# Patient Record
Sex: Female | Born: 1950 | Race: Black or African American | Hispanic: No | Smoking: Former smoker
Health system: Southern US, Community
[De-identification: ages and names within clinical notes are randomized; demographics above are authoritative.]

## PROBLEM LIST (undated history)

## (undated) DIAGNOSIS — E785 Hyperlipidemia, unspecified: Secondary | ICD-10-CM

## (undated) DIAGNOSIS — Z9289 Personal history of other medical treatment: Secondary | ICD-10-CM

## (undated) DIAGNOSIS — M545 Low back pain, unspecified: Secondary | ICD-10-CM

## (undated) DIAGNOSIS — I152 Hypertension secondary to endocrine disorders: Secondary | ICD-10-CM

## (undated) DIAGNOSIS — E119 Type 2 diabetes mellitus without complications: Secondary | ICD-10-CM

## (undated) DIAGNOSIS — I2510 Atherosclerotic heart disease of native coronary artery without angina pectoris: Secondary | ICD-10-CM

## (undated) DIAGNOSIS — J189 Pneumonia, unspecified organism: Secondary | ICD-10-CM

## (undated) DIAGNOSIS — I739 Peripheral vascular disease, unspecified: Secondary | ICD-10-CM

## (undated) DIAGNOSIS — Z9861 Coronary angioplasty status: Secondary | ICD-10-CM

## (undated) DIAGNOSIS — F32A Depression, unspecified: Secondary | ICD-10-CM

## (undated) DIAGNOSIS — N184 Chronic kidney disease, stage 4 (severe): Secondary | ICD-10-CM

## (undated) DIAGNOSIS — I5032 Chronic diastolic (congestive) heart failure: Secondary | ICD-10-CM

## (undated) DIAGNOSIS — I96 Gangrene, not elsewhere classified: Secondary | ICD-10-CM

## (undated) DIAGNOSIS — F329 Major depressive disorder, single episode, unspecified: Secondary | ICD-10-CM

## (undated) DIAGNOSIS — I2119 ST elevation (STEMI) myocardial infarction involving other coronary artery of inferior wall: Secondary | ICD-10-CM

## (undated) DIAGNOSIS — Z9981 Dependence on supplemental oxygen: Secondary | ICD-10-CM

## (undated) DIAGNOSIS — I639 Cerebral infarction, unspecified: Secondary | ICD-10-CM

## (undated) DIAGNOSIS — L97509 Non-pressure chronic ulcer of other part of unspecified foot with unspecified severity: Secondary | ICD-10-CM

## (undated) DIAGNOSIS — E1159 Type 2 diabetes mellitus with other circulatory complications: Secondary | ICD-10-CM

## (undated) DIAGNOSIS — M1990 Unspecified osteoarthritis, unspecified site: Secondary | ICD-10-CM

## (undated) DIAGNOSIS — G8929 Other chronic pain: Secondary | ICD-10-CM

## (undated) DIAGNOSIS — E66813 Obesity, class 3: Secondary | ICD-10-CM

## (undated) DIAGNOSIS — E1142 Type 2 diabetes mellitus with diabetic polyneuropathy: Secondary | ICD-10-CM

## (undated) DIAGNOSIS — Z794 Long term (current) use of insulin: Secondary | ICD-10-CM

## (undated) DIAGNOSIS — I10 Essential (primary) hypertension: Secondary | ICD-10-CM

## (undated) DIAGNOSIS — D649 Anemia, unspecified: Secondary | ICD-10-CM

## (undated) DIAGNOSIS — C539 Malignant neoplasm of cervix uteri, unspecified: Secondary | ICD-10-CM

## (undated) DIAGNOSIS — E11621 Type 2 diabetes mellitus with foot ulcer: Secondary | ICD-10-CM

## (undated) HISTORY — DX: Hypertension secondary to endocrine disorders: I15.2

## (undated) HISTORY — DX: Cerebral infarction, unspecified: I63.9

## (undated) HISTORY — DX: Anemia, unspecified: D64.9

## (undated) HISTORY — DX: Obesity, class 3: E66.813

## (undated) HISTORY — DX: Chronic kidney disease, stage 4 (severe): N18.4

## (undated) HISTORY — DX: Hyperlipidemia, unspecified: E78.5

## (undated) HISTORY — DX: ST elevation (STEMI) myocardial infarction involving other coronary artery of inferior wall: I21.19

## (undated) HISTORY — PX: ABDOMINAL HYSTERECTOMY: SHX81

## (undated) HISTORY — PX: EYE SURGERY: SHX253

## (undated) HISTORY — DX: Chronic diastolic (congestive) heart failure: I50.32

## (undated) HISTORY — DX: Morbid (severe) obesity due to excess calories: E66.01

## (undated) HISTORY — DX: Coronary angioplasty status: Z98.61

## (undated) HISTORY — DX: Peripheral vascular disease, unspecified: I73.9

## (undated) HISTORY — DX: Type 2 diabetes mellitus with foot ulcer: E11.621

## (undated) HISTORY — DX: Type 2 diabetes mellitus with foot ulcer: L97.509

## (undated) HISTORY — PX: LEG TENDON SURGERY: SHX1004

## (undated) HISTORY — DX: Atherosclerotic heart disease of native coronary artery without angina pectoris: I25.10

## (undated) HISTORY — PX: LEG SURGERY: SHX1003

## (undated) HISTORY — DX: Essential (primary) hypertension: I10

## (undated) HISTORY — DX: Type 2 diabetes mellitus with other circulatory complications: E11.59

## (undated) HISTORY — DX: Gangrene, not elsewhere classified: I96

## (undated) HISTORY — DX: Type 2 diabetes mellitus with diabetic polyneuropathy: E11.42

## (undated) HISTORY — PX: TRANSTHORACIC ECHOCARDIOGRAM: SHX275

## (undated) HISTORY — DX: Type 2 diabetes mellitus without complications: Z79.4

## (undated) HISTORY — DX: Type 2 diabetes mellitus without complications: E11.9

---
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
## 2009-03-19 DIAGNOSIS — I2119 ST elevation (STEMI) myocardial infarction involving other coronary artery of inferior wall: Secondary | ICD-10-CM

## 2009-03-19 DIAGNOSIS — I2510 Atherosclerotic heart disease of native coronary artery without angina pectoris: Secondary | ICD-10-CM

## 2009-03-19 HISTORY — DX: ST elevation (STEMI) myocardial infarction involving other coronary artery of inferior wall: I21.19

## 2009-03-19 HISTORY — DX: Atherosclerotic heart disease of native coronary artery without angina pectoris: I25.10

## 2009-03-31 ENCOUNTER — Encounter: Payer: Self-pay | Admitting: Cardiovascular Disease

## 2009-03-31 ENCOUNTER — Inpatient Hospital Stay (HOSPITAL_COMMUNITY): Admission: EM | Admit: 2009-03-31 | Discharge: 2009-04-04 | Payer: Self-pay | Admitting: Emergency Medicine

## 2009-03-31 DIAGNOSIS — I2510 Atherosclerotic heart disease of native coronary artery without angina pectoris: Secondary | ICD-10-CM | POA: Insufficient documentation

## 2009-03-31 DIAGNOSIS — I252 Old myocardial infarction: Secondary | ICD-10-CM | POA: Insufficient documentation

## 2009-03-31 HISTORY — PX: CARDIAC CATHETERIZATION: SHX172

## 2009-03-31 HISTORY — PX: PERCUTANEOUS CORONARY STENT INTERVENTION (PCI-S): SHX6016

## 2009-06-08 ENCOUNTER — Emergency Department (HOSPITAL_COMMUNITY): Admission: EM | Admit: 2009-06-08 | Discharge: 2009-06-08 | Payer: Self-pay | Admitting: Emergency Medicine

## 2010-03-14 ENCOUNTER — Emergency Department (HOSPITAL_COMMUNITY): Admission: EM | Admit: 2010-03-14 | Discharge: 2010-03-14 | Payer: Self-pay | Admitting: Family Medicine

## 2010-06-26 ENCOUNTER — Emergency Department (HOSPITAL_COMMUNITY): Admission: EM | Admit: 2010-06-26 | Discharge: 2010-06-26 | Payer: Self-pay | Admitting: Family Medicine

## 2010-08-30 ENCOUNTER — Emergency Department (HOSPITAL_COMMUNITY): Admission: EM | Admit: 2010-08-30 | Discharge: 2010-08-30 | Payer: Self-pay | Admitting: Family Medicine

## 2010-09-01 ENCOUNTER — Observation Stay (HOSPITAL_COMMUNITY): Admission: EM | Admit: 2010-09-01 | Discharge: 2010-09-04 | Payer: Self-pay | Admitting: Emergency Medicine

## 2010-09-01 ENCOUNTER — Encounter: Payer: Self-pay | Admitting: Cardiovascular Disease

## 2010-10-11 ENCOUNTER — Ambulatory Visit
Admission: RE | Admit: 2010-10-11 | Discharge: 2010-10-11 | Payer: Self-pay | Source: Home / Self Care | Admitting: Gynecology

## 2010-10-18 DIAGNOSIS — J4489 Other specified chronic obstructive pulmonary disease: Secondary | ICD-10-CM | POA: Insufficient documentation

## 2010-10-18 DIAGNOSIS — N390 Urinary tract infection, site not specified: Secondary | ICD-10-CM | POA: Insufficient documentation

## 2010-10-18 DIAGNOSIS — J449 Chronic obstructive pulmonary disease, unspecified: Secondary | ICD-10-CM

## 2010-10-18 DIAGNOSIS — I2510 Atherosclerotic heart disease of native coronary artery without angina pectoris: Secondary | ICD-10-CM | POA: Insufficient documentation

## 2010-10-19 ENCOUNTER — Ambulatory Visit: Payer: Self-pay | Admitting: Cardiovascular Disease

## 2010-10-19 ENCOUNTER — Telehealth (INDEPENDENT_AMBULATORY_CARE_PROVIDER_SITE_OTHER): Payer: Self-pay | Admitting: *Deleted

## 2010-10-19 DIAGNOSIS — R011 Cardiac murmur, unspecified: Secondary | ICD-10-CM

## 2010-10-23 ENCOUNTER — Ambulatory Visit: Payer: Self-pay

## 2010-10-23 ENCOUNTER — Encounter: Payer: Self-pay | Admitting: Cardiovascular Disease

## 2010-10-23 ENCOUNTER — Encounter (HOSPITAL_COMMUNITY)
Admission: RE | Admit: 2010-10-23 | Discharge: 2010-10-23 | Payer: Self-pay | Source: Home / Self Care | Attending: Cardiovascular Disease | Admitting: Cardiovascular Disease

## 2010-10-23 ENCOUNTER — Ambulatory Visit: Payer: Self-pay | Admitting: Cardiovascular Disease

## 2010-10-23 ENCOUNTER — Ambulatory Visit (HOSPITAL_COMMUNITY)
Admission: RE | Admit: 2010-10-23 | Discharge: 2010-10-23 | Payer: Self-pay | Source: Home / Self Care | Admitting: Cardiovascular Disease

## 2010-11-21 ENCOUNTER — Encounter: Payer: Self-pay | Admitting: Obstetrics & Gynecology

## 2010-11-21 ENCOUNTER — Inpatient Hospital Stay (HOSPITAL_COMMUNITY)
Admission: RE | Admit: 2010-11-21 | Discharge: 2010-11-23 | Payer: Self-pay | Source: Home / Self Care | Attending: Obstetrics & Gynecology | Admitting: Obstetrics & Gynecology

## 2010-11-22 LAB — URINALYSIS, ROUTINE W REFLEX MICROSCOPIC
Bilirubin Urine: NEGATIVE
Ketones, ur: NEGATIVE mg/dL
Nitrite: NEGATIVE
Protein, ur: 100 mg/dL — AB
Specific Gravity, Urine: 1.013 (ref 1.005–1.030)
Urine Glucose, Fasting: NEGATIVE mg/dL
Urobilinogen, UA: 0.2 mg/dL (ref 0.0–1.0)
pH: 5 (ref 5.0–8.0)

## 2010-11-22 LAB — GLUCOSE, CAPILLARY
Glucose-Capillary: 144 mg/dL — ABNORMAL HIGH (ref 70–99)
Glucose-Capillary: 175 mg/dL — ABNORMAL HIGH (ref 70–99)
Glucose-Capillary: 181 mg/dL — ABNORMAL HIGH (ref 70–99)
Glucose-Capillary: 199 mg/dL — ABNORMAL HIGH (ref 70–99)
Glucose-Capillary: 247 mg/dL — ABNORMAL HIGH (ref 70–99)
Glucose-Capillary: 304 mg/dL — ABNORMAL HIGH (ref 70–99)

## 2010-11-22 LAB — BASIC METABOLIC PANEL
BUN: 30 mg/dL — ABNORMAL HIGH (ref 6–23)
BUN: 29 mg/dL — ABNORMAL HIGH (ref 6–23)
CO2: 24 mEq/L (ref 19–32)
CO2: 22 mEq/L (ref 19–32)
Calcium: 8.5 mg/dL (ref 8.4–10.5)
Calcium: 8.5 mg/dL (ref 8.4–10.5)
Chloride: 105 mEq/L (ref 96–112)
Chloride: 103 mEq/L (ref 96–112)
Creatinine, Ser: 2.07 mg/dL — ABNORMAL HIGH (ref 0.4–1.2)
Creatinine, Ser: 2.05 mg/dL — ABNORMAL HIGH (ref 0.4–1.2)
GFR calc Af Amer: 30 mL/min — ABNORMAL LOW (ref >60)
GFR calc Af Amer: 30 mL/min — ABNORMAL LOW (ref >60)
GFR calc non Af Amer: 25 mL/min — ABNORMAL LOW (ref >60)
GFR calc non Af Amer: 25 mL/min — ABNORMAL LOW (ref >60)
Glucose, Bld: 224 mg/dL — ABNORMAL HIGH (ref 70–99)
Glucose, Bld: 147 mg/dL — ABNORMAL HIGH (ref 70–99)
Potassium: 4.5 mEq/L (ref 3.5–5.1)
Potassium: 4.3 mEq/L (ref 3.5–5.1)
Sodium: 138 mEq/L (ref 135–145)
Sodium: 135 mEq/L (ref 135–145)

## 2010-11-22 LAB — CBC
HCT: 23.9 % — ABNORMAL LOW (ref 36.0–46.0)
HCT: 26.4 % — ABNORMAL LOW (ref 36.0–46.0)
Hemoglobin: 7.9 g/dL — ABNORMAL LOW (ref 12.0–15.0)
Hemoglobin: 8.7 g/dL — ABNORMAL LOW (ref 12.0–15.0)
MCH: 27.9 pg (ref 26.0–34.0)
MCH: 28 pg (ref 26.0–34.0)
MCHC: 33.1 g/dL (ref 30.0–36.0)
MCHC: 33 g/dL (ref 30.0–36.0)
MCV: 84.5 fL (ref 78.0–100.0)
MCV: 84.9 fL (ref 78.0–100.0)
Platelets: 192 10*3/uL (ref 150–400)
Platelets: 217 10*3/uL (ref 150–400)
RBC: 2.83 MIL/uL — ABNORMAL LOW (ref 3.87–5.11)
RBC: 3.11 MIL/uL — ABNORMAL LOW (ref 3.87–5.11)
RDW: 15.2 % (ref 11.5–15.5)
RDW: 15.4 % (ref 11.5–15.5)
WBC: 11 10*3/uL — ABNORMAL HIGH (ref 4.0–10.5)
WBC: 12.5 10*3/uL — ABNORMAL HIGH (ref 4.0–10.5)

## 2010-11-22 LAB — URINE MICROSCOPIC-ADD ON

## 2010-11-23 LAB — BASIC METABOLIC PANEL
BUN: 29 mg/dL — ABNORMAL HIGH (ref 6–23)
BUN: 24 mg/dL — ABNORMAL HIGH (ref 6–23)
CO2: 21 mEq/L (ref 19–32)
CO2: 23 mEq/L (ref 19–32)
Calcium: 8.2 mg/dL — ABNORMAL LOW (ref 8.4–10.5)
Calcium: 8.6 mg/dL (ref 8.4–10.5)
Chloride: 107 mEq/L (ref 96–112)
Chloride: 110 mEq/L (ref 96–112)
Creatinine, Ser: 1.73 mg/dL — ABNORMAL HIGH (ref 0.4–1.2)
Creatinine, Ser: 1.48 mg/dL — ABNORMAL HIGH (ref 0.4–1.2)
GFR calc Af Amer: 36 mL/min — ABNORMAL LOW (ref >60)
GFR calc Af Amer: 44 mL/min — ABNORMAL LOW (ref >60)
GFR calc non Af Amer: 30 mL/min — ABNORMAL LOW (ref >60)
GFR calc non Af Amer: 36 mL/min — ABNORMAL LOW (ref >60)
Glucose, Bld: 206 mg/dL — ABNORMAL HIGH (ref 70–99)
Glucose, Bld: 120 mg/dL — ABNORMAL HIGH (ref 70–99)
Potassium: 4.6 mEq/L (ref 3.5–5.1)
Potassium: 4.4 mEq/L (ref 3.5–5.1)
Sodium: 134 mEq/L — ABNORMAL LOW (ref 135–145)
Sodium: 136 mEq/L (ref 135–145)

## 2010-11-23 LAB — GLUCOSE, CAPILLARY
Glucose-Capillary: 228 mg/dL — ABNORMAL HIGH (ref 70–99)
Glucose-Capillary: 125 mg/dL — ABNORMAL HIGH (ref 70–99)
Glucose-Capillary: 167 mg/dL — ABNORMAL HIGH (ref 70–99)

## 2010-11-23 LAB — CBC
HCT: 21.8 % — ABNORMAL LOW (ref 36.0–46.0)
Hemoglobin: 7.3 g/dL — ABNORMAL LOW (ref 12.0–15.0)
MCH: 28.5 pg (ref 26.0–34.0)
MCHC: 33.5 g/dL (ref 30.0–36.0)
MCV: 85.2 fL (ref 78.0–100.0)
Platelets: 158 10*3/uL (ref 150–400)
RBC: 2.56 MIL/uL — ABNORMAL LOW (ref 3.87–5.11)
RDW: 15.6 % — ABNORMAL HIGH (ref 11.5–15.5)
WBC: 9.4 10*3/uL (ref 4.0–10.5)

## 2010-11-27 ENCOUNTER — Inpatient Hospital Stay (HOSPITAL_COMMUNITY)
Admission: AD | Admit: 2010-11-27 | Discharge: 2010-11-29 | Payer: Self-pay | Attending: Obstetrics & Gynecology | Admitting: Obstetrics & Gynecology

## 2010-11-27 ENCOUNTER — Emergency Department (HOSPITAL_COMMUNITY)
Admission: EM | Admit: 2010-11-27 | Discharge: 2010-11-27 | Payer: Self-pay | Source: Home / Self Care | Admitting: Emergency Medicine

## 2010-12-04 LAB — PROTIME-INR
INR: 1.02 (ref 0.00–1.49)
Prothrombin Time: 13.6 seconds (ref 11.6–15.2)

## 2010-12-04 LAB — URINE MICROSCOPIC-ADD ON

## 2010-12-04 LAB — APTT: aPTT: 36 seconds (ref 24–37)

## 2010-12-04 LAB — COMPREHENSIVE METABOLIC PANEL
ALT: 13 U/L (ref 0–35)
AST: 13 U/L (ref 0–37)
Albumin: 2.6 g/dL — ABNORMAL LOW (ref 3.5–5.2)
Alkaline Phosphatase: 101 U/L (ref 39–117)
BUN: 11 mg/dL (ref 6–23)
CO2: 27 mEq/L (ref 19–32)
Calcium: 9.1 mg/dL (ref 8.4–10.5)
Chloride: 105 mEq/L (ref 96–112)
Creatinine, Ser: 1.08 mg/dL (ref 0.4–1.2)
GFR calc Af Amer: >60 mL/min (ref >60)
GFR calc non Af Amer: 52 mL/min — ABNORMAL LOW (ref >60)
Glucose, Bld: 129 mg/dL — ABNORMAL HIGH (ref 70–99)
Potassium: 4.2 mEq/L (ref 3.5–5.1)
Sodium: 139 mEq/L (ref 135–145)
Total Bilirubin: 1.5 mg/dL — ABNORMAL HIGH (ref 0.3–1.2)
Total Protein: 6.5 g/dL (ref 6.0–8.3)

## 2010-12-04 LAB — CBC
HCT: 21.1 % — ABNORMAL LOW (ref 36.0–46.0)
HCT: 22.6 % — ABNORMAL LOW (ref 36.0–46.0)
HCT: 25.6 % — ABNORMAL LOW (ref 36.0–46.0)
Hemoglobin: 7 g/dL — ABNORMAL LOW (ref 12.0–15.0)
Hemoglobin: 7.4 g/dL — ABNORMAL LOW (ref 12.0–15.0)
Hemoglobin: 8.4 g/dL — ABNORMAL LOW (ref 12.0–15.0)
MCH: 27.7 pg (ref 26.0–34.0)
MCH: 27.6 pg (ref 26.0–34.0)
MCH: 27.7 pg (ref 26.0–34.0)
MCHC: 33.2 g/dL (ref 30.0–36.0)
MCHC: 32.7 g/dL (ref 30.0–36.0)
MCHC: 32.8 g/dL (ref 30.0–36.0)
MCV: 83.4 fL (ref 78.0–100.0)
MCV: 84.3 fL (ref 78.0–100.0)
MCV: 84.5 fL (ref 78.0–100.0)
Platelets: 227 10*3/uL (ref 150–400)
Platelets: 235 10*3/uL (ref 150–400)
Platelets: 198 10*3/uL (ref 150–400)
RBC: 2.53 MIL/uL — ABNORMAL LOW (ref 3.87–5.11)
RBC: 2.68 MIL/uL — ABNORMAL LOW (ref 3.87–5.11)
RBC: 3.03 MIL/uL — ABNORMAL LOW (ref 3.87–5.11)
RDW: 15.6 % — ABNORMAL HIGH (ref 11.5–15.5)
RDW: 15.9 % — ABNORMAL HIGH (ref 11.5–15.5)
RDW: 15.5 % (ref 11.5–15.5)
WBC: 9 10*3/uL (ref 4.0–10.5)
WBC: 9.8 10*3/uL (ref 4.0–10.5)
WBC: 8.3 10*3/uL (ref 4.0–10.5)

## 2010-12-04 LAB — URINALYSIS, ROUTINE W REFLEX MICROSCOPIC
Bilirubin Urine: NEGATIVE
Ketones, ur: NEGATIVE mg/dL
Nitrite: NEGATIVE
Protein, ur: 100 mg/dL — AB
Specific Gravity, Urine: 1.019 (ref 1.005–1.030)
Urine Glucose, Fasting: NEGATIVE mg/dL
Urobilinogen, UA: 1 mg/dL (ref 0.0–1.0)
pH: 5.5 (ref 5.0–8.0)

## 2010-12-04 LAB — TYPE AND SCREEN
ABO/RH(D): O POS
Antibody Screen: NEGATIVE
Unit division: 0
Unit division: 0

## 2010-12-04 LAB — POCT CARDIAC MARKERS
CKMB, poc: <1 ng/mL — ABNORMAL LOW (ref 1.0–8.0)
Myoglobin, poc: 94.8 ng/mL (ref 12–200)
Troponin i, poc: <0.05 ng/mL (ref 0.00–0.09)

## 2010-12-04 LAB — DIFFERENTIAL
Basophils Absolute: 0 10*3/uL (ref 0.0–0.1)
Basophils Relative: 0 % (ref 0–1)
Eosinophils Absolute: 0.3 10*3/uL (ref 0.0–0.7)
Eosinophils Relative: 3 % (ref 0–5)
Lymphocytes Relative: 27 % (ref 12–46)
Lymphs Abs: 2.4 10*3/uL (ref 0.7–4.0)
Monocytes Absolute: 1.2 10*3/uL — ABNORMAL HIGH (ref 0.1–1.0)
Monocytes Relative: 14 % — ABNORMAL HIGH (ref 3–12)
Neutro Abs: 5.1 10*3/uL (ref 1.7–7.7)
Neutrophils Relative %: 57 % (ref 43–77)

## 2010-12-04 LAB — GLUCOSE, CAPILLARY
Glucose-Capillary: 108 mg/dL — ABNORMAL HIGH (ref 70–99)
Glucose-Capillary: 116 mg/dL — ABNORMAL HIGH (ref 70–99)
Glucose-Capillary: 116 mg/dL — ABNORMAL HIGH (ref 70–99)
Glucose-Capillary: 122 mg/dL — ABNORMAL HIGH (ref 70–99)
Glucose-Capillary: 132 mg/dL — ABNORMAL HIGH (ref 70–99)
Glucose-Capillary: 140 mg/dL — ABNORMAL HIGH (ref 70–99)
Glucose-Capillary: 64 mg/dL — ABNORMAL LOW (ref 70–99)
Glucose-Capillary: 75 mg/dL (ref 70–99)
Glucose-Capillary: 80 mg/dL (ref 70–99)
Glucose-Capillary: 81 mg/dL (ref 70–99)
Glucose-Capillary: 90 mg/dL (ref 70–99)
Glucose-Capillary: 109 mg/dL — ABNORMAL HIGH (ref 70–99)
Glucose-Capillary: 80 mg/dL (ref 70–99)

## 2010-12-04 LAB — CULTURE, BLOOD (ROUTINE X 2)
Culture  Setup Time: 201201091656
Culture  Setup Time: 201201091656
Culture: NO GROWTH
Culture: NO GROWTH

## 2010-12-04 LAB — PREPARE RBC (CROSSMATCH)

## 2010-12-04 LAB — ABO/RH: ABO/RH(D): O POS

## 2010-12-04 LAB — LIPASE, BLOOD: Lipase: 24 U/L (ref 11–59)

## 2010-12-13 NOTE — Discharge Summary (Addendum)
  [name], [name]               ACCOUNT NO.:  [id number]  MEDICAL RECORD NO.:  [id number]          PATIENT TYPE:  INP  LOCATION:  1341                         FACILITY:  [facility]  PHYSICIAN:  Agnes Lawrence, M.D.DATE OF BIRTH:  05/18/1951  DATE OF ADMISSION:  11/21/2010 DATE OF DISCHARGE:  11/23/2010                              DISCHARGE SUMMARY   CHIEF COMPLAINT:  The patient is a 60 year old who presents with a newly diagnosed endometrial cancer for operative management.  Please see the dictated history and physical.  HOSPITAL COURSE:  The patient was admitted and underwent a robotic laparoscopic hysterectomy, bilateral salpingo-oophorectomy, left pelvic lymph node dissection and lysis of adhesions.  Please see the dictated operative report for findings.  On postoperative day one, a hemoglobin was 7.9 which was down from 11.2 preoperatively.  A creatinine was 2.07 which was up from 1.04 preoperatively as well, and the patient had had low urine output overnight.  This anemia was felt to be secondary to acute blood loss.  However, she remained hemodynamically stable.  There was also felt to be some intravascular volume depletion.  There was hyperglycemia with an elevated serum glucose of 224 which was felt to be consistent with the recent stress of surgery.  The patient was given a fluid challenge.  Intake and output were measured.  A sliding scale insulin coverage was continued.  On postoperative day two, the urine output had improved.  The renal function had improved after IV hydration.  She was tolerating a regular diet and was discharged to home.  DISCHARGE DIAGNOSIS: 1. Stage IA endometrial cancer. 2. Anemia secondary to blood loss.  PROCEDURE: 1. Robotic laparoscopic hysterectomy and bilateral salpingo-     oophorectomy. 2. Left pelvic lymph node dissection and lysis of adhesions.  CONDITION:  Stable.  DISCHARGE DIET:  ADA diet.  DISCHARGE MEDICATIONS:   Included - 1. Aspirin. 2. Glipizide. 3. Carvedilol. 4. Losartan. 5. Arthrotec. 6. Lantus insulin. 7. Nitroglycerin as needed. 8. Glucophage. 9. Simvastatin. 10.Percocet 3/325 one to two tablets every six hours as needed.  DISPOSITION:  The patient was to follow up in the GYN Oncology office.     Agnes Lawrence, M.D.     LAJ/MEDQ  D:  12/09/2010  T:  12/09/2010  Job:  [id number]  cc:   Caswell Corwin, R.N. [address], [place] [address]  Electronically Signed by Lahoma Crocker M.D. on 12/13/2010 09:09:41 PM

## 2010-12-19 NOTE — Assessment & Plan Note (Signed)
Summary: NP3/SURGICAL CLEARANCE/ENDOMETERIAL CANCER/ ** NOTES IN CHART **   History of Present Illness: [name] is seen today at the request of Dr Marvel Plan for clearance.  She has known CAD.  She was ared for by Dr Einar Gip in May 2010 for IMI.  ? Bicuspid AV as well.  No significant disease in circumflex or LAD.  Overall EF ok.  Seen in ER for abdominal pain 10/11 and CT showed possible uterine mass.  Biopsy confirms CA.  Needs hysterectomy using minimally invasive approach.  No evidence of metastatic disease.  She takes poor care of herself and apparantly couldnt F/U with Dr Einar Gip for financial reasons.  Her BS is poorly controlled and she is overweight.  She does not appear to have taken Plavix very long and was not taking any ASA recently.  Denies any recurrent SSCP.  Surgery has not been scheduled yet Has sedentary lifestyle and mild chronic exertional dyspnea  Current Problems (verified): 1)  Cardiac Murmur  (ICD-785.2) 2)  Cad  (ICD-414.00) 3)  Hypertension  (ICD-401.9) 4)  Uti  (ICD-599.0) 5)  Dm  (ICD-250.00) 6)  COPD  (ICD-496)  Current Medications (verified): 1)  Nitrostat 0.4 Mg Subl (Nitroglycerin) .Marland Kitchen.. 1 Tablet Under Tongue At Onset of Chest Pain; You May Repeat Every 5 Minutes For Up To 3 Doses. 2)  Lantus 100 Unit/ml Soln (Insulin Glargine) .... 30 Units At Bedtime 3)  Glucophage 1000 Mg Tabs (Metformin Hcl) .Marland Kitchen.. 1 Tab By Mouth Two Times A Day 4)  Glucotrol 10 Mg Tabs (Glipizide) .Marland Kitchen.. 1 Tab By Mouth Once Daily 5)  Simvastatin 80 Mg Tabs (Simvastatin) .... Take One Tablet By Mouth Daily At Bedtime 6)  Carvedilol 3.125 Mg Tabs (Carvedilol) .Marland Kitchen.. 1 Tab By Mouth Two Times A Day 7)  Arthrotec 75-200 Mg-Mcg Tabs (Diclofenac-Misoprostol) .Marland Kitchen.. 1 Tab By Mouth Two Times A Day  Allergies (verified): No Known Drug Allergies  Past History:  Past Medical History: Last updated: 10/18/2010 CAD : IMI 5/10 stent proximal RCA [place] HYPERTENSION UTI  DM  COPD   Cervical cyst  Past  Surgical History: Last updated: 10/18/2010 Materials used was a 7-French FR-4 guide with side hole, ASAHI Prowater guidewire, ReoPro, ClearWay catheter followed by a intracoronary 120 mcg of adenosine and intracoronary ReoPro followed by stenting with 3.4- x 24-mm driver stent, nondrug eluting.    Right knee ligament repair and surgery of the  muscles and tendons of the left leg.   Family History: Last updated: 10/19/2010  Negative for gynecologic, breast, or colon cancer.    Alcoholism in both parents  Social History: Last updated: 10/19/2010  The patient lives with her daughters.  She does not   smoke.  She is retired and widowed.  Moved here from [place] in 2006     Family History:  Negative for gynecologic, breast, or colon cancer.    Alcoholism in both parents  Social History:  The patient lives with her daughters.  She does not   smoke.  She is retired and widowed.  Moved here from [place] in 2006     Review of Systems       Denies fever, malais, weight loss, blurry vision, decreased visual acuity, cough, sputum, hemoptysis, pleuritic pain, palpitaitons, heartburn, abdominal pain, melena, lower extremity edema, claudication, or rash.   Vital Signs:  Patient profile:   60 year old female Weight:      217 pounds Pulse rate:   84 / minute Resp:     14 per minute BP  sitting:   150 / 80  (left arm)  Vitals Entered By: Burnett Kanaris (October 19, 2010 2:48 PM)  Physical Exam  General:  Affect appropriate Healthy:  appears stated age 60: normal Neck supple with no adenopathy JVP normal no bruits no thyromegaly Lungs clear with no wheezing and good diaphragmatic motion Heart:  99991111 systolic murmur no ,rub, gallop or click PMI normal Abdomen: benighn, BS positve, no tenderness, no AAA no bruit.  No HSM or HJR Distal pulses intact with no bruits No edema Neuro non-focal Skin warm and dry    Impression & Recommendations:  Problem # 1:  PREOPERATIVE EXAMINATION  (ICD-V72.84) Moderate risk for surgery.  Poor F/U care post MI with DES to RCA.  F/U Lexiscan myovue.  Will likely need insulin coverage in perioperative phase.  Increase coreg to maximize medical Rx prior to surgery   Problem # 2:  CARDIAC MURMUR (ICD-785.2) No F/U echo after MI.  ? bicuspid vavle  Echo to assess EF and vavle Her updated medication list for this problem includes:    Nitrostat 0.4 Mg Subl (Nitroglycerin) .Marland Kitchen... 1 tablet under tongue at onset of chest pain; you may repeat every 5 minutes for up to 3 doses.    Carvedilol 6.25 Mg Tabs (Carvedilol) .Marland Kitchen... Take one tablet by mouth twice a day    Losartan Potassium 25 Mg Tabs (Losartan potassium) ..... One tablet by mouth once daily  Orders: Echocardiogram (Echo)  Problem # 3:  CAD (ICD-414.00) IMI with stent  Take ASA until Dr Marvel Plan tells her to stop preop.  No chest pain is encouraging but with DM needs myovue to clear for surgery Her updated medication list for this problem includes:    Nitrostat 0.4 Mg Subl (Nitroglycerin) .Marland Kitchen... 1 tablet under tongue at onset of chest pain; you may repeat every 5 minutes for up to 3 doses.    Carvedilol 6.25 Mg Tabs (Carvedilol) .Marland Kitchen... Take one tablet by mouth twice a day  Orders: Nuclear Stress Test (Nuc Stress Test)  Problem # 4:  HYPERTENSION (ICD-401.9) Borderline control  Not on ARB or ACE with DM  Will add Cozaar 25 mg  Her updated medication list for this problem includes:    Carvedilol 6.25 Mg Tabs (Carvedilol) .Marland Kitchen... Take one tablet by mouth twice a day    Losartan Potassium 25 Mg Tabs (Losartan potassium) ..... One tablet by mouth once daily  Problem # 5:  DM (ICD-250.00) F/U Dr Glade Lloyd  Poor control  Discussed low carb diet and start ARB Her updated medication list for this problem includes:    Lantus 100 Unit/ml Soln (Insulin glargine) .Marland KitchenMarland KitchenMarland KitchenMarland Kitchen 30 units at bedtime    Glucophage 1000 Mg Tabs (Metformin hcl) .Marland Kitchen... 1 tab by mouth two times a day    Glucotrol 10 Mg Tabs  (Glipizide) .Marland Kitchen... 1 tab by mouth once daily    Losartan Potassium 25 Mg Tabs (Losartan potassium) ..... One tablet by mouth once daily  Patient Instructions: 1)  Your physician wants you to follow-up in: [place] will receive a reminder letter in the mail two months in advance. If you don't receive a letter, please call our office to schedule the follow-up appointment. 2)  Your physician has requested that you have an echocardiogram.  Echocardiography is a painless test that uses sound waves to create images of your heart. It provides your doctor with information about the size and shape of your heart and how well your heart's chambers and valves are  working.  This procedure takes approximately one hour. There are no restrictions for this procedure. 3)  Your physician has requested that you have an [place].  For further information please visit [email or web address].  Please follow instruction sheet, as given. 4)  Your physician has recommended you make the following change in your medication: INCREASE CARVEDALOL 6.25MG  TWICE DAILY 5)  START LOSARTAN 25MG  ONCE DAILY Prescriptions: LOSARTAN POTASSIUM 25 MG TABS (LOSARTAN POTASSIUM) ONE TABLET by mouth once daily  #30 x 12   Entered by:   Fredia Beets, RN   Authorized by:   Bosie Clos, MD, [facility]   Signed by:   Fredia Beets, RN on 10/19/2010   Method used:   Electronically to        [id number] (retail)       [place], [place]  [address]       Ph: [phone]       Fax: [phone]   RxIDWX:[id number] CARVEDILOL 6.25 MG TABS (CARVEDILOL) Take one tablet by mouth twice a day  #60 x 12   Entered by:   Fredia Beets, RN   Authorized by:   Bosie Clos, MD, [facility]   Signed by:   Fredia Beets, RN on 10/19/2010   Method used:   Electronically to        [facility] (retail)       [address] [place],  [place]  [address]       Ph: [phone]       Fax: [phone]   RxID:   [id number]    EKG Report  Procedure date:  09/01/2010  Findings:      NSR 88 Nonspecific ST/T wave changes inferolateral Abnormal ECG

## 2010-12-19 NOTE — Cardiovascular Report (Signed)
Summary: [facility]  [place]   Imported By: Marilynne Drivers 10/18/2010 14:58:33  _____________________________________________________________________  External Attachment:    Type:   Image     Comment:   External Document

## 2010-12-19 NOTE — Assessment & Plan Note (Signed)
Summary: Cardiology Nuclear Testing  Nuclear Med Background Indications for Stress Test: Evaluation for Ischemia, Surgical Clearance  Indications Comments: Pending Uterine surgery by Dr.  Marvel Plan  History: COPD, Heart Catheterization, Myocardial Infarction, Stents  History Comments: '10 IWMI>Stent-RCA  Symptoms: DOE    Nuclear Pre-Procedure Cardiac Risk Factors: History of Smoking, Hypertension, IDDM Type 2, Lipids, Obesity Caffeine/Decaff Intake: None NPO After: 7:00 PM Lungs: Clear. O2 Sat 99% on RA. IV 0.9% NS with Angio Cath: 20g     IV Site: R Antecubital IV Started by: Irven Baltimore, RN Chest Size (in) 44     Cup Size D     Height (in): 62 Weight (lb): 215 BMI: 39.47 Tech Comments: Held carvedilol this am.  Nuclear Med Study 1 or 2 day study:  1 day     Stress Test Type:  Treadmill/Lexiscan Reading MD:  Jenkins Rouge, MD     Referring MD:  Jenkins Rouge, MD Resting Radionuclide:  Technetium 26m Tetrofosmin     Resting Radionuclide Dose:  11 mCi  Stress Radionuclide:  Technetium 57m Tetrofosmin     Stress Radionuclide Dose:  33 mCi   Stress Protocol Exercise Time (min):  2:00 min     Max HR:  111 bpm     Predicted Max HR:  Q000111Q bpm  Max Systolic BP: 99991111 mm Hg     Percent Max HR:  68.94 %Rate Pressure Product:  21201  Lexiscan: 0.4 mg   Stress Test Technologist:  Valetta Fuller, CMA-N     Nuclear Technologist:  Charlton Amor, CNMT  Rest Procedure  Myocardial perfusion imaging was performed at rest 45 minutes following the intravenous administration of Technetium 85m Tetrofosmin.  Stress Procedure  The patient received IV Lexiscan 0.4 mg over 15-seconds with concurrent low level exercise and then Technetium 51m Tetrofosmin was injected at 30-seconds.  There were no significant changes with infusion, only occasional PVC's.  Quantitative spect images were obtained after a 45 minute delay.  QPS Raw Data Images:  Normal; no motion artifact; normal heart/lung  ratio. Stress Images:  Decrease inferior uptake Rest Images:  Decrease inferior uptake Subtraction (SDS):  SDS 5 Transient Ischemic Dilatation:  1.03  (Normal <1.22)  Lung/Heart Ratio:  0.32  (Normal <0.45)  Quantitative Gated Spect Images QGS EDV:  56 ml QGS ESV:  22 ml QGS EF:  61 % QGS cine images:  Inferobasal hypokinesis  Findings Low risk nuclear study  Evidence for inferior infarct     Overall Impression  Exercise Capacity: Lexiscan with no exercise. BP Response: Normal blood pressure response. Clinical Symptoms: No chest pain ECG Impression: No significant ST segment change suggestive of ischemia. 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

## 2010-12-19 NOTE — Progress Notes (Signed)
Summary: Nuc Pre-Procedure  Phone Note Outgoing Call   Call placed by: Hubbard Robinson RN,  October 19, 2010 4:43 PM Call placed to: Patient Reason for Call: Confirm/change Appt Summary of Call: Left message with information on Myoview Information Sheet (see scanned document for details).      Nuclear Med Background Indications for Stress Test: Evaluation for Ischemia, Surgical Clearance  Indications Comments: Pre-op Dr. Marvel Plan - Uterine Cancer   History: COPD, Myocardial Infarction, Stents  History Comments: 2010 Inferior MI. Stent RCA     Nuclear Pre-Procedure Cardiac Risk Factors: Hypertension, IDDM Type 2, Lipids

## 2010-12-27 ENCOUNTER — Ambulatory Visit: Payer: MEDICARE | Attending: Gynecologic Oncology | Admitting: Gynecologic Oncology

## 2010-12-27 ENCOUNTER — Encounter: Payer: Self-pay | Admitting: Cardiovascular Disease

## 2010-12-27 DIAGNOSIS — C549 Malignant neoplasm of corpus uteri, unspecified: Secondary | ICD-10-CM | POA: Insufficient documentation

## 2010-12-29 NOTE — Consult Note (Signed)
[name], [name]               ACCOUNT NO.:  [id number]  MEDICAL RECORD NO.:  [id number]          PATIENT TYPE:  INP  LOCATION:  9319                          FACILITY:  [facility]  PHYSICIAN:  Bryton Romagnoli A. Alycia Rossetti, MD    DATE OF BIRTH:  04-23-1951  DATE OF CONSULTATION:  12/27/2010 DATE OF DISCHARGE:  11/29/2010                                CONSULTATION   HISTORY OF PRESENT ILLNESS:  [name] is a 60 year old with multiple medical problems including COPD, history of an MI and diabetes who was diagnosed with a grade 1 endometrioid adenocarcinoma by Dr. Paula Compton.  After obtaining cardiac risk stratification and she was cleared for surgery on January 3, she underwent robotic hysterectomy, BSO, left pelvic lymph node dissection and lysis of adhesions. Operative findings included omentum that was adherent to the abdominal wall.  She did fairly well postoperatively.  She did have some issues with low urine output and was in the hospital for 2 days.  She was then discharged on January 5 and readmitted on January 9.  She denied any pain.  She had significant constipation.  Preoperative hemoglobin was 11 and it trended down to 7.3 on the day of discharge.  At that time she was hemodynamically stable.  When she came back complaining of weakness, dizziness, decrease in her vision and bleeding at her incision, the findings included a large ecchymosis anteriorly involving the left flank.  The floor of the incisions were intact.  The supraumbilical incision had a superficial separation and small serous drainage that was pink-tinged.  Albumin was 2.6.  Troponin and CKs were negative. Hemoglobin was 7, which was stable to her postoperative state.  CT scan revealed a 7.8 x 7.6 x 5.2 cm left pelvic hematoma which was near the urinary bladder.  She was admitted to the hospital and given a transfusion of 2 units, which she tolerated well and then she was discharged home.  Since that time she has  continued to complain of constipation.  It has gotten better.  She did use some magnesium citrate.  She did have some gas pains, but those are also better.  She denies any vaginal bleeding.  She does overall feel somewhat weak and tired.  She has some shortness of breath, which is new.  She denies any chest pain or diaphoresis associated with this.  She is also complaining of right-sided ear pain for the last 3 days.  She states that her grandson got her sick.  She is eating well.  She is out of bed. Regarding her pathology, she had a grade 1 endometrioid adenocarcinoma that was limited to the endometrium.  The cervix was negative.  She had fibroids, benign ovaries and tubes, 0 out of 7 lymph nodes and negative washings.  PHYSICAL EXAMINATION:  VITAL SIGNS:  Height 5 feet 1 inch.  Weight 215 pounds.  Blood pressure 158/82, pulse 90, respirations 18. GENERAL:  A well-nourished, well-developed female in no acute distress. HEENT:  Ears were evaluated.  The tympanic membranes were clear.  There was no fluid or bulging or erythema. ABDOMEN:  Shows well-healed surgical incisions.  The ecchymosis is completely resolved. PELVIC:  External genitalia is within normal limits.  The vaginal cuff is visualized.  The suture line is intact.  Bimanual examination is limited by habitus, but there are no masses appreciated.  ASSESSMENT AND PLAN: 20. A 60 year old with multiple medical problems status post robotic     hysterectomy for an early stage endometrial carcinoma who had     postoperative issues with anemia, but appears to be doing better.     They have several questions regarding what to do with her insulin.     She has been on Lantus 30 units at night.  I discussed with her     that she needs to contact her primary care physician regarding     this.  This was started for her in October and she states that she     was only supposed to take it for 30 days.  However, she has been     getting  refills and she is not sure if she needs to be continuing     to take it or not.  We have contacted her primary care physician     for her regarding this. 2. She will see [place] in 6 months and then return to see Dr. Marvel Plan 6     months after that and will begin alternating visits so she is seen     every 6 months by one of [place] for a total of 5 years. 3. She was encouraged to slowly build up her activity level.  If the     chest pain and shortness of breath get worse, she was encouraged to     see Dr. Johnsie Cancel, her cardiologist, for evaluation.  I do not believe     she needs an acute evaluation today.     [name] A. Alycia Rossetti, MD     PAG/MEDQ  D:  12/27/2010  T:  12/27/2010  Job:  [id number]  cc:   Paula Compton, M.D. Fax: [phone]  Agnes Lawrence, M.D. Fax: [phone]  [name], M.D. Fax: [phone]  [address] Johnsie Cancel, [place], [address] [place] [address]  Electronically Signed by Nancy Marus MD on 12/29/2010 09:52:39 AM

## 2011-01-10 NOTE — Consult Note (Signed)
Summary: [facility] Consultation Report  [facility] Consultation Report   Imported By: Roddie Mc 01/02/2011 17:37:38  _____________________________________________________________________  External Attachment:    Type:   Image     Comment:   External Document

## 2011-01-23 ENCOUNTER — Encounter (INDEPENDENT_AMBULATORY_CARE_PROVIDER_SITE_OTHER): Payer: Self-pay | Admitting: *Deleted

## 2011-01-29 LAB — GLUCOSE, CAPILLARY
Glucose-Capillary: 209 mg/dL — ABNORMAL HIGH (ref 70–99)
Glucose-Capillary: 245 mg/dL — ABNORMAL HIGH (ref 70–99)
Glucose-Capillary: 270 mg/dL — ABNORMAL HIGH (ref 70–99)
Glucose-Capillary: 312 mg/dL — ABNORMAL HIGH (ref 70–99)

## 2011-01-29 LAB — CBC
Hemoglobin: 11.2 g/dL — ABNORMAL LOW (ref 12.0–15.0)
MCH: 28.3 pg (ref 26.0–34.0)
MCV: 84.8 fL (ref 78.0–100.0)
Platelets: 253 10*3/uL (ref 150–400)
RBC: 3.96 MIL/uL (ref 3.87–5.11)
WBC: 8.7 10*3/uL (ref 4.0–10.5)

## 2011-01-29 LAB — COMPREHENSIVE METABOLIC PANEL
AST: 16 U/L (ref 0–37)
Albumin: 3.4 g/dL — ABNORMAL LOW (ref 3.5–5.2)
Alkaline Phosphatase: 96 U/L (ref 39–117)
BUN: 24 mg/dL — ABNORMAL HIGH (ref 6–23)
CO2: 26 mEq/L (ref 19–32)
Chloride: 107 mEq/L (ref 96–112)
GFR calc Af Amer: >60 mL/min (ref >60)
GFR calc non Af Amer: 54 mL/min — ABNORMAL LOW (ref >60)
Potassium: 4 mEq/L (ref 3.5–5.1)
Total Bilirubin: 0.5 mg/dL (ref 0.3–1.2)

## 2011-01-29 LAB — DIFFERENTIAL
Basophils Absolute: 0 10*3/uL (ref 0.0–0.1)
Basophils Relative: 0 % (ref 0–1)
Eosinophils Relative: 4 % (ref 0–5)
Monocytes Absolute: 0.6 10*3/uL (ref 0.1–1.0)

## 2011-01-29 LAB — TYPE AND SCREEN: ABO/RH(D): O POS

## 2011-01-30 NOTE — Miscellaneous (Signed)
Summary: Lantus Refill Request  Clinical Lists Changes  Faxed refill request back to [facility]. Requesting pharmacy to contact pts PCP (Dr. Marvel Plan) for Lantus Solostar 100U/ML.  [place], [place]  January 23, 2011 10:18 AM

## 2011-01-31 LAB — BASIC METABOLIC PANEL
BUN: 20 mg/dL (ref 6–23)
BUN: 22 mg/dL (ref 6–23)
BUN: 27 mg/dL — ABNORMAL HIGH (ref 6–23)
CO2: 26 mEq/L (ref 19–32)
CO2: 28 mEq/L (ref 19–32)
Calcium: 9 mg/dL (ref 8.4–10.5)
Calcium: 9.5 mg/dL (ref 8.4–10.5)
Chloride: 106 mEq/L (ref 96–112)
Creatinine, Ser: 1.17 mg/dL (ref 0.4–1.2)
GFR calc Af Amer: 57 mL/min — ABNORMAL LOW (ref >60)
GFR calc non Af Amer: 38 mL/min — ABNORMAL LOW (ref >60)
GFR calc non Af Amer: 47 mL/min — ABNORMAL LOW (ref >60)
Glucose, Bld: 239 mg/dL — ABNORMAL HIGH (ref 70–99)
Glucose, Bld: 340 mg/dL — ABNORMAL HIGH (ref 70–99)
Potassium: 4.1 mEq/L (ref 3.5–5.1)
Sodium: 136 mEq/L (ref 135–145)

## 2011-01-31 LAB — CBC
HCT: 32.9 % — ABNORMAL LOW (ref 36.0–46.0)
HCT: 34 % — ABNORMAL LOW (ref 36.0–46.0)
Hemoglobin: 10.9 g/dL — ABNORMAL LOW (ref 12.0–15.0)
MCH: 28.2 pg (ref 26.0–34.0)
MCH: 28.2 pg (ref 26.0–34.0)
MCHC: 33.1 g/dL (ref 30.0–36.0)
MCHC: 33.2 g/dL (ref 30.0–36.0)
MCV: 83.9 fL (ref 78.0–100.0)
MCV: 85 fL (ref 78.0–100.0)
Platelets: 177 10*3/uL (ref 150–400)
RBC: 3.78 MIL/uL — ABNORMAL LOW (ref 3.87–5.11)
RDW: 15 % (ref 11.5–15.5)
RDW: 15.1 % (ref 11.5–15.5)
RDW: 15.1 % (ref 11.5–15.5)
WBC: 5.8 10*3/uL (ref 4.0–10.5)

## 2011-01-31 LAB — DIFFERENTIAL
Basophils Absolute: 0 10*3/uL (ref 0.0–0.1)
Eosinophils Absolute: 0.2 10*3/uL (ref 0.0–0.7)
Eosinophils Relative: 4 % (ref 0–5)
Lymphocytes Relative: 49 % — ABNORMAL HIGH (ref 12–46)
Lymphs Abs: 2.9 10*3/uL (ref 0.7–4.0)
Neutrophils Relative %: 38 % — ABNORMAL LOW (ref 43–77)

## 2011-01-31 LAB — GLUCOSE, CAPILLARY
Glucose-Capillary: 225 mg/dL — ABNORMAL HIGH (ref 70–99)
Glucose-Capillary: 302 mg/dL — ABNORMAL HIGH (ref 70–99)
Glucose-Capillary: 308 mg/dL — ABNORMAL HIGH (ref 70–99)
Glucose-Capillary: 321 mg/dL — ABNORMAL HIGH (ref 70–99)
Glucose-Capillary: 379 mg/dL — ABNORMAL HIGH (ref 70–99)

## 2011-01-31 LAB — HEMOGLOBIN A1C
Hgb A1c MFr Bld: 13.8 % — ABNORMAL HIGH (ref <5.7)
Mean Plasma Glucose: 349 mg/dL — ABNORMAL HIGH (ref <117)

## 2011-02-01 LAB — COMPREHENSIVE METABOLIC PANEL
AST: 13 U/L (ref 0–37)
BUN: 20 mg/dL (ref 6–23)
CO2: 26 mEq/L (ref 19–32)
Chloride: 101 mEq/L (ref 96–112)
Creatinine, Ser: 1.07 mg/dL (ref 0.4–1.2)
GFR calc non Af Amer: 52 mL/min — ABNORMAL LOW (ref >60)
Glucose, Bld: 380 mg/dL — ABNORMAL HIGH (ref 70–99)
Total Bilirubin: 0.9 mg/dL (ref 0.3–1.2)

## 2011-02-01 LAB — DIFFERENTIAL
Basophils Absolute: 0 10*3/uL (ref 0.0–0.1)
Eosinophils Relative: 3 % (ref 0–5)
Lymphocytes Relative: 44 % (ref 12–46)
Monocytes Absolute: 0.6 10*3/uL (ref 0.1–1.0)

## 2011-02-01 LAB — URINE CULTURE: Culture  Setup Time: 201110150010

## 2011-02-01 LAB — CBC
HCT: 36.1 % (ref 36.0–46.0)
Hemoglobin: 12.2 g/dL (ref 12.0–15.0)
MCH: 28.4 pg (ref 26.0–34.0)
MCV: 84.1 fL (ref 78.0–100.0)
RBC: 4.29 MIL/uL (ref 3.87–5.11)

## 2011-02-01 LAB — URINE MICROSCOPIC-ADD ON

## 2011-02-01 LAB — URINALYSIS, ROUTINE W REFLEX MICROSCOPIC
Bilirubin Urine: NEGATIVE
Ketones, ur: NEGATIVE mg/dL
Specific Gravity, Urine: 1.028 (ref 1.005–1.030)
pH: 5 (ref 5.0–8.0)

## 2011-02-27 LAB — COMPREHENSIVE METABOLIC PANEL
AST: 116 U/L — ABNORMAL HIGH (ref 0–37)
Albumin: 3.4 g/dL — ABNORMAL LOW (ref 3.5–5.2)
Calcium: 9 mg/dL (ref 8.4–10.5)
Creatinine, Ser: 1.07 mg/dL (ref 0.4–1.2)
GFR calc Af Amer: >60 mL/min (ref >60)
Total Protein: 7 g/dL (ref 6.0–8.3)

## 2011-02-27 LAB — BASIC METABOLIC PANEL
BUN: 24 mg/dL — ABNORMAL HIGH (ref 6–23)
BUN: 19 mg/dL (ref 6–23)
CO2: 22 mEq/L (ref 19–32)
CO2: 20 mEq/L (ref 19–32)
CO2: 23 mEq/L (ref 19–32)
Calcium: 8.8 mg/dL (ref 8.4–10.5)
Calcium: 9.1 mg/dL (ref 8.4–10.5)
Calcium: 9.8 mg/dL (ref 8.4–10.5)
Chloride: 108 mEq/L (ref 96–112)
Creatinine, Ser: 1.37 mg/dL — ABNORMAL HIGH (ref 0.4–1.2)
GFR calc Af Amer: 54 mL/min — ABNORMAL LOW (ref >60)
GFR calc Af Amer: 57 mL/min — ABNORMAL LOW (ref >60)
GFR calc non Af Amer: 41 mL/min — ABNORMAL LOW (ref >60)
GFR calc non Af Amer: 45 mL/min — ABNORMAL LOW (ref >60)
Glucose, Bld: 278 mg/dL — ABNORMAL HIGH (ref 70–99)
Glucose, Bld: 262 mg/dL — ABNORMAL HIGH (ref 70–99)
Glucose, Bld: 273 mg/dL — ABNORMAL HIGH (ref 70–99)
Glucose, Bld: 355 mg/dL — ABNORMAL HIGH (ref 70–99)
Potassium: 3.9 mEq/L (ref 3.5–5.1)
Sodium: 137 mEq/L (ref 135–145)
Sodium: 138 mEq/L (ref 135–145)

## 2011-02-27 LAB — GLUCOSE, CAPILLARY
Glucose-Capillary: 291 mg/dL — ABNORMAL HIGH (ref 70–99)
Glucose-Capillary: 321 mg/dL — ABNORMAL HIGH (ref 70–99)
Glucose-Capillary: 223 mg/dL — ABNORMAL HIGH (ref 70–99)
Glucose-Capillary: 240 mg/dL — ABNORMAL HIGH (ref 70–99)
Glucose-Capillary: 323 mg/dL — ABNORMAL HIGH (ref 70–99)

## 2011-02-27 LAB — CBC
HCT: 28.9 % — ABNORMAL LOW (ref 36.0–46.0)
HCT: 32.7 % — ABNORMAL LOW (ref 36.0–46.0)
HCT: 35 % — ABNORMAL LOW (ref 36.0–46.0)
Hemoglobin: 9.9 g/dL — ABNORMAL LOW (ref 12.0–15.0)
Hemoglobin: 11.4 g/dL — ABNORMAL LOW (ref 12.0–15.0)
Hemoglobin: 12 g/dL (ref 12.0–15.0)
MCHC: 34.3 g/dL (ref 30.0–36.0)
MCHC: 33.7 g/dL (ref 30.0–36.0)
MCHC: 34.5 g/dL (ref 30.0–36.0)
MCHC: 34.8 g/dL (ref 30.0–36.0)
MCV: 86.8 fL (ref 78.0–100.0)
MCV: 86.1 fL (ref 78.0–100.0)
MCV: 86.5 fL (ref 78.0–100.0)
RBC: 4.05 MIL/uL (ref 3.87–5.11)
RDW: 15.8 % — ABNORMAL HIGH (ref 11.5–15.5)
RDW: 15.4 % (ref 11.5–15.5)
RDW: 15.6 % — ABNORMAL HIGH (ref 11.5–15.5)
RDW: 15.8 % — ABNORMAL HIGH (ref 11.5–15.5)
WBC: 8.2 10*3/uL (ref 4.0–10.5)

## 2011-02-27 LAB — URINALYSIS, MICROSCOPIC ONLY
Glucose, UA: 100 mg/dL — AB
Specific Gravity, Urine: 1.02 (ref 1.005–1.030)
pH: 5.5 (ref 5.0–8.0)

## 2011-02-27 LAB — CARDIAC PANEL(CRET KIN+CKTOT+MB+TROPI)
CK, MB: 105.6 ng/mL — ABNORMAL HIGH (ref 0.3–4.0)
CK, MB: 137.7 ng/mL — ABNORMAL HIGH (ref 0.3–4.0)
CK, MB: 144.2 ng/mL — ABNORMAL HIGH (ref 0.3–4.0)
Relative Index: 7.4 — ABNORMAL HIGH (ref 0.0–2.5)
Relative Index: 9.7 — ABNORMAL HIGH (ref 0.0–2.5)
Total CK: 1093 U/L — ABNORMAL HIGH (ref 7–177)
Total CK: 1957 U/L — ABNORMAL HIGH (ref 7–177)
Troponin I: 63.06 ng/mL (ref 0.00–0.06)

## 2011-02-27 LAB — POCT I-STAT, CHEM 8
BUN: 15 mg/dL (ref 6–23)
Calcium, Ion: 1.28 mmol/L (ref 1.12–1.32)
Chloride: 110 mEq/L (ref 96–112)
Creatinine, Ser: 1.2 mg/dL (ref 0.4–1.2)
Glucose, Bld: 338 mg/dL — ABNORMAL HIGH (ref 70–99)
HCT: 40 % (ref 36.0–46.0)
Potassium: 3.7 mEq/L (ref 3.5–5.1)

## 2011-02-27 LAB — BRAIN NATRIURETIC PEPTIDE
Pro B Natriuretic peptide (BNP): 78 pg/mL (ref 0.0–100.0)
Pro B Natriuretic peptide (BNP): 71 pg/mL (ref 0.0–100.0)

## 2011-02-27 LAB — LIPID PANEL
Cholesterol: 198 mg/dL (ref 0–200)
HDL: 31 mg/dL — ABNORMAL LOW (ref >39)
Total CHOL/HDL Ratio: 6.4 RATIO

## 2011-02-27 LAB — DIFFERENTIAL
Basophils Absolute: 0 10*3/uL (ref 0.0–0.1)
Eosinophils Absolute: 0.4 10*3/uL (ref 0.0–0.7)
Lymphocytes Relative: 59 % — ABNORMAL HIGH (ref 12–46)
Monocytes Absolute: 0.9 10*3/uL (ref 0.1–1.0)
Neutro Abs: 3.8 10*3/uL (ref 1.7–7.7)
Neutrophils Relative %: 31 % — ABNORMAL LOW (ref 43–77)

## 2011-02-27 LAB — POCT CARDIAC MARKERS
CKMB, poc: 3 ng/mL (ref 1.0–8.0)
Troponin i, poc: <0.05 ng/mL (ref 0.00–0.09)

## 2011-02-27 LAB — HEMOGLOBIN A1C: Mean Plasma Glucose: 269 mg/dL

## 2011-02-27 LAB — PROTIME-INR: Prothrombin Time: 12.8 seconds (ref 11.6–15.2)

## 2011-02-27 LAB — URINE CULTURE

## 2011-04-03 NOTE — Cardiovascular Report (Signed)
[name], [name]               ACCOUNT NO.:  [id number]   MEDICAL RECORD NO.:  [id number]          PATIENT TYPE:  INP   LOCATION:  1844                         FACILITY:  [facility]   PHYSICIAN:  Ulice Dash R. Einar Gip, MD       DATE OF BIRTH:  [birth year]   DATE OF PROCEDURE:  03/31/2009  DATE OF DISCHARGE:                            CARDIAC CATHETERIZATION   PROCEDURES PERFORMED:  1. Left ventriculography.  2. Selective right and left coronary arteriography.  3. Ascending aortogram.   INDICATIONS:  [name] is a 60 year old diabetic, hypertensive,  hyperlipidemic female who had crushing chest pain about 20-30 minutes  prior to the arrival to the emergency room.  She has been having  recurrent chest pain over the last 2 days.  This chest pain was severe  this afternoon leading to diaphoresis and nausea.  She was admitted via  EMS as an acute inferior wall and post wall myocardial infarction.  She  was emergently brought to the cardiac catheterization lab to reevaluate  her coronary anatomy  Because of back pain to exclude aortic dissection,  ascending aortogram was performed.   HEMODYNAMIC DATA:  The left ventricular pressure was 110/13 with an end-  diastolic pressure of 20 mmHg.  Aortic pressure was 119/48 with a mean  of 78 mmHg.  There was no pressure gradient across the aortic valve.   ANGIOGRAPHIC DATA:  Left ventricle:  Left ventricular systolic function  was preserved with an ejection fraction of 55%.  There was no  significant mitral regurgitation.  There was inferior and inferoseptal  akinesis.   Right coronary artery:  Right coronary artery is a large caliber vessel,  dominant vessel.  It gives origin to large PDA.  It is occluded in the  proximal segment and it is filled with thrombus.   Left main coronary artery:  Left main coronary artery is a large-caliber  vessel.  Smooth and normal.   Circumflex:  Circumflex artery is a large-caliber vessel.  It gives  origin  to large obtuse marginal 1 and 2 and continues as a small PDA  branch.  It is codominant with right coronary artery.  It is smooth and  normal.   left anterior descending artery.  The LAD is a large-caliber vessel  giving origin to several small diagonals.  It is smooth and normal.   Ascending aortogram.  Ascending aortogram revealed presence of 2 aortic  valve cusps.  This is a bicuspid aortic valve.  There was no evidence of  recent aortic dissection or aneurysm or aortic regurgitation.   INTERVENTION DATA.:  Successful infusion of intracoronary ReoPro using  ClearWay infusion catheter.  TIMI 3 flow was established with infusion  of ReoPro directly into the thrombotic occlusion.  This was followed by  successful PTCA and direct stenting with 3.5- x 24-mm driver stent,  which was deployed at 12 atmospheric pressure and postdilated with the  same balloon filling it back gently invert into the ostium of right  coronary artery at 14 atmospheric pressure for 30 seconds.  The stenosis  was overall reduced from 100%  to 0% with brisk TIMI 0 to improved TIMI 3  flow at the end of the procedure.  No evidence of dissection or thrombus  at the end of the procedure.  Excellent blush was noted.   RECOMMENDATIONS:  The patient will be admitted to the intensive care  unit and followed through.  Risk modification is indicated.  She will be  continued on FEN at least for a period of a year.   Materials used was a 7-French FR-4 guide with side hole, ASAHI Prowater  guidewire, ReoPro, ClearWay catheter followed by a intracoronary 120 mcg  of adenosine and intracoronary ReoPro followed by stenting with 3.4- x  24-mm driver stent, nondrug eluting.   TECHNIQUE OF PROCEDURE:  Under usual sterile precautions using a 6-  French right femoral arterial access, 6-French multipurpose B2 catheter  was advanced into the ascending aorta and then to the left ventricle.  Left ventriculography was performed both in  LAO and RAO projection.  Right coronary artery was selectively engaged and angiography was  performed and then left main coronary artery was selectively engaged and  angiography was performed and then catheter pulled out of the body.   Using heparin for anticoagulation, maintaining ACT greater than 200, a 7-  [place] FR-4 guide with side hole was utilized to engage the right  coronary artery and using ASAHI Prowater, I was able to easily advanced  through the right coronary artery.  A ClearWay 2.0 x 10-mm ClearWay  infusion balloon was advanced into the right coronary artery and ReoPro  was administered directly in the intracoronary vessel.  TIMI 3 flow was  established.  This was followed by stenting in the usual fashion.  Having performed this after the infusion, after TIMI 3 flow was  established, a total of 120 mcg of intracoronary adenosine was also  administered.   Prior to start of the procedure because of complete heart block, a  transvenous pacemaker was introduced into the right ventricle and  excellent capture was obtained at 5 milliamps.   At the end of the procedure, the pacemaker was withdrawn, guidewire was  withdrawn, angiography repeated, guide catheter disengaged and pulled  out of the body.  The patient tolerated the procedure.  No immediate  complication were noted.      Eden Lathe. Einar Gip, MD  Electronically Signed     JRG/MEDQ  D:  03/31/2009  T:  04/01/2009  Job:  [id number]   cc:   Dr. Trilby Drummer

## 2011-06-01 ENCOUNTER — Other Ambulatory Visit: Payer: Self-pay | Admitting: Internal Medicine

## 2011-06-01 DIAGNOSIS — Z1231 Encounter for screening mammogram for malignant neoplasm of breast: Secondary | ICD-10-CM

## 2011-06-07 ENCOUNTER — Ambulatory Visit: Payer: Medicare Other

## 2011-06-20 ENCOUNTER — Ambulatory Visit: Payer: Medicare Other

## 2011-07-25 ENCOUNTER — Ambulatory Visit: Payer: Medicare Other

## 2011-09-28 ENCOUNTER — Ambulatory Visit: Payer: Medicare Other | Attending: Gynecology | Admitting: Gynecology

## 2011-10-02 ENCOUNTER — Telehealth: Payer: Self-pay | Admitting: *Deleted

## 2011-10-02 NOTE — Telephone Encounter (Signed)
Pt was called to rescheduled missed appointment on November 7,2012, pt appointment has been rescheduled for December 5,2012 with Dr. Terrilee Files at [phone]

## 2011-10-24 ENCOUNTER — Ambulatory Visit: Payer: Medicare Other | Attending: Gynecologic Oncology | Admitting: Gynecologic Oncology

## 2011-10-24 ENCOUNTER — Encounter: Payer: Self-pay | Admitting: Gynecologic Oncology

## 2011-10-24 VITALS — BP 162/76 | HR 80 | Temp 98.3°F | Resp 16 | Ht 62.4 in | Wt 243.9 lb

## 2011-10-24 DIAGNOSIS — R0602 Shortness of breath: Secondary | ICD-10-CM | POA: Insufficient documentation

## 2011-10-24 DIAGNOSIS — Z90710 Acquired absence of both cervix and uterus: Secondary | ICD-10-CM | POA: Insufficient documentation

## 2011-10-24 DIAGNOSIS — C541 Malignant neoplasm of endometrium: Secondary | ICD-10-CM

## 2011-10-24 DIAGNOSIS — Z9079 Acquired absence of other genital organ(s): Secondary | ICD-10-CM | POA: Insufficient documentation

## 2011-10-24 DIAGNOSIS — E785 Hyperlipidemia, unspecified: Secondary | ICD-10-CM | POA: Insufficient documentation

## 2011-10-24 DIAGNOSIS — I10 Essential (primary) hypertension: Secondary | ICD-10-CM | POA: Insufficient documentation

## 2011-10-24 DIAGNOSIS — J449 Chronic obstructive pulmonary disease, unspecified: Secondary | ICD-10-CM | POA: Insufficient documentation

## 2011-10-24 DIAGNOSIS — J4489 Other specified chronic obstructive pulmonary disease: Secondary | ICD-10-CM | POA: Insufficient documentation

## 2011-10-24 DIAGNOSIS — E119 Type 2 diabetes mellitus without complications: Secondary | ICD-10-CM | POA: Insufficient documentation

## 2011-10-24 DIAGNOSIS — C549 Malignant neoplasm of corpus uteri, unspecified: Secondary | ICD-10-CM | POA: Insufficient documentation

## 2011-10-24 DIAGNOSIS — Z01419 Encounter for gynecological examination (general) (routine) without abnormal findings: Secondary | ICD-10-CM | POA: Insufficient documentation

## 2011-10-24 NOTE — Progress Notes (Signed)
Consult Note: Gyn-Onc  [name] 60 y.o. female  CC:  Chief Complaint  Patient presents with  . Follow-up    Endo ca    HPI: Patient is a year-old with multiple medical problems including COPD, history of myocardial infarction, and diabetes who was diagnosed with grade 1 endometrial carcinoma. January of 2004 showing robotic hysterectomy bilateral salpingo-oophorectomy left pelvic lymph node dissection. Operative findings included the omentum that was adherent to the abdominal wall. 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 The cervix was negative. She had fibroids benign ovaries and 0/7 lymph nodes were involved. She was last seen for a postoperative check February 8 of the year 2012. She was asked to see [place] in 6 months and comes in today for her first visit.  Interval History: She has gained 30 pounds since we last saw her she felt that it was due to the vitamin D supplementation she was given. Ultimately she admits to eating too much and getting no exercise. She states that she is going to start going to the gym every day.  Review of Systems: No chest pain. She says that since her weight has gone up she's beginning to experience some shortness of breath walking up stairs. She denies any nausea, vomiting, fevers, chills, change in bowel or bladder habits. She denies any abdominal or pelvic pain. She denies any headaches or visual changes. 10 point review of systems is otherwise negative.  Current Meds:  Outpatient Encounter Prescriptions as of 10/24/2011  Medication Sig Dispense Refill  . benazepril (LOTENSIN) 10 MG tablet Take 10 mg by mouth daily.        Marland Kitchen glipiZIDE (GLUCOTROL) 10 MG tablet Take 10 mg by mouth daily.        . insulin glargine (LANTUS) 100 UNIT/ML injection Inject 30 Units into the skin at bedtime.        . metFORMIN (GLUCOPHAGE) 1000 MG tablet Take 1,000 mg by mouth 2 (two) times daily.        . simvastatin  (ZOCOR) 10 MG tablet Take 10 mg by mouth daily. Patient is uncertain of this dosage         Allergy: No Known Allergies  Social Hx:   History   Social History  . Marital Status: Widowed    Spouse Name: N/A    Number of Children: N/A  . Years of Education: N/A   Occupational History  . Not on file.   Social History Main Topics  . Smoking status: Never Smoker   . Smokeless tobacco: Not on file  . Alcohol Use: No  . Drug Use: No  . Sexually Active: No   Other Topics Concern  . Not on file   Social History Narrative  . No narrative on file    Past Surgical Hx:  Past Surgical History  Procedure Date  . Abdominal hysterectomy   . Leg tendon surgery     patient fell in a store right leg  . Leg surgery     hole in bone( during Childhood)    Past Medical Hx:  Past Medical History  Diagnosis Date  . Hypertension   . Diabetes mellitus   . Hyperlipemia   . Cancer     endo ca    Family Hx: No family history on file.  Vitals:  Blood pressure 162/76, pulse 80, temperature 98.3 F (36.8 C), temperature source Oral, resp. rate 16, height 5' 2.4" (1.585 m), weight 243  lb 14.4 oz (110.632 kg).  Physical Exam: Well-nourished well-developed female in no acute distress.  Neck: Nontender thyromegaly.  Lungs: Clear to auscultation bilaterally.  Cardiovascular: Regular rate and rhythm with a 2/6 systolic ejection murmur.  Abdomen: Mildly obese soft nontender nondistended there are no palpable masses or hepatosplenomegaly. Exam is limited by habitus. She has well-healed surgical incisions.  Extremities: 1-2+ nonpitting edema.  Pelvic: External genitalia is within normal limits. The vagina is somewhat atrophic. The vaginal cuff is visualized. There are no visible lesions. ThinPrep Pap smear without difficulty. Bimanual examination reveals no masses or nodularity. Exam is limited by habitus.  Rectal: No masses or nodularity.   Assessment/Plan: 60 year old with multiple  medical problems who has a history of a stage IA grade 1 endometrial carcinoma and clinically has no evidence of recurrent disease almost one year after the time of diagnosis. Plan: Will followup on the results of your Pap smear from today. She will return to see Dr. Marvel Plan in 6 months and return to see [place] in 1 year. She states that she's never had a mammogram performed. She will take the responsibility of calling in scheduling a mammogram.  Sharunda Salmon A., MD 10/24/2011, 4:25 PM

## 2011-10-24 NOTE — Patient Instructions (Signed)
Return to see Dr. Marvel Plan in 6 months. You'll need to see [place] in 1 year. Please remember to schedule your mammogram.

## 2011-10-31 ENCOUNTER — Other Ambulatory Visit (HOSPITAL_COMMUNITY)
Admission: RE | Admit: 2011-10-31 | Discharge: 2011-10-31 | Disposition: A | Discharge status: Home / Self Care | Payer: Medicare Other | Source: Ambulatory Visit | Attending: Gynecologic Oncology | Admitting: Gynecologic Oncology

## 2011-11-05 ENCOUNTER — Telehealth: Payer: Self-pay | Admitting: *Deleted

## 2011-11-05 NOTE — Telephone Encounter (Signed)
Notified patient that the pap smear that was taken on 10/24/2011 by Dr, Alycia Rossetti was negative

## 2011-12-03 ENCOUNTER — Other Ambulatory Visit: Payer: Self-pay | Admitting: Cardiovascular Disease

## 2011-12-03 NOTE — Telephone Encounter (Signed)
..   Requested Prescriptions   Pending Prescriptions Disp Refills  . carvedilol (COREG) 6.25 MG tablet [Pharmacy Med Name: CARVEDILOL 6.25 MG TABLET] 60 tablet 7    Sig: take 1 tablet by mouth twice a day  . losartan (COZAAR) 25 MG tablet [Pharmacy Med Name: LOSARTAN POTASSIUM 25 MG TAB] 30 tablet 6    Sig: take 1 tablet by mouth once daily

## 2011-12-25 ENCOUNTER — Encounter (HOSPITAL_COMMUNITY): Payer: Self-pay | Admitting: Emergency Medicine

## 2011-12-25 ENCOUNTER — Emergency Department (INDEPENDENT_AMBULATORY_CARE_PROVIDER_SITE_OTHER)
Admission: EM | Admit: 2011-12-25 | Discharge: 2011-12-25 | Disposition: A | Discharge status: Home / Self Care | Payer: Medicare Other | Source: Home / Self Care | Attending: Family Medicine | Admitting: Family Medicine

## 2011-12-25 DIAGNOSIS — L299 Pruritus, unspecified: Secondary | ICD-10-CM

## 2011-12-25 MED ORDER — PERMETHRIN 5 % EX CREA: TOPICAL_CREAM | CUTANEOUS | Status: AC

## 2011-12-25 NOTE — ED Notes (Signed)
Pt. Stated, I've had a rash for 2 days to a week. I've been exposed to scabies

## 2011-12-25 NOTE — ED Provider Notes (Signed)
History     CSN: [id number]  Arrival date & time 12/25/11  1950   First MD Initiated Contact with Patient 12/25/11 2116      Chief Complaint  Patient presents with  . Rash    (Consider location/radiation/quality/duration/timing/severity/associated sxs/prior treatment) HPI Comments: [name] presents for evaluation of itchy skin. She reports that recently a niece had stayed in their house, who was diagnosed with scabies. She reports itching over her abdomen and lower back. There are no visible lesions, lumps, or rash.  Patient is a 61 y.o. female presenting with rash. The history is provided by the patient.  Rash  This is a new problem. The current episode started more than 2 days ago. The problem has not changed since onset.The problem is associated with an unknown factor. There has been no fever. The rash is present on the trunk, abdomen and back. The patient is experiencing no pain. The pain has been constant since onset. Associated symptoms include itching.    Past Medical History  Diagnosis Date  . Hypertension   . Diabetes mellitus   . Hyperlipemia   . Cancer     endo ca    Past Surgical History  Procedure Date  . Abdominal hysterectomy   . Leg tendon surgery     patient fell in a store right leg  . Leg surgery     hole in bone( during Childhood)    No family history on file.  History  Substance Use Topics  . Smoking status: Never Smoker   . Smokeless tobacco: Never Used  . Alcohol Use: No    OB History    Grav Para Term Preterm Abortions TAB SAB Ect Mult Living                  Review of Systems  Constitutional: Negative.   HENT: Negative.   Eyes: Negative.   Respiratory: Negative.   Cardiovascular: Negative.   Gastrointestinal: Negative.   Genitourinary: Negative.   Musculoskeletal: Negative.   Skin: Positive for itching and rash.  Neurological: Negative.     Allergies  Review of patient's allergies indicates no known allergies.  Home  Medications   Current Outpatient Rx  Name Route Sig Dispense Refill  . BENAZEPRIL HCL 10 MG PO TABS Oral Take 10 mg by mouth daily.      Marland Kitchen CARVEDILOL 6.25 MG PO TABS  take 1 tablet by mouth twice a day 60 tablet 7  . GLIPIZIDE 10 MG PO TABS Oral Take 10 mg by mouth daily.      . INSULIN GLARGINE 100 UNIT/ML [place] SOLN Subcutaneous Inject 30 Units into the skin at bedtime.      Marland Kitchen LOSARTAN POTASSIUM 25 MG PO TABS  take 1 tablet by mouth once daily 30 tablet 6  . METFORMIN HCL 1000 MG PO TABS Oral Take 1,000 mg by mouth 2 (two) times daily.      Marland Kitchen PERMETHRIN 5 % EX CREA  Apply to affected area once; leave on for at least 8 - 14 hours before washing off; may repeat after 1 week 60 g 1  . SIMVASTATIN 10 MG PO TABS Oral Take 10 mg by mouth daily. Patient is uncertain of this dosage       BP 173/72  Pulse 84  Temp(Src) 98.2 F (36.8 C) (Oral)  Resp 20  SpO2 100%  Physical Exam  Nursing note and vitals reviewed. Constitutional: She is oriented to person, place, and time. She appears well-developed and  well-nourished.  HENT:  Head: Normocephalic and atraumatic.  Eyes: EOM are normal.  Neck: Normal range of motion.  Pulmonary/Chest: Effort normal.  Musculoskeletal: Normal range of motion.  Neurological: She is alert and oriented to person, place, and time.  Skin: Skin is warm and dry.       No visible lesions consistent with bug bites or scabies or any visible rash  Psychiatric: Her behavior is normal.    ED Course  Procedures (including critical care time)  Labs Reviewed - No data to display No results found.   1. Pruritus       MDM  Low suspicion for scabies based on exam, but given permethrin with known exposure        Marcell Anger, MD 12/25/11 2215

## 2012-07-20 ENCOUNTER — Encounter (HOSPITAL_COMMUNITY): Payer: Self-pay | Admitting: *Deleted

## 2012-07-20 ENCOUNTER — Emergency Department (HOSPITAL_COMMUNITY): Payer: Medicare Other

## 2012-07-20 DIAGNOSIS — J189 Pneumonia, unspecified organism: Secondary | ICD-10-CM | POA: Insufficient documentation

## 2012-07-20 DIAGNOSIS — E119 Type 2 diabetes mellitus without complications: Secondary | ICD-10-CM | POA: Insufficient documentation

## 2012-07-20 DIAGNOSIS — Z79899 Other long term (current) drug therapy: Secondary | ICD-10-CM | POA: Insufficient documentation

## 2012-07-20 DIAGNOSIS — D649 Anemia, unspecified: Secondary | ICD-10-CM | POA: Insufficient documentation

## 2012-07-20 DIAGNOSIS — I10 Essential (primary) hypertension: Secondary | ICD-10-CM | POA: Insufficient documentation

## 2012-07-20 DIAGNOSIS — E785 Hyperlipidemia, unspecified: Secondary | ICD-10-CM | POA: Insufficient documentation

## 2012-07-20 LAB — CBC WITH DIFFERENTIAL/PLATELET
Basophils Absolute: 0 10*3/uL (ref 0.0–0.1)
Basophils Relative: 0 % (ref 0–1)
Eosinophils Relative: 2 % (ref 0–5)
HCT: 26.9 % — ABNORMAL LOW (ref 36.0–46.0)
MCHC: 33.1 g/dL (ref 30.0–36.0)
MCV: 85.9 fL (ref 78.0–100.0)
Monocytes Absolute: 0.7 10*3/uL (ref 0.1–1.0)
Monocytes Relative: 9 % (ref 3–12)
RDW: 14.9 % (ref 11.5–15.5)

## 2012-07-20 LAB — COMPREHENSIVE METABOLIC PANEL
AST: 16 U/L (ref 0–37)
Albumin: 2.9 g/dL — ABNORMAL LOW (ref 3.5–5.2)
BUN: 23 mg/dL (ref 6–23)
CO2: 25 mEq/L (ref 19–32)
Calcium: 9.8 mg/dL (ref 8.4–10.5)
Creatinine, Ser: 1.34 mg/dL — ABNORMAL HIGH (ref 0.50–1.10)
GFR calc non Af Amer: 42 mL/min — ABNORMAL LOW (ref >90)

## 2012-07-20 NOTE — ED Notes (Signed)
Pt states SOB, wheezing for approxiamtely a week. Pt states she doesn't have medications for SOB or wheezing, and this hasn't happened before. Pt denies pain. Pt in no respiratory distress, pt speaking in full sentences.

## 2012-07-21 ENCOUNTER — Emergency Department (HOSPITAL_COMMUNITY)
Admission: EM | Admit: 2012-07-21 | Discharge: 2012-07-21 | Disposition: A | Discharge status: Home / Self Care | Payer: Medicare Other | Attending: Emergency Medicine | Admitting: Emergency Medicine

## 2012-07-21 ENCOUNTER — Encounter (HOSPITAL_COMMUNITY): Payer: Self-pay | Admitting: Emergency Medicine

## 2012-07-21 DIAGNOSIS — J189 Pneumonia, unspecified organism: Secondary | ICD-10-CM

## 2012-07-21 DIAGNOSIS — D649 Anemia, unspecified: Secondary | ICD-10-CM

## 2012-07-21 MED ORDER — ALBUTEROL SULFATE HFA 108 (90 BASE) MCG/ACT IN AERS
2.0000 | INHALATION_SPRAY | RESPIRATORY_TRACT | Status: DC | PRN
  Administered 2012-07-21: 2 via RESPIRATORY_TRACT
  Filled 2012-07-21: qty 6.7

## 2012-07-21 MED ORDER — AZITHROMYCIN 250 MG PO TABS: 250.0000 mg | ORAL_TABLET | Freq: Every day | ORAL | Status: AC

## 2012-07-21 MED ORDER — FERROUS SULFATE 325 (65 FE) MG PO TABS: 325.0000 mg | ORAL_TABLET | Freq: Every day | ORAL | Status: DC

## 2012-07-21 MED ORDER — METFORMIN HCL 1000 MG PO TABS: 1000.0000 mg | ORAL_TABLET | Freq: Two times a day (BID) | ORAL | Status: DC

## 2012-07-21 MED ORDER — AZITHROMYCIN 250 MG PO TABS
500.0000 mg | ORAL_TABLET | Freq: Once | ORAL | Status: AC
  Administered 2012-07-21: 500 mg via ORAL
  Filled 2012-07-21: qty 2

## 2012-07-21 NOTE — ED Notes (Signed)
Patient is alert and oriented x4.  Patient was explained discharge instructions and had no questions.  Patient is pain free.

## 2012-07-21 NOTE — ED Provider Notes (Signed)
History     CSN: [id number]  Arrival date & time 07/20/12  2033   First MD Initiated Contact with Patient 07/21/12 0043      Chief Complaint  Patient presents with  . Shortness of Breath    (Consider location/radiation/quality/duration/timing/severity/associated sxs/prior treatment) HPI Pt reports about a week of cough, wheezing and occasional SOB. Denies any CP, fever. She has had similar symptoms with bronchitis in the past. No particular provoking or relieving factors. States she has had some puffiness in her feet, but no significant asymmetric LE edema. No recent travel. She reports prior history of anemia. Reports she has been out of her metformin for a few weeks, unable to get a refill due to bill owed at Dr. Tristan Schroeder office.   Past Medical History  Diagnosis Date  . Hypertension   . Diabetes mellitus   . Hyperlipemia   . Cancer     endo ca    Past Surgical History  Procedure Date  . Abdominal hysterectomy   . Leg tendon surgery     patient fell in a store right leg  . Leg surgery     hole in bone( during Childhood)    History reviewed. No pertinent family history.  History  Substance Use Topics  . Smoking status: Never Smoker   . Smokeless tobacco: Never Used  . Alcohol Use: No    OB History    Grav Para Term Preterm Abortions TAB SAB Ect Mult Living                  Review of Systems All other systems reviewed and are negative except as noted in HPI.   Allergies  Review of patient's allergies indicates no known allergies.  Home Medications   Current Outpatient Rx  Name Route Sig Dispense Refill  . CARVEDILOL 6.25 MG PO TABS  take 1 tablet by mouth twice a day 60 tablet 7  . VITAMIN D3 PO Oral Take 1 tablet by mouth once a week. Takes on Thursday    . DICLOFENAC-MISOPROSTOL 75-0.2 MG PO TBEC Oral Take 1 tablet by mouth daily.    Marland Kitchen GABAPENTIN 100 MG PO CAPS Oral Take 100 mg by mouth 3 (three) times daily.    Marland Kitchen GLIPIZIDE ER 10 MG PO TB24 Oral Take  10 mg by mouth daily.    . INSULIN GLARGINE 100 UNIT/ML [place] SOLN Subcutaneous Inject 30 Units into the skin at bedtime.      Marland Kitchen LOSARTAN POTASSIUM 25 MG PO TABS  take 1 tablet by mouth once daily 30 tablet 6  . METFORMIN HCL 1000 MG PO TABS Oral Take 1,000 mg by mouth 2 (two) times daily.      Marland Kitchen NITROGLYCERIN 0.4 MG SL SUBL Sublingual Place 0.4 mg under the tongue every 5 (five) minutes as needed. For chest pain    . SIMVASTATIN 80 MG PO TABS Oral Take 80 mg by mouth daily.      BP 163/75  Pulse 76  Temp 98.6 F (37 C) (Oral)  Resp 16  SpO2 97%  Physical Exam  Nursing note and vitals reviewed. Constitutional: She is oriented to person, place, and time. She appears well-developed and well-nourished.  HENT:  Head: Normocephalic and atraumatic.  Eyes: EOM are normal. Pupils are equal, round, and reactive to light.  Neck: Normal range of motion. Neck supple.  Cardiovascular: Normal rate, normal heart sounds and intact distal pulses.   Pulmonary/Chest: Effort normal and breath sounds normal.  Abdominal: Bowel  sounds are normal. She exhibits no distension. There is no tenderness.  Musculoskeletal: Normal range of motion. She exhibits no edema and no tenderness.  Neurological: She is alert and oriented to person, place, and time. She has normal strength. No cranial nerve deficit or sensory deficit.  Skin: Skin is warm and dry. No rash noted.  Psychiatric: She has a normal mood and affect.    ED Course  Procedures (including critical care time)  Labs Reviewed  CBC WITH DIFFERENTIAL - Abnormal; Notable for the following:    RBC 3.13 (*)     Hemoglobin 8.9 (*)     HCT 26.9 (*)     All other components within normal limits  COMPREHENSIVE METABOLIC PANEL - Abnormal; Notable for the following:    Glucose, Bld 189 (*)     Creatinine, Ser 1.34 (*)     Albumin 2.9 (*)     Alkaline Phosphatase 145 (*)     GFR calc non Af Amer 42 (*)     GFR calc Af Amer 48 (*)     All other components  within normal limits  POCT I-STAT TROPONIN I   Dg Chest 2 View  07/20/2012  *RADIOLOGY REPORT*  Clinical Data: Cough, shortness of breath, hypertension, diabetes  CHEST - 2 VIEW  Comparison: 11/15/2010  Findings: Enlargement of cardiac silhouette. Pulmonary vascular congestion. Mediastinal contours normal. Peribronchial thickening with atelectasis versus infiltrate or scarring right base. Minimal atelectasis or scarring and left mid lung. Small right pleural effusion. Hazy density in the right upper lobe question subtle infiltrate. Multilevel endplate spur formation thoracic spine. Minimally prominent right hilum.  IMPRESSION: Chronic bibasilar changes with question small right upper lobe infiltrate. Enlargement of cardiac silhouette with slight pulmonary vascular congestion. Minimally prominent right hilum, potentially related to reactive hilar lymph nodes in the setting of potential acute right upper lobe infiltrate. Follow-up radiographs recommended in 4-6 weeks to reassess.   Original Report Authenticated By: Burnetta Sabin, M.D.      No diagnosis found.    MDM   Date: 07/21/2012  Rate: 77  Rhythm: normal sinus rhythm  QRS Axis: normal  Intervals: normal  ST/T Wave abnormalities: nonspecific T wave changes  Conduction Disutrbances:none  Narrative Interpretation:   Old EKG Reviewed: unchanged  Pt has anemia, but improved from prior. She likely has a bronchitis but CXR also concerning for early infiltrate. Will begin Zithromax. Albuterol as needed. Refill for Metformin, iron supplementation. No concern for ACS or PE.         Charles B. Karle Starch, MD 07/21/12 0110

## 2012-07-21 NOTE — Discharge Instructions (Signed)
Pneumonia, Adult Pneumonia is an infection of the lungs.  CAUSES Pneumonia may be caused by bacteria or a virus. Usually, these infections are caused by breathing infectious particles into the lungs (respiratory tract). SYMPTOMS   Cough.   Fever.   Chest pain.   Increased rate of breathing.   Wheezing.   Mucus production.  DIAGNOSIS  If you have the common symptoms of pneumonia, your caregiver will typically confirm the diagnosis with a chest X-ray. The X-ray will show an abnormality in the lung (pulmonary infiltrate) if you have pneumonia. Other tests of your blood, urine, or sputum may be done to find the specific cause of your pneumonia. Your caregiver may also do tests (blood gases or pulse oximetry) to see how well your lungs are working. TREATMENT  Some forms of pneumonia may be spread to other people when you cough or sneeze. You may be asked to wear a mask before and during your exam. Pneumonia that is caused by bacteria is treated with antibiotic medicine. Pneumonia that is caused by the influenza virus may be treated with an antiviral medicine. Most other viral infections must run their course. These infections will not respond to antibiotics.  PREVENTION A pneumococcal shot (vaccine) is available to prevent a common bacterial cause of pneumonia. This is usually suggested for:  People over 22 years old.   Patients on chemotherapy.   People with chronic lung problems, such as bronchitis or emphysema.   People with immune system problems.  If you are over 65 or have a high risk condition, you may receive the pneumococcal vaccine if you have not received it before. In some countries, a routine influenza vaccine is also recommended. This vaccine can help prevent some cases of pneumonia.You may be offered the influenza vaccine as part of your care. If you smoke, it is time to quit. You may receive instructions on how to stop smoking. Your caregiver can provide medicines and  counseling to help you quit. HOME CARE INSTRUCTIONS   Cough suppressants may be used if you are losing too much rest. However, coughing protects you by clearing your lungs. You should avoid using cough suppressants if you can.   Your caregiver may have prescribed medicine if he or she thinks your pneumonia is caused by a bacteria or influenza. Finish your medicine even if you start to feel better.   Your caregiver may also prescribe an expectorant. This loosens the mucus to be coughed up.   Only take over-the-counter or prescription medicines for pain, discomfort, or fever as directed by your caregiver.   Do not smoke. Smoking is a common cause of bronchitis and can contribute to pneumonia. If you are a smoker and continue to smoke, your cough may last several weeks after your pneumonia has cleared.   A cold steam vaporizer or humidifier in your room or home may help loosen mucus.   Coughing is often worse at night. Sleeping in a semi-upright position in a recliner or using a couple pillows under your head will help with this.   Get rest as you feel it is needed. Your body will usually let you know when you need to rest.  SEEK IMMEDIATE MEDICAL CARE IF:   Your illness becomes worse. This is especially true if you are elderly or weakened from any other disease.   You cannot control your cough with suppressants and are losing sleep.   You begin coughing up blood.   You develop pain which is getting worse or  is uncontrolled with medicines.   You have a fever.   Any of the symptoms which initially brought you in for treatment are getting worse rather than better.   You develop shortness of breath or chest pain.  MAKE SURE YOU:   Understand these instructions.   Will watch your condition.   Will get help right away if you are not doing well or get worse.  Document Released: 11/05/2005 Document Revised: 10/25/2011 Document Reviewed: 01/25/2011 [facility] Patient Information 2012  [place].  Anemia, Nonspecific Your exam and blood tests show you are anemic. This means your blood (hemoglobin) level is low. Normal hemoglobin values are 12 to 15 g/dL for females and 14 to 17 g/dL for males. Make a note of your hemoglobin level today. The hematocrit percent is also used to measure anemia. A normal hematocrit is 38% to 46% in females and 42% to 49% in males. Make a note of your hematocrit level today. CAUSES  Anemia can be due to many different causes.  Excessive bleeding from periods (in women).   Intestinal bleeding.   Poor nutrition.   Kidney, thyroid, liver, and bone marrow diseases.  SYMPTOMS  Anemia can come on suddenly (acute). It can also come on slowly. Symptoms can include:  Minor weakness.   Dizziness.   Palpitations.   Shortness of breath.  Symptoms may be absent until half your hemoglobin is missing if it comes on slowly. Anemia due to acute blood loss from an injury or internal bleeding may require blood transfusion if the loss is severe. Hospital care is needed if you are anemic and there is significant continual blood loss. TREATMENT   Stool tests for blood (Hemoccult) and additional lab tests are often needed. This determines the best treatment.   Further checking on your condition and your response to treatment is very important. It often takes many weeks to correct anemia.  Depending on the cause, treatment can include:  Supplements of iron.   Vitamins 123456 and folic acid.   Hormone medicines.If your anemia is due to bleeding, finding the cause of the blood loss is very important. This will help avoid further problems.  SEEK IMMEDIATE MEDICAL CARE IF:   You develop fainting, extreme weakness, shortness of breath, or chest pain.   You develop heavy vaginal bleeding.   You develop bloody or black, tarry stools or vomit up blood.   You develop a high fever, rash, repeated vomiting, or dehydration.  Document Released: 12/13/2004  Document Revised: 10/25/2011 Document Reviewed: 09/20/2009 [facility] Patient Information 2012 [place].

## 2012-09-20 ENCOUNTER — Other Ambulatory Visit: Payer: Self-pay | Admitting: Cardiovascular Disease

## 2012-09-25 ENCOUNTER — Emergency Department (HOSPITAL_COMMUNITY): Payer: Medicare Other

## 2012-09-25 ENCOUNTER — Emergency Department (HOSPITAL_COMMUNITY)
Admission: EM | Admit: 2012-09-25 | Discharge: 2012-09-25 | Disposition: A | Discharge status: Home / Self Care | Payer: Medicare Other | Attending: Emergency Medicine | Admitting: Emergency Medicine

## 2012-09-25 DIAGNOSIS — Z79899 Other long term (current) drug therapy: Secondary | ICD-10-CM | POA: Insufficient documentation

## 2012-09-25 DIAGNOSIS — E119 Type 2 diabetes mellitus without complications: Secondary | ICD-10-CM | POA: Insufficient documentation

## 2012-09-25 DIAGNOSIS — J189 Pneumonia, unspecified organism: Secondary | ICD-10-CM | POA: Insufficient documentation

## 2012-09-25 DIAGNOSIS — Z794 Long term (current) use of insulin: Secondary | ICD-10-CM | POA: Insufficient documentation

## 2012-09-25 DIAGNOSIS — Z8589 Personal history of malignant neoplasm of other organs and systems: Secondary | ICD-10-CM | POA: Insufficient documentation

## 2012-09-25 DIAGNOSIS — I10 Essential (primary) hypertension: Secondary | ICD-10-CM | POA: Insufficient documentation

## 2012-09-25 DIAGNOSIS — E785 Hyperlipidemia, unspecified: Secondary | ICD-10-CM | POA: Insufficient documentation

## 2012-09-25 DIAGNOSIS — R0602 Shortness of breath: Secondary | ICD-10-CM | POA: Insufficient documentation

## 2012-09-25 LAB — BASIC METABOLIC PANEL
BUN: 31 mg/dL — ABNORMAL HIGH (ref 6–23)
Calcium: 9.7 mg/dL (ref 8.4–10.5)
GFR calc non Af Amer: 43 mL/min — ABNORMAL LOW (ref >90)
Glucose, Bld: 253 mg/dL — ABNORMAL HIGH (ref 70–99)
Potassium: 4.3 mEq/L (ref 3.5–5.1)

## 2012-09-25 LAB — CBC
HCT: 30.8 % — ABNORMAL LOW (ref 36.0–46.0)
Hemoglobin: 10.2 g/dL — ABNORMAL LOW (ref 12.0–15.0)
MCH: 27.6 pg (ref 26.0–34.0)
MCHC: 33.1 g/dL (ref 30.0–36.0)

## 2012-09-25 MED ORDER — LEVOFLOXACIN 750 MG PO TABS: 750.0000 mg | ORAL_TABLET | Freq: Every day | ORAL | Status: DC

## 2012-09-25 MED ORDER — SIMVASTATIN 80 MG PO TABS: 80.0000 mg | ORAL_TABLET | Freq: Every day | ORAL | Status: DC

## 2012-09-25 MED ORDER — IOHEXOL 350 MG/ML SOLN
100.0000 mL | Freq: Once | INTRAVENOUS | Status: AC | PRN
  Administered 2012-09-25: 100 mL via INTRAVENOUS

## 2012-09-25 MED ORDER — LOSARTAN POTASSIUM 25 MG PO TABS: 25.0000 mg | ORAL_TABLET | Freq: Every day | ORAL | Status: DC

## 2012-09-25 MED ORDER — GLIPIZIDE ER 10 MG PO TB24: 10.0000 mg | ORAL_TABLET | Freq: Every day | ORAL | Status: DC

## 2012-09-25 MED ORDER — ALBUTEROL SULFATE HFA 108 (90 BASE) MCG/ACT IN AERS
2.0000 | INHALATION_SPRAY | RESPIRATORY_TRACT | Status: AC
  Administered 2012-09-25: 2 via RESPIRATORY_TRACT
  Filled 2012-09-25: qty 6.7

## 2012-09-25 MED ORDER — METFORMIN HCL 1000 MG PO TABS: 1000.0000 mg | ORAL_TABLET | Freq: Two times a day (BID) | ORAL | Status: DC

## 2012-09-25 MED ORDER — CARVEDILOL 6.25 MG PO TABS: 6.2500 mg | ORAL_TABLET | Freq: Two times a day (BID) | ORAL | Status: DC

## 2012-09-25 MED ORDER — IOHEXOL 350 MG/ML SOLN: 100.0000 mL | Freq: Once | INTRAVENOUS | Status: DC | PRN

## 2012-09-25 MED ORDER — LEVOFLOXACIN 500 MG PO TABS
750.0000 mg | ORAL_TABLET | Freq: Once | ORAL | Status: DC
  Filled 2012-09-25: qty 2

## 2012-09-25 NOTE — ED Provider Notes (Signed)
History     CSN: [id number]  Arrival date & time 09/25/12  1128   First MD Initiated Contact with Patient 09/25/12 1154      Chief Complaint  Patient presents with  . Hypertension  . Shortness of Breath    (Consider location/radiation/quality/duration/timing/severity/associated sxs/prior treatment) HPI Pt presents with c/o shortness of breath.  She states she has been feeling short of breath over the past several weeks.  She was treated for pneumonia/bronchitis several weeks ago and states she feels much better than she did then.  No fever/chills. No leg swelling.  No chest pain.  She does feel somewhat more short of breath when walking.  Pt states she does not really feel any different than her baseline, but her daughter is concerned.  There are no other associated systemic symptoms, there are no other alleviating or modifying factors. Pt is also hypertensive and states she has run out of her BP meds.  She is changing PMDs and states she was there this morning, but was told to come to the ED because her BP was elevated.    Past Medical History  Diagnosis Date  . Hypertension   . Diabetes mellitus   . Hyperlipemia   . Cancer     endo ca    Past Surgical History  Procedure Date  . Abdominal hysterectomy   . Leg tendon surgery     patient fell in a store right leg  . Leg surgery     hole in bone( during Childhood)    No family history on file.  History  Substance Use Topics  . Smoking status: Never Smoker   . Smokeless tobacco: Never Used  . Alcohol Use: No    OB History    Grav Para Term Preterm Abortions TAB SAB Ect Mult Living                  Review of Systems ROS reviewed and all otherwise negative except for mentioned in HPI  Allergies  Review of patient's allergies indicates no known allergies.  Home Medications   Current Outpatient Rx  Name  Route  Sig  Dispense  Refill  . VITAMIN D3 PO   Oral   Take 1 tablet by mouth once a week. Takes on  Thursday         . DICLOFENAC-MISOPROSTOL 75-0.2 MG PO TBEC   Oral   Take 1 tablet by mouth daily.         Marland Kitchen FERROUS SULFATE 325 (65 FE) MG PO TABS   Oral   Take 1 tablet (325 mg total) by mouth daily.   30 tablet   0   . OMEGA-3 FATTY ACIDS 1000 MG PO CAPS   Oral   Take 2 g by mouth daily.         Marland Kitchen GABAPENTIN 100 MG PO CAPS   Oral   Take 100 mg by mouth 3 (three) times daily.         . INSULIN GLARGINE 100 UNIT/ML [place] SOLN   Subcutaneous   Inject 30 Units into the skin at bedtime.           Marland Kitchen METFORMIN HCL 1000 MG PO TABS   Oral   Take 1 tablet (1,000 mg total) by mouth 2 (two) times daily.   30 tablet   0   . NITROGLYCERIN 0.4 MG SL SUBL   Sublingual   Place 0.4 mg under the tongue every 5 (five) minutes as needed. For  chest pain         . CARVEDILOL 6.25 MG PO TABS   Oral   Take 1 tablet (6.25 mg total) by mouth 2 (two) times daily with a meal.   60 tablet   7   . GLIPIZIDE ER 10 MG PO TB24   Oral   Take 1 tablet (10 mg total) by mouth daily.   30 tablet   0   . LEVOFLOXACIN 750 MG PO TABS   Oral   Take 1 tablet (750 mg total) by mouth daily.   5 tablet   0   . LOSARTAN POTASSIUM 25 MG PO TABS   Oral   Take 1 tablet (25 mg total) by mouth daily.   30 tablet   6   . METFORMIN HCL 1000 MG PO TABS   Oral   Take 1 tablet (1,000 mg total) by mouth 2 (two) times daily.   60 tablet   0   . SIMVASTATIN 80 MG PO TABS   Oral   Take 1 tablet (80 mg total) by mouth daily.   30 tablet   0     BP 184/89  Pulse 82  Temp 98 F (36.7 C) (Oral)  Resp 20  SpO2 98% Vitals reviewed Physical Exam Physical Examination: General appearance - alert, well appearing, and in no distress Mental status - alert, oriented to person, place, and time Eyes - no scleral icterus, no conjunctival injection Mouth - mucous membranes moist, pharynx normal without lesions Chest - clear to auscultation, no wheezes, rales or rhonchi, symmetric air entry, no  increased respiratory effort Heart - normal rate, regular rhythm, normal S1, S2, no murmurs, rubs, clicks or gallops Abdomen - soft, nontender, nondistended, no masses or organomegaly Extremities - peripheral pulses normal, no pedal edema, no clubbing or cyanosis Skin - normal coloration and turgor, no rashes  ED Course  Procedures (including critical care time)   Date: 09/25/2012  Rate: 84  Rhythm: normal sinus rhythm  QRS Axis: normal  Intervals: normal  ST/T Wave abnormalities: nonspecific T wave changes  Conduction Disutrbances:none  Narrative Interpretation:   Old EKG Reviewed: unchanged compared to prior ekg of 07/20/12    Labs Reviewed  CBC - Abnormal; Notable for the following:    RBC 3.69 (*)     Hemoglobin 10.2 (*)     HCT 30.8 (*)     RDW 15.9 (*)     All other components within normal limits  BASIC METABOLIC PANEL - Abnormal; Notable for the following:    Glucose, Bld 253 (*)     BUN 31 (*)     Creatinine, Ser 1.30 (*)     GFR calc non Af Amer 43 (*)     GFR calc Af Amer 50 (*)     All other components within normal limits  PRO B NATRIURETIC PEPTIDE - Abnormal; Notable for the following:    Pro B Natriuretic peptide (BNP) 1010.0 (*)     All other components within normal limits  D-DIMER, QUANTITATIVE - Abnormal; Notable for the following:    D-Dimer, Quant 0.75 (*)     All other components within normal limits  LAB REPORT - SCANNED   Dg Chest 2 View  09/25/2012  *RADIOLOGY REPORT*  Clinical Data: Shortness of breath.  Hypertension.  Ex-smoker.  CHEST - 2 VIEW  Comparison: 07/20/2012.  Findings: Stable borderline enlarged cardiac silhouette and prominence of the pulmonary vasculature and interstitial markings. Stable linear scarring and small  amount of pleural thickening at the right lung base.  Slightly less prominent right hilum. Thoracic spine degenerative changes.  IMPRESSION:  1.  No acute abnormality. 2.  Stable borderline cardiomegaly, pulmonary vascular  congestion and chronic interstitial lung disease. 3.  Slightly less prominent right hilum.   Original Report Authenticated By: Claudie Revering, M.D.    Ct Angio Chest W/cm &/or Wo Cm  09/25/2012  *RADIOLOGY REPORT*  Clinical Data: Shortness of breath.  Mildly prominent right hilum on current and previous chest radiographs.  CT ANGIOGRAPHY CHEST  Technique:  Multidetector CT imaging of the chest using the standard protocol during bolus administration of intravenous contrast. Multiplanar reconstructed images including MIPs were obtained and reviewed to evaluate the vascular anatomy.  Contrast: 169mL OMNIPAQUE IOHEXOL 350 MG/ML SOLN  Comparison: Chest radiographs obtained earlier today.  Findings: Normally opacified pulmonary arteries with no pulmonary arterial filling defects seen.  Multiple enlarged mediastinal and bilateral hilar lymph nodes.  These include a 1.1 cm short axis AP window lymph node on image number 32, 1.5 cm short axis right hilar lymph node on image number 40, 1.0 cm short axis left hilar lymph node on image number 35 and 1.5 cm short-axis subcarinal lymph node on image number 36.  No enlarged axillary lymph nodes are seen. There are prominent bilateral supraclavicular lymph nodes.  The largest is a supraclavicular lymph node in the left lower neck, beneath the sternocleidomastoid muscle, measuring 1.6 cm short axis diameter on image #1.  Linear scarring at the right lung base.  Mild left basilar atelectasis. Additional  mild patchy reticulonodular densities in the left lower lobe.  The interstitial markings are mildly prominent.  There are also mild bullous changes bilaterally. No lung masses are seen.  Extensive thoracic spine degenerative changes.  Images through the upper abdomen are unremarkable.  IMPRESSION:  1.  No pulmonary emboli seen. 2.  Mediastinal, hilar and supraclavicular adenopathy, as described above.  Differential considerations include sarcoidosis, lymphoproliferative disorders and  metastatic disease. 3. Mild amount of patchy and reticular nodular density in the left lower lobe, possibly representing an active infectious process. 4.  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 5.  Mild left lower lobe atelectasis and right basilar scarring.   Original Report Authenticated By: Claudie Revering, M.D.      1. Hypertension   2. Shortness of breath   3. CAP (community acquired pneumonia)       MDM  Pt presents with c/o shortness of breath which has been ongoing for the past several weeks.  She is hypertensive and has run out of several medications.  I have written rx for these.  Her CXR shows nothing acute- some chronic pulmonary vascular congestion which is unchanged.  Her EKG and troponin are reassuring.  D-dimer elevated, pt is having CT angio of chest.  This was signed out to Dr. Thurnell Garbe.  If negative, she may be discharged with close f/u with PMD.          Threasa Beards, MD 09/26/12 [phone]

## 2012-09-25 NOTE — ED Provider Notes (Signed)
61yo F, with pt's daughter c/o pt SOB for the past month. Pt states she is "just here for some meds refills."  Pt states she felt "better" after abx last month for pneumonia.  Pt talking on the phone, speaking full sentences with ease, Sats 97-99% R/A while talking, resps easy, NAD, non-toxic appearing, lungs CTA bilat. CT-A chest without PE or pulm vasc congestion, but with possible pneumonia.  VSS. Pt wants to go home now.  Will dose PO levaquin and teach/treat MDI.  Meds refills rx given.  Dx and testing d/w pt and family.  Questions answered.  Verb understanding, agreeable to d/c home with outpt f/u.   Alfonzo Feller, DO 09/25/12 1724

## 2012-09-25 NOTE — ED Notes (Signed)
Pt presents to ED with c/o shortness of breath, and high blood pressure, Pt sts she was diagnosed with pneumonia last month. Pt sts she is using inhaler at home but per daughter it's not helping. Daughter sts pt is having difficulty sleeping due to shortness of breath. Pt sts hx of MI in 2010.

## 2013-03-18 ENCOUNTER — Emergency Department (HOSPITAL_COMMUNITY): Payer: Medicare Other

## 2013-03-18 ENCOUNTER — Inpatient Hospital Stay (HOSPITAL_COMMUNITY): Payer: Medicare Other

## 2013-03-18 ENCOUNTER — Encounter (HOSPITAL_COMMUNITY): Payer: Self-pay | Admitting: Emergency Medicine

## 2013-03-18 ENCOUNTER — Inpatient Hospital Stay (HOSPITAL_COMMUNITY)
Admission: EM | Admit: 2013-03-18 | Discharge: 2013-03-23 | DRG: 065 | Disposition: A | Discharge status: Skilled Nursing Facility | Payer: Medicare Other | Attending: Internal Medicine | Admitting: Internal Medicine

## 2013-03-18 DIAGNOSIS — N179 Acute kidney failure, unspecified: Secondary | ICD-10-CM | POA: Diagnosis present

## 2013-03-18 DIAGNOSIS — I129 Hypertensive chronic kidney disease with stage 1 through stage 4 chronic kidney disease, or unspecified chronic kidney disease: Secondary | ICD-10-CM | POA: Diagnosis present

## 2013-03-18 DIAGNOSIS — E785 Hyperlipidemia, unspecified: Secondary | ICD-10-CM | POA: Diagnosis present

## 2013-03-18 DIAGNOSIS — I639 Cerebral infarction, unspecified: Secondary | ICD-10-CM

## 2013-03-18 DIAGNOSIS — I517 Cardiomegaly: Secondary | ICD-10-CM

## 2013-03-18 DIAGNOSIS — E119 Type 2 diabetes mellitus without complications: Secondary | ICD-10-CM

## 2013-03-18 DIAGNOSIS — I10 Essential (primary) hypertension: Secondary | ICD-10-CM

## 2013-03-18 DIAGNOSIS — R739 Hyperglycemia, unspecified: Secondary | ICD-10-CM

## 2013-03-18 DIAGNOSIS — R112 Nausea with vomiting, unspecified: Secondary | ICD-10-CM | POA: Diagnosis present

## 2013-03-18 DIAGNOSIS — I509 Heart failure, unspecified: Secondary | ICD-10-CM | POA: Diagnosis present

## 2013-03-18 DIAGNOSIS — E1142 Type 2 diabetes mellitus with diabetic polyneuropathy: Secondary | ICD-10-CM | POA: Diagnosis present

## 2013-03-18 DIAGNOSIS — D638 Anemia in other chronic diseases classified elsewhere: Secondary | ICD-10-CM | POA: Diagnosis present

## 2013-03-18 DIAGNOSIS — N183 Chronic kidney disease, stage 3 unspecified: Secondary | ICD-10-CM | POA: Diagnosis present

## 2013-03-18 DIAGNOSIS — R51 Headache: Secondary | ICD-10-CM

## 2013-03-18 DIAGNOSIS — E1149 Type 2 diabetes mellitus with other diabetic neurological complication: Secondary | ICD-10-CM | POA: Diagnosis present

## 2013-03-18 DIAGNOSIS — N189 Chronic kidney disease, unspecified: Secondary | ICD-10-CM | POA: Diagnosis present

## 2013-03-18 DIAGNOSIS — I5042 Chronic combined systolic (congestive) and diastolic (congestive) heart failure: Secondary | ICD-10-CM | POA: Diagnosis present

## 2013-03-18 DIAGNOSIS — R519 Headache, unspecified: Secondary | ICD-10-CM | POA: Insufficient documentation

## 2013-03-18 DIAGNOSIS — R531 Weakness: Secondary | ICD-10-CM

## 2013-03-18 DIAGNOSIS — I6350 Cerebral infarction due to unspecified occlusion or stenosis of unspecified cerebral artery: Principal | ICD-10-CM | POA: Diagnosis present

## 2013-03-18 DIAGNOSIS — D72829 Elevated white blood cell count, unspecified: Secondary | ICD-10-CM | POA: Diagnosis present

## 2013-03-18 DIAGNOSIS — E1101 Type 2 diabetes mellitus with hyperosmolarity with coma: Secondary | ICD-10-CM | POA: Diagnosis present

## 2013-03-18 HISTORY — DX: Cerebral infarction, unspecified: I63.9

## 2013-03-18 LAB — BASIC METABOLIC PANEL
CO2: 25 mEq/L (ref 19–32)
Chloride: 103 mEq/L (ref 96–112)
GFR calc Af Amer: 44 mL/min — ABNORMAL LOW (ref >90)
Potassium: 4.2 mEq/L (ref 3.5–5.1)
Sodium: 141 mEq/L (ref 135–145)

## 2013-03-18 LAB — COMPREHENSIVE METABOLIC PANEL
ALT: 9 U/L (ref 0–35)
AST: 11 U/L (ref 0–37)
Albumin: 2.8 g/dL — ABNORMAL LOW (ref 3.5–5.2)
Calcium: 9.7 mg/dL (ref 8.4–10.5)
GFR calc Af Amer: 42 mL/min — ABNORMAL LOW (ref >90)
Potassium: 4.1 mEq/L (ref 3.5–5.1)
Sodium: 137 mEq/L (ref 135–145)
Total Protein: 7.2 g/dL (ref 6.0–8.3)

## 2013-03-18 LAB — URINALYSIS, ROUTINE W REFLEX MICROSCOPIC
Bilirubin Urine: NEGATIVE
Glucose, UA: >1000 mg/dL — AB
Ketones, ur: NEGATIVE mg/dL
Ketones, ur: NEGATIVE mg/dL
Leukocytes, UA: NEGATIVE
Nitrite: NEGATIVE
Protein, ur: >300 mg/dL — AB
Specific Gravity, Urine: 1.02 (ref 1.005–1.030)
Urobilinogen, UA: 0.2 mg/dL (ref 0.0–1.0)
pH: 6 (ref 5.0–8.0)

## 2013-03-18 LAB — GLUCOSE, CAPILLARY
Glucose-Capillary: 423 mg/dL — ABNORMAL HIGH (ref 70–99)
Glucose-Capillary: 279 mg/dL — ABNORMAL HIGH (ref 70–99)

## 2013-03-18 LAB — URINE MICROSCOPIC-ADD ON

## 2013-03-18 LAB — POCT I-STAT TROPONIN I: Troponin i, poc: 0 ng/mL (ref 0.00–0.08)

## 2013-03-18 LAB — CBC WITH DIFFERENTIAL/PLATELET
Basophils Relative: 0 % (ref 0–1)
Eosinophils Relative: 2 % (ref 0–5)
HCT: 30.8 % — ABNORMAL LOW (ref 36.0–46.0)
Hemoglobin: 10.4 g/dL — ABNORMAL LOW (ref 12.0–15.0)
MCH: 28.3 pg (ref 26.0–34.0)
MCHC: 33.8 g/dL (ref 30.0–36.0)
MCV: 83.9 fL (ref 78.0–100.0)
Monocytes Absolute: 0.7 10*3/uL (ref 0.1–1.0)
Monocytes Relative: 6 % (ref 3–12)
Neutro Abs: 8.5 10*3/uL — ABNORMAL HIGH (ref 1.7–7.7)

## 2013-03-18 LAB — PROTIME-INR: INR: 0.95 (ref 0.00–1.49)

## 2013-03-18 MED ORDER — CARVEDILOL 6.25 MG PO TABS
6.2500 mg | ORAL_TABLET | Freq: Two times a day (BID) | ORAL | Status: DC
  Administered 2013-03-18 – 2013-03-23 (×10): 6.25 mg via ORAL
  Filled 2013-03-18 (×12): qty 1

## 2013-03-18 MED ORDER — MORPHINE SULFATE 4 MG/ML IJ SOLN
4.0000 mg | Freq: Once | INTRAMUSCULAR | Status: AC
  Administered 2013-03-18: 4 mg via INTRAVENOUS
  Filled 2013-03-18: qty 1

## 2013-03-18 MED ORDER — INSULIN GLARGINE 100 UNIT/ML ~~LOC~~ SOLN
30.0000 [IU] | Freq: Every day | SUBCUTANEOUS | Status: DC
  Filled 2013-03-18: qty 0.3

## 2013-03-18 MED ORDER — DEXTROSE 50 % IV SOLN: 25.0000 mL | INTRAVENOUS | Status: DC | PRN

## 2013-03-18 MED ORDER — FERROUS SULFATE 325 (65 FE) MG PO TABS
325.0000 mg | ORAL_TABLET | Freq: Every day | ORAL | Status: DC
  Administered 2013-03-19 – 2013-03-23 (×5): 325 mg via ORAL
  Filled 2013-03-18 (×6): qty 1

## 2013-03-18 MED ORDER — METOCLOPRAMIDE HCL 5 MG/ML IJ SOLN
10.0000 mg | Freq: Once | INTRAMUSCULAR | Status: AC
  Administered 2013-03-18: 10 mg via INTRAVENOUS
  Filled 2013-03-18: qty 2

## 2013-03-18 MED ORDER — ONDANSETRON HCL 4 MG/2ML IJ SOLN
4.0000 mg | Freq: Once | INTRAMUSCULAR | Status: AC
  Administered 2013-03-18: 4 mg via INTRAVENOUS
  Filled 2013-03-18: qty 2

## 2013-03-18 MED ORDER — DEXTROSE-NACL 5-0.45 % IV SOLN: INTRAVENOUS | Status: DC

## 2013-03-18 MED ORDER — SODIUM CHLORIDE 0.9 % IV SOLN: INTRAVENOUS | Status: DC

## 2013-03-18 MED ORDER — DICLOFENAC-MISOPROSTOL 75-0.2 MG PO TBEC: 1.0000 | DELAYED_RELEASE_TABLET | Freq: Every day | ORAL | Status: DC

## 2013-03-18 MED ORDER — POTASSIUM CHLORIDE 10 MEQ/100ML IV SOLN
10.0000 meq | INTRAVENOUS | Status: AC
  Filled 2013-03-18 (×2): qty 100

## 2013-03-18 MED ORDER — SODIUM CHLORIDE 0.9 % IV SOLN
INTRAVENOUS | Status: DC
  Administered 2013-03-18 – 2013-03-22 (×5): via INTRAVENOUS

## 2013-03-18 MED ORDER — GABAPENTIN 100 MG PO CAPS
100.0000 mg | ORAL_CAPSULE | Freq: Three times a day (TID) | ORAL | Status: DC
  Filled 2013-03-18 (×3): qty 1

## 2013-03-18 MED ORDER — SODIUM CHLORIDE 0.9 % IV BOLUS (SEPSIS)
1000.0000 mL | Freq: Once | INTRAVENOUS | Status: AC
  Administered 2013-03-18: 1000 mL via INTRAVENOUS

## 2013-03-18 MED ORDER — LOSARTAN POTASSIUM 25 MG PO TABS
25.0000 mg | ORAL_TABLET | Freq: Every day | ORAL | Status: DC
Start: 2013-03-18 — End: 2013-03-18
  Filled 2013-03-18 (×2): qty 1

## 2013-03-18 MED ORDER — ONDANSETRON HCL 4 MG/2ML IJ SOLN
4.0000 mg | Freq: Four times a day (QID) | INTRAMUSCULAR | Status: DC | PRN
  Administered 2013-03-18 – 2013-03-21 (×5): 4 mg via INTRAVENOUS
  Filled 2013-03-18 (×5): qty 2

## 2013-03-18 MED ORDER — SODIUM CHLORIDE 0.9 % IV SOLN
INTRAVENOUS | Status: DC
  Administered 2013-03-18: 4.4 [IU]/h via INTRAVENOUS
  Filled 2013-03-18: qty 1

## 2013-03-18 MED ORDER — MORPHINE SULFATE 2 MG/ML IJ SOLN: 1.0000 mg | INTRAMUSCULAR | Status: DC | PRN

## 2013-03-18 MED ORDER — FERROUS SULFATE 325 (65 FE) MG PO TABS
325.0000 mg | ORAL_TABLET | Freq: Every day | ORAL | Status: DC
Start: 2013-03-18 — End: 2013-03-18
  Filled 2013-03-18 (×2): qty 1

## 2013-03-18 MED ORDER — CARVEDILOL 6.25 MG PO TABS
6.2500 mg | ORAL_TABLET | Freq: Two times a day (BID) | ORAL | Status: DC
  Filled 2013-03-18 (×3): qty 1

## 2013-03-18 MED ORDER — OMEGA-3 FATTY ACIDS 1000 MG PO CAPS: 2.0000 g | ORAL_CAPSULE | Freq: Every day | ORAL | Status: DC

## 2013-03-18 MED ORDER — SODIUM CHLORIDE 0.9 % IV SOLN
INTRAVENOUS | Status: DC
  Administered 2013-03-18: 17:00:00 via INTRAVENOUS

## 2013-03-18 MED ORDER — INSULIN GLARGINE 100 UNIT/ML ~~LOC~~ SOLN
30.0000 [IU] | Freq: Every day | SUBCUTANEOUS | Status: DC
  Administered 2013-03-18: 30 [IU] via SUBCUTANEOUS
  Filled 2013-03-18: qty 0.3

## 2013-03-18 MED ORDER — ATORVASTATIN CALCIUM 40 MG PO TABS
40.0000 mg | ORAL_TABLET | Freq: Every day | ORAL | Status: DC
  Administered 2013-03-18 – 2013-03-22 (×5): 40 mg via ORAL
  Filled 2013-03-18 (×6): qty 1

## 2013-03-18 MED ORDER — OMEGA-3-ACID ETHYL ESTERS 1 G PO CAPS
1.0000 g | ORAL_CAPSULE | Freq: Every day | ORAL | Status: DC
  Filled 2013-03-18 (×2): qty 1

## 2013-03-18 MED ORDER — ONDANSETRON HCL 4 MG PO TABS: 4.0000 mg | ORAL_TABLET | Freq: Four times a day (QID) | ORAL | Status: DC | PRN

## 2013-03-18 MED ORDER — LOSARTAN POTASSIUM 25 MG PO TABS
25.0000 mg | ORAL_TABLET | Freq: Every day | ORAL | Status: DC
  Administered 2013-03-18 – 2013-03-20 (×3): 25 mg via ORAL
  Filled 2013-03-18 (×3): qty 1

## 2013-03-18 MED ORDER — GABAPENTIN 100 MG PO CAPS
100.0000 mg | ORAL_CAPSULE | Freq: Three times a day (TID) | ORAL | Status: DC
  Administered 2013-03-18 – 2013-03-23 (×14): 100 mg via ORAL
  Filled 2013-03-18 (×16): qty 1

## 2013-03-18 MED ORDER — SODIUM CHLORIDE 0.9 % IV SOLN
1000.0000 mL | INTRAVENOUS | Status: DC
  Administered 2013-03-18: 1000 mL via INTRAVENOUS

## 2013-03-18 MED ORDER — INSULIN GLARGINE 100 UNIT/ML ~~LOC~~ SOLN
30.0000 [IU] | Freq: Every day | SUBCUTANEOUS | Status: DC
  Administered 2013-03-19: 30 [IU] via SUBCUTANEOUS
  Filled 2013-03-18: qty 0.3

## 2013-03-18 MED ORDER — INSULIN ASPART 100 UNIT/ML ~~LOC~~ SOLN
0.0000 [IU] | Freq: Three times a day (TID) | SUBCUTANEOUS | Status: DC
  Administered 2013-03-19 (×2): 7 [IU] via SUBCUTANEOUS
  Administered 2013-03-19 – 2013-03-20 (×2): 15 [IU] via SUBCUTANEOUS
  Administered 2013-03-20: 4 [IU] via SUBCUTANEOUS
  Administered 2013-03-20: 5 [IU] via SUBCUTANEOUS
  Administered 2013-03-21 (×2): 7 [IU] via SUBCUTANEOUS
  Administered 2013-03-21 – 2013-03-22 (×3): 4 [IU] via SUBCUTANEOUS
  Administered 2013-03-22: 11 [IU] via SUBCUTANEOUS
  Administered 2013-03-23 (×2): 4 [IU] via SUBCUTANEOUS

## 2013-03-18 MED ORDER — INSULIN GLARGINE 100 UNIT/ML ~~LOC~~ SOLN: 30.0000 [IU] | Freq: Every day | SUBCUTANEOUS | Status: DC

## 2013-03-18 MED ORDER — DIPHENHYDRAMINE HCL 50 MG/ML IJ SOLN
12.5000 mg | Freq: Once | INTRAMUSCULAR | Status: AC
  Administered 2013-03-18: 12.5 mg via INTRAVENOUS
  Filled 2013-03-18: qty 1

## 2013-03-18 MED ORDER — HYDROCODONE-ACETAMINOPHEN 5-325 MG PO TABS: 1.0000 | ORAL_TABLET | ORAL | Status: DC | PRN

## 2013-03-18 MED ORDER — SODIUM CHLORIDE 0.9 % IV SOLN
INTRAVENOUS | Status: DC
  Administered 2013-03-18: 3.5 [IU]/h via INTRAVENOUS
  Filled 2013-03-18: qty 1

## 2013-03-18 MED ORDER — ACETAMINOPHEN 325 MG PO TABS
650.0000 mg | ORAL_TABLET | Freq: Four times a day (QID) | ORAL | Status: DC | PRN
Start: 2013-03-18 — End: 2013-03-23
  Administered 2013-03-20 – 2013-03-23 (×3): 650 mg via ORAL
  Filled 2013-03-18 (×3): qty 2

## 2013-03-18 MED ORDER — MORPHINE SULFATE 4 MG/ML IJ SOLN: 4.0000 mg | INTRAMUSCULAR | Status: DC | PRN

## 2013-03-18 MED ORDER — ACETAMINOPHEN 650 MG RE SUPP: 650.0000 mg | Freq: Four times a day (QID) | RECTAL | Status: DC | PRN

## 2013-03-18 MED ORDER — ATORVASTATIN CALCIUM 40 MG PO TABS
40.0000 mg | ORAL_TABLET | Freq: Every day | ORAL | Status: DC
  Filled 2013-03-18 (×2): qty 1

## 2013-03-18 MED ORDER — OMEGA-3-ACID ETHYL ESTERS 1 G PO CAPS
1.0000 g | ORAL_CAPSULE | Freq: Every day | ORAL | Status: DC
  Administered 2013-03-18 – 2013-03-23 (×6): 1 g via ORAL
  Filled 2013-03-18 (×6): qty 1

## 2013-03-18 MED ORDER — GABAPENTIN 100 MG PO CAPS
100.0000 mg | ORAL_CAPSULE | Freq: Three times a day (TID) | ORAL | Status: DC
  Filled 2013-03-18: qty 1

## 2013-03-18 MED ORDER — POTASSIUM CHLORIDE 10 MEQ/100ML IV SOLN
10.0000 meq | INTRAVENOUS | Status: DC
  Filled 2013-03-18 (×2): qty 100

## 2013-03-18 MED ORDER — INSULIN ASPART 100 UNIT/ML ~~LOC~~ SOLN
0.0000 [IU] | Freq: Every day | SUBCUTANEOUS | Status: DC
  Administered 2013-03-18: 22:00:00 via SUBCUTANEOUS
  Administered 2013-03-20: 2 [IU] via SUBCUTANEOUS

## 2013-03-18 MED ORDER — SODIUM CHLORIDE 0.9 % IV SOLN
1000.0000 mL | Freq: Once | INTRAVENOUS | Status: AC
  Administered 2013-03-18: 1000 mL via INTRAVENOUS

## 2013-03-18 NOTE — Progress Notes (Signed)
*  PRELIMINARY RESULTS* Vascular Ultrasound Carotid Duplex (Doppler) has been completed.  Preliminary findings: Bilateral:  No evidence of hemodynamically significant internal carotid artery stenosis.   Vertebral artery flow is antegrade.     Landry Mellow, RDMS, RVT  03/18/2013, 3:28 PM

## 2013-03-18 NOTE — Progress Notes (Signed)
Echocardiogram 2D Echocardiogram has been performed.  Lajeana Strough 03/18/2013, 4:16 PM

## 2013-03-18 NOTE — ED Provider Notes (Addendum)
History     CSN: [id number] Arrival date & time 03/18/13  [id number] First MD Initiated Contact with Patient 03/18/13 0805      Chief Complaint  Patient presents with  . Weakness  . Hyperglycemia  . Emesis     HPI Pt started feeling dizzy at 6am this morning.  No room spinning but lightheaded.  She started to develop a headache as well.  The headache is located in the front of her head.  Gradual in onset.  The headache is becoming severe She then started having some vomiting. NO stomach ache or diarrhea.  No fevers.  No history of trouble like this. Patient has had trouble before with her blood pressure as well as her blood sugar. She had not taken her insulin recently because she was starting to run low. She was trying to ration it.    Patient feels very this morning. She is now vomiting frequently. Past Medical History  Diagnosis Date  . Hypertension   . Diabetes mellitus   . Hyperlipemia   . Cancer     endo ca    Past Surgical History  Procedure Laterality Date  . Abdominal hysterectomy    . Leg tendon surgery      patient fell in a store right leg  . Leg surgery      hole in bone( during Childhood)    No family history on file.  History  Substance Use Topics  . Smoking status: Never Smoker   . Smokeless tobacco: Never Used  . Alcohol Use: No    OB History   Grav Para Term Preterm Abortions TAB SAB Ect Mult Living                  Review of Systems  Allergies  Review of patient's allergies indicates no known allergies.  Home Medications   Current Outpatient Rx  Name  Route  Sig  Dispense  Refill  . insulin glargine (LANTUS) 100 UNIT/ML injection   Subcutaneous   Inject 30 Units into the skin at bedtime.           . carvedilol (COREG) 6.25 MG tablet   Oral   Take 1 tablet (6.25 mg total) by mouth 2 (two) times daily with a meal.   60 tablet   7   . Cholecalciferol (VITAMIN D3 PO)   Oral   Take 1 tablet by mouth once a week. Takes on Thursday          . Diclofenac-Misoprostol 75-0.2 MG TBEC   Oral   Take 1 tablet by mouth daily.         . ferrous sulfate 325 (65 FE) MG tablet   Oral   Take 1 tablet (325 mg total) by mouth daily.   30 tablet   0   . fish oil-omega-3 fatty acids 1000 MG capsule   Oral   Take 2 g by mouth daily.         Marland Kitchen gabapentin (NEURONTIN) 100 MG capsule   Oral   Take 100 mg by mouth 3 (three) times daily.         Marland Kitchen glipiZIDE (GLUCOTROL XL) 10 MG 24 hr tablet   Oral   Take 1 tablet (10 mg total) by mouth daily.   30 tablet   0   . levofloxacin (LEVAQUIN) 750 MG tablet   Oral   Take 1 tablet (750 mg total) by mouth daily.   5 tablet   0   .  losartan (COZAAR) 25 MG tablet   Oral   Take 1 tablet (25 mg total) by mouth daily.   30 tablet   6   . metFORMIN (GLUCOPHAGE) 1000 MG tablet   Oral   Take 1 tablet (1,000 mg total) by mouth 2 (two) times daily.   30 tablet   0   . metFORMIN (GLUCOPHAGE) 1000 MG tablet   Oral   Take 1 tablet (1,000 mg total) by mouth 2 (two) times daily.   60 tablet   0   . nitroGLYCERIN (NITROSTAT) 0.4 MG SL tablet   Sublingual   Place 0.4 mg under the tongue every 5 (five) minutes as needed. For chest pain         . simvastatin (ZOCOR) 80 MG tablet   Oral   Take 1 tablet (80 mg total) by mouth daily.   30 tablet   0     BP 165/85  Pulse 78  Temp(Src) 98.5 F (36.9 C) (Oral)  Resp 18  SpO2 96%  Physical Exam  Nursing note and vitals reviewed. Constitutional: She is oriented to person, place, and time. She appears distressed.  HENT:  Head: Normocephalic and atraumatic.  Right Ear: External ear normal.  Left Ear: External ear normal.  Eyes: Conjunctivae and EOM are normal. Pupils are equal, round, and reactive to light. Right eye exhibits no discharge. Left eye exhibits no discharge. No scleral icterus.  Neck: Neck supple. No tracheal deviation present.  Cardiovascular: Normal rate, regular rhythm and intact distal pulses.    Pulmonary/Chest: Effort normal and breath sounds normal. No stridor. No respiratory distress. She has no wheezes. She has no rales.  Abdominal: Soft. Bowel sounds are normal. She exhibits no distension. There is no tenderness. There is no rebound and no guarding.  Vomiting at the bedside  Musculoskeletal: She exhibits no edema and no tenderness.  Neurological: She is alert and oriented to person, place, and time. She has normal strength. No cranial nerve deficit or sensory deficit. She exhibits normal muscle tone. She displays no seizure activity. Coordination normal.  Skin: Skin is warm and dry. No rash noted. She is not diaphoretic.  Psychiatric: She has a normal mood and affect.    ED Course  Procedures (including critical care time) EKG Normal sinus rhythm rate 84 Right atrial abnormality Borderline repolarization abnormality No significant change when compared to EKG dated 09/25/2012  Medications  0.9 %  sodium chloride infusion (1,000 mLs Intravenous New Bag/Given 03/18/13 0915)    Followed by  0.9 %  sodium chloride infusion (1,000 mLs Intravenous New Bag/Given 03/18/13 1046)  insulin glargine (LANTUS) injection 30 Units (30 Units Subcutaneous Given 03/18/13 1035)  insulin regular (NOVOLIN R,HUMULIN R) 1 Units/mL in sodium chloride 0.9 % 100 mL infusion (not administered)  metoCLOPramide (REGLAN) injection 10 mg (not administered)  diphenhydrAMINE (BENADRYL) injection 12.5 mg (not administered)  morphine 4 MG/ML injection 4 mg (not administered)  sodium chloride 0.9 % bolus 1,000 mL (0 mLs Intravenous Stopped 03/18/13 0919)  ondansetron (ZOFRAN) injection 4 mg (4 mg Intravenous Given 03/18/13 0837)  morphine 4 MG/ML injection 4 mg (4 mg Intravenous Given 03/18/13 0838)    Labs Reviewed  URINALYSIS, ROUTINE W REFLEX MICROSCOPIC - Abnormal; Notable for the following:    Glucose, UA >1000 (*)    Hgb urine dipstick TRACE (*)    Protein, ur 100 (*)    All other components within  normal limits  COMPREHENSIVE METABOLIC PANEL - Abnormal; Notable for the following:  Glucose, Bld 448 (*)    BUN 38 (*)    Creatinine, Ser 1.51 (*)    Albumin 2.8 (*)    Total Bilirubin 0.2 (*)    GFR calc non Af Amer 36 (*)    GFR calc Af Amer 42 (*)    All other components within normal limits  CBC WITH DIFFERENTIAL - Abnormal; Notable for the following:    WBC 11.6 (*)    RBC 3.67 (*)    Hemoglobin 10.4 (*)    HCT 30.8 (*)    Neutro Abs 8.5 (*)    All other components within normal limits  GLUCOSE, CAPILLARY - Abnormal; Notable for the following:    Glucose-Capillary 426 (*)    All other components within normal limits  URINE MICROSCOPIC-ADD ON - Abnormal; Notable for the following:    Squamous Epithelial / LPF FEW (*)    All other components within normal limits  GLUCOSE, CAPILLARY - Abnormal; Notable for the following:    Glucose-Capillary 414 (*)    All other components within normal limits  PROTIME-INR  APTT  TROPONIN I  POCT I-STAT TROPONIN I   Ct Head Wo Contrast  03/18/2013  *RADIOLOGY REPORT*  Clinical Data: Headache.  Diabetic hypertensive patient with hyperlipidemia.  Hyperglycemia.  CT HEAD WITHOUT CONTRAST  Technique:  Contiguous axial images were obtained from the base of the skull through the vertex without contrast.  Comparison: None.  Findings: No intracranial hemorrhage.  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  Remote tiny mid right corona radiata infarct.  Small vessel disease type changes.  No CT evidence of large acute infarct.  No intracranial mass lesion detected on this unenhanced exam.  No hydrocephalus.  Vascular calcifications.  Mastoid air cells, middle ear cavities and visualized sinuses are clear.  IMPRESSION: No intracranial hemorrhage.  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  Remote tiny mid right corona radiata infarct.  Small vessel disease type changes.  No CT evidence of large acute infarct.   Original  Report Authenticated By: Genia Del, M.D.      1. Hyperglycemia   2. Headache   3. Weakness       MDM  Hyperglycemia Pt continues to have elevated blood sugar.  Unable to keep po meds down .  No sign of dka at this time.   Will plan on admission for further evaluation.  IV fluids, insulin  Headache No sign of hemorrhage or abnormality on CT.  Symptoms not thunder clap in onset.  No neck pain.  Doubt SAH.  ?stroke with her dizziness in the vomiting.  Will order MRI.  Consult with medicine regarding admission.       Kathalene Frames, MD 03/18/13 1050  Addendum:  I was notified by Dr Nevada Crane about the MRI findings showing a cerebellar stroke.  I contacted Dr Charlies Silvers as the patient had already been admitted upstairs.  She will contact neurology.  Kathalene Frames, MD 03/18/13 [phone]

## 2013-03-18 NOTE — ED Notes (Signed)
Patient transported to MRI 

## 2013-03-18 NOTE — ED Notes (Signed)
[id number] Expected date:<BR> Expected time:<BR> Means of arrival:<BR> Comments:<BR> 61yo-weakness/leg pain/nausea

## 2013-03-18 NOTE — Progress Notes (Signed)
Alerted to patient transferring from [place] with new PICA stroke with early cytoxic edema.  Assessed on arrival - patient arouses but sleepy - states she had dizziness and unsteady gait this AM - now with headache - dizziness - supine in bed - RRR resps - warm and dry - BP 172/80 HR 93 RR 22 O2 sats 92% - patient has acrylic nails on - had nausea and vomiting at [facility] - treated with Zofran - still some nausea.  Also hyperglycemic - on Insulin drip.  Spoke with Dr. Nicole Kindred - feels high risk of neuro decompensation - and with added hyperglycemia treatment - suggested ICU management overnight for precise hyperglycemia management and close observation of neuro status.  Spoke with Dr. Aileen Fass - in agreement - He is present to see patient.  Handoff information to [employer].    Call RRT as needed.

## 2013-03-18 NOTE — ED Notes (Addendum)
Per EMS: Pt c/o of weakness  and headache, no LOC. CBG of 497. Pt states she was trying to savor her insulin so she didn't take it last night because she was running low. 20 g in rihgt [facility] 4mg  of Zofran with no relief.

## 2013-03-18 NOTE — H&P (Signed)
[facility] History and Physical  Kelcie Banaszak [id number] DOB: 10/23/1951 DOA: 03/18/2013  Referring physician: ER physician PCP: Philis Fendt, MD   Chief Complaint: Nausea, vomiting and dizziness  HPI:  Patient is a 62 year old female with past medical history significant for chronic kidney disease, chronic systolic and diastolic heart failure (2-D echo in 2011 with ejection fraction 55-60%), diabetes, hypertension who presented to [facility] ED 03/18/2013 with complaints of nausea, vomiting and dizziness started around 6:00 this morning prior to this admission. Patient reported associated headache, 7/10 in intensity, frontal, nonradiating. No blurry vision or double vision. No loss of consciousness. Patient had no complaints of slurred speech, weakness. No complaints of chest pain, shortness of breath or palpitations. No fever or chills. No complaints of abdominal pain, no diarrhea or constipation. In ED, evaluation included CT head which was significant for remote appearing right parietal - occipital lobe infarct but no acute intracranial findings. MRI of the brain was significant for moderate to large confluent right PICA cerebellar infarct and early cytotoxic edema. In addition, her CBGs were elevated 400 range. BMET revealed slightly elevated at 13. Patient was started on insulin drip on admission. Her creatinine was elevated at 1.51 which is slightly above her baseline of 1.3.  Assessment and plan:  Principal Problem:   Headache, nausea, vomiting and dizziness - This is likely secondary to new right PICA cerebellar infarct - Stroke team notified of patient's admission and recommendation is for transfer to [facility]. Patient should have repeat imaging studies in the morning to reevaluate her cytotoxic edema development. - Stroke order set in place - Followup carotid Doppler, 2-D echo, lipid panel, A1c Active Problems:   Chronic kidney disease, stage 3 - As mentioned above,  creatinine on this admission is 1.51 which is slightly above patient baseline of 1.3 - Continue IV fluids, followup BMP in the morning   Leukocytosis - check urinalysis and urine culture   Anemia of chronic disease - Secondary to chronic kidney disease - Hemoglobin 10.4 on this admission - No signs of bleed   DKA / DM with complications of chronic kidney disease and diabetic neuropathy - Started insulin drip - Transition to sliding scale per DKA protocol - BMP and CBG check per DKA protocol - Diabetic coordinator consult ordered   Diabetic neuropathy - Continue gabapentin   Dyslipidemia - Continue simvastatin   HYPERTENSION - Continue home medications, losartan   Chronic systolic and diastolic CHF - 2 D ECHO in 2011 with ejection fraction 55-60% - Followup 2-D echo on this admission  Code Status: Full Family Communication: Pt at bedside Disposition Plan: Admit for further evaluation  Leisa Lenz, MD  [facility] Pager [phone]  If 7PM-7AM, please contact night-coverage [email or web address] Password [email or web address] 03/18/2013, 12:11 PM  Review of Systems:  Constitutional: Negative for fever, chills and malaise/fatigue. Negative for diaphoresis.  HENT: Negative for hearing loss, ear pain, nosebleeds, congestion, sore throat, neck pain, tinnitus and ear discharge.   Eyes: Negative for blurred vision, double vision, photophobia, pain, discharge and redness.  Respiratory: Negative for cough, hemoptysis, sputum production, shortness of breath, wheezing and stridor.   Cardiovascular: Negative for chest pain, palpitations, orthopnea, claudication and leg swelling.  Gastrointestinal: Positive for nausea, vomiting and negative for abdominal pain. Negative for heartburn, constipation, blood in stool and melena.  Genitourinary: Negative for dysuria, urgency, frequency, hematuria and flank pain.  Musculoskeletal: Negative for myalgias, back pain, joint pain and falls.  Skin: Negative for itching and rash.   Neurological: Positive for dizziness  and weakness. No sensory changes, no speech difficulty, no tremors. Endo/Heme/Allergies: Negative for environmental allergies and polydipsia. Does not bruise/bleed easily.  Psychiatric/Behavioral: Negative for suicidal ideas. The patient is not nervous/anxious.     Past Medical History  Diagnosis Date  . Hypertension   . Diabetes mellitus   . Hyperlipemia   . Cancer     endo ca   Past Surgical History  Procedure Laterality Date  . Abdominal hysterectomy    . Leg tendon surgery      patient fell in a store right leg  . Leg surgery      hole in bone( during Childhood)   Social History:  reports that she has never smoked. She has never used smokeless tobacco. She reports that she does not drink alcohol or use illicit drugs.  No Known Allergies  Family History: no significant history of medical issues in family  Prior to Admission medications   Medication Sig Start Date End Date Taking? Authorizing Provider  insulin glargine (LANTUS) 100 UNIT/ML injection Inject 30 Units into the skin at bedtime.     Yes Historical Provider, MD  carvedilol (COREG) 6.25 MG tablet Take 1 tablet (6.25 mg total) by mouth 2 (two) times daily with a meal. 09/25/12   Threasa Beards, MD  Cholecalciferol (VITAMIN D3 PO) Take 1 tablet by mouth once a week. Takes on Thursday    Historical Provider, MD  Diclofenac-Misoprostol 75-0.2 MG TBEC Take 1 tablet by mouth daily.    Historical Provider, MD  ferrous sulfate 325 (65 FE) MG tablet Take 1 tablet (325 mg total) by mouth daily. 07/21/12 07/21/13  Charles B. Karle Starch, MD  fish oil-omega-3 fatty acids 1000 MG capsule Take 2 g by mouth daily.    Historical Provider, MD  gabapentin (NEURONTIN) 100 MG capsule Take 100 mg by mouth 3 (three) times daily.    Historical Provider, MD  glipiZIDE (GLUCOTROL XL) 10 MG 24 hr tablet Take 1 tablet (10 mg total) by mouth daily. 09/25/12   Threasa Beards, MD  levofloxacin (LEVAQUIN) 750 MG tablet  Take 1 tablet (750 mg total) by mouth daily. 09/25/12   Alfonzo Feller, DO  losartan (COZAAR) 25 MG tablet Take 1 tablet (25 mg total) by mouth daily. 09/25/12   Threasa Beards, MD  metFORMIN (GLUCOPHAGE) 1000 MG tablet Take 1 tablet (1,000 mg total) by mouth 2 (two) times daily. 07/21/12 07/21/13  Charles B. Karle Starch, MD  metFORMIN (GLUCOPHAGE) 1000 MG tablet Take 1 tablet (1,000 mg total) by mouth 2 (two) times daily. 09/25/12   Threasa Beards, MD  nitroGLYCERIN (NITROSTAT) 0.4 MG SL tablet Place 0.4 mg under the tongue every 5 (five) minutes as needed. For chest pain    Historical Provider, MD  simvastatin (ZOCOR) 80 MG tablet Take 1 tablet (80 mg total) by mouth daily. 09/25/12   Threasa Beards, MD   Physical Exam: Filed Vitals:   03/18/13 [id number] 03/18/13 0751 03/18/13 0916  BP:  165/85   Pulse:  78   Temp:  98.5 F (36.9 C) 98.5 F (36.9 C)  TempSrc:  Oral   Resp:  18   SpO2: 96% 96%     Physical Exam  Constitutional: Appears well-developed and well-nourished. No distress.  HENT: Normocephalic. External right and left ear normal. Oropharynx is clear and moist.  Eyes: Conjunctivae and EOM are normal. PERRLA, no scleral icterus.  Neck: Normal ROM. Neck supple. No JVD. No tracheal deviation. No thyromegaly.  CVS: RRR, S1/S2 +,  no murmurs, no gallops, no carotid bruit.  Pulmonary: Effort and breath sounds normal, no stridor, rhonchi, wheezes, rales.  Abdominal: Soft. BS +,  no distension, tenderness, rebound or guarding.  Musculoskeletal: Normal range of motion. No edema and no tenderness.  Lymphadenopathy: No lymphadenopathy noted, cervical, inguinal. Neuro: Alert. Normal reflexes, muscle tone coordination. No cranial nerve deficit. Skin: Skin is warm and dry. No rash noted. Not diaphoretic. No erythema. No pallor.  Psychiatric: Normal mood and affect. Behavior, judgment, thought content normal.   Labs on Admission:  Basic Metabolic Panel:  Recent Labs Lab 03/18/13 0845  NA 137   K 4.1  CL 102  CO2 22  GLUCOSE 448*  BUN 38*  CREATININE 1.51*  CALCIUM 9.7   Liver Function Tests:  Recent Labs Lab 03/18/13 0845  AST 11  ALT 9  ALKPHOS 115  BILITOT 0.2*  PROT 7.2  ALBUMIN 2.8*   No results found for this basename: LIPASE, AMYLASE,  in the last 168 hours No results found for this basename: AMMONIA,  in the last 168 hours CBC:  Recent Labs Lab 03/18/13 0845  WBC 11.6*  NEUTROABS 8.5*  HGB 10.4*  HCT 30.8*  MCV 83.9  PLT 243   Cardiac Enzymes:  Recent Labs Lab 03/18/13 0845  TROPONINI <0.30   BNP: No components found with this basename: POCBNP,  CBG:  Recent Labs Lab 03/18/13 0755 03/18/13 0927 03/18/13 1052  GLUCAP 426* 414* 392*    Radiological Exams on Admission: Ct Head Wo Contrast 03/18/2013  *RADIOLOGY REPORT*  Clinical Data: Headache.  Diabetic hypertensive patient with hyperlipidemia.  Hyperglycemia.  CT HEAD WITHOUT CONTRAST  Technique:  Contiguous axial images were obtained from the base of the skull through the vertex without contrast.  Comparison: None.  Findings: No intracranial hemorrhage.  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  Remote tiny mid right corona radiata infarct.  Small vessel disease type changes.  No CT evidence of large acute infarct.  No intracranial mass lesion detected on this unenhanced exam.  No hydrocephalus.  Vascular calcifications.  Mastoid air cells, middle ear cavities and visualized sinuses are clear.  IMPRESSION: No intracranial hemorrhage.  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  Remote tiny mid right corona radiata infarct.  Small vessel disease type changes.  No CT evidence of large acute infarct.     EKG: Normal sinus rhythm, no ST/T wave changes  Time spent, 75 minutes

## 2013-03-18 NOTE — Progress Notes (Signed)
SLP received order for speech, language evaluation to be initiated on 03/19/13.  Will complete as ordered 5/1.  Thanks. Luanna Salk, [facility] SLP [phone]

## 2013-03-18 NOTE — ED Notes (Signed)
[id number] Expected date:<BR> Expected time:<BR> Means of arrival:<BR> Comments:<BR> Lethargic/CBG 474

## 2013-03-18 NOTE — Progress Notes (Signed)
[facility] PROGRESS NOTE   Assessment/Plan: Nausea and vomiting Due Large PCA infarct: - MRI head 4.30.2014: Moderate to large confluent right PICA cerebellar infarct.  Early  edema. No associated hemorrhage or significant mass effect at this time. - Start stain and aspirin, Neurology consulted. - HbgA1c pending. - Carotid doppler,  ECHO, carotid doppler pending. - Transfer to Stepdown unit for closer monitoring.  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 Then ACHS. - Check a Mag and basic met in am.  Chronic kidney disease, stage 3 - Baseline Cr. 1.5, previous 1.3.  Leukocytosis: - Unlcear etiology. - urine culture send a febrile.  Anemia of chronic disease: - monitor Hbg.    Diabetic neuropathy: - Continue gabapentin.  Dyslipidemia  - Continue simvastatin   HYPERTENSION  - Continue home medications, losartan   Chronic systolic and diastolic CHF  - 2 D ECHO in 2011 with ejection fraction 55-60%  - Followup 2-D echo on this admission  Code Status: full Family Communication: Pt at bedside  Disposition Plan: Admit for further evaluation    Consultants:  neuro  Procedures:  MRI  Carotid  ECho   Antibiotics: none HPI/Subjective: Neurological intact. She relates she still has a headache but improved from previous  Objective: Filed Vitals:   03/18/13 1000 03/18/13 1030 03/18/13 1635 03/18/13 1647  BP: 191/84 152/69 172/80   Pulse:      Temp:   97.9 F (36.6 C)   TempSrc:      Resp: 20 23 22    Height:    5\' 3"  (1.6 m)  Weight:    109.907 kg (242 lb 4.8 oz)  SpO2:  96% 92%    No intake or output data in the 24 hours ending 03/18/13 1747 Filed Weights   03/18/13 1647  Weight: 109.907 kg (242 lb 4.8 oz)    Exam:  General: Alert,  awake, oriented x3, in no acute distress.  HEENT: No bruits, no goiter.  Heart: Regular rate and rhythm, without murmurs, rubs, gallops.  Lungs: Good air movement, clear to auscultation. Abdomen: Soft, nontender, nondistended, positive bowel sounds.  Neuro: Grossly intact, nonfocal.   Data Reviewed: Basic Metabolic Panel:  Recent Labs Lab 03/18/13 0845 03/18/13 1509  NA 137 141  K 4.1 4.2  CL 102 103  CO2 22 25  GLUCOSE 448* 416*  BUN 38* 36*  CREATININE 1.51* 1.46*  CALCIUM 9.7 9.7   Liver Function Tests:  Recent Labs Lab 03/18/13 0845  AST 11  ALT 9  ALKPHOS 115  BILITOT 0.2*  PROT 7.2  ALBUMIN 2.8*   No results found for this basename: LIPASE, AMYLASE,  in the last 168 hours No results found for this basename: AMMONIA,  in the last 168 hours CBC:  Recent Labs Lab 03/18/13 0845  WBC 11.6*  NEUTROABS 8.5*  HGB 10.4*  HCT 30.8*  MCV 83.9  PLT 243   Cardiac Enzymes:  Recent Labs Lab 03/18/13 0845  TROPONINI <0.30   BNP (last 3 results)  Recent Labs  09/25/12 1250  PROBNP 1010.0*   CBG:  Recent Labs Lab 03/18/13 1052 03/18/13 1236 03/18/13 1355 03/18/13 1535 03/18/13 1742  GLUCAP 392* 407* 423* 412* 278*    No results found for this or any previous visit (from  the past 240 hour(s)).   Studies: Ct Head Wo Contrast  03/18/2013  *RADIOLOGY REPORT*  Clinical Data: Headache.  Diabetic hypertensive patient with hyperlipidemia.  Hyperglycemia.  CT HEAD WITHOUT CONTRAST  Technique:  Contiguous axial images were obtained from the base of the skull through the vertex without contrast.  Comparison: None.  Findings: No intracranial hemorrhage.  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  Remote tiny mid right corona radiata infarct.  Small vessel disease type changes.  No CT evidence of large acute infarct.  No intracranial mass lesion detected on this unenhanced exam.  No hydrocephalus.  Vascular calcifications.  Mastoid air cells,  middle ear cavities and visualized sinuses are clear.  IMPRESSION: No intracranial hemorrhage.  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  Remote tiny mid right corona radiata infarct.  Small vessel disease type changes.  No CT evidence of large acute infarct.   Original Report Authenticated By: Genia Del, M.D.    Mr Angiogram Head Wo Contrast  03/18/2013  *RADIOLOGY REPORT*  Clinical Data: 62 year old female with dizziness weakness hyperglycemia unable to walk confusion and vomiting.  Possible stroke.  Comparison: Head CT without contrast 03/18/2013.  MRI HEAD WITHOUT CONTRAST  Technique:  Multiplanar, multiecho pulse sequences of the brain and surrounding structures were obtained according to standard protocol without intravenous contrast.  Findings: Confluent moderate to large area of restricted diffusion in the inferior right cerebellum (right PICA territory).  Early cytotoxic edema (series 7 image 7).  No significant posterior fossa mass effect at this time. No evidence of associated hemorrhage.  No left cerebellum or brainstem involvement.  There is right occipital lobe posterior superior encephalomalacia. Associated white matter gliosis.  However, on diffusion weighted imaging there are are some small curvilinear areas along the periphery of the encephalomalacia which appear mildly restricted (series 3 image 19).  Furthermore, there is confluent cerebral white matter T2 and FLAIR hyperintensity in both hemispheres, but a small area of subcortical white matter mildly restricted diffusion is suggested (series 3 image 21 and series 300 image 21).  This is in the posterior right MCA territory.  The distal right vertebral artery flow void cannot be identified. The distal left vertebral artery flow void is present.  Other Major intracranial vascular flow voids are preserved.  No ventriculomegaly. No acute intracranial hemorrhage identified. Negative pituitary, cervicomedullary junction and  visualized cervical spine.  Deep gray matter nuclei and brain stem signal within normal limits.  Grossly normal visualized internal auditory structures. Visualized orbit soft tissues are within normal limits. Trace bilateral mastoid fluid.  Paranasal sinuses are clear. Visualized bone marrow signal is within normal limits.  Negative scalp soft tissues.  IMPRESSION: 1.  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 Early cytotoxic edema.  No associated hemorrhage or significant mass effect at this time. 2.  Subacute on chronic appearing right PCA and right MCA white matter infarcts.  Underlying chronic right occipital infarct with encephalomalacia corresponding to the earlier CT finding.  3.  See MRA findings below.  MRA HEAD WITHOUT CONTRAST  Technique:  Angiographic images of the Circle of Willis were obtained using MRA technique without intravenous contrast.  Findings:  Absent flow signal in the distal right vertebral artery which appears to be nondominant.  Right PICA origin probably on series 4 image 46 and also without flow signal.  Diminutive right AICA origin probably with preserved flow (image 65).  Preserved antegrade flow signal in a dominant appearing distal left vertebral artery.  Left PICA is patent.  The vessel now supplies the basilar which is patent without stenosis but tortuous.  SCA origins are within normal limits.  Right PCA origin is normal. Fetal type left PCA origin.  Right posterior communicating artery is diminutive or absent.  Bilateral PCA branches are within normal limits.  Antegrade flow in both ICA siphons.  Mild ICA ectasia.  Ophthalmic and left posterior communicating artery origins are within normal limits.  No ICA stenosis.  Carotid termini are patent.  The left ACA A1 segment appears to be nondominant.  Alternatively, it might be stenotic.  Right ACA is within normal limits.  Anterior communicating artery within normal limits.  The right ACA A2 segment also appears  somewhat dominant.  Visualized ACA branches are within normal limits.  Both MCA origins are within normal limits.  Mild bilateral M1 segment irregularity without stenosis. Both MCA bifurcations are patent.  Visualized bilateral MCA branches are within normal limits.  IMPRESSION: 1.  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  The distal left vertebral artery likely was nondominant. 2.  Otherwise negative posterior circulation. 3.  Mild anterior circulation atherosclerosis.  Nondominant versus atherosclerotic left ACA A1 segment.  Study discussed by telephone with Dr. Dorie Rank on 03/18/2013 at 1327 hours.   Original Report Authenticated By: Roselyn Reef, M.D.    Mr Brain Wo Contrast  03/18/2013  *RADIOLOGY REPORT*  Clinical Data: 62 year old female with dizziness weakness hyperglycemia unable to walk confusion and vomiting.  Possible stroke.  Comparison: Head CT without contrast 03/18/2013.  MRI HEAD WITHOUT CONTRAST  Technique:  Multiplanar, multiecho pulse sequences of the brain and surrounding structures were obtained according to standard protocol without intravenous contrast.  Findings: Confluent moderate to large area of restricted diffusion in the inferior right cerebellum (right PICA territory).  Early cytotoxic edema (series 7 image 7).  No significant posterior fossa mass effect at this time. No evidence of associated hemorrhage.  No left cerebellum or brainstem involvement.  There is right occipital lobe posterior superior encephalomalacia. Associated white matter gliosis.  However, on diffusion weighted imaging there are are some small curvilinear areas along the periphery of the encephalomalacia which appear mildly restricted (series 3 image 19).  Furthermore, there is confluent cerebral white matter T2 and FLAIR hyperintensity in both hemispheres, but a small area of subcortical white matter mildly restricted diffusion is suggested (series 3 image 21 and series 300 image 21).  This is in  the posterior right MCA territory.  The distal right vertebral artery flow void cannot be identified. The distal left vertebral artery flow void is present.  Other Major intracranial vascular flow voids are preserved.  No ventriculomegaly. No acute intracranial hemorrhage identified. Negative pituitary, cervicomedullary junction and visualized cervical spine.  Deep gray matter nuclei and brain stem signal within normal limits.  Grossly normal visualized internal auditory structures. Visualized orbit soft tissues are within normal limits. Trace bilateral mastoid fluid.  Paranasal sinuses are clear. Visualized bone marrow signal is within normal limits.  Negative scalp soft tissues.  IMPRESSION: 1.  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 Early cytotoxic edema.  No associated hemorrhage or significant mass effect at this time. 2.  Subacute on chronic appearing right PCA and right MCA white matter infarcts.  Underlying chronic right occipital infarct with encephalomalacia corresponding to the earlier CT finding.  3.  See MRA findings below.  MRA HEAD WITHOUT CONTRAST  Technique:  Angiographic images of the Circle of Willis were obtained  using MRA technique without intravenous contrast.  Findings:  Absent flow signal in the distal right vertebral artery which appears to be nondominant.  Right PICA origin probably on series 4 image 46 and also without flow signal.  Diminutive right AICA origin probably with preserved flow (image 65).  Preserved antegrade flow signal in a dominant appearing distal left vertebral artery.  Left PICA is patent.  The vessel now supplies the basilar which is patent without stenosis but tortuous.  SCA origins are within normal limits.  Right PCA origin is normal. Fetal type left PCA origin.  Right posterior communicating artery is diminutive or absent.  Bilateral PCA branches are within normal limits.  Antegrade flow in both ICA siphons.  Mild ICA ectasia.  Ophthalmic and  left posterior communicating artery origins are within normal limits.  No ICA stenosis.  Carotid termini are patent.  The left ACA A1 segment appears to be nondominant.  Alternatively, it might be stenotic.  Right ACA is within normal limits.  Anterior communicating artery within normal limits.  The right ACA A2 segment also appears somewhat dominant.  Visualized ACA branches are within normal limits.  Both MCA origins are within normal limits.  Mild bilateral M1 segment irregularity without stenosis. Both MCA bifurcations are patent.  Visualized bilateral MCA branches are within normal limits.  IMPRESSION: 1.  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  The distal left vertebral artery likely was nondominant. 2.  Otherwise negative posterior circulation. 3.  Mild anterior circulation atherosclerosis.  Nondominant versus atherosclerotic left ACA A1 segment.  Study discussed by telephone with Dr. Dorie Rank on 03/18/2013 at 1327 hours.   Original Report Authenticated By: Roselyn Reef, M.D.     Scheduled Meds: . atorvastatin  40 mg Oral q1800  . carvedilol  6.25 mg Oral BID WC  . [START ON 03/19/2013] Diclofenac-Misoprostol  1 tablet Oral Daily  . ferrous sulfate  325 mg Oral Daily  . gabapentin  100 mg Oral TID  . losartan  25 mg Oral Daily  . omega-3 acid ethyl esters  1 g Oral Daily   Continuous Infusions: . sodium chloride 75 mL/hr at 03/18/13 1657  . dextrose 5 % and 0.45% NaCl Stopped (03/18/13 1657)  . insulin (NOVOLIN-R) infusion 2.7 Units/hr (03/18/13 1647)     Charlynne Cousins  [facility] Pager [phone]. If 8PM-8AM, please contact night-coverage at [email or web address], password [facility] 03/18/2013, 5:47 PM  LOS: 0 days

## 2013-03-18 NOTE — Consult Note (Signed)
Referring Physician: Dr. Charlies Silvers    Chief Complaint: Acute onset of dizziness and nausea as well as headache.  HPI: [name] is an 62 y.o. female history of hypertension, hyperlipidemia, diabetes mellitus, chronic kidney disease and systolic and diastolic heart failure, who was brought to [facility] emergency room following acute onset of headache dizziness and nausea. She describes the dizziness as turning to falling type sensation. There is no previous history of stroke nor TIA. She has not been on antiplatelet therapy. CT scan of her head was unremarkable. 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 Subacute on chronic appearing right PCA and right MCA white matter infarcts were also noted. 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 NIH stroke score was 0.  LSN: 6 AM on 03/18/2013 tPA Given: No: Beyond time under for treatment consideration and no objective deficits. MRankin: 1  Past Medical History  Diagnosis Date  . Hypertension   . Diabetes mellitus   . Hyperlipemia   . Cancer     endo ca    History reviewed. No pertinent family history.   Medications:  Prior to Admission:  Prescriptions prior to admission  Medication Sig Dispense Refill  . insulin glargine (LANTUS) 100 UNIT/ML injection Inject 30 Units into the skin at bedtime.        . carvedilol (COREG) 6.25 MG tablet Take 1 tablet (6.25 mg total) by mouth 2 (two) times daily with a meal.  60 tablet  7  . Cholecalciferol (VITAMIN D3 PO) Take 1 tablet by mouth once a week. Takes on Thursday      . Diclofenac-Misoprostol 75-0.2 MG TBEC Take 1 tablet by mouth daily.      . ferrous sulfate 325 (65 FE) MG tablet Take 1 tablet (325 mg total) by mouth daily.  30 tablet  0  . fish oil-omega-3 fatty acids 1000 MG capsule Take 2 g by mouth daily.      Marland Kitchen gabapentin (NEURONTIN) 100 MG capsule Take 100 mg by mouth 3 (three) times  daily.      Marland Kitchen glipiZIDE (GLUCOTROL XL) 10 MG 24 hr tablet Take 1 tablet (10 mg total) by mouth daily.  30 tablet  0  . levofloxacin (LEVAQUIN) 750 MG tablet Take 1 tablet (750 mg total) by mouth daily.  5 tablet  0  . losartan (COZAAR) 25 MG tablet Take 1 tablet (25 mg total) by mouth daily.  30 tablet  6  . metFORMIN (GLUCOPHAGE) 1000 MG tablet Take 1 tablet (1,000 mg total) by mouth 2 (two) times daily.  30 tablet  0  . metFORMIN (GLUCOPHAGE) 1000 MG tablet Take 1 tablet (1,000 mg total) by mouth 2 (two) times daily.  60 tablet  0  . nitroGLYCERIN (NITROSTAT) 0.4 MG SL tablet Place 0.4 mg under the tongue every 5 (five) minutes as needed. For chest pain      . simvastatin (ZOCOR) 80 MG tablet Take 1 tablet (80 mg total) by mouth daily.  30 tablet  0     Physical Examination: Blood pressure 172/80, pulse 78, temperature 97.9 F (36.6 C), temperature source Oral, resp. rate 22, height 5\' 3"  (1.6 m), weight 109.907 kg (242 lb 4.8 oz), SpO2 92.00%.  Neurologic Examination: Mental Status: Alert, oriented, thought content appropriate.  Speech fluent without evidence of aphasia. Able to follow commands without difficulty. Cranial Nerves: II-Visual fields were normal. III/IV/VI-Pupils were equal and reacted. Extraocular movements were  full and conjugate.    V/VII-no facial numbness and no facial weakness. VIII-normal. Anselmo Pickler was slightly slurred but consistent with being edentulous; symmetrical palatal movement. Motor: 5/5 bilaterally with normal tone and bulk Sensory: Normal throughout. Deep Tendon Reflexes: Trace to 1+ and symmetric. Plantars: Mute bilaterally Cerebellar: Normal finger-to-nose testing. Carotid auscultation: Normal  Ct Head Wo Contrast  03/18/2013  *RADIOLOGY REPORT*  Clinical Data: Headache.  Diabetic hypertensive patient with hyperlipidemia.  Hyperglycemia.  CT HEAD WITHOUT CONTRAST  Technique:  Contiguous axial images were obtained from the base of the skull through  the vertex without contrast.  Comparison: None.  Findings: No intracranial hemorrhage.  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  Remote tiny mid right corona radiata infarct.  Small vessel disease type changes.  No CT evidence of large acute infarct.  No intracranial mass lesion detected on this unenhanced exam.  No hydrocephalus.  Vascular calcifications.  Mastoid air cells, middle ear cavities and visualized sinuses are clear.  IMPRESSION: No intracranial hemorrhage.  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  Remote tiny mid right corona radiata infarct.  Small vessel disease type changes.  No CT evidence of large acute infarct.   Original Report Authenticated By: Genia Del, M.D.    Mr Angiogram Head Wo Contrast  03/18/2013  *RADIOLOGY REPORT*  Clinical Data: 62 year old female with dizziness weakness hyperglycemia unable to walk confusion and vomiting.  Possible stroke.  Comparison: Head CT without contrast 03/18/2013.  MRI HEAD WITHOUT CONTRAST  Technique:  Multiplanar, multiecho pulse sequences of the brain and surrounding structures were obtained according to standard protocol without intravenous contrast.  Findings: Confluent moderate to large area of restricted diffusion in the inferior right cerebellum (right PICA territory).  Early cytotoxic edema (series 7 image 7).  No significant posterior fossa mass effect at this time. No evidence of associated hemorrhage.  No left cerebellum or brainstem involvement.  There is right occipital lobe posterior superior encephalomalacia. Associated white matter gliosis.  However, on diffusion weighted imaging there are are some small curvilinear areas along the periphery of the encephalomalacia which appear mildly restricted (series 3 image 19).  Furthermore, there is confluent cerebral white matter T2 and FLAIR hyperintensity in both hemispheres, but a small area of subcortical white matter mildly restricted diffusion is  suggested (series 3 image 21 and series 300 image 21).  This is in the posterior right MCA territory.  The distal right vertebral artery flow void cannot be identified. The distal left vertebral artery flow void is present.  Other Major intracranial vascular flow voids are preserved.  No ventriculomegaly. No acute intracranial hemorrhage identified. Negative pituitary, cervicomedullary junction and visualized cervical spine.  Deep gray matter nuclei and brain stem signal within normal limits.  Grossly normal visualized internal auditory structures. Visualized orbit soft tissues are within normal limits. Trace bilateral mastoid fluid.  Paranasal sinuses are clear. Visualized bone marrow signal is within normal limits.  Negative scalp soft tissues.  IMPRESSION: 1.  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 Early cytotoxic edema.  No associated hemorrhage or significant mass effect at this time. 2.  Subacute on chronic appearing right PCA and right MCA white matter infarcts.  Underlying chronic right occipital infarct with encephalomalacia corresponding to the earlier CT finding.  3.  See MRA findings below.  MRA HEAD WITHOUT CONTRAST  Technique:  Angiographic images of the Circle of Willis were obtained using MRA technique without intravenous contrast.  Findings:  Absent flow signal in the  distal right vertebral artery which appears to be nondominant.  Right PICA origin probably on series 4 image 46 and also without flow signal.  Diminutive right AICA origin probably with preserved flow (image 65).  Preserved antegrade flow signal in a dominant appearing distal left vertebral artery.  Left PICA is patent.  The vessel now supplies the basilar which is patent without stenosis but tortuous.  SCA origins are within normal limits.  Right PCA origin is normal. Fetal type left PCA origin.  Right posterior communicating artery is diminutive or absent.  Bilateral PCA branches are within normal limits.   Antegrade flow in both ICA siphons.  Mild ICA ectasia.  Ophthalmic and left posterior communicating artery origins are within normal limits.  No ICA stenosis.  Carotid termini are patent.  The left ACA A1 segment appears to be nondominant.  Alternatively, it might be stenotic.  Right ACA is within normal limits.  Anterior communicating artery within normal limits.  The right ACA A2 segment also appears somewhat dominant.  Visualized ACA branches are within normal limits.  Both MCA origins are within normal limits.  Mild bilateral M1 segment irregularity without stenosis. Both MCA bifurcations are patent.  Visualized bilateral MCA branches are within normal limits.  IMPRESSION: 1.  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  The distal left vertebral artery likely was nondominant. 2.  Otherwise negative posterior circulation. 3.  Mild anterior circulation atherosclerosis.  Nondominant versus atherosclerotic left ACA A1 segment.  Study discussed by telephone with Dr. Dorie Rank on 03/18/2013 at 1327 hours.   Original Report Authenticated By: Roselyn Reef, M.D.    Mr Brain Wo Contrast  03/18/2013  *RADIOLOGY REPORT*  Clinical Data: 62 year old female with dizziness weakness hyperglycemia unable to walk confusion and vomiting.  Possible stroke.  Comparison: Head CT without contrast 03/18/2013.  MRI HEAD WITHOUT CONTRAST  Technique:  Multiplanar, multiecho pulse sequences of the brain and surrounding structures were obtained according to standard protocol without intravenous contrast.  Findings: Confluent moderate to large area of restricted diffusion in the inferior right cerebellum (right PICA territory).  Early cytotoxic edema (series 7 image 7).  No significant posterior fossa mass effect at this time. No evidence of associated hemorrhage.  No left cerebellum or brainstem involvement.  There is right occipital lobe posterior superior encephalomalacia. Associated white matter gliosis.  However, on diffusion  weighted imaging there are are some small curvilinear areas along the periphery of the encephalomalacia which appear mildly restricted (series 3 image 19).  Furthermore, there is confluent cerebral white matter T2 and FLAIR hyperintensity in both hemispheres, but a small area of subcortical white matter mildly restricted diffusion is suggested (series 3 image 21 and series 300 image 21).  This is in the posterior right MCA territory.  The distal right vertebral artery flow void cannot be identified. The distal left vertebral artery flow void is present.  Other Major intracranial vascular flow voids are preserved.  No ventriculomegaly. No acute intracranial hemorrhage identified. Negative pituitary, cervicomedullary junction and visualized cervical spine.  Deep gray matter nuclei and brain stem signal within normal limits.  Grossly normal visualized internal auditory structures. Visualized orbit soft tissues are within normal limits. Trace bilateral mastoid fluid.  Paranasal sinuses are clear. Visualized bone marrow signal is within normal limits.  Negative scalp soft tissues.  IMPRESSION: 1.  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 Early cytotoxic edema.  No associated hemorrhage or significant mass effect at this time. 2.  Subacute  on chronic appearing right PCA and right MCA white matter infarcts.  Underlying chronic right occipital infarct with encephalomalacia corresponding to the earlier CT finding.  3.  See MRA findings below.  MRA HEAD WITHOUT CONTRAST  Technique:  Angiographic images of the Circle of Willis were obtained using MRA technique without intravenous contrast.  Findings:  Absent flow signal in the distal right vertebral artery which appears to be nondominant.  Right PICA origin probably on series 4 image 46 and also without flow signal.  Diminutive right AICA origin probably with preserved flow (image 65).  Preserved antegrade flow signal in a dominant appearing distal left  vertebral artery.  Left PICA is patent.  The vessel now supplies the basilar which is patent without stenosis but tortuous.  SCA origins are within normal limits.  Right PCA origin is normal. Fetal type left PCA origin.  Right posterior communicating artery is diminutive or absent.  Bilateral PCA branches are within normal limits.  Antegrade flow in both ICA siphons.  Mild ICA ectasia.  Ophthalmic and left posterior communicating artery origins are within normal limits.  No ICA stenosis.  Carotid termini are patent.  The left ACA A1 segment appears to be nondominant.  Alternatively, it might be stenotic.  Right ACA is within normal limits.  Anterior communicating artery within normal limits.  The right ACA A2 segment also appears somewhat dominant.  Visualized ACA branches are within normal limits.  Both MCA origins are within normal limits.  Mild bilateral M1 segment irregularity without stenosis. Both MCA bifurcations are patent.  Visualized bilateral MCA branches are within normal limits.  IMPRESSION: 1.  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  The distal left vertebral artery likely was nondominant. 2.  Otherwise negative posterior circulation. 3.  Mild anterior circulation atherosclerosis.  Nondominant versus atherosclerotic left ACA A1 segment.  Study discussed by telephone with Dr. Dorie Rank on 03/18/2013 at 1327 hours.   Original Report Authenticated By: Roselyn Reef, M.D.     Assessment: 62 y.o. 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  Stroke Risk Factors - diabetes mellitus, hyperlipidemia and hypertension  Plan: 1. HgbA1c, fasting lipid panel 2. PT consult, OT consult, Speech consult 3. Echocardiogram 4. Carotid dopplers 5. Prophylactic therapy-Antiplatelet med: Aspirin milligrams per day 6. Risk factor modification 7. Telemetry monitoring in ICU setting overnight, given her early  signs of cytotoxic edema associated with a cerebellar stroke, and uncontrolled diabetes.   C.R. Nicole Kindred, MD [facility] [phone]  03/18/2013, 5:45 PM

## 2013-03-19 ENCOUNTER — Inpatient Hospital Stay (HOSPITAL_COMMUNITY): Payer: Medicare Other

## 2013-03-19 DIAGNOSIS — I6350 Cerebral infarction due to unspecified occlusion or stenosis of unspecified cerebral artery: Principal | ICD-10-CM

## 2013-03-19 DIAGNOSIS — I10 Essential (primary) hypertension: Secondary | ICD-10-CM

## 2013-03-19 DIAGNOSIS — R7309 Other abnormal glucose: Secondary | ICD-10-CM

## 2013-03-19 DIAGNOSIS — I6330 Cerebral infarction due to thrombosis of unspecified cerebral artery: Secondary | ICD-10-CM

## 2013-03-19 LAB — URINE CULTURE
Colony Count: NO GROWTH
Culture: NO GROWTH

## 2013-03-19 LAB — COMPREHENSIVE METABOLIC PANEL
BUN: 33 mg/dL — ABNORMAL HIGH (ref 6–23)
CO2: 26 mEq/L (ref 19–32)
Calcium: 9.4 mg/dL (ref 8.4–10.5)
Chloride: 106 mEq/L (ref 96–112)
Creatinine, Ser: 1.68 mg/dL — ABNORMAL HIGH (ref 0.50–1.10)
GFR calc Af Amer: 37 mL/min — ABNORMAL LOW (ref >90)
GFR calc non Af Amer: 32 mL/min — ABNORMAL LOW (ref >90)
Glucose, Bld: 322 mg/dL — ABNORMAL HIGH (ref 70–99)
Total Bilirubin: 0.3 mg/dL (ref 0.3–1.2)

## 2013-03-19 LAB — GLUCOSE, CAPILLARY
Glucose-Capillary: 249 mg/dL — ABNORMAL HIGH (ref 70–99)
Glucose-Capillary: 317 mg/dL — ABNORMAL HIGH (ref 70–99)
Glucose-Capillary: 238 mg/dL — ABNORMAL HIGH (ref 70–99)

## 2013-03-19 LAB — CBC
Hemoglobin: 10.3 g/dL — ABNORMAL LOW (ref 12.0–15.0)
Platelets: 228 10*3/uL (ref 150–400)
RBC: 3.59 MIL/uL — ABNORMAL LOW (ref 3.87–5.11)

## 2013-03-19 LAB — MAGNESIUM: Magnesium: 2 mg/dL (ref 1.5–2.5)

## 2013-03-19 LAB — LIPID PANEL
HDL: 36 mg/dL — ABNORMAL LOW (ref >39)
LDL Cholesterol: 95 mg/dL (ref 0–99)
Total CHOL/HDL Ratio: 5.2 RATIO
VLDL: 56 mg/dL — ABNORMAL HIGH (ref 0–40)

## 2013-03-19 LAB — HEMOGLOBIN A1C
Hgb A1c MFr Bld: 10.8 % — ABNORMAL HIGH (ref <5.7)
Mean Plasma Glucose: 263 mg/dL — ABNORMAL HIGH (ref <117)

## 2013-03-19 MED ORDER — INSULIN GLARGINE 100 UNIT/ML ~~LOC~~ SOLN
36.0000 [IU] | Freq: Every day | SUBCUTANEOUS | Status: DC
  Administered 2013-03-20 – 2013-03-21 (×2): 36 [IU] via SUBCUTANEOUS
  Filled 2013-03-19 (×3): qty 0.36

## 2013-03-19 MED ORDER — ASPIRIN EC 325 MG PO TBEC
325.0000 mg | DELAYED_RELEASE_TABLET | Freq: Every day | ORAL | Status: DC
  Administered 2013-03-19 – 2013-03-23 (×5): 325 mg via ORAL
  Filled 2013-03-19 (×5): qty 1

## 2013-03-19 MED ORDER — DICLOFENAC SODIUM 75 MG PO TBEC
75.0000 mg | DELAYED_RELEASE_TABLET | Freq: Every day | ORAL | Status: DC
  Administered 2013-03-19 – 2013-03-23 (×5): 75 mg via ORAL
  Filled 2013-03-19 (×5): qty 1

## 2013-03-19 MED ORDER — MISOPROSTOL 200 MCG PO TABS
200.0000 ug | ORAL_TABLET | Freq: Every day | ORAL | Status: DC
  Administered 2013-03-19 – 2013-03-23 (×5): 200 ug via ORAL
  Filled 2013-03-19 (×5): qty 1

## 2013-03-19 NOTE — Progress Notes (Signed)
Stroke Team Progress Note  HISTORY [name] is an 62 y.o. female history of hypertension, hyperlipidemia, diabetes mellitus, chronic kidney disease and systolic and diastolic heart failure, who was brought to [facility] emergency room following acute onset of headache dizziness and nausea. She describes the dizziness as turning to falling type sensation. There is no previous history of stroke nor TIA. She has not been on antiplatelet therapy. CT scan of her head was unremarkable. 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 Subacute on chronic appearing right PCA and right MCA white matter infarcts were also noted. 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 NIH stroke score was 0   Patient was not a TPA candidate secondary to beyond treatment teim. She was admitted to the neuro floor for further evaluation and treatment.  SUBJECTIVE  Lying in bed,  able to follow commands. No compaints    OBJECTIVE Most recent Vital Signs: Filed Vitals:   03/19/13 0328 03/19/13 0400 03/19/13 0417 03/19/13 0500  BP: 172/64  164/60   Pulse: 88 86 88   Temp: 99.9 F (37.7 C)     TempSrc: Oral     Resp: 30 30 18    Height:      Weight:    110.2 kg (242 lb 15.2 oz)  SpO2: 96% 94% 95%    CBG (last 3)   Recent Labs  03/18/13 1742 03/18/13 1828 03/18/13 2146  GLUCAP 278* 279* 249*    IV Fluid Intake:   . sodium chloride 75 mL/hr at 03/19/13 0600    MEDICATIONS  . atorvastatin  40 mg Oral q1800  . carvedilol  6.25 mg Oral BID WC  . Diclofenac-Misoprostol  1 tablet Oral Daily  . ferrous sulfate  325 mg Oral Q breakfast  . gabapentin  100 mg Oral TID  . insulin aspart  0-20 Units Subcutaneous TID WC  . insulin aspart  0-5 Units Subcutaneous QHS  . insulin glargine  30 Units Subcutaneous QHS  . losartan  25 mg Oral Daily  . omega-3 acid ethyl esters  1 g Oral Daily   PRN:   acetaminophen, acetaminophen, dextrose, HYDROcodone-acetaminophen, morphine injection, ondansetron (ZOFRAN) IV, ondansetron  Diet:  Carb Control thin liquids Activity: Up with assistance DVT Prophylaxis:  SCD  CLINICALLY SIGNIFICANT STUDIES Basic Metabolic Panel:  Recent Labs Lab 03/18/13 1509 03/19/13 0420  NA 141 141  K 4.2 4.7  CL 103 106  CO2 25 26  GLUCOSE 416* 322*  BUN 36* 33*  CREATININE 1.46* 1.68*  CALCIUM 9.7 9.4  MG  --  2.0   Liver Function Tests:  Recent Labs Lab 03/18/13 0845 03/19/13 0420  AST 11 10  ALT 9 7  ALKPHOS 115 105  BILITOT 0.2* 0.3  PROT 7.2 7.2  ALBUMIN 2.8* 2.8*   CBC:  Recent Labs Lab 03/18/13 0845 03/19/13 0420  WBC 11.6* 8.2  NEUTROABS 8.5*  --   HGB 10.4* 10.3*  HCT 30.8* 30.1*  MCV 83.9 83.8  PLT 243 228   Coagulation:  Recent Labs Lab 03/18/13 0845  LABPROT 12.6  INR 0.95   Cardiac Enzymes:  Recent Labs Lab 03/18/13 0845  TROPONINI <0.30   Urinalysis:  Recent Labs Lab 03/18/13 0800 03/18/13 2127  COLORURINE YELLOW YELLOW  LABSPEC 1.020 1.021  PHURINE 6.0 5.0  GLUCOSEU >1000* >1000*  HGBUR TRACE* MODERATE*  BILIRUBINUR NEGATIVE NEGATIVE  KETONESUR NEGATIVE NEGATIVE  PROTEINUR 100* >300*  UROBILINOGEN  0.2 0.2  NITRITE NEGATIVE NEGATIVE  LEUKOCYTESUR NEGATIVE NEGATIVE   Lipid Panel    Component Value Date/Time   CHOL 187 03/19/2013 0420   TRIG 282* 03/19/2013 0420   HDL 36* 03/19/2013 0420   CHOLHDL 5.2 03/19/2013 0420   VLDL 56* 03/19/2013 0420   LDLCALC 95 03/19/2013 0420   HgbA1C  Lab Results  Component Value Date   HGBA1C  Value: 13.8 (NOTE)                                                                       According to the ADA Clinical Practice Recommendations for 2011, when HbA1c is used as a screening test:   >=6.5%   Diagnostic of Diabetes Mellitus           (if abnormal result  is confirmed)  5.7-6.4%   Increased risk of developing Diabetes Mellitus  References:Diagnosis and Classification of  Diabetes Mellitus,Diabetes [id number] 1):S62-S69 and Standards of Medical Care in         Diabetes - 2011,Diabetes AM:[id number]  (Suppl 1):S11-S61.* 09/02/2010    Urine Drug Screen:   No results found for this basename: labopia, cocainscrnur, labbenz, amphetmu, thcu, labbarb    Alcohol Level: No results found for this basename: ETH,  in the last 168 hours  Dg Chest 2 View 03/19/2013  Cardiac enlargement with pulmonary vascular congestion.      Ct Head Wo Contrast 03/18/2013 No intracranial hemorrhage.  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  Remote tiny mid right corona radiata infarct.  Small vessel disease type changes.  No CT evidence of large acute infarct.     Mr Angiogram Head Wo Contrast 03/18/2013: 1.  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 Early cytotoxic edema.  No associated hemorrhage or significant mass effect at this time. 2.  Subacute on chronic appearing right PCA and right MCA white matter infarcts.  Underlying chronic right occipital infarct with encephalomalacia corresponding to the earlier CT finding.     MRA HEAD WITHOUT CONTRAST  03/18/2013  1.  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  The distal left vertebral artery likely was nondominant. 2.  Otherwise negative posterior circulation. 3.  Mild anterior circulation atherosclerosis.  Nondominant versus atherosclerotic left ACA A1 segment.  Study discussed by telephone with Dr. Dorie Rank on 03/18/2013 at 1327 hours.     2D Echocardiogram Left ventricle: The cavity size was normal. Wall thickness was increased in a pattern of mild LVH. There was focal basal hypertrophy. Systolic function was vigorous. The estimated ejection fraction was in the range of 65% to 70%. Wall motion was normal; there were no regional wall motion abnormalities.- Left atrium: The atrium was mildly dilated.- Right atrium: The atrium was mildly dilated  Carotid Doppler  Bilaterally <40% internal  carotid artery stenosis  CXR  Cardiac enlargement with pulmonary vascular congestion.  EKG  normal sinus rhythm.   Therapy Recommendations pending  Physical Exam    Neurologic Examination:  Mental Status:  Sleepy but arouses easily, thought content appropriate. Speech fluent without evidence of aphasia. Able to follow commands without difficulty.  Cranial Nerves:  II-Visual fields were normal.  III/IV/VI-Pupils were equal and reacted. Extraocular movements were full and conjugate. No  nystagmus V/VII-no facial numbness and no facial weakness.  VIII-normal.  Anselmo Pickler was slightly slurred but consistent with being edentulous; symmetrical palatal movement.  Motor: 5/5 bilaterally with normal tone and bulk  Sensory: Normal throughout.  Deep Tendon Reflexes: Trace to 1+ and symmetric.  Plantars: Mute bilaterally  Cerebellar: Normal finger-to-nose testing.  Carotid auscultation: Normal   ASSESSMENT [name] is a 62 y.o. female presenting with headache, dizziness and nausea.  Imaging confirms a right PICA cerebellar infarct with cytotoxic edema Infarct felt to be thrombotic.  On no anticoagulants prior to admission. Now on aspirin 325 mg orally every day for secondary stroke prevention. Patient with resultant headache and vertigo. Work up underway.   Diabetes mellitus with neuropathy, uncontrolled, type 2  Hyperlipidemia  Hypertension  Chronic kidney disease,-stage 3  Anemia of chronic disease  hypertension  Hospital day # 1  TREATMENT/PLAN   Continue aspirin 325 mg orally every day for secondary stroke prevention.  Goal LDL < 70 in diabetes, was on pravachol 80mg  PTA, rec: switch to Lipitor 40mg  daily  Aggressive diabetes control, goal Hgb A1C < 6.5  Risk factor modification  Await therapy evaluations.  CT head in am  Regenia Skeeter. Owens Shark, [facility], [place], [facility] Pager: [phone] 03/19/2013 7:44 AM  I have personally obtained a history,  examined the patient, evaluated imaging results, and formulated the assessment and plan of care. I agree with the above.  Antony Contras, MD

## 2013-03-19 NOTE — Evaluation (Signed)
Speech Language Pathology Evaluation Patient Details Name: [name] MRN: [id number] DOB: Apr 01, 1951 Today's Date: 03/19/2013 Time: 0910-0930 SLP Time Calculation (min): 20 min  Problem List:  Patient Active Problem List   Diagnosis Date Noted  . Headache 03/18/2013  . Nausea and vomiting 03/18/2013  . Chronic kidney disease, stage 3 03/18/2013  . Leukocytosis 03/18/2013  . Anemia of chronic disease 03/18/2013  . CVA (cerebral infarction) 03/18/2013  . Non-ketotic hyperosmolar coma 03/18/2013  . DM 10/18/2010  . HYPERTENSION 10/18/2010   Past Medical History:  Past Medical History  Diagnosis Date  . Hypertension   . Diabetes mellitus   . Hyperlipemia   . Cancer     endo ca   Past Surgical History:  Past Surgical History  Procedure Laterality Date  . Abdominal hysterectomy    . Leg tendon surgery      patient fell in a store right leg  . Leg surgery      hole in bone( during Childhood)   HPI:  [name] is an 62 y.o. female history of hypertension, hyperlipidemia, diabetes mellitus, chronic kidney disease and systolic and diastolic heart failure, who was brought to [facility] emergency room following acute onset of headache dizziness and nausea. She describes the dizziness as turning to falling type sensation. There is no previous history of stroke nor TIA. She has not been on antiplatelet therapy. CT scan of her head was unremarkable. 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 Subacute on chronic appearing right PCA and right MCA white matter infarcts were also noted. 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 NIH stroke score was 0.   Assessment / Plan / Recommendation Clinical Impression  Pt demonstrates mild cognitive deficits associated with distraction from discomfort/pain. Pt requires repetition of some complex tasks and new information for memory. She does  however independently utilize problem solving strategies to compensate including asking for repetition, taking extra time to process, writing down information for memory and making written notes. The pt demonstrates adequate use of language. Her affect is mildly flat but improves with cues. Again suspect nausea and pain are primary impairment. No skilled SLP intervention necessary at this time. Will sign off, please reorder if concerning deficits are noticed.     SLP Assessment  Patient does not need any further Speech Lanaguage Pathology Services    Follow Up Recommendations       Frequency and Duration        Pertinent Vitals/Pain NA   SLP Goals     SLP Evaluation Prior Functioning  Cognitive/Linguistic Baseline: Within functional limits Lives With:  (niece) Available Help at Discharge: Family Vocation: Retired   Associate Professor  Overall Cognitive Status: Within Functional Limits for tasks assessed Arousal/Alertness: Awake/alert Orientation Level: Oriented X4 Attention: Sustained;Focused;Selective Focused Attention: Appears intact Sustained Attention: Appears intact Selective Attention: Impaired Selective Attention Impairment: Verbal complex;Functional complex (needs repetition, distracted by pain) Memory: Impaired Memory Impairment: Retrieval deficit (independently uses comepensatory strategies. ) Awareness: Appears intact Problem Solving: Appears intact Executive Function: Reasoning;Self Monitoring;Self Correcting;Sequencing Reasoning: Appears intact Sequencing: Appears intact Self Monitoring: Appears intact Self Correcting: Appears intact Safety/Judgment: Appears intact    Comprehension  Auditory Comprehension Overall Auditory Comprehension: Appears within functional limits for tasks assessed Reading Comprehension Reading Status: Within funtional limits    Expression Expression Primary Mode of Expression: Verbal Verbal Expression Overall Verbal Expression: Appears  within functional limits for tasks assessed Written Expression Written  Expression: Within Functional Limits   Oral / Motor Oral Motor/Sensory Function Overall Oral Motor/Sensory Function: Appears within functional limits for tasks assessed Motor Speech Overall Motor Speech: Appears within functional limits for tasks assessed   GO    Herbie Baltimore, MA CCC-SLP [id number]  Lynann Beaver 03/19/2013, 9:38 AM

## 2013-03-19 NOTE — Evaluation (Signed)
Physical Therapy Evaluation Patient Details Name: [name] MRN: [id number] DOB: [birth year] Today's Date: 03/19/2013 Time: [id number] PT Time Calculation (min): 26 min  PT Assessment / Plan / Recommendation Clinical Impression  Patient is a 62 y/o female admitted with positive right PICA infarct with older right mCA and PCA infarcts and distal occlusion of left vertebral artery.  She presents with severe acitvity intolerance due to nausea and headache pain and imbalance with transfer OOB to chair.  She will benefit from skilled PT in the acute setting to maximize independence prior to d/c home.  May need postacute rehab prior to d.c home depending on progress.  Does have a neice who stays home during the day, but unknown if she can provide assist.  Will further assess d/c needs when patient more alert and participative.    PT Assessment  Patient needs continued PT services    Follow Up Recommendations  Home health PT;Supervision/Assistance - 24 hour;SNF;CIR (TBA further)       Barriers to Discharge None      Equipment Recommendations  Other (comment) (TBA)       Frequency Min 4X/week    Precautions / Restrictions Precautions Precautions: Fall   Pertinent Vitals/Pain 5/10 headache      Mobility  Bed Mobility Bed Mobility: Supine to Sit;Sitting - Scoot to Edge of Bed Supine to Sit: 4: Min assist;HOB elevated Sitting - Scoot to [employer] of Bed: 4: Min assist Details for Bed Mobility Assistance: Pt using Rt UE to pull against rail into sitting position.  Transfers Transfers: Stand Pivot Transfers Sit to Stand: 1: +2 Total assist;With upper extremity assist;From bed Sit to Stand: Patient Percentage: 80% Stand to Sit: 1: +2 Total assist;With upper extremity assist;To chair/3-in-1 Stand Pivot Transfers: 3: Mod assist Details for Transfer Assistance: uncontrolled descend to chair and buckle. Pt with MOd (A) for LOB and unable to correct. Pt unable to verbalize what happen during  the event        PT Diagnosis: Abnormality of gait  PT Problem List: Decreased activity tolerance;Decreased balance;Decreased mobility;Pain;Decreased safety awareness PT Treatment Interventions: DME instruction;Balance training;Gait training;Neuromuscular re-education;Stair training;Cognitive remediation;Functional mobility training;Patient/family education;Therapeutic activities;Therapeutic exercise   PT Goals Acute Rehab PT Goals PT Goal Formulation: With patient Time For Goal Achievement: 04/02/13 Potential to Achieve Goals: Good Pt will go Supine/Side to Sit: with rail;with supervision PT Goal: Supine/Side to Sit - Progress: Goal set today Pt will go Sit to Stand: with supervision PT Goal: Sit to Stand - Progress: Goal set today Pt will Transfer Bed to Chair/Chair to Bed: with supervision PT Transfer Goal: Bed to Chair/Chair to Bed - Progress: Goal set today Pt will Stand: with supervision;with unilateral upper extremity support;3 - 5 min PT Goal: Stand - Progress: Goal set today Pt will Ambulate: >150 feet;with least restrictive assistive device;with supervision PT Goal: Ambulate - Progress: Goal set today Pt will Go Up / Down Stairs: Flight;with rail(s);with supervision PT Goal: Up/Down Stairs - Progress: Goal set today  Visit Information  Last PT Received On: 03/19/13 Assistance Needed: +2 (initially for safety) PT/OT Co-Evaluation/Treatment: Yes    Subjective Data  Subjective: I just threw up. Patient Stated Goal: None stated   Prior Functioning  Home Living Lives With: Family (daughter and neice) Available Help at Discharge: Family;Available 24 hours/day Type of Home: House Home Access: Stairs to enter [employer] of Steps: 2  at side entrance  Entrance Stairs-Rails: None Home Layout: Two level;Bed/bath upstairs Alternate Level Stairs-Number of Steps: 13 Alternate Level  Stairs-Rails: Left Bathroom Shower/Tub: Tub/shower unit;Curtain Research scientist (life sciences): None Prior Function Level of Independence: Independent Able to Take Stairs?: Yes Driving: No Communication Communication: No difficulties Dominant Hand: Right    Cognition  Cognition Arousal/Alertness: Lethargic Behavior During Therapy: Flat affect Overall Cognitive Status: Difficult to assess    Extremity/Trunk Assessment Right Upper Extremity Assessment RUE ROM/Strength/Tone: Due to impaired cognition;Unable to fully assess Left Upper Extremity Assessment LUE ROM/Strength/Tone: Unable to fully assess;Due to impaired cognition Right Lower Extremity Assessment RLE ROM/Strength/Tone: Unable to fully assess;Due to impaired cognition (and lethargy) Left Lower Extremity Assessment LLE ROM/Strength/Tone: Unable to fully assess;Due to impaired cognition (and lethargy) Trunk Assessment Trunk Assessment: Normal   Balance Balance Balance Assessed: Yes Static Sitting Balance Static Sitting - Balance Support: Feet unsupported Static Sitting - Level of Assistance: 4: Min assist Static Sitting - Comment/# of Minutes: sat about 1-2 minutes prior to transfer to chair; eyes closed with c/o headache and nausea  End of Session PT - End of Session Activity Tolerance: Patient limited by pain;Other (comment) (nausea and fatigue from nausea medication) Patient left: in chair;with call bell/phone within reach  GP     [facility] 03/19/2013, 3:34 PM [place], [place] 03/19/2013

## 2013-03-19 NOTE — Progress Notes (Signed)
5/1  Attempted to speak with patient about her diabetes.  Had not been feeling well prior to this time.  Was able to find out a little history.  She has had diabetes since 1992, has been on insulin for about 1 year.  Sees Dr. Marvel Plan for her diabetes.  States that she is able to get her insulin.  Will continue to follow while in hospital.  Continue home dosage of Lantus and Novolog RESISTANT correction scale AC & HS at this time. Harvel Ricks RN BSN CDE

## 2013-03-19 NOTE — Progress Notes (Addendum)
[facility] Progress Note [place] TEAM 1 - Stepdown/ICU TEAM   [name] [id number] DOB: Dec 14, 1950 DOA: 03/18/2013 PCP: Dr Marvel Plan ( pt does not know first name)   HPI- 4/30: Patient is a 62 year old female with past medical history significant for chronic kidney disease, chronic systolic and diastolic heart failure (2-D echo in 2011 with ejection fraction 55-60%), diabetes, hypertension who presented to [facility] ED 03/18/2013 with complaints of nausea, vomiting and dizziness started around 6:00 this morning prior to this admission. Patient reported associated headache, 7/10 in intensity, frontal, nonradiating. No blurry vision or double vision. No loss of consciousness. Patient had no complaints of slurred speech, weakness. No complaints of chest pain, shortness of breath or palpitations. No fever or chills. No complaints of abdominal pain, no diarrhea or constipation.  In ED, evaluation included CT head which was significant for remote appearing right parietal - occipital lobe infarct but no acute intracranial findings. MRI of the brain was significant for moderate to large confluent right PICA cerebellar infarct and early cytotoxic edema. In addition, her CBGs were elevated 400 range. BMET revealed slightly elevated at 13. Patient was started on insulin drip on admission. Her creatinine was elevated at 1.51 which is slightly above her baseline of 1.3.   Assessment/Plan: Nausea and vomiting Due Large PCA infarct:  - MRI head 4.30.2014: Moderate to large confluent right PICA cerebellar infarct.  Early edema. No associated hemorrhage or significant mass effect at this time.  - cont stain and aspirin - LDL still above goal despite using high dose statin at home - HbgA1c reveals that sugars have been uncontrolled - Carotid doppler and ECHO unrevealing as far a source for CVA  - follow in Stepdown unit for 24 more hrs for closer monitoring.   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   Chronic kidney disease, stage 3  - Cr rising  - cont IVF - Baseline Cr. 1.5, previous 1.3.   Leukocytosis:  - Unlcear etiology- possible stress margination - urine culture sent- afebrile.   Anemia of chronic disease:  - monitor Hbg.   Diabetic neuropathy:  - Continue gabapentin.   Dyslipidemia  - Continue simvastatin   HYPERTENSION  - Continue home medications, losartan   Chronic systolic and diastolic CHF  - 2 D ECHO in 2011 with ejection fraction 55-60%  - 2-D echo 4/30:  Study Conclusions  - Left ventricle: The cavity size was normal. Wall thickness was increased in a pattern of mild LVH. There was focal basal hypertrophy. Systolic function was vigorous. The estimated ejection fraction was in the range of 65% to 70%. Wall motion was normal; there were no regional wall motion abnormalities. - Left atrium: The atrium was mildly dilated. - Right atrium: The atrium was mildly dilated.     Code Status: full Family Communication: none Disposition Plan: follow in SDU for 24 more hrs  Consultants: neuro  Procedures: none  Antibiotics: none  DVT prophylaxis: SCDs and ASA  HPI/Subjective: Pt awake- tells me her sugars were very uncontrolled at home- no complaints. No blurred vision, numbness or weakness.    Objective: Blood pressure 164/76, pulse 88, temperature 98.5 F (36.9 C), temperature source Oral, resp. rate 18, height 5\' 3"  (1.6 m), weight 110.2 kg (  242 lb 15.2 oz), SpO2 95.00%.  Intake/Output Summary (Last 24 hours) at 03/19/13 1447 Last data filed at 03/19/13 1100  Gross per 24 hour  Intake   1230 ml  Output   1475 ml  Net   -245 ml     Exam: General: No acute respiratory  distress Lungs: Clear to auscultation bilaterally without wheezes or crackles Cardiovascular: Regular rate and rhythm without murmur gallop or rub normal S1 and S2 Abdomen: Nontender, nondistended, soft, bowel sounds positive, no rebound, no ascites, no appreciable mass Extremities: No significant cyanosis, clubbing, or edema bilateral lower extremities  Data Reviewed: Basic Metabolic Panel:  Recent Labs Lab 03/18/13 0845 03/18/13 1509 03/19/13 0420  NA 137 141 141  K 4.1 4.2 4.7  CL 102 103 106  CO2 22 25 26   GLUCOSE 448* 416* 322*  BUN 38* 36* 33*  CREATININE 1.51* 1.46* 1.68*  CALCIUM 9.7 9.7 9.4  MG  --   --  2.0   Liver Function Tests:  Recent Labs Lab 03/18/13 0845 03/19/13 0420  AST 11 10  ALT 9 7  ALKPHOS 115 105  BILITOT 0.2* 0.3  PROT 7.2 7.2  ALBUMIN 2.8* 2.8*   No results found for this basename: LIPASE, AMYLASE,  in the last 168 hours No results found for this basename: AMMONIA,  in the last 168 hours CBC:  Recent Labs Lab 03/18/13 0845 03/19/13 0420  WBC 11.6* 8.2  NEUTROABS 8.5*  --   HGB 10.4* 10.3*  HCT 30.8* 30.1*  MCV 83.9 83.8  PLT 243 228   Cardiac Enzymes:  Recent Labs Lab 03/18/13 0845  TROPONINI <0.30   BNP (last 3 results)  Recent Labs  09/25/12 1250  PROBNP 1010.0*   CBG:  Recent Labs Lab 03/18/13 1742 03/18/13 1828 03/18/13 2146 03/19/13 0755 03/19/13 1203  GLUCAP 278* 279* 249* 317* 238*    No results found for this or any previous visit (from the past 240 hour(s)).   Studies:  Recent x-ray studies have been reviewed in detail by the Attending Physician  Scheduled Meds:  Scheduled Meds: . aspirin EC  325 mg Oral Daily  . atorvastatin  40 mg Oral q1800  . carvedilol  6.25 mg Oral BID WC  . diclofenac  75 mg Oral Daily   And  . misoprostol  200 mcg Oral Daily  . ferrous sulfate  325 mg Oral Q breakfast  . gabapentin  100 mg Oral TID  . insulin aspart  0-20 Units Subcutaneous TID WC  . insulin  aspart  0-5 Units Subcutaneous QHS  . [START ON 03/20/2013] insulin glargine  36 Units Subcutaneous QHS  . losartan  25 mg Oral Daily  . omega-3 acid ethyl esters  1 g Oral Daily   Continuous Infusions: . sodium chloride 75 mL/hr at 03/19/13 1400    Time spent on care of this patient: 25 min   [place]  [phone] Pager - Text Page per Shea Evans as per below:  On-Call/Text Page:      [email or web address]      password [email or web address]  If 7PM-7AM, please contact night-coverage [email or web address] Password [email or web address] 03/19/2013, 2:47 PM   LOS: 1 day

## 2013-03-19 NOTE — Evaluation (Signed)
Occupational Therapy Evaluation Patient Details Name: [name] MRN: [id number] DOB: 06/08/1951 Today's Date: 03/19/2013 Time: [id number] OT Time Calculation (min): 26 min  OT Assessment / Plan / Recommendation Clinical Impression  62 yo female admitted with n/v and found to have Rt PICA with chronic RT PCA and MCA. MRI reveals Lt veretral artery . Ot to follow acutely. Recommend SNF for d/c planning    OT Assessment  Patient needs continued OT Services    Follow Up Recommendations  SNF    Barriers to Discharge      Equipment Recommendations  Other (comment) (RW)    Recommendations for Other Services    Frequency  Min 2X/week    Precautions / Restrictions Precautions Precautions: Fall   Pertinent Vitals/Pain 5 out 10 HA    ADL  Grooming: Wash/dry hands;Wash/dry face;Set up Where Assessed - Grooming: Supine, head of bed up Upper Body Dressing: Maximal assistance Where Assessed - Upper Body Dressing: Supine, head of bed up (doff dirty gown don new gown) Toilet Transfer: +2 Total assistance Toilet Transfer: Patient Percentage: 80% Toilet Transfer Method: Sit to stand Toilet Transfer Equipment: Raised toilet seat with arms (or 3-in-1 over toilet) Transfers/Ambulation Related to ADLs: Pt with LOB stand pivot to chair. pt did not give warning for buckle or respond to questions about what happen. pt only states "I dont know" in a slurred speech with eyes closed" ADL Comments: Pt 's arousal level affect this evaluation. pt vomitting in floor on arrival and found hanging head over bed rail on left side. Pt did not request (A) or call for help. Pt provided wash cloth, clean gown and new bed linens. Environmental services cleaned the floor. Pt reluctant to change gown stating "it think its still clean" pt agreeable to oOB with encouragement. Pt reports dizziness HA , nausea and light sensitive at this time.    OT Diagnosis: Generalized weakness;Cognitive deficits  OT Problem List:  Decreased strength;Decreased activity tolerance;Impaired balance (sitting and/or standing);Pain;Decreased cognition;Decreased safety awareness;Decreased knowledge of use of DME or AE;Decreased knowledge of precautions OT Treatment Interventions: Self-care/ADL training;DME and/or AE instruction;Therapeutic activities;Cognitive remediation/compensation;Patient/family education;Balance training   OT Goals Acute Rehab OT Goals OT Goal Formulation: Patient unable to participate in goal setting Time For Goal Achievement: 04/02/13 Potential to Achieve Goals: Good ADL Goals Pt Will Perform Grooming: with modified independence;Standing at sink ADL Goal: Grooming - Progress: Goal set today Pt Will Perform Lower Body Bathing: with supervision;Sit to stand from chair ADL Goal: Lower Body Bathing - Progress: Goal set today Pt Will Perform Lower Body Dressing: with supervision;Sit to stand from chair ADL Goal: Lower Body Dressing - Progress: Goal set today Pt Will Transfer to Toilet: with supervision;Ambulation;3-in-1 ADL Goal: Toilet Transfer - Progress: Goal set today Pt Will Perform Toileting - Clothing Manipulation: with supervision;Sitting on 3-in-1 or toilet ADL Goal: Toileting - Clothing Manipulation - Progress: Goal set today Pt Will Perform Toileting - Hygiene: with supervision;Sit to stand from 3-in-1/toilet ADL Goal: Toileting - Hygiene - Progress: Goal set today Miscellaneous OT Goals Miscellaneous OT Goal #1: Pt will complete 20 minutes of therapeutic activities to demonstrate incr activity tolerance OT Goal: Miscellaneous Goal #1 - Progress: Goal set today  Visit Information  Last OT Received On: 03/19/13 Assistance Needed: +2 PT/OT Co-Evaluation/Treatment: Yes    Subjective Data  Subjective: "My daughter works" Patient Stated Goal: none provided   Prior Functioning     Home Living Lives With: Family (daughter and neice) Available Help at Discharge: Family;Available 24  hours/day Type of Home: House Home Access: Stairs to enter [employer] of Steps: 2  at side entrance  Entrance Stairs-Rails: None Home Layout: Two level;Bed/bath upstairs Alternate Level Stairs-Number of Steps: 13 Alternate Level Stairs-Rails: Left Bathroom Shower/Tub: Tub/shower unit;Curtain Bathroom Toilet: Standard Home Adaptive Equipment: None Prior Function Level of Independence: Independent Able to Take Stairs?: Yes Driving: No Communication Communication: No difficulties Dominant Hand: Right         Vision/Perception Vision - History Baseline Vision: No visual deficits Patient Visual Report: Other (comment) (clothing eyes 90 % of session reports dizziness) Vision - Assessment Eye Alignment: Within Functional Limits Vision Assessment: Vision not tested Additional Comments: pt opening eyes briefly for ~10 seconds max and closing them. Difficult to fully assess   Cognition  Cognition Arousal/Alertness: Lethargic Behavior During Therapy: Flat affect Overall Cognitive Status: Difficult to assess    Extremity/Trunk Assessment Right Upper Extremity Assessment RUE ROM/Strength/Tone: Due to impaired cognition;Unable to fully assess Left Upper Extremity Assessment LUE ROM/Strength/Tone: Unable to fully assess;Due to impaired cognition Trunk Assessment Trunk Assessment: Normal     Mobility Bed Mobility Bed Mobility: Supine to Sit;Sitting - Scoot to Edge of Bed Supine to Sit: 4: Min assist;HOB elevated Sitting - Scoot to [employer] of Bed: 4: Min assist Details for Bed Mobility Assistance: Pt using Rt UE to pull against rail into sitting position.  Transfers Transfers: Sit to Stand;Stand to Sit Sit to Stand: 1: +2 Total assist;With upper extremity assist;From bed Sit to Stand: Patient Percentage: 80% Stand to Sit: 1: +2 Total assist;With upper extremity assist;To chair/3-in-1 Details for Transfer Assistance: uncontrolled descend to chair and buckle. Pt with MOd  (A) for LOB and unable to correct. Pt unable to verbalize what happen during the event     Exercise     Balance     End of Session OT - End of Session Activity Tolerance: Patient limited by fatigue Patient left: in chair;with call bell/phone within reach Nurse Communication: Mobility status;Precautions  GO     Veneda Melter 03/19/2013, 3:09 PM Pager: [phone]

## 2013-03-19 NOTE — Consult Note (Signed)
Physical Medicine and Rehabilitation Consult Reason for Consult: CVA Referring Physician: [facility]   HPI: [name] is a 62 y.o. right-handed female history of hypertension and diabetes mellitus with peripheral neuropathy as well as chronic systolic diastolic congestive heart failure and chronic renal insufficiency with baseline creatinine 1.51. Admitted 03/18/2013 with headache, dizziness as well as nausea and vomiting. CT of the head with remote appearing right parietal occipital lobe infarct but no acute abnormalities. 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 Echocardiogram with ejection fraction 65-70% no wall motion abnormalities. Carotid Dopplers with no ICA stenosis. Patient did not receive TPA. Neurology services consulted placed on aspirin therapy for stroke prophylaxis. Physical occupational therapy evaluations are pending. M.D. is requested physical medicine rehabilitation consult to consider inpatient rehabilitation services Patient talking on phone appears comfortable in room with recliner. Patient states that she was independent prior to admission  Review of Systems  Gastrointestinal: Positive for nausea and vomiting.  Musculoskeletal: Positive for myalgias.  Neurological: Positive for dizziness.   Past Medical History  Diagnosis Date  . Hypertension   . Diabetes mellitus   . Hyperlipemia   . Cancer     endo ca   Past Surgical History  Procedure Laterality Date  . Abdominal hysterectomy    . Leg tendon surgery      patient fell in a store right leg  . Leg surgery      hole in bone( during Childhood)   History reviewed. No pertinent family history. Social History:  reports that she has never smoked. She has never used smokeless tobacco. She reports that she does not drink alcohol or use illicit drugs. Allergies: No Known Allergies Medications Prior to Admission  Medication Sig Dispense Refill  . insulin glargine (LANTUS) 100 UNIT/ML injection Inject  30 Units into the skin at bedtime.        . carvedilol (COREG) 6.25 MG tablet Take 1 tablet (6.25 mg total) by mouth 2 (two) times daily with a meal.  60 tablet  7  . Cholecalciferol (VITAMIN D3 PO) Take 1 tablet by mouth once a week. Takes on Thursday      . Diclofenac-Misoprostol 75-0.2 MG TBEC Take 1 tablet by mouth daily.      . ferrous sulfate 325 (65 FE) MG tablet Take 1 tablet (325 mg total) by mouth daily.  30 tablet  0  . fish oil-omega-3 fatty acids 1000 MG capsule Take 2 g by mouth daily.      Marland Kitchen gabapentin (NEURONTIN) 100 MG capsule Take 100 mg by mouth 3 (three) times daily.      Marland Kitchen glipiZIDE (GLUCOTROL XL) 10 MG 24 hr tablet Take 1 tablet (10 mg total) by mouth daily.  30 tablet  0  . levofloxacin (LEVAQUIN) 750 MG tablet Take 1 tablet (750 mg total) by mouth daily.  5 tablet  0  . losartan (COZAAR) 25 MG tablet Take 1 tablet (25 mg total) by mouth daily.  30 tablet  6  . metFORMIN (GLUCOPHAGE) 1000 MG tablet Take 1 tablet (1,000 mg total) by mouth 2 (two) times daily.  30 tablet  0  . metFORMIN (GLUCOPHAGE) 1000 MG tablet Take 1 tablet (1,000 mg total) by mouth 2 (two) times daily.  60 tablet  0  . nitroGLYCERIN (NITROSTAT) 0.4 MG SL tablet Place 0.4 mg under the tongue every 5 (five) minutes as needed. For chest pain      . simvastatin (ZOCOR) 80 MG tablet Take 1 tablet (80 mg  total) by mouth daily.  30 tablet  0    Home:    Functional History:   Functional Status:  Mobility:          ADL:    Cognition: Cognition Orientation Level: Oriented X4    Blood pressure 164/60, pulse 88, temperature 100.3 F (37.9 C), temperature source Oral, resp. rate 18, height 5\' 3"  (1.6 m), weight 110.2 kg (242 lb 15.2 oz), SpO2 95.00%. Physical Exam  Vitals reviewed. Constitutional: She is oriented to person, place, and time.  62 Y/O obese female.  HENT:  Head: Normocephalic.  Eyes:  Pupils reactive to light.  Neck: Neck supple. No thyromegaly present.  Cardiovascular: Normal  rate and regular rhythm.   Pulmonary/Chest: Effort normal and breath sounds normal. No respiratory distress.  Abdominal: Soft. Bowel sounds are normal. She exhibits no distension. There is no tenderness.  Neurological: She is alert and oriented to person, place, and time.  Follows three step commands  Skin: Skin is warm and dry.  Psychiatric: She has a normal mood and affect.  Motor strength: 5/5 in bilateral deltoid, biceps, triceps, grip, hip flexor, knee extensors, ankle dorsiflexor plantar flexor Cerebellar testing no evidence Of ataxia on finger-nose-finger or heel-to-shin testing Visual fields are intact to confrontation testing, extraocular muscles are intact, there is 1-2 beats of nystagmus toward the right on lateral gaze only Wide base of support Sit to stand with supervision/min contact assist  Results for orders placed during the hospital encounter of 03/18/13 (from the past 24 hour(s))  GLUCOSE, CAPILLARY     Status: Abnormal   Collection Time    03/18/13  9:27 AM      Result Value Range   Glucose-Capillary 414 (*) 70 - 99 mg/dL  GLUCOSE, CAPILLARY     Status: Abnormal   Collection Time    03/18/13 10:52 AM      Result Value Range   Glucose-Capillary 392 (*) 70 - 99 mg/dL  GLUCOSE, CAPILLARY     Status: Abnormal   Collection Time    03/18/13 12:36 PM      Result Value Range   Glucose-Capillary 407 (*) 70 - 99 mg/dL  GLUCOSE, CAPILLARY     Status: Abnormal   Collection Time    03/18/13  1:55 PM      Result Value Range   Glucose-Capillary 423 (*) 70 - 99 mg/dL   Comment 1 Notify RN    BASIC METABOLIC PANEL     Status: Abnormal   Collection Time    03/18/13  3:09 PM      Result Value Range   Sodium 141  135 - 145 mEq/L   Potassium 4.2  3.5 - 5.1 mEq/L   Chloride 103  96 - 112 mEq/L   CO2 25  19 - 32 mEq/L   Glucose, Bld 416 (*) 70 - 99 mg/dL   BUN 36 (*) 6 - 23 mg/dL   Creatinine, Ser 1.46 (*) 0.50 - 1.10 mg/dL   Calcium 9.7  8.4 - 10.5 mg/dL   GFR calc non Af  Amer 38 (*) >90 mL/min   GFR calc Af Amer 44 (*) >90 mL/min  GLUCOSE, CAPILLARY     Status: Abnormal   Collection Time    03/18/13  3:35 PM      Result Value Range   Glucose-Capillary 412 (*) 70 - 99 mg/dL   Comment 1 Documented in Chart    GLUCOSE, CAPILLARY     Status: Abnormal   Collection Time  03/18/13  4:42 PM      Result Value Range   Glucose-Capillary 331 (*) 70 - 99 mg/dL   Comment 1 Notify RN    GLUCOSE, CAPILLARY     Status: Abnormal   Collection Time    03/18/13  5:42 PM      Result Value Range   Glucose-Capillary 278 (*) 70 - 99 mg/dL   Comment 1 Notify RN    GLUCOSE, CAPILLARY     Status: Abnormal   Collection Time    03/18/13  6:28 PM      Result Value Range   Glucose-Capillary 279 (*) 70 - 99 mg/dL  URINALYSIS, ROUTINE W REFLEX MICROSCOPIC     Status: Abnormal   Collection Time    03/18/13  9:27 PM      Result Value Range   Color, Urine YELLOW  YELLOW   APPearance CLOUDY (*) CLEAR   Specific Gravity, Urine 1.021  1.005 - 1.030   pH 5.0  5.0 - 8.0   Glucose, UA >1000 (*) NEGATIVE mg/dL   Hgb urine dipstick MODERATE (*) NEGATIVE   Bilirubin Urine NEGATIVE  NEGATIVE   Ketones, ur NEGATIVE  NEGATIVE mg/dL   Protein, ur >300 (*) NEGATIVE mg/dL   Urobilinogen, UA 0.2  0.0 - 1.0 mg/dL   Nitrite NEGATIVE  NEGATIVE   Leukocytes, UA NEGATIVE  NEGATIVE  URINE MICROSCOPIC-ADD ON     Status: Abnormal   Collection Time    03/18/13  9:27 PM      Result Value Range   Squamous Epithelial / LPF RARE  RARE   RBC / HPF 3-6  <3 RBC/hpf   Bacteria, UA MANY (*) RARE   Casts GRANULAR CAST (*) NEGATIVE  GLUCOSE, CAPILLARY     Status: Abnormal   Collection Time    03/18/13  9:46 PM      Result Value Range   Glucose-Capillary 249 (*) 70 - 99 mg/dL  COMPREHENSIVE METABOLIC PANEL     Status: Abnormal   Collection Time    03/19/13  4:20 AM      Result Value Range   Sodium 141  135 - 145 mEq/L   Potassium 4.7  3.5 - 5.1 mEq/L   Chloride 106  96 - 112 mEq/L   CO2 26  19  - 32 mEq/L   Glucose, Bld 322 (*) 70 - 99 mg/dL   BUN 33 (*) 6 - 23 mg/dL   Creatinine, Ser 1.68 (*) 0.50 - 1.10 mg/dL   Calcium 9.4  8.4 - 10.5 mg/dL   Total Protein 7.2  6.0 - 8.3 g/dL   Albumin 2.8 (*) 3.5 - 5.2 g/dL   AST 10  0 - 37 U/L   ALT 7  0 - 35 U/L   Alkaline Phosphatase 105  39 - 117 U/L   Total Bilirubin 0.3  0.3 - 1.2 mg/dL   GFR calc non Af Amer 32 (*) >90 mL/min   GFR calc Af Amer 37 (*) >90 mL/min  CBC     Status: Abnormal   Collection Time    03/19/13  4:20 AM      Result Value Range   WBC 8.2  4.0 - 10.5 K/uL   RBC 3.59 (*) 3.87 - 5.11 MIL/uL   Hemoglobin 10.3 (*) 12.0 - 15.0 g/dL   HCT 30.1 (*) 36.0 - 46.0 %   MCV 83.8  78.0 - 100.0 fL   MCH 28.7  26.0 - 34.0 pg   MCHC 34.2  30.0 - 36.0 g/dL   RDW 15.1  11.5 - 15.5 %   Platelets 228  150 - 400 K/uL  MAGNESIUM     Status: None   Collection Time    03/19/13  4:20 AM      Result Value Range   Magnesium 2.0  1.5 - 2.5 mg/dL  LIPID PANEL     Status: Abnormal   Collection Time    03/19/13  4:20 AM      Result Value Range   Cholesterol 187  0 - 200 mg/dL   Triglycerides 282 (*) <150 mg/dL   HDL 36 (*) >39 mg/dL   Total CHOL/HDL Ratio 5.2     VLDL 56 (*) 0 - 40 mg/dL   LDL Cholesterol 95  0 - 99 mg/dL  GLUCOSE, CAPILLARY     Status: Abnormal   Collection Time    03/19/13  7:55 AM      Result Value Range   Glucose-Capillary 317 (*) 70 - 99 mg/dL   Comment 1 Notify RN     Comment 2 Documented in Chart     Dg Chest 2 View  03/19/2013  *RADIOLOGY REPORT*  Clinical Data: Stroke protocol.  No chest complaints.  CHEST - 2 VIEW  Comparison: 09/25/2012  Findings: Cardiac enlargement with mild pulmonary vascular congestion.  No definite edema.  No blunting of costophrenic angles.  No pneumothorax.  Mediastinal contours appear intact. Degenerative changes in the spine.  No significant change since previous study.  IMPRESSION: Cardiac enlargement with pulmonary vascular congestion.   Original Report Authenticated By:  Lucienne Capers, M.D.    Ct Head Wo Contrast  03/18/2013  *RADIOLOGY REPORT*  Clinical Data: Headache.  Diabetic hypertensive patient with hyperlipidemia.  Hyperglycemia.  CT HEAD WITHOUT CONTRAST  Technique:  Contiguous axial images were obtained from the base of the skull through the vertex without contrast.  Comparison: None.  Findings: No intracranial hemorrhage.  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  Remote tiny mid right corona radiata infarct.  Small vessel disease type changes.  No CT evidence of large acute infarct.  No intracranial mass lesion detected on this unenhanced exam.  No hydrocephalus.  Vascular calcifications.  Mastoid air cells, middle ear cavities and visualized sinuses are clear.  IMPRESSION: No intracranial hemorrhage.  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  Remote tiny mid right corona radiata infarct.  Small vessel disease type changes.  No CT evidence of large acute infarct.   Original Report Authenticated By: Genia Del, M.D.    Mr Angiogram Head Wo Contrast  03/18/2013  *RADIOLOGY REPORT*  Clinical Data: 62 year old female with dizziness weakness hyperglycemia unable to walk confusion and vomiting.  Possible stroke.  Comparison: Head CT without contrast 03/18/2013.  MRI HEAD WITHOUT CONTRAST  Technique:  Multiplanar, multiecho pulse sequences of the brain and surrounding structures were obtained according to standard protocol without intravenous contrast.  Findings: Confluent moderate to large area of restricted diffusion in the inferior right cerebellum (right PICA territory).  Early cytotoxic edema (series 7 image 7).  No significant posterior fossa mass effect at this time. No evidence of associated hemorrhage.  No left cerebellum or brainstem involvement.  There is right occipital lobe posterior superior encephalomalacia. Associated white matter gliosis.  However, on diffusion weighted imaging there are are some small curvilinear  areas along the periphery of the encephalomalacia which appear mildly restricted (series 3 image 19).  Furthermore, there is confluent cerebral white matter T2 and FLAIR  hyperintensity in both hemispheres, but a small area of subcortical white matter mildly restricted diffusion is suggested (series 3 image 21 and series 300 image 21).  This is in the posterior right MCA territory.  The distal right vertebral artery flow void cannot be identified. The distal left vertebral artery flow void is present.  Other Major intracranial vascular flow voids are preserved.  No ventriculomegaly. No acute intracranial hemorrhage identified. Negative pituitary, cervicomedullary junction and visualized cervical spine.  Deep gray matter nuclei and brain stem signal within normal limits.  Grossly normal visualized internal auditory structures. Visualized orbit soft tissues are within normal limits. Trace bilateral mastoid fluid.  Paranasal sinuses are clear. Visualized bone marrow signal is within normal limits.  Negative scalp soft tissues.  IMPRESSION: 1.  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 Early cytotoxic edema.  No associated hemorrhage or significant mass effect at this time. 2.  Subacute on chronic appearing right PCA and right MCA white matter infarcts.  Underlying chronic right occipital infarct with encephalomalacia corresponding to the earlier CT finding.  3.  See MRA findings below.  MRA HEAD WITHOUT CONTRAST  Technique:  Angiographic images of the Circle of Willis were obtained using MRA technique without intravenous contrast.  Findings:  Absent flow signal in the distal right vertebral artery which appears to be nondominant.  Right PICA origin probably on series 4 image 46 and also without flow signal.  Diminutive right AICA origin probably with preserved flow (image 65).  Preserved antegrade flow signal in a dominant appearing distal left vertebral artery.  Left PICA is patent.  The vessel now  supplies the basilar which is patent without stenosis but tortuous.  SCA origins are within normal limits.  Right PCA origin is normal. Fetal type left PCA origin.  Right posterior communicating artery is diminutive or absent.  Bilateral PCA branches are within normal limits.  Antegrade flow in both ICA siphons.  Mild ICA ectasia.  Ophthalmic and left posterior communicating artery origins are within normal limits.  No ICA stenosis.  Carotid termini are patent.  The left ACA A1 segment appears to be nondominant.  Alternatively, it might be stenotic.  Right ACA is within normal limits.  Anterior communicating artery within normal limits.  The right ACA A2 segment also appears somewhat dominant.  Visualized ACA branches are within normal limits.  Both MCA origins are within normal limits.  Mild bilateral M1 segment irregularity without stenosis. Both MCA bifurcations are patent.  Visualized bilateral MCA branches are within normal limits.  IMPRESSION: 1.  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  The distal left vertebral artery likely was nondominant. 2.  Otherwise negative posterior circulation. 3.  Mild anterior circulation atherosclerosis.  Nondominant versus atherosclerotic left ACA A1 segment.  Study discussed by telephone with Dr. Dorie Rank on 03/18/2013 at 1327 hours.   Original Report Authenticated By: Roselyn Reef, M.D.    Mr Brain Wo Contrast  03/18/2013  *RADIOLOGY REPORT*  Clinical Data: 62 year old female with dizziness weakness hyperglycemia unable to walk confusion and vomiting.  Possible stroke.  Comparison: Head CT without contrast 03/18/2013.  MRI HEAD WITHOUT CONTRAST  Technique:  Multiplanar, multiecho pulse sequences of the brain and surrounding structures were obtained according to standard protocol without intravenous contrast.  Findings: Confluent moderate to large area of restricted diffusion in the inferior right cerebellum (right PICA territory).  Early cytotoxic edema (series  7 image 7).  No significant posterior fossa mass effect at this time.  No evidence of associated hemorrhage.  No left cerebellum or brainstem involvement.  There is right occipital lobe posterior superior encephalomalacia. Associated white matter gliosis.  However, on diffusion weighted imaging there are are some small curvilinear areas along the periphery of the encephalomalacia which appear mildly restricted (series 3 image 19).  Furthermore, there is confluent cerebral white matter T2 and FLAIR hyperintensity in both hemispheres, but a small area of subcortical white matter mildly restricted diffusion is suggested (series 3 image 21 and series 300 image 21).  This is in the posterior right MCA territory.  The distal right vertebral artery flow void cannot be identified. The distal left vertebral artery flow void is present.  Other Major intracranial vascular flow voids are preserved.  No ventriculomegaly. No acute intracranial hemorrhage identified. Negative pituitary, cervicomedullary junction and visualized cervical spine.  Deep gray matter nuclei and brain stem signal within normal limits.  Grossly normal visualized internal auditory structures. Visualized orbit soft tissues are within normal limits. Trace bilateral mastoid fluid.  Paranasal sinuses are clear. Visualized bone marrow signal is within normal limits.  Negative scalp soft tissues.  IMPRESSION: 1.  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 Early cytotoxic edema.  No associated hemorrhage or significant mass effect at this time. 2.  Subacute on chronic appearing right PCA and right MCA white matter infarcts.  Underlying chronic right occipital infarct with encephalomalacia corresponding to the earlier CT finding.  3.  See MRA findings below.  MRA HEAD WITHOUT CONTRAST  Technique:  Angiographic images of the Circle of Willis were obtained using MRA technique without intravenous contrast.  Findings:  Absent flow signal in the distal  right vertebral artery which appears to be nondominant.  Right PICA origin probably on series 4 image 46 and also without flow signal.  Diminutive right AICA origin probably with preserved flow (image 65).  Preserved antegrade flow signal in a dominant appearing distal left vertebral artery.  Left PICA is patent.  The vessel now supplies the basilar which is patent without stenosis but tortuous.  SCA origins are within normal limits.  Right PCA origin is normal. Fetal type left PCA origin.  Right posterior communicating artery is diminutive or absent.  Bilateral PCA branches are within normal limits.  Antegrade flow in both ICA siphons.  Mild ICA ectasia.  Ophthalmic and left posterior communicating artery origins are within normal limits.  No ICA stenosis.  Carotid termini are patent.  The left ACA A1 segment appears to be nondominant.  Alternatively, it might be stenotic.  Right ACA is within normal limits.  Anterior communicating artery within normal limits.  The right ACA A2 segment also appears somewhat dominant.  Visualized ACA branches are within normal limits.  Both MCA origins are within normal limits.  Mild bilateral M1 segment irregularity without stenosis. Both MCA bifurcations are patent.  Visualized bilateral MCA branches are within normal limits.  IMPRESSION: 1.  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  The distal left vertebral artery likely was nondominant. 2.  Otherwise negative posterior circulation. 3.  Mild anterior circulation atherosclerosis.  Nondominant versus atherosclerotic left ACA A1 segment.  Study discussed by telephone with Dr. Dorie Rank on 03/18/2013 at 1327 hours.   Original Report Authenticated By: Roselyn Reef, M.D.     Assessment/Plan: Diagnosis: Right posterior inferior cerebellar artery infarct 1. Does the need for close, 24 hr/day medical supervision in concert with the patient's rehab needs make it unreasonable for this patient to be served  in a less  intensive setting? No 2. Co-Morbidities requiring supervision/potential complications: Diabetes 3. Due to safety and medication administration, does the patient require 24 hr/day rehab nursing? No 4. Does the patient require coordinated care of a physician, rehab nurse, not applicable to address physical and functional deficits in the context of the above medical diagnosis(es)? not applicable Addressing deficits in the following areas: balance, endurance, locomotion and transferring 5. Can the patient actively participate in an intensive therapy program of at least 3 hrs of therapy per day at least 5 days per week? Potentially 6. The potential for patient to make measurable gains while on inpatient rehab is Not applicable 7. Anticipated functional outcomes upon discharge from inpatient rehab are Not applicable with PT, Not applicable with OT, Not applicable with SLP. 8. Estimated rehab length of stay to reach the above functional goals is: Not applicable 9. Does the patient have adequate social supports to accommodate these discharge functional goals? Potentially 10. Anticipated D/C setting: Home 11. Anticipated post D/C treatments: [place] therapy 12. Overall Rehab/Functional Prognosis: good  RECOMMENDATIONS: This patient's condition is appropriate for continued rehabilitative care in the following setting: Outpt Patient has agreed to participate in recommended program. Yes Note that insurance prior authorization may be required for reimbursement for recommended care.  Comment:Patient has minimal neurologic deficits in is too high-level to justify inpatient rehabilitation    03/19/2013

## 2013-03-20 LAB — GLUCOSE, CAPILLARY
Glucose-Capillary: 164 mg/dL — ABNORMAL HIGH (ref 70–99)
Glucose-Capillary: 227 mg/dL — ABNORMAL HIGH (ref 70–99)
Glucose-Capillary: 302 mg/dL — ABNORMAL HIGH (ref 70–99)
Glucose-Capillary: 165 mg/dL — ABNORMAL HIGH (ref 70–99)
Glucose-Capillary: 233 mg/dL — ABNORMAL HIGH (ref 70–99)

## 2013-03-20 MED ORDER — AMLODIPINE BESYLATE 5 MG PO TABS
5.0000 mg | ORAL_TABLET | Freq: Every day | ORAL | Status: DC
  Administered 2013-03-21 – 2013-03-23 (×3): 5 mg via ORAL
  Filled 2013-03-20 (×3): qty 1

## 2013-03-20 MED ORDER — OXYCODONE HCL 5 MG PO TABS
5.0000 mg | ORAL_TABLET | ORAL | Status: DC | PRN
  Administered 2013-03-21: 5 mg via ORAL
  Administered 2013-03-21 – 2013-03-22 (×3): 10 mg via ORAL
  Administered 2013-03-23: 5 mg via ORAL
  Filled 2013-03-20: qty 2
  Filled 2013-03-20: qty 1
  Filled 2013-03-20 (×2): qty 2
  Filled 2013-03-20: qty 1
  Filled 2013-03-20: qty 2

## 2013-03-20 NOTE — Progress Notes (Signed)
Received pt a transfer from [id number] and follows command  Appropriately ,and moves all entreaties . Denied any c/o at present made comfortable in bed .  Cardiac monitor applied  Sinus  Rhythm . Marland Kitchen Temperature was 101.0  MD text and tylenol given .Marland Kitchen Will recheck later.

## 2013-03-20 NOTE — Progress Notes (Signed)
[facility] Progress Note [name] TEAM 1 - Stepdown/ICU TEAM   [name] [id number] DOB: [birth year] DOA: 03/18/2013 PCP: Dr Marvel Plan (pt does not know first name)   HPI- 4/30: Patient is a 62 year old female with past medical history significant for chronic kidney disease, chronic systolic and diastolic heart failure (2-D echo in 2011 with ejection fraction 55-60%), diabetes, hypertension who presented to [facility] ED 03/18/2013 with complaints of nausea, vomiting and dizziness started around 6:00 this morning prior to this admission. Patient reported associated headache, 7/10 in intensity, frontal, nonradiating. No blurry vision or double vision. No loss of consciousness. Patient had no complaints of slurred speech, weakness. No complaints of chest pain, shortness of breath or palpitations. No fever or chills. No complaints of abdominal pain, no diarrhea or constipation.  In ED, evaluation included CT head which was significant for remote appearing right parietal - occipital lobe infarct but no acute intracranial findings. MRI of the brain was significant for moderate to large confluent right PICA cerebellar infarct and early cytotoxic edema. In addition, her CBGs were elevated 400 range. BMET revealed slightly elevated at 13. Patient was started on insulin drip on admission. Her creatinine was elevated at 1.51 which is slightly above her baseline of 1.3.  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 1.5, previous 1.3.   Leukocytosis:  - Unlcear etiology- possible stress demargination - now resolved  Anemia of chronic disease:  - monitor Hbg.   Diabetic neuropathy:  - Continue gabapentin.   Dyslipidemia  - Continue simvastatin   HYPERTENSION  - Continue home medications, losartan   Chronic systolic and diastolic CHF  - 2 D ECHO in 2011 with ejection fraction 55-60% and repeat echo here remains stable    Code Status: FULL Family Communication: none Disposition Plan: Transfer to telemetry/4N  Consultants: neuro  Procedures: 2-D echocardiogram  Study Conclusions - Left ventricle: The cavity size was normal. Wall thickness was increased in a pattern of mild LVH. There was focal basal hypertrophy. Systolic function was vigorous. The estimated ejection fraction was in the range of 65% to 70%. Wall motion was normal; there were no regional wall motion abnormalities. - Left atrium: The atrium was mildly dilated. - Right atrium: The atrium was mildly dilated.  Antibiotics: none  DVT prophylaxis: SCDs and ASA  HPI/Subjective: Pt awake and complains of significant headache, with ongoing nausea.  Objective: Blood pressure 148/76, pulse 74, temperature 99.7 F (37.6 C), temperature source Oral, resp. rate 20, height 5\' 3"  (1.6 m), weight 111.9 kg (246 lb 11.1 oz), SpO2 93.00%.  Intake/Output Summary (Last 24 hours) at 03/20/13 1916 Last data filed at 03/20/13 1500  Gross per 24 hour  Intake 2555.83 ml  Output    700 ml  Net 1855.83 ml    Exam: General: No acute respiratory distress Lungs: Clear to auscultation bilaterally without wheezes or crackles Cardiovascular: Regular rate and rhythm  without murmur gallop or rub normal S1 and S2 Abdomen: Nontender, nondistended, soft, bowel sounds positive, no rebound, no ascites, no appreciable  mass Extremities: No significant cyanosis, clubbing, or edema bilateral lower extremities  Data Reviewed: Basic Metabolic Panel:  Recent Labs Lab 03/18/13 0845 03/18/13 1509 03/19/13 0420  NA 137 141 141  K 4.1 4.2 4.7  CL 102 103 106  CO2 22 25 26   GLUCOSE 448* 416* 322*  BUN 38* 36* 33*  CREATININE 1.51* 1.46* 1.68*  CALCIUM 9.7 9.7 9.4  MG  --   --  2.0   Liver Function Tests:  Recent Labs Lab 03/18/13 0845 03/19/13 0420  AST 11 10  ALT 9 7  ALKPHOS 115 105  BILITOT 0.2* 0.3  PROT 7.2 7.2  ALBUMIN 2.8* 2.8*   CBC:  Recent Labs Lab 03/18/13 0845 03/19/13 0420  WBC 11.6* 8.2  NEUTROABS 8.5*  --   HGB 10.4* 10.3*  HCT 30.8* 30.1*  MCV 83.9 83.8  PLT 243 228   Cardiac Enzymes:  Recent Labs Lab 03/18/13 0845  TROPONINI <0.30   BNP (last 3 results)  Recent Labs  09/25/12 1250  PROBNP 1010.0*   CBG:  Recent Labs Lab 03/19/13 1704 03/19/13 2121 03/20/13 0744 03/20/13 1226 03/20/13 1716  GLUCAP 221* 164* 227* 302* 165*    Recent Results (from the past 240 hour(s))  URINE CULTURE     Status: None   Collection Time    03/18/13  9:28 PM      Result Value Range Status   Specimen Description URINE, CLEAN CATCH   Final   Special Requests NONE   Final   Culture  Setup Time 03/18/2013 22:17   Final   Colony Count NO GROWTH   Final   Culture NO GROWTH   Final   Report Status 03/19/2013 FINAL   Final     Studies:  Recent x-ray studies have been reviewed in detail by the Attending Physician  Scheduled Meds:  Scheduled Meds: . aspirin EC  325 mg Oral Daily  . atorvastatin  40 mg Oral q1800  . carvedilol  6.25 mg Oral BID WC  . diclofenac  75 mg Oral Daily   And  . misoprostol  200 mcg Oral Daily  . ferrous sulfate  325 mg Oral Q breakfast  . gabapentin  100 mg Oral TID  . insulin aspart  0-20 Units Subcutaneous TID WC  . insulin aspart  0-5 Units Subcutaneous QHS  . insulin glargine  36 Units Subcutaneous QHS  . losartan  25 mg  Oral Daily  . omega-3 acid ethyl esters  1 g Oral Daily   Continuous Infusions: . sodium chloride 50 mL/hr at 03/20/13 1123    Time spent on care of this patient: 35 min   Erin Hearing, ANP  [facility] Office  [phone] Pager - Text Page per Shea Evans as per below:  On-Call/Text Page:      [email or web address]      password [email or web address]  If 7PM-7AM, please contact night-coverage [email or web address] Password [email or web address] 03/20/2013, 7:16 PM   LOS: 2 days   I have personally examined this patient and reviewed the entire database. I have reviewed the above note, made any necessary editorial changes, and agree with its content.  Cherene Altes, MD [facility]

## 2013-03-20 NOTE — Progress Notes (Signed)
Pt tx per MD order, pt VSS, family at [facility], pt and family verbalized understanding of tx, report called to receiving RN all questions answered

## 2013-03-20 NOTE — Progress Notes (Signed)
Stroke Team Progress Note  HISTORY [name] is an 62 y.o. female history of hypertension, hyperlipidemia, diabetes mellitus, chronic kidney disease and systolic and diastolic heart failure, who was brought to [facility] emergency room following acute onset of headache dizziness and nausea. She describes the dizziness as turning to falling type sensation. There is no previous history of stroke nor TIA. She has not been on antiplatelet therapy. CT scan of her head was unremarkable. 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 Subacute on chronic appearing right PCA and right MCA white matter infarcts were also noted. 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 NIH stroke score was 0.   Patient was not a TPA candidate secondary to beyond treatment teim. She was admitted to the neuro floor for further evaluation and treatment.  SUBJECTIVE Patient in room, sitting on edge of bed   OBJECTIVE Most recent Vital Signs: Filed Vitals:   03/20/13 0412 03/20/13 0457 03/20/13 0600 03/20/13 0744  BP: 136/58   143/75  Pulse: 85  82 77  Temp: 100.3 F (37.9 C)  99.4 F (37.4 C)   TempSrc: Oral  Oral   Resp: 17  19 17   Height:      Weight:  111.9 kg (246 lb 11.1 oz)    SpO2: 98%   100%   CBG (last 3)   Recent Labs  03/19/13 1704 03/19/13 2121 03/20/13 0744  GLUCAP 221* 164* 227*    IV Fluid Intake:   . sodium chloride 75 mL/hr at 03/20/13 0600    MEDICATIONS  . aspirin EC  325 mg Oral Daily  . atorvastatin  40 mg Oral q1800  . carvedilol  6.25 mg Oral BID WC  . diclofenac  75 mg Oral Daily   And  . misoprostol  200 mcg Oral Daily  . ferrous sulfate  325 mg Oral Q breakfast  . gabapentin  100 mg Oral TID  . insulin aspart  0-20 Units Subcutaneous TID WC  . insulin aspart  0-5 Units Subcutaneous QHS  . insulin glargine  36 Units Subcutaneous QHS  . losartan  25 mg Oral Daily  .  omega-3 acid ethyl esters  1 g Oral Daily   PRN:  acetaminophen, acetaminophen, dextrose, HYDROcodone-acetaminophen, morphine injection, ondansetron (ZOFRAN) IV, ondansetron  Diet:  Carb Control thin liquids Activity: Up with assistance DVT Prophylaxis:  SCD  CLINICALLY SIGNIFICANT STUDIES Basic Metabolic Panel:   Recent Labs Lab 03/18/13 1509 03/19/13 0420  NA 141 141  K 4.2 4.7  CL 103 106  CO2 25 26  GLUCOSE 416* 322*  BUN 36* 33*  CREATININE 1.46* 1.68*  CALCIUM 9.7 9.4  MG  --  2.0   Liver Function Tests:   Recent Labs Lab 03/18/13 0845 03/19/13 0420  AST 11 10  ALT 9 7  ALKPHOS 115 105  BILITOT 0.2* 0.3  PROT 7.2 7.2  ALBUMIN 2.8* 2.8*   CBC:   Recent Labs Lab 03/18/13 0845 03/19/13 0420  WBC 11.6* 8.2  NEUTROABS 8.5*  --   HGB 10.4* 10.3*  HCT 30.8* 30.1*  MCV 83.9 83.8  PLT 243 228   Coagulation:   Recent Labs Lab 03/18/13 0845  LABPROT 12.6  INR 0.95   Cardiac Enzymes:   Recent Labs Lab 03/18/13 0845  TROPONINI <0.30   Urinalysis:   Recent Labs Lab 03/18/13 0800 03/18/13 2127  COLORURINE YELLOW YELLOW  LABSPEC 1.020 1.021  PHURINE 6.0 5.0  GLUCOSEU >1000* >1000*  HGBUR TRACE* MODERATE*  BILIRUBINUR NEGATIVE NEGATIVE  KETONESUR NEGATIVE NEGATIVE  PROTEINUR 100* >300*  UROBILINOGEN 0.2 0.2  NITRITE NEGATIVE NEGATIVE  LEUKOCYTESUR NEGATIVE NEGATIVE   Lipid Panel    Component Value Date/Time   CHOL 187 03/19/2013 0420   TRIG 282* 03/19/2013 0420   HDL 36* 03/19/2013 0420   CHOLHDL 5.2 03/19/2013 0420   VLDL 56* 03/19/2013 0420   LDLCALC 95 03/19/2013 0420   HgbA1C  Lab Results  Component Value Date   HGBA1C 10.8* 03/19/2013   Urine Drug Screen:   No results found for this basename: labopia,  cocainscrnur,  labbenz,  amphetmu,  thcu,  labbarb    Alcohol Level: No results found for this basename: ETH,  in the last 168 hours  Dg Chest 2 View 03/19/2013  Cardiac enlargement with pulmonary vascular congestion.     Ct Head Wo  Contrast 03/19/2013 Moderate to large sized acute right cerebellar infarct with cytotoxic edema and local mass effect including distortion of the fourth ventricle. No obvious hemorrhage. Subtle petechial hemorrhage difficult to completely excluded. Presently no hydrocephalus.  Subacute to chronic right parietal lobe and right corona radiata infarcts.  03/18/2013 No intracranial hemorrhage.  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  Remote tiny mid right corona radiata infarct.  Small vessel disease type changes.  No CT evidence of large acute infarct.     MRI Head 03/18/2013: 1.  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 Early cytotoxic edema.  No associated hemorrhage or significant mass effect at this time. 2.  Subacute on chronic appearing right PCA and right MCA white matter infarcts.  Underlying chronic right occipital infarct with encephalomalacia corresponding to the earlier CT finding.     MRA HEAD 03/18/2013  1.  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  The distal left vertebral artery likely was nondominant. 2.  Otherwise negative posterior circulation. 3.  Mild anterior circulation atherosclerosis.  Nondominant versus atherosclerotic left ACA A1 segment.  Study discussed by telephone with Dr. Dorie Rank on 03/18/2013 at 1327 hours.     2D Echocardiogram Left ventricle: The cavity size was normal. Wall thickness was increased in a pattern of mild LVH. There was focal basal hypertrophy. Systolic function was vigorous. The estimated ejection fraction was in the range of 65% to 70%. Wall motion was normal; there were no regional wall motion abnormalities.- Left atrium: The atrium was mildly dilated.- Right atrium: The atrium was mildly dilated  Carotid Doppler  Bilaterally <40% internal carotid artery stenosis  CXR  Cardiac enlargement with pulmonary vascular congestion.  EKG  normal sinus rhythm.   Therapy Recommendations HH per PT, SNF per  OT  Physical Exam    Neurologic Examination:  Mental Status:  Awake alert, thought content appropriate. Speech fluent without evidence of aphasia. Able to follow commands without difficulty.  Cranial Nerves:  II-Visual fields were normal.  III/IV/VI-Pupils were equal and reacted. Extraocular movements were full and conjugate. No nystagmus V/VII-no facial numbness and no facial weakness.  VIII-normal.  Anselmo Pickler was slightly slurred but consistent with being edentulous; symmetrical palatal movement.  Motor: 5/5 bilaterally with normal tone and bulk  Sensory: Normal throughout.  Deep Tendon Reflexes: Trace to 1+ and symmetric.  Plantars: Mute bilaterally  Cerebellar: Normal finger-to-nose testing.  Carotid auscultation: Normal  ASSESSMENT [name] is a 62 y.o. female presenting with headache, dizziness and nausea.  Imaging confirms a large right PICA cerebellar  infarct with cytotoxic edema. Infarct felt to be thrombotic.  On no anticoagulants prior to admission. Now on aspirin 325 mg orally every day for secondary stroke prevention. Patient with resultant headache and vertigo. Work up completed.   Diabetes mellitus with neuropathy, uncontrolled, type 2, HgbA1c 10.8  Hyperlipidemia, LDL 95, on pravachol 80mg  PTA,  switched to Lipitor 40mg  daily, goal LDL < 70 in diabetes  Hypertension  Chronic kidney disease,-stage 3, baselin Cr 1.5  Anemia of chronic disease  Leukocytosis, resolved  Hospital day # 2  TREATMENT/PLAN   Continue aspirin 325 mg orally every day for secondary stroke prevention. Ongoing risk factor modification by Primary Care Physician  Goal LDL < 70  Goal HgbA1c < 7  OP therapies vs CIR. Decision pending No further stroke workup indicated. Patient has a 10-15% risk of having another stroke over the next year, the highest risk is within 2 weeks of the most recent stroke/TIA (risk of having a stroke following a stroke or TIA is the same). Stroke  Service will sign off. Please call should any needs arise.  Follow up with Dr. Leonie Man, [facility], in 2 months.  Burnetta Sabin, MSN, RN, ANVP-BC, ANP-BC, [facility] Pager: [phone] 03/20/2013 9:01 AM  I have personally obtained a history, examined the patient, evaluated imaging results, and formulated the assessment and plan of care. I agree with the above. Antony Contras, MD

## 2013-03-20 NOTE — Progress Notes (Signed)
Rehab admissions - Evaluated for possible admission.  Please see rehab consult done by Dr. Letta Pate recommending outpatient therapies.  Call me for questions.  RC:[phone]

## 2013-03-20 NOTE — Care Management Note (Signed)
    Page 1 of 1   03/23/2013     4:52:57 PM   CARE MANAGEMENT NOTE 03/23/2013  Patient:  Hogate,[place]   Account Number:  [id number]  Date Initiated:  03/20/2013  Documentation initiated by:  Marvetta Gibbons  Subjective/Objective Assessment:   Pt admitted with CVA     Action/Plan:   PTA pt lived at home - PT/OT evals   Anticipated DC Date:  03/23/2013   Anticipated DC Plan:  [place]  In-house referral  Clinical Social Worker      DC Planning Services  CM consult      Choice offered to / List presented to:             Status of service:  Completed, signed off Medicare Important Message given?   (If response is "NO", the following Medicare IM given date fields will be blank) Date Medicare IM given:   Date Additional Medicare IM given:    Discharge Disposition:  [place]  Per UR Regulation:  Reviewed for med. necessity/level of care/duration of stay  If discussed at [place] of Stay Meetings, dates discussed:    Comments:  03/20/13- 61- Marvetta Gibbons RN, BSN- [phone] Attempted to speak with pt at bedside- pt not very verbal during conversation- answers kept short- eyes remained closed during conversation. Pt did state that her daughter lives with her at home. Per PT they are recommending CIR for rehab- will f/u with CIR- pt will  most likely need CIR vs SNF prior to returning home.

## 2013-03-20 NOTE — Progress Notes (Signed)
Physical Therapy Treatment Patient Details Name: [name] MRN: [id number] DOB: 06/02/1951 Today's Date: 03/20/2013 Time: [id number] PT Time Calculation (min): 13 min  PT Assessment / Plan / Recommendation Comments on Treatment Session  Pt adm with rt cerebellar CVA.  Pt keeps her eyes closed >95% of the session.   She reports dizziness.  Pt with significant functional deficits and could benefit from CIR. Will also have another PT perform vestibular assessment.    Follow Up Recommendations  CIR     Does the patient have the potential to tolerate intense rehabilitation     Barriers to Discharge        Equipment Recommendations  Rolling walker with 5" wheels    Recommendations for Other Services    Frequency Min 4X/week   Plan Discharge plan needs to be updated    Precautions / Restrictions Precautions Precautions: Fall   Pertinent Vitals/Pain Headache. VSS    Mobility  Bed Mobility Supine to Sit: 5: Supervision;With rails;HOB elevated Sitting - Scoot to Edge of Bed: 5: Supervision Transfers Sit to Stand: 1: +2 Total assist;From bed Sit to Stand: Patient Percentage: 80% Stand to Sit: To chair/3-in-1;3: Mod assist Details for Transfer Assistance: Pt pulling up on walker with hands despite verbal/tactile cues.  Assist for balance sit to stand and to control descent when sitting. Ambulation/Gait Ambulation/Gait Assistance: 4: Min assist;3: Mod assist (+2 for lines/tubes and safety.) Ambulation Distance (Feet): 60 Feet Assistive device: Rolling walker Ambulation/Gait Assistance Details: Pt with lean to the right which worsened with incr distance requiring incr assisstance.  Pt unable to verbalize that anything was wrong. Gait Pattern: Step-through pattern;Decreased step length - right;Decreased step length - left Gait velocity: decr General Gait Details: Multiple verbal cues to open eyes and to engage with [place] during amb.    Exercises     PT Diagnosis:    PT Problem  List:   PT Treatment Interventions:     PT Goals Acute Rehab PT Goals Pt will go Supine/Side to Sit: with modified independence PT Goal: Supine/Side to Sit - Progress: Updated due to goal met PT Goal: Sit to Stand - Progress: Progressing toward goal PT Transfer Goal: Bed to Chair/Chair to Bed - Progress: Progressing toward goal PT Goal: Stand - Progress: Progressing toward goal PT Goal: Ambulate - Progress: Progressing toward goal  Visit Information  Last PT Received On: 03/20/13 Assistance Needed: +2 (for lines/tubes)    Subjective Data  Subjective: Pt reports being dizzy still.   Cognition  Cognition Arousal/Alertness: Lethargic Behavior During Therapy: Flat affect Overall Cognitive Status: Difficult to assess Difficult to assess due to:  (Pt difficult to verbally engage)    Balance  Static Sitting Balance Static Sitting - Balance Support: Feet supported Static Sitting - Level of Assistance: 5: Stand by assistance Static Standing Balance Static Standing - Balance Support: Bilateral upper extremity supported (on walker) Static Standing - Level of Assistance: 4: Min assist;3: Mod assist (Incr assistance as pt fatigued.)  End of Session PT - End of Session Equipment Utilized During Treatment: Gait belt Activity Tolerance: Patient limited by fatigue Patient left: in chair;with call bell/phone within reach Nurse Communication: Mobility status (talked to nursing tech)   GP     [place] 03/20/2013, 1:14 PM  [employer] PT [phone]

## 2013-03-20 NOTE — Progress Notes (Signed)
5/2  Spoke with sister and daughter in patient's room.  Is able to get medications for diabetes from physician.  Had not taken insulin the day she was admitted because it was running out, according to her sister.  States that patient needs a home blood glucose meter and strips to check blood sugars at home.  Sister to check with physician's office about meter.  Suggested that she could purchase a [employer] meter and strips if needed.  Sister said she would like to have patient talk to a dietician before discharge.  IP rehab being discussed.  Will continue to follow while in hospital.  Harvel Ricks RN BSN CDE

## 2013-03-21 DIAGNOSIS — R112 Nausea with vomiting, unspecified: Secondary | ICD-10-CM

## 2013-03-21 LAB — BASIC METABOLIC PANEL
BUN: 29 mg/dL — ABNORMAL HIGH (ref 6–23)
CO2: 23 mEq/L (ref 19–32)
Calcium: 8.5 mg/dL (ref 8.4–10.5)
Creatinine, Ser: 1.71 mg/dL — ABNORMAL HIGH (ref 0.50–1.10)
GFR calc non Af Amer: 31 mL/min — ABNORMAL LOW (ref >90)
Glucose, Bld: 203 mg/dL — ABNORMAL HIGH (ref 70–99)
Sodium: 139 mEq/L (ref 135–145)

## 2013-03-21 LAB — CBC
MCH: 27.9 pg (ref 26.0–34.0)
MCHC: 33.3 g/dL (ref 30.0–36.0)
MCV: 83.7 fL (ref 78.0–100.0)
Platelets: 197 10*3/uL (ref 150–400)
RBC: 3.26 MIL/uL — ABNORMAL LOW (ref 3.87–5.11)
RDW: 15 % (ref 11.5–15.5)

## 2013-03-21 LAB — GLUCOSE, CAPILLARY
Glucose-Capillary: 202 mg/dL — ABNORMAL HIGH (ref 70–99)
Glucose-Capillary: 190 mg/dL — ABNORMAL HIGH (ref 70–99)
Glucose-Capillary: 201 mg/dL — ABNORMAL HIGH (ref 70–99)
Glucose-Capillary: 197 mg/dL — ABNORMAL HIGH (ref 70–99)

## 2013-03-21 NOTE — Progress Notes (Addendum)
Clinical Social Work Department CLINICAL SOCIAL WORK PLACEMENT NOTE 03/21/2013  Patient:  [name], [name]  Account Number:  [id number] Admit date:  03/18/2013  Clinical Social Worker:  Sindy Messing, LCSW  Date/time:  03/21/2013 03:30 PM  Clinical Social Work is seeking post-discharge placement for this patient at the following level of care:   SKILLED NURSING   (*CSW will update this form in Epic as items are completed)   03/21/2013  Patient/family provided with [facility] Department of Clinical Social Work's list of facilities offering this level of care within the geographic area requested by the patient (or if unable, by the patient's family).  03/21/2013  Patient/family informed of their freedom to choose among providers that offer the needed level of care, that participate in Medicare, Medicaid or managed care program needed by the patient, have an available bed and are willing to accept the patient.  03/21/2013  Patient/family informed of [facility]' ownership interest in [facility], as well as of the fact that they are under no obligation to receive care at this facility.  PASARR submitted to EDS on 03/21/2013 PASARR number received from EDS on 03/21/2013  FL2 transmitted to all facilities in geographic area requested by pt/family on  03/21/2013 FL2 transmitted to all facilities within larger geographic area on   Patient informed that his/her managed care company has contracts with or will negotiate with  certain facilities, including the following:     Patient/family informed of bed offers received:  03/23/2013 Patient chooses bed at [facility]  Physician recommends and patient chooses bed at  [facility]  Patient to be transferred to [facility] on 03/23/2013 Patient to be transferred to facility by [facility]  The following physician request were entered in Epic:   Additional Comments:

## 2013-03-21 NOTE — Progress Notes (Signed)
Physical Therapy Treatment Patient Details Name: [name] MRN: [id number] DOB: Mar 23, 1951 Today's Date: 03/21/2013 Time: [id number] PT Time Calculation (min): 24 min  PT Assessment / Plan / Recommendation Comments on Treatment Session  Pt continues to c/o dizziness and unable to keep eyes open to complete vestibular assessment.  Pt reported 10/10 dizziness in standing limiting ambulation.  Noticeable mild nystagmus with impaired smooth pursuit.  Also noticeable right strabismus and pt reports occasional double vision.  OT plans to further assess vision next session.  Continue to agree with CIR.     Follow Up Recommendations  CIR     Does the patient have the potential to tolerate intense rehabilitation     Barriers to Discharge        Equipment Recommendations  Rolling walker with 5" wheels    Recommendations for Other Services Rehab consult  Frequency Min 4X/week   Plan Discharge plan remains appropriate;Frequency remains appropriate    Precautions / Restrictions Precautions Precautions: Fall Restrictions Weight Bearing Restrictions: No   Pertinent Vitals/Pain No c/o pain    Mobility  Bed Mobility Bed Mobility: Rolling Right;Right Sidelying to Sit Rolling Right: 5: Supervision;With rail Right Sidelying to Sit: 5: Supervision;With rails;HOB elevated Details for Bed Mobility Assistance: Educated pt on fixating on objects as pt rolled and sat EOB.  However questionable if pt understands due to difficulty to focus on directions.   Transfers Transfers: Sit to Stand;Stand to Sit Sit to Stand: 4: Min assist;From bed Stand to Sit: 1: +2 Total assist;To chair/3-in-1 Stand to Sit: Patient Percentage: 60% Details for Transfer Assistance: (A) to initiate transfer with max cues for hand placement.  +2 (A) to slowly descend to recliner with max cues for safety.  Pt attempts to sit prior to proper position. Ambulation/Gait Ambulation/Gait Assistance: 4: Min assist;3: Mod  assist Ambulation Distance (Feet): 5 Feet Assistive device: Rolling walker Ambulation/Gait Assistance Details: Pt with increase dizziness this AM and needed to sit suddenly after ambulation 5'.  Pt with heavy lean to right side causing LOB and needed (A) to prevent from falling.  Continue to give compensation techniques but pt not able to focus on directions. Gait Pattern: Step-through pattern;Decreased step length - right;Decreased step length - left Gait velocity: decr    Exercises     PT Diagnosis:    PT Problem List:   PT Treatment Interventions:     PT Goals Acute Rehab PT Goals PT Goal Formulation: With patient Time For Goal Achievement: 04/02/13 Potential to Achieve Goals: Good Pt will go Supine/Side to Sit: with modified independence PT Goal: Supine/Side to Sit - Progress: Progressing toward goal Pt will go Sit to Stand: with supervision PT Goal: Sit to Stand - Progress: Progressing toward goal Pt will Transfer Bed to Chair/Chair to Bed: with supervision PT Transfer Goal: Bed to Chair/Chair to Bed - Progress: Progressing toward goal Pt will Stand: with supervision;with unilateral upper extremity support;3 - 5 min PT Goal: Stand - Progress: Progressing toward goal Pt will Ambulate: >150 feet;with least restrictive assistive device;with supervision PT Goal: Ambulate - Progress: Progressing toward goal  Visit Information  Last PT Received On: 03/21/13 Assistance Needed: +2 (with ambulation)    Subjective Data  Subjective: Pt reports increase dizziness with mobility. Patient Stated Goal: None stated   Cognition  Cognition Arousal/Alertness: Awake/alert Behavior During Therapy: Restless Overall Cognitive Status: Difficult to assess Difficult to assess due to:  (Pt focused on dizziness and does not engage in conversation.)    Balance  Static  Sitting Balance Static Sitting - Balance Support: Feet supported Static Sitting - Level of Assistance: 5: Stand by  assistance Static Sitting - Comment/# of Minutes: Pt with mild lateral sway with initial upright sitting. Static Standing Balance Static Standing - Balance Support: Bilateral upper extremity supported Static Standing - Level of Assistance: 3: Mod assist  End of Session PT - End of Session Equipment Utilized During Treatment: Gait belt Activity Tolerance: Patient limited by fatigue Patient left: in chair;with call bell/phone within reach;with chair alarm set Nurse Communication: Mobility status (Nursing OOB instability and right sided lean)   GP     Atthew Coutant 03/21/2013, 9:21 AM Antoine Poche, PT DPT [phone]

## 2013-03-21 NOTE — Progress Notes (Signed)
Clinical Social Work Department BRIEF PSYCHOSOCIAL ASSESSMENT 03/21/2013  Patient:  [name],[name]     Account Number:  [id number]     Admit date:  03/18/2013  Clinical Social Worker:  Earlie Server  Date/Time:  03/21/2013 03:20 PM  Referred by:  Physician  Date Referred:  03/21/2013 Referred for  SNF Placement   Other Referral:   Interview type:  Patient Other interview type:    PSYCHOSOCIAL DATA Living Status:  FAMILY Admitted from facility:   Level of care:   Primary support name:  Shavonne Primary support relationship to patient:  CHILD, ADULT Degree of support available:   Strong    CURRENT CONCERNS Current Concerns  Post-Acute Placement   Other Concerns:    SOCIAL WORK ASSESSMENT / PLAN CSW received referral to assist with dc planning. CSW reviewed chart and met with patient and two dtrs in room. Patient agreeable to family involvement during assessment.    CSW introduced myself and explained role. Patient reports that she lives with dtr and prefers to return home. Dtrs encouraged patient to talk with CSW regarding SNF placement. CSW provided SNF list and explained process. CSW explained copays associated with insurance. Patient reports she is still undecided between SNF and HH. Patient requests to speak about [facility] as well. CSW alerted CM of possible needs.    CSW encouraged patient to complete search if interested in SNF in order to have options at DC. Patient agreeable to [facility] search. CSW completed FL2 and placed in chart. CSW will follow up with bed offers.   Assessment/plan status:  Psychosocial Support/Ongoing Assessment of Needs Other assessment/ plan:   Information/referral to community resources:   SNF list    PATIENT'S/FAMILY'S RESPONSE TO PLAN OF CARE: Patient alert and oriented during assessment. Patient minimally engaged and allowed dtrs to ask most of questions. Patient agreeable to discuss options with dtr.       Weekend Coverage

## 2013-03-21 NOTE — Progress Notes (Signed)
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 1.5, previous 1.3.   Leukocytosis:  - Unlcear etiology- possible stress demargination - now resolved   Anemia of chronic disease:  - monitor Hbg.   Diabetic neuropathy:  - Continue gabapentin.   Dyslipidemia  - Continue simvastatin   HYPERTENSION  - Continue home medications, losartan   Chronic systolic and diastolic CHF  - 2 D ECHO in 2011 with ejection fraction 55-60% and repeat echo here remains stable   Code Status: FULL  Family Communication: none  Disposition Plan: SNF?  Consultants:  neuro   Procedures:  2-D echocardiogram  Study Conclusions - Left ventricle: The cavity size was normal. Wall thickness was increased in a pattern of mild LVH. There was focal basal hypertrophy. Systolic function was vigorous. The estimated ejection fraction was in the range of 65% to 70%. Wall motion was normal; there were no regional wall motion abnormalities. - Left atrium: The atrium was mildly dilated. - Right atrium: The atrium was mildly dilated.   Antibiotics:  none   DVT prophylaxis:  SCDs and ASA    HPI/Subjective: No new c/o  Objective: Filed Vitals:   03/21/13 0500 03/21/13 0514 03/21/13  0614 03/21/13 1021  BP:  177/85 148/56 137/58  Pulse:  80 80 70  Temp:  99.5 F (37.5 C)  99 F (37.2 C)  TempSrc:  Oral  Oral  Resp:  20  18  Height:      Weight: 118.3 kg (260 lb 12.9 oz) 118.3 kg (260 lb 12.9 oz)    SpO2:  100%  100%    Intake/Output Summary (Last 24 hours) at 03/21/13 1035 Last data filed at 03/20/13 1900  Gross per 24 hour  Intake 1330.83 ml  Output    200 ml  Net 1130.83 ml   Filed Weights   03/20/13 0457 03/21/13 0500 03/21/13 0514  Weight: 111.9 kg (246 lb 11.1 oz) 118.3 kg (260 lb 12.9 oz) 118.3 kg (260 lb 12.9 oz)    Exam:   General:  A+Ox3, NAD  Cardiovascular: rrr  Respiratory: clear anterior  Abdomen: +BS, soft, NT  Musculoskeletal: mild weakness   Data Reviewed: Basic Metabolic Panel:  Recent Labs Lab 03/18/13 0845 03/18/13 1509 03/19/13 0420 03/21/13 0510  NA 137 141 141 139  K 4.1 4.2 4.7 3.8  CL 102 103 106 107  CO2 22 25 26 23   GLUCOSE 448* 416* 322* 203*  BUN 38* 36* 33* 29*  CREATININE 1.51* 1.46* 1.68* 1.71*  CALCIUM 9.7 9.7 9.4 8.5  MG  --   --  2.0  --    Liver Function Tests:  Recent Labs Lab 03/18/13 0845 03/19/13 0420  AST 11 10  ALT 9 7  ALKPHOS 115 105  BILITOT 0.2* 0.3  PROT 7.2 7.2  ALBUMIN 2.8* 2.8*   No results found for this basename: LIPASE, AMYLASE,  in the last 168 hours No results found for this basename: AMMONIA,  in the last 168 hours CBC:  Recent Labs Lab 03/18/13 0845 03/19/13 0420 03/21/13 0510  WBC 11.6* 8.2 8.2  NEUTROABS 8.5*  --   --   HGB 10.4* 10.3* 9.1*  HCT 30.8* 30.1* 27.3*  MCV 83.9 83.8 83.7  PLT 243 228 197   Cardiac Enzymes:  Recent Labs Lab 03/18/13 0845  TROPONINI <0.30   BNP (last 3 results)  Recent Labs  09/25/12 1250  PROBNP 1010.0*   CBG:  Recent Labs Lab 03/20/13 0744 03/20/13 1226 03/20/13 1716 03/20/13 2127 03/21/13 0643  GLUCAP 227* 302* 165* 233* 202*    Recent Results (from the past 240 hour(s))  URINE CULTURE     Status:  None   Collection Time    03/18/13  9:28 PM      Result Value Range Status   Specimen Description URINE, CLEAN CATCH   Final   Special Requests NONE   Final   Culture  Setup Time 03/18/2013 22:17   Final   Colony Count NO GROWTH   Final   Culture NO GROWTH   Final   Report Status 03/19/2013 FINAL   Final     Studies: Ct Head Wo Contrast  03/19/2013  *RADIOLOGY REPORT*  Clinical Data: Right cerebellar infarct.  Follow-up.  CT HEAD WITHOUT CONTRAST  Technique:  Contiguous axial images were obtained from the base of the skull through the vertex without contrast.  Comparison: 03/18/2013 MR and CT.  Findings: Moderate to large sized acute right cerebellar infarct with cytotoxic edema and local mass effect including distortion of the fourth ventricle.  No obvious hemorrhage.  Subtle petechial hemorrhage difficult to completely excluded.  Presently no hydrocephalus.  Given the position of the infarct and size of the infarct, this will need to be monitored closely to exclude hemorrhagic transformation and / or development of hydrocephalus.  Subacute to chronic right parietal lobe and right corona radiata infarcts.  Small vessel disease type changes.  No intracranial mass lesion detected on this unenhanced exam.  Vascular calcifications.  IMPRESSION: Moderate to large sized acute right cerebellar infarct with cytotoxic edema and local mass effect including distortion of the fourth ventricle.  No obvious hemorrhage.  Subtle petechial hemorrhage difficult to completely excluded.  Presently no hydrocephalus.  Given the position of the infarct and size of the infarct, this will need to be monitored closely to exclude hemorrhagic transformation and / or development of hydrocephalus.  Subacute to chronic right parietal lobe and right corona radiata infarcts.   Original Report Authenticated By: Genia Del, M.D.     Scheduled Meds: . amLODipine  5 mg Oral Q1200  . aspirin EC  325 mg Oral Daily  . atorvastatin   40 mg Oral q1800  . carvedilol  6.25 mg Oral BID WC  . diclofenac  75 mg Oral Daily   And  . misoprostol  200 mcg Oral Daily  . ferrous sulfate  325 mg Oral Q breakfast  . gabapentin  100 mg Oral TID  . insulin aspart  0-20 Units Subcutaneous TID WC  . insulin aspart  0-5 Units Subcutaneous QHS  . insulin glargine  36 Units Subcutaneous QHS  . omega-3 acid ethyl esters  1 g Oral Daily   Continuous Infusions: . sodium chloride 50 mL/hr at 03/20/13 1123  Principal Problem:   CVA (cerebral infarction) Active Problems:   DM   HYPERTENSION   Nausea and vomiting   Chronic kidney disease, stage 3   Leukocytosis   Anemia of chronic disease   Non-ketotic hyperosmolar coma    Time spent: [place], [place] [facility] Pager [phone]. If 7PM-7AM, please contact night-coverage at [email or web address], password [facility] 03/21/2013, 10:35 AM  LOS: 3 days

## 2013-03-21 NOTE — Progress Notes (Signed)
Pt. Had told Dr. Eliseo Squires she was agreeable to going to [facility], as was her daughter, but then  She told me that she wanted to go home,  Encouraged her to consider the SNF so she would be stronger when she got home.  No response

## 2013-03-22 LAB — GLUCOSE, CAPILLARY
Glucose-Capillary: 264 mg/dL — ABNORMAL HIGH (ref 70–99)
Glucose-Capillary: 174 mg/dL — ABNORMAL HIGH (ref 70–99)

## 2013-03-22 MED ORDER — MORPHINE SULFATE 2 MG/ML IJ SOLN: 2.0000 mg | INTRAMUSCULAR | Status: DC | PRN

## 2013-03-22 MED ORDER — SENNOSIDES-DOCUSATE SODIUM 8.6-50 MG PO TABS: 1.0000 | ORAL_TABLET | Freq: Every evening | ORAL | Status: DC | PRN

## 2013-03-22 MED ORDER — INSULIN GLARGINE 100 UNIT/ML ~~LOC~~ SOLN
38.0000 [IU] | Freq: Every day | SUBCUTANEOUS | Status: DC
  Administered 2013-03-22: 38 [IU] via SUBCUTANEOUS
  Filled 2013-03-22 (×2): qty 0.38

## 2013-03-22 MED ORDER — POLYETHYLENE GLYCOL 3350 17 G PO PACK
17.0000 g | PACK | Freq: Every day | ORAL | Status: DC | PRN
  Administered 2013-03-22: 17 g via ORAL
  Filled 2013-03-22: qty 1

## 2013-03-22 NOTE — Progress Notes (Signed)
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 1.5  Leukocytosis:  - Unlcear etiology- possible stress demargination - now resolved   Anemia of chronic disease:  - monitor Hbg.   Diabetic neuropathy:  - Continue gabapentin.   Dyslipidemia  - Continue simvastatin   HYPERTENSION  - Continue home medications, losartan   Chronic systolic and diastolic CHF  - 2 D ECHO in 2011 with ejection fraction 55-60% and repeat echo here remains stable   Code Status: FULL  Family Communication: none  Disposition Plan: SNF?  Consultants:  neuro   Procedures:  2-D echocardiogram  Study Conclusions - Left ventricle: The cavity size was normal. Wall thickness was increased in a pattern of mild LVH. There was focal basal hypertrophy. Systolic function was vigorous. The estimated ejection fraction was in the range of 65% to 70%. Wall motion was normal; there were no regional wall motion abnormalities. - Left atrium: The atrium was mildly dilated. - Right atrium: The atrium was mildly dilated.   Antibiotics:  none   DVT prophylaxis:  SCDs and ASA    HPI/Subjective: C/o headache- better than before but still present  Objective: Filed Vitals:   03/21/13 2136  03/22/13 0120 03/22/13 0500 03/22/13 0550  BP: 146/73 165/76  146/63  Pulse: 70 74  76  Temp: 99.1 F (37.3 C) 98.7 F (37.1 C)  100 F (37.8 C)  TempSrc: Oral Oral  Oral  Resp: 18 18  16   Height:      Weight:   114.4 kg (252 lb 3.3 oz)   SpO2: 100% 97%  99%    Intake/Output Summary (Last 24 hours) at 03/22/13 0840 Last data filed at 03/22/13 0620  Gross per 24 hour  Intake 566.67 ml  Output      0 ml  Net 566.67 ml   Filed Weights   03/21/13 0500 03/21/13 0514 03/22/13 0500  Weight: 118.3 kg (260 lb 12.9 oz) 118.3 kg (260 lb 12.9 oz) 114.4 kg (252 lb 3.3 oz)    Exam:   General:  A+Ox3, NAD  Cardiovascular: rrr  Respiratory: clear anterior  Abdomen: +BS, soft, NT  Musculoskeletal: mild weakness   Data Reviewed: Basic Metabolic Panel:  Recent Labs Lab 03/18/13 0845 03/18/13 1509 03/19/13 0420 03/21/13 0510  NA 137 141 141 139  K 4.1 4.2 4.7 3.8  CL 102 103 106 107  CO2 22 25 26 23   GLUCOSE 448* 416* 322* 203*  BUN 38* 36* 33* 29*  CREATININE 1.51* 1.46* 1.68* 1.71*  CALCIUM 9.7 9.7 9.4 8.5  MG  --   --  2.0  --    Liver Function Tests:  Recent Labs Lab 03/18/13 0845 03/19/13 0420  AST 11 10  ALT 9 7  ALKPHOS 115  105  BILITOT 0.2* 0.3  PROT 7.2 7.2  ALBUMIN 2.8* 2.8*   No results found for this basename: LIPASE, AMYLASE,  in the last 168 hours No results found for this basename: AMMONIA,  in the last 168 hours CBC:  Recent Labs Lab 03/18/13 0845 03/19/13 0420 03/21/13 0510  WBC 11.6* 8.2 8.2  NEUTROABS 8.5*  --   --   HGB 10.4* 10.3* 9.1*  HCT 30.8* 30.1* 27.3*  MCV 83.9 83.8 83.7  PLT 243 228 197   Cardiac Enzymes:  Recent Labs Lab 03/18/13 0845  TROPONINI <0.30   BNP (last 3 results)  Recent Labs  09/25/12 1250  PROBNP 1010.0*   CBG:  Recent Labs Lab 03/21/13 0643 03/21/13 1203 03/21/13 1618 03/21/13 2139 03/22/13 0648  GLUCAP 202* 190* 201* 197* 171*    Recent Results (from the past 240 hour(s))  URINE  CULTURE     Status: None   Collection Time    03/18/13  9:28 PM      Result Value Range Status   Specimen Description URINE, CLEAN CATCH   Final   Special Requests NONE   Final   Culture  Setup Time 03/18/2013 22:17   Final   Colony Count NO GROWTH   Final   Culture NO GROWTH   Final   Report Status 03/19/2013 FINAL   Final     Studies: No results found.  Scheduled Meds: . amLODipine  5 mg Oral Q1200  . aspirin EC  325 mg Oral Daily  . atorvastatin  40 mg Oral q1800  . carvedilol  6.25 mg Oral BID WC  . diclofenac  75 mg Oral Daily   And  . misoprostol  200 mcg Oral Daily  . ferrous sulfate  325 mg Oral Q breakfast  . gabapentin  100 mg Oral TID  . insulin aspart  0-20 Units Subcutaneous TID WC  . insulin aspart  0-5 Units Subcutaneous QHS  . insulin glargine  36 Units Subcutaneous QHS  . omega-3 acid ethyl esters  1 g Oral Daily   Continuous Infusions: . sodium chloride 50 mL/hr at 03/22/13 0620    Principal Problem:   CVA (cerebral infarction) Active Problems:   DM   HYPERTENSION   Nausea and vomiting   Chronic kidney disease, stage 3   Leukocytosis   Anemia of chronic disease   Non-ketotic hyperosmolar coma    Time spent: [place], [place] [facility] Pager [phone]. If 7PM-7AM, please contact night-coverage at [email or web address], password [facility] 03/22/2013, 8:40 AM  LOS: 4 days

## 2013-03-22 NOTE — Progress Notes (Signed)
Pt has slept most of the morning, no complaints voiced at this time.  Sitting on side of bed for lunch.

## 2013-03-23 DIAGNOSIS — E119 Type 2 diabetes mellitus without complications: Secondary | ICD-10-CM

## 2013-03-23 LAB — GLUCOSE, CAPILLARY: Glucose-Capillary: 193 mg/dL — ABNORMAL HIGH (ref 70–99)

## 2013-03-23 MED ORDER — INSULIN ASPART 100 UNIT/ML ~~LOC~~ SOLN: 0.0000 [IU] | Freq: Every day | SUBCUTANEOUS | Status: DC

## 2013-03-23 MED ORDER — OXYCODONE HCL 5 MG PO TABS: 5.0000 mg | ORAL_TABLET | ORAL | Status: DC | PRN

## 2013-03-23 MED ORDER — ATORVASTATIN CALCIUM 40 MG PO TABS: 40.0000 mg | ORAL_TABLET | Freq: Every day | ORAL | Status: DC

## 2013-03-23 MED ORDER — DICLOFENAC-MISOPROSTOL 75-0.2 MG PO TBEC: 1.0000 | DELAYED_RELEASE_TABLET | Freq: Every day | ORAL | Status: DC

## 2013-03-23 MED ORDER — INSULIN ASPART 100 UNIT/ML ~~LOC~~ SOLN: 0.0000 [IU] | Freq: Three times a day (TID) | SUBCUTANEOUS | Status: DC

## 2013-03-23 MED ORDER — INSULIN GLARGINE 100 UNIT/ML ~~LOC~~ SOLN: 38.0000 [IU] | Freq: Every day | SUBCUTANEOUS | Status: DC

## 2013-03-23 MED ORDER — ASPIRIN 325 MG PO TBEC: 325.0000 mg | DELAYED_RELEASE_TABLET | Freq: Every day | ORAL | Status: DC

## 2013-03-23 MED ORDER — AMLODIPINE BESYLATE 5 MG PO TABS: 5.0000 mg | ORAL_TABLET | Freq: Every day | ORAL | Status: DC

## 2013-03-23 NOTE — Progress Notes (Signed)
Patient d/c today to [facility], awaiting on transportation. Assessments remained unchanged at this time.

## 2013-03-23 NOTE — Clinical Social Work Note (Signed)
Clinical Social Work   Pt is ready for discharge today to [facility]. Facility has received discharge summary and is ready to admit pt. Pt and family are agreeable to discharge plan. [facility] will provide transportation. CSW is signing off as no further needs identified.   Darden Dates, MSW< LCSW [phone]

## 2013-03-23 NOTE — Progress Notes (Signed)
Report called to the facility.

## 2013-03-23 NOTE — Discharge Summary (Signed)
Physician Discharge Summary  [name] [id number] DOB: 22-Apr-1951 DOA: 03/18/2013  PCP: Philis Fendt, MD  Admit date: 03/18/2013 Discharge date: 03/23/2013  Time spent: 35 minutes  Recommendations for Outpatient Follow-up:  1. To SNF 2. Titrate lantus for optimal control  Discharge Diagnoses:  Principal Problem:   CVA (cerebral infarction) Active Problems:   DM   HYPERTENSION   Nausea and vomiting   Chronic kidney disease, stage 3   Leukocytosis   Anemia of chronic disease   Non-ketotic hyperosmolar coma   Discharge Condition: improved  Diet recommendation: diabetic/cardiac  Filed Weights   03/21/13 0500 03/21/13 0514 03/22/13 0500  Weight: 118.3 kg (260 lb 12.9 oz) 118.3 kg (260 lb 12.9 oz) 114.4 kg (252 lb 3.3 oz)    History of present illness:  Patient is a 62 year old female with past medical history significant for chronic kidney disease, chronic systolic and diastolic heart failure (2-D echo in 2011 with ejection fraction 55-60%), diabetes, hypertension who presented to [facility] ED 03/18/2013 with complaints of nausea, vomiting and dizziness started around 6:00 this morning prior to this admission. Patient reported associated headache, 7/10 in intensity, frontal, nonradiating. No blurry vision or double vision. No loss of consciousness. Patient had no complaints of slurred speech, weakness. No complaints of chest pain, shortness of breath or palpitations. No fever or chills. No complaints of abdominal pain, no diarrhea or constipation.  In ED, evaluation included CT head which was significant for remote appearing right parietal - occipital lobe infarct but no acute intracranial findings. MRI of the brain was significant for moderate to large confluent right PICA cerebellar infarct and early cytotoxic edema. In addition, her CBGs were elevated 400 range. BMET revealed slightly elevated at 13. Patient was started on insulin drip on admission. Her creatinine was elevated at  1.51 which is slightly above her baseline of 1.3.   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 1.5   Leukocytosis:  - Unlcear etiology- possible stress demargination - now resolved   Anemia of chronic disease:  - monitor Hbg.   Diabetic neuropathy:  - Continue gabapentin.   Dyslipidemia  - Continue simvastatin   HYPERTENSION  - Continue home medications, losartan   Chronic systolic and diastolic CHF  - 2 D ECHO in 2011 with ejection fraction 55-60% and repeat echo here remains stable     Procedures:  Echo: Study Conclusions  - Left ventricle: The cavity size was normal. Wall thickness was increased in a pattern of mild LVH. There was focal basal hypertrophy. Systolic function was vigorous. The estimated ejection fraction was in the range of 65% to 70%. Wall motion was normal; there were no regional wall motion abnormalities. - Left atrium: The atrium was mildly dilated. - Right atrium: The atrium was mildly dilated.    Consultations:    Discharge Exam: Filed Vitals:   03/22/13 2200 03/23/13 0200 03/23/13 0600 03/23/13 1113  BP: 154/64 148/68 153/61 185/70  Pulse: 71 67 70 82  Temp: 97.2 F (36.2 C) 97.4 F (36.3 C) 97.6 F (36.4 C) 99.9 F (37.7 C)  TempSrc: Oral Oral Oral Oral  Resp: 18 18 18 20   Height:      Weight:      SpO2: 100% 98% 100% 97%   Eating well, no headaches  today General: pleasant/cooperative Cardiovascular: rrr Respiratory: clear anterior  Discharge Instructions      Discharge Orders   Future Orders Complete By Expires     Diet - low sodium heart healthy  As directed     Diet Carb Modified  As directed     Discharge instructions  As directed     Comments:      LFTs 6 weeks Titrate insulin for optimal control Cbc,BMp in 1 week    Increase activity slowly  As directed         Medication List    STOP taking these medications       losartan-hydrochlorothiazide 50-12.5 MG per tablet  Commonly known as:  HYZAAR     metFORMIN 1000 MG tablet  Commonly known as:  GLUCOPHAGE     nitroGLYCERIN 0.4 MG SL tablet  Commonly known as:  NITROSTAT     pravastatin 80 MG tablet  Commonly known as:  PRAVACHOL     VITAMIN D3 PO      TAKE these medications       amLODipine 5 MG tablet  Commonly known as:  NORVASC  Take 1 tablet (5 mg total) by mouth daily at 12 noon.     aspirin 325 MG EC tablet  Take 1 tablet (325 mg total) by mouth daily.     atorvastatin 40 MG tablet  Commonly known as:  LIPITOR  Take 1 tablet (40 mg total) by mouth daily at 6 PM.     carvedilol 6.25 MG tablet  Commonly known as:  COREG  Take 6.25 mg by mouth 2 (two) times daily with a meal.     Diclofenac-Misoprostol 75-0.2 MG Tbec  Take 1 tablet by mouth daily.     gabapentin 100 MG capsule  Commonly known as:  NEURONTIN  Take 100 mg by mouth 3 (three) times daily.     insulin aspart 100 UNIT/ML injection  Commonly known as:  novoLOG  Inject 0-20 Units into the skin 3 (three) times daily with meals.     insulin aspart 100 UNIT/ML injection  Commonly known as:  novoLOG  Inject 0-5 Units into the skin at bedtime.     insulin glargine 100 UNIT/ML injection  Commonly known as:  LANTUS  Inject 0.38 mLs (38 Units total) into the skin at bedtime.     omega-3 acid ethyl esters 1 G capsule  Commonly known as:  LOVAZA  Take 2 g by mouth daily.      oxyCODONE 5 MG immediate release tablet  Commonly known as:  Oxy IR/ROXICODONE  Take 1-2 tablets (5-10 mg total) by mouth every 4 (four) hours as needed.       No Known Allergies Follow-up Information   Follow up with Forbes Cellar, MD. Schedule an appointment as soon as possible for a visit in 2 months. (stroke clinic)    Contact information:   [place] [id number] [phone]       Follow up with Nolene Ebbs A, MD In 1 week.   Contact information:   [address] [place] [address]  The results of significant diagnostics from this hospitalization (including imaging, microbiology, ancillary and laboratory) are listed below for reference.    Significant Diagnostic Studies: Dg Chest 2 View  03/19/2013  *RADIOLOGY REPORT*  Clinical Data: Stroke protocol.  No chest complaints.  CHEST - 2 VIEW  Comparison: 09/25/2012  Findings: Cardiac enlargement with mild pulmonary vascular congestion.  No definite edema.  No blunting of costophrenic angles.  No pneumothorax.  Mediastinal contours appear intact. Degenerative changes in the spine.  No significant change since previous study.  IMPRESSION: Cardiac enlargement with pulmonary vascular congestion.   Original Report Authenticated By: Lucienne Capers, M.D.    Ct Head Wo Contrast  03/19/2013  *RADIOLOGY REPORT*  Clinical Data: Right cerebellar infarct.  Follow-up.  CT HEAD WITHOUT CONTRAST  Technique:  Contiguous axial images were obtained from the base of the skull through the vertex without contrast.  Comparison: 03/18/2013 MR and CT.  Findings: Moderate to large sized acute right cerebellar infarct with cytotoxic edema and local mass effect including distortion of the fourth ventricle.  No obvious hemorrhage.  Subtle petechial hemorrhage difficult to completely excluded.  Presently no hydrocephalus.  Given the position of the infarct and size of the infarct, this will need to be monitored closely  to exclude hemorrhagic transformation and / or development of hydrocephalus.  Subacute to chronic right parietal lobe and right corona radiata infarcts.  Small vessel disease type changes.  No intracranial mass lesion detected on this unenhanced exam.  Vascular calcifications.  IMPRESSION: Moderate to large sized acute right cerebellar infarct with cytotoxic edema and local mass effect including distortion of the fourth ventricle.  No obvious hemorrhage.  Subtle petechial hemorrhage difficult to completely excluded.  Presently no hydrocephalus.  Given the position of the infarct and size of the infarct, this will need to be monitored closely to exclude hemorrhagic transformation and / or development of hydrocephalus.  Subacute to chronic right parietal lobe and right corona radiata infarcts.   Original Report Authenticated By: Genia Del, M.D.    Ct Head Wo Contrast  03/18/2013  *RADIOLOGY REPORT*  Clinical Data: Headache.  Diabetic hypertensive patient with hyperlipidemia.  Hyperglycemia.  CT HEAD WITHOUT CONTRAST  Technique:  Contiguous axial images were obtained from the base of the skull through the vertex without contrast.  Comparison: None.  Findings: No intracranial hemorrhage.  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  Remote tiny mid right corona radiata infarct.  Small vessel disease type changes.  No CT evidence of large acute infarct.  No intracranial mass lesion detected on this unenhanced exam.  No hydrocephalus.  Vascular calcifications.  Mastoid air cells, middle ear cavities and visualized sinuses are clear.  IMPRESSION: No intracranial hemorrhage.  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  Remote tiny mid right corona radiata infarct.  Small vessel disease type changes.  No CT evidence of large acute infarct.   Original Report Authenticated By: Genia Del, M.D.    Mr Angiogram Head Wo Contrast  03/18/2013  *RADIOLOGY REPORT*  Clinical Data: 62 year old  female with dizziness weakness hyperglycemia unable to walk confusion and vomiting.  Possible stroke.  Comparison: Head CT without contrast 03/18/2013.  MRI HEAD WITHOUT CONTRAST  Technique:  Multiplanar, multiecho pulse sequences of the brain and surrounding structures were obtained according to standard protocol without intravenous contrast.  Findings: Confluent moderate to large area of restricted diffusion in the inferior right cerebellum (right PICA territory).  Early cytotoxic edema (series 7 image 7).  No  significant posterior fossa mass effect at this time. No evidence of associated hemorrhage.  No left cerebellum or brainstem involvement.  There is right occipital lobe posterior superior encephalomalacia. Associated white matter gliosis.  However, on diffusion weighted imaging there are are some small curvilinear areas along the periphery of the encephalomalacia which appear mildly restricted (series 3 image 19).  Furthermore, there is confluent cerebral white matter T2 and FLAIR hyperintensity in both hemispheres, but a small area of subcortical white matter mildly restricted diffusion is suggested (series 3 image 21 and series 300 image 21).  This is in the posterior right MCA territory.  The distal right vertebral artery flow void cannot be identified. The distal left vertebral artery flow void is present.  Other Major intracranial vascular flow voids are preserved.  No ventriculomegaly. No acute intracranial hemorrhage identified. Negative pituitary, cervicomedullary junction and visualized cervical spine.  Deep gray matter nuclei and brain stem signal within normal limits.  Grossly normal visualized internal auditory structures. Visualized orbit soft tissues are within normal limits. Trace bilateral mastoid fluid.  Paranasal sinuses are clear. Visualized bone marrow signal is within normal limits.  Negative scalp soft tissues.  IMPRESSION: 1.  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  Early cytotoxic edema.  No associated hemorrhage or significant mass effect at this time. 2.  Subacute on chronic appearing right PCA and right MCA white matter infarcts.  Underlying chronic right occipital infarct with encephalomalacia corresponding to the earlier CT finding.  3.  See MRA findings below.  MRA HEAD WITHOUT CONTRAST  Technique:  Angiographic images of the Circle of Willis were obtained using MRA technique without intravenous contrast.  Findings:  Absent flow signal in the distal right vertebral artery which appears to be nondominant.  Right PICA origin probably on series 4 image 46 and also without flow signal.  Diminutive right AICA origin probably with preserved flow (image 65).  Preserved antegrade flow signal in a dominant appearing distal left vertebral artery.  Left PICA is patent.  The vessel now supplies the basilar which is patent without stenosis but tortuous.  SCA origins are within normal limits.  Right PCA origin is normal. Fetal type left PCA origin.  Right posterior communicating artery is diminutive or absent.  Bilateral PCA branches are within normal limits.  Antegrade flow in both ICA siphons.  Mild ICA ectasia.  Ophthalmic and left posterior communicating artery origins are within normal limits.  No ICA stenosis.  Carotid termini are patent.  The left ACA A1 segment appears to be nondominant.  Alternatively, it might be stenotic.  Right ACA is within normal limits.  Anterior communicating artery within normal limits.  The right ACA A2 segment also appears somewhat dominant.  Visualized ACA branches are within normal limits.  Both MCA origins are within normal limits.  Mild bilateral M1 segment irregularity without stenosis. Both MCA bifurcations are patent.  Visualized bilateral MCA branches are within normal limits.  IMPRESSION: 1.  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  The distal left vertebral artery likely was nondominant. 2.  Otherwise negative posterior  circulation. 3.  Mild anterior circulation atherosclerosis.  Nondominant versus atherosclerotic left ACA A1 segment.  Study discussed by telephone with Dr. Dorie Rank on 03/18/2013 at 1327 hours.   Original Report Authenticated By: Roselyn Reef, M.D.    Mr Brain Wo Contrast  03/18/2013  *RADIOLOGY REPORT*  Clinical Data: 62 year old female with dizziness weakness hyperglycemia unable to walk confusion and vomiting.  Possible  stroke.  Comparison: Head CT without contrast 03/18/2013.  MRI HEAD WITHOUT CONTRAST  Technique:  Multiplanar, multiecho pulse sequences of the brain and surrounding structures were obtained according to standard protocol without intravenous contrast.  Findings: Confluent moderate to large area of restricted diffusion in the inferior right cerebellum (right PICA territory).  Early cytotoxic edema (series 7 image 7).  No significant posterior fossa mass effect at this time. No evidence of associated hemorrhage.  No left cerebellum or brainstem involvement.  There is right occipital lobe posterior superior encephalomalacia. Associated white matter gliosis.  However, on diffusion weighted imaging there are are some small curvilinear areas along the periphery of the encephalomalacia which appear mildly restricted (series 3 image 19).  Furthermore, there is confluent cerebral white matter T2 and FLAIR hyperintensity in both hemispheres, but a small area of subcortical white matter mildly restricted diffusion is suggested (series 3 image 21 and series 300 image 21).  This is in the posterior right MCA territory.  The distal right vertebral artery flow void cannot be identified. The distal left vertebral artery flow void is present.  Other Major intracranial vascular flow voids are preserved.  No ventriculomegaly. No acute intracranial hemorrhage identified. Negative pituitary, cervicomedullary junction and visualized cervical spine.  Deep gray matter nuclei and brain stem signal within normal limits.   Grossly normal visualized internal auditory structures. Visualized orbit soft tissues are within normal limits. Trace bilateral mastoid fluid.  Paranasal sinuses are clear. Visualized bone marrow signal is within normal limits.  Negative scalp soft tissues.  IMPRESSION: 1.  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 Early cytotoxic edema.  No associated hemorrhage or significant mass effect at this time. 2.  Subacute on chronic appearing right PCA and right MCA white matter infarcts.  Underlying chronic right occipital infarct with encephalomalacia corresponding to the earlier CT finding.  3.  See MRA findings below.  MRA HEAD WITHOUT CONTRAST  Technique:  Angiographic images of the Circle of Willis were obtained using MRA technique without intravenous contrast.  Findings:  Absent flow signal in the distal right vertebral artery which appears to be nondominant.  Right PICA origin probably on series 4 image 46 and also without flow signal.  Diminutive right AICA origin probably with preserved flow (image 65).  Preserved antegrade flow signal in a dominant appearing distal left vertebral artery.  Left PICA is patent.  The vessel now supplies the basilar which is patent without stenosis but tortuous.  SCA origins are within normal limits.  Right PCA origin is normal. Fetal type left PCA origin.  Right posterior communicating artery is diminutive or absent.  Bilateral PCA branches are within normal limits.  Antegrade flow in both ICA siphons.  Mild ICA ectasia.  Ophthalmic and left posterior communicating artery origins are within normal limits.  No ICA stenosis.  Carotid termini are patent.  The left ACA A1 segment appears to be nondominant.  Alternatively, it might be stenotic.  Right ACA is within normal limits.  Anterior communicating artery within normal limits.  The right ACA A2 segment also appears somewhat dominant.  Visualized ACA branches are within normal limits.  Both MCA origins are within  normal limits.  Mild bilateral M1 segment irregularity without stenosis. Both MCA bifurcations are patent.  Visualized bilateral MCA branches are within normal limits.  IMPRESSION: 1.  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  The distal left vertebral artery likely was nondominant. 2.  Otherwise negative posterior circulation. 3.  Mild anterior  circulation atherosclerosis.  Nondominant versus atherosclerotic left ACA A1 segment.  Study discussed by telephone with Dr. Dorie Rank on 03/18/2013 at 1327 hours.   Original Report Authenticated By: Roselyn Reef, M.D.     Microbiology: Recent Results (from the past 240 hour(s))  URINE CULTURE     Status: None   Collection Time    03/18/13  9:28 PM      Result Value Range Status   Specimen Description URINE, CLEAN CATCH   Final   Special Requests NONE   Final   Culture  Setup Time 03/18/2013 22:17   Final   Colony Count NO GROWTH   Final   Culture NO GROWTH   Final   Report Status 03/19/2013 FINAL   Final     Labs: Basic Metabolic Panel:  Recent Labs Lab 03/18/13 0845 03/18/13 1509 03/19/13 0420 03/21/13 0510  NA 137 141 141 139  K 4.1 4.2 4.7 3.8  CL 102 103 106 107  CO2 22 25 26 23   GLUCOSE 448* 416* 322* 203*  BUN 38* 36* 33* 29*  CREATININE 1.51* 1.46* 1.68* 1.71*  CALCIUM 9.7 9.7 9.4 8.5  MG  --   --  2.0  --    Liver Function Tests:  Recent Labs Lab 03/18/13 0845 03/19/13 0420  AST 11 10  ALT 9 7  ALKPHOS 115 105  BILITOT 0.2* 0.3  PROT 7.2 7.2  ALBUMIN 2.8* 2.8*   No results found for this basename: LIPASE, AMYLASE,  in the last 168 hours No results found for this basename: AMMONIA,  in the last 168 hours CBC:  Recent Labs Lab 03/18/13 0845 03/19/13 0420 03/21/13 0510  WBC 11.6* 8.2 8.2  NEUTROABS 8.5*  --   --   HGB 10.4* 10.3* 9.1*  HCT 30.8* 30.1* 27.3*  MCV 83.9 83.8 83.7  PLT 243 228 197   Cardiac Enzymes:  Recent Labs Lab 03/18/13 0845  TROPONINI <0.30   BNP: BNP (last 3  results)  Recent Labs  09/25/12 1250  PROBNP 1010.0*   CBG:  Recent Labs Lab 03/22/13 0648 03/22/13 1201 03/22/13 1554 03/22/13 2114 03/23/13 0644  GLUCAP 171* 264* 174* 193* 161*       Signed:  Mika Griffitts  [facility] 03/23/2013, 11:59 AM

## 2013-03-24 ENCOUNTER — Telehealth (HOSPITAL_COMMUNITY): Payer: Self-pay | Admitting: Emergency Medicine

## 2013-03-24 NOTE — Telephone Encounter (Signed)
Facility called wrong place. She needs stroke clinic f/u. I left message providing stroke clinic number.

## 2013-03-26 ENCOUNTER — Non-Acute Institutional Stay (SKILLED_NURSING_FACILITY): Payer: Medicare Other | Admitting: Internal Medicine

## 2013-03-26 DIAGNOSIS — I639 Cerebral infarction, unspecified: Secondary | ICD-10-CM

## 2013-03-26 DIAGNOSIS — E1129 Type 2 diabetes mellitus with other diabetic kidney complication: Secondary | ICD-10-CM

## 2013-03-26 DIAGNOSIS — I6350 Cerebral infarction due to unspecified occlusion or stenosis of unspecified cerebral artery: Secondary | ICD-10-CM

## 2013-03-26 DIAGNOSIS — K5900 Constipation, unspecified: Secondary | ICD-10-CM

## 2013-03-26 DIAGNOSIS — E1165 Type 2 diabetes mellitus with hyperglycemia: Secondary | ICD-10-CM

## 2013-04-02 ENCOUNTER — Non-Acute Institutional Stay (SKILLED_NURSING_FACILITY): Payer: Medicare Other | Admitting: Internal Medicine

## 2013-04-02 DIAGNOSIS — E1129 Type 2 diabetes mellitus with other diabetic kidney complication: Secondary | ICD-10-CM

## 2013-04-02 DIAGNOSIS — D638 Anemia in other chronic diseases classified elsewhere: Secondary | ICD-10-CM

## 2013-04-02 DIAGNOSIS — E1165 Type 2 diabetes mellitus with hyperglycemia: Secondary | ICD-10-CM

## 2013-04-09 ENCOUNTER — Non-Acute Institutional Stay (SKILLED_NURSING_FACILITY): Payer: Medicare Other | Admitting: Internal Medicine

## 2013-04-09 DIAGNOSIS — I6350 Cerebral infarction due to unspecified occlusion or stenosis of unspecified cerebral artery: Secondary | ICD-10-CM

## 2013-04-09 DIAGNOSIS — E1059 Type 1 diabetes mellitus with other circulatory complications: Secondary | ICD-10-CM

## 2013-04-09 DIAGNOSIS — I639 Cerebral infarction, unspecified: Secondary | ICD-10-CM

## 2013-04-09 DIAGNOSIS — N183 Chronic kidney disease, stage 3 unspecified: Secondary | ICD-10-CM

## 2013-04-09 DIAGNOSIS — I150 Renovascular hypertension: Secondary | ICD-10-CM | POA: Insufficient documentation

## 2013-04-09 NOTE — Progress Notes (Signed)
PROGRESS NOTE  DATE: 04/09/2013   FACILITY: [facility]  LEVEL OF CARE: SNF (31)  Discharge Visit  CHIEF COMPLAINT:  Manage CVA and diabetes mellitus  HISTORY OF PRESENT ILLNESS: I was requested by the social worker to perform face-to-face evaluation for discharge:  Patient was admitted to this facility for short-term rehabilitation after the patient's recent hospitalization.  Patient has completed SNF rehabilitation and therapy has cleared the patient for discharge.  Reassessment of ongoing problem(s):  CVA: The patient's CVA remains stable.  Patient denies new neurologic symptoms such as numbness, tingling, weakness, speech difficulties or visual disturbances.  No complications reported from the medications currently being used.  DM:pt's DM remains stable.  Pt denies polyuria, polydipsia, polyphagia, changes in vision or hypoglycemic episodes.  No complications noted from the medication presently being used.  Last hemoglobin A1c is not available.  PAST MEDICAL HISTORY : Reviewed.  No changes.  CURRENT MEDICATIONS: Reviewed per [facility]  REVIEW OF SYSTEMS:  GENERAL: no change in appetite, no fatigue, no weight changes, no fever, chills or weakness RESPIRATORY: no cough, SOB, DOE, wheezing, hemoptysis CARDIAC: no chest pain, complains of edema, no palpitations GI: no abdominal pain, diarrhea, constipation, heart burn, nausea or vomiting  PHYSICAL EXAMINATION  VS:  T 97.9       P 74       RR 20      BP 154/74      POX %       WT (Lb)  242.6  GENERAL: no acute distress, obese body habitus EYES: conjunctivae normal, sclerae normal, normal eye lids NECK: supple, trachea midline, no neck masses, no thyroid tenderness, no thyromegaly LYMPHATICS: no LAN in the neck, no supraclavicular LAN RESPIRATORY: breathing is even & unlabored, BS CTAB CARDIAC: RRR, no murmur,no extra heart sounds, +3 bilateral lower extremity edema GI: abdomen soft, normal BS, no  masses, no tenderness, no hepatomegaly, no splenomegaly PSYCHIATRIC: the patient is alert & oriented to person, affect & behavior appropriate  LABS/RADIOLOGY:  5/14 hemoglobin 9.7, MCV 85 otherwise CBC normal, glucose 350, creatinine 1.77 otherwise BMP normal  ASSESSMENT/PLAN:  1. CVA-continue home rehabilitation. 2. diabetes mellitus with vascular complications-continue current medications. A glucometer prescription provided. 3. chronic kidney disease stage III-stable. 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 Followup with primary M.D. 5. anemia of chronic kidney disease-stable.  I have filled out patient's discharge paperwork and written prescriptions.  Patient will receive home health PT, OT,  Nursing. DME provided: Rolling walker (781.3, 438.9, 728.87)  Total discharge time: Greater than 30 minutes Discharge time involved coordination of the discharge process with Education officer, museum, nursing staff and therapy department. Medical justification for home health services/DME verified.  CPT CODE: 09811

## 2013-04-16 DIAGNOSIS — E1129 Type 2 diabetes mellitus with other diabetic kidney complication: Secondary | ICD-10-CM | POA: Insufficient documentation

## 2013-04-16 DIAGNOSIS — K5900 Constipation, unspecified: Secondary | ICD-10-CM | POA: Insufficient documentation

## 2013-04-16 NOTE — Progress Notes (Signed)
Patient ID: [name], female   DOB: [birth year], 62 y.o.   MRN: [id number]        HISTORY & PHYSICAL  DATE: 03/26/2013   FACILITY: [facility]  LEVEL OF CARE: SNF (31)  ALLERGIES:  No Known Allergies  CHIEF COMPLAINT:  Manage CVA, diabetes mellitus, and chronic kidney disease stage III.    HISTORY OF PRESENT ILLNESS:  The patient is a 62 year-old, African-American female.    CVA: The patient presented to the ER with nausea, vomiting and dizziness, as well as a headache.  CT of the head showed a remote-appearing right parietal-occipital lobe infarct, but no acute findings.  MRI of the brain was significant for moderate to large confluent right PICA cerebellar infarct and toxic edema.  The patient is on a statin and aspirin.  LDL was above goal level.  Carotid doppler and 2D-echo were unrevealing for a source of CVA.  The patient's CVA remains stable.  Patient denies new neurologic symptoms such as numbness, tingling, weakness, speech difficulties or visual disturbances.  No complications reported from the medications currently being used.  The patient is admitted to this facility for short-term rehabilitation.    [id number] were uncontrolled.  Hemoglobin A1C was 13.8.  Her Lantus was increased.  pt's DM remains stable.  Pt denies polyuria, polydipsia, polyphagia, changes in vision or hypoglycemic episodes.  No complications noted from the medication presently being used.  Last hemoglobin A1c is: 13.8.   CHRONIC KIDNEY DISEASE: At admission, creatinine was 1.51.  Baseline creatinine is 1.5.  The patient's chronic kidney disease remains stable.  Patient denies increasing lower extremity swelling or confusion. Last BUN and creatinine are:     PAST MEDICAL HISTORY :  Past Medical History  Diagnosis Date  . Hypertension   . Diabetes mellitus   . Hyperlipemia   . Cancer     endo ca    PAST SURGICAL HISTORY: Past Surgical History  Procedure Laterality Date  .  Abdominal hysterectomy    . Leg tendon surgery      patient fell in a store right leg  . Leg surgery      hole in bone( during Childhood)    SOCIAL HISTORY:  reports that she has never smoked. She has never used smokeless tobacco. She reports that she does not drink alcohol or use illicit drugs.  FAMILY HISTORY: None  CURRENT MEDICATIONS: Reviewed per MAR  REVIEW OF SYSTEMS:  See HPI otherwise 14 point ROS is negative.  PHYSICAL EXAMINATION  VS:  T 99      P 76       RR 18       BP 172/82      POX%        WT (Lb) 230  GENERAL: no acute distress, morbidly obese body habitus EYES: conjunctivae normal, sclerae normal, normal eye lids MOUTH/THROAT: lips without lesions,no lesions in the mouth,tongue is without lesions,uvula elevates in midline NECK: supple, trachea midline, no neck masses, no thyroid tenderness, no thyromegaly LYMPHATICS: no LAN in the neck, no supraclavicular LAN RESPIRATORY: breathing is even & unlabored, BS CTAB CARDIAC: RRR, no murmur,no extra heart sounds EDEMA/VARICOSITIES:  +1 bilateral lower extremity edema  ARTERIAL:  pedal pulses +1  GI:  ABDOMEN: abdomen soft, normal BS, no masses, no tenderness  LIVER/SPLEEN: no hepatomegaly, no splenomegaly MUSCULOSKELETAL: HEAD: normal to inspection & palpation BACK: no kyphosis, scoliosis or spinal processes tenderness EXTREMITIES: LEFT UPPER EXTREMITY: full range of motion, normal strength &  tone RIGHT UPPER EXTREMITY:  full range of motion, normal strength & tone LEFT LOWER EXTREMITY: strength decreased, range of motion moderate  RIGHT LOWER EXTREMITY: strength decreased, range of motion moderate  PSYCHIATRIC: the patient is alert & oriented to person, affect & behavior appropriate  LABS/RADIOLOGY: Urine culture showed no growth.   Glucose 203, creatinine 1.71, otherwise BMP normal.    Magnesium 2.   Liver profile normal except albumin 2.8.     Hemoglobin 9.1, MCV 83.7, otherwise CBC normal.     Troponin-I less than 0.03.   Pro-BNP 1,010.    2D-echo showed EF of 65-70%, normal wall motion.    Triglycerides 282, HDL 36, LDL 95, total cholesterol 187.  ASSESSMENT/PLAN:  Large right-sided PICA infarct.  Continue aspirin and statin and rehabilitation.    Diabetes mellitus with renal complications.  Uncontrolled.  Lantus was increased.    Chronic kidney disease stage III.  Reassess.   Constipation.  New problem.  Staff report ongoing constipation.  Start MiraLAX 17 g q.d.    Renovascular hypertension.  Blood pressure elevated.  We will review a BP log.   Dyslipidemia.  Continue simvastatin.    Anemia of chronic disease.  Reassess hemoglobin level.   Diabetic neuropathy.  Continue gabapentin.    Check CBC and BMP.   I have reviewed patient's medical records received at admission/from hospitalization.  CPT CODE: 10272

## 2013-04-20 ENCOUNTER — Other Ambulatory Visit (HOSPITAL_COMMUNITY): Payer: Self-pay | Admitting: Family Medicine

## 2013-04-20 DIAGNOSIS — I96 Gangrene, not elsewhere classified: Secondary | ICD-10-CM

## 2013-04-21 ENCOUNTER — Ambulatory Visit (HOSPITAL_COMMUNITY)
Admission: RE | Admit: 2013-04-21 | Discharge: 2013-04-21 | Disposition: A | Discharge status: Home / Self Care | Payer: Medicare Other | Source: Ambulatory Visit | Attending: Family Medicine | Admitting: Family Medicine

## 2013-04-21 DIAGNOSIS — E1159 Type 2 diabetes mellitus with other circulatory complications: Secondary | ICD-10-CM | POA: Insufficient documentation

## 2013-04-21 DIAGNOSIS — I96 Gangrene, not elsewhere classified: Secondary | ICD-10-CM

## 2013-04-21 DIAGNOSIS — I739 Peripheral vascular disease, unspecified: Secondary | ICD-10-CM

## 2013-04-21 DIAGNOSIS — I10 Essential (primary) hypertension: Secondary | ICD-10-CM | POA: Insufficient documentation

## 2013-04-21 NOTE — Progress Notes (Signed)
VASCULAR LAB PRELIMINARY  ARTERIAL  ABI completed:    RIGHT    LEFT    PRESSURE WAVEFORM  PRESSURE WAVEFORM  BRACHIAL 194 Triphasic BRACHIAL 199 Triphasic  DP   DP    AT 243 Triphasic AT 139 Dampened monophasic  PT 171 Triphasic PT 45 Dampened monophasic  PER   PER    GREAT TOE 192 NA GREAT TOE 60 NA    RIGHT LEFT  ABI >1.0 0.7     Mikel Hardgrove, RVT 04/21/2013, 11:10 AM

## 2013-04-23 NOTE — Progress Notes (Signed)
Patient ID: [name], female   DOB: 12/22/1950, 62 y.o.   MRN: [id number]        PROGRESS NOTE  DATE: 04/02/2013  FACILITY:  [facility]  LEVEL OF CARE: SNF (31)  Acute Visit  CHIEF COMPLAINT:  Manage diabetes mellitus, chronic kidney disease stage III, and anemia of chronic kidney disease.    HISTORY OF PRESENT ILLNESS: I was requested by the staff to assess the patient regarding above problem(s):  DM: Staff report that patient's CBGs are running very high.  On review of numbers, she is ranging from 200 to 400.  pt's DM remains unstable.  Pt denies polyuria, polydipsia, polyphagia, changes in vision or hypoglycemic episodes.  No complications noted from the medication presently being used.  Last hemoglobin A1c is: A recent  hemoglobin A1C is not available.   CHRONIC KIDNEY DISEASE: The patient has a history of chronic kidney disease stage III.  The patient's chronic kidney disease remains stable.  Patient denies increasing lower extremity swelling or confusion. Last BUN and creatinine are:  On 04/01/2013, creatinine was 1.77.  On 03/21/2013, creatinine was 1.71.  ANEMIA: The anemia has been stable. The patient denies fatigue, melena or hematochezia. The patient is  currently not on iron.  On 04/01/2013, hemoglobin was 9.7, MCV  85.1.  On 03/21/2013, hemoglobin was 9.1.  PAST MEDICAL HISTORY : Reviewed.  No changes.  CURRENT MEDICATIONS: Reviewed per [facility]  REVIEW OF SYSTEMS:  GENERAL: no change in appetite, no fatigue, no weight changes, no fever, chills or weakness RESPIRATORY: no cough, SOB, DOE,, wheezing, hemoptysis CARDIAC: no chest pain or palpitations; complains of lower extremity swelling  GI: no abdominal pain, diarrhea, constipation, heart burn, nausea or vomiting  PHYSICAL EXAMINATION  VS:  T 98.2      P 72      RR 18      BP 128/65     POX %       WT (Lb)  GENERAL: no acute distress, morbidly obese body habitus EYES: conjunctivae normal,  sclerae normal, normal eye lids NECK: supple, trachea midline, no neck masses, no thyroid tenderness, no thyromegaly LYMPHATICS: no LAN in the neck, no supraclavicular LAN RESPIRATORY: breathing is even & unlabored, BS CTAB CARDIAC: RRR, no murmur,no extra heart sounds EDEMA/VARICOSITIES:  +2 bilateral lower extremity edema  ARTERIAL:  pedal pulses nonpalpable  GI: abdomen soft, normal BS, no masses, no tenderness, no hepatomegaly, no splenomegaly PSYCHIATRIC: the patient is alert & oriented to person, affect & behavior appropriate  ASSESSMENT/PLAN:  Diabetes mellitus with renal complications.  Uncontrolled problem.  Request diabetic diet.  Increase Lantus to 48 U q.h.s.  Check  hemoglobin A1C.    Chronic kidney disease stage III.  Liver functions stable.    Anemia of chronic kidney disease.  Hemoglobin improved.    CPT CODE: 16109

## 2013-04-28 ENCOUNTER — Encounter: Payer: Self-pay | Admitting: Cardiology

## 2013-04-28 ENCOUNTER — Encounter (HOSPITAL_COMMUNITY): Payer: Self-pay | Admitting: Cardiology

## 2013-04-28 ENCOUNTER — Ambulatory Visit (INDEPENDENT_AMBULATORY_CARE_PROVIDER_SITE_OTHER): Payer: Medicare Other | Admitting: Cardiology

## 2013-04-28 VITALS — BP 150/70 | HR 77 | Ht 62.0 in | Wt 235.4 lb

## 2013-04-28 DIAGNOSIS — E1159 Type 2 diabetes mellitus with other circulatory complications: Secondary | ICD-10-CM

## 2013-04-28 DIAGNOSIS — E1129 Type 2 diabetes mellitus with other diabetic kidney complication: Secondary | ICD-10-CM

## 2013-04-28 DIAGNOSIS — I739 Peripheral vascular disease, unspecified: Secondary | ICD-10-CM

## 2013-04-28 DIAGNOSIS — I96 Gangrene, not elsewhere classified: Secondary | ICD-10-CM

## 2013-04-28 DIAGNOSIS — I6350 Cerebral infarction due to unspecified occlusion or stenosis of unspecified cerebral artery: Secondary | ICD-10-CM

## 2013-04-28 DIAGNOSIS — I2510 Atherosclerotic heart disease of native coronary artery without angina pectoris: Secondary | ICD-10-CM

## 2013-04-28 DIAGNOSIS — E1169 Type 2 diabetes mellitus with other specified complication: Secondary | ICD-10-CM

## 2013-04-28 DIAGNOSIS — L97509 Non-pressure chronic ulcer of other part of unspecified foot with unspecified severity: Secondary | ICD-10-CM

## 2013-04-28 DIAGNOSIS — E1165 Type 2 diabetes mellitus with hyperglycemia: Secondary | ICD-10-CM

## 2013-04-28 DIAGNOSIS — I10 Essential (primary) hypertension: Secondary | ICD-10-CM

## 2013-04-28 DIAGNOSIS — E785 Hyperlipidemia, unspecified: Secondary | ICD-10-CM

## 2013-04-28 DIAGNOSIS — L988 Other specified disorders of the skin and subcutaneous tissue: Secondary | ICD-10-CM

## 2013-04-28 DIAGNOSIS — I639 Cerebral infarction, unspecified: Secondary | ICD-10-CM

## 2013-04-28 DIAGNOSIS — E11621 Type 2 diabetes mellitus with foot ulcer: Secondary | ICD-10-CM

## 2013-04-28 NOTE — Patient Instructions (Addendum)
Your physician recommends that you schedule a follow-up appointment in 05/06/2013  Your physician has requested that you have a lower extremity arterial duplex. This test is an ultrasound of the arteries in the legs. It looks at arterial blood flow in the legs. Allow one hour for Lower  Arterial scans. There are no restrictions or special instructions  Your physician has recommended you make the following change in your medication start Augmentin twice a day until the bottle is empty.

## 2013-04-28 NOTE — Progress Notes (Signed)
Patient ID: [name], female   DOB: [birth year], 62 y.o.   MRN: [id number]  Clinic Note: HPI: [name] is a 62 y.o. female with a PMH below who presents today for for evaluation of the left second toe ulcer the with necrosis.  Interval History: So, the patient has a history of inferior STEMI that he doesn't 10, but she did not come to cardiology followup do to financial concerns according to the daughter. She is a pretty extensive history as delineated below including coronary disease, diabetes, history of stroke to his recent. As well as obesity hypertension hyperlipidemia. She is here is at couple weeks ago she had a blister on her left second toe that she popped and manipulated. Says that it really does hasn't been healing very well as can, cratered ulcer now with discoloration all over base of the toes into the metatarsal area. She denies any fever or chills or sweats. Nothing to suggest any systemic response to this infection. She says that she was given some supplies and instructions for how to dress the bone, same as she had a wound care nurse show her to do. Unfortunately she did not get her supplies filled and now is not really been appropriately dressing the wound. It does not hurt her back did not hurt her when she popped the blister and expressed fluid. This is a percent or happened to her.  She really isn't that mobile doesn't get along very well most of his and knee pains. Such heart: He was to have claudication or not. She clearly is osteophytic pains from her morbid obesity. As part of her evaluation by her primary physician she had lotion he ABIs checked which suggested moderate disease in left lower leg which correlates to the extremity were her wound is. Unfortunately, we don't have any more details from the read then moderately reduced flow.    At baseline she is relatively sedentary. His heart well. She does notice that with exertion she gets around but is easily didn't  obesity. She is occasionally gets dizzy when she stands up the morning. She denies any chest pain or shortness of breath with rest no chest pain with exertion. No PND, orthopnea. But the daughter says this he sleeps in a chair some nights appearance of the shortness pain. She denies any syncope or near syncope. No significant palpitations or lightheadedness. No TIA or amaurosis fugax symptoms since her stroke that was on April 30 this year. Chest again back a lot of her function. His medicines he is able to balance issues in the right mid some mild weakness in right leg. She denies any melena, hematochezia or hematuria.  Past Medical History  Diagnosis Date  . ST elevation myocardial infarction (STEMI) of inferior wall  03/2009    Ostial/proximal RCA occlusion treated with BMS stent  . CAD (coronary artery disease), native coronary artery      status post PCI to the RCA for inferior STEMI  . Cancer     endo ca  . Hypertension associated with diabetes   . Hyperlipidemia LDL goal <70   . Diabetes mellitus, type II, insulin dependent     With complications - CAD, CVA, peripheral ulcer ,  . Stroke, acute, within 8 weeks  4/30/ 2014    Right-sided weakness, mostly with balance issues and only mild weakness.  . Diabetic peripheral neuropathy associated with type 2 diabetes mellitus   . Obesity, Class III, BMI 40-49.9 (morbid obesity)  BMI 43; 5' 2',  235 pounds 6.4 ounces  . CKD (chronic kidney disease), stage III     Prior Cardiac Evaluation and Past Surgical History: Past Surgical History  Procedure Laterality Date  . Abdominal hysterectomy    . Leg tendon surgery      patient fell in a store right leg  . Leg surgery      hole in bone( during Childhood)  . Cardiac catheterization  03/31/2009  . Coronary angioplasty  03/31/2009    PTCA ,stent 3.5 mm x 24 mm ostium of RCA ,EF 55%   No Known Allergies  Current Outpatient Prescriptions  Medication Sig Dispense Refill  . amLODipine  (NORVASC) 5 MG tablet Take 1 tablet (5 mg total) by mouth daily at 12 noon.      Marland Kitchen atorvastatin (LIPITOR) 40 MG tablet Take 1 tablet (40 mg total) by mouth daily at 6 PM.      . carvedilol (COREG) 6.25 MG tablet Take 6.25 mg by mouth 2 (two) times daily with a meal.      . gabapentin (NEURONTIN) 100 MG capsule Take 100 mg by mouth 3 (three) times daily.      . insulin glargine (LANTUS) 100 UNIT/ML injection Inject 40 Units into the skin at bedtime.      Marland Kitchen omega-3 acid ethyl esters (LOVAZA) 1 G capsule Take 2 g by mouth daily.      Marland Kitchen aspirin EC 325 MG EC tablet Take 1 tablet (325 mg total) by mouth daily.  30 tablet  0  . Diclofenac-Misoprostol 75-0.2 MG TBEC Take 1 tablet by mouth daily.  30 tablet  0   No current facility-administered medications for this visit.    History   Social History  . Marital Status: Widowed    Spouse Name: N/A    Number of Children: N/A  . Years of Education: N/A   Occupational History  . Not on file.   Social History Main Topics  . Smoking status: Former Smoker    Quit date: 04/29/1995  . Smokeless tobacco: Never Used  . Alcohol Use: No  . Drug Use: No  . Sexually Active: No   Other Topics Concern  . Not on file   Social History Narrative   Was previously living in an assisted living center while recovering from her stroke. Lesion is now recently moved back home with her family.    She is attended by her daughter, who is super morbidly obese.   ROS: A comprehensive Review of Systems - Negative except Pertinent positives noted above. History obtained from as much with her daughter as the patient. General ROS: positive for  - sleep disturbance negative for - chills, fatigue, fever, hot flashes or night sweats ENT ROS: positive for - nasal congestion and Poor dentition negative for - epistaxis, headaches or visual changes Musculoskeletal ROS: positive for - Arthritic pains in her means mostly, as well as hips. Neurological ROS: positive for -  dizziness, gait disturbance, impaired coordination/balance, numbness/tingling, weakness and Recent stroke. With improved symptoms. She does note intermittent numbness and tingling in her feet consistent with mild neuropathy. negative for - behavioral changes or headaches  PHYSICAL EXAM BP 150/70  Pulse 77  Ht 5\' 2"  (1.575 m)  Wt 235 lb 6.4 oz (106.777 kg)  BMI 43.04 kg/m2 General appearance: alert, cooperative, appears stated age, no distress, morbidly obese and Pleasant. Very poor historian. She is alert and oriented x3, but does not answers questions very clearly her very  succinctly. Neck: no carotid bruit, no JVD, supple, symmetrical, trachea midline and thyroid not enlarged, symmetric, no tenderness/mass/nodules Lungs: clear to auscultation bilaterally, normal percussion bilaterally and Nonlabored,  no W./R./R. good air movement. His distant sounds Heart: regular rate and rhythm, S1, S2 normal, S4 present, no click, no rub and No murmur; in general her heart sounds are very distant due to body habitus. Abdomen: normal findings: aorta normal, bowel sounds normal, no bruits heard and Unable to palpate any hepatomegaly due to body habitus. and abnormal findings:  obese and Morbidly obese Extremities: edema Trace and The left foot on middle segment, there is a dime-sized ulcer that has mild granular tissue. There is no pus. It does not want to touch however there is dark discoloration involving the base of the toes going from the second to fourth toe on left foot. Pulses: mostly due to body habitus, I was unable to palpate a popliteal pulse on the left. Skin: See description above. Neurologic: Cranial nerves: normal Motor: Mildly diminished strength in the right lower extremity, as well as mild reduced strength in right hand grip. HEENT: [place]/AT, EOMI, MMM. Edentulous with maybe one tooth.  [id number] today: Yes Rate: 72 , Rhythm:  normal sinus;   LVH with inferior ST  flattening.  ASSESSMENT: 62 year old, morbidly obese woman with multiple cardiac risk factors and history of inferior STEMI as well as stroke whose referred to me for a left toe wound. This is concerning for possible gangrenous foot. When evaluated with lower extremity arterial Dopplers to better delineate the extent of disease that was suggested by ABIs. This will determine whether or not she is referred for peripheral angiography. He does have mild claudication-type symptoms but is difficult to tell fistula: Claudication.  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   Followup: 6/18/ 2014  Leonie Man, M.D., M.S. [facility] [address] [place], [place]  [address]  [phone] Pager # [phone] 05/01/2013 12:29 AM

## 2013-04-30 ENCOUNTER — Telehealth: Payer: Self-pay | Admitting: Cardiology

## 2013-04-30 ENCOUNTER — Encounter: Payer: Self-pay | Admitting: *Deleted

## 2013-04-30 NOTE — Telephone Encounter (Signed)
Per Velva Harman, Patients appt needs to be rescheduled. The tech that is coming in cannot do that particular test. Pt would need to be seen before her appt with the doctor on Wednesday.

## 2013-05-01 ENCOUNTER — Encounter: Payer: Self-pay | Admitting: Cardiology

## 2013-05-01 ENCOUNTER — Inpatient Hospital Stay (HOSPITAL_COMMUNITY): Admission: RE | Admit: 2013-05-01 | Payer: Medicare Other | Source: Ambulatory Visit

## 2013-05-01 ENCOUNTER — Telehealth (HOSPITAL_COMMUNITY): Payer: Self-pay | Admitting: Cardiovascular Disease

## 2013-05-01 DIAGNOSIS — I10 Essential (primary) hypertension: Secondary | ICD-10-CM | POA: Insufficient documentation

## 2013-05-01 DIAGNOSIS — E785 Hyperlipidemia, unspecified: Secondary | ICD-10-CM | POA: Insufficient documentation

## 2013-05-01 DIAGNOSIS — I739 Peripheral vascular disease, unspecified: Secondary | ICD-10-CM | POA: Insufficient documentation

## 2013-05-01 DIAGNOSIS — E11621 Type 2 diabetes mellitus with foot ulcer: Secondary | ICD-10-CM | POA: Insufficient documentation

## 2013-05-01 MED ORDER — AMOXICILLIN-POT CLAVULANATE 875-125 MG PO TABS: 1.0000 | ORAL_TABLET | Freq: Two times a day (BID) | ORAL | Status: DC

## 2013-05-01 NOTE — Assessment & Plan Note (Signed)
The total honestly does not seem to be acutely infected. There is some concern of possible gangrenous in onset. There is definite discoloration around it which was suggestive there has been some inflammation about the infection. She has several reasons for poor circulation including diabetes, high risk for peripheral artery disease as evidenced by her lower extremity Dopplers confirming its, as well as obesity with possible venous stasis complications as well. She does he have any heart failure symptoms at all that would lead to edema making this worse.  I will then treat with Augmentin for 10 days just to make sure it could anaerobic skin coverage. Rinne repeat Dopplers at this time with arterial Dopplers to look actual the at the flow velocities to determine extent of stenosis. This would determine whether or not we referred her to Dr. Quay Burow for peripheral angiography and possible intervention if need be. Also to see her back after the Dopplers are done in order to reassess the wound, and the results the Dopplers. Will assist with dressing today. I would recommend that she would be best served seeing a podiatrist her wound care specialist ensure that her dressings are correctly done and that she is at the appropriate equipment.

## 2013-05-01 NOTE — Assessment & Plan Note (Signed)
I did spend quite a time for her to her about the importance of weight loss. I think distally to the mid followup down the road once the foot issue is resolved.

## 2013-05-01 NOTE — Telephone Encounter (Signed)
Pt needs to have her LEA rescheduled before her appt on Wednesday with JB. She was just discharged from a rehab facility. The facility will try to contact her daughters to have them call and schedule the appt.

## 2013-05-01 NOTE — Assessment & Plan Note (Signed)
Monitored by her primary physician. She is on standing glargine insulin, but does not have listed any mealtime coverage with either oral agent or short acting insulin.

## 2013-05-01 NOTE — Assessment & Plan Note (Signed)
Cc of recovered relatively well from this event with only mild weakness and discomfort balance with decreased sensation the right side. Not clear what the etiology of her stroke was.

## 2013-05-01 NOTE — Assessment & Plan Note (Signed)
She is deathly still hypertensive today. Not sure if this has to do with inflammation or what they're this is anywhere near her baseline. Not to make any adjustments currently just be the donor very well. We'll see what it is and she comes back in followup. May need to make adjustments at that time.

## 2013-05-01 NOTE — Assessment & Plan Note (Signed)
No active anginal symptoms. She will he has not had any cardiac followup since her MI. She is on a statin, beta blocker as well as amlodipine. She's supposed to starting aspirin now for stroke. She does not ever recall being on clopidogrel. I think the main reason why she didn't go to see a cardiologist after her MI, was that she was concerned about financial implications of how expensive will be to see the cardiologist. Thankfully, she did not have any further complications.

## 2013-05-01 NOTE — Assessment & Plan Note (Signed)
She is on a statin. I don't known the last time her labs are checked. This can be followed up once the toe lesion is adequately evaluated and treated.

## 2013-05-04 ENCOUNTER — Encounter (HOSPITAL_COMMUNITY): Payer: Medicare Other

## 2013-05-06 ENCOUNTER — Ambulatory Visit (INDEPENDENT_AMBULATORY_CARE_PROVIDER_SITE_OTHER): Payer: Medicare Other | Admitting: Cardiology

## 2013-05-06 ENCOUNTER — Encounter: Payer: Self-pay | Admitting: Cardiology

## 2013-05-06 VITALS — BP 140/70 | HR 78 | Ht 62.0 in | Wt 236.1 lb

## 2013-05-06 DIAGNOSIS — L97509 Non-pressure chronic ulcer of other part of unspecified foot with unspecified severity: Secondary | ICD-10-CM

## 2013-05-06 DIAGNOSIS — E11621 Type 2 diabetes mellitus with foot ulcer: Secondary | ICD-10-CM

## 2013-05-06 DIAGNOSIS — I2510 Atherosclerotic heart disease of native coronary artery without angina pectoris: Secondary | ICD-10-CM

## 2013-05-06 DIAGNOSIS — E1169 Type 2 diabetes mellitus with other specified complication: Secondary | ICD-10-CM

## 2013-05-06 DIAGNOSIS — I739 Peripheral vascular disease, unspecified: Secondary | ICD-10-CM

## 2013-05-06 NOTE — Progress Notes (Signed)
Patient ID: [name], female   DOB: 05/05/1951, 61 y.o.   MRN: [id number]  Clinic Note: HPI: [name] is a 62 y.o. female with a PMH below who presents today for followup evaluation of the left second toe ulcer the with necrosis.  Unfortunately the patient was supposed to come to our to the [facility] to locate it with our clinic for her vascular ultrasound, which is a more complete study then was done prior to her initial visit. She apparently went to some other facility he had some studies done, we don't have the results of that. She also was supposed to be on antibiotics, but did not get deprecation refilled. She now comes back in today with no fevers or chills the discoloration around her foot is actually improving was before, but continues to be healing slowly. She denies any fever or chills or sweats. Nothing to suggest any systemic response to this infection. She says continues to not have adequate supplies to dress this wound.Marland Kitchen Unfortunately she did not get her supplies filled and now is not really been appropriately dressing the wound. It does not hurt her back did not hurt her when she popped the blister and expressed fluid. T  She is otherwise stable cardiac standpoint. She does continue to note mild dyspnea on exertion mostly due to sedentary life, obesity and upright pains. Otherwise she denies any chest pain with exertion, she denies any resting shortness of breath. No syncope or near-syncope. No lightheadedness, dizziness or wooziness other than occasionally in the morning when she stands up. No TIA or amaurosis fugax symptoms (since her stroke in April 2014).  Past Medical History  Diagnosis Date  . ST elevation myocardial infarction (STEMI) of inferior wall  03/2009    Ostial/proximal RCA occlusion treated with BMS stent  . CAD (coronary artery disease), native coronary artery      status post PCI to the RCA for inferior STEMI  . Cancer     endo ca    . Hypertension associated with diabetes   . Hyperlipidemia LDL goal <70   . Diabetes mellitus, type II, insulin dependent     With complications - CAD, CVA, peripheral ulcer ,  . Stroke, acute, within 8 weeks  4/30/ 2014    Right-sided weakness, mostly with balance issues and only mild weakness.  . Diabetic peripheral neuropathy associated with type 2 diabetes mellitus   . Obesity, Class III, BMI 40-49.9 (morbid obesity)     BMI 43; 5' 2',  235 pounds 6.4 ounces  . CKD (chronic kidney disease), stage III     Prior Cardiac Evaluation and Past Surgical History: Past Surgical History  Procedure Laterality Date  . Abdominal hysterectomy    . Leg tendon surgery      patient fell in a store right leg  . Leg surgery      hole in bone( during Childhood)  . Cardiac catheterization  03/31/2009  . Coronary angioplasty  03/31/2009    PTCA , bare-metal stent 3.5 mm x 24 mm ostium of RCA ,EF 55%   No Known Allergies  Current Outpatient Prescriptions  Medication Sig Dispense Refill  . amLODipine (NORVASC) 5 MG tablet Take 1 tablet (5 mg total) by mouth daily at 12 noon.      Marland Kitchen aspirin EC 325 MG EC tablet Take 1 tablet (325 mg total) by mouth daily.  30 tablet  0  . atorvastatin (LIPITOR) 40 MG tablet Take 1 tablet (40 mg  total) by mouth daily at 6 PM.      . carvedilol (COREG) 12.5 MG tablet Take 12.5 mg by mouth 2 (two) times daily with a meal.      . gabapentin (NEURONTIN) 100 MG capsule Take 100 mg by mouth 3 (three) times daily.      . insulin glargine (LANTUS) 100 UNIT/ML injection Inject 40 Units into the skin at bedtime.      Marland Kitchen losartan-hydrochlorothiazide (HYZAAR) 100-25 MG per tablet Take 1 tablet by mouth daily.      Marland Kitchen omega-3 acid ethyl esters (LOVAZA) 1 G capsule Take 2 g by mouth daily.      Marland Kitchen amoxicillin-clavulanate (AUGMENTIN) 875-125 MG per tablet Take 1 tablet by mouth 2 (two) times daily.  20 tablet  0   No current facility-administered medications for this visit.     History   Social History  . Marital Status: Widowed    Spouse Name: N/A    Number of Children: N/A  . Years of Education: N/A   Occupational History  . Not on file.   Social History Main Topics  . Smoking status: Former Smoker    Quit date: 04/29/1995  . Smokeless tobacco: Never Used  . Alcohol Use: No  . Drug Use: No  . Sexually Active: No   Other Topics Concern  . Not on file   Social History Narrative   Was previously living in an assisted living center while recovering from her stroke. Lesion is now recently moved back home with her family.    She is attended by her daughter, who is super morbidly obese.   ROS: A comprehensive Review of Systems - Negative except Pertinent positives noted above. History obtained from Her today, which is more difficult, as she is not very forthcoming General ROS: positive for  - sleep disturbance negative for - chills, fatigue, fever, hot flashes or night sweats Musculoskeletal ROS: positive for - Arthritic pains in her means mostly, as well as hips. Neurological ROS: positive for - dizziness, gait disturbance, impaired coordination/balance, numbness/tingling, weakness and Recent stroke. With improved symptoms. Intermittent numbness and tingling in her feet consistent with mild neuropathy. negative for - behavioral changes or headaches  PHYSICAL EXAM BP 140/70  Pulse 78  Ht 5\' 2"  (1.575 m)  Wt 236 lb 1.6 oz (107.094 kg)  BMI 43.17 kg/m2 General appearance: alert, cooperative, appears stated age, no distress, morbidly obese and Pleasant. Very poor historian. She is alert and oriented x3, but does not answers questions very clearly her very succinctly. Neck: no adenopathy, no carotid bruit, no JVD, supple, symmetrical, trachea midline and thyroid not enlarged, symmetric, no tenderness/mass/nodules Lungs: clear to auscultation bilaterally, normal percussion bilaterally and Nonlabored,  no W./R./R. good air movement. His distant  sounds Heart: regular rate and rhythm, S1, S2 normal, S4 present, no click, no rub and No murmur; in general her heart sounds are very distant due to body habitus. Abdomen: normal findings: bowel sounds normal, liver span normal to percussion, no bruits heard, spleen non-palpable and Unable to palpate any hepatomegaly due to body habitus. and abnormal findings:  obese and Morbidly obese Extremities: edema Trace and The left foot on middle segment, there is a dime-sized ulcer that has mild granular tissue. There is no pus. It does not want to touch however there is dark discoloration involving the base of the toes going from the second to fourth toe on left foot. Pulses: mostly due to body habitus,  unable to palpate a  popliteal pulse on the left. But she did have faint palpable pulses on the left dorsalis pedis pulse Skin: See description above. Slightly improved from last week.  [id number] today: No.  ASSESSMENT: 62 year old, morbidly obese woman with multiple cardiac risk factors and history of inferior STEMI as well as stroke whose referred to me for a left toe wound. This is concerning for possible gangrenous foot.  Unfortunately, I am did not have any more information today than before. When he get the results of whatever so that she had done. If anything his reactive repeat a potential study. In the interim to refer to wound care clinic for assistance with dressing and maintenance of this wound. Depending on how well he'll determine whether or not we would proceed with invasive evaluation. At this time and no active signs of infection I see no reason for her to actually take the antibiotics.  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   Followup: To be determined once we have the ultrasound results and see has been seen by the wound care specialists.  Leonie Man, M.D., M.S. [facility] [address] [place],   [address]  [phone] Pager # [phone] 05/09/2013 6:10 PM

## 2013-05-06 NOTE — Patient Instructions (Addendum)
Unfortunately, I do not have the results from your ultrasound study.  I need that to figure out our next step.  I am going to refer you to a Wound Care Specialist  We will hve you come back in once we know the results.  Leonie Man, MD

## 2013-05-08 ENCOUNTER — Telehealth: Payer: Self-pay | Admitting: Cardiology

## 2013-05-08 NOTE — Telephone Encounter (Signed)
Faxed referral to wound care center at [facility].

## 2013-05-09 NOTE — Assessment & Plan Note (Signed)
Clarification of her grocery Dopplers is yet to be obtained. She says that she went summary at the study done, despite the fact that she was told several times with her family member that she was to come to this facility for her studies to be done. Somehow she got notified to go somewhere else. I'm not sure how this happened.  This essentially delayed her care partly due to her misunderstanding and partly due to some unknown air or of why this was ordered somewhere else.

## 2013-05-09 NOTE — Assessment & Plan Note (Signed)
Currently stable, with no active symptoms. On aspirin, statin, ARB, beta blocker and calcium channel blocker.

## 2013-05-09 NOTE — Assessment & Plan Note (Signed)
No longer looks to be as severe as last week. Is still concerning. Referred to Wound Care Clinic for dressing and overall treatment. Also for education. Will need to try to get whatever ultrasound study she had to review. My suspicion is this may be done [facility]. Depending what that shows living house her foot heals, would consider peripheral angiography. I would refer her to Dr. Quay Burow in this office for this procedure.

## 2013-05-19 ENCOUNTER — Ambulatory Visit: Payer: Medicare Other | Admitting: Cardiology

## 2013-05-27 ENCOUNTER — Encounter (HOSPITAL_BASED_OUTPATIENT_CLINIC_OR_DEPARTMENT_OTHER): Payer: Medicare Other | Attending: General Surgery

## 2013-05-29 ENCOUNTER — Encounter (HOSPITAL_COMMUNITY): Payer: Self-pay | Admitting: Adult Health

## 2013-05-29 ENCOUNTER — Inpatient Hospital Stay (HOSPITAL_COMMUNITY)
Admission: EM | Admit: 2013-05-29 | Discharge: 2013-06-10 | DRG: 299 | Disposition: A | Discharge status: Home Health | Payer: Medicare Other | Attending: Internal Medicine | Admitting: Internal Medicine

## 2013-05-29 ENCOUNTER — Telehealth: Payer: Self-pay | Admitting: *Deleted

## 2013-05-29 DIAGNOSIS — N183 Chronic kidney disease, stage 3 unspecified: Secondary | ICD-10-CM

## 2013-05-29 DIAGNOSIS — E1165 Type 2 diabetes mellitus with hyperglycemia: Secondary | ICD-10-CM

## 2013-05-29 DIAGNOSIS — D72829 Elevated white blood cell count, unspecified: Secondary | ICD-10-CM

## 2013-05-29 DIAGNOSIS — N189 Chronic kidney disease, unspecified: Secondary | ICD-10-CM

## 2013-05-29 DIAGNOSIS — R112 Nausea with vomiting, unspecified: Secondary | ICD-10-CM

## 2013-05-29 DIAGNOSIS — E66813 Obesity, class 3: Secondary | ICD-10-CM

## 2013-05-29 DIAGNOSIS — R51 Headache: Secondary | ICD-10-CM

## 2013-05-29 DIAGNOSIS — N184 Chronic kidney disease, stage 4 (severe): Secondary | ICD-10-CM | POA: Diagnosis present

## 2013-05-29 DIAGNOSIS — L97509 Non-pressure chronic ulcer of other part of unspecified foot with unspecified severity: Secondary | ICD-10-CM | POA: Diagnosis present

## 2013-05-29 DIAGNOSIS — Z87891 Personal history of nicotine dependence: Secondary | ICD-10-CM

## 2013-05-29 DIAGNOSIS — N179 Acute kidney failure, unspecified: Secondary | ICD-10-CM

## 2013-05-29 DIAGNOSIS — E1149 Type 2 diabetes mellitus with other diabetic neurological complication: Secondary | ICD-10-CM | POA: Diagnosis present

## 2013-05-29 DIAGNOSIS — K5900 Constipation, unspecified: Secondary | ICD-10-CM

## 2013-05-29 DIAGNOSIS — Z8542 Personal history of malignant neoplasm of other parts of uterus: Secondary | ICD-10-CM

## 2013-05-29 DIAGNOSIS — E1129 Type 2 diabetes mellitus with other diabetic kidney complication: Secondary | ICD-10-CM | POA: Diagnosis present

## 2013-05-29 DIAGNOSIS — I2510 Atherosclerotic heart disease of native coronary artery without angina pectoris: Secondary | ICD-10-CM

## 2013-05-29 DIAGNOSIS — E1169 Type 2 diabetes mellitus with other specified complication: Secondary | ICD-10-CM | POA: Diagnosis present

## 2013-05-29 DIAGNOSIS — R6520 Severe sepsis without septic shock: Secondary | ICD-10-CM | POA: Diagnosis not present

## 2013-05-29 DIAGNOSIS — I639 Cerebral infarction, unspecified: Secondary | ICD-10-CM

## 2013-05-29 DIAGNOSIS — I739 Peripheral vascular disease, unspecified: Secondary | ICD-10-CM

## 2013-05-29 DIAGNOSIS — Z7982 Long term (current) use of aspirin: Secondary | ICD-10-CM

## 2013-05-29 DIAGNOSIS — I959 Hypotension, unspecified: Secondary | ICD-10-CM | POA: Diagnosis not present

## 2013-05-29 DIAGNOSIS — Z79899 Other long term (current) drug therapy: Secondary | ICD-10-CM

## 2013-05-29 DIAGNOSIS — A419 Sepsis, unspecified organism: Secondary | ICD-10-CM | POA: Diagnosis not present

## 2013-05-29 DIAGNOSIS — Z8673 Personal history of transient ischemic attack (TIA), and cerebral infarction without residual deficits: Secondary | ICD-10-CM

## 2013-05-29 DIAGNOSIS — D638 Anemia in other chronic diseases classified elsewhere: Secondary | ICD-10-CM

## 2013-05-29 DIAGNOSIS — I798 Other disorders of arteries, arterioles and capillaries in diseases classified elsewhere: Secondary | ICD-10-CM | POA: Diagnosis present

## 2013-05-29 DIAGNOSIS — N170 Acute kidney failure with tubular necrosis: Secondary | ICD-10-CM | POA: Diagnosis present

## 2013-05-29 DIAGNOSIS — E1101 Type 2 diabetes mellitus with hyperosmolarity with coma: Secondary | ICD-10-CM

## 2013-05-29 DIAGNOSIS — IMO0002 Reserved for concepts with insufficient information to code with codable children: Secondary | ICD-10-CM | POA: Diagnosis present

## 2013-05-29 DIAGNOSIS — Z9861 Coronary angioplasty status: Secondary | ICD-10-CM

## 2013-05-29 DIAGNOSIS — N185 Chronic kidney disease, stage 5: Secondary | ICD-10-CM | POA: Diagnosis present

## 2013-05-29 DIAGNOSIS — Z794 Long term (current) use of insulin: Secondary | ICD-10-CM

## 2013-05-29 DIAGNOSIS — I152 Hypertension secondary to endocrine disorders: Secondary | ICD-10-CM

## 2013-05-29 DIAGNOSIS — I96 Gangrene, not elsewhere classified: Secondary | ICD-10-CM | POA: Diagnosis present

## 2013-05-29 DIAGNOSIS — E1159 Type 2 diabetes mellitus with other circulatory complications: Principal | ICD-10-CM | POA: Diagnosis present

## 2013-05-29 DIAGNOSIS — N058 Unspecified nephritic syndrome with other morphologic changes: Secondary | ICD-10-CM | POA: Diagnosis present

## 2013-05-29 DIAGNOSIS — E1142 Type 2 diabetes mellitus with diabetic polyneuropathy: Secondary | ICD-10-CM | POA: Diagnosis present

## 2013-05-29 DIAGNOSIS — I252 Old myocardial infarction: Secondary | ICD-10-CM

## 2013-05-29 DIAGNOSIS — I150 Renovascular hypertension: Secondary | ICD-10-CM

## 2013-05-29 DIAGNOSIS — E11621 Type 2 diabetes mellitus with foot ulcer: Secondary | ICD-10-CM

## 2013-05-29 DIAGNOSIS — I120 Hypertensive chronic kidney disease with stage 5 chronic kidney disease or end stage renal disease: Secondary | ICD-10-CM | POA: Diagnosis present

## 2013-05-29 DIAGNOSIS — E785 Hyperlipidemia, unspecified: Secondary | ICD-10-CM

## 2013-05-29 DIAGNOSIS — Z6841 Body Mass Index (BMI) 40.0 and over, adult: Secondary | ICD-10-CM

## 2013-05-29 DIAGNOSIS — R5381 Other malaise: Secondary | ICD-10-CM | POA: Diagnosis present

## 2013-05-29 LAB — CBC WITH DIFFERENTIAL/PLATELET
Eosinophils Relative: 1 % (ref 0–5)
HCT: 23.8 % — ABNORMAL LOW (ref 36.0–46.0)
Hemoglobin: 7.9 g/dL — ABNORMAL LOW (ref 12.0–15.0)
Lymphocytes Relative: 17 % (ref 12–46)
Lymphs Abs: 2 10*3/uL (ref 0.7–4.0)
MCV: 83.5 fL (ref 78.0–100.0)
Monocytes Absolute: 1.3 10*3/uL — ABNORMAL HIGH (ref 0.1–1.0)
Monocytes Relative: 11 % (ref 3–12)
Neutro Abs: 8.5 10*3/uL — ABNORMAL HIGH (ref 1.7–7.7)
WBC: 12 10*3/uL — ABNORMAL HIGH (ref 4.0–10.5)

## 2013-05-29 LAB — BASIC METABOLIC PANEL
BUN: 53 mg/dL — ABNORMAL HIGH (ref 6–23)
CO2: 25 mEq/L (ref 19–32)
Chloride: 100 mEq/L (ref 96–112)
Creatinine, Ser: 2.49 mg/dL — ABNORMAL HIGH (ref 0.50–1.10)
Glucose, Bld: 241 mg/dL — ABNORMAL HIGH (ref 70–99)

## 2013-05-29 LAB — GLUCOSE, CAPILLARY: Glucose-Capillary: 178 mg/dL — ABNORMAL HIGH (ref 70–99)

## 2013-05-29 NOTE — Telephone Encounter (Signed)
Spoke to patients daughter. Appointment was made with Dr Gwenlyn Found. Dgtr stated patient did not make her appt to the wound clinic.It was reschedule 06/10/13. Instruct to keep that appt. Dgtr was concerned about her mother foot.Instruct if becomes concerning to her she can take her to [facility].

## 2013-05-29 NOTE — ED Notes (Signed)
preents to ED with left 2nd toe infection, foul odor, pain and redness and erythema up foot. Pt c/o pain on bottom of foot as well. +1 pedal pulse.

## 2013-05-29 NOTE — Telephone Encounter (Signed)
Left message to callback for an appointment with Dr Gwenlyn Found on 7/17. Will try other daughter Rogelio Seen

## 2013-05-29 NOTE — Telephone Encounter (Signed)
Spoke to [employer]. Informed her that we were unable to get in touch with her mother to give her an appointment next week with  Dr Gwenlyn Found. To discuss options concerning her ulcer on her toe.

## 2013-05-30 ENCOUNTER — Inpatient Hospital Stay (HOSPITAL_COMMUNITY): Payer: Medicare Other

## 2013-05-30 DIAGNOSIS — E1169 Type 2 diabetes mellitus with other specified complication: Secondary | ICD-10-CM

## 2013-05-30 DIAGNOSIS — I2510 Atherosclerotic heart disease of native coronary artery without angina pectoris: Secondary | ICD-10-CM

## 2013-05-30 DIAGNOSIS — L97509 Non-pressure chronic ulcer of other part of unspecified foot with unspecified severity: Secondary | ICD-10-CM

## 2013-05-30 DIAGNOSIS — D638 Anemia in other chronic diseases classified elsewhere: Secondary | ICD-10-CM

## 2013-05-30 DIAGNOSIS — I739 Peripheral vascular disease, unspecified: Secondary | ICD-10-CM

## 2013-05-30 LAB — GLUCOSE, CAPILLARY
Glucose-Capillary: 345 mg/dL — ABNORMAL HIGH (ref 70–99)
Glucose-Capillary: 159 mg/dL — ABNORMAL HIGH (ref 70–99)
Glucose-Capillary: 229 mg/dL — ABNORMAL HIGH (ref 70–99)

## 2013-05-30 LAB — BASIC METABOLIC PANEL
BUN: 53 mg/dL — ABNORMAL HIGH (ref 6–23)
Calcium: 8.6 mg/dL (ref 8.4–10.5)
GFR calc non Af Amer: 19 mL/min — ABNORMAL LOW (ref >90)
Glucose, Bld: 356 mg/dL — ABNORMAL HIGH (ref 70–99)
Potassium: 4.1 mEq/L (ref 3.5–5.1)

## 2013-05-30 LAB — CBC
Hemoglobin: 7.4 g/dL — ABNORMAL LOW (ref 12.0–15.0)
MCH: 27.9 pg (ref 26.0–34.0)
MCHC: 33 g/dL (ref 30.0–36.0)

## 2013-05-30 MED ORDER — GLYCERIN (LAXATIVE) 2.1 G RE SUPP
1.0000 | Freq: Every day | RECTAL | Status: DC | PRN
  Administered 2013-05-31: 1 via RECTAL
  Filled 2013-05-30 (×2): qty 1

## 2013-05-30 MED ORDER — ONDANSETRON HCL 4 MG/2ML IJ SOLN
4.0000 mg | Freq: Four times a day (QID) | INTRAMUSCULAR | Status: DC | PRN
  Administered 2013-05-30: 4 mg via INTRAVENOUS

## 2013-05-30 MED ORDER — SODIUM CHLORIDE 0.9 % IV BOLUS (SEPSIS)
1000.0000 mL | Freq: Once | INTRAVENOUS | Status: AC
  Administered 2013-05-30: 1000 mL via INTRAVENOUS

## 2013-05-30 MED ORDER — VANCOMYCIN HCL 10 G IV SOLR
2000.0000 mg | Freq: Once | INTRAVENOUS | Status: AC
  Administered 2013-05-30: 2000 mg via INTRAVENOUS
  Filled 2013-05-30: qty 2000

## 2013-05-30 MED ORDER — INSULIN GLARGINE 100 UNIT/ML ~~LOC~~ SOLN
48.0000 [IU] | Freq: Every day | SUBCUTANEOUS | Status: DC
  Administered 2013-05-30 – 2013-05-31 (×3): 48 [IU] via SUBCUTANEOUS
  Filled 2013-05-30 (×4): qty 0.48

## 2013-05-30 MED ORDER — LOSARTAN POTASSIUM 50 MG PO TABS
100.0000 mg | ORAL_TABLET | Freq: Every day | ORAL | Status: DC
  Administered 2013-05-30: 100 mg via ORAL
  Filled 2013-05-30 (×2): qty 2

## 2013-05-30 MED ORDER — OMEGA-3-ACID ETHYL ESTERS 1 G PO CAPS
2.0000 g | ORAL_CAPSULE | Freq: Every day | ORAL | Status: DC
  Administered 2013-05-30 – 2013-06-10 (×11): 2 g via ORAL
  Filled 2013-05-30 (×12): qty 2

## 2013-05-30 MED ORDER — AMLODIPINE BESYLATE 5 MG PO TABS
5.0000 mg | ORAL_TABLET | Freq: Every day | ORAL | Status: DC
  Administered 2013-05-30: 5 mg via ORAL
  Filled 2013-05-30 (×2): qty 1

## 2013-05-30 MED ORDER — PIPERACILLIN-TAZOBACTAM 3.375 G IVPB
3.3750 g | Freq: Once | INTRAVENOUS | Status: AC
  Administered 2013-05-30: 3.375 g via INTRAVENOUS
  Filled 2013-05-30: qty 50

## 2013-05-30 MED ORDER — ATORVASTATIN CALCIUM 40 MG PO TABS
40.0000 mg | ORAL_TABLET | Freq: Every day | ORAL | Status: DC
  Administered 2013-05-30 – 2013-06-09 (×11): 40 mg via ORAL
  Filled 2013-05-30 (×12): qty 1

## 2013-05-30 MED ORDER — LOSARTAN POTASSIUM-HCTZ 100-25 MG PO TABS: 1.0000 | ORAL_TABLET | Freq: Every day | ORAL | Status: DC

## 2013-05-30 MED ORDER — ACETAMINOPHEN 325 MG PO TABS
650.0000 mg | ORAL_TABLET | Freq: Four times a day (QID) | ORAL | Status: DC | PRN
  Administered 2013-05-30 – 2013-06-10 (×13): 650 mg via ORAL
  Filled 2013-05-30 (×12): qty 2

## 2013-05-30 MED ORDER — PIPERACILLIN-TAZOBACTAM 3.375 G IVPB
3.3750 g | Freq: Three times a day (TID) | INTRAVENOUS | Status: DC
  Administered 2013-05-30 – 2013-06-01 (×6): 3.375 g via INTRAVENOUS
  Filled 2013-05-30 (×10): qty 50

## 2013-05-30 MED ORDER — GABAPENTIN 100 MG PO CAPS
100.0000 mg | ORAL_CAPSULE | Freq: Three times a day (TID) | ORAL | Status: DC
  Administered 2013-05-30 – 2013-06-01 (×7): 100 mg via ORAL
  Filled 2013-05-30 (×9): qty 1

## 2013-05-30 MED ORDER — INSULIN ASPART 100 UNIT/ML ~~LOC~~ SOLN
0.0000 [IU] | Freq: Three times a day (TID) | SUBCUTANEOUS | Status: DC
  Administered 2013-05-30: 3 [IU] via SUBCUTANEOUS
  Administered 2013-05-30: 11 [IU] via SUBCUTANEOUS
  Administered 2013-05-30 – 2013-05-31 (×4): 3 [IU] via SUBCUTANEOUS
  Administered 2013-06-01: 2 [IU] via SUBCUTANEOUS
  Administered 2013-06-01 (×2): 3 [IU] via SUBCUTANEOUS
  Administered 2013-06-02: 2 [IU] via SUBCUTANEOUS
  Administered 2013-06-02: 3 [IU] via SUBCUTANEOUS
  Administered 2013-06-02 – 2013-06-03 (×2): 5 [IU] via SUBCUTANEOUS
  Administered 2013-06-03 – 2013-06-04 (×2): 3 [IU] via SUBCUTANEOUS
  Administered 2013-06-04: 2 [IU] via SUBCUTANEOUS
  Administered 2013-06-04: 3 [IU] via SUBCUTANEOUS
  Administered 2013-06-05: 5 [IU] via SUBCUTANEOUS
  Administered 2013-06-05: 11 [IU] via SUBCUTANEOUS
  Administered 2013-06-05 – 2013-06-06 (×2): 5 [IU] via SUBCUTANEOUS
  Administered 2013-06-06: 8 [IU] via SUBCUTANEOUS
  Administered 2013-06-06 – 2013-06-07 (×2): 11 [IU] via SUBCUTANEOUS
  Administered 2013-06-07 (×2): 5 [IU] via SUBCUTANEOUS
  Administered 2013-06-08: 8 [IU] via SUBCUTANEOUS
  Administered 2013-06-08: 5 [IU] via SUBCUTANEOUS
  Administered 2013-06-08: 8 [IU] via SUBCUTANEOUS
  Administered 2013-06-09: 5 [IU] via SUBCUTANEOUS
  Administered 2013-06-09: 8 [IU] via SUBCUTANEOUS
  Administered 2013-06-09: 5 [IU] via SUBCUTANEOUS
  Administered 2013-06-10: 3 [IU] via SUBCUTANEOUS
  Administered 2013-06-10: 5 [IU] via SUBCUTANEOUS

## 2013-05-30 MED ORDER — VANCOMYCIN HCL 10 G IV SOLR
1500.0000 mg | INTRAVENOUS | Status: DC
  Administered 2013-06-01: 1500 mg via INTRAVENOUS
  Filled 2013-05-30: qty 1500

## 2013-05-30 MED ORDER — INSULIN ASPART 100 UNIT/ML ~~LOC~~ SOLN
0.0000 [IU] | Freq: Every day | SUBCUTANEOUS | Status: DC
  Administered 2013-05-30: 2 [IU] via SUBCUTANEOUS
  Administered 2013-05-31: 3 [IU] via SUBCUTANEOUS
  Administered 2013-06-02: 2 [IU] via SUBCUTANEOUS
  Administered 2013-06-03: 22:00:00 via SUBCUTANEOUS
  Administered 2013-06-05: 2 [IU] via SUBCUTANEOUS
  Administered 2013-06-06: 4 [IU] via SUBCUTANEOUS
  Administered 2013-06-07: 3 [IU] via SUBCUTANEOUS
  Administered 2013-06-08: 5 [IU] via SUBCUTANEOUS
  Administered 2013-06-09: 3 [IU] via SUBCUTANEOUS

## 2013-05-30 MED ORDER — HYDROCHLOROTHIAZIDE 25 MG PO TABS
25.0000 mg | ORAL_TABLET | Freq: Every day | ORAL | Status: DC
  Administered 2013-05-30: 25 mg via ORAL
  Filled 2013-05-30 (×2): qty 1

## 2013-05-30 MED ORDER — ASPIRIN EC 81 MG PO TBEC
81.0000 mg | DELAYED_RELEASE_TABLET | Freq: Every day | ORAL | Status: DC
  Administered 2013-05-30 – 2013-06-10 (×11): 81 mg via ORAL
  Filled 2013-05-30 (×12): qty 1

## 2013-05-30 MED ORDER — ONDANSETRON HCL 4 MG/2ML IJ SOLN
INTRAMUSCULAR | Status: AC
  Filled 2013-05-30: qty 2

## 2013-05-30 MED ORDER — SODIUM CHLORIDE 0.9 % IV SOLN
INTRAVENOUS | Status: AC
  Administered 2013-05-30: 04:00:00 via INTRAVENOUS

## 2013-05-30 MED ORDER — CARVEDILOL 12.5 MG PO TABS
12.5000 mg | ORAL_TABLET | Freq: Two times a day (BID) | ORAL | Status: DC
  Administered 2013-05-30 – 2013-06-10 (×20): 12.5 mg via ORAL
  Filled 2013-05-30 (×25): qty 1

## 2013-05-30 NOTE — Progress Notes (Signed)
Pt was found on the floor around [place] facing down on the left side of the bed.  Pt denied hitting her head and is alert and oriented. Helped pt to turn over and lifted back to bed by maxi sky in the room.   VS stable,  Pt stated that she had sled down and was turning to her left side then fell to the floor.  Pt stated she did hit her left knee.  Upon assessing her left knee has a small superficial skin tear.  No bleeding or swollen noted.   Pt denied any pain.  Daughter Nilda Simmer , NP, [facility] and director notified. Orders received.  Will update daughter with any change in status.  Will continue to monitor.

## 2013-05-30 NOTE — ED Notes (Signed)
Remains in XR

## 2013-05-30 NOTE — Progress Notes (Signed)
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 Gwenlyn Found to see on Monday  Anemia of chronic disease - transfuse when < 7 (CAD but no active CP)  Type II or unspecified type diabetes mellitus with renal manifestations, uncontrolled- SSI and home meds  CAD (coronary artery disease), native coronary artery- stable, no CP   Peripheral arterial disease   CKD- stable  H/o CVA    Code Status: full Family Communication: patient at bedside Disposition Plan: inpt   Consultants:  Berry- Monday  Procedures:  none  Antibiotics:  vanc/zosyn  HPI/Subjective: No pain No fevers No complaints  Objective: Filed Vitals:   05/30/13 0010 05/30/13 0015 05/30/13 0241 05/30/13 0657  BP: 119/48 119/48 125/49 118/48  Pulse:  71 77 81  Temp: 98.4 F (36.9 C)  99.2 F (37.3 C) 100.1 F (37.8 C)  TempSrc: Oral  Oral Oral  Resp: 16  16 16   SpO2: 100% 100% 95% 96%    Intake/Output Summary (Last 24 hours) at 05/30/13 1210 Last data filed at 05/30/13 0657  Gross per 24 hour  Intake    240 ml  Output      0 ml  Net    240 ml   There were no vitals filed for this visit.  Exam:   General:  A+Ox3, NAD- flat affect  Cardiovascular: rrr  Respiratory: clear anterior  Abdomen: +BS, soft, NT  Musculoskeletal: moves all 4ext   Data Reviewed: Basic Metabolic Panel:  Recent Labs Lab 05/29/13 2116 05/30/13 0405  NA 133* 135  K 4.3 4.1  CL 100 100  CO2 25 23  GLUCOSE 241* 356*  BUN 53* 53*  CREATININE 2.49* 2.56*  CALCIUM 8.8 8.6   Liver Function Tests: No results found for this basename: AST, ALT, ALKPHOS, BILITOT, PROT, ALBUMIN,  in the last 168 hours No results found for this basename: LIPASE, AMYLASE,  in the last 168 hours No results found for this basename: AMMONIA,  in the last 168  hours CBC:  Recent Labs Lab 05/29/13 2116 05/30/13 0405  WBC 12.0* 10.7*  NEUTROABS 8.5*  --   HGB 7.9* 7.4*  HCT 23.8* 22.4*  MCV 83.5 84.5  PLT 198 189   Cardiac Enzymes: No results found for this basename: CKTOTAL, CKMB, CKMBINDEX, TROPONINI,  in the last 168 hours BNP (last 3 results)  Recent Labs  09/25/12 1250  PROBNP 1010.0*   CBG:  Recent Labs Lab 05/29/13 2058 05/30/13 0650  GLUCAP 178* 345*    No results found for this or any previous visit (from the past 240 hour(s)).   Studies: Dg Toe 2nd Left  05/30/2013   *RADIOLOGY REPORT*  Clinical Data: Wound infection of second digit left foot.  LEFT SECOND TOE  Comparison: None.  Findings: Diffuse vascular calcifications in the left foot.  No radiopaque foreign bodies or gas collections in the soft tissues. Bones appear intact.  No evidence of bone erosion or cortical irregularity to suggest osteomyelitis.  No acute fracture or subluxation.  Diffuse bone demineralization.  IMPRESSION: No acute bony abnormalities.  No evidence of osteomyelitis.   Original Report Authenticated By: Lucienne Capers, M.D.    Scheduled Meds: . amLODipine  5 mg Oral Q1200  . aspirin EC  81 mg Oral Daily  . atorvastatin  40 mg Oral q1800  . carvedilol  12.5 mg Oral BID WC  .  gabapentin  100 mg Oral TID  . losartan  100 mg Oral Daily   And  . hydrochlorothiazide  25 mg Oral Daily  . insulin aspart  0-15 Units Subcutaneous TID WC  . insulin aspart  0-5 Units Subcutaneous QHS  . insulin glargine  48 Units Subcutaneous QHS  . omega-3 acid ethyl esters  2 g Oral Daily  . piperacillin-tazobactam (ZOSYN)  IV  3.375 g Intravenous Q8H  . [START ON 06/01/2013] vancomycin  1,500 mg Intravenous Q48H   Continuous Infusions: . 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 If 7PM-7AM, please contact night-coverage at [email or web address], password [facility] 05/30/2013, 12:10 PM  LOS: 1 day

## 2013-05-30 NOTE — ED Provider Notes (Signed)
History    CSN: [id number] Arrival date & time 05/29/13  2049  First MD Initiated Contact with Patient 05/29/13 2319     Chief Complaint  Patient presents with  . Wound Infection   (Consider location/radiation/quality/duration/timing/severity/associated sxs/prior Treatment) HPI Comments: Patient with a history of DM presents with a wound of her left second toe.  The wound has been present for the past several months.  Both the patient and her daughter report that the wound has become worse since yesterday.  They noticed that the skin had started coming off of the toe.  They have also noticed a foul odor from the wound.  They have not noticed any discharge.  The wound has been followed by Dr. Ellyn Hack with [facility].  She had ABI performed on 04/21/13 which showed moderate reduction in arterial blood flow.   She was also referred to Wound Management, but states that she does not have an appointment scheduled until 06/12/13.  She denies fever or chills.  Denies nausea or vomiting.  She has not noticed any swelling or edema of her leg.  She is currently not on any antibiotics.    The history is provided by the patient.   Past Medical History  Diagnosis Date  . ST elevation myocardial infarction (STEMI) of inferior wall  03/2009    Ostial/proximal RCA occlusion treated with BMS stent  . CAD (coronary artery disease), native coronary artery      status post PCI to the RCA for inferior STEMI  . Cancer     endo ca  . Hypertension associated with diabetes   . Hyperlipidemia LDL goal <70   . Diabetes mellitus, type II, insulin dependent     With complications - CAD, CVA, peripheral ulcer ,  . Stroke, acute, within 8 weeks  4/30/ 2014    Right-sided weakness, mostly with balance issues and only mild weakness.  . Diabetic peripheral neuropathy associated with type 2 diabetes mellitus   . Obesity, Class III, BMI 40-49.9 (morbid obesity)     BMI 43; 5' 2',  235 pounds 6.4 ounces  . CKD  (chronic kidney disease), stage III    Past Surgical History  Procedure Laterality Date  . Abdominal hysterectomy    . Leg tendon surgery      patient fell in a store right leg  . Leg surgery      hole in bone( during Childhood)  . Cardiac catheterization  03/31/2009  . Coronary angioplasty  03/31/2009    PTCA , bare-metal stent 3.5 mm x 24 mm ostium of RCA ,EF 55%   Family History  Problem Relation Age of Onset  . Alcoholism Mother     Died from complications  . Alcoholism Father     Died from complications  . Heart disease      Unknown, unclear. Patient is not a good historian.  Essentially no pertinent history known   History  Substance Use Topics  . Smoking status: Former Smoker    Quit date: 04/29/1995  . Smokeless tobacco: Never Used  . Alcohol Use: No   OB History   Grav Para Term Preterm Abortions TAB SAB Ect Mult Living                 Review of Systems  Skin: Positive for wound.  All other systems reviewed and are negative.    Allergies  Review of patient's allergies indicates no known allergies.  Home Medications   Current Outpatient  Rx  Name  Route  Sig  Dispense  Refill  . amLODipine (NORVASC) 5 MG tablet   Oral   Take 1 tablet (5 mg total) by mouth daily at 12 noon.         Marland Kitchen aspirin EC 81 MG tablet   Oral   Take 81 mg by mouth daily.         Marland Kitchen atorvastatin (LIPITOR) 40 MG tablet   Oral   Take 1 tablet (40 mg total) by mouth daily at 6 PM.         . carvedilol (COREG) 12.5 MG tablet   Oral   Take 12.5 mg by mouth 2 (two) times daily with a meal.         . gabapentin (NEURONTIN) 100 MG capsule   Oral   Take 100 mg by mouth 3 (three) times daily.         . insulin glargine (LANTUS) 100 UNIT/ML injection   Subcutaneous   Inject 48 Units into the skin at bedtime.          Marland Kitchen losartan-hydrochlorothiazide (HYZAAR) 100-25 MG per tablet   Oral   Take 1 tablet by mouth daily.         Marland Kitchen omega-3 acid ethyl esters (LOVAZA) 1 G  capsule   Oral   Take 2 g by mouth daily.         . silver sulfADIAZINE (SILVADENE) 1 % cream   Topical   Apply 1 application topically daily. Apply to toe         . amoxicillin-clavulanate (AUGMENTIN) 875-125 MG per tablet   Oral   Take 1 tablet by mouth 2 (two) times daily.   20 tablet   0    BP 119/48  Pulse 79  Temp(Src) 98.4 F (36.9 C) (Oral)  Resp 16  SpO2 100% Physical Exam  Nursing note and vitals reviewed. Constitutional: She appears well-developed and well-nourished.  HENT:  Head: Normocephalic and atraumatic.  Mouth/Throat: Oropharynx is clear and moist.  Neck: Normal range of motion. Neck supple.  Cardiovascular: Normal rate, regular rhythm and normal heart sounds.   Faint DP pulse palpated of the left foot  Pulmonary/Chest: Effort normal and breath sounds normal.  Neurological: She is alert.  Skin: Skin is warm and dry.  left second toe with necrotic tissue, chronic wound, no drainage with some erythema of toe left second  Psychiatric: She has a normal mood and affect.    ED Course  Procedures (including critical care time) Labs Reviewed  GLUCOSE, CAPILLARY - Abnormal; Notable for the following:    Glucose-Capillary 178 (*)    All other components within normal limits  CBC WITH DIFFERENTIAL - Abnormal; Notable for the following:    WBC 12.0 (*)    RBC 2.85 (*)    Hemoglobin 7.9 (*)    HCT 23.8 (*)    Neutro Abs 8.5 (*)    Monocytes Absolute 1.3 (*)    All other components within normal limits  BASIC METABOLIC PANEL - Abnormal; Notable for the following:    Sodium 133 (*)    Glucose, Bld 241 (*)    BUN 53 (*)    Creatinine, Ser 2.49 (*)    GFR calc non Af Amer 20 (*)    GFR calc Af Amer 23 (*)    All other components within normal limits   No results found. No diagnosis found.  Patient discussed with Dr. Lita Mains who also evaluated the patient.  Patient discussed with Dr. Shanon Brow who has agreed to admit the patient.    MDM  Patient  presents with a wound of her foot.  She has had a diabetic foot ulcer that has been managed by Dr. Ellyn Hack with [facility].  Patient and daughter report that the wound has worsened at this time.  Wound has some gangrenous changes at this time.  She does have a faint DP pulse of the foot.  Patient started on IV Vancomycin and IV Zosyn in the ED and patient admitted to [facility] for further management.  Sherlyn Lees Underwood, PA-C 06/01/13 [phone]

## 2013-05-30 NOTE — Progress Notes (Signed)
ANTIBIOTIC CONSULT NOTE - INITIAL  Pharmacy Consult for vancomycin Indication: toe infection  No Known Allergies  Patient Measurements:   Total Body Weight: 107.1 kg  Vital Signs: Temp: 98.4 F (36.9 C) (07/12 0010) Temp src: Oral (07/12 0010) BP: 119/48 mmHg (07/12 0015) Pulse Rate: 71 (07/12 0015) Intake/Output from previous day:   Intake/Output from this shift:    Labs:  Recent Labs  05/29/13 2116  WBC 12.0*  HGB 7.9*  PLT 198  CREATININE 2.49*   The CrCl is unknown because both a height and weight (above a minimum accepted value) are required for this calculation. No results found for this basename: VANCOTROUGH, VANCOPEAK, VANCORANDOM, GENTTROUGH, GENTPEAK, GENTRANDOM, TOBRATROUGH, TOBRAPEAK, TOBRARND, AMIKACINPEAK, AMIKACINTROU, AMIKACIN,  in the last 72 hours   Microbiology: No results found for this or any previous visit (from the past 720 hour(s)).  Medical History: Past Medical History  Diagnosis Date  . ST elevation myocardial infarction (STEMI) of inferior wall  03/2009    Ostial/proximal RCA occlusion treated with BMS stent  . CAD (coronary artery disease), native coronary artery      status post PCI to the RCA for inferior STEMI  . Cancer     endo ca  . Hypertension associated with diabetes   . Hyperlipidemia LDL goal <70   . Diabetes mellitus, type II, insulin dependent     With complications - CAD, CVA, peripheral ulcer ,  . Stroke, acute, within 8 weeks  4/30/ 2014    Right-sided weakness, mostly with balance issues and only mild weakness.  . Diabetic peripheral neuropathy associated with type 2 diabetes mellitus   . Obesity, Class III, BMI 40-49.9 (morbid obesity)     BMI 43; 5' 2',  235 pounds 6.4 ounces  . CKD (chronic kidney disease), stage III     Medications:  Augmentin 875 mg po BID ordered 04/28/13 with course completed  Assessment: [name] is a 62 yo F to start vancomycin per pharmacy for a left 2nd toe infection.  She has foul  odor, pain and redness and erythema of foot.  Her WBC is 12 and her creat is 2.49 with creat cl ~ 23 ml/min.  Wt = 107.1 kg.    Zosyn 3.375 gm given in ED 7/12 at 0030  Goal of Therapy:  Vancomycin trough level 10-15 mcg/ml  Plan:  1. Vancomycin loading dose of 2000 mg IV x 1 dose now 2. Vancomycin 1500 mg IV q48 hours for creat cl < 30 - first dose scheduled for 7/14 at 0000 3. F/u renal function closely 4. F/u WBC, fever, culture data, clinical course Eudelia Bunch, Pharm.D. [id number] 05/30/2013 12:43 AM

## 2013-05-30 NOTE — H&P (Signed)
PCP:   Hayden Rasmussen., MD   Chief Complaint:  Toe turning colors  HPI: 62 yo female h/o dm, pvd, cad comes in with fighting left toe infection for weeks.  Has been on oral antibiotics and had recent abi showing moderate reduced flow to left lower extremity.  She has had necrosis to this toe that has progressively gotten worse and now with some mild erythema.  She has no pain due to peripheral neuropathy from her diabetes.  She denies fevers.  She finished some oral abx in the last week.  She has an appt with dr berry next week to look at her vascular studies and examine the foot.  She is tolerating po, no n/v.  No pain.  No sob.  No purulent discharge from wound.  Review of Systems:  Positive and negative as per HPI otherwise all other systems are negative  Past Medical History: Past Medical History  Diagnosis Date  . ST elevation myocardial infarction (STEMI) of inferior wall  03/2009    Ostial/proximal RCA occlusion treated with BMS stent  . CAD (coronary artery disease), native coronary artery      status post PCI to the RCA for inferior STEMI  . Cancer     endo ca  . Hypertension associated with diabetes   . Hyperlipidemia LDL goal <70   . Diabetes mellitus, type II, insulin dependent     With complications - CAD, CVA, peripheral ulcer ,  . Stroke, acute, within 8 weeks  4/30/ 2014    Right-sided weakness, mostly with balance issues and only mild weakness.  . Diabetic peripheral neuropathy associated with type 2 diabetes mellitus   . Obesity, Class III, BMI 40-49.9 (morbid obesity)     BMI 43; 5' 2',  235 pounds 6.4 ounces  . CKD (chronic kidney disease), stage III    Past Surgical History  Procedure Laterality Date  . Abdominal hysterectomy    . Leg tendon surgery      patient fell in a store right leg  . Leg surgery      hole in bone( during Childhood)  . Cardiac catheterization  03/31/2009  . Coronary angioplasty  03/31/2009    PTCA , bare-metal stent 3.5 mm x 24 mm  ostium of RCA ,EF 55%    Medications: Prior to Admission medications   Medication Sig Start Date End Date Taking? Authorizing Provider  amLODipine (NORVASC) 5 MG tablet Take 1 tablet (5 mg total) by mouth daily at 12 noon. 03/23/13  Yes Geradine Girt, DO  aspirin EC 81 MG tablet Take 81 mg by mouth daily.   Yes Historical Provider, MD  atorvastatin (LIPITOR) 40 MG tablet Take 1 tablet (40 mg total) by mouth daily at 6 PM. 03/23/13  Yes Jessica U Vann, DO  carvedilol (COREG) 12.5 MG tablet Take 12.5 mg by mouth 2 (two) times daily with a meal.   Yes Historical Provider, MD  gabapentin (NEURONTIN) 100 MG capsule Take 100 mg by mouth 3 (three) times daily.   Yes Historical Provider, MD  insulin glargine (LANTUS) 100 UNIT/ML injection Inject 48 Units into the skin at bedtime.  03/23/13  Yes Geradine Girt, DO  losartan-hydrochlorothiazide (HYZAAR) 100-25 MG per tablet Take 1 tablet by mouth daily.   Yes Historical Provider, MD  omega-3 acid ethyl esters (LOVAZA) 1 G capsule Take 2 g by mouth daily.   Yes Historical Provider, MD  silver sulfADIAZINE (SILVADENE) 1 % cream Apply 1 application topically daily. Apply to  toe   Yes Historical Provider, MD  amoxicillin-clavulanate (AUGMENTIN) 875-125 MG per tablet Take 1 tablet by mouth 2 (two) times daily. 04/28/13   Leonie Man, MD    Allergies:  No Known Allergies  Social History:  reports that she quit smoking about 18 years ago. She has never used smokeless tobacco. She reports that she does not drink alcohol or use illicit drugs.  Family History: Family History  Problem Relation Age of Onset  . Alcoholism Mother     Died from complications  . Alcoholism Father     Died from complications  . Heart disease      Unknown, unclear. Patient is not a good historian.  Essentially no pertinent history known    Physical Exam: Filed Vitals:   05/29/13 2053 05/30/13 0010 05/30/13 0015  BP: 131/45 119/48 119/48  Pulse: 79  71  Temp: 99.6 F (37.6  C) 98.4 F (36.9 C)   TempSrc: Oral Oral   Resp: 18 16   SpO2: 96% 100% 100%   General appearance: alert, cooperative and no distress Head: Normocephalic, without obvious abnormality, atraumatic Eyes: negative Nose: Nares normal. Septum midline. Mucosa normal. No drainage or sinus tenderness. Neck: no JVD and supple, symmetrical, trachea midline Lungs: clear to auscultation bilaterally Heart: regular rate and rhythm, S1, S2 normal, no murmur, click, rub or gallop Abdomen: soft, non-tender; bowel sounds normal; no masses,  no organomegaly Extremities: left second toe with necrotic tissue, chronic wound, no drainage with some erythema of toe left second Pulses: decreased pulses to left  Skin: Skin color, texture, turgor normal. No rashes or lesions other than to left foot/toe Neurologic: Grossly normal    Labs on Admission:   Recent Labs  05/29/13 2116  NA 133*  K 4.3  CL 100  CO2 25  GLUCOSE 241*  BUN 53*  CREATININE 2.49*  CALCIUM 8.8    Recent Labs  05/29/13 2116  WBC 12.0*  NEUTROABS 8.5*  HGB 7.9*  HCT 23.8*  MCV 83.5  PLT 198    Radiological Exams on Admission: No results found.  Assessment/Plan  62 yo female with chronic left diabetic foot ulcer with dry gangrenous changes Principal Problem:   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 Active Problems:   Anemia of chronic disease   Type II or unspecified type diabetes mellitus with renal manifestations, uncontrolled(250.42)   CAD (coronary artery disease), native coronary artery   Peripheral arterial disease  Place on iv vanco and zosyn.  Will need to call vascular/and surgery in am, has appt with dr berry next week.  Full code.   Oliana Gowens A 05/30/2013, 12:52 AM

## 2013-05-30 NOTE — ED Notes (Signed)
Patient transported to X-ray 

## 2013-05-30 NOTE — ED Notes (Signed)
Report given to floor.  Pt remains in XR

## 2013-05-31 ENCOUNTER — Inpatient Hospital Stay (HOSPITAL_COMMUNITY): Payer: Medicare Other

## 2013-05-31 DIAGNOSIS — D72829 Elevated white blood cell count, unspecified: Secondary | ICD-10-CM

## 2013-05-31 LAB — URINALYSIS, ROUTINE W REFLEX MICROSCOPIC
Bilirubin Urine: NEGATIVE
Ketones, ur: NEGATIVE mg/dL
Specific Gravity, Urine: 1.015 (ref 1.005–1.030)
pH: 5.5 (ref 5.0–8.0)

## 2013-05-31 LAB — CBC
MCH: 28.1 pg (ref 26.0–34.0)
MCHC: 33.2 g/dL (ref 30.0–36.0)
Platelets: 176 10*3/uL (ref 150–400)
RBC: 2.31 MIL/uL — ABNORMAL LOW (ref 3.87–5.11)
RDW: 16 % — ABNORMAL HIGH (ref 11.5–15.5)

## 2013-05-31 LAB — URINE MICROSCOPIC-ADD ON

## 2013-05-31 LAB — PREPARE RBC (CROSSMATCH)

## 2013-05-31 MED ORDER — SODIUM CHLORIDE 0.9 % IV SOLN
INTRAVENOUS | Status: DC
  Administered 2013-06-01 – 2013-06-02 (×2): via INTRAVENOUS

## 2013-05-31 NOTE — Progress Notes (Signed)
Patient behavior is noted to be different when RN enters the room vs.what is witnessed on camera.  Ex: patient will open eyes, respond and talk clearly to daughter when staff is not in room.  When staff, RN, MD and NT enters room, patient needs to be prompted x2-3 times to open mouth for temp to be taken, or arm to be raised for BP.  Behavior is inconsistent when staff is assisting patient in ADL's.  MD aware.

## 2013-05-31 NOTE — Progress Notes (Signed)
Patient got OOB to [facility], when she sat on commode, patient leaned backwards and started scooting BSC away from her body.  Daughter and NT slowly assisted pt to floor.  When nurse entered the room, pt was sitting in front of BSC on floor.  No c/o of pain.  No head injury noted or observed. VS wnl of patient, PRBC continues to be infused through IV.  Nsg to continue to monitor for status changes.

## 2013-05-31 NOTE — Progress Notes (Signed)
CRITICAL VALUE ALERT  Critical value received Hbg 6.5  Date of notification: 05/31/13  Time of notification: 0650  Critical value read back:yes  Nurse who received alert:  H. Annamary Carolin  MD notified (1st page):  Eliseo Squires  Time of first page:  0705  MD notified (2nd page):  Time of second page:  Responding MD:  Littie Deeds  Time MD responded:  [phone]

## 2013-05-31 NOTE — Progress Notes (Signed)
1400 Zosyn was not administered d/t PRBC administering.  Pharmacy consulted, MD aware. Nsg to continue to monitor.

## 2013-05-31 NOTE — Progress Notes (Signed)
[facility] PROGRESS NOTE  [name] [id number] DOB: Oct 17, 1951 DOA: 05/29/2013 PCP: Hayden Rasmussen., MD  Assessment/Plan: 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, IV abx, blood cultures, follow- Dr. Gwenlyn Found to see on Monday  Anemia of chronic disease - transfuse when < 7 (CAD but no active CP)- 2 units on 7/13  Type II or unspecified type diabetes mellitus with renal manifestations, uncontrolled- SSI and home meds  CAD (coronary artery disease), native coronary artery- stable, no CP   Peripheral arterial disease   AKI on CKD- IVF, hold BP meds- ? From low BP- check renal U/S-- baseline about 1.7  H/o CVA    Code Status: full Family Communication: patient at bedside- spoke with daughter yesterday Disposition Plan: inpt   Consultants:  Gwenlyn Found- Monday  Procedures:  none  Antibiotics:  vanc/zosyn  HPI/Subjective: Slid out of bed yesterday Sleepy today  Objective: Filed Vitals:   05/30/13 2008 05/30/13 2349 05/30/13 2356 05/31/13 0532  BP: 110/48 95/38 102/44 107/38  Pulse: 71 71  67  Temp: 98.9 F (37.2 C) 98.8 F (37.1 C)  98.5 F (36.9 C)  TempSrc: Oral Oral  Axillary  Resp: 16 14  16   SpO2: 94% 92% 99% 96%    Intake/Output Summary (Last 24 hours) at 05/31/13 1015 Last data filed at 05/30/13 1200  Gross per 24 hour  Intake    240 ml  Output      0 ml  Net    240 ml   There were no vitals filed for this visit.  Exam:   General:  A+Ox3, NAD- flat affect  Cardiovascular: rrr  Respiratory: clear anterior  Abdomen: +BS, soft, NT  Musculoskeletal: moves all 4ext   Data Reviewed: Basic Metabolic Panel:  Recent Labs Lab 05/29/13 2116 05/30/13 0405  NA 133* 135  K 4.3 4.1  CL 100 100  CO2 25 23  GLUCOSE 241* 356*  BUN 53* 53*  CREATININE 2.49* 2.56*  CALCIUM 8.8 8.6   Liver Function Tests: No results found for this basename: AST, ALT, ALKPHOS, BILITOT, PROT, ALBUMIN,  in the last 168 hours No results found for this  basename: LIPASE, AMYLASE,  in the last 168 hours No results found for this basename: AMMONIA,  in the last 168 hours CBC:  Recent Labs Lab 05/29/13 2116 05/30/13 0405 05/31/13 0525  WBC 12.0* 10.7* 11.8*  NEUTROABS 8.5*  --   --   HGB 7.9* 7.4* 6.5*  HCT 23.8* 22.4* 19.6*  MCV 83.5 84.5 84.8  PLT 198 189 176   Cardiac Enzymes: No results found for this basename: CKTOTAL, CKMB, CKMBINDEX, TROPONINI,  in the last 168 hours BNP (last 3 results)  Recent Labs  09/25/12 1250  PROBNP 1010.0*   CBG:  Recent Labs Lab 05/30/13 1157 05/30/13 1649 05/30/13 2003 05/30/13 2206 05/31/13 0702  GLUCAP 159* 178* 182* 229* 174*    No results found for this or any previous visit (from the past 240 hour(s)).   Studies: Ct Head Wo Contrast  05/30/2013   *RADIOLOGY REPORT*  Clinical Data: Left-sided weakness.  CT HEAD WITHOUT CONTRAST  Technique:  Contiguous axial images were obtained from the base of the skull through the vertex without contrast.  Comparison: 03/19/2013.  Findings: Large, old right cerebellar hemisphere infarct.  Old right occipital lobe infarct.  Patchy white matter low density in both cerebral hemispheres.  Minimally enlarged ventricles and subarachnoid spaces.  No intracranial hemorrhage, mass lesion or CT evidence of acute infarction.  Unremarkable bones  and paranasal sinuses.  Stable left frontal probable venous lake.  IMPRESSION:  1.  No acute abnormality. 2.  Old infarcts, chronic small vessel white matter ischemic changes and minimal atrophy, as described above.   Original Report Authenticated By: Claudie Revering, M.D.   Dg Toe 2nd Left  05/30/2013   *RADIOLOGY REPORT*  Clinical Data: Wound infection of second digit left foot.  LEFT SECOND TOE  Comparison: None.  Findings: Diffuse vascular calcifications in the left foot.  No radiopaque foreign bodies or gas collections in the soft tissues. Bones appear intact.  No evidence of bone erosion or cortical irregularity to  suggest osteomyelitis.  No acute fracture or subluxation.  Diffuse bone demineralization.  IMPRESSION: No acute bony abnormalities.  No evidence of osteomyelitis.   Original Report Authenticated By: Lucienne Capers, M.D.    Scheduled Meds: . aspirin EC  81 mg Oral Daily  . atorvastatin  40 mg Oral q1800  . carvedilol  12.5 mg Oral BID WC  . gabapentin  100 mg Oral TID  . insulin aspart  0-15 Units Subcutaneous TID WC  . insulin aspart  0-5 Units Subcutaneous QHS  . insulin glargine  48 Units Subcutaneous QHS  . losartan  100 mg Oral Daily  . omega-3 acid ethyl esters  2 g Oral Daily  . piperacillin-tazobactam (ZOSYN)  IV  3.375 g Intravenous Q8H  . [START ON 06/01/2013] vancomycin  1,500 mg Intravenous Q48H   Continuous Infusions:    Principal Problem:   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 Active Problems:   Anemia of chronic disease   Type II or unspecified type diabetes mellitus with renal manifestations, uncontrolled(250.42)   CAD (coronary artery disease), native coronary artery   Peripheral arterial disease    Time spent: [place], [place] [facility] Pager [phone]. If 7PM-7AM, please contact night-coverage at [email or web address], password [facility] 05/31/2013, 10:15 AM  LOS: 2 days

## 2013-05-31 NOTE — Progress Notes (Signed)
Pt currently resting comfortably sleeping on her left side.  Call bell within reach VS stable. Will continue to monitor.

## 2013-05-31 NOTE — Consult Note (Signed)
WOC consult Note Reason for Consult:left foot, second (2nd) digit necrosis Wound type:vascular (arterial) insufficiency vs infectious Pressure Ulcer POA: No Measurement: Second digit of left foot is necrotic, dry Wound [id number] to evaluate Drainage (amount, consistency, odor) None Periwound:left great toe and 3rd digit are unaffected. Dressing procedure/placement/frequency:I will suggest consultation with [facility] for definitive diagnostic studies as indicated and implement a conservative POC using dry dressings. I will not follow.  Please re-consult if needed. Thanks, Maudie Flakes, MSN, RN, [place], [place], [place] [phone])

## 2013-06-01 ENCOUNTER — Inpatient Hospital Stay (HOSPITAL_COMMUNITY): Payer: Medicare Other

## 2013-06-01 DIAGNOSIS — N179 Acute kidney failure, unspecified: Secondary | ICD-10-CM

## 2013-06-01 LAB — CBC
HCT: 23.2 % — ABNORMAL LOW (ref 36.0–46.0)
Hemoglobin: 7.6 g/dL — ABNORMAL LOW (ref 12.0–15.0)
MCH: 27.5 pg (ref 26.0–34.0)
MCHC: 32.8 g/dL (ref 30.0–36.0)
MCV: 84.1 fL (ref 78.0–100.0)

## 2013-06-01 LAB — GLUCOSE, CAPILLARY
Glucose-Capillary: 173 mg/dL — ABNORMAL HIGH (ref 70–99)
Glucose-Capillary: 155 mg/dL — ABNORMAL HIGH (ref 70–99)

## 2013-06-01 LAB — BASIC METABOLIC PANEL
BUN: 73 mg/dL — ABNORMAL HIGH (ref 6–23)
Calcium: 8.5 mg/dL (ref 8.4–10.5)
Creatinine, Ser: 5.53 mg/dL — ABNORMAL HIGH (ref 0.50–1.10)
GFR calc Af Amer: 8 mL/min — ABNORMAL LOW (ref >90)
GFR calc non Af Amer: 8 mL/min — ABNORMAL LOW (ref >90)
GFR calc non Af Amer: 7 mL/min — ABNORMAL LOW (ref >90)
Glucose, Bld: 178 mg/dL — ABNORMAL HIGH (ref 70–99)
Glucose, Bld: 148 mg/dL — ABNORMAL HIGH (ref 70–99)
Potassium: 4.9 mEq/L (ref 3.5–5.1)
Sodium: 131 mEq/L — ABNORMAL LOW (ref 135–145)

## 2013-06-01 LAB — BLOOD GAS, ARTERIAL
Acid-base deficit: 6.9 mmol/L — ABNORMAL HIGH (ref 0.0–2.0)
TCO2: 19.2 mmol/L (ref 0–100)
pCO2 arterial: 36 mmHg (ref 35.0–45.0)

## 2013-06-01 LAB — PREPARE RBC (CROSSMATCH)

## 2013-06-01 LAB — LACTIC ACID, PLASMA: Lactic Acid, Venous: 1 mmol/L (ref 0.5–2.2)

## 2013-06-01 MED ORDER — SODIUM CHLORIDE 0.9 % IV BOLUS (SEPSIS)
1000.0000 mL | Freq: Once | INTRAVENOUS | Status: AC
  Administered 2013-06-01: 1000 mL via INTRAVENOUS

## 2013-06-01 MED ORDER — IBUPROFEN 800 MG PO TABS
800.0000 mg | ORAL_TABLET | Freq: Once | ORAL | Status: AC
  Administered 2013-06-02: 800 mg via ORAL
  Filled 2013-06-01 (×2): qty 1

## 2013-06-01 MED ORDER — PIPERACILLIN-TAZOBACTAM IN DEX 2-0.25 GM/50ML IV SOLN
2.2500 g | Freq: Three times a day (TID) | INTRAVENOUS | Status: AC
  Administered 2013-06-01 – 2013-06-09 (×24): 2.25 g via INTRAVENOUS
  Filled 2013-06-01 (×28): qty 50

## 2013-06-01 MED ORDER — INSULIN GLARGINE 100 UNIT/ML ~~LOC~~ SOLN: 24.0000 [IU] | Freq: Every day | SUBCUTANEOUS | Status: DC

## 2013-06-01 NOTE — Progress Notes (Signed)
Came to recheck Patient- UOP improved with IVF bolus. Patient more awake, family updated x 3  Eulogio Bear DO

## 2013-06-01 NOTE — Progress Notes (Signed)
Pt's temp this am is 102.9 orally. Extra blankets removed off pt. PRN Tylenol given as ordered. MD on call made aware. New orders given. Will cont to monitor closely. Cornell Barman

## 2013-06-01 NOTE — Progress Notes (Addendum)
[facility] PROGRESS NOTE  [name] [id number] DOB: [birth year] DOA: 05/29/2013 PCP: Hayden Rasmussen., MD  Assessment/Plan: Lethargy: hold neurontin; ABG and consider MRI as CT scan on 12th did not show new CVA  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, IV abx, blood cultures, follow- Dr. Gwenlyn Found to see on Monday  Anemia of chronic disease - transfuse when < 7 (CAD but no active CP)- 2 units on 7/13  Type II or unspecified type diabetes mellitus with renal manifestations, uncontrolled- SSI and hold home lantus as Cr has increased  CAD (coronary artery disease), native coronary artery- stable, no CP   Peripheral arterial disease   AKI on CKD- IVF, hold BP meds- ? From low BP vs neurotoxic agents (vanc) + uncontrolled DM-  renal U/S ok-- baseline about 1.7- bolus IVF- check BMp later and consult nephro if not better-- will need vanc level- speak with pharmacy  H/o CVA  May need transfer to [facility] if vital signs become unstable   Code Status: full Family Communication: patient at bedside- daughters x3  Phone/person Disposition Plan: inpt   Consultants:  Gwenlyn Found- Monday  Procedures:  none  Antibiotics:  vanc/zosyn  HPI/Subjective: Harder to wak up today - will answer questions appropriately  Objective: Filed Vitals:   05/31/13 2307 06/01/13 0602 06/01/13 0745 06/01/13 0751  BP: 118/48 118/48    Pulse: 72 74  82  Temp: 98.5 F (36.9 C) 102.9 F (39.4 C) 99.8 F (37.7 C)   TempSrc: Oral Oral Oral   Resp: 16 18    SpO2: 99% 95%      Intake/Output Summary (Last 24 hours) at 06/01/13 1246 Last data filed at 06/01/13 0641  Gross per 24 hour  Intake    970 ml  Output      0 ml  Net    970 ml   There were no vitals filed for this visit.  Exam:   General:  sleepy  Cardiovascular: rrr  Respiratory: clear anterior  Abdomen: +BS, soft, NT  Musculoskeletal: moves all 4ext - toes on left foot is necrotic  Data Reviewed: Basic Metabolic  Panel:  Recent Labs Lab 05/29/13 2116 05/30/13 0405 06/01/13 0535 06/01/13 1126  NA 133* 135 130* 131*  K 4.3 4.1 4.9 4.9  CL 100 100 96 98  CO2 25 23 20 20   GLUCOSE 241* 356* 178* 148*  BUN 53* 53* 73* 73*  CREATININE 2.49* 2.56* 5.53* 5.87*  CALCIUM 8.8 8.6 8.5 8.5   Liver Function Tests: No results found for this basename: AST, ALT, ALKPHOS, BILITOT, PROT, ALBUMIN,  in the last 168 hours No results found for this basename: LIPASE, AMYLASE,  in the last 168 hours No results found for this basename: AMMONIA,  in the last 168 hours CBC:  Recent Labs Lab 05/29/13 2116 05/30/13 0405 05/31/13 0525 06/01/13 0535  WBC 12.0* 10.7* 11.8* 12.1*  NEUTROABS 8.5*  --   --   --   HGB 7.9* 7.4* 6.5* 7.6*  HCT 23.8* 22.4* 19.6* 23.2*  MCV 83.5 84.5 84.8 84.1  PLT 198 189 176 194   Cardiac Enzymes: No results found for this basename: CKTOTAL, CKMB, CKMBINDEX, TROPONINI,  in the last 168 hours BNP (last 3 results)  Recent Labs  09/25/12 1250  PROBNP 1010.0*   CBG:  Recent Labs Lab 05/31/13 1217 05/31/13 1623 05/31/13 2300 06/01/13 0601 06/01/13 1116  GLUCAP 193* 197* 270* 169* 140*    Recent Results (from the past 240 hour(s))  CULTURE, BLOOD (  ROUTINE X 2)     Status: None   Collection Time    05/30/13  3:46 PM      Result Value Range Status   Specimen Description BLOOD RIGHT HAND   Final   Special Requests BOTTLES DRAWN AEROBIC ONLY 2CC   Final   Culture  Setup Time 05/30/2013 22:14   Final   Culture     Final   Value:        BLOOD CULTURE RECEIVED NO GROWTH TO DATE CULTURE WILL BE HELD FOR 5 DAYS BEFORE ISSUING A FINAL NEGATIVE REPORT   Report Status PENDING   Incomplete     Studies: Ct Head Wo Contrast  05/30/2013   *RADIOLOGY REPORT*  Clinical Data: Left-sided weakness.  CT HEAD WITHOUT CONTRAST  Technique:  Contiguous axial images were obtained from the base of the skull through the vertex without contrast.  Comparison: 03/19/2013.  Findings: Large, old  right cerebellar hemisphere infarct.  Old right occipital lobe infarct.  Patchy white matter low density in both cerebral hemispheres.  Minimally enlarged ventricles and subarachnoid spaces.  No intracranial hemorrhage, mass lesion or CT evidence of acute infarction.  Unremarkable bones and paranasal sinuses.  Stable left frontal probable venous lake.  IMPRESSION:  1.  No acute abnormality. 2.  Old infarcts, chronic small vessel white matter ischemic changes and minimal atrophy, as described above.   Original Report Authenticated By: Claudie Revering, M.D.   US Renal  06/01/2013   *RADIOLOGY REPORT*  Clinical Data: Acute renal failure chronic renal disease.  RENAL/URINARY TRACT ULTRASOUND COMPLETE  Comparison:  CT abdomen dated 11/27/2010  Findings: The study is limited by technique and body habitus.  Right Kidney:  11.6 cm in length.  No hydronephrosis.  Cortical echogenicity is within normal limits.  1.5 cm hypoechoic exophytic lesion from the lower pole of the right kidney is likely a cyst.  Left Kidney:  11.8 cm in length.  No hydronephrosis or mass.  Bladder:  Unremarkable.  IMPRESSION: Probable benign cyst in the lower pole of the right kidney.  No evidence of hydronephrosis.   Original Report Authenticated By: Marybelle Killings, M.D.    Scheduled Meds: . aspirin EC  81 mg Oral Daily  . atorvastatin  40 mg Oral q1800  . carvedilol  12.5 mg Oral BID WC  . insulin aspart  0-15 Units Subcutaneous TID WC  . insulin aspart  0-5 Units Subcutaneous QHS  . omega-3 acid ethyl esters  2 g Oral Daily  . piperacillin-tazobactam (ZOSYN)  IV  3.375 g Intravenous Q8H  . sodium chloride  1,000 mL Intravenous Once  . vancomycin  1,500 mg Intravenous Q48H   Continuous Infusions: . sodium chloride      Principal Problem:   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 Active Problems:   Anemia of chronic disease   Type II or unspecified type diabetes mellitus with renal manifestations, uncontrolled(250.42)   CAD  (coronary artery disease), native coronary artery   Peripheral arterial disease    Time spent: [place], [place] [facility] Pager [phone]. If 7PM-7AM, please contact night-coverage at [email or web address], password [facility] 06/01/2013, 12:46 PM  LOS: 3 days

## 2013-06-01 NOTE — Progress Notes (Signed)
ANTIBIOTIC CONSULT NOTE - FOLLOW UP  Pharmacy Consult for Vancomycin and Zosyn Indication: diabetic foot infection (2nd toe)  No Known Allergies  Patient Measurements:   Weight ~ 107 kg Height ~ 62 inches   Vital Signs: Temp: 98.2 F (36.8 C) (07/14 1300) Temp src: Oral (07/14 1300) BP: 112/63 mmHg (07/14 1300) Pulse Rate: 102 (07/14 1300) Intake/Output from previous day: 07/13 0701 - 07/14 0700 In: 1220 [P.O.:120; I.V.:500; IV Piggyback:600] Out: -  Intake/Output from this shift:    Labs:  Recent Labs  05/30/13 0405 05/31/13 0525 06/01/13 0535 06/01/13 1126  WBC 10.7* 11.8* 12.1*  --   HGB 7.4* 6.5* 7.6*  --   PLT 189 176 194  --   CREATININE 2.56*  --  5.53* 5.87*   CrCl ~ 10 ml/min   Microbiology: Recent Results (from the past 720 hour(s))  CULTURE, BLOOD (ROUTINE X 2)     Status: None   Collection Time    05/30/13  3:46 PM      Result Value Range Status   Specimen Description BLOOD RIGHT HAND   Final   Special Requests BOTTLES DRAWN AEROBIC ONLY 2CC   Final   Culture  Setup Time 05/30/2013 22:14   Final   Culture     Final   Value:        BLOOD CULTURE RECEIVED NO GROWTH TO DATE CULTURE WILL BE HELD FOR 5 DAYS BEFORE ISSUING A FINAL NEGATIVE REPORT   Report Status PENDING   Incomplete    Anti-infectives   Start     Dose/Rate Route Frequency Ordered Stop   06/01/13 0000  vancomycin (VANCOCIN) 1,500 mg in sodium chloride 0.9 % 500 mL IVPB     1,500 mg 250 mL/hr over 120 Minutes Intravenous Every 48 hours 05/30/13 0054     05/30/13 0600  piperacillin-tazobactam (ZOSYN) IVPB 3.375 g     3.375 g 12.5 mL/hr over 240 Minutes Intravenous 3 times per day 05/30/13 0249     05/30/13 0100  vancomycin (VANCOCIN) 2,000 mg in sodium chloride 0.9 % 500 mL IVPB     2,000 mg 250 mL/hr over 120 Minutes Intravenous  Once 05/30/13 0029 05/30/13 0331   05/30/13 0015  piperacillin-tazobactam (ZOSYN) IVPB 3.375 g     3.375 g 12.5 mL/hr over 240 Minutes Intravenous   Once 05/30/13 0004 05/30/13 0101      Assessment: 62 yo F admitted 05/29/13 with several week history of toe infection not improving on oral antibiotics.  Surgery consult pending.  Noted acute increase in SCr overnight with SCr 2.65>5.87.  CrCl ~ 10 ml/min.  MD suspects AKI related to episode of hypotension and poor kidney perfusion as opposed to nephrotoxic medications.  Last Vancomycin dose this AM at 0200, next scheduled dose is 7/15 at midnight.  Goal of Therapy:  Vancomycin trough level 10-15 mcg/ml Renal dose adjustment of medications.  Plan:  Change Zosyn to 2.25gm IV q8h. Will not change Vancomycin at this time since dose is not due until tomorrow evening.  Will follow SCr trend with tomorrow labs and adjust Vancomycin at that time. Follow WBC, fever, culture data, and clinical course.   [employer], Pharm.D., BCPS Clinical Pharmacist Pager [phone] 06/01/2013 2:01 PM

## 2013-06-01 NOTE — Evaluation (Addendum)
Physical Therapy Evaluation Patient Details Name: [name] MRN: [id number] DOB: [birth year] Today's Date: 06/01/2013 Time: [id number] PT Time Calculation (min): 26 min  PT Assessment / Plan / Recommendation History of Present Illness  62 yo female h/o dm, pvd, cad comes in with fighting left toe infection for weeks; Relatively recent CVA  Clinical Impression  Pt admitted with necrotic L second toe/infection. Pt currently with functional limitations due to the deficits listed below (see PT Problem List).  Pt will benefit from skilled PT to increase their independence and safety with mobility to allow discharge to the venue listed below.        PT Assessment  Patient needs continued PT services    Follow Up Recommendations  SNF;Supervision/Assistance - 24 hour    Does the patient have the potential to tolerate intense rehabilitation      Barriers to Discharge Decreased caregiver support [name] questions whether family can assist her at this functional level    Equipment Recommendations  Wheelchair (measurements PT);Wheelchair cushion (measurements PT);Hospital bed    Recommendations for Other Services Speech consult   Frequency Min 3X/week    Precautions / Restrictions Restrictions Weight Bearing Restrictions: No   Pertinent Vitals/Pain no apparent distress Noted decr O2 sats to mid 80s on Room Air; incr to 94% on 2 L O2      Mobility  Bed Mobility Bed Mobility: Rolling Right;Rolling Left;Supine to Sit;Sitting - Scoot to [place] of Bed;Scooting to [facility];Sit to Supine Rolling Right: 4: Min assist;With rail Rolling Left: 4: Min assist;With rail Supine to Sit: 1: +2 Total assist;With rails Supine to Sit: Patient Percentage: 40% Sitting - Scoot to Edge of Bed: 1: +2 Total assist Sitting - Scoot to Edge of Bed: Patient Percentage: 40% Sit to Supine: 1: +2 Total assist Sit to Supine: Patient Percentage: 20% Scooting to HOB: 3: Mod assist Details for Bed Mobility  Assistance: Performed rolling and scooting to [facility] quite well with rails; Required much more assist with supine<> sit, with anti-gravity assist for trunk elevation and to assist LEs back to bed Transfers Transfers: Sit to Stand;Stand to Sit;Lateral/Scoot Transfers Sit to Stand: 1: +2 Total assist;From bed Sit to Stand: Patient Percentage: 0% Lateral/Scoot Transfers: 1: +2 Total assist (lateral scooting at EOB) Lateral Transfers: Patient Percentage: 0% Details for Transfer Assistance: No noted hip/knee muscle recruitment with sit to stand attempts; Unable to lift hips off of bed, even with maximal bilateral knee blocking; question if this is a pt participation/effort issue    Exercises     PT Diagnosis: Generalized weakness  PT Problem List: Decreased strength;Decreased range of motion;Decreased activity tolerance;Decreased balance;Decreased knowledge of use of DME;Decreased safety awareness;Cardiopulmonary status limiting activity PT Treatment Interventions: DME instruction;Gait training;Functional mobility training;Therapeutic activities;Therapeutic exercise;Balance training;Neuromuscular re-education;Cognitive remediation;Patient/family education;Wheelchair mobility training     PT Goals(Current goals can be found in the care plan section) Acute Rehab PT Goals Patient Stated Goal: did not state PT Goal Formulation: With patient Time For Goal Achievement: 06/15/13 Potential to Achieve Goals: Fair  Visit Information  Last PT Received On: 06/01/13 Assistance Needed: +3 or more PT/OT Co-Evaluation/Treatment: Yes History of Present Illness: 62 yo female h/o dm, pvd, cad comes in with fighting left toe infection for weeks; Relatively recent CVA       Prior Functioning  Home Living Family/patient expects to be discharged to:: Private residence Living Arrangements: Children Available Help at Discharge: Family Type of Home: House Home Access: Stairs to enter [employer] of  Steps: 4 at  side entrance  Entrance Stairs-Rails: None Home Layout: Two level;Bed/bath upstairs Alternate Level Stairs-Number of Steps: 7 Alternate Level Stairs-Rails: Left Home Equipment: Walker - 2 wheels Prior Function Level of Independence: Needs assistance Gait / Transfers Assistance Needed: A for tub transfer ADL's / Homemaking Assistance Needed: Mod A for bathing and dressing.  Communication Communication: No difficulties Dominant Hand: Right    Cognition  Cognition Arousal/Alertness: Lethargic Overall Cognitive Status: Impaired/Different from baseline Area of Impairment: Attention (decr arousal) Current Attention Level: Sustained General Comments: A bit mroe aroused once sitting EOB    Extremity/Trunk Assessment Upper Extremity Assessment Upper Extremity Assessment: RUE deficits/detail RUE Deficits / Details: Weakness-Pt unable to perform full AROM of right shoulder.  Lower Extremity Assessment Lower Extremity Assessment: Generalized weakness;RLE deficits/detail;LLE deficits/detail RLE Deficits / Details: Noted discrepancy in use/movement of LEs as pt volitionallly moves LEs in bed, and showed at least 3/5 strength in quads sitting EOB, but she had no noted muscle activation in LEs in response to translating center of mass over feet LLE Deficits / Details: Noted discrepancy in use/movement of LEs as pt volitionallly moves LEs in bed, and showed at least 3/5 strength in quads sitting EOB, but she had no noted muscle activation in LEs in response to translating center of mass over feet   Balance Balance Balance Assessed: Yes Static Sitting Balance Static Sitting - Balance Support: Right upper extremity supported;Left upper extremity supported Static Sitting - Level of Assistance: 3: Mod assist;4: Min assist Static Sitting - Comment/# of Minutes: Sat EOB for further assessment of UEs and LEs; pt progressed from needing mod assist secondary to posterior lean to min assist  End  of Session PT - End of Session Equipment Utilized During Treatment: Oxygen (bed pad) Activity Tolerance: Patient limited by lethargy (question amount of participation) Patient left: in bed;with call bell/phone within reach;with family/visitor present (bed in semi-chair position) Nurse Communication: Mobility status;Need for lift equipment  GP     Roney Marion [facility] [place], [place]   06/01/2013, 5:00 PM

## 2013-06-01 NOTE — Plan of Care (Signed)
Problem: Phase I Progression Outcomes Goal: OOB as tolerated unless otherwise ordered Outcome: Progressing Patient continues to have weakness and is lethargic.  Will remain in bed this shift with bed alarm activated.  Patient has not tried to get OOB.  NS at 140ml/hr continues.  Tylenol and IBU PO given for low grade fever.  Will continue to monitor patient condition.

## 2013-06-01 NOTE — Evaluation (Signed)
Occupational Therapy Evaluation Patient Details Name: [name] MRN: [id number] DOB: Oct 11, 1951 Today's Date: 06/01/2013 Time: [id number] OT Time Calculation (min): 26 min  OT Assessment / Plan / Recommendation History of present illness 62 yo female h/o dm, pvd, cad comes in with fighting left toe infection for weeks; Relatively recent CVA   Clinical Impression   Pt admitted with necrotic L second toe/infection. Pt currently presents with below problem list (see OT Problem List). Pt will benefit from skilled OT to increase their independence and safety to allow discharge to the venue listed below.      OT Assessment  Patient needs continued OT Services    Follow Up Recommendations  SNF;Supervision/Assistance - 24 hour    Barriers to Discharge Decreased caregiver support    Equipment Recommendations  Other (comment) (tbd)    Recommendations for Other Services Speech consult  Frequency  Min 2X/week    Precautions / Restrictions Restrictions Weight Bearing Restrictions: No   Pertinent Vitals/Pain no apparent distress  Noted decr O2 sats to mid 80s on Room Air; incr to 94% on 2 L O2     ADL  Eating/Feeding: Minimal assistance Where Assessed - Eating/Feeding: Edge of bed Grooming: Performed;Wash/dry face;Minimal assistance Where Assessed - Grooming: Supported sitting (UE supported sitting EOB) Upper Body Bathing: Maximal assistance Where Assessed - Upper Body Bathing: Supported sitting (UE supported sitting EOB) Lower Body Bathing: +1 Total assistance Where Assessed - Lower Body Bathing: Supine, head of bed up;Supine, head of bed flat;Rolling right and/or left Upper Body Dressing: Maximal assistance Where Assessed - Upper Body Dressing: Supported sitting (UE supported sitting EOB) Lower Body Dressing: +1 Total assistance Where Assessed - Lower Body Dressing: Supine, head of bed up;Supine, head of bed flat;Rolling right and/or left Toilet Transfer: +2 Total  assistance Toilet Transfer: Patient Percentage: 0% Toilet Transfer Method: Sit to stand (unable to complete) Science writer: Other (comment) (attempted from bed) Tub/Shower Transfer Method: Not assessed Equipment Used: Gait belt;Other (comment) (oxygen) Transfers/Ambulation Related to ADLs: +2 total A ADL Comments: Pt overall Max A/Total A for ADLs. Pt lethargic during session.  Unable to complete sit to stand transfer. Pt washed face sitting EOB with Min A for balance.    OT Diagnosis: Generalized weakness  OT Problem List: Decreased strength;Decreased activity tolerance;Impaired balance (sitting and/or standing);Decreased knowledge of use of DME or AE;Decreased range of motion;Decreased safety awareness;Cardiopulmonary status limiting activity OT Treatment Interventions: Self-care/ADL training;Therapeutic exercise;DME and/or AE instruction;Therapeutic activities;Patient/family education;Balance training   OT Goals(Current goals can be found in the care plan section) Acute Rehab OT Goals Patient Stated Goal: did not state OT Goal Formulation: With patient Time For Goal Achievement: 06/08/13 Potential to Achieve Goals: Fair ADL Goals Pt Will Perform Grooming: with min assist;standing Pt Will Perform Upper Body Bathing: with supervision;sitting;with set-up Pt Will Perform Lower Body Bathing: with mod assist;sit to/from stand Pt Will Perform Upper Body Dressing: with supervision;with set-up;sitting Pt Will Perform Lower Body Dressing: with mod assist;sit to/from stand Pt Will Transfer to Toilet: with min assist;bedside commode Pt Will Perform Toileting - Clothing Manipulation and hygiene: with min assist;sit to/from stand  Visit Information  Last OT Received On: 06/01/13 Assistance Needed: +3 or more PT/OT Co-Evaluation/Treatment: Yes History of Present Illness: 62 yo female h/o dm, pvd, cad comes in with fighting left toe infection for weeks; Relatively recent CVA        Prior Functioning     Home Living Family/patient expects to be discharged to:: Private residence Living Arrangements: Children  Available Help at Discharge: Family Type of Home: House Home Access: Stairs to enter [employer] of Steps: 4 at side entrance  Entrance Stairs-Rails: None Home Layout: Two level;Bed/bath upstairs Alternate Level Stairs-Number of Steps: 7 Alternate Level Stairs-Rails: Left Home Equipment: Walker - 2 wheels Prior Function Level of Independence: Needs assistance Gait / Transfers Assistance Needed: A for tub transfer ADL's / Homemaking Assistance Needed: Mod A for bathing and dressing.  Communication Communication: No difficulties Dominant Hand: Right         Vision/Perception     Cognition  Cognition Arousal/Alertness: Lethargic Overall Cognitive Status: Impaired/Different from baseline Area of Impairment: Attention (decreased arousal) Current Attention Level: Sustained General Comments: A bit more aroused once sitting EOB    Extremity/Trunk Assessment Upper Extremity Assessment Upper Extremity Assessment: RUE deficits/detail RUE Deficits / Details: Weakness-Pt unable to perform full AROM of right shoulder.      Mobility Bed Mobility Bed Mobility: Rolling Right;Rolling Left;Supine to Sit;Sitting - Scoot to [place] of Bed;Scooting to [facility];Sit to Supine Rolling Right: 4: Min assist;With rail Rolling Left: 4: Min assist;With rail Supine to Sit: 1: +2 Total assist;With rails Supine to Sit: Patient Percentage: 40% Sitting - Scoot to Edge of Bed: 1: +2 Total assist Sitting - Scoot to Edge of Bed: Patient Percentage: 40% Sit to Supine: 1: +2 Total assist Sit to Supine: Patient Percentage: 20% Scooting to HOB: 3: Mod assist Details for Bed Mobility Assistance: Performed rolling and scooting to [facility] quite well with rails; Required much more assist with supine<> sit, with anti-gravity assist for trunk elevation and to assist LEs back to  bed Transfers Transfers: Sit to Stand Sit to Stand: 1: +2 Total assist;From bed Sit to Stand: Patient Percentage: 0% Details for Transfer Assistance: No noted hip/knee muscle recruitment with sit to stand attempts; Unable to lift hips off of bed, even with maximal bilateral knee blocking; question if this is a pt participation/effort issue     Exercise        End of Session OT - End of Session Equipment Utilized During Treatment: Gait belt;Oxygen Activity Tolerance: Patient limited by fatigue Patient left: in bed;with call bell/phone within reach;with bed alarm set;with family/visitor present Nurse Communication: Mobility status;Need for lift equipment  GO     Benito Mccreedy OTR/L [id number] 06/01/2013, 5:16 PM

## 2013-06-01 NOTE — Progress Notes (Signed)
UR COMPLETED  

## 2013-06-01 NOTE — Consult Note (Addendum)
Reason for Consult: Non Healing Foot Ulcer of the Left Lower Extremity Referring Physician: TRH   HPI: The patient is a 62 y/o female, with a history of CAD, s/p PCI to the RCA for inferior STEMI, DM, HTN and HLD,  followed at [facility] by Dr. Ellyn Hack. Her last office visit was 05/06/13 for evaluation of a non healing ulcer of the 2nd digit of the left lower extremity. Dr. Allison Quarry plan was for referral to see Dr. Gwenlyn Found for consideration for a PV angiogram to define her anatomy. She was instructed to return to our office for bilateral lower extremity doppler studies. However, she had these done at an outside clinic. The findings were suggestive of moderate reduction in arterial blood flow in the left lower extremity, with decreased ABIs. The right lower extremity revealed normal ABIs. She presented to [facility] on 7/13 with a complaint of worsening coloration of her toe. On arrival, the toe was noted to be necrotic. She has mild pain with ambulation. No drainage. She endorses fever and chills.   Past Medical History  Diagnosis Date  . ST elevation myocardial infarction (STEMI) of inferior wall  03/2009    Ostial/proximal RCA occlusion treated with BMS stent  . CAD (coronary artery disease), native coronary artery      status post PCI to the RCA for inferior STEMI  . Cancer     endo ca  . Hypertension associated with diabetes   . Hyperlipidemia LDL goal <70   . Diabetes mellitus, type II, insulin dependent     With complications - CAD, CVA, peripheral ulcer ,  . Stroke, acute, within 8 weeks  4/30/ 2014    Right-sided weakness, mostly with balance issues and only mild weakness.  . Diabetic peripheral neuropathy associated with type 2 diabetes mellitus   . Obesity, Class III, BMI 40-49.9 (morbid obesity)     BMI 43; 5' 2',  235 pounds 6.4 ounces  . CKD (chronic kidney disease), stage III     Past Surgical History  Procedure Laterality Date  . Abdominal hysterectomy    . Leg tendon surgery     patient fell in a store right leg  . Leg surgery      hole in bone( during Childhood)  . Cardiac catheterization  03/31/2009  . Coronary angioplasty  03/31/2009    PTCA , bare-metal stent 3.5 mm x 24 mm ostium of RCA ,EF 55%    Family History  Problem Relation Age of Onset  . Alcoholism Mother     Died from complications  . Alcoholism Father     Died from complications  . Heart disease      Unknown, unclear. Patient is not a good historian.  Essentially no pertinent history known    Social History:  reports that she quit smoking about 18 years ago. She has never used smokeless tobacco. She reports that she does not drink alcohol or use illicit drugs.  Allergies: No Known Allergies  Medications:  Prior to Admission:  Prescriptions prior to admission  Medication Sig Dispense Refill  . amLODipine (NORVASC) 5 MG tablet Take 1 tablet (5 mg total) by mouth daily at 12 noon.      Marland Kitchen aspirin EC 81 MG tablet Take 81 mg by mouth daily.      Marland Kitchen atorvastatin (LIPITOR) 40 MG tablet Take 1 tablet (40 mg total) by mouth daily at 6 PM.      . carvedilol (COREG) 12.5 MG tablet Take 12.5 mg by mouth 2 (  two) times daily with a meal.      . gabapentin (NEURONTIN) 100 MG capsule Take 100 mg by mouth 3 (three) times daily.      . insulin glargine (LANTUS) 100 UNIT/ML injection Inject 48 Units into the skin at bedtime.       Marland Kitchen losartan-hydrochlorothiazide (HYZAAR) 100-25 MG per tablet Take 1 tablet by mouth daily.      Marland Kitchen omega-3 acid ethyl esters (LOVAZA) 1 G capsule Take 2 g by mouth daily.      . silver sulfADIAZINE (SILVADENE) 1 % cream Apply 1 application topically daily. Apply to toe      . amoxicillin-clavulanate (AUGMENTIN) 875-125 MG per tablet Take 1 tablet by mouth 2 (two) times daily.  20 tablet  0    Results for orders placed during the hospital encounter of 05/29/13 (from the past 48 hour(s))  GLUCOSE, CAPILLARY     Status: Abnormal   Collection Time    05/30/13 11:57 AM      Result  Value Range   Glucose-Capillary 159 (*) 70 - 99 mg/dL  CULTURE, BLOOD (ROUTINE X 2)     Status: None   Collection Time    05/30/13  3:46 PM      Result Value Range   Specimen Description BLOOD RIGHT HAND     Special Requests BOTTLES DRAWN AEROBIC ONLY 2CC     Culture  Setup Time 05/30/2013 22:14     Culture       Value:        BLOOD CULTURE RECEIVED NO GROWTH TO DATE CULTURE WILL BE HELD FOR 5 DAYS BEFORE ISSUING A FINAL NEGATIVE REPORT   Report Status PENDING    GLUCOSE, CAPILLARY     Status: Abnormal   Collection Time    05/30/13  4:49 PM      Result Value Range   Glucose-Capillary 178 (*) 70 - 99 mg/dL  GLUCOSE, CAPILLARY     Status: Abnormal   Collection Time    05/30/13  8:03 PM      Result Value Range   Glucose-Capillary 182 (*) 70 - 99 mg/dL  GLUCOSE, CAPILLARY     Status: Abnormal   Collection Time    05/30/13 10:06 PM      Result Value Range   Glucose-Capillary 229 (*) 70 - 99 mg/dL   Comment 1 Notify RN    CBC     Status: Abnormal   Collection Time    05/31/13  5:25 AM      Result Value Range   WBC 11.8 (*) 4.0 - 10.5 K/uL   Comment: WHITE COUNT CONFIRMED ON SMEAR   RBC 2.31 (*) 3.87 - 5.11 MIL/uL   Hemoglobin 6.5 (*) 12.0 - 15.0 g/dL   Comment: REPEATED TO VERIFY     CRITICAL RESULT CALLED TO, READ BACK BY AND VERIFIED WITH:     PENG H,RN 05/31/13 0655 WAYK   HCT 19.6 (*) 36.0 - 46.0 %   MCV 84.8  78.0 - 100.0 fL   MCH 28.1  26.0 - 34.0 pg   MCHC 33.2  30.0 - 36.0 g/dL   RDW 16.0 (*) 11.5 - 15.5 %   Platelets 176  150 - 400 K/uL   Comment: PLATELET COUNT CONFIRMED BY SMEAR  GLUCOSE, CAPILLARY     Status: Abnormal   Collection Time    05/31/13  7:02 AM      Result Value Range   Glucose-Capillary 174 (*) 70 - 99 mg/dL  TYPE AND SCREEN     Status: None   Collection Time    05/31/13  7:50 AM      Result Value Range   ABO/RH(D) O POS     Antibody Screen NEG     Sample Expiration 06/03/2013     Unit Number [id number]     Blood Component Type RED  CELLS,LR     Unit division 00     Status of Unit ISSUED,FINAL     Transfusion Status OK TO TRANSFUSE     Crossmatch Result Compatible     Unit Number [id number]     Blood Component Type RED CELLS,LR     Unit division 00     Status of Unit DISCARDED     Transfusion Status OK TO TRANSFUSE     Crossmatch Result Compatible     Unit Number [id number]     Blood Component Type RBC LR PHER2     Unit division 00     Status of Unit ISSUED,FINAL     Transfusion Status OK TO TRANSFUSE     Crossmatch Result Compatible    PREPARE RBC (CROSSMATCH)     Status: None   Collection Time    05/31/13  7:50 AM      Result Value Range   Order Confirmation ORDER PROCESSED BY BLOOD BANK    ABO/RH     Status: None   Collection Time    05/31/13  7:50 AM      Result Value Range   ABO/RH(D) O POS    GLUCOSE, CAPILLARY     Status: Abnormal   Collection Time    05/31/13 12:17 PM      Result Value Range   Glucose-Capillary 193 (*) 70 - 99 mg/dL  GLUCOSE, CAPILLARY     Status: Abnormal   Collection Time    05/31/13  4:23 PM      Result Value Range   Glucose-Capillary 197 (*) 70 - 99 mg/dL   Comment 1 Notify RN     Comment 2 Documented in Chart    URINALYSIS, ROUTINE W REFLEX MICROSCOPIC     Status: Abnormal   Collection Time    05/31/13  8:34 PM      Result Value Range   Color, Urine YELLOW  YELLOW   APPearance TURBID (*) CLEAR   Specific Gravity, Urine 1.015  1.005 - 1.030   pH 5.5  5.0 - 8.0   Glucose, UA 100 (*) NEGATIVE mg/dL   Hgb urine dipstick MODERATE (*) NEGATIVE   Bilirubin Urine NEGATIVE  NEGATIVE   Ketones, ur NEGATIVE  NEGATIVE mg/dL   Protein, ur 100 (*) NEGATIVE mg/dL   Urobilinogen, UA 1.0  0.0 - 1.0 mg/dL   Nitrite NEGATIVE  NEGATIVE   Leukocytes, UA LARGE (*) NEGATIVE  URINE MICROSCOPIC-ADD ON     Status: Abnormal   Collection Time    05/31/13  8:34 PM      Result Value Range   Squamous Epithelial / LPF FEW (*) RARE   WBC, UA 7-10  <3 WBC/hpf   RBC / HPF 7-10  <3  RBC/hpf   Bacteria, UA MANY (*) RARE  GLUCOSE, CAPILLARY     Status: Abnormal   Collection Time    05/31/13 11:00 PM      Result Value Range   Glucose-Capillary 270 (*) 70 - 99 mg/dL  CBC     Status: Abnormal   Collection Time    06/01/13  5:35 AM  Result Value Range   WBC 12.1 (*) 4.0 - 10.5 K/uL   RBC 2.76 (*) 3.87 - 5.11 MIL/uL   Hemoglobin 7.6 (*) 12.0 - 15.0 g/dL   HCT 23.2 (*) 36.0 - 46.0 %   MCV 84.1  78.0 - 100.0 fL   MCH 27.5  26.0 - 34.0 pg   MCHC 32.8  30.0 - 36.0 g/dL   RDW 16.2 (*) 11.5 - 15.5 %   Platelets 194  150 - 400 K/uL  BASIC METABOLIC PANEL     Status: Abnormal   Collection Time    06/01/13  5:35 AM      Result Value Range   Sodium 130 (*) 135 - 145 mEq/L   Potassium 4.9  3.5 - 5.1 mEq/L   Chloride 96  96 - 112 mEq/L   CO2 20  19 - 32 mEq/L   Glucose, Bld 178 (*) 70 - 99 mg/dL   BUN 73 (*) 6 - 23 mg/dL   Creatinine, Ser 5.53 (*) 0.50 - 1.10 mg/dL   Calcium 8.5  8.4 - 10.5 mg/dL   GFR calc non Af Amer 8 (*) >90 mL/min   GFR calc Af Amer 9 (*) >90 mL/min   Comment:            The eGFR has been calculated     using the CKD EPI equation.     This calculation has not been     validated in all clinical     situations.     eGFR's persistently     <90 mL/min signify     possible Chronic Kidney Disease.  GLUCOSE, CAPILLARY     Status: Abnormal   Collection Time    06/01/13  6:01 AM      Result Value Range   Glucose-Capillary 169 (*) 70 - 99 mg/dL    Ct Head Wo Contrast  05/30/2013   *RADIOLOGY REPORT*  Clinical Data: Left-sided weakness.  CT HEAD WITHOUT CONTRAST  Technique:  Contiguous axial images were obtained from the base of the skull through the vertex without contrast.  Comparison: 03/19/2013.  Findings: Large, old right cerebellar hemisphere infarct.  Old right occipital lobe infarct.  Patchy white matter low density in both cerebral hemispheres.  Minimally enlarged ventricles and subarachnoid spaces.  No intracranial hemorrhage, mass lesion  or CT evidence of acute infarction.  Unremarkable bones and paranasal sinuses.  Stable left frontal probable venous lake.  IMPRESSION:  1.  No acute abnormality. 2.  Old infarcts, chronic small vessel white matter ischemic changes and minimal atrophy, as described above.   Original Report Authenticated By: Claudie Revering, M.D.    Review of Systems  Constitutional: Positive for fever and chills.  Musculoskeletal: Positive for joint pain.  All other systems reviewed and are negative.   Blood pressure 118/48, pulse 82, temperature 99.8 F (37.7 C), temperature source Oral, resp. rate 18, SpO2 95.00%. Physical Exam  Constitutional: No distress.  Obese   Neck: No JVD present.  Cardiovascular: Normal rate and regular rhythm.  Exam reveals no gallop and no friction rub.   No murmur heard. Pulses:      Radial pulses are 2+ on the right side, and 2+ on the left side.       Dorsalis pedis pulses are 2+ on the right side, and 0 on the left side.  Respiratory: Effort normal and breath sounds normal. No respiratory distress. She has no wheezes. She has no rales.  GI: Soft. Bowel sounds are  normal. She exhibits no distension and no mass. There is no tenderness.  Musculoskeletal: She exhibits no edema.  Skin: Skin is warm and dry. She is not diaphoretic.    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 No discharge. No odor. No palpable left DP. Doppler studies at outside facility confirm decreased atrial flow of the LLE. She was febrile overnight with a temp of 102.9. + Leukocytosis. She is on IV antibiotics. Most recent temp was 99.8. A plain film of the left foot  demonstrated no evidence of osteomyelitis. Dr. Gwenlyn Found to assess. Consider PV angiogram in an attempt to restore blood flow. This will need to be delayed however, until ARI is resolved. SCr today is 5.53. She is currently not on an ACE-I, ARB or Lasix. Renal ultrasound showed no evidence of hydronephrosis.   Consuelo Pandy, PA-C 06/01/2013, 8:28 AM   Agree with note written by Ellen Henri  [facility]  ATSP with known CAD, + CRF, and CLI. She has a necrotic left 2nd toe which she has had for several months. Apparently she had LEAs in [place] recently but the results are unavailable to me. There are complicating factors including increased WBCs, hypotension, profound anemia and ARF with SCr in the 5-6 range (baseline 1.17). On exam she has absent left pedal pulses but a 2+ left popliteal pulse. She has 2+ RPP. I suspect that her toe will 'auto amputate' and doubt that if she has a surgical amputation it will heal. Ideally  she should have an angio with potential percutaneous revascularization but I can't do this in the setting of ARF. Will get LEAs here and follow with you. Needs transfusion PRBCs.  Lorretta Harp 06/01/2013 5:03 PM

## 2013-06-02 DIAGNOSIS — I10 Essential (primary) hypertension: Secondary | ICD-10-CM

## 2013-06-02 DIAGNOSIS — N189 Chronic kidney disease, unspecified: Secondary | ICD-10-CM

## 2013-06-02 DIAGNOSIS — I739 Peripheral vascular disease, unspecified: Secondary | ICD-10-CM

## 2013-06-02 LAB — NA AND K (SODIUM & POTASSIUM), RAND UR
Potassium Urine: 59 mEq/L
Sodium, Ur: 15 mEq/L

## 2013-06-02 LAB — GLUCOSE, CAPILLARY
Glucose-Capillary: 141 mg/dL — ABNORMAL HIGH (ref 70–99)
Glucose-Capillary: 177 mg/dL — ABNORMAL HIGH (ref 70–99)

## 2013-06-02 LAB — URINE CULTURE: Culture: NO GROWTH

## 2013-06-02 LAB — CBC
HCT: 23 % — ABNORMAL LOW (ref 36.0–46.0)
Platelets: 184 10*3/uL (ref 150–400)
RDW: 16.8 % — ABNORMAL HIGH (ref 11.5–15.5)
WBC: 10.2 10*3/uL (ref 4.0–10.5)

## 2013-06-02 LAB — BASIC METABOLIC PANEL
Chloride: 100 mEq/L (ref 96–112)
GFR calc Af Amer: 7 mL/min — ABNORMAL LOW (ref >90)
Potassium: 4.7 mEq/L (ref 3.5–5.1)

## 2013-06-02 MED ORDER — DARBEPOETIN ALFA-POLYSORBATE 100 MCG/0.5ML IJ SOLN
100.0000 ug | INTRAMUSCULAR | Status: DC
  Administered 2013-06-03: 100 ug via SUBCUTANEOUS
  Filled 2013-06-02 (×3): qty 0.5

## 2013-06-02 NOTE — Progress Notes (Addendum)
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 From low BP- renal U/S ok-- baseline about 1.7- may need foley for better i/os as patient is incontinent of urine- neurontin d/c'd; appreciate renal consult  H/o CVA  Lethargy- d/c neurontin- improving   Code Status: full Family Communication: patient at bedside- spoke with daughter today Disposition Plan: inpt   Consultants:  Gwenlyn Found  Procedures:  none  Antibiotics:  vanc/zosyn  HPI/Subjective: More awake today Answers all questions- gave me daughter's phone number  Objective: Filed Vitals:   06/01/13 2209 06/01/13 2351 06/02/13 0200 06/02/13 0603  BP:    122/66  Pulse:    70  Temp: 100.7 F (38.2 C) 100.7 F (38.2 C) 99.8 F (37.7 C) 99.2 F (37.3 C)  TempSrc: Oral Oral Oral Oral  Resp:    16  SpO2:    97%    Intake/Output Summary (Last 24 hours) at 06/02/13 0947 Last data filed at 06/02/13 0645  Gross per 24 hour  Intake 2052.5 ml  Output      0 ml  Net 2052.5 ml   There were no vitals filed for this visit.  Exam:   General:  A+Ox3, NAD- flat affect  Cardiovascular: rrr  Respiratory: clear anterior  Abdomen: +BS, soft, NT  Musculoskeletal: moves all 4ext   Left toe 2nd: necrotic and dry  Data Reviewed: Basic Metabolic Panel:  Recent Labs Lab 05/29/13 2116 05/30/13 0405 06/01/13 0535 06/01/13 1126 06/02/13 0620  NA 133* 135 130* 131* 133*  K 4.3 4.1 4.9 4.9  4.7  CL 100 100 96 98 100  CO2 25 23 20 20  16*  GLUCOSE 241* 356* 178* 148* 132*  BUN 53* 53* 73* 73* 77*  CREATININE 2.49* 2.56* 5.53* 5.87* 6.50*  CALCIUM 8.8 8.6 8.5 8.5 8.0*   Liver Function Tests: No results found for this basename: AST, ALT, ALKPHOS, BILITOT, PROT, ALBUMIN,  in the last 168 hours No results found for this basename: LIPASE, AMYLASE,  in the last 168 hours  Recent Labs Lab 06/01/13 1942  AMMONIA 23   CBC:  Recent Labs Lab 05/29/13 2116 05/30/13 0405 05/31/13 0525 06/01/13 0535 06/02/13 0620  WBC 12.0* 10.7* 11.8* 12.1* 10.2  NEUTROABS 8.5*  --   --   --   --   HGB 7.9* 7.4* 6.5* 7.6* 7.4*  HCT 23.8* 22.4* 19.6* 23.2* 23.0*  MCV 83.5 84.5 84.8 84.1 86.1  PLT 198 189 176 194 184   Cardiac Enzymes: No results found for this basename: CKTOTAL, CKMB, CKMBINDEX, TROPONINI,  in the last 168 hours BNP (last 3 results)  Recent Labs  09/25/12 1250  PROBNP 1010.0*   CBG:  Recent Labs Lab 06/01/13 0601 06/01/13 1116 06/01/13 1615 06/01/13 2105 06/02/13 0641  GLUCAP 169* 140* 173* 155* 141*    Recent Results (from the past 240 hour(s))  CULTURE, BLOOD (ROUTINE X 2)     Status: None   Collection Time    05/30/13  3:46 PM  Result Value Range Status   Specimen Description BLOOD RIGHT HAND   Final   Special Requests BOTTLES DRAWN AEROBIC ONLY 2CC   Final   Culture  Setup Time 05/30/2013 22:14   Final   Culture     Final   Value:        BLOOD CULTURE RECEIVED NO GROWTH TO DATE CULTURE WILL BE HELD FOR 5 DAYS BEFORE ISSUING A FINAL NEGATIVE REPORT   Report Status PENDING   Incomplete  URINE CULTURE     Status: None   Collection Time    05/31/13  8:34 PM      Result Value Range Status   Specimen Description URINE, RANDOM   Final   Special Requests NONE   Final   Culture  Setup Time 06/01/2013 02:48   Final   Colony Count NO GROWTH   Final   Culture NO GROWTH   Final   Report Status 06/02/2013 FINAL   Final  CULTURE, BLOOD (ROUTINE X 2)      Status: None   Collection Time    06/01/13  7:30 AM      Result Value Range Status   Specimen Description BLOOD LEFT ARM   Final   Special Requests BOTTLES DRAWN AEROBIC ONLY 10CC   Final   Culture  Setup Time 06/01/2013 14:17   Final   Culture     Final   Value:        BLOOD CULTURE RECEIVED NO GROWTH TO DATE CULTURE WILL BE HELD FOR 5 DAYS BEFORE ISSUING A FINAL NEGATIVE REPORT   Report Status PENDING   Incomplete  CULTURE, BLOOD (ROUTINE X 2)     Status: None   Collection Time    06/01/13  7:35 AM      Result Value Range Status   Specimen Description BLOOD LEFT HAND   Final   Special Requests BOTTLES DRAWN AEROBIC ONLY 10CC   Final   Culture  Setup Time 06/01/2013 14:16   Final   Culture     Final   Value:        BLOOD CULTURE RECEIVED NO GROWTH TO DATE CULTURE WILL BE HELD FOR 5 DAYS BEFORE ISSUING A FINAL NEGATIVE REPORT   Report Status PENDING   Incomplete     Studies: US Renal  06/01/2013   *RADIOLOGY REPORT*  Clinical Data: Acute renal failure chronic renal disease.  RENAL/URINARY TRACT ULTRASOUND COMPLETE  Comparison:  CT abdomen dated 11/27/2010  Findings: The study is limited by technique and body habitus.  Right Kidney:  11.6 cm in length.  No hydronephrosis.  Cortical echogenicity is within normal limits.  1.5 cm hypoechoic exophytic lesion from the lower pole of the right kidney is likely a cyst.  Left Kidney:  11.8 cm in length.  No hydronephrosis or mass.  Bladder:  Unremarkable.  IMPRESSION: Probable benign cyst in the lower pole of the right kidney.  No evidence of hydronephrosis.   Original Report Authenticated By: Marybelle Killings, M.D.    Scheduled Meds: . aspirin EC  81 mg Oral Daily  . atorvastatin  40 mg Oral q1800  . carvedilol  12.5 mg Oral BID WC  . insulin aspart  0-15 Units Subcutaneous TID WC  . insulin aspart  0-5 Units Subcutaneous QHS  . omega-3 acid ethyl esters  2 g Oral Daily  . piperacillin-tazobactam (ZOSYN)  IV  2.25 g Intravenous Q8H  .  vancomycin  1,500 mg Intravenous Q48H   Continuous Infusions: . 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 If 7PM-7AM, please contact night-coverage at [email or web address], password [facility] 06/02/2013, 9:47 AM  LOS: 4 days

## 2013-06-02 NOTE — Progress Notes (Signed)
*  PRELIMINARY RESULTS* Vascular Ultrasound Left Lower Extremity Arterial Duplex has been completed.   There is no obvious evidence of hemodynamically significant stenosis of the left lower extremity arteries. The waveforms diminish to monophasic in the posterior tibial, peroneal, and dorsalis pedis arteries. There is no apparent flow within the proximal posterior tibial artery, with recanalization in the mid and distal segments.  Incidental finding: Left Baker's cyst.  06/02/2013 4:20 PM Maudry Mayhew, RVT, RDCS, RDMS

## 2013-06-02 NOTE — Evaluation (Signed)
Clinical/Bedside Swallow Evaluation Patient Details  Name: [name] MRN: [id number] Date of Birth: 1951-08-14  Today's Date: 06/02/2013 Time: 0802-0843 SLP Time Calculation (min): 41 min  Past Medical History:  Past Medical History  Diagnosis Date  . ST elevation myocardial infarction (STEMI) of inferior wall  03/2009    Ostial/proximal RCA occlusion treated with BMS stent  . CAD (coronary artery disease), native coronary artery      status post PCI to the RCA for inferior STEMI  . Cancer     endo ca  . Hypertension associated with diabetes   . Hyperlipidemia LDL goal <70   . Diabetes mellitus, type II, insulin dependent     With complications - CAD, CVA, peripheral ulcer ,  . Stroke, acute, within 8 weeks  4/30/ 2014    Right-sided weakness, mostly with balance issues and only mild weakness.  . Diabetic peripheral neuropathy associated with type 2 diabetes mellitus   . Obesity, Class III, BMI 40-49.9 (morbid obesity)     BMI 43; 5' 2',  235 pounds 6.4 ounces  . CKD (chronic kidney disease), stage III    Past Surgical History:  Past Surgical History  Procedure Laterality Date  . Abdominal hysterectomy    . Leg tendon surgery      patient fell in a store right leg  . Leg surgery      hole in bone( during Childhood)  . Cardiac catheterization  03/31/2009  . Coronary angioplasty  03/31/2009    PTCA , bare-metal stent 3.5 mm x 24 mm ostium of RCA ,EF 55%   HPI:  62 yo female h/o dm, pvd, cad comes in with fighting left toe infection for weeks. Has been on oral antibiotics and had recent abi showing moderate reduced flow to left lower extremity. On 5/1 pt found to have Moderate to large sized acute right cerebellar infarct and subacute to chronic right parietal and corona radiata infarcts. Yesterday pt observed to cough with large sips of water. Swallow eval ordered.    Assessment / Plan / Recommendation Clinical Impression  Pt presents with minimal signs of decreased  airway protection, only with initial sip of water after awakened. Otherwise swallow function appears adequate. Likely that pt with intermittent episodes of sensed penetration due to possible respiratory compromise or overlarge intake. Instructed pt on taking small single sips, but she was not able to return demonstrate after 3 minutes. Until arousal and awareness improve she will need full supervision with meals for safety. Continue regular texture diet with thin liquids. Pt does not have dentures, but she is able to manage soft solids and avoids textures she does not like at baseline. SLP will follow briefly for tolerance and reinforcement of aspiration precautions.     Aspiration Risk  Moderate    Diet Recommendation Regular;Thin liquid   Liquid Administration via: Cup;Straw Medication Administration: Whole meds with liquid Supervision: Patient able to self feed;Full supervision/cueing for compensatory strategies Compensations: Slow rate;Small sips/bites Postural Changes and/or Swallow Maneuvers: Seated upright 90 degrees    Other  Recommendations Oral Care Recommendations: Oral care BID   Follow Up Recommendations  None    Frequency and Duration min 2x/week  2 weeks   Pertinent Vitals/Pain NA    SLP Swallow Goals Patient will utilize recommended strategies during swallow to increase swallowing safety with: Supervision/safety Swallow Study Goal #2 - Progress: Progressing toward goal   Swallow Study Prior Functional Status       General HPI: 62 yo female h/o  dm, pvd, cad comes in with fighting left toe infection for weeks. Has been on oral antibiotics and had recent abi showing moderate reduced flow to left lower extremity. On 5/1 pt found to have Moderate to large sized acute right cerebellar infarct and subacute to chronic right parietal and corona radiata infarcts. Yesterday pt observed to cough with large sips of water. Swallow eval ordered.  Type of Study: Bedside swallow  evaluation Previous Swallow Assessment: none Diet Prior to this Study: Regular;Thin liquids Temperature Spikes Noted: No Respiratory Status: Supplemental O2 delivered via (comment) Behavior/Cognition: Lethargic;Cooperative Oral Cavity - Dentition: Edentulous;Dentures, not available Self-Feeding Abilities: Able to feed self Patient Positioning: Upright in bed Baseline Vocal Quality: Clear Volitional Cough: Strong Volitional Swallow: Able to elicit    Oral/Motor/Sensory Function Overall Oral Motor/Sensory Function: Appears within functional limits for tasks assessed   Ice Chips     Thin Liquid Thin Liquid: Within functional limits Presentation: Straw;Self Fed    Nectar Thick Nectar Thick Liquid: Not tested   Honey Thick Honey Thick Liquid: Not tested   Puree Puree: Impaired Pharyngeal Phase Impairments: Throat Clearing - Immediate   Solid   GO    Solid: Not tested (pt avoided hard to chew foods, no dentures. )      Herbie Baltimore, MA CCC-SLP [phone]  [name], [name] 06/02/2013,8:44 AM

## 2013-06-02 NOTE — Consult Note (Signed)
[facility] Renal Consultation Note  Requesting MD: Eliseo Squires Indication for Consultation: A on CRF  HPI:  [name] is a 62 y.o. female with PMhs significant for DM, HTN, obesity, hyperlipidemia and CAD as well as what appears to be some baseline CKD with a crt of 1.3-1.5 earlier this year, 1.6-1.7 in May.  She presented on 7/11 with this left second toe ulcer which has been present for several months.  In fact, she was being worked up by Dr. Gwenlyn Found for angiogram of that leg.  She came in the hospital when it turned black and she was noted to have a leukocytosis.   She was started on antibiotics but her usual OP meds were con5tinued including hyzaar and norvasc,  She was noted to have somehypotension in the 70's - her hyzaar was stopped.  I see that she did get one dose of ibuprofen 800 early today but that also has been stopped.  Since her BP was low, her creatinine has trended up, was 5.53 yest and 6.5 today.  UOP has not been able to be quantified secondary to incontinence.  Family says that she has been making at least some urine.  Pt has been somnolent but they report that is better today.  She still has a good appetite.  Her BP is better.  She had a renal ultrasound done which was unremarkable.  I do not see a vanc level in the chart- she was previously on 1500 every 48 hours.  She remains on zosyn    Creatinine, Ser  Date/Time Value Range Status  06/02/2013  6:20 AM 6.50* 0.50 - 1.10 mg/dL Final  06/01/2013 11:26 AM 5.87* 0.50 - 1.10 mg/dL Final  06/01/2013  5:35 AM 5.53* 0.50 - 1.10 mg/dL Final  05/30/2013  4:05 AM 2.56* 0.50 - 1.10 mg/dL Final  05/29/2013  9:16 PM 2.49* 0.50 - 1.10 mg/dL Final  03/21/2013  5:10 AM 1.71* 0.50 - 1.10 mg/dL Final  03/19/2013  4:20 AM 1.68* 0.50 - 1.10 mg/dL Final  03/18/2013  3:09 PM 1.46* 0.50 - 1.10 mg/dL Final  03/18/2013  8:45 AM 1.51* 0.50 - 1.10 mg/dL Final  09/25/2012 12:50 PM 1.30* 0.50 - 1.10 mg/dL Final  07/20/2012  8:43 PM 1.34* 0.50 - 1.10  mg/dL Final  11/27/2010 11:55 AM 1.08  0.4 - 1.2 mg/dL Final  11/23/2010 11:51 AM 1.48* 0.4 - 1.2 mg/dL Final  11/23/2010  3:20 AM 1.73* 0.4 - 1.2 mg/dL Final  11/22/2010 10:23 AM 2.05* 0.4 - 1.2 mg/dL Final  11/22/2010  4:06 AM 2.07* 0.4 - 1.2 mg/dL Final  11/15/2010 12:30 PM 1.04  0.4 - 1.2 mg/dL Final  09/04/2010  6:30 AM 1.17  0.4 - 1.2 mg/dL Final  09/03/2010  6:10 AM 1.43* 0.4 - 1.2 mg/dL Final  09/02/2010  7:22 AM 1.18  0.4 - 1.2 mg/dL Final  09/01/2010  4:58 PM 1.07  0.4 - 1.2 mg/dL Final  04/03/2009  5:00 AM 1.34* 0.4 - 1.2 mg/dL Final  04/02/2009  4:20 AM 1.37* 0.4 - 1.2 mg/dL Final  04/01/2009  4:40 AM 1.23* 0.4 - 1.2 mg/dL Final  03/31/2009  9:45 PM 1.07  0.4 - 1.2 mg/dL Final  03/31/2009  6:27 PM 1.2  0.4 - 1.2 mg/dL Final  03/31/2009  6:23 PM 1.18  0.4 - 1.2 mg/dL Final     PMHx:   Past Medical History  Diagnosis Date  . ST elevation myocardial infarction (STEMI) of inferior wall  03/2009    Ostial/proximal  RCA occlusion treated with BMS stent  . CAD (coronary artery disease), native coronary artery      status post PCI to the RCA for inferior STEMI  . Cancer     endo ca  . Hypertension associated with diabetes   . Hyperlipidemia LDL goal <70   . Diabetes mellitus, type II, insulin dependent     With complications - CAD, CVA, peripheral ulcer ,  . Stroke, acute, within 8 weeks  4/30/ 2014    Right-sided weakness, mostly with balance issues and only mild weakness.  . Diabetic peripheral neuropathy associated with type 2 diabetes mellitus   . Obesity, Class III, BMI 40-49.9 (morbid obesity)     BMI 43; 5' 2',  235 pounds 6.4 ounces  . CKD (chronic kidney disease), stage III     Past Surgical History  Procedure Laterality Date  . Abdominal hysterectomy    . Leg tendon surgery      patient fell in a store right leg  . Leg surgery      hole in bone( during Childhood)  . Cardiac catheterization  03/31/2009  . Coronary angioplasty  03/31/2009    PTCA , bare-metal stent 3.5 mm  x 24 mm ostium of RCA ,EF 55%    Family Hx:  Family History  Problem Relation Age of Onset  . Alcoholism Mother     Died from complications  . Alcoholism Father     Died from complications  . Heart disease      Unknown, unclear. Patient is not a good historian.  Essentially no pertinent history known    Social History:  reports that she quit smoking about 18 years ago. She has never used smokeless tobacco. She reports that she does not drink alcohol or use illicit drugs.  Allergies: No Known Allergies  Medications: Prior to Admission medications   Medication Sig Start Date End Date Taking? Authorizing Provider  amLODipine (NORVASC) 5 MG tablet Take 1 tablet (5 mg total) by mouth daily at 12 noon. 03/23/13  Yes Geradine Girt, DO  aspirin EC 81 MG tablet Take 81 mg by mouth daily.   Yes Historical Provider, MD  atorvastatin (LIPITOR) 40 MG tablet Take 1 tablet (40 mg total) by mouth daily at 6 PM. 03/23/13  Yes Jessica U Vann, DO  carvedilol (COREG) 12.5 MG tablet Take 12.5 mg by mouth 2 (two) times daily with a meal.   Yes Historical Provider, MD  gabapentin (NEURONTIN) 100 MG capsule Take 100 mg by mouth 3 (three) times daily.   Yes Historical Provider, MD  insulin glargine (LANTUS) 100 UNIT/ML injection Inject 48 Units into the skin at bedtime.  03/23/13  Yes Geradine Girt, DO  losartan-hydrochlorothiazide (HYZAAR) 100-25 MG per tablet Take 1 tablet by mouth daily.   Yes Historical Provider, MD  omega-3 acid ethyl esters (LOVAZA) 1 G capsule Take 2 g by mouth daily.   Yes Historical Provider, MD  silver sulfADIAZINE (SILVADENE) 1 % cream Apply 1 application topically daily. Apply to toe   Yes Historical Provider, MD  amoxicillin-clavulanate (AUGMENTIN) 875-125 MG per tablet Take 1 tablet by mouth 2 (two) times daily. 04/28/13   Leonie Man, MD    I have reviewed the patient's current medications.  Labs:  Results for orders placed during the hospital encounter of 05/29/13 (from the  past 48 hour(s))  GLUCOSE, CAPILLARY     Status: Abnormal   Collection Time    05/31/13  4:23 PM  Result Value Range   Glucose-Capillary 197 (*) 70 - 99 mg/dL   Comment 1 Notify RN     Comment 2 Documented in Chart    URINALYSIS, ROUTINE W REFLEX MICROSCOPIC     Status: Abnormal   Collection Time    05/31/13  8:34 PM      Result Value Range   Color, Urine YELLOW  YELLOW   APPearance TURBID (*) CLEAR   Specific Gravity, Urine 1.015  1.005 - 1.030   pH 5.5  5.0 - 8.0   Glucose, UA 100 (*) NEGATIVE mg/dL   Hgb urine dipstick MODERATE (*) NEGATIVE   Bilirubin Urine NEGATIVE  NEGATIVE   Ketones, ur NEGATIVE  NEGATIVE mg/dL   Protein, ur 100 (*) NEGATIVE mg/dL   Urobilinogen, UA 1.0  0.0 - 1.0 mg/dL   Nitrite NEGATIVE  NEGATIVE   Leukocytes, UA LARGE (*) NEGATIVE  URINE CULTURE     Status: None   Collection Time    05/31/13  8:34 PM      Result Value Range   Specimen Description URINE, RANDOM     Special Requests NONE     Culture  Setup Time 06/01/2013 02:48     Colony Count NO GROWTH     Culture NO GROWTH     Report Status 06/02/2013 FINAL    URINE MICROSCOPIC-ADD ON     Status: Abnormal   Collection Time    05/31/13  8:34 PM      Result Value Range   Squamous Epithelial / LPF FEW (*) RARE   WBC, UA 7-10  <3 WBC/hpf   RBC / HPF 7-10  <3 RBC/hpf   Bacteria, UA MANY (*) RARE  GLUCOSE, CAPILLARY     Status: Abnormal   Collection Time    05/31/13 11:00 PM      Result Value Range   Glucose-Capillary 270 (*) 70 - 99 mg/dL  CBC     Status: Abnormal   Collection Time    06/01/13  5:35 AM      Result Value Range   WBC 12.1 (*) 4.0 - 10.5 K/uL   RBC 2.76 (*) 3.87 - 5.11 MIL/uL   Hemoglobin 7.6 (*) 12.0 - 15.0 g/dL   HCT 23.2 (*) 36.0 - 46.0 %   MCV 84.1  78.0 - 100.0 fL   MCH 27.5  26.0 - 34.0 pg   MCHC 32.8  30.0 - 36.0 g/dL   RDW 16.2 (*) 11.5 - 15.5 %   Platelets 194  150 - 400 K/uL  BASIC METABOLIC PANEL     Status: Abnormal   Collection Time    06/01/13  5:35  AM      Result Value Range   Sodium 130 (*) 135 - 145 mEq/L   Potassium 4.9  3.5 - 5.1 mEq/L   Chloride 96  96 - 112 mEq/L   CO2 20  19 - 32 mEq/L   Glucose, Bld 178 (*) 70 - 99 mg/dL   BUN 73 (*) 6 - 23 mg/dL   Creatinine, Ser 5.53 (*) 0.50 - 1.10 mg/dL   Calcium 8.5  8.4 - 10.5 mg/dL   GFR calc non Af Amer 8 (*) >90 mL/min   GFR calc Af Amer 9 (*) >90 mL/min   Comment:            The eGFR has been calculated     using the CKD EPI equation.     This calculation has not been  validated in all clinical     situations.     eGFR's persistently     <90 mL/min signify     possible Chronic Kidney Disease.  GLUCOSE, CAPILLARY     Status: Abnormal   Collection Time    06/01/13  6:01 AM      Result Value Range   Glucose-Capillary 169 (*) 70 - 99 mg/dL  CULTURE, BLOOD (ROUTINE X 2)     Status: None   Collection Time    06/01/13  7:30 AM      Result Value Range   Specimen Description BLOOD LEFT ARM     Special Requests BOTTLES DRAWN AEROBIC ONLY 10CC     Culture  Setup Time 06/01/2013 14:17     Culture       Value:        BLOOD CULTURE RECEIVED NO GROWTH TO DATE CULTURE WILL BE HELD FOR 5 DAYS BEFORE ISSUING A FINAL NEGATIVE REPORT   Report Status PENDING    CULTURE, BLOOD (ROUTINE X 2)     Status: None   Collection Time    06/01/13  7:35 AM      Result Value Range   Specimen Description BLOOD LEFT HAND     Special Requests BOTTLES DRAWN AEROBIC ONLY 10CC     Culture  Setup Time 06/01/2013 14:16     Culture       Value:        BLOOD CULTURE RECEIVED NO GROWTH TO DATE CULTURE WILL BE HELD FOR 5 DAYS BEFORE ISSUING A FINAL NEGATIVE REPORT   Report Status PENDING    GLUCOSE, CAPILLARY     Status: Abnormal   Collection Time    06/01/13 11:16 AM      Result Value Range   Glucose-Capillary 140 (*) 70 - 99 mg/dL   Comment 1 Notify RN    BASIC METABOLIC PANEL     Status: Abnormal   Collection Time    06/01/13 11:26 AM      Result Value Range   Sodium 131 (*) 135 - 145 mEq/L    Potassium 4.9  3.5 - 5.1 mEq/L   Chloride 98  96 - 112 mEq/L   CO2 20  19 - 32 mEq/L   Glucose, Bld 148 (*) 70 - 99 mg/dL   BUN 73 (*) 6 - 23 mg/dL   Creatinine, Ser 5.87 (*) 0.50 - 1.10 mg/dL   Calcium 8.5  8.4 - 10.5 mg/dL   GFR calc non Af Amer 7 (*) >90 mL/min   GFR calc Af Amer 8 (*) >90 mL/min   Comment:            The eGFR has been calculated     using the CKD EPI equation.     This calculation has not been     validated in all clinical     situations.     eGFR's persistently     <90 mL/min signify     possible Chronic Kidney Disease.  BLOOD GAS, ARTERIAL     Status: Abnormal   Collection Time    06/01/13  3:30 PM      Result Value Range   O2 Content 2.0     Delivery systems NASAL CANNULA     pH, Arterial 7.321 (*) 7.350 - 7.450   pCO2 arterial 36.0  35.0 - 45.0 mmHg   pO2, Arterial 70.5 (*) 80.0 - 100.0 mmHg   Bicarbonate 18.1 (*) 20.0 - 24.0 mEq/L  TCO2 19.2  0 - 100 mmol/L   Acid-base deficit 6.9 (*) 0.0 - 2.0 mmol/L   O2 Saturation 93.6     Patient temperature 98.6     Collection site LEFT RADIAL     Drawn by [id number]     Sample type ARTERIAL DRAW     Allens test (pass/fail) PASS  PASS  GLUCOSE, CAPILLARY     Status: Abnormal   Collection Time    06/01/13  4:15 PM      Result Value Range   Glucose-Capillary 173 (*) 70 - 99 mg/dL   Comment 1 Notify RN    PREPARE RBC (CROSSMATCH)     Status: None   Collection Time    06/01/13  6:00 PM      Result Value Range   Order Confirmation ORDER PROCESSED BY BLOOD BANK    AMMONIA     Status: None   Collection Time    06/01/13  7:42 PM      Result Value Range   Ammonia 23  11 - 60 umol/L  LACTIC ACID, PLASMA     Status: None   Collection Time    06/01/13  7:42 PM      Result Value Range   Lactic Acid, Venous 1.0  0.5 - 2.2 mmol/L  GLUCOSE, CAPILLARY     Status: Abnormal   Collection Time    06/01/13  9:05 PM      Result Value Range   Glucose-Capillary 155 (*) 70 - 99 mg/dL  CBC     Status: Abnormal    Collection Time    06/02/13  6:20 AM      Result Value Range   WBC 10.2  4.0 - 10.5 K/uL   RBC 2.67 (*) 3.87 - 5.11 MIL/uL   Hemoglobin 7.4 (*) 12.0 - 15.0 g/dL   HCT 23.0 (*) 36.0 - 46.0 %   MCV 86.1  78.0 - 100.0 fL   MCH 27.7  26.0 - 34.0 pg   MCHC 32.2  30.0 - 36.0 g/dL   RDW 16.8 (*) 11.5 - 15.5 %   Platelets 184  150 - 400 K/uL  BASIC METABOLIC PANEL     Status: Abnormal   Collection Time    06/02/13  6:20 AM      Result Value Range   Sodium 133 (*) 135 - 145 mEq/L   Potassium 4.7  3.5 - 5.1 mEq/L   Chloride 100  96 - 112 mEq/L   CO2 16 (*) 19 - 32 mEq/L   Glucose, Bld 132 (*) 70 - 99 mg/dL   BUN 77 (*) 6 - 23 mg/dL   Creatinine, Ser 6.50 (*) 0.50 - 1.10 mg/dL   Calcium 8.0 (*) 8.4 - 10.5 mg/dL   GFR calc non Af Amer 6 (*) >90 mL/min   GFR calc Af Amer 7 (*) >90 mL/min   Comment:            The eGFR has been calculated     using the CKD EPI equation.     This calculation has not been     validated in all clinical     situations.     eGFR's persistently     <90 mL/min signify     possible Chronic Kidney Disease.  GLUCOSE, CAPILLARY     Status: Abnormal   Collection Time    06/02/13  6:41 AM      Result Value Range   Glucose-Capillary 141 (*) 70 - 99  mg/dL  GLUCOSE, CAPILLARY     Status: Abnormal   Collection Time    06/02/13 11:21 AM      Result Value Range   Glucose-Capillary 177 (*) 70 - 99 mg/dL     ROS:  Review of systems not obtained due to patient factors. Pt is somnolent but arousable, most of history is obtained from family, they report reasonable appetite and even her somnolence is better.  She reports back pain today, no toe pain.  No edema, SOB, CP  Physical Exam: Filed Vitals:   06/02/13 0603  BP: 122/66  Pulse: 70  Temp: 99.2 F (37.3 C)  Resp: 16     General: obese BF, family member rubbing back, she is mostly alert at this time HEENT: PERRLA, EOMI, mm moist Neck: there is no JVD Heart: RRR Lungs: mostly clear to my exam Abdomen:  obese, soft non tender Extremities: no peripheral edema- left second toe with dry gangrene- no drainiage Skin: warm and dry Neuro: a little somnolent- (of note was on neurontin prior in this hosp as well)  Assessment/Plan: 62 year old BF with baseline CKD now presents with A on CRF in the setting of hypotension on an ARB and NSAIDS as well as sepsis from gangrenous toe 1.Renal- A on CRF in the setting of above.  Appropriate management has taken place, ARB stopped and BP is better.  I hope that this is something that will be reversible.  There are no indications for dialysis at this time but family aware that this could be a possibility.  I would like to place a foley catheter to just quantify UOP.  Please do not give any more ibuprofen.  I also trust that the pharmacy will check vanc levels and dose appropriately as supratherapuetic vanc level is likely to be part of the culprit here.    2. Hypertension/volume  - pt certainly does not appear volume overloaded.  I am OK with saline hydration for the time being.  Will place foley to quantify UOP  3. Anemia  - has been an issue this hosp - she appears to have already required a transfusion.  I will add aranesp and check iron stores and treat as needed.  4. Toe gangrene- on zosyn and vanc- Dr. Gwenlyn Found has seen.  Obviously does not want to do an arteriogram in the setting of AKI- he thinks this toe will autoamputate.  Thank you for this consultation, I will follow with you      Kyliegh Jester A 06/02/2013, 12:56 PM

## 2013-06-02 NOTE — Progress Notes (Signed)
ANTIBIOTIC CONSULT NOTE - FOLLOW UP  Pharmacy Consult for Vancomycin  Indication: diabetic foot infection, necrotic toe  No Known Allergies  Patient Measurements: Weight 107.1 kg Height 62 in  Vital Signs: Temp: 99.2 F (37.3 C) (07/15 0603) Temp src: Oral (07/15 0603) BP: 122/66 mmHg (07/15 0603) Pulse Rate: 70 (07/15 0603) Intake/Output from previous day: 07/14 0701 - 07/15 0700 In: 2052.5 [P.O.:540; I.V.:1412.5; IV Piggyback:100] Out: -  Intake/Output from this shift:    Labs:  Recent Labs  05/31/13 0525 06/01/13 0535 06/01/13 1126 06/02/13 0620  WBC 11.8* 12.1*  --  10.2  HGB 6.5* 7.6*  --  7.4*  PLT 176 194  --  184  CREATININE  --  5.53* 5.87* 6.50*   The CrCl is unknown because both a height and weight (above a minimum accepted value) are required for this calculation. No results found for this basename: VANCOTROUGH, VANCOPEAK, VANCORANDOM, GENTTROUGH, GENTPEAK, GENTRANDOM, TOBRATROUGH, TOBRAPEAK, TOBRARND, AMIKACINPEAK, AMIKACINTROU, AMIKACIN,  in the last 72 hours   Microbiology: Recent Results (from the past 720 hour(s))  CULTURE, BLOOD (ROUTINE X 2)     Status: None   Collection Time    05/30/13  3:46 PM      Result Value Range Status   Specimen Description BLOOD RIGHT HAND   Final   Special Requests BOTTLES DRAWN AEROBIC ONLY 2CC   Final   Culture  Setup Time 05/30/2013 22:14   Final   Culture     Final   Value:        BLOOD CULTURE RECEIVED NO GROWTH TO DATE CULTURE WILL BE HELD FOR 5 DAYS BEFORE ISSUING A FINAL NEGATIVE REPORT   Report Status PENDING   Incomplete  URINE CULTURE     Status: None   Collection Time    05/31/13  8:34 PM      Result Value Range Status   Specimen Description URINE, RANDOM   Final   Special Requests NONE   Final   Culture  Setup Time 06/01/2013 02:48   Final   Colony Count NO GROWTH   Final   Culture NO GROWTH   Final   Report Status 06/02/2013 FINAL   Final  CULTURE, BLOOD (ROUTINE X 2)     Status: None   Collection Time    06/01/13  7:30 AM      Result Value Range Status   Specimen Description BLOOD LEFT ARM   Final   Special Requests BOTTLES DRAWN AEROBIC ONLY 10CC   Final   Culture  Setup Time 06/01/2013 14:17   Final   Culture     Final   Value:        BLOOD CULTURE RECEIVED NO GROWTH TO DATE CULTURE WILL BE HELD FOR 5 DAYS BEFORE ISSUING A FINAL NEGATIVE REPORT   Report Status PENDING   Incomplete  CULTURE, BLOOD (ROUTINE X 2)     Status: None   Collection Time    06/01/13  7:35 AM      Result Value Range Status   Specimen Description BLOOD LEFT HAND   Final   Special Requests BOTTLES DRAWN AEROBIC ONLY 10CC   Final   Culture  Setup Time 06/01/2013 14:16   Final   Culture     Final   Value:        BLOOD CULTURE RECEIVED NO GROWTH TO DATE CULTURE WILL BE HELD FOR 5 DAYS BEFORE ISSUING A FINAL NEGATIVE REPORT   Report Status PENDING   Incomplete  Anti-infectives   Start     Dose/Rate Route Frequency Ordered Stop   06/01/13 2200  piperacillin-tazobactam (ZOSYN) IVPB 2.25 g     2.25 g 100 mL/hr over 30 Minutes Intravenous 3 times per day 06/01/13 1402     06/01/13 0000  vancomycin (VANCOCIN) 1,500 mg in sodium chloride 0.9 % 500 mL IVPB  Status:  Discontinued     1,500 mg 250 mL/hr over 120 Minutes Intravenous Every 48 hours 05/30/13 0054 06/02/13 1132   05/30/13 0600  piperacillin-tazobactam (ZOSYN) IVPB 3.375 g  Status:  Discontinued     3.375 g 12.5 mL/hr over 240 Minutes Intravenous 3 times per day 05/30/13 0249 06/01/13 1402   05/30/13 0100  vancomycin (VANCOCIN) 2,000 mg in sodium chloride 0.9 % 500 mL IVPB     2,000 mg 250 mL/hr over 120 Minutes Intravenous  Once 05/30/13 0029 05/30/13 0331   05/30/13 0015  piperacillin-tazobactam (ZOSYN) IVPB 3.375 g     3.375 g 12.5 mL/hr over 240 Minutes Intravenous  Once 05/30/13 0004 05/30/13 0101      Assessment: 62 yo F being treated for infection of necrotic 2nd toe with vancomycin and zosyn.  It is suspected toe will auto  amputate per MD, no plans for surgery at this time. Last dose of vanc 1500mg  on 7/14.  Due to continued increase in SCr (today 6.5), will hold tonight's dose of Vancomycin and draw a random level with tomorrow's AM labs.  Will follow/up then.  Goal of Therapy:  Vancomycin trough level 10-15 mcg/ml, renal dosage adjustment of medication, eradication of infection  Plan:  - Hold Vancomycin, and follow up random level tomorrow morning for evaluation  Ovid Curd E. Jacqlyn Larsen, PharmD Clinical Pharmacist - Resident Pager: [phone] Pharmacy: [phone] 06/02/2013 1:35 PM

## 2013-06-02 NOTE — Progress Notes (Signed)
Subjective:  Lethargic but responds appropriately.  Objective:  Vital Signs in the last 24 hours: Temp:  [98.2 F (36.8 C)-100.8 F (38.2 C)] 99.2 F (37.3 C) (07/15 0603) Pulse Rate:  [66-102] 70 (07/15 0603) Resp:  [16] 16 (07/15 0603) BP: (110-122)/(59-66) 122/66 mmHg (07/15 0603) SpO2:  [96 %-100 %] 97 % (07/15 0603)  Intake/Output from previous day:  Intake/Output Summary (Last 24 hours) at 06/02/13 [id number] Last data filed at 06/02/13 0645  Gross per 24 hour  Intake 2052.5 ml  Output      0 ml  Net 2052.5 ml    Physical Exam: General appearance: cooperative, no distress, morbidly obese and slowed mentation Lungs: clear to auscultation bilaterally Heart: regular rate and rhythm Extremities: no change in Lt toe   Rate: 80  Rhythm: indeterminate not on telemetry  Lab Results:  Recent Labs  06/01/13 0535 06/02/13 0620  WBC 12.1* 10.2  HGB 7.6* 7.4*  PLT 194 184    Recent Labs  06/01/13 1126 06/02/13 0620  NA 131* 133*  K 4.9 4.7  CL 98 100  CO2 20 16*  GLUCOSE 148* 132*  BUN 73* 77*  CREATININE 5.87* 6.50*     Imaging: Imaging results have been reviewed  Cardiac Studies:  Assessment/Plan:   Principal Problem:   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 Plan for medical Rx only for her ischemic toe. Will defer Rx of acute on chronic renal failure to primary service. ? Dialysis. I/O positive 3L, urine output negligible. Lethargy probably from renal failure. I suspect she has significant sleep apnea as well. Her EF was 65-70% in  April 2014.   Kerin Ransom PA-C [facility] [id number] 06/02/2013, 8:32 AM   Agree with note written by Kerin Ransom [facility]  Suspect her toe will auto amputate. Not a candidate for angio or invasive procedure. Agree lethary prob multifactorial (OSA as well as toxic/metabolic from uremia). May benefit from Renal involvement +/- dialysis. Will follow with you.   Lorretta Harp 06/02/2013 10:05 AM

## 2013-06-02 NOTE — Progress Notes (Signed)
Physical Therapy Treatment Patient Details Name: [name] MRN: [id number] DOB: 07-01-1951 Today's Date: 06/02/2013 Time: [id number] PT Time Calculation (min): 25 min  PT Assessment / Plan / Recommendation  PT Comments   Pt continues to be lethargic and limited participation with PT.  Attempted to get pt to perform HEP however difficulty staying awake.  However pt able show activation in bilateral LE and ability to move LE against gravity.     Follow Up Recommendations  SNF;Supervision/Assistance - 24 hour     Equipment Recommendations  Wheelchair (measurements PT);Wheelchair cushion (measurements PT);Hospital bed    Frequency Min 3X/week   Progress towards PT Goals Progress towards PT goals: Progressing toward goals  Plan Current plan remains appropriate    Precautions / Restrictions Precautions Precautions: Fall Restrictions Weight Bearing Restrictions: No   Pertinent Vitals/Pain C/o knee joint pain when attempting HEP; does not rate    Mobility  Bed Mobility Bed Mobility: Rolling Left;Left Sidelying to Sit;Sitting - Scoot to Edge of Bed Rolling Left: 4: Min assist;With rail Left Sidelying to Sit: 3: Mod assist Details for Bed Mobility Assistance: (A) to elevate trunk OOB with use of rail.  Pt able to advance LE to EOB. Transfers Transfers: Sit to Stand;Stand to Sit;Lateral/Scoot Transfers Sit to Stand: 1: +2 Total assist;From bed Sit to Stand: Patient Percentage: 0% Stand to Sit: 1: +2 Total assist;To bed Stand to Sit: Patient Percentage: 10% Lateral/Scoot Transfers: 1: +2 Total assist Lateral Transfers: Patient Percentage: 0% Details for Transfer Assistance: No noted hip/knee muscle recruitment with sit to stand attempts; Unable to lift hips off of bed, even with maximal bilateral knee blocking; Continue to question if this is a pt participation/effort issue especially compared to bed mobility  Ambulation/Gait Ambulation/Gait Assistance: Not tested (comment)     Exercises     PT Diagnosis:    PT Problem List:   PT Treatment Interventions:     PT Goals (current goals can now be found in the care plan section) Acute Rehab PT Goals Patient Stated Goal: did not state PT Goal Formulation: With patient Time For Goal Achievement: 06/15/13 Potential to Achieve Goals: Fair  Visit Information  Last PT Received On: 06/02/13 Assistance Needed: +3 or more History of Present Illness: 62 yo female h/o dm, pvd, cad comes in with fighting left toe infection for weeks; Relatively recent CVA    Subjective Data  Subjective: "Ok" (pt with limited verbalization due to lethargy) Patient Stated Goal: did not state   Cognition  Cognition Arousal/Alertness: Lethargic Behavior During Therapy: Flat affect Overall Cognitive Status: Impaired/Different from baseline Area of Impairment: Attention Current Attention Level: Sustained General Comments: Pt continues to be lethargic    Balance  Balance Balance Assessed: Yes Static Sitting Balance Static Sitting - Balance Support: Right upper extremity supported;Left upper extremity supported Static Sitting - Level of Assistance: 3: Mod assist;4: Min assist Static Sitting - Comment/# of Minutes: Occasional (A) to maintain balance with cues for upright posture due to lethargy  End of Session PT - End of Session Equipment Utilized During Treatment: Gait belt Activity Tolerance: Patient limited by lethargy Patient left: in chair;with call bell/phone within reach Nurse Communication: Mobility status;Need for lift equipment   GP     Teeghan Hammer 06/02/2013, 12:40 PM  [place], [place] DPT [phone]

## 2013-06-03 ENCOUNTER — Encounter: Payer: Self-pay | Admitting: Cardiovascular Disease

## 2013-06-03 LAB — URINALYSIS, ROUTINE W REFLEX MICROSCOPIC
Glucose, UA: NEGATIVE mg/dL
Nitrite: NEGATIVE
Specific Gravity, Urine: 1.022 (ref 1.005–1.030)
pH: 5 (ref 5.0–8.0)

## 2013-06-03 LAB — IRON AND TIBC: Iron: 15 ug/dL — ABNORMAL LOW (ref 42–135)

## 2013-06-03 LAB — RENAL FUNCTION PANEL
Albumin: 2.1 g/dL — ABNORMAL LOW (ref 3.5–5.2)
BUN: 82 mg/dL — ABNORMAL HIGH (ref 6–23)
Creatinine, Ser: 7.18 mg/dL — ABNORMAL HIGH (ref 0.50–1.10)
Phosphorus: 8.7 mg/dL — ABNORMAL HIGH (ref 2.3–4.6)

## 2013-06-03 LAB — GLUCOSE, CAPILLARY: Glucose-Capillary: 156 mg/dL — ABNORMAL HIGH (ref 70–99)

## 2013-06-03 LAB — URINE MICROSCOPIC-ADD ON

## 2013-06-03 LAB — HEPATITIS B SURFACE ANTIGEN: Hepatitis B Surface Ag: NEGATIVE

## 2013-06-03 NOTE — Progress Notes (Addendum)
[facility] PROGRESS NOTE  [name] [id number] DOB: [birth year] DOA: 05/29/2013 PCP: Hayden Rasmussen., MD  62 yo female h/o dm, pvd, cad comes in with fighting left toe infection for weeks. Has been on oral antibiotics and had recent abi showing moderate reduced flow to left lower extremity. She has had necrosis to this toe that has progressively gotten worse and now with some mild erythema. She has no pain due to peripheral neuropathy from her diabetes. She denies fevers. She finished some oral abx in the last week.  Patient dropped BP and renal failure gradually worsened- renal consult  Assessment/Plan: AKI on CKD- IVF, hold BP meds- ? From low BP +uncontrolled DM- renal U/S ok-- baseline about 1.7-  foley for better i/os as patient is incontinent of urine- neurontin d/c'd; appreciate renal consult- ? 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 Gwenlyn Found  Anemia of chronic disease - transfuse when < 7 (CAD but no active CP)- 2 units on 7/13- aranesp  Type II or unspecified type diabetes mellitus with renal manifestations, uncontrolled- SSI and home meds- monitor BS- eating  CAD (coronary artery disease), native coronary artery- stable, no CP   Peripheral arterial disease   H/o CVA  Lethargy- d/c neurontin- improving- on camera very active but less active when you walk in the room   Code Status: full Family Communication: patient at bedside- daughter Jolyn Lent) Disposition Plan: inpt   Consultants:  Gwenlyn Found  renal  Procedures:  none  Antibiotics:  vanc/zosyn  HPI/Subjective: More awake today Answers all questions-  Ready to eat lunch   Objective: Filed Vitals:   06/02/13 1416 06/02/13 1808 06/02/13 2031 06/03/13 0559  BP: 92/29 110/49 98/54 125/64  Pulse: 53 65 62 65  Temp: 97.7 F (36.5 C)  98 F (36.7 C) 99.3 F (37.4 C)  TempSrc: Oral   Oral Oral  Resp:   18 18  SpO2: 95%  100% 99%    Intake/Output Summary (Last 24 hours) at 06/03/13 1305 Last data filed at 06/03/13 0700  Gross per 24 hour  Intake    240 ml  Output    575 ml  Net   -335 ml   There were no vitals filed for this visit.  Exam:   General:  A+Ox3, NAD- flat affect  Cardiovascular: rrr  Respiratory: clear anterior  Abdomen: +BS, soft, NT  Musculoskeletal: moves all 4ext   Left toe 2nd: necrotic and dry  Data Reviewed: Basic Metabolic Panel:  Recent Labs Lab 05/30/13 0405 06/01/13 0535 06/01/13 1126 06/02/13 0620 06/03/13 0545  NA 135 130* 131* 133* 136  K 4.1 4.9 4.9 4.7 5.0  CL 100 96 98 100 101  CO2 23 20 20  16* 18*  GLUCOSE 356* 178* 148* 132* 148*  BUN 53* 73* 73* 77* 82*  CREATININE 2.56* 5.53* 5.87* 6.50* 7.18*  CALCIUM 8.6 8.5 8.5 8.0* 8.6  PHOS  --   --   --   --  8.7*   Liver Function Tests:  Recent Labs Lab 06/03/13 0545  ALBUMIN 2.1*   No results found for this basename: LIPASE, AMYLASE,  in the last 168 hours  Recent Labs Lab 06/01/13 1942  AMMONIA 23   CBC:  Recent Labs Lab 05/29/13 2116 05/30/13 0405 05/31/13 0525 06/01/13 0535 06/02/13 0620  WBC 12.0* 10.7* 11.8* 12.1* 10.2  NEUTROABS 8.5*  --   --   --   --  HGB 7.9* 7.4* 6.5* 7.6* 7.4*  HCT 23.8* 22.4* 19.6* 23.2* 23.0*  MCV 83.5 84.5 84.8 84.1 86.1  PLT 198 189 176 194 184   Cardiac Enzymes: No results found for this basename: CKTOTAL, CKMB, CKMBINDEX, TROPONINI,  in the last 168 hours BNP (last 3 results)  Recent Labs  09/25/12 1250  PROBNP 1010.0*   CBG:  Recent Labs Lab 06/02/13 1121 06/02/13 1731 06/02/13 2215 06/03/13 0622 06/03/13 1210  GLUCAP 177* 216* 206* 156* 108*    Recent Results (from the past 240 hour(s))  CULTURE, BLOOD (ROUTINE X 2)     Status: None   Collection Time    05/30/13  3:46 PM      Result Value Range Status   Specimen Description BLOOD RIGHT HAND   Final   Special Requests BOTTLES DRAWN  AEROBIC ONLY 2CC   Final   Culture  Setup Time 05/30/2013 22:14   Final   Culture     Final   Value:        BLOOD CULTURE RECEIVED NO GROWTH TO DATE CULTURE WILL BE HELD FOR 5 DAYS BEFORE ISSUING A FINAL NEGATIVE REPORT   Report Status PENDING   Incomplete  URINE CULTURE     Status: None   Collection Time    05/31/13  8:34 PM      Result Value Range Status   Specimen Description URINE, RANDOM   Final   Special Requests NONE   Final   Culture  Setup Time 06/01/2013 02:48   Final   Colony Count NO GROWTH   Final   Culture NO GROWTH   Final   Report Status 06/02/2013 FINAL   Final  CULTURE, BLOOD (ROUTINE X 2)     Status: None   Collection Time    06/01/13  7:30 AM      Result Value Range Status   Specimen Description BLOOD LEFT ARM   Final   Special Requests BOTTLES DRAWN AEROBIC ONLY 10CC   Final   Culture  Setup Time 06/01/2013 14:17   Final   Culture     Final   Value:        BLOOD CULTURE RECEIVED NO GROWTH TO DATE CULTURE WILL BE HELD FOR 5 DAYS BEFORE ISSUING A FINAL NEGATIVE REPORT   Report Status PENDING   Incomplete  CULTURE, BLOOD (ROUTINE X 2)     Status: None   Collection Time    06/01/13  7:35 AM      Result Value Range Status   Specimen Description BLOOD LEFT HAND   Final   Special Requests BOTTLES DRAWN AEROBIC ONLY 10CC   Final   Culture  Setup Time 06/01/2013 14:16   Final   Culture     Final   Value:        BLOOD CULTURE RECEIVED NO GROWTH TO DATE CULTURE WILL BE HELD FOR 5 DAYS BEFORE ISSUING A FINAL NEGATIVE REPORT   Report Status PENDING   Incomplete     Studies: No results found.  Scheduled Meds: . aspirin EC  81 mg Oral Daily  . atorvastatin  40 mg Oral q1800  . carvedilol  12.5 mg Oral BID WC  . darbepoetin (ARANESP) injection - NON-DIALYSIS  100 mcg Subcutaneous Q Wed-1800  . insulin aspart  0-15 Units Subcutaneous TID WC  . insulin aspart  0-5 Units Subcutaneous QHS  . omega-3 acid ethyl esters  2 g Oral Daily  . 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 If 7PM-7AM, please contact night-coverage at [email or web address], password [facility] 06/03/2013, 1:05 PM  LOS: 5 days

## 2013-06-03 NOTE — Progress Notes (Signed)
Speech Language Pathology Dysphagia Treatment Patient Details Name: [name] MRN: [id number] DOB: Jul 13, 1951 Today's Date: 06/03/2013 Time: [id number] SLP Time Calculation (min): 18 min  Assessment / Plan / Recommendation Clinical Impression  Pt had started lunch.  No overt s/s aspiration observed or reported.  Reviewed safe swallow precautions, which are posted at [facility].  Cues required for small bite/sip size and slow rate.      Diet Recommendation  Continue with Current Diet: Regular;Thin liquid    SLP Plan Continue with current plan of care   Pertinent Vitals/Pain Leg pain   Swallowing Goals  SLP Swallowing Goals Patient will utilize recommended strategies during swallow to increase swallowing safety with: Supervision/safety Swallow Study Goal #2 - Progress: Progressing toward goal  General Temperature Spikes Noted: No Respiratory Status: Supplemental O2 delivered via (comment) ([place]@4L ) Behavior/Cognition: Lethargic;Cooperative Oral Cavity - Dentition: Edentulous Patient Positioning: Upright in bed  Oral Cavity - Oral Hygiene Does patient have any of the following "at risk" factors?: Oxygen therapy - cannula, mask, simple oxygen devices Brush patient's teeth BID with toothbrush (using toothpaste with fluoride): Yes Patient is AT RISK - Oral Care Protocol followed (see row info): Yes   Dysphagia Treatment Treatment focused on: Skilled observation of diet tolerance;Patient/family/caregiver education Treatment Methods/Modalities: Skilled observation Patient observed directly with PO's: Yes Type of PO's observed: Regular;Thin liquids Feeding: Needs assist Liquids provided via: Straw Type of cueing: Verbal;Tactile Amount of cueing: Moderate   Celia B. Quentin Ore [facility], CCC-SLP [id number]      Shonna Chock 06/03/2013, 1:08 PM

## 2013-06-03 NOTE — Progress Notes (Signed)
ANTIBIOTIC CONSULT NOTE - FOLLOW UP  Pharmacy Consult for Vancomycin and Zosyn Indication: diabetic foot infection (2nd toe)  No Known Allergies  Patient Measurements:   Weight ~ 107 kg Height ~ 62 inches   Vital Signs: Temp: 99.3 F (37.4 C) (07/16 0559) Temp src: Oral (07/16 0559) BP: 125/64 mmHg (07/16 0559) Pulse Rate: 65 (07/16 0559) Intake/Output from previous day: 07/15 0701 - 07/16 0700 In: 480 [P.O.:480] Out: 575 [Urine:575] Intake/Output from this shift:    Labs:  Recent Labs  06/01/13 0535 06/01/13 1126 06/02/13 0620 06/03/13 0545  WBC 12.1*  --  10.2  --   HGB 7.6*  --  7.4*  --   PLT 194  --  184  --   CREATININE 5.53* 5.87* 6.50* 7.18*   CrCl ~ 10 ml/min   Microbiology: Recent Results (from the past 720 hour(s))  CULTURE, BLOOD (ROUTINE X 2)     Status: None   Collection Time    05/30/13  3:46 PM      Result Value Range Status   Specimen Description BLOOD RIGHT HAND   Final   Special Requests BOTTLES DRAWN AEROBIC ONLY 2CC   Final   Culture  Setup Time 05/30/2013 22:14   Final   Culture     Final   Value:        BLOOD CULTURE RECEIVED NO GROWTH TO DATE CULTURE WILL BE HELD FOR 5 DAYS BEFORE ISSUING A FINAL NEGATIVE REPORT   Report Status PENDING   Incomplete  URINE CULTURE     Status: None   Collection Time    05/31/13  8:34 PM      Result Value Range Status   Specimen Description URINE, RANDOM   Final   Special Requests NONE   Final   Culture  Setup Time 06/01/2013 02:48   Final   Colony Count NO GROWTH   Final   Culture NO GROWTH   Final   Report Status 06/02/2013 FINAL   Final  CULTURE, BLOOD (ROUTINE X 2)     Status: None   Collection Time    06/01/13  7:30 AM      Result Value Range Status   Specimen Description BLOOD LEFT ARM   Final   Special Requests BOTTLES DRAWN AEROBIC ONLY 10CC   Final   Culture  Setup Time 06/01/2013 14:17   Final   Culture     Final   Value:        BLOOD CULTURE RECEIVED NO GROWTH TO DATE CULTURE WILL  BE HELD FOR 5 DAYS BEFORE ISSUING A FINAL NEGATIVE REPORT   Report Status PENDING   Incomplete  CULTURE, BLOOD (ROUTINE X 2)     Status: None   Collection Time    06/01/13  7:35 AM      Result Value Range Status   Specimen Description BLOOD LEFT HAND   Final   Special Requests BOTTLES DRAWN AEROBIC ONLY 10CC   Final   Culture  Setup Time 06/01/2013 14:16   Final   Culture     Final   Value:        BLOOD CULTURE RECEIVED NO GROWTH TO DATE CULTURE WILL BE HELD FOR 5 DAYS BEFORE ISSUING A FINAL NEGATIVE REPORT   Report Status PENDING   Incomplete    Anti-infectives   Start     Dose/Rate Route Frequency Ordered Stop   06/01/13 2200  piperacillin-tazobactam (ZOSYN) IVPB 2.25 g     2.25 g 100 mL/hr  over 30 Minutes Intravenous 3 times per day 06/01/13 1402     06/01/13 0000  vancomycin (VANCOCIN) 1,500 mg in sodium chloride 0.9 % 500 mL IVPB  Status:  Discontinued     1,500 mg 250 mL/hr over 120 Minutes Intravenous Every 48 hours 05/30/13 0054 06/02/13 1132   05/30/13 0600  piperacillin-tazobactam (ZOSYN) IVPB 3.375 g  Status:  Discontinued     3.375 g 12.5 mL/hr over 240 Minutes Intravenous 3 times per day 05/30/13 0249 06/01/13 1402   05/30/13 0100  vancomycin (VANCOCIN) 2,000 mg in sodium chloride 0.9 % 500 mL IVPB     2,000 mg 250 mL/hr over 120 Minutes Intravenous  Once 05/30/13 0029 05/30/13 0331   05/30/13 0015  piperacillin-tazobactam (ZOSYN) IVPB 3.375 g     3.375 g 12.5 mL/hr over 240 Minutes Intravenous  Once 05/30/13 0004 05/30/13 0101      Assessment: 62 yo F admitted 05/29/13 with several week history of toe infection not improving on oral antibiotics.  Per MD notes, expect toe will auto amputate and no plans for surgical or vascular intervention at this time.  SCr continues to increase as a result of AKI.  CrCl ~ 9 ml/min.   Renal MD considering temporary HD (starting 7/17) if renal function worsens.  Last Vancomycin dose 7/14 at 0200.  Vanc random level today was 26.2.   Anticipate Vancomycin level will remain above goal for next 24-48 hours.   Goal of Therapy:  Vancomycin trough level 10-15 mcg/ml Renal dose adjustment of medications.  Plan:  Continue to hold Vancomycin doses.   Repeat Vancomycin random level with AM labs to calculate pt specific kinetics and determine timing of next dose.  Follow up renal plans for HD as this will alter plans for Vancomycin dosing. Follow WBC, fever, culture data, and clinical course.   [employer], Pharm.D., BCPS Clinical Pharmacist Pager [phone] 06/03/2013 2:09 PM

## 2013-06-03 NOTE — Progress Notes (Signed)
Subjective:  Pt being uncooperative with staff- "playing possom"  Unfortunately UOP not great Objective Vital signs in last 24 hours: Filed Vitals:   06/02/13 1416 06/02/13 1808 06/02/13 2031 06/03/13 0559  BP: 92/29 110/49 98/54 125/64  Pulse: 53 65 62 65  Temp: 97.7 F (36.5 C)  98 F (36.7 C) 99.3 F (37.4 C)  TempSrc: Oral  Oral Oral  Resp:   18 18  SpO2: 95%  100% 99%   Weight change:   Intake/Output Summary (Last 24 hours) at 06/03/13 1109 Last data filed at 06/03/13 0700  Gross per 24 hour  Intake    360 ml  Output    575 ml  Net   -215 ml   Labs: Basic Metabolic Panel:  Recent Labs Lab 06/01/13 1126 06/02/13 0620 06/03/13 0545  NA 131* 133* 136  K 4.9 4.7 5.0  CL 98 100 101  CO2 20 16* 18*  GLUCOSE 148* 132* 148*  BUN 73* 77* 82*  CREATININE 5.87* 6.50* 7.18*  CALCIUM 8.5 8.0* 8.6  PHOS  --   --  8.7*   Liver Function Tests:  Recent Labs Lab 06/03/13 0545  ALBUMIN 2.1*   No results found for this basename: LIPASE, AMYLASE,  in the last 168 hours  Recent Labs Lab 06/01/13 1942  AMMONIA 23   CBC:  Recent Labs Lab 05/29/13 2116 05/30/13 0405 05/31/13 0525 06/01/13 0535 06/02/13 0620  WBC 12.0* 10.7* 11.8* 12.1* 10.2  NEUTROABS 8.5*  --   --   --   --   HGB 7.9* 7.4* 6.5* 7.6* 7.4*  HCT 23.8* 22.4* 19.6* 23.2* 23.0*  MCV 83.5 84.5 84.8 84.1 86.1  PLT 198 189 176 194 184   Cardiac Enzymes: No results found for this basename: CKTOTAL, CKMB, CKMBINDEX, TROPONINI,  in the last 168 hours CBG:  Recent Labs Lab 06/02/13 0641 06/02/13 1121 06/02/13 1731 06/02/13 2215 06/03/13 0622  GLUCAP 141* 177* 216* 206* 156*    Iron Studies: No results found for this basename: IRON, TIBC, TRANSFERRIN, FERRITIN,  in the last 72 hours Studies/Results: No results found. Medications: Infusions: . sodium chloride 125 mL/hr at 06/02/13 0414    Scheduled Medications: . aspirin EC  81 mg Oral Daily  . atorvastatin  40 mg Oral q1800  .  carvedilol  12.5 mg Oral BID WC  . darbepoetin (ARANESP) injection - NON-DIALYSIS  100 mcg Subcutaneous Q Wed-1800  . insulin aspart  0-15 Units Subcutaneous TID WC  . insulin aspart  0-5 Units Subcutaneous QHS  . omega-3 acid ethyl esters  2 g Oral Daily  . piperacillin-tazobactam (ZOSYN)  IV  2.25 g Intravenous Q8H    have reviewed scheduled and prn medications.  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 Appropriate management has taken place, ARB stopped and BP is better.  I did put parameters on her BP meds to avoid another drop in BP.   Unfortunately UOP is not great and numbers have worsened from yesterday to today and is difficult to tell but seems much more somnolent so  could be uremic.  Now that can get U/A will check to rule out UTI.  Have plans in place that if function does not improve by tomorrow that she will need an HD treatment for support. I have informed the family of this.  I am still hopeful that this insult will eventually be reversible  2. Hypertension/volume - pt certainly does not appear volume overloaded. Stop saline hydration given increased O2 req.   3. Anemia - has been an issue this hosp - she appears to have already required a transfusion. I will add aranesp and check iron stores  (pending)and treat as needed.  4. Toe gangrene- on zosyn and vanc- vanc trough high, could be part of etiology of AKI- doses being held- Dr. Gwenlyn Found has seen. Obviously does not want to do an arteriogram in the setting of AKI- he thinks this toe will autoamputate.       [name] A   06/03/2013,11:09 AM  LOS: 5 days

## 2013-06-03 NOTE — ED Provider Notes (Signed)
Medical screening examination/treatment/procedure(s) were performed by non-physician practitioner and as supervising physician I was immediately available for consultation/collaboration.   Julianne Rice, MD 06/03/13 [phone]

## 2013-06-04 ENCOUNTER — Ambulatory Visit: Payer: Medicare Other | Admitting: Cardiovascular Disease

## 2013-06-04 ENCOUNTER — Encounter (HOSPITAL_COMMUNITY): Payer: Self-pay | Admitting: Radiology

## 2013-06-04 ENCOUNTER — Inpatient Hospital Stay (HOSPITAL_COMMUNITY): Payer: Medicare Other

## 2013-06-04 LAB — TYPE AND SCREEN
Unit division: 0
Unit division: 0
Unit division: 0

## 2013-06-04 LAB — CBC
MCH: 27.8 pg (ref 26.0–34.0)
Platelets: 241 10*3/uL (ref 150–400)
RBC: 2.73 MIL/uL — ABNORMAL LOW (ref 3.87–5.11)

## 2013-06-04 LAB — URINE CULTURE

## 2013-06-04 LAB — RENAL FUNCTION PANEL
BUN: 90 mg/dL — ABNORMAL HIGH (ref 6–23)
CO2: 17 mEq/L — ABNORMAL LOW (ref 19–32)
Chloride: 100 mEq/L (ref 96–112)
Creatinine, Ser: 7.23 mg/dL — ABNORMAL HIGH (ref 0.50–1.10)
GFR calc non Af Amer: 5 mL/min — ABNORMAL LOW (ref >90)
Glucose, Bld: 202 mg/dL — ABNORMAL HIGH (ref 70–99)

## 2013-06-04 LAB — GLUCOSE, CAPILLARY
Glucose-Capillary: 198 mg/dL — ABNORMAL HIGH (ref 70–99)
Glucose-Capillary: 174 mg/dL — ABNORMAL HIGH (ref 70–99)

## 2013-06-04 LAB — HEPATITIS B CORE ANTIBODY, IGM: Hep B C IgM: NEGATIVE

## 2013-06-04 MED ORDER — PENTAFLUOROPROP-TETRAFLUOROETH EX AERO: 1.0000 "application " | INHALATION_SPRAY | CUTANEOUS | Status: DC | PRN

## 2013-06-04 MED ORDER — ALTEPLASE 2 MG IJ SOLR
2.0000 mg | Freq: Once | INTRAMUSCULAR | Status: DC | PRN
  Filled 2013-06-04: qty 2

## 2013-06-04 MED ORDER — LIDOCAINE HCL (PF) 1 % IJ SOLN: 5.0000 mL | INTRAMUSCULAR | Status: DC | PRN

## 2013-06-04 MED ORDER — HEPARIN SODIUM (PORCINE) 1000 UNIT/ML DIALYSIS
1000.0000 [IU] | INTRAMUSCULAR | Status: DC | PRN
  Filled 2013-06-04: qty 1

## 2013-06-04 MED ORDER — SODIUM CHLORIDE 0.9 % IV SOLN
125.0000 mg | Freq: Every day | INTRAVENOUS | Status: AC
  Administered 2013-06-04 – 2013-06-05 (×2): 125 mg via INTRAVENOUS
  Filled 2013-06-04 (×3): qty 10

## 2013-06-04 MED ORDER — SODIUM CHLORIDE 0.9 % IV SOLN: 100.0000 mL | INTRAVENOUS | Status: DC | PRN

## 2013-06-04 MED ORDER — ALTEPLASE 100 MG IV SOLR: 2.0000 mg | Freq: Once | INTRAVENOUS | Status: DC

## 2013-06-04 MED ORDER — NEPRO/CARBSTEADY PO LIQD
237.0000 mL | ORAL | Status: DC | PRN
  Filled 2013-06-04: qty 237

## 2013-06-04 MED ORDER — ALTEPLASE 100 MG IV SOLR
4.0000 mg | Freq: Once | INTRAVENOUS | Status: DC
  Filled 2013-06-04: qty 4

## 2013-06-04 MED ORDER — LIDOCAINE-PRILOCAINE 2.5-2.5 % EX CREA
1.0000 "application " | TOPICAL_CREAM | CUTANEOUS | Status: DC | PRN
  Filled 2013-06-04: qty 5

## 2013-06-04 MED ORDER — HEPARIN SODIUM (PORCINE) 1000 UNIT/ML DIALYSIS
20.0000 [IU]/kg | INTRAMUSCULAR | Status: DC | PRN
  Administered 2013-06-04: 2400 [IU] via INTRAVENOUS_CENTRAL
  Filled 2013-06-04: qty 3

## 2013-06-04 NOTE — Progress Notes (Signed)
Subjective:  UOP remains poor, BUN and creatinine worse- pt very somnolent. Need to proceed with HD today.   Objective Vital signs in last 24 hours: Filed Vitals:   06/03/13 1721 06/03/13 1757 06/03/13 2143 06/04/13 0600  BP: 109/48 141/57 130/51 118/50  Pulse: 62  61 56  Temp:  97.8 F (36.6 C) 97.6 F (36.4 C) 98.4 F (36.9 C)  TempSrc:      Resp:  16 18 16   SpO2:  93% 93% 100%   Weight change:   Intake/Output Summary (Last 24 hours) at 06/04/13 0948 Last data filed at 06/04/13 0521  Gross per 24 hour  Intake     50 ml  Output    153 ml  Net   -103 ml   Labs: Basic Metabolic Panel:  Recent Labs Lab 06/02/13 0620 06/03/13 0545 06/04/13 0600  NA 133* 136 134*  K 4.7 5.0 5.1  CL 100 101 100  CO2 16* 18* 17*  GLUCOSE 132* 148* 202*  BUN 77* 82* 90*  CREATININE 6.50* 7.18* 7.23*  CALCIUM 8.0* 8.6 8.8  PHOS  --  8.7* 9.9*   Liver Function Tests:  Recent Labs Lab 06/03/13 0545 06/04/13 0600  ALBUMIN 2.1* 2.1*   No results found for this basename: LIPASE, AMYLASE,  in the last 168 hours  Recent Labs Lab 06/01/13 1942  AMMONIA 23   CBC:  Recent Labs Lab 05/29/13 2116 05/30/13 0405 05/31/13 0525 06/01/13 0535 06/02/13 0620 06/04/13 0600  WBC 12.0* 10.7* 11.8* 12.1* 10.2 10.9*  NEUTROABS 8.5*  --   --   --   --   --   HGB 7.9* 7.4* 6.5* 7.6* 7.4* 7.6*  HCT 23.8* 22.4* 19.6* 23.2* 23.0* 23.3*  MCV 83.5 84.5 84.8 84.1 86.1 85.3  PLT 198 189 176 194 184 241   Cardiac Enzymes: No results found for this basename: CKTOTAL, CKMB, CKMBINDEX, TROPONINI,  in the last 168 hours CBG:  Recent Labs Lab 06/03/13 0622 06/03/13 1210 06/03/13 1647 06/03/13 2158 06/04/13 0654  GLUCAP 156* 108* 205* 172* 198*    Iron Studies:   Recent Labs  06/03/13 0545  IRON 15*  TIBC 192*   Studies/Results: No results found. Medications: Infusions:    Scheduled Medications: . aspirin EC  81 mg Oral Daily  . atorvastatin  40 mg Oral q1800  . carvedilol   12.5 mg Oral BID WC  . darbepoetin (ARANESP) injection - NON-DIALYSIS  100 mcg Subcutaneous Q Wed-1800  . insulin aspart  0-15 Units Subcutaneous TID WC  . insulin aspart  0-5 Units Subcutaneous QHS  . omega-3 acid ethyl esters  2 g Oral Daily  . piperacillin-tazobactam (ZOSYN)  IV  2.25 g Intravenous Q8H    have reviewed scheduled and prn medications.  Physical Exam: General: more cooperative with family at bedside today Heart: RRR Lungs: poor effort, no wheezes Abdomen: obese, soft, non tender Extremities: some edema left second toe dry gangrene but maybe some extension into midfoot  Assessment/Plan: 62 year old BF with baseline CKD now presents with A on CRF in the setting of hypotension on an ARB and NSAIDS as well as sepsis from gangrenous toe  1.Renal- A on CRF (baseline creatinine is 2) in the setting of above. Appropriate management has taken place, ARB stopped and BP is better.  Unfortunately UOP is not great and numbers have worsened from yesterday to today and is difficult to tell but seems uremic. U/A consistent with ATN but could also have UTI, culture  pending.   Planning on placing a nontunnelled VC today and proceed with first HD treatment today.    I am still hopeful that this insult will eventually be reversible.  2. Hypertension/volume - pt certainly does not appear volume overloaded but is more in than out, gently UF.  3. Anemia - has been an issue this hosp - she appears to have already required a transfusion. Have added aranesp and will replete iron.  4. Toe gangrene- on zosyn and vanc- vanc trough high, could be part of etiology of AKI- doses being held- Dr. Gwenlyn Found has seen. Obviously does not want to do an arteriogram in the setting of AKI- he thinks this toe will autoamputate.      [name] A   06/04/2013,9:48 AM  LOS: 6 days

## 2013-06-04 NOTE — Progress Notes (Signed)
Please refer to Dr. Kennon Holter consult note. At this time, focus is on acute dialysis. Agree with antibiotics, no plans in the near future for peripheral angiogram. Please contact us if we can be of further assistance. We will sign-off for now.  Pixie Casino, MD, [facility] Attending Cardiologist [facility]

## 2013-06-04 NOTE — Progress Notes (Signed)
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)  No Known Allergies Patient Measurements: Weight: 261 lb 3.9 oz (118.5 kg) Weight ~ 107 kg Height ~ 62 inches Vital Signs: Temp: 97.9 F (36.6 C) (07/17 1512) Temp src: Oral (07/17 1512) BP: 143/63 mmHg (07/17 1700) Pulse Rate: 66 (07/17 1700) Intake/Output from previous day: 07/16 0701 - 07/17 0700 In: 50 [P.O.:50] Out: 153 [Urine:150; Stool:3] Labs:  Recent Labs  06/02/13 0620 06/03/13 0545 06/04/13 0600  WBC 10.2  --  10.9*  HGB 7.4*  --  7.6*  PLT 184  --  241  CREATININE 6.50* 7.18* 7.23*   CrCl ~ 10 ml/min   Microbiology: Recent Results (from the past 720 hour(s))  CULTURE, BLOOD (ROUTINE X 2)     Status: None   Collection Time    05/30/13  3:46 PM      Result Value Range Status   Specimen Description BLOOD RIGHT HAND   Final   Special Requests BOTTLES DRAWN AEROBIC ONLY 2CC   Final   Culture  Setup Time 05/30/2013 22:14   Final   Culture     Final   Value:        BLOOD CULTURE RECEIVED NO GROWTH TO DATE CULTURE WILL BE HELD FOR 5 DAYS BEFORE ISSUING A FINAL NEGATIVE REPORT   Report Status PENDING   Incomplete  URINE CULTURE     Status: None   Collection Time    05/31/13  8:34 PM      Result Value Range Status   Specimen Description URINE, RANDOM   Final   Special Requests NONE   Final   Culture  Setup Time 06/01/2013 02:48   Final   Colony Count NO GROWTH   Final   Culture NO GROWTH   Final   Report Status 06/02/2013 FINAL   Final  CULTURE, BLOOD (ROUTINE X 2)     Status: None   Collection Time    06/01/13  7:30 AM      Result Value Range Status   Specimen Description BLOOD LEFT ARM   Final   Special Requests BOTTLES DRAWN AEROBIC ONLY 10CC   Final   Culture  Setup Time 06/01/2013 14:17   Final   Culture     Final   Value:        BLOOD CULTURE RECEIVED NO GROWTH TO DATE CULTURE WILL BE HELD FOR 5 DAYS BEFORE ISSUING A FINAL NEGATIVE  REPORT   Report Status PENDING   Incomplete  CULTURE, BLOOD (ROUTINE X 2)     Status: None   Collection Time    06/01/13  7:35 AM      Result Value Range Status   Specimen Description BLOOD LEFT HAND   Final   Special Requests BOTTLES DRAWN AEROBIC ONLY 10CC   Final   Culture  Setup Time 06/01/2013 14:16   Final   Culture     Final   Value:        BLOOD CULTURE RECEIVED NO GROWTH TO DATE CULTURE WILL BE HELD FOR 5 DAYS BEFORE ISSUING A FINAL NEGATIVE REPORT   Report Status PENDING   Incomplete  URINE CULTURE     Status: None   Collection Time    06/03/13 12:15 PM      Result Value Range Status   Specimen Description URINE, CATHETERIZED   Final   Special Requests NONE   Final   Culture  Setup Time  06/03/2013 13:03   Final   Colony Count NO GROWTH   Final   Culture NO GROWTH   Final   Report Status 06/04/2013 FINAL   Final    Anti-infectives   Start     Dose/Rate Route Frequency Ordered Stop   06/01/13 2200  piperacillin-tazobactam (ZOSYN) IVPB 2.25 g     2.25 g 100 mL/hr over 30 Minutes Intravenous 3 times per day 06/01/13 1402     06/01/13 0000  vancomycin (VANCOCIN) 1,500 mg in sodium chloride 0.9 % 500 mL IVPB  Status:  Discontinued     1,500 mg 250 mL/hr over 120 Minutes Intravenous Every 48 hours 05/30/13 0054 06/02/13 1132   05/30/13 0600  piperacillin-tazobactam (ZOSYN) IVPB 3.375 g  Status:  Discontinued     3.375 g 12.5 mL/hr over 240 Minutes Intravenous 3 times per day 05/30/13 0249 06/01/13 1402   05/30/13 0100  vancomycin (VANCOCIN) 2,000 mg in sodium chloride 0.9 % 500 mL IVPB     2,000 mg 250 mL/hr over 120 Minutes Intravenous  Once 05/30/13 0029 05/30/13 0331   05/30/13 0015  piperacillin-tazobactam (ZOSYN) IVPB 3.375 g     3.375 g 12.5 mL/hr over 240 Minutes Intravenous  Once 05/30/13 0004 05/30/13 0101     Assessment: 62 yo F admitted 05/29/13 with several week history of toe infection not improving on oral antibiotics.  Per MD notes, expect toe will auto  amputate and no plans for surgical or vascular intervention at this time.  SCr continues to increase as a result of AKI.  Patient receiving HD today- this is her first HD session with a BFR of 250. Patient is still making urine but decreasing.  Last Vancomycin dose 7/14 at 0200.   Vanc random level 7/16 was 26.2.   Vanc random level today was 21.3 (36 hours later)-pre-HD.   Goal of Therapy:  Vancomycin trough level 10-15 mcg/ml Renal dose adjustment of medications.  Plan:  Continue to hold Vancomycin doses as targeting lower trough goal.   Follow up renal plans for further HD and plan for a level 4 hours post-HD if HD tomorrow. If no HD tomorrow, would consider rechecking random vancomycin level on 7/19 AM based on current levels.  Follow WBC, fever, culture data, and clinical course.   Sloan Leiter, PharmD, BCPS Clinical Pharmacist [phone] 06/04/2013 5:16 PM

## 2013-06-04 NOTE — Procedures (Signed)
Temp RIJV HD catheter Tip SVC RA No comp

## 2013-06-04 NOTE — Progress Notes (Signed)
Patient ID: [name]  female  [id number]    DOB: Jan 15, 1951    DOA: 05/29/2013  PCP: Hayden Rasmussen., MD  Assessment/Plan:  AKI on CKD5  - No significant improvement, UA consistent with ATN, urine cultures pending  - renal U/S did not show any acute hydronephrosis or obstruction - Temporary HD catheter placed by IR today - Started on first hemodialysis today, seen during HD, tolerating  Anemia - transfuse when < 7  - Transfuse 2 units on 7/13, cont aranesp   Type II or unspecified type diabetes mellitus with renal manifestations, uncontrolled-  - Continue SSI , so far stable today  CAD (coronary artery disease), native coronary artery- stable, no CP   Peripheral arterial disease   H/o CVA   Lethargy- d/c'd neurontin- improving, possibly due to uremia   Toe Gangrene: On vanc and Zosyn per pharmacy, no plans in the future for peripheral angiogram, reviewed cardiology note  DVT Prophylaxis:  Code Status:  Disposition:Unclear, discussed with family in detail     Subjective:  seen during HD, tolerating hemodialysis   Objective: Weight change:   Intake/Output Summary (Last 24 hours) at 06/04/13 1922 Last data filed at 06/04/13 1804  Gross per 24 hour  Intake    240 ml  Output   1002 ml  Net   -762 ml   Blood pressure 152/61, pulse 66, temperature 98.4 F (36.9 C), temperature source Oral, resp. rate 18, height 5\' 2"  (1.575 m), weight 116.7 kg (257 lb 4.4 oz), SpO2 99.00%.  Physical Exam: General: Alert and awake, oriented, not in any acute distress. CVS: S1-S2 clear, no murmur rubs or gallops Chest: clear to auscultation bilaterally, no wheezing, rales or rhonchi Abdomen: Obese, soft nontender, nondistended, normal bowel sounds  Extremities: no cyanosis, clubbing. Left second toe dry gangrene   Lab Results: Basic Metabolic Panel:  Recent Labs Lab 06/03/13 0545 06/04/13 0600  NA 136 134*  K 5.0 5.1  CL 101 100  CO2 18* 17*  GLUCOSE 148* 202*  BUN  82* 90*  CREATININE 7.18* 7.23*  CALCIUM 8.6 8.8  PHOS 8.7* 9.9*   Liver Function Tests:  Recent Labs Lab 06/03/13 0545 06/04/13 0600  ALBUMIN 2.1* 2.1*   No results found for this basename: LIPASE, AMYLASE,  in the last 168 hours  Recent Labs Lab 06/01/13 1942  AMMONIA 23   CBC:  Recent Labs Lab 05/29/13 2116  06/02/13 0620 06/04/13 0600  WBC 12.0*  < > 10.2 10.9*  NEUTROABS 8.5*  --   --   --   HGB 7.9*  < > 7.4* 7.6*  HCT 23.8*  < > 23.0* 23.3*  MCV 83.5  < > 86.1 85.3  PLT 198  < > 184 241  < > = values in this interval not displayed. CBG:  Recent Labs Lab 06/03/13 1647 06/03/13 2158 06/04/13 0654 06/04/13 1310 06/04/13 1753  GLUCAP 205* 172* 198* 158* 147*     Micro Results: Recent Results (from the past 240 hour(s))  CULTURE, BLOOD (ROUTINE X 2)     Status: None   Collection Time    05/30/13  3:46 PM      Result Value Range Status   Specimen Description BLOOD RIGHT HAND   Final   Special Requests BOTTLES DRAWN AEROBIC ONLY 2CC   Final   Culture  Setup Time 05/30/2013 22:14   Final   Culture     Final   Value:        BLOOD CULTURE  RECEIVED NO GROWTH TO DATE CULTURE WILL BE HELD FOR 5 DAYS BEFORE ISSUING A FINAL NEGATIVE REPORT   Report Status PENDING   Incomplete  URINE CULTURE     Status: None   Collection Time    05/31/13  8:34 PM      Result Value Range Status   Specimen Description URINE, RANDOM   Final   Special Requests NONE   Final   Culture  Setup Time 06/01/2013 02:48   Final   Colony Count NO GROWTH   Final   Culture NO GROWTH   Final   Report Status 06/02/2013 FINAL   Final  CULTURE, BLOOD (ROUTINE X 2)     Status: None   Collection Time    06/01/13  7:30 AM      Result Value Range Status   Specimen Description BLOOD LEFT ARM   Final   Special Requests BOTTLES DRAWN AEROBIC ONLY 10CC   Final   Culture  Setup Time 06/01/2013 14:17   Final   Culture     Final   Value:        BLOOD CULTURE RECEIVED NO GROWTH TO DATE CULTURE  WILL BE HELD FOR 5 DAYS BEFORE ISSUING A FINAL NEGATIVE REPORT   Report Status PENDING   Incomplete  CULTURE, BLOOD (ROUTINE X 2)     Status: None   Collection Time    06/01/13  7:35 AM      Result Value Range Status   Specimen Description BLOOD LEFT HAND   Final   Special Requests BOTTLES DRAWN AEROBIC ONLY 10CC   Final   Culture  Setup Time 06/01/2013 14:16   Final   Culture     Final   Value:        BLOOD CULTURE RECEIVED NO GROWTH TO DATE CULTURE WILL BE HELD FOR 5 DAYS BEFORE ISSUING A FINAL NEGATIVE REPORT   Report Status PENDING   Incomplete  URINE CULTURE     Status: None   Collection Time    06/03/13 12:15 PM      Result Value Range Status   Specimen Description URINE, CATHETERIZED   Final   Special Requests NONE   Final   Culture  Setup Time 06/03/2013 13:03   Final   Colony Count NO GROWTH   Final   Culture NO GROWTH   Final   Report Status 06/04/2013 FINAL   Final    Studies/Results: Ct Head Wo Contrast  05/30/2013   *RADIOLOGY REPORT*  Clinical Data: Left-sided weakness.  CT HEAD WITHOUT CONTRAST  Technique:  Contiguous axial images were obtained from the base of the skull through the vertex without contrast.  Comparison: 03/19/2013.  Findings: Large, old right cerebellar hemisphere infarct.  Old right occipital lobe infarct.  Patchy white matter low density in both cerebral hemispheres.  Minimally enlarged ventricles and subarachnoid spaces.  No intracranial hemorrhage, mass lesion or CT evidence of acute infarction.  Unremarkable bones and paranasal sinuses.  Stable left frontal probable venous lake.  IMPRESSION:  1.  No acute abnormality. 2.  Old infarcts, chronic small vessel white matter ischemic changes and minimal atrophy, as described above.   Original Report Authenticated By: Claudie Revering, M.D.   US Renal  06/01/2013   *RADIOLOGY REPORT*  Clinical Data: Acute renal failure chronic renal disease.  RENAL/URINARY TRACT ULTRASOUND COMPLETE  Comparison:  CT abdomen dated  11/27/2010  Findings: The study is limited by technique and body habitus.  Right Kidney:  11.6 cm in  length.  No hydronephrosis.  Cortical echogenicity is within normal limits.  1.5 cm hypoechoic exophytic lesion from the lower pole of the right kidney is likely a cyst.  Left Kidney:  11.8 cm in length.  No hydronephrosis or mass.  Bladder:  Unremarkable.  IMPRESSION: Probable benign cyst in the lower pole of the right kidney.  No evidence of hydronephrosis.   Original Report Authenticated By: Marybelle Killings, M.D.   Ir Fluoro Guide Cv Line Right  06/04/2013   *RADIOLOGY REPORT*  Clinical Data/Indication: RENAL FAILURE  IR RIGHT FLOURO GUIDE CV LINE,IR ULTRASOUND GUIDANCE VASC ACCESS RIGHT  Fluoroscopy Time: 18 seconds.  Procedure: The procedure, risks, benefits, and alternatives were explained to the patient. Questions regarding the procedure were encouraged and answered. The patient understands and consents to the procedure.  The right neck was prepped with betadine in a sterile fashion, and a sterile drape was applied covering the operative field. A sterile gown and sterile gloves were used for the procedure.  Under sonographic guidance, a micropuncture needle was inserted into the right internal jugular vein and removed over a 0018 wire which was upsized to a three J E.  Sonographic and fluoroscopic documentation was obtained.  The dialysis catheter was then inserted over the wire to the cavoatrial junction, was flushed, was then sewn in place.  Findings: The tip of the dialysis catheter is at cavoatrial junction.  Complications: None.  IMPRESSION: Successful right internal jugular vein temporary dialysis catheter placement.   Original Report Authenticated By: Marybelle Killings, M.D.   Ir US Guide Vasc Access Right  06/04/2013   *RADIOLOGY REPORT*  Clinical Data/Indication: RENAL FAILURE  IR RIGHT FLOURO GUIDE CV LINE,IR ULTRASOUND GUIDANCE VASC ACCESS RIGHT  Fluoroscopy Time: 18 seconds.  Procedure: The procedure,  risks, benefits, and alternatives were explained to the patient. Questions regarding the procedure were encouraged and answered. The patient understands and consents to the procedure.  The right neck was prepped with betadine in a sterile fashion, and a sterile drape was applied covering the operative field. A sterile gown and sterile gloves were used for the procedure.  Under sonographic guidance, a micropuncture needle was inserted into the right internal jugular vein and removed over a 0018 wire which was upsized to a three J E.  Sonographic and fluoroscopic documentation was obtained.  The dialysis catheter was then inserted over the wire to the cavoatrial junction, was flushed, was then sewn in place.  Findings: The tip of the dialysis catheter is at cavoatrial junction.  Complications: None.  IMPRESSION: Successful right internal jugular vein temporary dialysis catheter placement.   Original Report Authenticated By: Marybelle Killings, M.D.   Dg Toe 2nd Left  05/30/2013   *RADIOLOGY REPORT*  Clinical Data: Wound infection of second digit left foot.  LEFT SECOND TOE  Comparison: None.  Findings: Diffuse vascular calcifications in the left foot.  No radiopaque foreign bodies or gas collections in the soft tissues. Bones appear intact.  No evidence of bone erosion or cortical irregularity to suggest osteomyelitis.  No acute fracture or subluxation.  Diffuse bone demineralization.  IMPRESSION: No acute bony abnormalities.  No evidence of osteomyelitis.   Original Report Authenticated By: Lucienne Capers, M.D.    Medications: Scheduled Meds: . alteplase  4 mg Intracatheter Once  . aspirin EC  81 mg Oral Daily  . atorvastatin  40 mg Oral q1800  . carvedilol  12.5 mg Oral BID WC  . darbepoetin (ARANESP) injection - NON-DIALYSIS  100 mcg Subcutaneous Q  Wed-1800  . ferric gluconate (FERRLECIT/NULECIT) IV  125 mg Intravenous Daily  . insulin aspart  0-15 Units Subcutaneous TID WC  . insulin aspart  0-5 Units  Subcutaneous QHS  . omega-3 acid ethyl esters  2 g Oral Daily  . piperacillin-tazobactam (ZOSYN)  IV  2.25 g Intravenous Q8H      LOS: 6 days   RAI,RIPUDEEP M.D. [facility] 06/04/2013, 7:22 PM Pager: [phone]  If 7PM-7AM, please contact night-coverage [email or web address] Password [email or web address]

## 2013-06-04 NOTE — Procedures (Signed)
1st HD today  Temp cath initially non-functional, kinked; pulled back by IR Running now at BFR 250 UF goal 1 liter BP 114/56 Heparin 2300 units Tolerating well thus far (planned for 2 hours total treatment time today)/cbd

## 2013-06-04 NOTE — H&P (Signed)
Referring Physician: Dr. Moshe Cipro HPI: [name] is an 62 y.o. female with PMHx of CKD baseline 1.3-1.5, CAD, HTN, HLP, DM. Patient has AKI on CKD secondary to hypotension and gangrenous toe. She presents this admission with left second toe ulcer which has been going on x several months. Patient had been on PO antibiotics and is currently on IV antibiotics. Blood cultures show no growth. WBC 10.9 today and patient afebrile. Cr. Today 7.23, urine output difficult to assess given incontinence and patient has been confused per chart review, questionable uremia.   Past Medical History:  Past Medical History  Diagnosis Date  . ST elevation myocardial infarction (STEMI) of inferior wall  03/2009    Ostial/proximal RCA occlusion treated with BMS stent  . CAD (coronary artery disease), native coronary artery      status post PCI to the RCA for inferior STEMI  . Cancer     endo ca  . Hypertension associated with diabetes   . Hyperlipidemia LDL goal <70   . Diabetes mellitus, type II, insulin dependent     With complications - CAD, CVA, peripheral ulcer ,  . Stroke, acute, within 8 weeks  4/30/ 2014    Right-sided weakness, mostly with balance issues and only mild weakness.  . Diabetic peripheral neuropathy associated with type 2 diabetes mellitus   . Obesity, Class III, BMI 40-49.9 (morbid obesity)     BMI 43; 5' 2',  235 pounds 6.4 ounces  . CKD (chronic kidney disease), stage III     Past Surgical History:  Past Surgical History  Procedure Laterality Date  . Abdominal hysterectomy    . Leg tendon surgery      patient fell in a store right leg  . Leg surgery      hole in bone( during Childhood)  . Cardiac catheterization  03/31/2009  . Coronary angioplasty  03/31/2009    PTCA , bare-metal stent 3.5 mm x 24 mm ostium of RCA ,EF 55%    Family History:  Family History  Problem Relation Age of Onset  . Alcoholism Mother     Died from complications  . Alcoholism Father     Died  from complications  . Heart disease      Unknown, unclear. Patient is not a good historian.  Essentially no pertinent history known    Social History:  reports that she quit smoking about 18 years ago. She has never used smokeless tobacco. She reports that she does not drink alcohol or use illicit drugs.  Allergies: No Known Allergies     Medication List    ASK your doctor about these medications       amLODipine 5 MG tablet  Commonly known as:  NORVASC  Take 1 tablet (5 mg total) by mouth daily at 12 noon.     amoxicillin-clavulanate 875-125 MG per tablet  Commonly known as:  AUGMENTIN  Take 1 tablet by mouth 2 (two) times daily.     aspirin EC 81 MG tablet  Take 81 mg by mouth daily.     atorvastatin 40 MG tablet  Commonly known as:  LIPITOR  Take 1 tablet (40 mg total) by mouth daily at 6 PM.     carvedilol 12.5 MG tablet  Commonly known as:  COREG  Take 12.5 mg by mouth 2 (two) times daily with a meal.     gabapentin 100 MG capsule  Commonly known as:  NEURONTIN  Take 100 mg by mouth 3 (three) times daily.  insulin glargine 100 UNIT/ML injection  Commonly known as:  LANTUS  Inject 48 Units into the skin at bedtime.     losartan-hydrochlorothiazide 100-25 MG per tablet  Commonly known as:  HYZAAR  Take 1 tablet by mouth daily.     omega-3 acid ethyl esters 1 G capsule  Commonly known as:  LOVAZA  Take 2 g by mouth daily.     silver sulfADIAZINE 1 % cream  Commonly known as:  SILVADENE  Apply 1 application topically daily. Apply to toe        Please HPI for pertinent positives, otherwise complete 10 system ROS negative.  Physical Exam: BP 118/50  Pulse 56  Temp(Src) 98.4 F (36.9 C) (Oral)  Resp 16  SpO2 100% There is no weight on file to calculate BMI.   General Appearance:  Groggy, cooperative, no distress  Head:  Normocephalic, without obvious abnormality, atraumatic  ENT: Unremarkable  Neck: Supple, symmetrical, trachea midline  Lungs:    Clear to auscultation bilaterally, no w/r/r, respirations unlabored without use of accessory muscles.  Chest Wall:  No tenderness or deformity  Heart:  Regular rate and rhythm, S1, S2 normal, no murmur, rub or gallop.  Abdomen:   Obese, soft, non-tender, non distended.  Extremities: Gangrenous left second digit, no cyanosis or edema   Results for orders placed during the hospital encounter of 05/29/13 (from the past 48 hour(s))  GLUCOSE, CAPILLARY     Status: Abnormal   Collection Time    06/02/13 11:21 AM      Result Value Range   Glucose-Capillary 177 (*) 70 - 99 mg/dL  GLUCOSE, CAPILLARY     Status: Abnormal   Collection Time    06/02/13  5:31 PM      Result Value Range   Glucose-Capillary 216 (*) 70 - 99 mg/dL  NA AND K (SODIUM & POTASSIUM), RAND UR     Status: None   Collection Time    06/02/13  7:42 PM      Result Value Range   Sodium, Ur 15     Potassium Urine Timed 59    GLUCOSE, CAPILLARY     Status: Abnormal   Collection Time    06/02/13 10:15 PM      Result Value Range   Glucose-Capillary 206 (*) 70 - 99 mg/dL  VANCOMYCIN, RANDOM     Status: None   Collection Time    06/03/13  5:45 AM      Result Value Range   Vancomycin Rm 26.2     Comment:            Random Vancomycin therapeutic     range is dependent on dosage and     time of specimen collection.     A peak range is 20.0-40.0 ug/mL     A trough range is 5.0-15.0 ug/mL             RENAL FUNCTION PANEL     Status: Abnormal   Collection Time    06/03/13  5:45 AM      Result Value Range   Sodium 136  135 - 145 mEq/L   Potassium 5.0  3.5 - 5.1 mEq/L   Chloride 101  96 - 112 mEq/L   CO2 18 (*) 19 - 32 mEq/L   Glucose, Bld 148 (*) 70 - 99 mg/dL   BUN 82 (*) 6 - 23 mg/dL   Creatinine, Ser 7.18 (*) 0.50 - 1.10 mg/dL   Calcium 8.6  8.4 -  10.5 mg/dL   Phosphorus 8.7 (*) 2.3 - 4.6 mg/dL   Albumin 2.1 (*) 3.5 - 5.2 g/dL   GFR calc non Af Amer 5 (*) >90 mL/min   GFR calc Af Amer 6 (*) >90 mL/min   Comment:             The eGFR has been calculated     using the CKD EPI equation.     This calculation has not been     validated in all clinical     situations.     eGFR's persistently     <90 mL/min signify     possible Chronic Kidney Disease.  IRON AND TIBC     Status: Abnormal   Collection Time    06/03/13  5:45 AM      Result Value Range   Iron 15 (*) 42 - 135 ug/dL   TIBC 192 (*) 250 - 470 ug/dL   Saturation Ratios 8 (*) 20 - 55 %   UIBC 177  125 - 400 ug/dL  GLUCOSE, CAPILLARY     Status: Abnormal   Collection Time    06/03/13  6:22 AM      Result Value Range   Glucose-Capillary 156 (*) 70 - 99 mg/dL  GLUCOSE, CAPILLARY     Status: Abnormal   Collection Time    06/03/13 12:10 PM      Result Value Range   Glucose-Capillary 108 (*) 70 - 99 mg/dL   Comment 1 Notify RN    URINALYSIS, ROUTINE W REFLEX MICROSCOPIC     Status: Abnormal   Collection Time    06/03/13 12:15 PM      Result Value Range   Color, Urine RED (*) YELLOW   Comment: BIOCHEMICALS MAY BE AFFECTED BY COLOR   APPearance TURBID (*) CLEAR   Specific Gravity, Urine 1.022  1.005 - 1.030   pH 5.0  5.0 - 8.0   Glucose, UA NEGATIVE  NEGATIVE mg/dL   Hgb urine dipstick LARGE (*) NEGATIVE   Bilirubin Urine SMALL (*) NEGATIVE   Ketones, ur 15 (*) NEGATIVE mg/dL   Protein, ur 100 (*) NEGATIVE mg/dL   Urobilinogen, UA 0.2  0.0 - 1.0 mg/dL   Nitrite NEGATIVE  NEGATIVE   Leukocytes, UA MODERATE (*) NEGATIVE  URINE MICROSCOPIC-ADD ON     Status: Abnormal   Collection Time    06/03/13 12:15 PM      Result Value Range   Squamous Epithelial / LPF MANY (*) RARE   WBC, UA 11-20  <3 WBC/hpf   RBC / HPF TOO NUMEROUS TO COUNT  <3 RBC/hpf   Bacteria, UA MANY (*) RARE   Casts GRANULAR CAST (*) NEGATIVE   Urine-Other AMORPHOUS URATES/PHOSPHATES     Comment: LESS THAN 10 mL OF URINE SUBMITTED  GLUCOSE, CAPILLARY     Status: Abnormal   Collection Time    06/03/13  4:47 PM      Result Value Range   Glucose-Capillary 205 (*) 70 -  99 mg/dL   Comment 1 Documented in Chart     Comment 2 Notify RN    HEPATITIS B SURFACE ANTIGEN     Status: None   Collection Time    06/03/13  5:30 PM      Result Value Range   Hepatitis B Surface Ag NEGATIVE  NEGATIVE  HEPATITIS C ANTIBODY (REFLEX)     Status: None   Collection Time    06/03/13  5:30 PM  Result Value Range   HCV Ab NEGATIVE  NEGATIVE  GLUCOSE, CAPILLARY     Status: Abnormal   Collection Time    06/03/13  9:58 PM      Result Value Range   Glucose-Capillary 172 (*) 70 - 99 mg/dL  RENAL FUNCTION PANEL     Status: Abnormal   Collection Time    06/04/13  6:00 AM      Result Value Range   Sodium 134 (*) 135 - 145 mEq/L   Potassium 5.1  3.5 - 5.1 mEq/L   Chloride 100  96 - 112 mEq/L   CO2 17 (*) 19 - 32 mEq/L   Glucose, Bld 202 (*) 70 - 99 mg/dL   BUN 90 (*) 6 - 23 mg/dL   Creatinine, Ser 7.23 (*) 0.50 - 1.10 mg/dL   Calcium 8.8  8.4 - 10.5 mg/dL   Phosphorus 9.9 (*) 2.3 - 4.6 mg/dL   Albumin 2.1 (*) 3.5 - 5.2 g/dL   GFR calc non Af Amer 5 (*) >90 mL/min   GFR calc Af Amer 6 (*) >90 mL/min   Comment:            The eGFR has been calculated     using the CKD EPI equation.     This calculation has not been     validated in all clinical     situations.     eGFR's persistently     <90 mL/min signify     possible Chronic Kidney Disease.  CBC     Status: Abnormal   Collection Time    06/04/13  6:00 AM      Result Value Range   WBC 10.9 (*) 4.0 - 10.5 K/uL   RBC 2.73 (*) 3.87 - 5.11 MIL/uL   Hemoglobin 7.6 (*) 12.0 - 15.0 g/dL   HCT 23.3 (*) 36.0 - 46.0 %   MCV 85.3  78.0 - 100.0 fL   MCH 27.8  26.0 - 34.0 pg   MCHC 32.6  30.0 - 36.0 g/dL   RDW 16.7 (*) 11.5 - 15.5 %   Platelets 241  150 - 400 K/uL  GLUCOSE, CAPILLARY     Status: Abnormal   Collection Time    06/04/13  6:54 AM      Result Value Range   Glucose-Capillary 198 (*) 70 - 99 mg/dL   No results found.  Assessment/Plan AKI on CKD baseline 1.3-1.5 in setting of hypotension and  gangrenous toe. Cr. Today 7.23 Will proceed with temporary HD Catheter placement. Keep patient NPO, labs reviewed.  Risks and Benefits discussed with the patient and her daughter. All of the patient's questions were answered, patient is agreeable to proceed. Consent signed by daughter and in chart.   Tsosie Billing D PA-C 06/04/2013, 8:22 AM

## 2013-06-05 LAB — GLUCOSE, CAPILLARY
Glucose-Capillary: 209 mg/dL — ABNORMAL HIGH (ref 70–99)
Glucose-Capillary: 242 mg/dL — ABNORMAL HIGH (ref 70–99)
Glucose-Capillary: 330 mg/dL — ABNORMAL HIGH (ref 70–99)
Glucose-Capillary: 244 mg/dL — ABNORMAL HIGH (ref 70–99)

## 2013-06-05 LAB — CULTURE, BLOOD (ROUTINE X 2): Culture: NO GROWTH

## 2013-06-05 LAB — HEPATITIS B SURFACE ANTIBODY,QUALITATIVE: Hep B S Ab: NEGATIVE

## 2013-06-05 MED ORDER — LORAZEPAM 1 MG PO TABS
1.0000 mg | ORAL_TABLET | Freq: Two times a day (BID) | ORAL | Status: DC | PRN
  Administered 2013-06-05 – 2013-06-09 (×2): 1 mg via ORAL
  Filled 2013-06-05 (×2): qty 1

## 2013-06-05 MED FILL — Heparin Sodium (Porcine) Inj 1000 Unit/ML: INTRAMUSCULAR | Qty: 10 | Status: AC

## 2013-06-05 NOTE — Progress Notes (Signed)
Received ankle brachial index order at 1156. This study was completed on 04/21/2013, and a left lower extremity arterial duplex was completed on 06/02/2013. Results can be found in results review. A repeated ABI will likely have little clinical significance.  Attempted to page Dr.Rai with no call back. We will place study on hold unless informed of change in patient condition.  06/05/2013 12:09 PM Maudry Mayhew, RVT, RDCS, RDMS

## 2013-06-05 NOTE — Progress Notes (Signed)
Utilization review completed.  

## 2013-06-05 NOTE — Progress Notes (Signed)
Speech Language Pathology Dysphagia Treatment Patient Details Name: [name] MRN: [id number] DOB: [birth year] Today's Date: 06/05/2013 Time: [id number] SLP Time Calculation (min): 9 min  Assessment / Plan / Recommendation Clinical Impression  Pt seen for skilled dysphagia treatment.  Pt with breakfast tray at bedside and with minimal intake consumed.  Pt declined to allow SLP to raise head of bed consistently requesting to sit at edge of bed.  SLP informed pt unable due to inability to sit upright.  Pt also declined to allow SLP to place her dentures stating they taste nasty and that she eats without them in place.  Pt with weak voice today suspect due to pain, also complains of mild shortness of breath.  Largest barrier to "safe" intake is due to poor positioning and pt's mild shortness of breath.  SLP observed pt consuming oatmeal and milk without s/s of aspiration.  SLP reeducated pt to precautions.  SLP to sign off as all education completed.      Diet Recommendation  Continue with Current Diet: Regular;Thin liquid    SLP Plan All goals met   Pertinent Vitals/Pain Low grade fever, clear   Swallowing Goals  SLP Swallowing Goals Patient will utilize recommended strategies during swallow to increase swallowing safety with: Supervision/safety Swallow Study Goal #2 - Progress: Met  General Oral Cavity - Dentition: Edentulous (has dentures, declines to have them placed) Patient Positioning: Partially reclined (pt refused to be sat upright due to pain, breathing)  Oral Cavity - Oral Hygiene Does patient have any of the following "at risk" factors?: Nutritional status - inadequate;Oxygen therapy - cannula, mask, simple oxygen devices Brush patient's teeth BID with toothbrush (using toothpaste with fluoride): Yes Patient is AT RISK - Oral Care Protocol followed (see row info): Yes   Dysphagia Treatment Treatment focused on: Skilled observation of diet tolerance;Patient/family/caregiver  education Treatment Methods/Modalities: Skilled observation Patient observed directly with PO's: Yes Type of PO's observed: Thin liquids;Dysphagia 1 (puree) Feeding: Able to feed self Liquids provided via: Straw Type of cueing: Verbal Amount of cueing: Minimal (to take small bites/sips)   Keller, [place] SLP [phone]

## 2013-06-05 NOTE — Progress Notes (Signed)
Occupational Therapy Treatment Patient Details Name: [name] MRN: [id number] DOB: November 11, 1951 Today's Date: 06/05/2013 Time: [id number] OT Time Calculation (min): 36 min  OT Assessment / Plan / Recommendation  OT comments  Pt making progess; however still lethargic. Will continue to benefit from acute OT with follow up at SNF.  Follow Up Recommendations  SNF       Equipment Recommendations   (TBD at next venue)       Frequency Min 2X/week   Progress towards OT Goals Progress towards OT goals: Progressing toward goals  Plan Discharge plan remains appropriate    Precautions / Restrictions Precautions Precautions: Fall Restrictions Weight Bearing Restrictions: No       ADL  Toilet Transfer: Simulated;+2 Total assistance Toilet Transfer: Patient Percentage: 50% Toilet Transfer Method: Stand pivot Toilet Transfer Equipment:  (bed to recliner on right) Toileting - Clothing Manipulation and Hygiene: Performed;+1 Total assistance Where Assessed - Toileting Clothing Manipulation and Hygiene: Standing Transfers/Ambulation Related to ADLs: total A +2(pt=50%) for sit<>stand (x3)--tow of those times she stood for one minute each--and stand pivot      OT Goals(current goals can now be found in the care plan section)    Visit Information  Last OT Received On: 06/05/13 Assistance Needed: +2 PT/OT Co-Evaluation/Treatment: Yes History of Present Illness: 62 yo female h/o dm, pvd, cad comes in with fighting left toe infection for weeks; Relatively recent CVA affecting right side          Cognition  Cognition Arousal/Alertness: Lethargic Behavior During Therapy: Flat affect Overall Cognitive Status: Impaired/Different from baseline    Mobility  Bed Mobility Bed Mobility: Rolling Right;Rolling Left;Left Sidelying to Sit;Sitting - Scoot to [employer] of Bed Rolling Right: 4: Min assist (to get fully over to help with peri-hygiene) Rolling Left: 3: Mod assist;With rail Left  Sidelying to Sit: 3: Mod assist;With rails;HOB flat Sitting - Scoot to Edge of Bed: 4: Min guard Transfers Transfers: Sit to Stand;Stand to Sit Sit to Stand: 1: +2 Total assist;From elevated surface;With upper extremity assist;From bed;With armrests;From chair/3-in-1 Sit to Stand: Patient Percentage: 50% Stand to Sit: 3: Mod assist;With upper extremity assist;With armrests;To chair/3-in-1 Stand to Sit: Patient Percentage: 50% Details for Transfer Assistance: Stood for 1 minute x2 trials with VC's to stand up straight and VCs for safe hand placement for sit<>stand          End of Session OT - End of Session Equipment Utilized During Treatment: Gait belt Activity Tolerance: Patient limited by fatigue;Patient limited by lethargy Patient left: in chair;with call bell/phone within reach;with chair alarm set Nurse Communication:  (NT=+2 stand turn)       Almon Register [id number] 06/05/2013, 10:58 AM

## 2013-06-05 NOTE — Progress Notes (Signed)
Physical Therapy Treatment Patient Details Name: [name] MRN: [id number] DOB: 09-24-1951 Today's Date: 06/05/2013 Time: [id number] PT Time Calculation (min): 29 min  PT Assessment / Plan / Recommendation  PT Comments   Pt with better participation today however still lethargic.  Pt able to progress sit to stands and pivot to recliner with +2 assist.   Follow Up Recommendations  SNF;Supervision/Assistance - 24 hour     Does the patient have the potential to tolerate intense rehabilitation     Barriers to Discharge        Equipment Recommendations  Wheelchair (measurements PT);Wheelchair cushion (measurements PT);Hospital bed    Recommendations for Other Services    Frequency     Progress towards PT Goals Progress towards PT goals: Progressing toward goals  Plan Current plan remains appropriate    Precautions / Restrictions Precautions Precautions: Fall Restrictions Weight Bearing Restrictions: No   Pertinent Vitals/Pain States her "bottom" hurts, pain felt better and reports breathing better upon sitting upright in recliner   Mobility  Bed Mobility Bed Mobility: Rolling Right;Rolling Left;Left Sidelying to Sit;Sitting - Scoot to [employer] of Bed Rolling Right: 4: Min assist (to get fully over to help with peri-hygiene) Rolling Left: 3: Mod assist;With rail Left Sidelying to Sit: 3: Mod assist;With rails;HOB flat Sitting - Scoot to Edge of Bed: 4: Min guard Details for Bed Mobility Assistance: verbal cues for technique, assist mostly for trunk and hips Transfers Transfers: Sit to Stand;Stand to Sit;Stand Pivot Transfers Sit to Stand: 1: +2 Total assist;From elevated surface;With upper extremity assist;From bed;With armrests;From chair/3-in-1 Sit to Stand: Patient Percentage: 50% Stand to Sit: With upper extremity assist;With armrests;To chair/3-in-1;1: +2 Total assist Stand to Sit: Patient Percentage: 50% Stand Pivot Transfers: 1: +2 Total assist Stand Pivot Transfers:  Patient Percentage: 50% Details for Transfer Assistance: Stood for 1 minute x2 trials with verbal cues for posture and full extension as well as for safe hand placement for sit<>stands Ambulation/Gait Ambulation/Gait Assistance: Not tested (comment)    Exercises     PT Diagnosis:    PT Problem List:   PT Treatment Interventions:     PT Goals (current goals can now be found in the care plan section)    Visit Information  Last PT Received On: 06/05/13 Assistance Needed: +2 PT/OT Co-Evaluation/Treatment: Yes History of Present Illness: 63 yo female h/o dm, pvd, cad comes in with fighting left toe infection for weeks; Relatively recent CVA affecting right side    Subjective Data      Cognition  Cognition Arousal/Alertness: Lethargic Behavior During Therapy: Flat affect Overall Cognitive Status: Impaired/Different from baseline    Balance     End of Session PT - End of Session Equipment Utilized During Treatment: Gait belt Activity Tolerance: Patient limited by lethargy;Patient limited by fatigue Patient left: in chair;with call bell/phone within reach;with chair alarm set   GP     [name],[name] 06/05/2013, 1:29 PM Carmelia Bake, PT, DPT 06/05/2013 Pager: [phone]

## 2013-06-05 NOTE — Progress Notes (Signed)
Subjective:  Pt s/p her first HD treatment yest,  Is more animated and says she feels better- also made 825 of urine last night and has a good bit more in her foleey bag.  No labs this AM   Objective Vital signs in last 24 hours: Filed Vitals:   06/04/13 1802 06/04/13 2057 06/05/13 0458 06/05/13 0957  BP: 152/61 108/39 130/52 146/80  Pulse: 66 64 65 64  Temp: 98.4 F (36.9 C) 97.9 F (36.6 C) 99.2 F (37.3 C) 98.2 F (36.8 C)  TempSrc: Oral Oral Oral Oral  Resp: 18 18 18 18   Height: 5\' 2"  (1.575 m) 5\' 2"  (1.575 m)    Weight: 116.7 kg (257 lb 4.4 oz) 116.699 kg (257 lb 4.4 oz)    SpO2: 99% 100% 100% 97%   Weight change:   Intake/Output Summary (Last 24 hours) at 06/05/13 1109 Last data filed at 06/05/13 0647  Gross per 24 hour  Intake    600 ml  Output   1826 ml  Net  -1226 ml   Labs: Basic Metabolic Panel:  Recent Labs Lab 06/02/13 0620 06/03/13 0545 06/04/13 0600  NA 133* 136 134*  K 4.7 5.0 5.1  CL 100 101 100  CO2 16* 18* 17*  GLUCOSE 132* 148* 202*  BUN 77* 82* 90*  CREATININE 6.50* 7.18* 7.23*  CALCIUM 8.0* 8.6 8.8  PHOS  --  8.7* 9.9*   Liver Function Tests:  Recent Labs Lab 06/03/13 0545 06/04/13 0600  ALBUMIN 2.1* 2.1*   No results found for this basename: LIPASE, AMYLASE,  in the last 168 hours  Recent Labs Lab 06/01/13 1942  AMMONIA 23   CBC:  Recent Labs Lab 05/29/13 2116 05/30/13 0405 05/31/13 0525 06/01/13 0535 06/02/13 0620 06/04/13 0600  WBC 12.0* 10.7* 11.8* 12.1* 10.2 10.9*  NEUTROABS 8.5*  --   --   --   --   --   HGB 7.9* 7.4* 6.5* 7.6* 7.4* 7.6*  HCT 23.8* 22.4* 19.6* 23.2* 23.0* 23.3*  MCV 83.5 84.5 84.8 84.1 86.1 85.3  PLT 198 189 176 194 184 241   Cardiac Enzymes: No results found for this basename: CKTOTAL, CKMB, CKMBINDEX, TROPONINI,  in the last 168 hours CBG:  Recent Labs Lab 06/04/13 0654 06/04/13 1310 06/04/13 1753 06/04/13 2055 06/05/13 0748  GLUCAP 198* 158* 147* 174* 209*    Iron Studies:    Recent Labs  06/03/13 0545  IRON 15*  TIBC 192*   Studies/Results: Ir Fluoro Guide Cv Line Right  06/04/2013   *RADIOLOGY REPORT*  Clinical Data/Indication: RENAL FAILURE  IR RIGHT FLOURO GUIDE CV LINE,IR ULTRASOUND GUIDANCE VASC ACCESS RIGHT  Fluoroscopy Time: 18 seconds.  Procedure: The procedure, risks, benefits, and alternatives were explained to the patient. Questions regarding the procedure were encouraged and answered. The patient understands and consents to the procedure.  The right neck was prepped with betadine in a sterile fashion, and a sterile drape was applied covering the operative field. A sterile gown and sterile gloves were used for the procedure.  Under sonographic guidance, a micropuncture needle was inserted into the right internal jugular vein and removed over a 0018 wire which was upsized to a three J E.  Sonographic and fluoroscopic documentation was obtained.  The dialysis catheter was then inserted over the wire to the cavoatrial junction, was flushed, was then sewn in place.  Findings: The tip of the dialysis catheter is at cavoatrial junction.  Complications: None.  IMPRESSION: Successful right internal jugular  vein temporary dialysis catheter placement.   Original Report Authenticated By: Marybelle Killings, M.D.   Ir US Guide Vasc Access Right  06/04/2013   *RADIOLOGY REPORT*  Clinical Data/Indication: RENAL FAILURE  IR RIGHT FLOURO GUIDE CV LINE,IR ULTRASOUND GUIDANCE VASC ACCESS RIGHT  Fluoroscopy Time: 18 seconds.  Procedure: The procedure, risks, benefits, and alternatives were explained to the patient. Questions regarding the procedure were encouraged and answered. The patient understands and consents to the procedure.  The right neck was prepped with betadine in a sterile fashion, and a sterile drape was applied covering the operative field. A sterile gown and sterile gloves were used for the procedure.  Under sonographic guidance, a micropuncture needle was inserted into  the right internal jugular vein and removed over a 0018 wire which was upsized to a three J E.  Sonographic and fluoroscopic documentation was obtained.  The dialysis catheter was then inserted over the wire to the cavoatrial junction, was flushed, was then sewn in place.  Findings: The tip of the dialysis catheter is at cavoatrial junction.  Complications: None.  IMPRESSION: Successful right internal jugular vein temporary dialysis catheter placement.   Original Report Authenticated By: Marybelle Killings, M.D.   Medications: Infusions:    Scheduled Medications: . alteplase  4 mg Intracatheter Once  . aspirin EC  81 mg Oral Daily  . atorvastatin  40 mg Oral q1800  . carvedilol  12.5 mg Oral BID WC  . darbepoetin (ARANESP) injection - NON-DIALYSIS  100 mcg Subcutaneous Q Wed-1800  . ferric gluconate (FERRLECIT/NULECIT) IV  125 mg Intravenous Daily  . insulin aspart  0-15 Units Subcutaneous TID WC  . insulin aspart  0-5 Units Subcutaneous QHS  . omega-3 acid ethyl esters  2 g Oral Daily  . piperacillin-tazobactam (ZOSYN)  IV  2.25 g Intravenous Q8H    have reviewed scheduled and prn medications.  Physical Exam: General: more animated today- best I've seen Heart: RRR Lungs: poor effort, no wheezes Abdomen: obese, soft, non tender Extremities: some edema left second toe dry gangrene but maybe some extension into midfoot  Assessment/Plan: 62 year old BF with baseline CKD now presents with A on CRF in the setting of hypotension on an ARB and NSAIDS as well as sepsis from gangrenous toe  1.Renal- A on CRF (baseline creatinine is 2) in the setting of above. Appropriate management has taken place, ARB stopped and BP is better.  Unfortunately UOP decreased and needed to do HD yesterday for uremia. U/A consistent with ATN , culture negative.   She had excellent UOP overnight which I hope is a good sign.  Will not do any HD today and watch labs and UOP closely.     I am still hopeful that this insult will  eventually be reversible.  2. Hypertension/volume - pt certainly does not appear volume overloaded but is more in than out, gently UF with HD yest.  3. Anemia - has been an issue this hosp - she appears to have already required a transfusion. Have added aranesp and will replete iron.  4. Toe gangrene- on zosyn and vanc- vanc trough high, could be part of etiology of AKI- doses being held- Dr. Gwenlyn Found has seen. Obviously does not want to do an arteriogram in the setting of AKI- he thinks this toe will autoamputate.      [name] A   06/05/2013,11:09 AM  LOS: 7 days

## 2013-06-05 NOTE — Progress Notes (Signed)
Patient ID: [name]  female  [id number]    DOB: [birth year]    DOA: 05/29/2013  PCP: Hayden Rasmussen., MD  Assessment/Plan:  AKI on CKD5  - No significant improvement, UA consistent with ATN, urine cultures pending  - renal U/S did not show any acute hydronephrosis or obstruction - Temporary HD catheter placed by IR, started on first hemodialysis on 7/18.  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 Sharol Given for recommendations, may need toe amputation  H/o CVA   Lethargy- d/c'd neurontin- improving, possibly due to uremia   DVT Prophylaxis:  Code Status:  Disposition: Unclear if she is going to need permanent dialysis    Subjective: No complaints this morning, waking up   Objective: Weight change:   Intake/Output Summary (Last 24 hours) at 06/05/13 0725 Last data filed at 06/05/13 0647  Gross per 24 hour  Intake    600 ml  Output   1826 ml  Net  -1226 ml   Blood pressure 130/52, pulse 65, temperature 99.2 F (37.3 C), temperature source Oral, resp. rate 18, height 5\' 2"  (1.575 m), weight 116.699 kg (257 lb 4.4 oz), SpO2 100.00%.  Physical Exam: General: Alert and awake, oriented, not in any acute distress. CVS: S1-S2 clear  Chest: CTAB no wheezing, rales or rhonchi Abdomen: Obese, soft nontender, nondistended, NBS  Extremities: no cyanosis, clubbing. Left 2nd toe dry gangrene, foul smelling   Lab Results: Basic Metabolic Panel:  Recent Labs Lab 06/03/13 0545 06/04/13 0600  NA 136 134*  K 5.0 5.1   CL 101 100  CO2 18* 17*  GLUCOSE 148* 202*  BUN 82* 90*  CREATININE 7.18* 7.23*  CALCIUM 8.6 8.8  PHOS 8.7* 9.9*   Liver Function Tests:  Recent Labs Lab 06/03/13 0545 06/04/13 0600  ALBUMIN 2.1* 2.1*   No results found for this basename: LIPASE, AMYLASE,  in the last 168 hours  Recent Labs Lab 06/01/13 1942  AMMONIA 23   CBC:  Recent Labs Lab 05/29/13 2116  06/02/13 0620 06/04/13 0600  WBC 12.0*  < > 10.2 10.9*  NEUTROABS 8.5*  --   --   --   HGB 7.9*  < > 7.4* 7.6*  HCT 23.8*  < > 23.0* 23.3*  MCV 83.5  < > 86.1 85.3  PLT 198  < > 184 241  < > = values in this interval not displayed. CBG:  Recent Labs Lab 06/03/13 2158 06/04/13 0654 06/04/13 1310 06/04/13 1753 06/04/13 2055  GLUCAP 172* 198* 158* 147* 174*     Micro Results: Recent Results (from the past 240 hour(s))  CULTURE, BLOOD (ROUTINE X 2)     Status: None   Collection Time    05/30/13  3:46 PM      Result Value Range Status   Specimen Description BLOOD RIGHT HAND   Final   Special Requests BOTTLES DRAWN AEROBIC ONLY 2CC   Final   Culture  Setup Time 05/30/2013 22:14   Final   Culture     Final  Value:        BLOOD CULTURE RECEIVED NO GROWTH TO DATE CULTURE WILL BE HELD FOR 5 DAYS BEFORE ISSUING A FINAL NEGATIVE REPORT   Report Status PENDING   Incomplete  URINE CULTURE     Status: None   Collection Time    05/31/13  8:34 PM      Result Value Range Status   Specimen Description URINE, RANDOM   Final   Special Requests NONE   Final   Culture  Setup Time 06/01/2013 02:48   Final   Colony Count NO GROWTH   Final   Culture NO GROWTH   Final   Report Status 06/02/2013 FINAL   Final  CULTURE, BLOOD (ROUTINE X 2)     Status: None   Collection Time    06/01/13  7:30 AM      Result Value Range Status   Specimen Description BLOOD LEFT ARM   Final   Special Requests BOTTLES DRAWN AEROBIC ONLY 10CC   Final   Culture  Setup Time 06/01/2013 14:17   Final   Culture     Final   Value:         BLOOD CULTURE RECEIVED NO GROWTH TO DATE CULTURE WILL BE HELD FOR 5 DAYS BEFORE ISSUING A FINAL NEGATIVE REPORT   Report Status PENDING   Incomplete  CULTURE, BLOOD (ROUTINE X 2)     Status: None   Collection Time    06/01/13  7:35 AM      Result Value Range Status   Specimen Description BLOOD LEFT HAND   Final   Special Requests BOTTLES DRAWN AEROBIC ONLY 10CC   Final   Culture  Setup Time 06/01/2013 14:16   Final   Culture     Final   Value:        BLOOD CULTURE RECEIVED NO GROWTH TO DATE CULTURE WILL BE HELD FOR 5 DAYS BEFORE ISSUING A FINAL NEGATIVE REPORT   Report Status PENDING   Incomplete  URINE CULTURE     Status: None   Collection Time    06/03/13 12:15 PM      Result Value Range Status   Specimen Description URINE, CATHETERIZED   Final   Special Requests NONE   Final   Culture  Setup Time 06/03/2013 13:03   Final   Colony Count NO GROWTH   Final   Culture NO GROWTH   Final   Report Status 06/04/2013 FINAL   Final    Studies/Results: Ct Head Wo Contrast  05/30/2013   *RADIOLOGY REPORT*  Clinical Data: Left-sided weakness.  CT HEAD WITHOUT CONTRAST  Technique:  Contiguous axial images were obtained from the base of the skull through the vertex without contrast.  Comparison: 03/19/2013.  Findings: Large, old right cerebellar hemisphere infarct.  Old right occipital lobe infarct.  Patchy white matter low density in both cerebral hemispheres.  Minimally enlarged ventricles and subarachnoid spaces.  No intracranial hemorrhage, mass lesion or CT evidence of acute infarction.  Unremarkable bones and paranasal sinuses.  Stable left frontal probable venous lake.  IMPRESSION:  1.  No acute abnormality. 2.  Old infarcts, chronic small vessel white matter ischemic changes and minimal atrophy, as described above.   Original Report Authenticated By: Claudie Revering, M.D.   US Renal  06/01/2013   *RADIOLOGY REPORT*  Clinical Data: Acute renal failure chronic renal disease.  RENAL/URINARY TRACT  ULTRASOUND COMPLETE  Comparison:  CT abdomen dated 11/27/2010  Findings: The study is limited by technique  and body habitus.  Right Kidney:  11.6 cm in length.  No hydronephrosis.  Cortical echogenicity is within normal limits.  1.5 cm hypoechoic exophytic lesion from the lower pole of the right kidney is likely a cyst.  Left Kidney:  11.8 cm in length.  No hydronephrosis or mass.  Bladder:  Unremarkable.  IMPRESSION: Probable benign cyst in the lower pole of the right kidney.  No evidence of hydronephrosis.   Original Report Authenticated By: Marybelle Killings, M.D.   Ir Fluoro Guide Cv Line Right  06/04/2013   *RADIOLOGY REPORT*  Clinical Data/Indication: RENAL FAILURE  IR RIGHT FLOURO GUIDE CV LINE,IR ULTRASOUND GUIDANCE VASC ACCESS RIGHT  Fluoroscopy Time: 18 seconds.  Procedure: The procedure, risks, benefits, and alternatives were explained to the patient. Questions regarding the procedure were encouraged and answered. The patient understands and consents to the procedure.  The right neck was prepped with betadine in a sterile fashion, and a sterile drape was applied covering the operative field. A sterile gown and sterile gloves were used for the procedure.  Under sonographic guidance, a micropuncture needle was inserted into the right internal jugular vein and removed over a 0018 wire which was upsized to a three J E.  Sonographic and fluoroscopic documentation was obtained.  The dialysis catheter was then inserted over the wire to the cavoatrial junction, was flushed, was then sewn in place.  Findings: The tip of the dialysis catheter is at cavoatrial junction.  Complications: None.  IMPRESSION: Successful right internal jugular vein temporary dialysis catheter placement.   Original Report Authenticated By: Marybelle Killings, M.D.   Ir US Guide Vasc Access Right  06/04/2013   *RADIOLOGY REPORT*  Clinical Data/Indication: RENAL FAILURE  IR RIGHT FLOURO GUIDE CV LINE,IR ULTRASOUND GUIDANCE VASC ACCESS RIGHT   Fluoroscopy Time: 18 seconds.  Procedure: The procedure, risks, benefits, and alternatives were explained to the patient. Questions regarding the procedure were encouraged and answered. The patient understands and consents to the procedure.  The right neck was prepped with betadine in a sterile fashion, and a sterile drape was applied covering the operative field. A sterile gown and sterile gloves were used for the procedure.  Under sonographic guidance, a micropuncture needle was inserted into the right internal jugular vein and removed over a 0018 wire which was upsized to a three J E.  Sonographic and fluoroscopic documentation was obtained.  The dialysis catheter was then inserted over the wire to the cavoatrial junction, was flushed, was then sewn in place.  Findings: The tip of the dialysis catheter is at cavoatrial junction.  Complications: None.  IMPRESSION: Successful right internal jugular vein temporary dialysis catheter placement.   Original Report Authenticated By: Marybelle Killings, M.D.   Dg Toe 2nd Left  05/30/2013   *RADIOLOGY REPORT*  Clinical Data: Wound infection of second digit left foot.  LEFT SECOND TOE  Comparison: None.  Findings: Diffuse vascular calcifications in the left foot.  No radiopaque foreign bodies or gas collections in the soft tissues. Bones appear intact.  No evidence of bone erosion or cortical irregularity to suggest osteomyelitis.  No acute fracture or subluxation.  Diffuse bone demineralization.  IMPRESSION: No acute bony abnormalities.  No evidence of osteomyelitis.   Original Report Authenticated By: Lucienne Capers, M.D.    Medications: Scheduled Meds: . alteplase  4 mg Intracatheter Once  . aspirin EC  81 mg Oral Daily  . atorvastatin  40 mg Oral q1800  . carvedilol  12.5 mg Oral BID WC  .  darbepoetin (ARANESP) injection - NON-DIALYSIS  100 mcg Subcutaneous Q Wed-1800  . ferric gluconate (FERRLECIT/NULECIT) IV  125 mg Intravenous Daily  . insulin aspart  0-15  Units Subcutaneous TID WC  . insulin aspart  0-5 Units Subcutaneous QHS  . omega-3 acid ethyl esters  2 g Oral Daily  . piperacillin-tazobactam (ZOSYN)  IV  2.25 g Intravenous Q8H      LOS: 7 days   Aldred Mase M.D. [facility] 06/05/2013, 7:25 AM Pager: [phone]  If 7PM-7AM, please contact night-coverage [email or web address] Password [email or web address]

## 2013-06-05 NOTE — Consult Note (Signed)
Reason for Consult: Dry gangrene left foot second toe Referring Physician: Dr [name] is an 62 y.o. female.  HPI: Patient is a 63 year old woman in stage renal disease type 2 diabetes who states she's had about 2 month history of ulceration to the left foot second toe. She states she has not sought treatment for this.  Past Medical History  Diagnosis Date  . ST elevation myocardial infarction (STEMI) of inferior wall  03/2009    Ostial/proximal RCA occlusion treated with BMS stent  . CAD (coronary artery disease), native coronary artery      status post PCI to the RCA for inferior STEMI  . Cancer     endo ca  . Hypertension associated with diabetes   . Hyperlipidemia LDL goal <70   . Diabetes mellitus, type II, insulin dependent     With complications - CAD, CVA, peripheral ulcer ,  . Stroke, acute, within 8 weeks  4/30/ 2014    Right-sided weakness, mostly with balance issues and only mild weakness.  . Diabetic peripheral neuropathy associated with type 2 diabetes mellitus   . Obesity, Class III, BMI 40-49.9 (morbid obesity)     BMI 43; 5' 2',  235 pounds 6.4 ounces  . CKD (chronic kidney disease), stage III     Past Surgical History  Procedure Laterality Date  . Abdominal hysterectomy    . Leg tendon surgery      patient fell in a store right leg  . Leg surgery      hole in bone( during Childhood)  . Cardiac catheterization  03/31/2009  . Coronary angioplasty  03/31/2009    PTCA , bare-metal stent 3.5 mm x 24 mm ostium of RCA ,EF 55%    Family History  Problem Relation Age of Onset  . Alcoholism Mother     Died from complications  . Alcoholism Father     Died from complications  . Heart disease      Unknown, unclear. Patient is not a good historian.  Essentially no pertinent history known    Social History:  reports that she quit smoking about 18 years ago. She has never used smokeless tobacco. She reports that she does not drink alcohol or use illicit  drugs.  Allergies: No Known Allergies  Medications: I have reviewed the patient's current medications.  Results for orders placed during the hospital encounter of 05/29/13 (from the past 48 hour(s))  GLUCOSE, CAPILLARY     Status: Abnormal   Collection Time    06/03/13  9:58 PM      Result Value Range   Glucose-Capillary 172 (*) 70 - 99 mg/dL  RENAL FUNCTION PANEL     Status: Abnormal   Collection Time    06/04/13  6:00 AM      Result Value Range   Sodium 134 (*) 135 - 145 mEq/L   Potassium 5.1  3.5 - 5.1 mEq/L   Chloride 100  96 - 112 mEq/L   CO2 17 (*) 19 - 32 mEq/L   Glucose, Bld 202 (*) 70 - 99 mg/dL   BUN 90 (*) 6 - 23 mg/dL   Creatinine, Ser 7.23 (*) 0.50 - 1.10 mg/dL   Calcium 8.8  8.4 - 10.5 mg/dL   Phosphorus 9.9 (*) 2.3 - 4.6 mg/dL   Albumin 2.1 (*) 3.5 - 5.2 g/dL   GFR calc non Af Amer 5 (*) >90 mL/min   GFR calc Af Amer 6 (*) >90 mL/min   Comment:  The eGFR has been calculated     using the CKD EPI equation.     This calculation has not been     validated in all clinical     situations.     eGFR's persistently     <90 mL/min signify     possible Chronic Kidney Disease.  CBC     Status: Abnormal   Collection Time    06/04/13  6:00 AM      Result Value Range   WBC 10.9 (*) 4.0 - 10.5 K/uL   RBC 2.73 (*) 3.87 - 5.11 MIL/uL   Hemoglobin 7.6 (*) 12.0 - 15.0 g/dL   HCT 23.3 (*) 36.0 - 46.0 %   MCV 85.3  78.0 - 100.0 fL   MCH 27.8  26.0 - 34.0 pg   MCHC 32.6  30.0 - 36.0 g/dL   RDW 16.7 (*) 11.5 - 15.5 %   Platelets 241  150 - 400 K/uL  GLUCOSE, CAPILLARY     Status: Abnormal   Collection Time    06/04/13  6:54 AM      Result Value Range   Glucose-Capillary 198 (*) 70 - 99 mg/dL  GLUCOSE, CAPILLARY     Status: Abnormal   Collection Time    06/04/13  1:10 PM      Result Value Range   Glucose-Capillary 158 (*) 70 - 99 mg/dL  VANCOMYCIN, RANDOM     Status: None   Collection Time    06/04/13  3:00 PM      Result Value Range   Vancomycin Rm  21.3     Comment:            Random Vancomycin therapeutic     range is dependent on dosage and     time of specimen collection.     A peak range is 20.0-40.0 ug/mL     A trough range is 5.0-15.0 ug/mL             HEPATITIS B SURFACE ANTIBODY     Status: None   Collection Time    06/04/13  3:00 PM      Result Value Range   Hep B S Ab NEGATIVE  NEGATIVE  HEPATITIS B CORE ANTIBODY, TOTAL     Status: None   Collection Time    06/04/13  3:00 PM      Result Value Range   Hep B Core Total Ab NEGATIVE  NEGATIVE  GLUCOSE, CAPILLARY     Status: Abnormal   Collection Time    06/04/13  5:53 PM      Result Value Range   Glucose-Capillary 147 (*) 70 - 99 mg/dL  GLUCOSE, CAPILLARY     Status: Abnormal   Collection Time    06/04/13  8:55 PM      Result Value Range   Glucose-Capillary 174 (*) 70 - 99 mg/dL  GLUCOSE, CAPILLARY     Status: Abnormal   Collection Time    06/05/13  7:48 AM      Result Value Range   Glucose-Capillary 209 (*) 70 - 99 mg/dL  GLUCOSE, CAPILLARY     Status: Abnormal   Collection Time    06/05/13 11:35 AM      Result Value Range   Glucose-Capillary 242 (*) 70 - 99 mg/dL  GLUCOSE, CAPILLARY     Status: Abnormal   Collection Time    06/05/13  5:11 PM      Result Value Range   Glucose-Capillary 330 (*)  70 - 99 mg/dL   Comment 1 Notify RN      Ir Fluoro Guide Cv Line Right  06/04/2013   *RADIOLOGY REPORT*  Clinical Data/Indication: RENAL FAILURE  IR RIGHT FLOURO GUIDE CV LINE,IR ULTRASOUND GUIDANCE VASC ACCESS RIGHT  Fluoroscopy Time: 18 seconds.  Procedure: The procedure, risks, benefits, and alternatives were explained to the patient. Questions regarding the procedure were encouraged and answered. The patient understands and consents to the procedure.  The right neck was prepped with betadine in a sterile fashion, and a sterile drape was applied covering the operative field. A sterile gown and sterile gloves were used for the procedure.  Under sonographic guidance,  a micropuncture needle was inserted into the right internal jugular vein and removed over a 0018 wire which was upsized to a three J E.  Sonographic and fluoroscopic documentation was obtained.  The dialysis catheter was then inserted over the wire to the cavoatrial junction, was flushed, was then sewn in place.  Findings: The tip of the dialysis catheter is at cavoatrial junction.  Complications: None.  IMPRESSION: Successful right internal jugular vein temporary dialysis catheter placement.   Original Report Authenticated By: Marybelle Killings, M.D.   Ir US Guide Vasc Access Right  06/04/2013   *RADIOLOGY REPORT*  Clinical Data/Indication: RENAL FAILURE  IR RIGHT FLOURO GUIDE CV LINE,IR ULTRASOUND GUIDANCE VASC ACCESS RIGHT  Fluoroscopy Time: 18 seconds.  Procedure: The procedure, risks, benefits, and alternatives were explained to the patient. Questions regarding the procedure were encouraged and answered. The patient understands and consents to the procedure.  The right neck was prepped with betadine in a sterile fashion, and a sterile drape was applied covering the operative field. A sterile gown and sterile gloves were used for the procedure.  Under sonographic guidance, a micropuncture needle was inserted into the right internal jugular vein and removed over a 0018 wire which was upsized to a three J E.  Sonographic and fluoroscopic documentation was obtained.  The dialysis catheter was then inserted over the wire to the cavoatrial junction, was flushed, was then sewn in place.  Findings: The tip of the dialysis catheter is at cavoatrial junction.  Complications: None.  IMPRESSION: Successful right internal jugular vein temporary dialysis catheter placement.   Original Report Authenticated By: Marybelle Killings, M.D.    Review of Systems  All other systems reviewed and are negative.   Blood pressure 116/65, pulse 61, temperature 99.1 F (37.3 C), temperature source Oral, resp. rate 19, height 5\' 2"  (1.575 m),  weight 116.699 kg (257 lb 4.4 oz), SpO2 98.00%. Physical Exam On examination patient's left foot does not have cellulitis she does not have a palpable pulse. Patient has calcified vessels which go out to her toes consistent with severe peripheral vascular disease. She has dry gangrene of the left foot second toe. Radiograph shows no evidence of osteomyelitis. Assessment/Plan: Assessment: Severe peripheral vascular disease with dry gangrene left foot second toe.  Plan: Do to the severe nature of her peripheral vascular disease I doubt patient would heal any type of the toe or ray amputation. We'll follow this conservatively and see if this will auto amputate. Discussed with the patient surgical intervention she is at risk of proceeding to a transtibial amputation if the wound does not heal. I will followup with her as an outpatient.  Valin Massie V 06/05/2013, 5:34 PM

## 2013-06-06 ENCOUNTER — Inpatient Hospital Stay (HOSPITAL_COMMUNITY): Payer: Medicare Other

## 2013-06-06 DIAGNOSIS — E1165 Type 2 diabetes mellitus with hyperglycemia: Secondary | ICD-10-CM

## 2013-06-06 DIAGNOSIS — I739 Peripheral vascular disease, unspecified: Secondary | ICD-10-CM

## 2013-06-06 DIAGNOSIS — D638 Anemia in other chronic diseases classified elsewhere: Secondary | ICD-10-CM

## 2013-06-06 DIAGNOSIS — N179 Acute kidney failure, unspecified: Secondary | ICD-10-CM

## 2013-06-06 DIAGNOSIS — E1129 Type 2 diabetes mellitus with other diabetic kidney complication: Secondary | ICD-10-CM

## 2013-06-06 DIAGNOSIS — N189 Chronic kidney disease, unspecified: Secondary | ICD-10-CM

## 2013-06-06 LAB — CBC
Platelets: 256 10*3/uL (ref 150–400)
RBC: 2.78 MIL/uL — ABNORMAL LOW (ref 3.87–5.11)
WBC: 11.1 10*3/uL — ABNORMAL HIGH (ref 4.0–10.5)

## 2013-06-06 LAB — GLUCOSE, CAPILLARY: Glucose-Capillary: 264 mg/dL — ABNORMAL HIGH (ref 70–99)

## 2013-06-06 LAB — BASIC METABOLIC PANEL
CO2: 21 mEq/L (ref 19–32)
Chloride: 102 mEq/L (ref 96–112)
GFR calc Af Amer: 11 mL/min — ABNORMAL LOW (ref >90)
Sodium: 136 mEq/L (ref 135–145)

## 2013-06-06 LAB — VANCOMYCIN, RANDOM: Vancomycin Rm: 14 ug/mL

## 2013-06-06 MED ORDER — FUROSEMIDE 40 MG PO TABS
40.0000 mg | ORAL_TABLET | Freq: Two times a day (BID) | ORAL | Status: DC
  Administered 2013-06-06 – 2013-06-10 (×8): 40 mg via ORAL
  Filled 2013-06-06 (×10): qty 1

## 2013-06-06 MED ORDER — NEPRO/CARBSTEADY PO LIQD
237.0000 mL | Freq: Two times a day (BID) | ORAL | Status: DC
  Administered 2013-06-06 – 2013-06-08 (×3): 237 mL via ORAL

## 2013-06-06 MED ORDER — VANCOMYCIN HCL IN DEXTROSE 1-5 GM/200ML-% IV SOLN
1000.0000 mg | Freq: Once | INTRAVENOUS | Status: AC
  Administered 2013-06-06: 1000 mg via INTRAVENOUS
  Filled 2013-06-06 (×2): qty 200

## 2013-06-06 NOTE — Progress Notes (Signed)
Subjective:  Pt looking more alert- 300 of urine recorded so far and has at least that much in her foley bag now... Family at bedside with questions.  Dr. Sharol Given saw pt- wants to follow conservatively see if autoamputates- he doubts that she would heal ray or toe amp, would need more extensive procedure  Objective Vital signs in last 24 hours: Filed Vitals:   06/05/13 1400 06/05/13 1800 06/05/13 2100 06/06/13 0500  BP: 116/65 152/72 151/78 148/55  Pulse: 61 70 62 60  Temp: 99.1 F (37.3 C) 98.7 F (37.1 C) 98.9 F (37.2 C) 97.9 F (36.6 C)  TempSrc: Oral Oral Oral Oral  Resp: 19 20 20 20   Height:      Weight:   116 kg (255 lb 11.7 oz)   SpO2: 98% 97% 97% 98%   Weight change: -2.5 kg (-5 lb 8.2 oz)  Intake/Output Summary (Last 24 hours) at 06/06/13 1110 Last data filed at 06/06/13 0700  Gross per 24 hour  Intake    290 ml  Output    301 ml  Net    -11 ml   Labs: Basic Metabolic Panel:  Recent Labs Lab 06/02/13 0620 06/03/13 0545 06/04/13 0600 06/06/13 0430  NA 133* 136 134* 136  K 4.7 5.0 5.1 4.3  CL 100 101 100 102  CO2 16* 18* 17* 21  GLUCOSE 132* 148* 202* 221*  BUN 77* 82* 90* 66*  CREATININE 6.50* 7.18* 7.23* 4.57*  CALCIUM 8.0* 8.6 8.8 8.4  PHOS  --  8.7* 9.9*  --    Liver Function Tests:  Recent Labs Lab 06/03/13 0545 06/04/13 0600  ALBUMIN 2.1* 2.1*   No results found for this basename: LIPASE, AMYLASE,  in the last 168 hours  Recent Labs Lab 06/01/13 1942  AMMONIA 23   CBC:  Recent Labs Lab 05/31/13 0525 06/01/13 0535 06/02/13 0620 06/04/13 0600 06/06/13 0430  WBC 11.8* 12.1* 10.2 10.9* 11.1*  HGB 6.5* 7.6* 7.4* 7.6* 7.7*  HCT 19.6* 23.2* 23.0* 23.3* 23.5*  MCV 84.8 84.1 86.1 85.3 84.5  PLT 176 194 184 241 256   Cardiac Enzymes: No results found for this basename: CKTOTAL, CKMB, CKMBINDEX, TROPONINI,  in the last 168 hours CBG:  Recent Labs Lab 06/05/13 0748 06/05/13 1135 06/05/13 1711 06/05/13 2152 06/06/13 0755  GLUCAP  209* 242* 330* 244* 224*    Iron Studies:  No results found for this basename: IRON, TIBC, TRANSFERRIN, FERRITIN,  in the last 72 hours Studies/Results: Ir Fluoro Guide Cv Line Right  06/04/2013   *RADIOLOGY REPORT*  Clinical Data/Indication: RENAL FAILURE  IR RIGHT FLOURO GUIDE CV LINE,IR ULTRASOUND GUIDANCE VASC ACCESS RIGHT  Fluoroscopy Time: 18 seconds.  Procedure: The procedure, risks, benefits, and alternatives were explained to the patient. Questions regarding the procedure were encouraged and answered. The patient understands and consents to the procedure.  The right neck was prepped with betadine in a sterile fashion, and a sterile drape was applied covering the operative field. A sterile gown and sterile gloves were used for the procedure.  Under sonographic guidance, a micropuncture needle was inserted into the right internal jugular vein and removed over a 0018 wire which was upsized to a three J E.  Sonographic and fluoroscopic documentation was obtained.  The dialysis catheter was then inserted over the wire to the cavoatrial junction, was flushed, was then sewn in place.  Findings: The tip of the dialysis catheter is at cavoatrial junction.  Complications: None.  IMPRESSION: Successful right internal  jugular vein temporary dialysis catheter placement.   Original Report Authenticated By: Marybelle Killings, M.D.   Ir US Guide Vasc Access Right  06/04/2013   *RADIOLOGY REPORT*  Clinical Data/Indication: RENAL FAILURE  IR RIGHT FLOURO GUIDE CV LINE,IR ULTRASOUND GUIDANCE VASC ACCESS RIGHT  Fluoroscopy Time: 18 seconds.  Procedure: The procedure, risks, benefits, and alternatives were explained to the patient. Questions regarding the procedure were encouraged and answered. The patient understands and consents to the procedure.  The right neck was prepped with betadine in a sterile fashion, and a sterile drape was applied covering the operative field. A sterile gown and sterile gloves were used for the  procedure.  Under sonographic guidance, a micropuncture needle was inserted into the right internal jugular vein and removed over a 0018 wire which was upsized to a three J E.  Sonographic and fluoroscopic documentation was obtained.  The dialysis catheter was then inserted over the wire to the cavoatrial junction, was flushed, was then sewn in place.  Findings: The tip of the dialysis catheter is at cavoatrial junction.  Complications: None.  IMPRESSION: Successful right internal jugular vein temporary dialysis catheter placement.   Original Report Authenticated By: Marybelle Killings, M.D.   Medications: Infusions:    Scheduled Medications: . alteplase  4 mg Intracatheter Once  . aspirin EC  81 mg Oral Daily  . atorvastatin  40 mg Oral q1800  . carvedilol  12.5 mg Oral BID WC  . darbepoetin (ARANESP) injection - NON-DIALYSIS  100 mcg Subcutaneous Q Wed-1800  . insulin aspart  0-15 Units Subcutaneous TID WC  . insulin aspart  0-5 Units Subcutaneous QHS  . omega-3 acid ethyl esters  2 g Oral Daily  . piperacillin-tazobactam (ZOSYN)  IV  2.25 g Intravenous Q8H  . vancomycin  1,000 mg Intravenous Once    have reviewed scheduled and prn medications.  Physical Exam: General: more animated today- best I've seen-  right IJ temp cath placed 7/17 Heart: RRR Lungs: poor effort, no wheezes Abdomen: obese, soft, non tender Extremities: some edema left second toe dry gangrene but maybe some extension into midfoot  Assessment/Plan: 62 year old BF with baseline CKD now presents with A on CRF in the setting of hypotension on an ARB and NSAIDS as well as sepsis from gangrenous toe  1.Renal- A on CRF (baseline creatinine is 2) in the setting of above. Appropriate management has taken place, ARB stopped and BP is better.  U/A consistent with ATN , culture negative.  UOP dropped and required one HD treatment on 7/17 for uremia- but since UOP better and pt more alert so now just watchful waiting.    Will not do any  HD today and watch labs and UOP closely.     I am still hopeful that this insult will eventually be reversible.  2. Hypertension/volume - pt certainly does not appear volume overloaded but is more in than out, will start low dose lasix 3. Anemia - has been an issue this hosp - she appears to have already required a transfusion. Have added aranesp and have repleted iron.  4. Toe gangrene- on zosyn and vanc- vanc trough high, could be part of etiology of AKI- doses being held- Dr. Gwenlyn Found has seen. Obviously does not want to do an arteriogram in the setting of AKI- he thinks this toe will autoamputate. Dr. Sharol Given involved as well, conservative management so far     Otha Monical A   06/06/2013,11:10 AM  LOS: 8 days

## 2013-06-06 NOTE — Progress Notes (Signed)
Patient ID: [name]  female  [id number]    DOB: Apr 08, 1951    DOA: 05/29/2013  PCP: Hayden Rasmussen., MD  Assessment/Plan:  AKI on CKD5  - No significant improvement, UA consistent with ATN, urine cultures showed no growth   - renal U/S did not show any acute hydronephrosis or obstruction - Temporary HD catheter placed by IR, started on first hemodialysis on 7/18.  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 Sharol Given for following up, recommended conservative management to see if autoamputates  H/o CVA   Lethargy- d/c'd neurontin- improving, possibly due to uremia   DVT Prophylaxis:  Code Status:  Disposition: Unclear if she is going to need permanent dialysis    Subjective: Seen earlier this morning, was waking up, no complaints today, no family member at the bedside at the time of my encounter  Objective: Weight change: -2.5 kg (-5 lb 8.2 oz)  Intake/Output Summary (Last 24 hours) at 06/06/13 1234 Last data filed at 06/06/13 0700  Gross per 24 hour  Intake    290 ml  Output    301 ml  Net    -11 ml   Blood pressure 168/61, pulse 56, temperature 97.7 F (36.5 C), temperature source Oral, resp. rate 18, height 5\' 2"  (1.575 m), weight 116 kg (255 lb 11.7 oz), SpO2 100.00%.  Physical Exam: General: Still sleepy at the time of my encounter, NAD. CVS: S1-S2 clear  Chest: CTAB no wheezing, rales or rhonchi Abdomen: Obese, soft NT, ND, NBS  Extremities: no cyanosis, clubbing. Left 2nd toe dry gangrene, foul smelling   Lab  Results: Basic Metabolic Panel:  Recent Labs Lab 06/04/13 0600 06/06/13 0430  NA 134* 136  K 5.1 4.3  CL 100 102  CO2 17* 21  GLUCOSE 202* 221*  BUN 90* 66*  CREATININE 7.23* 4.57*  CALCIUM 8.8 8.4  PHOS 9.9*  --    Liver Function Tests:  Recent Labs Lab 06/03/13 0545 06/04/13 0600  ALBUMIN 2.1* 2.1*   No results found for this basename: LIPASE, AMYLASE,  in the last 168 hours  Recent Labs Lab 06/01/13 1942  AMMONIA 23   CBC:  Recent Labs Lab 06/04/13 0600 06/06/13 0430  WBC 10.9* 11.1*  HGB 7.6* 7.7*  HCT 23.3* 23.5*  MCV 85.3 84.5  PLT 241 256   CBG:  Recent Labs Lab 06/05/13 1135 06/05/13 1711 06/05/13 2152 06/06/13 0755 06/06/13 1125  GLUCAP 242* 330* 244* 224* 264*     Micro Results: Recent Results (from the past 240 hour(s))  CULTURE, BLOOD (ROUTINE X 2)     Status: None   Collection Time    05/30/13  3:46 PM      Result Value Range Status   Specimen Description BLOOD RIGHT HAND   Final   Special Requests BOTTLES DRAWN AEROBIC ONLY 2CC   Final   Culture  Setup Time 05/30/2013 22:14   Final   Culture NO GROWTH 5 DAYS   Final   Report Status 06/05/2013 FINAL   Final  URINE CULTURE  Status: None   Collection Time    05/31/13  8:34 PM      Result Value Range Status   Specimen Description URINE, RANDOM   Final   Special Requests NONE   Final   Culture  Setup Time 06/01/2013 02:48   Final   Colony Count NO GROWTH   Final   Culture NO GROWTH   Final   Report Status 06/02/2013 FINAL   Final  CULTURE, BLOOD (ROUTINE X 2)     Status: None   Collection Time    06/01/13  7:30 AM      Result Value Range Status   Specimen Description BLOOD LEFT ARM   Final   Special Requests BOTTLES DRAWN AEROBIC ONLY 10CC   Final   Culture  Setup Time 06/01/2013 14:17   Final   Culture     Final   Value:        BLOOD CULTURE RECEIVED NO GROWTH TO DATE CULTURE WILL BE HELD FOR 5 DAYS BEFORE ISSUING A FINAL NEGATIVE REPORT   Report Status PENDING    Incomplete  CULTURE, BLOOD (ROUTINE X 2)     Status: None   Collection Time    06/01/13  7:35 AM      Result Value Range Status   Specimen Description BLOOD LEFT HAND   Final   Special Requests BOTTLES DRAWN AEROBIC ONLY 10CC   Final   Culture  Setup Time 06/01/2013 14:16   Final   Culture     Final   Value:        BLOOD CULTURE RECEIVED NO GROWTH TO DATE CULTURE WILL BE HELD FOR 5 DAYS BEFORE ISSUING A FINAL NEGATIVE REPORT   Report Status PENDING   Incomplete  URINE CULTURE     Status: None   Collection Time    06/03/13 12:15 PM      Result Value Range Status   Specimen Description URINE, CATHETERIZED   Final   Special Requests NONE   Final   Culture  Setup Time 06/03/2013 13:03   Final   Colony Count NO GROWTH   Final   Culture NO GROWTH   Final   Report Status 06/04/2013 FINAL   Final    Studies/Results: Ct Head Wo Contrast  05/30/2013   *RADIOLOGY REPORT*  Clinical Data: Left-sided weakness.  CT HEAD WITHOUT CONTRAST  Technique:  Contiguous axial images were obtained from the base of the skull through the vertex without contrast.  Comparison: 03/19/2013.  Findings: Large, old right cerebellar hemisphere infarct.  Old right occipital lobe infarct.  Patchy white matter low density in both cerebral hemispheres.  Minimally enlarged ventricles and subarachnoid spaces.  No intracranial hemorrhage, mass lesion or CT evidence of acute infarction.  Unremarkable bones and paranasal sinuses.  Stable left frontal probable venous lake.  IMPRESSION:  1.  No acute abnormality. 2.  Old infarcts, chronic small vessel white matter ischemic changes and minimal atrophy, as described above.   Original Report Authenticated By: Claudie Revering, M.D.   US Renal  06/01/2013   *RADIOLOGY REPORT*  Clinical Data: Acute renal failure chronic renal disease.  RENAL/URINARY TRACT ULTRASOUND COMPLETE  Comparison:  CT abdomen dated 11/27/2010  Findings: The study is limited by technique and body habitus.  Right Kidney:   11.6 cm in length.  No hydronephrosis.  Cortical echogenicity is within normal limits.  1.5 cm hypoechoic exophytic lesion from the lower pole of the right kidney is likely a cyst.  Left Kidney:  11.8 cm in length.  No hydronephrosis or mass.  Bladder:  Unremarkable.  IMPRESSION: Probable benign cyst in the lower pole of the right kidney.  No evidence of hydronephrosis.   Original Report Authenticated By: Marybelle Killings, M.D.   Ir Fluoro Guide Cv Line Right  06/04/2013   *RADIOLOGY REPORT*  Clinical Data/Indication: RENAL FAILURE  IR RIGHT FLOURO GUIDE CV LINE,IR ULTRASOUND GUIDANCE VASC ACCESS RIGHT  Fluoroscopy Time: 18 seconds.  Procedure: The procedure, risks, benefits, and alternatives were explained to the patient. Questions regarding the procedure were encouraged and answered. The patient understands and consents to the procedure.  The right neck was prepped with betadine in a sterile fashion, and a sterile drape was applied covering the operative field. A sterile gown and sterile gloves were used for the procedure.  Under sonographic guidance, a micropuncture needle was inserted into the right internal jugular vein and removed over a 0018 wire which was upsized to a three J E.  Sonographic and fluoroscopic documentation was obtained.  The dialysis catheter was then inserted over the wire to the cavoatrial junction, was flushed, was then sewn in place.  Findings: The tip of the dialysis catheter is at cavoatrial junction.  Complications: None.  IMPRESSION: Successful right internal jugular vein temporary dialysis catheter placement.   Original Report Authenticated By: Marybelle Killings, M.D.   Ir US Guide Vasc Access Right  06/04/2013   *RADIOLOGY REPORT*  Clinical Data/Indication: RENAL FAILURE  IR RIGHT FLOURO GUIDE CV LINE,IR ULTRASOUND GUIDANCE VASC ACCESS RIGHT  Fluoroscopy Time: 18 seconds.  Procedure: The procedure, risks, benefits, and alternatives were explained to the patient. Questions regarding the  procedure were encouraged and answered. The patient understands and consents to the procedure.  The right neck was prepped with betadine in a sterile fashion, and a sterile drape was applied covering the operative field. A sterile gown and sterile gloves were used for the procedure.  Under sonographic guidance, a micropuncture needle was inserted into the right internal jugular vein and removed over a 0018 wire which was upsized to a three J E.  Sonographic and fluoroscopic documentation was obtained.  The dialysis catheter was then inserted over the wire to the cavoatrial junction, was flushed, was then sewn in place.  Findings: The tip of the dialysis catheter is at cavoatrial junction.  Complications: None.  IMPRESSION: Successful right internal jugular vein temporary dialysis catheter placement.   Original Report Authenticated By: Marybelle Killings, M.D.   Dg Toe 2nd Left  05/30/2013   *RADIOLOGY REPORT*  Clinical Data: Wound infection of second digit left foot.  LEFT SECOND TOE  Comparison: None.  Findings: Diffuse vascular calcifications in the left foot.  No radiopaque foreign bodies or gas collections in the soft tissues. Bones appear intact.  No evidence of bone erosion or cortical irregularity to suggest osteomyelitis.  No acute fracture or subluxation.  Diffuse bone demineralization.  IMPRESSION: No acute bony abnormalities.  No evidence of osteomyelitis.   Original Report Authenticated By: Lucienne Capers, M.D.    Medications: Scheduled Meds: . alteplase  4 mg Intracatheter Once  . aspirin EC  81 mg Oral Daily  . atorvastatin  40 mg Oral q1800  . carvedilol  12.5 mg Oral BID WC  . darbepoetin (ARANESP) injection - NON-DIALYSIS  100 mcg Subcutaneous Q Wed-1800  . furosemide  40 mg Oral BID  . insulin aspart  0-15 Units Subcutaneous TID WC  . insulin aspart  0-5 Units Subcutaneous QHS  . omega-3 acid ethyl  esters  2 g Oral Daily  . piperacillin-tazobactam (ZOSYN)  IV  2.25 g Intravenous Q8H  .  vancomycin  1,000 mg Intravenous Once      LOS: 8 days   Zyrus Hetland M.D. [facility] 06/06/2013, 12:34 PM Pager: [phone]  If 7PM-7AM, please contact night-coverage [email or web address] Password [email or web address]

## 2013-06-06 NOTE — Progress Notes (Signed)
INITIAL NUTRITION ASSESSMENT  DOCUMENTATION CODES Per approved criteria  -Morbid Obesity   INTERVENTION: Nepro Shake po BID, each supplement provides 425 kcal and 19 grams protein.  NUTRITION DIAGNOSIS: Increased nutrient needs related to HD/foot ulcer as evidenced by estimated needs.   Goal: Pt to meet >/= 90% of their estimated nutrition needs  Monitor:  PO intake, supplement acceptance, weight trend, labs  Reason for Assessment: Pt identified as at nutrition risk on the Malnutrition Screen Tool  62 y.o. female  Admitting Dx: Diabetic ulcer of left foot  ASSESSMENT: HD # 8. Pt with AKI on CKD5. Pt started temporary HD 7/18, per MD note unclear if pt will remain on HD. Per notes pt for d/c to SNF. Pt with peripheral arterial disease with left 2nd toe gangrene. MD recommends conservative management at this time. Pt sleepy.    Height: Ht Readings from Last 1 Encounters:  06/04/13 5\' 2"  (1.575 m)    Weight: Wt Readings from Last 1 Encounters:  06/05/13 255 lb 11.7 oz (116 kg)    Ideal Body Weight: 50 kg   % Ideal Body Weight: 232%  Wt Readings from Last 10 Encounters:  06/05/13 255 lb 11.7 oz (116 kg)  05/06/13 236 lb 1.6 oz (107.094 kg)  04/28/13 235 lb 6.4 oz (106.777 kg)  03/22/13 252 lb 3.3 oz (114.4 kg)  10/24/11 243 lb 14.4 oz (110.632 kg)  10/23/10 215 lb (97.523 kg)  10/19/10 217 lb (98.431 kg)    Usual Body Weight: 230-250 lb   % Usual Body Weight: 100%  BMI:  Body mass index is 46.76 kg/(m^2).  Estimated Nutritional Needs: Kcal: 2000-2200 Protein: 120-130 grams Fluid: >1.2 L/day  Skin: left toe gangrene   Diet Order: Carb Control Meal Completion: 25%  EDUCATION NEEDS: -Education not appropriate at this time   Intake/Output Summary (Last 24 hours) at 06/06/13 1404 Last data filed at 06/06/13 0700  Gross per 24 hour  Intake    170 ml  Output      1 ml  Net    169 ml    Last BM: 7/16   Labs:   Recent Labs Lab 06/02/13 0620  06/03/13 0545 06/04/13 0600 06/06/13 0430  NA 133* 136 134* 136  K 4.7 5.0 5.1 4.3  CL 100 101 100 102  CO2 16* 18* 17* 21  BUN 77* 82* 90* 66*  CREATININE 6.50* 7.18* 7.23* 4.57*  CALCIUM 8.0* 8.6 8.8 8.4  PHOS  --  8.7* 9.9*  --   GLUCOSE 132* 148* 202* 221*    CBG (last 3)   Recent Labs  06/05/13 2152 06/06/13 0755 06/06/13 1125  GLUCAP 244* 224* 264*    Scheduled Meds: . alteplase  4 mg Intracatheter Once  . aspirin EC  81 mg Oral Daily  . atorvastatin  40 mg Oral q1800  . carvedilol  12.5 mg Oral BID WC  . darbepoetin (ARANESP) injection - NON-DIALYSIS  100 mcg Subcutaneous Q Wed-1800  . furosemide  40 mg Oral BID  . insulin aspart  0-15 Units Subcutaneous TID WC  . insulin aspart  0-5 Units Subcutaneous QHS  . omega-3 acid ethyl esters  2 g Oral Daily  . piperacillin-tazobactam (ZOSYN)  IV  2.25 g Intravenous Q8H    Continuous Infusions:   Past Medical History  Diagnosis Date  . ST elevation myocardial infarction (STEMI) of inferior wall  03/2009    Ostial/proximal RCA occlusion treated with BMS stent  . CAD (coronary artery disease), native  coronary artery      status post PCI to the RCA for inferior STEMI  . Cancer     endo ca  . Hypertension associated with diabetes   . Hyperlipidemia LDL goal <70   . Diabetes mellitus, type II, insulin dependent     With complications - CAD, CVA, peripheral ulcer ,  . Stroke, acute, within 8 weeks  4/30/ 2014    Right-sided weakness, mostly with balance issues and only mild weakness.  . Diabetic peripheral neuropathy associated with type 2 diabetes mellitus   . Obesity, Class III, BMI 40-49.9 (morbid obesity)     BMI 43; 5' 2',  235 pounds 6.4 ounces  . CKD (chronic kidney disease), stage III     Past Surgical History  Procedure Laterality Date  . Abdominal hysterectomy    . Leg tendon surgery      patient fell in a store right leg  . Leg surgery      hole in bone( during Childhood)  . Cardiac  catheterization  03/31/2009  . Coronary angioplasty  03/31/2009    PTCA , bare-metal stent 3.5 mm x 24 mm ostium of RCA ,EF 55%    Maylon Peppers RD, LDN, CNSC [phone] Pager [phone] After Hours Pager

## 2013-06-06 NOTE — Progress Notes (Signed)
ANTIBIOTIC CONSULT NOTE - FOLLOW UP  Pharmacy Consult for Vancomycin Indication: toe necrosis  No Known Allergies  Patient Measurements: Height: 5\' 2"  (157.5 cm) Weight: 255 lb 11.7 oz (116 kg) IBW/kg (Calculated) : 50.1 Adjusted Body Weight:   Vital Signs: Temp: 97.9 F (36.6 C) (07/19 0500) Temp src: Oral (07/19 0500) BP: 148/55 mmHg (07/19 0500) Pulse Rate: 60 (07/19 0500) Intake/Output from previous day: 07/18 0701 - 07/19 0700 In: 290 [P.O.:240; IV Piggyback:50] Out: 301 [Urine:300; Stool:1] Intake/Output from this shift:    Labs:  Recent Labs  06/04/13 0600 06/06/13 0430  WBC 10.9* 11.1*  HGB 7.6* 7.7*  PLT 241 256  CREATININE 7.23* 4.57*   Estimated Creatinine Clearance: 15.6 ml/min (by C-G formula based on Cr of 4.57).  Recent Labs  06/04/13 1500 06/06/13 0430  VANCORANDOM 21.3 14.0     Microbiology: Recent Results (from the past 720 hour(s))  CULTURE, BLOOD (ROUTINE X 2)     Status: None   Collection Time    05/30/13  3:46 PM      Result Value Range Status   Specimen Description BLOOD RIGHT HAND   Final   Special Requests BOTTLES DRAWN AEROBIC ONLY 2CC   Final   Culture  Setup Time 05/30/2013 22:14   Final   Culture NO GROWTH 5 DAYS   Final   Report Status 06/05/2013 FINAL   Final  URINE CULTURE     Status: None   Collection Time    05/31/13  8:34 PM      Result Value Range Status   Specimen Description URINE, RANDOM   Final   Special Requests NONE   Final   Culture  Setup Time 06/01/2013 02:48   Final   Colony Count NO GROWTH   Final   Culture NO GROWTH   Final   Report Status 06/02/2013 FINAL   Final  CULTURE, BLOOD (ROUTINE X 2)     Status: None   Collection Time    06/01/13  7:30 AM      Result Value Range Status   Specimen Description BLOOD LEFT ARM   Final   Special Requests BOTTLES DRAWN AEROBIC ONLY 10CC   Final   Culture  Setup Time 06/01/2013 14:17   Final   Culture     Final   Value:        BLOOD CULTURE RECEIVED NO GROWTH  TO DATE CULTURE WILL BE HELD FOR 5 DAYS BEFORE ISSUING A FINAL NEGATIVE REPORT   Report Status PENDING   Incomplete  CULTURE, BLOOD (ROUTINE X 2)     Status: None   Collection Time    06/01/13  7:35 AM      Result Value Range Status   Specimen Description BLOOD LEFT HAND   Final   Special Requests BOTTLES DRAWN AEROBIC ONLY 10CC   Final   Culture  Setup Time 06/01/2013 14:16   Final   Culture     Final   Value:        BLOOD CULTURE RECEIVED NO GROWTH TO DATE CULTURE WILL BE HELD FOR 5 DAYS BEFORE ISSUING A FINAL NEGATIVE REPORT   Report Status PENDING   Incomplete  URINE CULTURE     Status: None   Collection Time    06/03/13 12:15 PM      Result Value Range Status   Specimen Description URINE, CATHETERIZED   Final   Special Requests NONE   Final   Culture  Setup Time 06/03/2013 13:03  Final   Colony Count NO GROWTH   Final   Culture NO GROWTH   Final   Report Status 06/04/2013 FINAL   Final    Anti-infectives   Start     Dose/Rate Route Frequency Ordered Stop   06/01/13 2200  piperacillin-tazobactam (ZOSYN) IVPB 2.25 g     2.25 g 100 mL/hr over 30 Minutes Intravenous 3 times per day 06/01/13 1402     06/01/13 0000  vancomycin (VANCOCIN) 1,500 mg in sodium chloride 0.9 % 500 mL IVPB  Status:  Discontinued     1,500 mg 250 mL/hr over 120 Minutes Intravenous Every 48 hours 05/30/13 0054 06/02/13 1132   05/30/13 0600  piperacillin-tazobactam (ZOSYN) IVPB 3.375 g  Status:  Discontinued     3.375 g 12.5 mL/hr over 240 Minutes Intravenous 3 times per day 05/30/13 0249 06/01/13 1402   05/30/13 0100  vancomycin (VANCOCIN) 2,000 mg in sodium chloride 0.9 % 500 mL IVPB     2,000 mg 250 mL/hr over 120 Minutes Intravenous  Once 05/30/13 0029 05/30/13 0331   05/30/13 0015  piperacillin-tazobactam (ZOSYN) IVPB 3.375 g     3.375 g 12.5 mL/hr over 240 Minutes Intravenous  Once 05/30/13 0004 05/30/13 0101      Assessment: 62yo female with toe necrosis, dry gangrene.  Vancomycin has been  held due to AKI and acute rise in Cr, now with great drop to 4.57 today.  She has received HD x 1 with no plans for further HD at this time.  Today is day #8 of Vancomycin.  Blood cultures remain NTD.  Goal of Therapy:  Vancomycin trough level 10-15 mcg/ml  Plan:  1.  Vancomycin 1000mg  IV x 1 2.  F/U renal function and redose as appropriate  Gracy Bruins, PharmD Clinical Pharmacist [facility]

## 2013-06-06 NOTE — Progress Notes (Signed)
Orthopedic Tech Progress Note Patient Details:  [name] 1951/11/09 [id number] Post op shoe ordered and delivered for patient use Ortho Devices Type of Ortho Device: Postop shoe/boot Ortho Device/Splint Location: Left Ortho Device/Splint Interventions: Ordered   Asia R Thompson 06/06/2013, 12:51 PM

## 2013-06-07 LAB — BASIC METABOLIC PANEL
BUN: 73 mg/dL — ABNORMAL HIGH (ref 6–23)
CO2: 21 mEq/L (ref 19–32)
Chloride: 102 mEq/L (ref 96–112)
Creatinine, Ser: 4.34 mg/dL — ABNORMAL HIGH (ref 0.50–1.10)
GFR calc Af Amer: 12 mL/min — ABNORMAL LOW (ref >90)
Potassium: 4.5 mEq/L (ref 3.5–5.1)

## 2013-06-07 LAB — CBC
HCT: 23.1 % — ABNORMAL LOW (ref 36.0–46.0)
Hemoglobin: 7.4 g/dL — ABNORMAL LOW (ref 12.0–15.0)
MCV: 85.6 fL (ref 78.0–100.0)
RBC: 2.7 MIL/uL — ABNORMAL LOW (ref 3.87–5.11)
RDW: 16.4 % — ABNORMAL HIGH (ref 11.5–15.5)
WBC: 9.6 10*3/uL (ref 4.0–10.5)

## 2013-06-07 LAB — CULTURE, BLOOD (ROUTINE X 2)
Culture: NO GROWTH
Culture: NO GROWTH

## 2013-06-07 LAB — GLUCOSE, CAPILLARY
Glucose-Capillary: 227 mg/dL — ABNORMAL HIGH (ref 70–99)
Glucose-Capillary: 315 mg/dL — ABNORMAL HIGH (ref 70–99)
Glucose-Capillary: 229 mg/dL — ABNORMAL HIGH (ref 70–99)

## 2013-06-07 MED ORDER — INSULIN ASPART 100 UNIT/ML ~~LOC~~ SOLN
3.0000 [IU] | Freq: Three times a day (TID) | SUBCUTANEOUS | Status: DC
  Administered 2013-06-07 – 2013-06-10 (×9): 3 [IU] via SUBCUTANEOUS

## 2013-06-07 MED ORDER — INSULIN GLARGINE 100 UNIT/ML ~~LOC~~ SOLN
10.0000 [IU] | Freq: Every day | SUBCUTANEOUS | Status: DC
  Administered 2013-06-07: 10 [IU] via SUBCUTANEOUS
  Filled 2013-06-07 (×2): qty 0.1

## 2013-06-07 NOTE — Progress Notes (Signed)
Clinical Social Work Department BRIEF PSYCHOSOCIAL ASSESSMENT 06/07/2013  Patient:  [name], [name]     Account Number:  [id number]     Admit date:  05/29/2013  Clinical Social Worker:  Ulyess Blossom  Date/Time:  06/07/2013 02:00 PM  Referred by:  Physician  Date Referred:  06/06/2013 Referred for  SNF Placement   Other Referral:   Interview type:  Patient Other interview type:   Database review.    PSYCHOSOCIAL DATA Living Status:  FAMILY Admitted from facility:   Level of care:   Primary support name:  shavaonne Primary support relationship to patient:  CHILD, ADULT Degree of support available:   pt reports having 24 hour care provided to her by daughter and niece    CURRENT CONCERNS Current Concerns  Post-Acute Placement   Other Concerns:    SOCIAL WORK ASSESSMENT / PLAN CSW spoke with pt re: role of CSW/dcp.  Pt wants to return home at d/c and doesn't want SNF.  If she absolutely needs NH, she will consider, but she is thinking she can return home.  CSW also spoke with her daughter, who is also non-commital in making a definiite decision re: pt's d/c plan.  CSW also provided pt with AD paperwork.  Unit CSW to follow   Assessment/plan status:  Psychosocial Support/Ongoing Assessment of Needs Other assessment/ plan:   Information/referral to community resources:    PATIENT'S/FAMILY'S RESPONSE TO PLAN OF CARE: Pt/dtr want to see "how things go" before making a final decision re: d/c plan.  Pt preoccupied re: her phone converation with dtr during time of CSW visit.

## 2013-06-07 NOTE — Progress Notes (Signed)
Subjective:  Pt looking more alert yet-  Made 1600 of urine with assist of lasix. BUN up slightly but creatinine down from yesterday. Has a good appetite    Objective Vital signs in last 24 hours: Filed Vitals:   06/06/13 2141 06/06/13 2210 06/06/13 2340 06/07/13 0613  BP: 129/67  149/73 148/65  Pulse: 62  63 60  Temp: 98.6 F (37 C)  98 F (36.7 C) 98.6 F (37 C)  TempSrc: Oral  Oral Oral  Resp: 18  18 18   Height:      Weight:  117.572 kg (259 lb 3.2 oz)    SpO2: 98%  100% 99%   Weight change: 1.572 kg (3 lb 7.5 oz)  Intake/Output Summary (Last 24 hours) at 06/07/13 1012 Last data filed at 06/07/13 0700  Gross per 24 hour  Intake    680 ml  Output   1600 ml  Net   -920 ml   Labs: Basic Metabolic Panel:  Recent Labs Lab 06/02/13 0620 06/03/13 0545 06/04/13 0600 06/06/13 0430 06/07/13 0550  NA 133* 136 134* 136 136  K 4.7 5.0 5.1 4.3 4.5  CL 100 101 100 102 102  CO2 16* 18* 17* 21 21  GLUCOSE 132* 148* 202* 221* 249*  BUN 77* 82* 90* 66* 73*  CREATININE 6.50* 7.18* 7.23* 4.57* 4.34*  CALCIUM 8.0* 8.6 8.8 8.4 8.8  PHOS  --  8.7* 9.9*  --   --    Liver Function Tests:  Recent Labs Lab 06/03/13 0545 06/04/13 0600  ALBUMIN 2.1* 2.1*   No results found for this basename: LIPASE, AMYLASE,  in the last 168 hours  Recent Labs Lab 06/01/13 1942  AMMONIA 23   CBC:  Recent Labs Lab 06/01/13 0535 06/02/13 0620 06/04/13 0600 06/06/13 0430 06/07/13 0550  WBC 12.1* 10.2 10.9* 11.1* 9.6  HGB 7.6* 7.4* 7.6* 7.7* 7.4*  HCT 23.2* 23.0* 23.3* 23.5* 23.1*  MCV 84.1 86.1 85.3 84.5 85.6  PLT 194 184 241 256 263   Cardiac Enzymes: No results found for this basename: CKTOTAL, CKMB, CKMBINDEX, TROPONINI,  in the last 168 hours CBG:  Recent Labs Lab 06/06/13 0755 06/06/13 1125 06/06/13 1654 06/06/13 2147 06/07/13 0752  GLUCAP 224* 264* 346* 338* 227*    Iron Studies:  No results found for this basename: IRON, TIBC, TRANSFERRIN, FERRITIN,  in the last 72  hours Studies/Results: Dg Foot 2 Views Left  06/06/2013   *RADIOLOGY REPORT*  Clinical Data: Infection, left second toe.  LEFT FOOT - 2 VIEW  Comparison: 05/30/2013  Findings: There is soft tissue loss along the mid aspect of the left second toe similar to the prior exam.  There is no convincing osteomyelitis.  There is no fracture or dislocation.  The bones are demineralized.  There is a large plantar calcaneal spur.  The soft tissues show fairly extensive vascular calcifications.  IMPRESSION: No radiographic evidence of osteomyelitis.  No fracture or acute bony abnormality.   Original Report Authenticated By: Lajean Manes, M.D.   Medications: Infusions:    Scheduled Medications: . alteplase  4 mg Intracatheter Once  . aspirin EC  81 mg Oral Daily  . atorvastatin  40 mg Oral q1800  . carvedilol  12.5 mg Oral BID WC  . darbepoetin (ARANESP) injection - NON-DIALYSIS  100 mcg Subcutaneous Q Wed-1800  . feeding supplement (NEPRO CARB STEADY)  237 mL Oral BID BM  . furosemide  40 mg Oral BID  . insulin aspart  0-15 Units Subcutaneous  TID WC  . insulin aspart  0-5 Units Subcutaneous QHS  . omega-3 acid ethyl esters  2 g Oral Daily  . piperacillin-tazobactam (ZOSYN)  IV  2.25 g Intravenous Q8H    have reviewed scheduled and prn medications.  Physical Exam: General: more animated today- best I've seen-  right IJ temp cath placed 7/17 Heart: RRR Lungs: poor effort, no wheezes Abdomen: obese, soft, non tender Extremities: some edema left second toe dry gangrene but maybe some extension into midfoot  Assessment/Plan: 62 year old BF with baseline CKD now presents with A on CRF in the setting of hypotension on an ARB and NSAIDS as well as sepsis from gangrenous toe  1.Renal- A on CRF (baseline creatinine is 2) in the setting of above. Appropriate management has taken place, ARB stopped and BP is better.  U/A consistent with ATN , culture negative.  UOP dropped and required one HD treatment on  7/17 for uremia- but since UOP better and pt more alert so now just watchful waiting.    Will not do any HD today and watch labs and UOP closely.     I am still hopeful that this insult will eventually be reversible.  Will need clarification of renal status before could be discharged to SNF  2. Hypertension/volume - volume status difficult to tell but has been more in than out this hosp, cont relatively low dose lasix 3. Anemia - has been an issue this hosp - she appears to have already required a transfusion. Have added aranesp and have repleted iron.  4. Toe gangrene- on zosyn and vanc- vanc trough high, could be part of etiology of AKI- doses being held- Dr. Gwenlyn Found has seen. Obviously does not want to do an arteriogram in the setting of AKI- he thinks this toe will autoamputate. Dr. Sharol Given involved as well, conservative management so far     Alexsandro Salek A   06/07/2013,10:12 AM  LOS: 9 days

## 2013-06-07 NOTE — Progress Notes (Signed)
Patient ID: [name]  female  [id number]    DOB: October 22, 1951    DOA: 05/29/2013  PCP: Hayden Rasmussen., MD  Assessment/Plan:  AKI on CKD5  - No significant improvement, UA consistent with ATN, urine cultures showed no growth   - renal U/S did not show any acute hydronephrosis or obstruction - Temporary HD catheter placed by IR, started on first hemodialysis on 7/18. 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 Sharol Given for following up, recommended conservative management to see if autoamputates  H/o CVA   Lethargy- d/c'd neurontin- improving, possibly due to uremia . Improved significantly, patient alert and awake and oriented  DVT Prophylaxis:  Code Status:  Disposition: Unclear if she is going to need permanent dialysis    Subjective: Seen earlier this morning, alert and very cheerful today appropriately conversing  Objective: Weight change: 1.572 kg (3 lb 7.5 oz)  Intake/Output Summary (Last 24 hours) at 06/07/13 1229 Last data filed at 06/07/13 0815  Gross per 24 hour  Intake   1040 ml  Output   1600 ml  Net   -560 ml   Blood pressure 121/56, pulse 68, temperature 98.7 F (37.1 C), temperature source Oral, resp. rate 17, height 5\' 2"  (1.575 m), weight 117.572 kg (259 lb 3.2 oz), SpO2 98.00%.  Physical Exam: General: Alert and oriented  CVS: S1-S2 clear  Chest: CTAB no wheezing, rales or rhonchi Abdomen: Obese, soft NT, ND, NBS  Extremities: no cyanosis, clubbing. Left 2nd toe  dry gangrene, foul smelling. Small blood blister above the heel   Lab Results: Basic Metabolic Panel:  Recent Labs Lab 06/04/13 0600 06/06/13 0430 06/07/13 0550  NA 134* 136 136  K 5.1 4.3 4.5  CL 100 102 102  CO2 17* 21 21  GLUCOSE 202* 221* 249*  BUN 90* 66* 73*  CREATININE 7.23* 4.57* 4.34*  CALCIUM 8.8 8.4 8.8  PHOS 9.9*  --   --    Liver Function Tests:  Recent Labs Lab 06/03/13 0545 06/04/13 0600  ALBUMIN 2.1* 2.1*   No results found for this basename: LIPASE, AMYLASE,  in the last 168 hours  Recent Labs Lab 06/01/13 1942  AMMONIA 23   CBC:  Recent Labs Lab 06/06/13 0430 06/07/13 0550  WBC 11.1* 9.6  HGB 7.7* 7.4*  HCT 23.5* 23.1*  MCV 84.5 85.6  PLT 256 263   CBG:  Recent Labs Lab 06/06/13 0755 06/06/13 1125 06/06/13 1654 06/06/13 2147 06/07/13 0752  GLUCAP 224* 264* 346* 338* 227*     Micro Results: Recent Results (from the past 240 hour(s))  CULTURE, BLOOD (ROUTINE X 2)     Status: None   Collection Time    05/30/13  3:46 PM      Result Value Range Status   Specimen Description BLOOD RIGHT HAND   Final   Special Requests BOTTLES DRAWN AEROBIC ONLY 2CC   Final   Culture  Setup Time 05/30/2013 22:14   Final   Culture NO GROWTH 5 DAYS   Final  Report Status 06/05/2013 FINAL   Final  URINE CULTURE     Status: None   Collection Time    05/31/13  8:34 PM      Result Value Range Status   Specimen Description URINE, RANDOM   Final   Special Requests NONE   Final   Culture  Setup Time 06/01/2013 02:48   Final   Colony Count NO GROWTH   Final   Culture NO GROWTH   Final   Report Status 06/02/2013 FINAL   Final  CULTURE, BLOOD (ROUTINE X 2)     Status: None   Collection Time    06/01/13  7:30 AM      Result Value Range Status   Specimen Description BLOOD LEFT ARM   Final   Special Requests BOTTLES DRAWN AEROBIC ONLY 10CC   Final   Culture  Setup Time 06/01/2013 14:17   Final   Culture     Final   Value:        BLOOD CULTURE  RECEIVED NO GROWTH TO DATE CULTURE WILL BE HELD FOR 5 DAYS BEFORE ISSUING A FINAL NEGATIVE REPORT   Report Status PENDING   Incomplete  CULTURE, BLOOD (ROUTINE X 2)     Status: None   Collection Time    06/01/13  7:35 AM      Result Value Range Status   Specimen Description BLOOD LEFT HAND   Final   Special Requests BOTTLES DRAWN AEROBIC ONLY 10CC   Final   Culture  Setup Time 06/01/2013 14:16   Final   Culture     Final   Value:        BLOOD CULTURE RECEIVED NO GROWTH TO DATE CULTURE WILL BE HELD FOR 5 DAYS BEFORE ISSUING A FINAL NEGATIVE REPORT   Report Status PENDING   Incomplete  URINE CULTURE     Status: None   Collection Time    06/03/13 12:15 PM      Result Value Range Status   Specimen Description URINE, CATHETERIZED   Final   Special Requests NONE   Final   Culture  Setup Time 06/03/2013 13:03   Final   Colony Count NO GROWTH   Final   Culture NO GROWTH   Final   Report Status 06/04/2013 FINAL   Final    Studies/Results: Ct Head Wo Contrast  05/30/2013   *RADIOLOGY REPORT*  Clinical Data: Left-sided weakness.  CT HEAD WITHOUT CONTRAST  Technique:  Contiguous axial images were obtained from the base of the skull through the vertex without contrast.  Comparison: 03/19/2013.  Findings: Large, old right cerebellar hemisphere infarct.  Old right occipital lobe infarct.  Patchy white matter low density in both cerebral hemispheres.  Minimally enlarged ventricles and subarachnoid spaces.  No intracranial hemorrhage, mass lesion or CT evidence of acute infarction.  Unremarkable bones and paranasal sinuses.  Stable left frontal probable venous lake.  IMPRESSION:  1.  No acute abnormality. 2.  Old infarcts, chronic small vessel white matter ischemic changes and minimal atrophy, as described above.   Original Report Authenticated By: Claudie Revering, M.D.   US Renal  06/01/2013   *RADIOLOGY REPORT*  Clinical Data: Acute renal failure chronic renal disease.  RENAL/URINARY TRACT ULTRASOUND  COMPLETE  Comparison:  CT abdomen dated 11/27/2010  Findings: The study is limited by technique and body habitus.  Right Kidney:  11.6 cm in length.  No hydronephrosis.  Cortical echogenicity is within normal limits.  1.5 cm hypoechoic exophytic lesion from the  lower pole of the right kidney is likely a cyst.  Left Kidney:  11.8 cm in length.  No hydronephrosis or mass.  Bladder:  Unremarkable.  IMPRESSION: Probable benign cyst in the lower pole of the right kidney.  No evidence of hydronephrosis.   Original Report Authenticated By: Marybelle Killings, M.D.   Ir Fluoro Guide Cv Line Right  06/04/2013   *RADIOLOGY REPORT*  Clinical Data/Indication: RENAL FAILURE  IR RIGHT FLOURO GUIDE CV LINE,IR ULTRASOUND GUIDANCE VASC ACCESS RIGHT  Fluoroscopy Time: 18 seconds.  Procedure: The procedure, risks, benefits, and alternatives were explained to the patient. Questions regarding the procedure were encouraged and answered. The patient understands and consents to the procedure.  The right neck was prepped with betadine in a sterile fashion, and a sterile drape was applied covering the operative field. A sterile gown and sterile gloves were used for the procedure.  Under sonographic guidance, a micropuncture needle was inserted into the right internal jugular vein and removed over a 0018 wire which was upsized to a three J E.  Sonographic and fluoroscopic documentation was obtained.  The dialysis catheter was then inserted over the wire to the cavoatrial junction, was flushed, was then sewn in place.  Findings: The tip of the dialysis catheter is at cavoatrial junction.  Complications: None.  IMPRESSION: Successful right internal jugular vein temporary dialysis catheter placement.   Original Report Authenticated By: Marybelle Killings, M.D.   Ir US Guide Vasc Access Right  06/04/2013   *RADIOLOGY REPORT*  Clinical Data/Indication: RENAL FAILURE  IR RIGHT FLOURO GUIDE CV LINE,IR ULTRASOUND GUIDANCE VASC ACCESS RIGHT  Fluoroscopy Time:  18 seconds.  Procedure: The procedure, risks, benefits, and alternatives were explained to the patient. Questions regarding the procedure were encouraged and answered. The patient understands and consents to the procedure.  The right neck was prepped with betadine in a sterile fashion, and a sterile drape was applied covering the operative field. A sterile gown and sterile gloves were used for the procedure.  Under sonographic guidance, a micropuncture needle was inserted into the right internal jugular vein and removed over a 0018 wire which was upsized to a three J E.  Sonographic and fluoroscopic documentation was obtained.  The dialysis catheter was then inserted over the wire to the cavoatrial junction, was flushed, was then sewn in place.  Findings: The tip of the dialysis catheter is at cavoatrial junction.  Complications: None.  IMPRESSION: Successful right internal jugular vein temporary dialysis catheter placement.   Original Report Authenticated By: Marybelle Killings, M.D.   Dg Toe 2nd Left  05/30/2013   *RADIOLOGY REPORT*  Clinical Data: Wound infection of second digit left foot.  LEFT SECOND TOE  Comparison: None.  Findings: Diffuse vascular calcifications in the left foot.  No radiopaque foreign bodies or gas collections in the soft tissues. Bones appear intact.  No evidence of bone erosion or cortical irregularity to suggest osteomyelitis.  No acute fracture or subluxation.  Diffuse bone demineralization.  IMPRESSION: No acute bony abnormalities.  No evidence of osteomyelitis.   Original Report Authenticated By: Lucienne Capers, M.D.    Medications: Scheduled Meds: . alteplase  4 mg Intracatheter Once  . aspirin EC  81 mg Oral Daily  . atorvastatin  40 mg Oral q1800  . carvedilol  12.5 mg Oral BID WC  . darbepoetin (ARANESP) injection - NON-DIALYSIS  100 mcg Subcutaneous Q Wed-1800  . feeding supplement (NEPRO CARB STEADY)  237 mL Oral BID BM  . furosemide  40  mg Oral BID  . insulin aspart   0-15 Units Subcutaneous TID WC  . insulin aspart  0-5 Units Subcutaneous QHS  . omega-3 acid ethyl esters  2 g Oral Daily  . piperacillin-tazobactam (ZOSYN)  IV  2.25 g Intravenous Q8H      LOS: 9 days   Ariann Khaimov M.D. [facility] 06/07/2013, 12:29 PM Pager: [phone]  If 7PM-7AM, please contact night-coverage [email or web address] Password [email or web address]

## 2013-06-08 LAB — BASIC METABOLIC PANEL
Chloride: 99 mEq/L (ref 96–112)
GFR calc Af Amer: 14 mL/min — ABNORMAL LOW (ref >90)
GFR calc non Af Amer: 12 mL/min — ABNORMAL LOW (ref >90)
Potassium: 3.9 mEq/L (ref 3.5–5.1)
Sodium: 134 mEq/L — ABNORMAL LOW (ref 135–145)

## 2013-06-08 LAB — CBC
HCT: 23.4 % — ABNORMAL LOW (ref 36.0–46.0)
Hemoglobin: 7.5 g/dL — ABNORMAL LOW (ref 12.0–15.0)
MCHC: 32.1 g/dL (ref 30.0–36.0)
RDW: 16.2 % — ABNORMAL HIGH (ref 11.5–15.5)
WBC: 9.6 10*3/uL (ref 4.0–10.5)

## 2013-06-08 LAB — GLUCOSE, CAPILLARY: Glucose-Capillary: 266 mg/dL — ABNORMAL HIGH (ref 70–99)

## 2013-06-08 MED ORDER — VANCOMYCIN HCL IN DEXTROSE 1-5 GM/200ML-% IV SOLN
1000.0000 mg | Freq: Once | INTRAVENOUS | Status: AC
  Administered 2013-06-08: 1000 mg via INTRAVENOUS
  Filled 2013-06-08: qty 200

## 2013-06-08 MED ORDER — INSULIN GLARGINE 100 UNIT/ML ~~LOC~~ SOLN
15.0000 [IU] | Freq: Every day | SUBCUTANEOUS | Status: DC
  Administered 2013-06-08: 15 [IU] via SUBCUTANEOUS
  Filled 2013-06-08: qty 0.15

## 2013-06-08 NOTE — Progress Notes (Signed)
S: No new CO.  Asking to go home O:BP 145/40  Pulse 65  Temp(Src) 98.4 F (36.9 C) (Oral)  Resp 18  Ht 5\' 2"  (1.575 m)  Wt 118 kg (260 lb 2.3 oz)  BMI 47.57 kg/m2  SpO2 93%  Intake/Output Summary (Last 24 hours) at 06/08/13 1005 Last data filed at 06/08/13 0604  Gross per 24 hour  Intake    220 ml  Output   1950 ml  Net  -1730 ml   Weight change: 0.428 kg (15.1 oz) [id number] and alert CVS:RRR Resp:clear Abd:+ BS NTND Ext: Bandage on Lt 2nd toe NEURO: CNI Ox3 no asterixis Rt IJ temp HD cath  . alteplase  4 mg Intracatheter Once  . aspirin EC  81 mg Oral Daily  . atorvastatin  40 mg Oral q1800  . carvedilol  12.5 mg Oral BID WC  . darbepoetin (ARANESP) injection - NON-DIALYSIS  100 mcg Subcutaneous Q Wed-1800  . feeding supplement (NEPRO CARB STEADY)  237 mL Oral BID BM  . furosemide  40 mg Oral BID  . insulin aspart  0-15 Units Subcutaneous TID WC  . insulin aspart  0-5 Units Subcutaneous QHS  . insulin aspart  3 Units Subcutaneous TID WC  . insulin glargine  10 Units Subcutaneous QHS  . omega-3 acid ethyl esters  2 g Oral Daily  . piperacillin-tazobactam (ZOSYN)  IV  2.25 g Intravenous Q8H   Dg Foot 2 Views Left  06/06/2013   *RADIOLOGY REPORT*  Clinical Data: Infection, left second toe.  LEFT FOOT - 2 VIEW  Comparison: 05/30/2013  Findings: There is soft tissue loss along the mid aspect of the left second toe similar to the prior exam.  There is no convincing osteomyelitis.  There is no fracture or dislocation.  The bones are demineralized.  There is a large plantar calcaneal spur.  The soft tissues show fairly extensive vascular calcifications.  IMPRESSION: No radiographic evidence of osteomyelitis.  No fracture or acute bony abnormality.   Original Report Authenticated By: Lajean Manes, M.D.   BMET    Component Value Date/Time   NA 134* 06/08/2013 0746   K 3.9 06/08/2013 0746   CL 99 06/08/2013 0746   CO2 21 06/08/2013 0746   GLUCOSE 260* 06/08/2013 0746   BUN 70*  06/08/2013 0746   CREATININE 3.65* 06/08/2013 0746   CALCIUM 9.1 06/08/2013 0746   GFRNONAA 12* 06/08/2013 0746   GFRAA 14* 06/08/2013 0746   CBC    Component Value Date/Time   WBC 9.6 06/08/2013 0746   RBC 2.72* 06/08/2013 0746   HGB 7.5* 06/08/2013 0746   HCT 23.4* 06/08/2013 0746   PLT 279 06/08/2013 0746   MCV 86.0 06/08/2013 0746   MCH 27.6 06/08/2013 0746   MCHC 32.1 06/08/2013 0746   RDW 16.2* 06/08/2013 0746   LYMPHSABS 2.0 05/29/2013 2116   MONOABS 1.3* 05/29/2013 2116   EOSABS 0.1 05/29/2013 2116   BASOSABS 0.0 05/29/2013 2116     Assessment: 1. Acute on CKD, UO good and Scr trending down 2. Gangrene L 2nd toe 3. Anemia Plan: 1.  Daily SCr 2. Cont AB 3. If SCr trends down tomorrow, then DC HD cath 4.  Avoid ACE/ARB at least until the issue with her toe is resolved 5. Change to renal diet   Natsuko Kelsay T

## 2013-06-08 NOTE — Progress Notes (Signed)
Physical Therapy Treatment Patient Details Name: [name] MRN: [id number] DOB: 05/20/1951 Today's Date: 06/08/2013 Time: [id number] PT Time Calculation (min): 31 min  PT Assessment / Plan / Recommendation  History of Present Illness 62 yo female h/o dm, pvd, cad comes in with fighting left toe infection for weeks; Relatively recent CVA affecting right side   Clinical Impression Pt able to ambulate total of 30 feet with min assist and 2 seated rest breaks today due to weakness and fatigue.  Continue to recommend ST-SNF prior to home however pt would like to d/c home.   PT Comments     Follow Up Recommendations  SNF;Supervision/Assistance - 24 hour     Does the patient have the potential to tolerate intense rehabilitation     Barriers to Discharge        Equipment Recommendations  Wheelchair (measurements PT);Wheelchair cushion (measurements PT);Hospital bed    Recommendations for Other Services    Frequency     Progress towards PT Goals Progress towards PT goals: Goals met and updated - see care plan  Plan Current plan remains appropriate    Precautions / Restrictions Precautions Precautions: Fall   Pertinent Vitals/Pain SaO2 93% room air during ambulation    Mobility  Bed Mobility Bed Mobility: Not assessed Transfers Transfers: Sit to Stand;Stand to Sit Sit to Stand: 4: Min assist;With upper extremity assist;From chair/3-in-1 Stand to Sit: 4: Min assist;With upper extremity assist;To chair/3-in-1 Details for Transfer Assistance: verbal cues for safe technique including hand placement, feet placement and posture, performed multiple times due to rest breaks Ambulation/Gait Ambulation/Gait Assistance: 4: Min assist Ambulation Distance (Feet): 30 Feet Assistive device: Rolling walker Ambulation/Gait Assistance Details: max verbal cues for RW distance and posture, fatigues quickly requiring 2 seated rest breaks, also reported cramping (points to R knee), SaO2 93% room  air during ambulation Gait Pattern: Step-through pattern;Decreased stride length;Trunk flexed;Shuffle Gait velocity: decreased    Exercises     PT Diagnosis:    PT Problem List:   PT Treatment Interventions:     PT Goals (current goals can now be found in the care plan section)    Visit Information  Last PT Received On: 06/08/13 Assistance Needed: +2 History of Present Illness: 62 yo female h/o dm, pvd, cad comes in with fighting left toe infection for weeks; Relatively recent CVA affecting right side    Subjective Data      Cognition  Cognition Arousal/Alertness: Awake/alert Behavior During Therapy: Flat affect Overall Cognitive Status: Within Functional Limits for tasks assessed    Balance     End of Session PT - End of Session Activity Tolerance: Patient limited by fatigue Patient left: in chair;with call bell/phone within reach;with nursing/sitter in room   GP     Larine Fielding,[name] 06/08/2013, 2:28 PM Carmelia Bake, PT, DPT 06/08/2013 Pager: [phone]

## 2013-06-08 NOTE — Progress Notes (Addendum)
ANTIBIOTIC CONSULT NOTE - FOLLOW UP  Pharmacy Consult for Vancomycin and Zosyn Indication: toe necrosis  No Known Allergies  Patient Measurements: Height: 5\' 2"  (157.5 cm) Weight: 260 lb 2.3 oz (118 kg) IBW/kg (Calculated) : 50.1   Vital Signs: Temp: 98.4 F (36.9 C) (07/21 0955) Temp src: Oral (07/21 0955) BP: 145/40 mmHg (07/21 0955) Pulse Rate: 65 (07/21 0955) Intake/Output from previous day: 07/20 0701 - 07/21 0700 In: 580 [P.O.:480; IV Piggyback:100] Out: 1950 [Urine:1950] Intake/Output from this shift:    Labs:  Recent Labs  06/06/13 0430 06/07/13 0550 06/08/13 0746  WBC 11.1* 9.6 9.6  HGB 7.7* 7.4* 7.5*  PLT 256 263 279  CREATININE 4.57* 4.34* 3.65*   Estimated Creatinine Clearance: 19.8 ml/min (by C-G formula based on Cr of 3.65).  Recent Labs  06/06/13 0430 06/08/13 0746  VANCORANDOM 14.0 15.0     Microbiology: Recent Results (from the past 720 hour(s))  CULTURE, BLOOD (ROUTINE X 2)     Status: None   Collection Time    05/30/13  3:46 PM      Result Value Range Status   Specimen Description BLOOD RIGHT HAND   Final   Special Requests BOTTLES DRAWN AEROBIC ONLY 2CC   Final   Culture  Setup Time 05/30/2013 22:14   Final   Culture NO GROWTH 5 DAYS   Final   Report Status 06/05/2013 FINAL   Final  URINE CULTURE     Status: None   Collection Time    05/31/13  8:34 PM      Result Value Range Status   Specimen Description URINE, RANDOM   Final   Special Requests NONE   Final   Culture  Setup Time 06/01/2013 02:48   Final   Colony Count NO GROWTH   Final   Culture NO GROWTH   Final   Report Status 06/02/2013 FINAL   Final  CULTURE, BLOOD (ROUTINE X 2)     Status: None   Collection Time    06/01/13  7:30 AM      Result Value Range Status   Specimen Description BLOOD LEFT ARM   Final   Special Requests BOTTLES DRAWN AEROBIC ONLY 10CC   Final   Culture  Setup Time 06/01/2013 14:17   Final   Culture NO GROWTH 5 DAYS   Final   Report Status  06/07/2013 FINAL   Final  CULTURE, BLOOD (ROUTINE X 2)     Status: None   Collection Time    06/01/13  7:35 AM      Result Value Range Status   Specimen Description BLOOD LEFT HAND   Final   Special Requests BOTTLES DRAWN AEROBIC ONLY 10CC   Final   Culture  Setup Time 06/01/2013 14:16   Final   Culture NO GROWTH 5 DAYS   Final   Report Status 06/07/2013 FINAL   Final  URINE CULTURE     Status: None   Collection Time    06/03/13 12:15 PM      Result Value Range Status   Specimen Description URINE, CATHETERIZED   Final   Special Requests NONE   Final   Culture  Setup Time 06/03/2013 13:03   Final   Colony Count NO GROWTH   Final   Culture NO GROWTH   Final   Report Status 06/04/2013 FINAL   Final   Assessment: 63yo female with toe necrosis, dry gangrene.  Vancomycin had been held due to AKI and acute rise in Cr,  however has been decreasing with SCr today 3.65.  She has received HD x 1 7/17 with no plans for further HD at this time.  Today is day #10 of Vancomycin. Random vancomycin level drawn today was 59mcg/mL.  Blood cultures negative, WBC nml and patient is afebrile  Goal of Therapy:  Vancomycin trough level 10-15 mcg/ml  Plan:  1.  Vancomycin 1000mg  IV x 1 2. Random vancomycin level ordered for 0500 on 7/23 3. Will redose as appropriate- as renal function has continued to improve, likely will be able to enter a standing order on 7/23. 4. Continue Zosyn 2.25gm IV q8hr 5. Follow up LOT and cultures/sensitivities  Espyn Radwan D. Jonessa Triplett, PharmD Clinical Pharmacist Pager: [phone] 06/08/2013 12:03 PM

## 2013-06-08 NOTE — Progress Notes (Signed)
Patient ID: [name]  female  [id number]    DOB: [birth year]    DOA: 05/29/2013  PCP: Hayden Rasmussen., MD  Assessment/Plan:  AKI on CKD5  - Cr improving after HD, today 3.65 - UA consistent with ATN, urine cultures showed no growth   - renal U/S did not show any acute hydronephrosis or obstruction - Temporary HD catheter placed by IR, started on first hemodialysis on 7/18. 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 Sharol Given for following up, recommended conservative management to see if autoamputates  H/o CVA   Lethargy- d/c'd neurontin- improving, possibly due to uremia . Improved significantly, patient now alert and awake and oriented  DVT Prophylaxis:  Code Status:  Disposition: will await final renal plans, pt wants to be DC'ed home. D/w plan with daughter, Peter Congo     Subjective: Alert and oriented, no specific complaints, wants to go home  Objective: Weight change: 0.428 kg (15.1 oz)  Intake/Output Summary (Last 24 hours) at 06/08/13 1214 Last data filed at 06/08/13 0604  Gross per 24 hour  Intake    220 ml  Output   1950 ml  Net  -1730 ml   Blood pressure 145/40, pulse 65, temperature 98.4 F (36.9 C), temperature source Oral, resp. rate 18, height 5\' 2"  (1.575 m), weight 118 kg (260 lb 2.3 oz), SpO2 93.00%.  Physical Exam: General: Alert and oriented, pleasant and cooperative  CVS: S1-S2 clear  Chest: CTAB no wheezing, rales or  rhonchi Abdomen: Obese, soft NT, ND, NBS  Extremities: no c/c. Left 2nd toe dry gangrene, foul smelling. Small blood blister above the heel   Lab Results: Basic Metabolic Panel:  Recent Labs Lab 06/04/13 0600  06/07/13 0550 06/08/13 0746  NA 134*  < > 136 134*  K 5.1  < > 4.5 3.9  CL 100  < > 102 99  CO2 17*  < > 21 21  GLUCOSE 202*  < > 249* 260*  BUN 90*  < > 73* 70*  CREATININE 7.23*  < > 4.34* 3.65*  CALCIUM 8.8  < > 8.8 9.1  PHOS 9.9*  --   --   --   < > = values in this interval not displayed. Liver Function Tests:  Recent Labs Lab 06/03/13 0545 06/04/13 0600  ALBUMIN 2.1* 2.1*   No results found for this basename: LIPASE, AMYLASE,  in the last 168 hours  Recent Labs Lab 06/01/13 1942  AMMONIA 23   CBC:  Recent Labs Lab 06/07/13 0550 06/08/13 0746  WBC 9.6 9.6  HGB 7.4* 7.5*  HCT 23.1* 23.4*  MCV 85.6 86.0  PLT 263 279   CBG:  Recent Labs Lab 06/07/13 1215 06/07/13 1656 06/07/13 2132 06/08/13 0801 06/08/13 1132  GLUCAP 315* 229* 270* 254* 250*     Micro Results: Recent Results (from the past 240 hour(s))  CULTURE, BLOOD (ROUTINE X 2)     Status: None   Collection Time    05/30/13  3:46 PM      Result Value Range Status   Specimen Description BLOOD RIGHT HAND   Final   Special Requests BOTTLES DRAWN AEROBIC ONLY 2CC   Final   Culture  Setup Time 05/30/2013 22:14   Final   Culture NO GROWTH 5 DAYS   Final   Report Status 06/05/2013 FINAL   Final  URINE CULTURE     Status: None   Collection Time    05/31/13  8:34 PM      Result Value Range Status   Specimen Description URINE, RANDOM   Final   Special Requests NONE   Final   Culture  Setup Time 06/01/2013 02:48   Final   Colony Count NO GROWTH   Final   Culture NO GROWTH   Final   Report Status 06/02/2013 FINAL   Final  CULTURE, BLOOD (ROUTINE X 2)     Status: None   Collection Time    06/01/13  7:30 AM      Result Value Range Status   Specimen Description BLOOD LEFT ARM   Final    Special Requests BOTTLES DRAWN AEROBIC ONLY 10CC   Final   Culture  Setup Time 06/01/2013 14:17   Final   Culture NO GROWTH 5 DAYS   Final   Report Status 06/07/2013 FINAL   Final  CULTURE, BLOOD (ROUTINE X 2)     Status: None   Collection Time    06/01/13  7:35 AM      Result Value Range Status   Specimen Description BLOOD LEFT HAND   Final   Special Requests BOTTLES DRAWN AEROBIC ONLY 10CC   Final   Culture  Setup Time 06/01/2013 14:16   Final   Culture NO GROWTH 5 DAYS   Final   Report Status 06/07/2013 FINAL   Final  URINE CULTURE     Status: None   Collection Time    06/03/13 12:15 PM      Result Value Range Status   Specimen Description URINE, CATHETERIZED   Final   Special Requests NONE   Final   Culture  Setup Time 06/03/2013 13:03   Final   Colony Count NO GROWTH   Final   Culture NO GROWTH   Final   Report Status 06/04/2013 FINAL   Final    Studies/Results: Ct Head Wo Contrast  05/30/2013   *RADIOLOGY REPORT*  Clinical Data: Left-sided weakness.  CT HEAD WITHOUT CONTRAST  Technique:  Contiguous axial images were obtained from the base of the skull through the vertex without contrast.  Comparison: 03/19/2013.  Findings: Large, old right cerebellar hemisphere infarct.  Old right occipital lobe infarct.  Patchy white matter low density in both cerebral hemispheres.  Minimally enlarged ventricles and subarachnoid spaces.  No intracranial hemorrhage, mass lesion or CT evidence of acute infarction.  Unremarkable bones and paranasal sinuses.  Stable left frontal probable venous lake.  IMPRESSION:  1.  No acute abnormality. 2.  Old infarcts, chronic small vessel white matter ischemic changes and minimal atrophy, as described above.   Original Report Authenticated By: Claudie Revering, M.D.   US Renal  06/01/2013   *RADIOLOGY REPORT*  Clinical Data: Acute renal failure chronic renal disease.  RENAL/URINARY TRACT ULTRASOUND COMPLETE  Comparison:  CT abdomen dated 11/27/2010  Findings: The  study is limited by technique and body habitus.  Right Kidney:  11.6 cm in length.  No hydronephrosis.  Cortical echogenicity is within normal limits.  1.5 cm hypoechoic exophytic lesion  from the lower pole of the right kidney is likely a cyst.  Left Kidney:  11.8 cm in length.  No hydronephrosis or mass.  Bladder:  Unremarkable.  IMPRESSION: Probable benign cyst in the lower pole of the right kidney.  No evidence of hydronephrosis.   Original Report Authenticated By: Marybelle Killings, M.D.   Ir Fluoro Guide Cv Line Right  06/04/2013   *RADIOLOGY REPORT*  Clinical Data/Indication: RENAL FAILURE  IR RIGHT FLOURO GUIDE CV LINE,IR ULTRASOUND GUIDANCE VASC ACCESS RIGHT  Fluoroscopy Time: 18 seconds.  Procedure: The procedure, risks, benefits, and alternatives were explained to the patient. Questions regarding the procedure were encouraged and answered. The patient understands and consents to the procedure.  The right neck was prepped with betadine in a sterile fashion, and a sterile drape was applied covering the operative field. A sterile gown and sterile gloves were used for the procedure.  Under sonographic guidance, a micropuncture needle was inserted into the right internal jugular vein and removed over a 0018 wire which was upsized to a three J E.  Sonographic and fluoroscopic documentation was obtained.  The dialysis catheter was then inserted over the wire to the cavoatrial junction, was flushed, was then sewn in place.  Findings: The tip of the dialysis catheter is at cavoatrial junction.  Complications: None.  IMPRESSION: Successful right internal jugular vein temporary dialysis catheter placement.   Original Report Authenticated By: Marybelle Killings, M.D.   Ir US Guide Vasc Access Right  06/04/2013   *RADIOLOGY REPORT*  Clinical Data/Indication: RENAL FAILURE  IR RIGHT FLOURO GUIDE CV LINE,IR ULTRASOUND GUIDANCE VASC ACCESS RIGHT  Fluoroscopy Time: 18 seconds.  Procedure: The procedure, risks, benefits, and  alternatives were explained to the patient. Questions regarding the procedure were encouraged and answered. The patient understands and consents to the procedure.  The right neck was prepped with betadine in a sterile fashion, and a sterile drape was applied covering the operative field. A sterile gown and sterile gloves were used for the procedure.  Under sonographic guidance, a micropuncture needle was inserted into the right internal jugular vein and removed over a 0018 wire which was upsized to a three J E.  Sonographic and fluoroscopic documentation was obtained.  The dialysis catheter was then inserted over the wire to the cavoatrial junction, was flushed, was then sewn in place.  Findings: The tip of the dialysis catheter is at cavoatrial junction.  Complications: None.  IMPRESSION: Successful right internal jugular vein temporary dialysis catheter placement.   Original Report Authenticated By: Marybelle Killings, M.D.   Dg Toe 2nd Left  05/30/2013   *RADIOLOGY REPORT*  Clinical Data: Wound infection of second digit left foot.  LEFT SECOND TOE  Comparison: None.  Findings: Diffuse vascular calcifications in the left foot.  No radiopaque foreign bodies or gas collections in the soft tissues. Bones appear intact.  No evidence of bone erosion or cortical irregularity to suggest osteomyelitis.  No acute fracture or subluxation.  Diffuse bone demineralization.  IMPRESSION: No acute bony abnormalities.  No evidence of osteomyelitis.   Original Report Authenticated By: Lucienne Capers, M.D.    Medications: Scheduled Meds: . alteplase  4 mg Intracatheter Once  . aspirin EC  81 mg Oral Daily  . atorvastatin  40 mg Oral q1800  . carvedilol  12.5 mg Oral BID WC  . darbepoetin (ARANESP) injection - NON-DIALYSIS  100 mcg Subcutaneous Q Wed-1800  . feeding supplement (NEPRO CARB STEADY)  237 mL Oral BID BM  . furosemide  40 mg Oral BID  . insulin aspart  0-15 Units Subcutaneous TID WC  . insulin aspart  0-5 Units  Subcutaneous QHS  . insulin aspart  3 Units Subcutaneous TID WC  . insulin glargine  10 Units Subcutaneous QHS  . omega-3 acid ethyl esters  2 g Oral Daily  . piperacillin-tazobactam (ZOSYN)  IV  2.25 g Intravenous Q8H  . vancomycin  1,000 mg Intravenous Once      LOS: 10 days   Danon Lograsso M.D. [facility] 06/08/2013, 12:14 PM Pager: [phone]  If 7PM-7AM, please contact night-coverage [email or web address] Password [email or web address]

## 2013-06-09 LAB — BASIC METABOLIC PANEL
BUN: 68 mg/dL — ABNORMAL HIGH (ref 6–23)
CO2: 23 mEq/L (ref 19–32)
Calcium: 9 mg/dL (ref 8.4–10.5)
Chloride: 102 mEq/L (ref 96–112)
Creatinine, Ser: 3.21 mg/dL — ABNORMAL HIGH (ref 0.50–1.10)
Glucose, Bld: 252 mg/dL — ABNORMAL HIGH (ref 70–99)

## 2013-06-09 LAB — GLUCOSE, CAPILLARY
Glucose-Capillary: 226 mg/dL — ABNORMAL HIGH (ref 70–99)
Glucose-Capillary: 255 mg/dL — ABNORMAL HIGH (ref 70–99)

## 2013-06-09 MED ORDER — DOXYCYCLINE HYCLATE 100 MG PO TABS
100.0000 mg | ORAL_TABLET | Freq: Two times a day (BID) | ORAL | Status: DC
  Administered 2013-06-10: 100 mg via ORAL
  Filled 2013-06-09 (×4): qty 1

## 2013-06-09 MED ORDER — INSULIN GLARGINE 100 UNIT/ML ~~LOC~~ SOLN
20.0000 [IU] | Freq: Every day | SUBCUTANEOUS | Status: DC
  Administered 2013-06-09: 20 [IU] via SUBCUTANEOUS
  Filled 2013-06-09 (×2): qty 0.2

## 2013-06-09 MED ORDER — DOXYCYCLINE HYCLATE 100 MG PO TABS: 100.0000 mg | ORAL_TABLET | Freq: Two times a day (BID) | ORAL | Status: DC

## 2013-06-09 MED ORDER — INSULIN GLARGINE 100 UNIT/ML ~~LOC~~ SOLN: 20.0000 [IU] | Freq: Every day | SUBCUTANEOUS | Status: DC

## 2013-06-09 MED ORDER — INSULIN ASPART 100 UNIT/ML ~~LOC~~ SOLN: 0.0000 [IU] | Freq: Three times a day (TID) | SUBCUTANEOUS | Status: DC

## 2013-06-09 MED ORDER — DOXYCYCLINE HYCLATE 100 MG PO TABS
100.0000 mg | ORAL_TABLET | Freq: Two times a day (BID) | ORAL | Status: DC
  Filled 2013-06-09 (×3): qty 1

## 2013-06-09 MED ORDER — FUROSEMIDE 40 MG PO TABS: 40.0000 mg | ORAL_TABLET | Freq: Two times a day (BID) | ORAL | Status: DC

## 2013-06-09 NOTE — Discharge Summary (Signed)
Physician Discharge Summary  Patient ID: [name] MRN: [id number] DOB/AGE: [birth year] 62 y.o.  Admit date: 05/29/2013 Discharge date: 06/10/2013  Primary Care Physician:  Hayden Rasmussen., MD  Discharge Diagnoses:   Gangrene left second toe  . Anemia of chronic disease . CAD (coronary artery disease), native coronary artery . acute on chronic CKD  . Peripheral arterial disease . Type II or unspecified type diabetes mellitus with renal manifestations, uncontrolled(250.42) . deconditioning  . Obesity, Class III, BMI 40-49.9 (morbid obesity) . Hyperlipidemia LDL goal <70 . Hypertension associated with diabetes  Consults:  Nephrology                   Interventional radiology                   Orthopedics, Dr. Sharol Given                    Cardiology, Dr. Gwenlyn Found  Recommendations for Outpatient Follow-up:  1. please check BMET in 1 week after hospital dc 2. patient needs appointment with Dr. Sharol Given in 2 weeks to assess in the left toe wound  Allergies:  No Known Allergies   Discharge Medications:   Medication List    STOP taking these medications       amLODipine 5 MG tablet  Commonly known as:  NORVASC     amoxicillin-clavulanate 875-125 MG per tablet  Commonly known as:  AUGMENTIN     gabapentin 100 MG capsule  Commonly known as:  NEURONTIN     losartan-hydrochlorothiazide 100-25 MG per tablet  Commonly known as:  HYZAAR      TAKE these medications       aspirin EC 81 MG tablet  Take 81 mg by mouth daily.     atorvastatin 40 MG tablet  Commonly known as:  LIPITOR  Take 1 tablet (40 mg total) by mouth daily at 6 PM.     carvedilol 12.5 MG tablet  Commonly known as:  COREG  Take 12.5 mg by mouth 2 (two) times daily with a meal.     doxycycline 100 MG tablet  Commonly known as:  VIBRA-TABS  Take 1 tablet (100 mg total) by mouth 2 (two) times daily. X 2 weeks  Start taking on:  06/10/2013     furosemide 40 MG tablet  Commonly known as:  LASIX  Take 1 tablet  (40 mg total) by mouth 2 (two) times daily.     insulin aspart 100 UNIT/ML injection  Commonly known as:  novoLOG  Inject 0-15 Units into the skin 3 (three) times daily with meals. Sliding scale insulin CBG 70 - 120: 0 units: CBG 121 - 150: 2 units; CBG 151 - 200: 3 units; CBG 201 - 250: 5 units; CBG 251 - 300: 8 units;CBG 301 - 350: 11 units; CBG 351 - 400: 15 units; CBG > 400 : 15 units and notify your doctor     insulin glargine 100 UNIT/ML injection  Commonly known as:  LANTUS  Inject 0.2 mLs (20 Units total) into the skin at bedtime.     omega-3 acid ethyl esters 1 G capsule  Commonly known as:  LOVAZA  Take 2 g by mouth daily.     silver sulfADIAZINE 1 % cream  Commonly known as:  SILVADENE  Apply 1 application topically daily. Apply to toe         Brief H and P: For complete details please refer to admission H and P, but  in brief 62 yo female h/o dm, pvd, cad presented with left toe infection for weeks. She had been on oral antibiotics and had recent abi showing moderate reduced flow to left lower extremity. She has had necrosis to this toe that has progressively gotten worse and now with some mild erythema. She has no pain due to peripheral neuropathy from her diabetes. She denied fevers. She has an appt with dr berry next week to look at her vascular studies and examine the foot.    Hospital Course:  62 yo female h/o dm, pvd, cad presented with left second toe wound for several weeks, failure to treatment with oral antibiotics. She had recent ABIs done which showed moderate reduced flow to left lower extremity. The wound had progressively worsened to the gangrene. Patient was admitted for further workup. Patient was also noticed to have renal failure on CK D. and renal consult was obtained.   AKI on CKD5 : Patient was admitted with antibiotics, usual outpatient meds were also continued including Hyzaar and Norvasc. During hospitalization she was noticed to have some hypotension  in the 70s and high so it was stopped creatinine continued to trend up to 7.23 on 06/04/2013. Renal service was consulted, renal ultrasound done was unremarkable and showed no hydronephrosis or obstruction. UA consistent with ATN but urine cultures showed no growth . She was placed on vancomycin and Zosyn for the foot wound. Interventional radiology was consulted and patient had a temporary HD catheter placed. She started on her first hemodialysis on 7/18. Patient was noticed to be somewhat lethargic and due to concern for uremia hemodialysis was initiated. Patient did not require any further hemodialysis and her creatinine has been running down nicely. Renal service plan to discontinue dialysis catheter today and monitor creatinine function without the dialysis cath.  Anemia - transfuse when < 7 :  Transfused 2 units on 7/13, cont aranesp.   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 Patient was followed by cardiology, Dr. Alvester Chou and had no plans in the future for peripheral angiogram due to acute renal failure. Orthopedics was consulted, Dr. Sharol Given, recommended conservative management to see if it autoamputates. I discussed in detail with Dr. Sharol Given and Dr. Megan Salon from ID today, recommended to discontinue vancomycin and Zosyn. Dr. Sharol Given requested the patient to be on doxycycline and followup in 2 weeks in the office. Dr. Sharol Given also discussed with the patient regarding surgical intervention if the wound does not heal.  H/o CVA : stable  Lethargy- d/c'd neurontin- improving, possibly due to uremia . Improved significantly, patient now alert and awake and oriented    generalized deconditioning: Will arrange home  health PT, OT, RN followup  Day of Discharge BP 136/55  Pulse 67  Temp(Src) 98.6 F (37 C) (Oral)  Resp 18  Ht 5\' 2"  (1.575 m)  Wt 114.896 kg (253 lb 4.8 oz)  BMI 46.32 kg/m2  SpO2 100%  Physical Exam: General: Alert and awake oriented x3 not in any acute distress. CVS: S1-S2 clear no murmur rubs or gallops Chest: clear to auscultation bilaterally, no wheezing rales or rhonchi Abdomen: soft nontender, nondistended, normal bowel sounds Extremities: no cyanosis, clubbing Left 2nd toe dry gangrene     The results  of significant diagnostics from this hospitalization (including imaging, microbiology, ancillary and laboratory) are listed below for reference.    LAB RESULTS: Basic Metabolic Panel:  Recent Labs Lab 06/04/13 0600  06/08/13 0746 06/09/13 0525  NA 134*  < > 134* 136  K 5.1  < > 3.9 4.0  CL 100  < > 99 102  CO2 17*  < > 21 23  GLUCOSE 202*  < > 260* 252*  BUN 90*  < > 70* 68*  CREATININE 7.23*  < > 3.65* 3.21*  CALCIUM 8.8  < > 9.1 9.0  PHOS 9.9*  --   --   --   < > = values in this interval not displayed. Liver Function Tests:  Recent Labs Lab 06/03/13 0545 06/04/13 0600  ALBUMIN 2.1* 2.1*   No results found for this basename: LIPASE, AMYLASE,  in the last 168 hours No results found for this basename: AMMONIA,  in the last 168 hours CBC:  Recent Labs Lab 06/07/13 0550 06/08/13 0746  WBC 9.6 9.6  HGB 7.4* 7.5*  HCT 23.1* 23.4*  MCV 85.6 86.0  PLT 263 279   Cardiac Enzymes: No results found for this basename: CKTOTAL, CKMB, CKMBINDEX, TROPONINI,  in the last 168 hours BNP: No components found with this basename: POCBNP,  CBG:  Recent Labs Lab 06/09/13 0739 06/09/13 1125  GLUCAP 226* 236*    Significant Diagnostic Studies:  Ct Head Wo Contrast  05/30/2013   *RADIOLOGY REPORT*  Clinical Data: Left-sided weakness.  CT HEAD WITHOUT CONTRAST  Technique:  Contiguous axial images were obtained from the base of the skull through the vertex  without contrast.  Comparison: 03/19/2013.  Findings: Large, old right cerebellar hemisphere infarct.  Old right occipital lobe infarct.  Patchy white matter low density in both cerebral hemispheres.  Minimally enlarged ventricles and subarachnoid spaces.  No intracranial hemorrhage, mass lesion or CT evidence of acute infarction.  Unremarkable bones and paranasal sinuses.  Stable left frontal probable venous lake.  IMPRESSION:  1.  No acute abnormality. 2.  Old infarcts, chronic small vessel white matter ischemic changes and minimal atrophy, as described above.   Original Report Authenticated By: Claudie Revering, M.D.   Dg Toe 2nd Left  05/30/2013   *RADIOLOGY REPORT*  Clinical Data: Wound infection of second digit left foot.  LEFT SECOND TOE  Comparison: None.  Findings: Diffuse vascular calcifications in the left foot.  No radiopaque foreign bodies or gas collections in the soft tissues. Bones appear intact.  No evidence of bone erosion or cortical irregularity to suggest osteomyelitis.  No acute fracture or subluxation.  Diffuse bone demineralization.  IMPRESSION: No acute bony abnormalities.  No evidence of osteomyelitis.   Original Report Authenticated By: Lucienne Capers, M.D.       Disposition and Follow-up: Discharge Orders   Future Appointments Provider Department Dept Phone   06/22/2013 10:45 AM Leonie Man, MD [facility] [phone]   06/23/2013 2:00 PM Philmore Pali, NP [facility] [phone]   Future Orders Complete By Expires     Diet Carb Modified  As directed     Discharge instructions  As directed     Comments:      Wound care:Left foot, second digit:  Apply dry dressing using single 2x2 inch gauze (opened) to loosely wrap digit. Please folded 4x4 beneath toe and folded 4x4 over top of toes.  Wrap with kerlix roll gauze to secure.  Change daily.  Increase activity slowly  As directed         DISPOSITION: Home  DIET:  Carb modified diet  TESTS THAT NEED FOLLOW-UP BMET in 1 week  DISCHARGE FOLLOW-UP Follow-up Information   Follow up with DUDA,MARCUS V, MD. Schedule an appointment as soon as possible for a visit in 2 weeks.   Contact information:   [place] [address] [phone]       Follow up with Horald Pollen L., MD. Schedule an appointment as soon as possible for a visit in 1 week. (you need renal function checked)    Contact information:   [place] [address] [phone]       Follow up with Leonie Man, MD On 06/22/2013. (At 10: 45 am)    Contact information:   [place] [address] [phone]       Time spent on Discharge: 35 mins  Signed:   Reign Dziuba M.D. [facility] 06/09/2013, 1:58 PM Pager: [phone]

## 2013-06-09 NOTE — Progress Notes (Addendum)
NUTRITION FOLLOW UP/CONSULT  DOCUMENTATION CODES  Per approved criteria   -Morbid Obesity    Intervention:   RD provided Renal Diet education during this visit. Discontinue scheduled Nepro Shakes. RD to continue to follow nutrition care plan.  Nutrition Dx:   Increased nutrient needs now related to foot ulcer as evidenced by estimated needs.   Goal:   Pt to meet >/= 90% of their estimated nutrition needs. Met.  Monitor:   PO intake, supplement acceptance, weight trend, labs  Assessment:   Pt with AKI on CKD5 and peripheral arterial disease with left 2nd toe gangrene. Pt started temporary HD 7/18, per MD note unclear if pt will remain on HD. Per renal, if SCr continues to trend down, plan to d/c HD cath. Placed on Renal Diet restrictions yesterday. RD consulted to review information with RN.  Provided Chronic Kidney Disease Pyramid to patient. Reviewed food groups and provided written recommended serving sizes specifically determined for patient's current nutritional status. Explained why diet restrictions are needed and provided lists of foods to limit/avoid that are high potassium, sodium, and phosphorus. Provided specific recommendations on safer alternatives of these foods. Strongly encouraged compliance of this diet. Teach back method used.  Expect fair compliance.  Pt is eating 100% of meals at this time. Likely meeting calorie and protein needs. Will discontinue oral nutrition supplements.  Potassium WNL. Phosphorus remains high, most recent phosphorus 9.9.   Height: Ht Readings from Last 1 Encounters:  06/04/13 5\' 2"  (1.575 m)    Weight Status:   Wt Readings from Last 1 Encounters:  06/08/13 253 lb 4.8 oz (114.896 kg)  Stable.  Re-estimated needs:  Kcal: 2000 - 2200 Protein: 75 - 85 g Fluid: 1.2 liters daily  Skin:  Gangrene to toe  Diet Order: Renal 80-90; 1200 ml fluid restriction   Intake/Output Summary (Last 24 hours) at 06/09/13 0940 Last data filed  at 06/09/13 0800  Gross per 24 hour  Intake    238 ml  Output   2950 ml  Net  -2712 ml    Last BM: 7/21   Labs:   Recent Labs Lab 06/03/13 0545 06/04/13 0600  06/07/13 0550 06/08/13 0746 06/09/13 0525  NA 136 134*  < > 136 134* 136  K 5.0 5.1  < > 4.5 3.9 4.0  CL 101 100  < > 102 99 102  CO2 18* 17*  < > 21 21 23   BUN 82* 90*  < > 73* 70* 68*  CREATININE 7.18* 7.23*  < > 4.34* 3.65* 3.21*  CALCIUM 8.6 8.8  < > 8.8 9.1 9.0  PHOS 8.7* 9.9*  --   --   --   --   GLUCOSE 148* 202*  < > 249* 260* 252*  < > = values in this interval not displayed.  CBG (last 3)   Recent Labs  06/08/13 1626 06/08/13 2136 06/09/13 0739  GLUCAP 263* 266* 226*    Scheduled Meds: . alteplase  4 mg Intracatheter Once  . aspirin EC  81 mg Oral Daily  . atorvastatin  40 mg Oral q1800  . carvedilol  12.5 mg Oral BID WC  . darbepoetin (ARANESP) injection - NON-DIALYSIS  100 mcg Subcutaneous Q Wed-1800  . [START ON 06/10/2013] doxycycline  100 mg Oral BID  . feeding supplement (NEPRO CARB STEADY)  237 mL Oral BID BM  . furosemide  40 mg Oral BID  . insulin aspart  0-15 Units Subcutaneous TID WC  . insulin aspart  0-5 Units Subcutaneous QHS  . insulin aspart  3 Units Subcutaneous TID WC  . insulin glargine  20 Units Subcutaneous QHS  . omega-3 acid ethyl esters  2 g Oral Daily  . piperacillin-tazobactam (ZOSYN)  IV  2.25 g Intravenous Q8H    Continuous Infusions:  none  Inda Coke MS, RD, LDN Pager: [phone] After-hours pager: [phone]

## 2013-06-09 NOTE — Progress Notes (Signed)
Right IJ HD cath removed per MD order - pressure to site 15 minutes - pressure dressing to site - famil/RN instructed to keep dressing on x 24 hours then replace with Bandaid as needed.

## 2013-06-09 NOTE — Progress Notes (Signed)
S: No new CO.   O:BP 143/54  Pulse 60  Temp(Src) 98.4 F (36.9 C) (Oral)  Resp 16  Ht 5\' 2"  (1.575 m)  Wt 114.896 kg (253 lb 4.8 oz)  BMI 46.32 kg/m2  SpO2 100%  Intake/Output Summary (Last 24 hours) at 06/09/13 [id number] Last data filed at 06/09/13 0800  Gross per 24 hour  Intake    238 ml  Output   2950 ml  Net  -2712 ml   Weight change: -3.104 kg (-6 lb 13.5 oz) [id number] and alert CVS:RRR Resp:clear Abd:+ BS NTND Ext: Bandage on Lt 2nd toe NEURO: CNI Ox3 no asterixis Rt IJ temp HD cath  . alteplase  4 mg Intracatheter Once  . aspirin EC  81 mg Oral Daily  . atorvastatin  40 mg Oral q1800  . carvedilol  12.5 mg Oral BID WC  . darbepoetin (ARANESP) injection - NON-DIALYSIS  100 mcg Subcutaneous Q Wed-1800  . [START ON 06/10/2013] doxycycline  100 mg Oral BID  . furosemide  40 mg Oral BID  . insulin aspart  0-15 Units Subcutaneous TID WC  . insulin aspart  0-5 Units Subcutaneous QHS  . insulin aspart  3 Units Subcutaneous TID WC  . insulin glargine  20 Units Subcutaneous QHS  . omega-3 acid ethyl esters  2 g Oral Daily  . piperacillin-tazobactam (ZOSYN)  IV  2.25 g Intravenous Q8H   No results found. BMET    Component Value Date/Time   NA 136 06/09/2013 0525   K 4.0 06/09/2013 0525   CL 102 06/09/2013 0525   CO2 23 06/09/2013 0525   GLUCOSE 252* 06/09/2013 0525   BUN 68* 06/09/2013 0525   CREATININE 3.21* 06/09/2013 0525   CALCIUM 9.0 06/09/2013 0525   GFRNONAA 15* 06/09/2013 0525   GFRAA 17* 06/09/2013 0525   CBC    Component Value Date/Time   WBC 9.6 06/08/2013 0746   RBC 2.72* 06/08/2013 0746   HGB 7.5* 06/08/2013 0746   HCT 23.4* 06/08/2013 0746   PLT 279 06/08/2013 0746   MCV 86.0 06/08/2013 0746   MCH 27.6 06/08/2013 0746   MCHC 32.1 06/08/2013 0746   RDW 16.2* 06/08/2013 0746   LYMPHSABS 2.0 05/29/2013 2116   MONOABS 1.3* 05/29/2013 2116   EOSABS 0.1 05/29/2013 2116   BASOSABS 0.0 05/29/2013 2116     Assessment: 1. Acute on CKD, UO good and Scr trending down 2.  Gangrene L 2nd toe 3. Anemia Plan: 1.  Daily SCr 2. DC HD catheter 3.  ? Home in AM if Scr lower 4. DC foley  Winn Muehl T

## 2013-06-09 NOTE — Progress Notes (Signed)
Occupational Therapy Treatment Patient Details Name: [name] MRN: [id number] DOB: 07-11-1951 Today's Date: 06/09/2013 Time: 1200-1230 OT Time Calculation (min): 30 min  OT Assessment / Plan / Recommendation  History of present illness  recent cva, and also diabetic ulcer left foot       OT comments  Agreeable to participation in skilled ot.  Expresses desire to d/c home.  dtr and friend present during session and encouraging pt. throughout                           Frequency Min 2X/week   Progress towards OT Goals Progress towards OT goals: Progressing toward goals  Plan Discharge plan remains appropriate    Precautions / Restrictions Precautions Precautions: Fall Restrictions Weight Bearing Restrictions: No   Pertinent Vitals/Pain No c/o pain per pt.    ADL  Lower Body Dressing: Performed;Supervision/safety Where Assessed - Lower Body Dressing: Unsupported sitting Transfers/Ambulation Related to ADLs: able to sit in un supported sitting and push through b ues and b les to scoot towards hob in prep for good positioning for back to bed ADL Comments: able to sit in un supported sitting and turn and pull each le up onto bed to don/doff socks, dtr present and reports this is how she did this task prior to arrival.       OT Goals(current goals can now be found in the care plan section) Acute Rehab OT Goals Patient Stated Goal: "i'm going home right"  Visit Information  Last OT Received On: 06/09/13                 Cognition  Cognition Arousal/Alertness: Awake/alert Behavior During Therapy: Flat affect Overall Cognitive Status: Within Functional Limits for tasks assessed Area of Impairment: Attention Current Attention Level: Sustained    Mobility  Bed Mobility Scooting to HOB: 5: Supervision Details for Bed Mobility Assistance: cues for tech. to scoot to hob and to push through b ues to initiate the scooting, hob flat and no use of rails to simulate  home environment          Balance  able to sit un supported through out session, seated eob and requested to stay seated in that position at end of session for to eat lunch   End of Session OT - End of Session Activity Tolerance: Patient tolerated treatment well Patient left: in bed;with call bell/phone within reach;with bed alarm set;with family/visitor present       Janice Coffin, COTA/L 06/09/2013, 12:36 PM

## 2013-06-09 NOTE — Progress Notes (Signed)
   CARE MANAGEMENT NOTE 06/09/2013  Patient:  [name],[name]   Account Number:  [id number]  Date Initiated:  06/04/2013  Documentation initiated by:  [facility]  Subjective/Objective Assessment:   admitted with gangrene rt second toe, acute on chronic renal failure     Action/Plan:   PT/OT evals- recommended SNf   Anticipated DC Date:  06/07/2013   Anticipated DC Plan:  SKILLED NURSING FACILITY  In-house referral  Clinical Social Worker      DC Planning Services  CM consult      Choice offered to / List presented to:             Status of service:  In process, will continue to follow Medicare Important Message given?   (If response is "NO", the following Medicare IM given date fields will be blank) Date Medicare IM given:   Date Additional Medicare IM given:    Discharge Disposition:    Per UR Regulation:    If discussed at Long Length of Stay Meetings, dates discussed:   06/04/2013    Comments:  06/09/2013  [place], [place] Spoke with patient and daughters regarding home health services. They were interested in assistance with bathing, housekeeping and preparations of meals. The patient lives at home with family.  Explained skilled home health services and custodial care. Provided a list of private pay home health agencies.  Requested information about transportation assistance. Provided packet with SCAT information.  06/05/2013 [place], [place]   [id number] New start dialysis 06/04/2013  temporary cath placed 06/04/2013 first HD session   06/04/13 Transferred to 6E due to need for temporary hemodialysis. Fuller Plan RN, BSN, CCM

## 2013-06-10 DIAGNOSIS — E1165 Type 2 diabetes mellitus with hyperglycemia: Secondary | ICD-10-CM

## 2013-06-10 LAB — RENAL FUNCTION PANEL
Albumin: 2.1 g/dL — ABNORMAL LOW (ref 3.5–5.2)
BUN: 63 mg/dL — ABNORMAL HIGH (ref 6–23)
Calcium: 9.4 mg/dL (ref 8.4–10.5)
Glucose, Bld: 250 mg/dL — ABNORMAL HIGH (ref 70–99)
Phosphorus: 4.7 mg/dL — ABNORMAL HIGH (ref 2.3–4.6)
Potassium: 4.5 mEq/L (ref 3.5–5.1)
Sodium: 140 mEq/L (ref 135–145)

## 2013-06-10 LAB — GLUCOSE, CAPILLARY: Glucose-Capillary: 233 mg/dL — ABNORMAL HIGH (ref 70–99)

## 2013-06-10 MED ORDER — INSULIN GLARGINE 100 UNIT/ML ~~LOC~~ SOLN
30.0000 [IU] | Freq: Every day | SUBCUTANEOUS | Status: DC
  Filled 2013-06-10: qty 0.3

## 2013-06-10 MED ORDER — INSULIN GLARGINE 100 UNIT/ML ~~LOC~~ SOLN: 30.0000 [IU] | Freq: Every day | SUBCUTANEOUS | Status: DC

## 2013-06-10 MED ORDER — GLUCOSE BLOOD VI STRP: ORAL_STRIP | Status: DC

## 2013-06-10 NOTE — Progress Notes (Signed)
Physical Therapy Treatment Patient Details Name: [name] MRN: [id number] DOB: Aug 07, 1951 Today's Date: 06/10/2013 Time: [id number] PT Time Calculation (min): 27 min  PT Assessment / Plan / Recommendation  History of Present Illness 62 yo female h/o dm, pvd, cad comes in with fighting left toe infection for weeks; Relatively recent CVA affecting right side      PT Comments   Pt wants to d/c home, not agreeable for rehab.  Recommended supervision for mobility to assist with seated rest breaks and assist for stairs as pt reports 6 steps to bedroom and any bathroom.  Please see equipment recommendations below.   Follow Up Recommendations  SNF;Supervision/Assistance - 24 hour (however pt declines so HHPT)     Does the patient have the potential to tolerate intense rehabilitation     Barriers to Discharge        Equipment Recommendations  Wheelchair (measurements PT);Wheelchair cushion (measurements PT);Hospital bed;3in1 (PT)    Recommendations for Other Services    Frequency     Progress towards PT Goals Progress towards PT goals: Progressing toward goals  Plan Current plan remains appropriate    Precautions / Restrictions Precautions Precautions: Fall   Pertinent Vitals/Pain Rest breaks for fatigue, see below for SaO2    Mobility  Bed Mobility Bed Mobility: Not assessed Details for Bed Mobility Assistance: sitting EOB upon arrival and departure Transfers Transfers: Sit to Stand;Stand to Sit;Stand Pivot Transfers Sit to Stand: 4: Min guard Stand to Sit: 4: Min guard Stand Pivot Transfers: 4: Min guard Details for Transfer Assistance: verbal cues for safe technique including hand placement, feet placement and posture, performed multiple times due to rest breaks, pt assisted to [facility] prior to ambulating and required verbal cue for donning shoe for L foot Ambulation/Gait Ambulation/Gait Assistance: 4: Min guard Ambulation Distance (Feet): 70 Feet Assistive device: Rolling  walker Ambulation/Gait Assistance Details: max verbal cues for RW distance and posture, continues to fatigue quickly requiring 4 seated rest breaks, SaO2 94% room air during ambulation Gait Pattern: Step-through pattern;Decreased stride length;Trunk flexed;Shuffle Gait velocity: decreased    Exercises     PT Diagnosis:    PT Problem List:   PT Treatment Interventions:     PT Goals (current goals can now be found in the care plan section)    Visit Information  Last PT Received On: 06/10/13 Assistance Needed: +1 History of Present Illness: 62 yo female h/o dm, pvd, cad comes in with fighting left toe infection for weeks; Relatively recent CVA affecting right side    Subjective Data      Cognition  Cognition Arousal/Alertness: Awake/alert Behavior During Therapy: Flat affect Overall Cognitive Status: Within Functional Limits for tasks assessed    Balance     End of Session PT - End of Session Activity Tolerance: Patient limited by fatigue Patient left: with call bell/phone within reach;in bed   GP     [name],[name] 06/10/2013, 1:06 PM [name], PT, DPT 06/10/2013 Pager: [phone]

## 2013-06-10 NOTE — Progress Notes (Signed)
06/10/2013  [place], [place] Call to daughter [name]  [phone] regarding discharge plans for home health agency. She selected [facility] for RN, PT, OT and aide. Reviewed DME orders for hospital bed and 3:1. Discussed patient's mobility at home. She gets around with her walker. Explained the importance of having someone with her when she ambulates and she noted there is always someone with her.    [facility]/Donna: called with referral for RN, PT, OT and aide.

## 2013-06-10 NOTE — Progress Notes (Signed)
Discussed discharge instructions and medications with pt. Pt showed no barriers to discharge. IV removed. Pt discharged to home with family. Assessment unchanged from morning.  

## 2013-06-10 NOTE — Progress Notes (Signed)
S: No new CO.  Wants to go home O:BP 145/72  Pulse 64  Temp(Src) 98.9 F (37.2 C) (Oral)  Resp 19  Ht 5\' 2"  (1.575 m)  Wt 115.123 kg (253 lb 12.8 oz)  BMI 46.41 kg/m2  SpO2 97%  Intake/Output Summary (Last 24 hours) at 06/10/13 1037 Last data filed at 06/10/13 0455  Gross per 24 hour  Intake    480 ml  Output   1200 ml  Net   -720 ml   Weight change: 0.227 kg (8 oz) [id number] and alert CVS:RRR Resp:clear Abd:+ BS NTND Ext: Bandage on Lt 2nd toe. Trace edema NEURO: CNI Ox3 no asterixis Rt IJ temp HD cath  . alteplase  4 mg Intracatheter Once  . aspirin EC  81 mg Oral Daily  . atorvastatin  40 mg Oral q1800  . carvedilol  12.5 mg Oral BID WC  . darbepoetin (ARANESP) injection - NON-DIALYSIS  100 mcg Subcutaneous Q Wed-1800  . doxycycline  100 mg Oral BID  . furosemide  40 mg Oral BID  . insulin aspart  0-15 Units Subcutaneous TID WC  . insulin aspart  0-5 Units Subcutaneous QHS  . insulin aspart  3 Units Subcutaneous TID WC  . insulin glargine  20 Units Subcutaneous QHS  . omega-3 acid ethyl esters  2 g Oral Daily   No results found. BMET    Component Value Date/Time   NA 140 06/10/2013 0600   K 4.5 06/10/2013 0600   CL 103 06/10/2013 0600   CO2 25 06/10/2013 0600   GLUCOSE 250* 06/10/2013 0600   BUN 63* 06/10/2013 0600   CREATININE 3.04* 06/10/2013 0600   CALCIUM 9.4 06/10/2013 0600   GFRNONAA 16* 06/10/2013 0600   GFRAA 18* 06/10/2013 0600   CBC    Component Value Date/Time   WBC 9.6 06/08/2013 0746   RBC 2.72* 06/08/2013 0746   HGB 7.5* 06/08/2013 0746   HCT 23.4* 06/08/2013 0746   PLT 279 06/08/2013 0746   MCV 86.0 06/08/2013 0746   MCH 27.6 06/08/2013 0746   MCHC 32.1 06/08/2013 0746   RDW 16.2* 06/08/2013 0746   LYMPHSABS 2.0 05/29/2013 2116   MONOABS 1.3* 05/29/2013 2116   EOSABS 0.1 05/29/2013 2116   BASOSABS 0.0 05/29/2013 2116     Assessment: 1. Acute on CKD, UO good and Scr trending down 2. Gangrene L 2nd toe 3. Anemia Plan: 1. OK to go from my  standpoint.  I will arrange FU in our office with Dr Moshe Cipro and will plan to get FU labs on Mon.     Kayveon Lennartz T

## 2013-06-10 NOTE — Discharge Summary (Signed)
Physician Discharge Summary  Patient ID: [name] MRN: [id number] DOB/AGE: 1951/04/28 62 y.o.  Admit date: 05/29/2013 Discharge date: 06/10/2013  Primary Care Physician:  Hayden Rasmussen., MD  Discharge Diagnoses:   Gangrene left second toe  . Anemia of chronic disease . CAD (coronary artery disease), native coronary artery . acute on chronic CKD  . Peripheral arterial disease . Type II or unspecified type diabetes mellitus with renal manifestations, uncontrolled(250.42) . deconditioning  . Obesity, Class III, BMI 40-49.9 (morbid obesity) . Hyperlipidemia LDL goal <70 . Hypertension associated with diabetes  Consults:  Nephrology                   Interventional radiology                   Orthopedics, Dr. Sharol Given                    Cardiology, Dr. Gwenlyn Found  Recommendations for Outpatient Follow-up:  1. please check BMET in 1 week after hospital dc 2. patient needs appointment with Dr. Sharol Given in 2 weeks to assess in the left toe wound  Allergies:  No Known Allergies   Discharge Medications:   Medication List    STOP taking these medications       amLODipine 5 MG tablet  Commonly known as:  NORVASC     amoxicillin-clavulanate 875-125 MG per tablet  Commonly known as:  AUGMENTIN     gabapentin 100 MG capsule  Commonly known as:  NEURONTIN     losartan-hydrochlorothiazide 100-25 MG per tablet  Commonly known as:  HYZAAR      TAKE these medications       aspirin EC 81 MG tablet  Take 81 mg by mouth daily.     atorvastatin 40 MG tablet  Commonly known as:  LIPITOR  Take 1 tablet (40 mg total) by mouth daily at 6 PM.     carvedilol 12.5 MG tablet  Commonly known as:  COREG  Take 12.5 mg by mouth 2 (two) times daily with a meal.     doxycycline 100 MG tablet  Commonly known as:  VIBRA-TABS  Take 1 tablet (100 mg total) by mouth 2 (two) times daily. X 2 weeks     furosemide 40 MG tablet  Commonly known as:  LASIX  Take 1 tablet (40 mg total) by mouth 2 (two)  times daily.     insulin aspart 100 UNIT/ML injection  Commonly known as:  novoLOG  Inject 0-15 Units into the skin 3 (three) times daily with meals. Sliding scale insulin CBG 70 - 120: 0 units: CBG 121 - 150: 2 units; CBG 151 - 200: 3 units; CBG 201 - 250: 5 units; CBG 251 - 300: 8 units;CBG 301 - 350: 11 units; CBG 351 - 400: 15 units; CBG > 400 : 15 units and notify your doctor     insulin glargine 100 UNIT/ML injection  Commonly known as:  LANTUS  Inject 0.3 mLs (30 Units total) into the skin at bedtime.     omega-3 acid ethyl esters 1 G capsule  Commonly known as:  LOVAZA  Take 2 g by mouth daily.     silver sulfADIAZINE 1 % cream  Commonly known as:  SILVADENE  Apply 1 application topically daily. Apply to toe         Brief H and P: For complete details please refer to admission H and P, but in brief 62 yo female h/o  dm, pvd, cad presented with left toe infection for weeks. She had been on oral antibiotics and had recent abi showing moderate reduced flow to left lower extremity. She has had necrosis to this toe that has progressively gotten worse and now with some mild erythema. She has no pain due to peripheral neuropathy from her diabetes. She denied fevers. She has an appt with dr berry next week to look at her vascular studies and examine the foot.    Hospital Course:  62 yo female h/o dm, pvd, cad presented with left second toe wound for several weeks, failure to treatment with oral antibiotics. She had recent ABIs done which showed moderate reduced flow to left lower extremity. The wound had progressively worsened to the gangrene. Patient was admitted for further workup. Patient was also noticed to have renal failure on CK D. and renal consult was obtained.   AKI on CKD5 : Patient was admitted with antibiotics, usual outpatient meds were also continued including Hyzaar and Norvasc. During hospitalization she was noticed to have some hypotension in the 70s and high so it was  stopped creatinine continued to trend up to 7.23 on 06/04/2013. Renal service was consulted, renal ultrasound done was unremarkable and showed no hydronephrosis or obstruction. UA consistent with ATN but urine cultures showed no growth . She was placed on vancomycin and Zosyn for the foot wound. Interventional radiology was consulted and patient had a temporary HD catheter placed. She started on her first hemodialysis on 7/18. Patient was noticed to be somewhat lethargic and due to concern for uremia hemodialysis was initiated. Patient did not require any further hemodialysis and her creatinine has been running down nicely. Renal service plan to discontinue dialysis catheter 06/09/2013 and monitor creatinine function without the dialysis cath. The patient's antihypertensive medications were discontinued. Her blood pressure remained stable with systolic [id number].--Patient was instructed to follow up with her primary care provider for blood pressure check and future adjustment of her antihypertensive regimen  Anemia - transfuse when < 7 :  Transfused 2 units on 7/13, cont aranesp.   Type II or unspecified type diabetes mellitus with renal manifestations, uncontrolled-  Continue SSI, increase Lantus to 30units at bedtime -Patient and family were instructed to check the patient's sugars before each meal and at bedtime and to keep a glycemic log -Patient was instructed to take a glycemic log to her primary care provider who will adjust her future dosing of insulin CAD (coronary artery disease), native coronary artery- stable, no CP   Peripheral arterial disease with left 2nd toe gangrene, foul-smelling: Patient was placed on vanc and Zosyn per pharmacy. Patient was followed by cardiology, Dr. Alvester Chou and had no plans in the future for peripheral angiogram due to acute renal failure. Orthopedics was consulted, Dr. Sharol Given, recommended conservative management to see if it autoamputates. I discussed in detail with Dr.  Sharol Given and Dr. Megan Salon from ID today, recommended to discontinue vancomycin and Zosyn. Dr. Sharol Given requested the patient to be on doxycycline and followup in 2 weeks in the office. Dr. Sharol Given also discussed with the patient regarding surgical intervention if the wound does not heal.  H/o CVA : stable  Lethargy- d/c'd neurontin- improving, possibly due to uremia . Improved significantly, patient now alert and awake and oriented    generalized deconditioning: Will arrange home health PT, OT, RN followup   Physical Exam: [id number]/72 General: Alert and awake oriented x3 not in any acute distress. CVS: S1-S2 clear no murmur rubs or gallops Chest:  clear to auscultation bilaterally, no wheezing rales or rhonchi Abdomen: soft nontender, nondistended, normal bowel sounds Extremities: no cyanosis, clubbing Left 2nd toe dry gangrene--no lymphangitis, no odor, no pus     The results of significant diagnostics from this hospitalization (including imaging, microbiology, ancillary and laboratory) are listed below for reference.    LAB RESULTS: Basic Metabolic Panel:  Recent Labs Lab 06/09/13 0525 06/10/13 0600  NA 136 140  K 4.0 4.5  CL 102 103  CO2 23 25  GLUCOSE 252* 250*  BUN 68* 63*  CREATININE 3.21* 3.04*  CALCIUM 9.0 9.4  PHOS  --  4.7*   Liver Function Tests:  Recent Labs Lab 06/04/13 0600 06/10/13 0600  ALBUMIN 2.1* 2.1*   No results found for this basename: LIPASE, AMYLASE,  in the last 168 hours No results found for this basename: AMMONIA,  in the last 168 hours CBC:  Recent Labs Lab 06/07/13 0550 06/08/13 0746  WBC 9.6 9.6  HGB 7.4* 7.5*  HCT 23.1* 23.4*  MCV 85.6 86.0  PLT 263 279   Cardiac Enzymes: No results found for this basename: CKTOTAL, CKMB, CKMBINDEX, TROPONINI,  in the last 168 hours BNP: No components found with this basename: POCBNP,  CBG:  Recent Labs Lab 06/09/13 2155 06/10/13 0752  GLUCAP 295* 233*    Significant Diagnostic  Studies:  Ct Head Wo Contrast  05/30/2013   *RADIOLOGY REPORT*  Clinical Data: Left-sided weakness.  CT HEAD WITHOUT CONTRAST  Technique:  Contiguous axial images were obtained from the base of the skull through the vertex without contrast.  Comparison: 03/19/2013.  Findings: Large, old right cerebellar hemisphere infarct.  Old right occipital lobe infarct.  Patchy white matter low density in both cerebral hemispheres.  Minimally enlarged ventricles and subarachnoid spaces.  No intracranial hemorrhage, mass lesion or CT evidence of acute infarction.  Unremarkable bones and paranasal sinuses.  Stable left frontal probable venous lake.  IMPRESSION:  1.  No acute abnormality. 2.  Old infarcts, chronic small vessel white matter ischemic changes and minimal atrophy, as described above.   Original Report Authenticated By: Claudie Revering, M.D.   Dg Toe 2nd Left  05/30/2013   *RADIOLOGY REPORT*  Clinical Data: Wound infection of second digit left foot.  LEFT SECOND TOE  Comparison: None.  Findings: Diffuse vascular calcifications in the left foot.  No radiopaque foreign bodies or gas collections in the soft tissues. Bones appear intact.  No evidence of bone erosion or cortical irregularity to suggest osteomyelitis.  No acute fracture or subluxation.  Diffuse bone demineralization.  IMPRESSION: No acute bony abnormalities.  No evidence of osteomyelitis.   Original Report Authenticated By: Lucienne Capers, M.D.       Disposition and Follow-up: Discharge Orders   Future Appointments Provider Department Dept Phone   06/22/2013 10:45 AM Leonie Man, MD [facility] [phone]   06/23/2013 2:00 PM Philmore Pali, NP [facility] [phone]   Future Orders Complete By Expires     Diet Carb Modified  As directed     Discharge instructions  As directed     Comments:      Wound care:Left foot, second digit:  Apply dry dressing using single 2x2 inch gauze (opened)  to loosely wrap digit. Please folded 4x4 beneath toe and folded 4x4 over top of toes.  Wrap with kerlix roll gauze to secure.  Change daily.    Increase activity slowly  As directed  DISPOSITION: Home  DIET: Carb modified diet  TESTS THAT NEED FOLLOW-UP BMET in 1 week  DISCHARGE FOLLOW-UP Follow-up Information   Follow up with DUDA,MARCUS V, MD. Schedule an appointment as soon as possible for a visit in 2 weeks.   Contact information:   [place] [address] [phone]       Follow up with Horald Pollen L., MD. Schedule an appointment as soon as possible for a visit in 1 week. (you need renal function checked)    Contact information:   [place] [address] [phone]       Follow up with Leonie Man, MD On 06/22/2013. (At 10: 45 am)    Contact information:   [place] [address] [phone]       Time spent on Discharge: 35 mins  Signed:   Salisa Broz M.D. [facility] 06/10/2013, 12:03 PM Pager: [phone]

## 2013-06-18 ENCOUNTER — Ambulatory Visit: Payer: Medicare Other | Admitting: Cardiovascular Disease

## 2013-06-22 ENCOUNTER — Ambulatory Visit (INDEPENDENT_AMBULATORY_CARE_PROVIDER_SITE_OTHER): Payer: Medicare Other | Admitting: Cardiology

## 2013-06-22 ENCOUNTER — Encounter: Payer: Self-pay | Admitting: Cardiology

## 2013-06-22 VITALS — BP 122/78 | HR 68 | Ht 62.0 in | Wt 234.1 lb

## 2013-06-22 DIAGNOSIS — I998 Other disorder of circulatory system: Secondary | ICD-10-CM

## 2013-06-22 DIAGNOSIS — E1169 Type 2 diabetes mellitus with other specified complication: Secondary | ICD-10-CM

## 2013-06-22 DIAGNOSIS — I10 Essential (primary) hypertension: Secondary | ICD-10-CM

## 2013-06-22 DIAGNOSIS — I739 Peripheral vascular disease, unspecified: Secondary | ICD-10-CM

## 2013-06-22 DIAGNOSIS — L97509 Non-pressure chronic ulcer of other part of unspecified foot with unspecified severity: Secondary | ICD-10-CM

## 2013-06-22 DIAGNOSIS — E11621 Type 2 diabetes mellitus with foot ulcer: Secondary | ICD-10-CM

## 2013-06-22 DIAGNOSIS — I2510 Atherosclerotic heart disease of native coronary artery without angina pectoris: Secondary | ICD-10-CM

## 2013-06-22 DIAGNOSIS — I999 Unspecified disorder of circulatory system: Secondary | ICD-10-CM

## 2013-06-22 DIAGNOSIS — E785 Hyperlipidemia, unspecified: Secondary | ICD-10-CM

## 2013-06-22 NOTE — Assessment & Plan Note (Signed)
She actually seems to be relatively stable from a cardiac standpoint. She is on a stable dose of carvedilol as well as aspirin and atorvastatin. She is not on ACE inhibitor due to renal insufficiency.

## 2013-06-22 NOTE — Progress Notes (Signed)
Patient ID: [name], female   DOB: [birth year], 62 y.o.   MRN: [id number]  Clinic Note: HPI: [name] is a 62 y.o. 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 There hospitalization she was treated with IV antibiotics. She had Dopplers and ABIs done in the hospital as well as evaluation by Dr. Gwenlyn Found she pulses the left popliteal artery but diminished pulses in the feet bilaterally. She has not however had been located it was a more inferior because she somehow scheduled to [facility] for evaluation. We finally got the reports to the hospital. Her hospitalization was complicated by acute renal insufficiency with creatinine above 7. Because of this note was made to proceed with peripheral angiography with possible PTA --partly secondary to insufficiency as well as partly secondary to the predominantly infrapopliteal/tibial nature of her disease that is very difficult to have adequate percutaneous or operative interventional approach. The overall feeling was that dry gangrene of the digit would lead to auto-amputation.  She was seen by Dr. Sharol Given both in the hospital and now post hospital, he was considered a possibility of what was probably be BKA of the left leg. He is deferring to [place] for evaluation of her vascular condition.  Overall since her hospitalization, she has been, with the help of her daughter is present here today, doing daily dressing changes of what appears to be a close autoamputated toe with the smell of dry gangrene. Unfortunately she's also developed a lesion on sole of foot that has gone from being dark black in color in less than a for size the now to a silver dollar sized and almost pustular. She has dressing supplies and has a protective boot to wear.  She is currently on doxycycline for long-term treatment.  She is otherwise stable cardiac standpoint. She does continue to note mild dyspnea on exertion mostly due to sedentary life, obesity and upright pains. Otherwise she denies any chest pain with exertion, she denies any resting shortness of breath. No syncope or near-syncope. No lightheadedness, dizziness or wooziness other than occasionally in the morning when she stands up. No TIA or amaurosis fugax symptoms (since her stroke in April 2014).  Past Medical History  Diagnosis Date  . ST elevation myocardial infarction (STEMI) of inferior wall  03/2009    Ostial/proximal RCA occlusion treated with BMS stent  . CAD (coronary artery disease), native coronary artery      status post PCI to the RCA for inferior STEMI  . Cancer     endo ca  . Hypertension associated with diabetes   . Hyperlipidemia LDL goal <70   . Diabetes mellitus, type II, insulin dependent     With complications - CAD, CVA, peripheral ulcer ,  . Stroke/cerebrovascular accident  4/30/ 2014    Right-sided weakness, mostly with balance issues and only mild weakness.  . Diabetic peripheral neuropathy associated with type 2 diabetes mellitus   . Obesity, Class III, BMI 40-49.9 (morbid obesity)     BMI 43; 5' 2',  235 pounds 6.4 ounces  . CKD (chronic kidney disease), stage IV     Recent acute on chronic exacerbation in July 2014  . PAD (peripheral artery disease)   . Diabetic foot ulcer   . Dry gangrene     Left second toe;  now on Sole of L foot    Prior Cardiac Evaluation and Past Surgical History: Past Surgical History  Procedure Laterality Date  . Abdominal hysterectomy    . Leg tendon surgery      patient fell in a store right leg  . Leg surgery      hole in bone( during Childhood)  . Cardiac catheterization  03/31/2009    Proximal RCA thrombotic occlusion (inferior STEMI) other coronaries but in a codominant system  . Coronary angioplasty  03/31/2009    PTCA  , bare-metal stent 3.5 mm x 24 mm ostium of RCA ,EF 55%   No Known Allergies  Current Outpatient Prescriptions  Medication Sig Dispense Refill  . aspirin EC 81 MG tablet Take 81 mg by mouth daily.      Marland Kitchen atorvastatin (LIPITOR) 40 MG tablet Take 1 tablet (40 mg total) by mouth daily at 6 PM.      . carvedilol (COREG) 12.5 MG tablet Take 12.5 mg by mouth 2 (two) times daily with a meal.      . doxycycline (VIBRA-TABS) 100 MG tablet Take 1 tablet (100 mg total) by mouth 2 (two) times daily. X 2 weeks  28 tablet  0  . furosemide (LASIX) 40 MG tablet Take 1 tablet (40 mg total) by mouth 2 (two) times daily.  60 tablet  3  . glucose blood (CHOICE DM FORA G20 TEST STRIPS) test strip Use as instructed  100 each  12  . insulin aspart (NOVOLOG) 100 UNIT/ML injection Inject 0-15 Units into the skin 3 (three) times daily with meals. Sliding scale insulin CBG 70 - 120: 0 units: CBG 121 - 150: 2 units; CBG 151 - 200: 3 units; CBG 201 - 250: 5 units; CBG 251 - 300: 8 units;CBG 301 - 350: 11 units; CBG 351 - 400: 15 units; CBG > 400 : 15 units and notify your doctor  1 vial  12  . insulin glargine (LANTUS) 100 UNIT/ML injection Inject 0.3 mLs (30 Units total) into the skin at bedtime.  10 mL  12  . omega-3 acid ethyl esters (LOVAZA) 1 G capsule Take 1 g by mouth 3 (three) times daily.        No current facility-administered medications for this visit.    History   Social History  . Marital Status: Widowed    Spouse Name: N/A    Number of Children: N/A  . Years of Education: N/A   Occupational History  . Not on file.   Social History Main Topics  . Smoking status: Former Smoker    Quit date: 04/29/1995  . Smokeless tobacco: Never Used  . Alcohol Use: No  . Drug Use: No  . Sexually Active: No   Other Topics Concern  . Not on file   Social History Narrative   Was previously living in an assisted living center while recovering from her stroke. Lesion is now recently moved back home with her  family.    She is attended by her daughter today (not the same as I saw last visit,  but the one who was much more active during her recent hospitalization.    ROS: A comprehensive Review of Systems - Negative except Pertinent positives noted above. History obtained from Her today, which is more difficult, as she is not very forthcoming General ROS: positive for  - sleep disturbance negative for - chills, fatigue, fever, hot flashes or night sweats Musculoskeletal ROS: positive for - Arthritic pains in  hips, and knees Neurological ROS: positive for - gait disturbance, impaired coordination/balance -- exacerbated by her "lesion, numbness/tingling, weakness and Recent stroke. With improved symptoms. Intermittent numbness and tingling in her feet consistent with mild neuropathy. negative for - behavioral changes or headaches  PHYSICAL EXAM BP 122/78  Pulse 68  Ht 5\' 2"  (1.575 m)  Wt 234 lb 1.6 oz (106.187 kg)  BMI 42.81 kg/m2 General appearance: A&O x3, cooperative, but not very interactive. She appears stated age, no distress, morbidly obese and Pleasant. Very poor historian. Neck: no carotid bruit, no JVD, supple, symmetrical, trachea midline and thyroid not enlarged,  Lungs: CTAB, non-labored.  No W./R./R. good air movement. His distant sounds Heart: RRR with distant S1, S2; + S4 present, no click, no rub and No murmur; in general her heart sounds are very distant due to body habitus. Abdomen: normal findings: bowel sounds normal, liver span normal to percussion, no bruits heard, spleen non-palpable and Unable to palpate any hepatomegaly due to body habitus. and abnormal findings:  obese and Morbidly obese Extremities: edema Trace   On the soles/volar aspect of the left foot on middle segment, there is now a silver dollar dime-sized ulcer that has mild granular tissue. There is no pus. I the left second toe has dark eschar with significant overt gangrene. There is darkening along the  metatarsal margin of the dorsal aspect of the foot. She does have dressing in place, and is using a protective boot. Pulses: Popliteal pulses bilaterally, very faint DP and PT pulses on the left (this is confirmed by Doppler measures in the hospital)   [id number] today: No.  [place] Dopplers along with [facility] Dopplers are in the noninvasive ultrasound section of Epic -- unfortunately they're not very forthcoming with data his helpful in determining the location and extent and severity of her left tibial vessel disease.  ASSESSMENT: 62 year old, morbidly obese woman with multiple cardiac risk factors and history of inferior STEMI as well as stroke whose referred to me for a left toe wound. This is concerning for possible gangrenous foot.  The patient was actually seen by myself and Dr. Gwenlyn Found, who saw her in the hospital For Peripheral Vascular Consultation. In the hospital his assessment was likely dry gangrene of the toe that would likely autoamputate. Simply due to the patient's acute on chronic renal insufficiency there is no plan to perform peripheral vascular angiography.  She continues to have progressively worsening ulcer disease now on her foot in addition to her toe.  Diagnoses: Critical limb ischemia - See diabetic foot ulcer NTG low - Plan: Lower Extremity Arterial Doppler Bilateral  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  Peripheral arterial disease  CAD (coronary artery disease), native coronary artery  Hypertension associated with diabetes  Hyperlipidemia LDL goal <70  Obesity, Class III, BMI 40-49.9 (morbid obesity)  PLAN: Per problem list. 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 Now she is more than his left second toe of concern. The lesion on the volar aspect of her foot is more concerning, as this will not likely autoamputate. The toes he is very much be completely off. I will defer her debridement, antibiotic treatment etc. of the wound to Dr.  Sharol Given, Wound Care RN & her PCP.    I am happy to see that she has dressing in place, and her daughter seems to have a grasp on how to appropriately dress the wounds.  As on the case of diabetic ulcers/diabetic PVD, this  is quite often tibial disease and is not very amenable to peripheral vascular or surgical vascularization.  Peripheral arterial disease After discussing with Dr. Gwenlyn Found, he is quite insistent on obtaining lower extremity ABIs and Arterial Dopplers here t [facility] in order for him to have detailed data to determine if there is any potential revascularization options, or potentially less difficult tibial vessel options for revascularization.  Both. And myself had a frank discussion with the patient and her daughter's about the pros and cons/risks and benefits of peripheral angiography/intervention in light of the recent acute on chronic renal failure and potential for dialysis.  Basically, with her current level of renal dysfunction, peripheral angiography would not be recommended unless there is a: High likelihood of imminent HD, obvious focal culprit lesion noted on ultrasound, or the patient's willingness to accept the risk of dialysis as an option in potentially unsuccessful attempt to revascularized the foot to stave off amputation.  She is due to see her nephrologist soon, and we've recommended that she asked questions as he reviewed the potential for needing dialysis and recovery of renal function. It is our opinion, that at this point with her current renal function and risks of peripheral angiography and intervention are greater than the benefits unless she is likely to require full-time renal replacement therapy imminently. Even if angiography was performed, there is no guarantee of successful or even possible percutaneous revascularization option.  This would eventually lead to Dr. Sharol Given determine benefits of debridement etc of the volar wound  vs. Consideration of BKA.  CAD (coronary artery disease), native coronary artery She actually seems to be relatively stable from a cardiac standpoint. She is on a stable dose of carvedilol as well as aspirin and atorvastatin. She is not on ACE inhibitor due to renal insufficiency.  Hypertension associated with diabetes Well-controlled on her current regimen.  Hyperlipidemia LDL goal <70 On statin. Followed by primary care physician.  Obesity, Class III, BMI 40-49.9 (morbid obesity) Unfortunately, with her relative immobility, I do not foresee her doing any exercise in an attempt to lose weight.  She will require extensive dietary counseling.   Between myself and Dr. Gwenlyn Found, we spent close to 55 minutes with the patient in direct counseling and examination.  Followup: In roughly 3 to 4 weeks once we have the ultrasound results and she has had visits with Dr. Moshe Cipro and Dr. Sharol Given.  Leonie Man, M.D., M.S. [facility] [address] [place], [place]  [id number]  [phone] Pager # [phone] 06/23/2013 12:03 AM

## 2013-06-22 NOTE — Assessment & Plan Note (Signed)
Now she is more than his left second toe of concern. The lesion on the volar aspect of her foot is more concerning, as this will not likely autoamputate. The toes he is very much be completely off. I will defer her debridement, antibiotic treatment etc. of the wound to Dr. Sharol Given, Wound Care RN & her PCP.    I am happy to see that she has dressing in place, and her daughter seems to have a grasp on how to appropriately dress the wounds.  As on the case of diabetic ulcers/diabetic PVD, this is quite often tibial disease and is not very amenable to peripheral vascular or surgical vascularization.

## 2013-06-22 NOTE — Assessment & Plan Note (Signed)
After discussing with Dr. Gwenlyn Found, he is quite insistent on obtaining lower extremity ABIs and Arterial Dopplers here t [facility] in order for him to have detailed data to determine if there is any potential revascularization options, or potentially less difficult tibial vessel options for revascularization.  Both. And myself had a frank discussion with the patient and her daughter's about the pros and cons/risks and benefits of peripheral angiography/intervention in light of the recent acute on chronic renal failure and potential for dialysis.  Basically, with her current level of renal dysfunction, peripheral angiography would not be recommended unless there is a: High likelihood of imminent HD, obvious focal culprit lesion noted on ultrasound, or the patient's willingness to accept the risk of dialysis as an option in potentially unsuccessful attempt to revascularized the foot to stave off amputation.  She is due to see her nephrologist soon, and we've recommended that she asked questions as he reviewed the potential for needing dialysis and recovery of renal function. It is our opinion, that at this point with her current renal function and risks of peripheral angiography and intervention are greater than the benefits unless she is likely to require full-time renal replacement therapy imminently. Even if angiography was performed, there is no guarantee of successful or even possible percutaneous revascularization option.  This would eventually lead to Dr. Sharol Given determine benefits of debridement etc of the volar wound vs. Consideration of BKA.

## 2013-06-22 NOTE — Patient Instructions (Addendum)
Your physician recommends that you schedule a follow-up appointment in: 4-6 weeks  Your physician has requested that you have a lower or upper extremity arterial duplex. This test is an ultrasound of the arteries in the legs or arms. It looks at arterial blood flow in the legs and arms. Allow one hour for Lower and Upper Arterial scans. There are no restrictions or special instructions

## 2013-06-23 ENCOUNTER — Ambulatory Visit: Payer: Medicare Other | Admitting: Nurse Practitioner

## 2013-06-23 NOTE — Assessment & Plan Note (Signed)
On statin. Followed by primary care physician.

## 2013-06-23 NOTE — Assessment & Plan Note (Signed)
Unfortunately, with her relative immobility, I do not foresee her doing any exercise in an attempt to lose weight.  She will require extensive dietary counseling.

## 2013-06-23 NOTE — Assessment & Plan Note (Signed)
Well-controlled on her current regimen.

## 2013-06-25 ENCOUNTER — Telehealth: Payer: Self-pay | Admitting: Cardiology

## 2013-06-25 NOTE — Telephone Encounter (Signed)
Call from Joie Bimler w/ [facility] called.  Stated the blister on the bottom of pt's foot that was white when Dr. Ellyn Hack saw pt is now black.  Stated it is gangrenous.  Informed Dr. Ellyn Hack will be notified.  Message forwarded to Dr. Ellyn Hack.

## 2013-06-25 NOTE — Telephone Encounter (Signed)
Please call her back. Urgent!

## 2013-06-25 NOTE — Telephone Encounter (Signed)
I do not doubt that it is becoming gangrenous.  At this  point, they should be calling Dr.Duda's office to be seen sooner than later to see if it needs to be debrided, or left alone.    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 Sharol Given do.   As for peripheral artery evaluation & possible treatmen goes, the same concerns remain as were present on 8/4 in the office.    She should have a CBC & BMP checked to look for elevated WBC & see how her renal function is doing, but they need to contact Dr. Jess Barters office or her PCP.  Also, I will be on vacation starting tomorrow AM.  Please forward ?s about her care to Dr. Gwenlyn Found who is also familiar with her.  HARDING,DAVID W

## 2013-06-26 NOTE — Telephone Encounter (Signed)
Call to Joie Bimler w/ [facility] and informed per Dr. Ellyn Hack to contact Dr. Sharol Given or PCP.  Verbalized understanding.

## 2013-06-29 ENCOUNTER — Ambulatory Visit (HOSPITAL_COMMUNITY)
Admission: RE | Admit: 2013-06-29 | Discharge: 2013-06-29 | Disposition: A | Discharge status: Home / Self Care | Payer: Medicare Other | Source: Ambulatory Visit | Attending: Cardiology | Admitting: Cardiology

## 2013-06-29 DIAGNOSIS — I70219 Atherosclerosis of native arteries of extremities with intermittent claudication, unspecified extremity: Secondary | ICD-10-CM

## 2013-06-29 DIAGNOSIS — I998 Other disorder of circulatory system: Secondary | ICD-10-CM

## 2013-06-29 DIAGNOSIS — L97509 Non-pressure chronic ulcer of other part of unspecified foot with unspecified severity: Secondary | ICD-10-CM

## 2013-06-29 DIAGNOSIS — I739 Peripheral vascular disease, unspecified: Secondary | ICD-10-CM

## 2013-06-29 NOTE — Progress Notes (Signed)
Arterial Duplex Lower Ext. Completed.  Occluded left popliteal and tibial trunk. Preliminary by tech. Oda Cogan, BS, RDMS, RVT

## 2013-07-09 ENCOUNTER — Telehealth: Payer: Self-pay | Admitting: *Deleted

## 2013-07-09 NOTE — Telephone Encounter (Signed)
Patient has an appointment on 07/10/2013 scheduled

## 2013-07-09 NOTE — Telephone Encounter (Signed)
Message copied by Raiford Simmonds on Thu Jul 09, 2013  5:12 PM ------      Message from: [facility], DAVID      Created: Sun Jul 05, 2013  2:28 PM       Her LEA Dopplers were done & read by JB - she needs ROV appt with him to discuss options.            Leonie Man, MD       ------

## 2013-07-10 ENCOUNTER — Ambulatory Visit (INDEPENDENT_AMBULATORY_CARE_PROVIDER_SITE_OTHER): Payer: Medicare Other | Admitting: Cardiovascular Disease

## 2013-07-10 ENCOUNTER — Encounter: Payer: Self-pay | Admitting: Cardiovascular Disease

## 2013-07-10 VITALS — BP 140/60 | HR 84 | Ht 62.0 in | Wt 223.0 lb

## 2013-07-10 DIAGNOSIS — E1169 Type 2 diabetes mellitus with other specified complication: Secondary | ICD-10-CM

## 2013-07-10 DIAGNOSIS — L97509 Non-pressure chronic ulcer of other part of unspecified foot with unspecified severity: Secondary | ICD-10-CM

## 2013-07-10 DIAGNOSIS — E11621 Type 2 diabetes mellitus with foot ulcer: Secondary | ICD-10-CM

## 2013-07-10 NOTE — Patient Instructions (Addendum)
Your physician wants you to follow-up in: 3 months with Dr Berry. You will receive a reminder letter in the mail two months in advance. If you don't receive a letter, please call our office to schedule the follow-up appointment.  

## 2013-07-10 NOTE — Assessment & Plan Note (Signed)
I have seen the patient during her recent hospitalization and again during her visit with Dr. Ellyn Hack. 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 She also has chronic renal insufficiency and sees Dr. Joanne Chars for this. She also sees Dr. Meridee Score, orthopedic surgeon.she has a creatinine that runs in the low to mid 3 range. Her lower shimmy Doppler studies performed in our office on 06/29/13 revealed a left ABI of 0.74 with an occluded left popliteal artery. At this point I do not see surrounding cellulitis or evidence of osteomyelitis or infection. The gangrenous process is limited to the left second toe is painful. Performing and Phillip Heal would undoubtedly lead to acute on chronic renal insufficiency probably necessitating hemodialysis. At this point I do not think that is a risk that we need to take but potentially in the future for limb salvage he may be required. I will see her back in 3 months.

## 2013-07-10 NOTE — Progress Notes (Signed)
07/10/2013 [name]   [birth year]  HH:[id number]  Primary Physician Hayden Rasmussen., MD Primary Cardiologist: Lorretta Harp MD Renae Gloss   HPI:  [name] returns for followup of her left second toe gangrene. She sees Dr. Glenetta Hew for her cardiology issues, Dr. Vanetta Mulders her nephrologist and Dr. Meridee Score, her orthopedic surgeon. She has hypertension, diabetes, hyperlipidemia and chronic renal insufficiency. She has a gangrenous left second toe. Lower Jimi Dopplers performed in our office 06/29/13 revealed an occluded left popliteal artery with an ABI of 0.74. Her left toe is not painful. There is no obvious cellulitis or osteomyelitis. It has not gotten progressively worse over the recent past. At this point I prefer to continue to watch this rather than expose her to review contrast and potentially contribute to acute on chronic renal failure or dialysis. Certainly, if she was in imminent danger of losing her leg we would reassess that risk-benefit ratio.   Current Outpatient Prescriptions  Medication Sig Dispense Refill  . aspirin EC 81 MG tablet Take 81 mg by mouth daily.      Marland Kitchen atorvastatin (LIPITOR) 40 MG tablet Take 1 tablet (40 mg total) by mouth daily at 6 PM.      . carvedilol (COREG) 12.5 MG tablet Take 25 mg by mouth 2 (two) times daily with a meal.       . furosemide (LASIX) 40 MG tablet Take 1 tablet (40 mg total) by mouth 2 (two) times daily.  60 tablet  3  . glucose blood (CHOICE DM FORA G20 TEST STRIPS) test strip Use as instructed  100 each  12  . insulin aspart (NOVOLOG) 100 UNIT/ML injection Inject 0-15 Units into the skin 3 (three) times daily with meals. Sliding scale insulin CBG 70 - 120: 0 units: CBG 121 - 150: 2 units; CBG 151 - 200: 3 units; CBG 201 - 250: 5 units; CBG 251 - 300: 8 units;CBG 301 - 350: 11 units; CBG 351 - 400: 15 units; CBG > 400 : 15 units and notify your doctor  1 vial  12  . insulin glargine (LANTUS) 100 UNIT/ML  injection Inject 0.3 mLs (30 Units total) into the skin at bedtime.  10 mL  12  . omega-3 acid ethyl esters (LOVAZA) 1 G capsule Take 1 g by mouth 3 (three) times daily.        No current facility-administered medications for this visit.    No Known Allergies  History   Social History  . Marital Status: Widowed    Spouse Name: N/A    Number of Children: N/A  . Years of Education: N/A   Occupational History  . Not on file.   Social History Main Topics  . Smoking status: Former Smoker    Quit date: 04/29/1995  . Smokeless tobacco: Never Used  . Alcohol Use: No  . Drug Use: No  . Sexual Activity: No   Other Topics Concern  . Not on file   Social History Narrative   Was previously living in an assisted living center while recovering from her stroke. Lesion is now recently moved back home with her family.    She is attended by her daughter, who is super morbidly obese.              Review of Systems: General: negative for chills, fever, night sweats or weight changes.  Cardiovascular: negative for chest pain, dyspnea on exertion, edema, orthopnea, palpitations, paroxysmal nocturnal dyspnea or shortness of breath  Dermatological: negative for rash Respiratory: negative for cough or wheezing Urologic: negative for hematuria Abdominal: negative for nausea, vomiting, diarrhea, bright red blood per rectum, melena, or hematemesis Neurologic: negative for visual changes, syncope, or dizziness All other systems reviewed and are otherwise negative except as noted above.    Blood pressure 140/60, pulse 84, height 5\' 2"  (1.575 m), weight 223 lb (101.152 kg).  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 Ellyn Hack. 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 She also has chronic renal insufficiency and sees Dr. Joanne Chars for this. She also sees Dr. Meridee Score, orthopedic surgeon.she has a creatinine that runs in the low to mid 3 range. Her lower shimmy Doppler studies performed in our office on 06/29/13 revealed a left ABI of 0.74 with an occluded left popliteal artery. At this point I do not see surrounding cellulitis or evidence of osteomyelitis or infection. The gangrenous process is limited to the left second toe is painful. Performing and Phillip Heal would undoubtedly lead to acute on chronic renal insufficiency probably necessitating hemodialysis. At this point I do not think that is a risk that we need to take but potentially in the future for limb salvage he may be required. I will see her back in 3 months.      Lorretta Harp MD FACP,FACC,FAHA, [facility] 07/10/2013 12:11 PM

## 2013-07-14 ENCOUNTER — Encounter (HOSPITAL_COMMUNITY): Payer: Medicare Other

## 2013-07-21 ENCOUNTER — Ambulatory Visit: Payer: Medicare Other | Admitting: Cardiology

## 2013-07-23 ENCOUNTER — Encounter: Payer: Self-pay | Admitting: Nurse Practitioner

## 2013-07-23 ENCOUNTER — Ambulatory Visit (INDEPENDENT_AMBULATORY_CARE_PROVIDER_SITE_OTHER): Payer: Medicare Other | Admitting: Nurse Practitioner

## 2013-07-23 VITALS — BP 110/54 | HR 74 | Temp 98.2°F | Ht 63.0 in | Wt 225.0 lb

## 2013-07-23 DIAGNOSIS — I639 Cerebral infarction, unspecified: Secondary | ICD-10-CM

## 2013-07-23 DIAGNOSIS — L97509 Non-pressure chronic ulcer of other part of unspecified foot with unspecified severity: Secondary | ICD-10-CM

## 2013-07-23 DIAGNOSIS — R739 Hyperglycemia, unspecified: Secondary | ICD-10-CM

## 2013-07-23 DIAGNOSIS — N189 Chronic kidney disease, unspecified: Secondary | ICD-10-CM

## 2013-07-23 DIAGNOSIS — E785 Hyperlipidemia, unspecified: Secondary | ICD-10-CM

## 2013-07-23 DIAGNOSIS — I10 Essential (primary) hypertension: Secondary | ICD-10-CM

## 2013-07-23 DIAGNOSIS — I6350 Cerebral infarction due to unspecified occlusion or stenosis of unspecified cerebral artery: Secondary | ICD-10-CM

## 2013-07-23 DIAGNOSIS — E11621 Type 2 diabetes mellitus with foot ulcer: Secondary | ICD-10-CM

## 2013-07-23 DIAGNOSIS — R7309 Other abnormal glucose: Secondary | ICD-10-CM

## 2013-07-23 DIAGNOSIS — E1169 Type 2 diabetes mellitus with other specified complication: Secondary | ICD-10-CM

## 2013-07-23 DIAGNOSIS — E119 Type 2 diabetes mellitus without complications: Secondary | ICD-10-CM

## 2013-07-23 MED ORDER — ASPIRIN 325 MG PO TBEC: 81.0000 mg | DELAYED_RELEASE_TABLET | Freq: Every day | ORAL | Status: DC

## 2013-07-23 NOTE — Patient Instructions (Addendum)
Continue aspirin 325 mg orally every day for secondary stroke prevention. Ongoing risk factor modification by Primary Care Physician  Goal HgbA1c < 7  Followup in 3 months.

## 2013-07-23 NOTE — Progress Notes (Signed)
[facility]  PATIENT: Tyneisha Fico DOB: Apr 10, 1951   HISTORY FROM: patient, chart REASON FOR VISIT: routine follow up  HISTORY OF PRESENT ILLNESS:  Harmonee Krekeler is an 62 y.o. female history of hypertension, hyperlipidemia, diabetes mellitus, chronic kidney disease and systolic and diastolic heart failure, who was brought to [facility] emergency room following acute onset of headache dizziness and nausea. She describes the dizziness as turning to falling type sensation. There is no previous history of stroke nor TIA. She has not been on antiplatelet therapy. CT scan of her head was unremarkable. 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 Subacute on chronic appearing right PCA and right MCA white matter infarcts were also noted. 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 NIH stroke score was 0  UPDATE 07/23/13 (LL): Patient comes to office for first office visit post-stroke.  Since discharge, she had a July 11 admission for gangrenous toe.  Dr. Gwenlyn Found and Dr. Sharol Given are following her for this condition.  She needed acute hemodialysis for acute on chronic renal failure while hospitalized.  Her blood sugars during that time were 350-500.  Since discharge she and her daughters have gone through diabetic counseling and nutritional counseling and are trying to get blood sugars under control.  Daughter states that her mother is on Lantus and SSI and blood sugars are still in the 200 range.  They are trying to help their mother with healthy meals.  The diabetic foot ulcer is gangrenous, there is a left lower arterial blockage.  The foot is not painful. A watchful waiting approach is being taken, due to renal failure.  For stroke prevention, patient has been taking a baby aspirin daily.  Patient denies medication side effects, with no signs of bleeding or excessive bruising.  Patient  complains of paresthesias and burning sensation in hands.  REVIEW OF SYSTEMS: Full 14 system review of systems performed and notable only for: constitutional: N/A  cardiovascular: N/A respiratory: N/A endocrine: N/A  ear/nose/throat: hearing loss, ringing in ears  Hematology/Lymph: easy bleeding, easy bruising musculoskeletal: N/A skin: N/A genitourinary: N/A Gastrointestinal: constipation allergy/immunology: N/A neurological: memory loss, confusion, numbness, weakness sleep: N/A psychiatric: too much sleep, disinterest in acitivites   ALLERGIES: No Known Allergies  HOME MEDICATIONS: Outpatient Prescriptions Prior to Visit  Medication Sig Dispense Refill  . aspirin EC 81 MG tablet Take 81 mg by mouth daily.      Marland Kitchen atorvastatin (LIPITOR) 40 MG tablet Take 1 tablet (40 mg total) by mouth daily at 6 PM.      . carvedilol (COREG) 12.5 MG tablet Take 25 mg by mouth 2 (two) times daily with a meal.       . furosemide (LASIX) 40 MG tablet Take 1 tablet (40 mg total) by mouth 2 (two) times daily.  60 tablet  3  . glucose blood (CHOICE DM FORA G20 TEST STRIPS) test strip Use as instructed  100 each  12  . insulin aspart (NOVOLOG) 100 UNIT/ML injection Inject 0-15 Units into the skin 3 (three) times daily with meals. Sliding scale insulin CBG 70 - 120: 0 units: CBG 121 - 150: 2 units; CBG 151 - 200: 3 units; CBG 201 - 250: 5 units; CBG 251 - 300: 8 units;CBG 301 - 350: 11 units; CBG 351 - 400: 15 units; CBG > 400 : 15 units and notify your doctor  1 vial  12  . insulin  glargine (LANTUS) 100 UNIT/ML injection Inject 0.3 mLs (30 Units total) into the skin at bedtime.  10 mL  12  . omega-3 acid ethyl esters (LOVAZA) 1 G capsule Take 1 g by mouth 3 (three) times daily.        No facility-administered medications prior to visit.    PAST MEDICAL HISTORY: Past Medical History  Diagnosis Date  . ST elevation myocardial infarction (STEMI) of inferior wall  03/2009    Ostial/proximal RCA  occlusion treated with BMS stent  . CAD (coronary artery disease), native coronary artery      status post PCI to the RCA for inferior STEMI  . Cancer     endo ca  . Hypertension associated with diabetes   . Hyperlipidemia LDL goal <70   . Diabetes mellitus, type II, insulin dependent     With complications - CAD, CVA, peripheral ulcer ,  . Stroke/cerebrovascular accident  4/30/ 2014    Right-sided weakness, mostly with balance issues and only mild weakness.  . Diabetic peripheral neuropathy associated with type 2 diabetes mellitus   . Obesity, Class III, BMI 40-49.9 (morbid obesity)     BMI 43; 5' 2',  235 pounds 6.4 ounces  . CKD (chronic kidney disease), stage IV     Recent acute on chronic exacerbation in July 2014  . PAD (peripheral artery disease)   . Diabetic foot ulcer   . Dry gangrene     Left second toe; now on Sole of L foot    PAST SURGICAL HISTORY: Past Surgical History  Procedure Laterality Date  . Abdominal hysterectomy    . Leg tendon surgery      patient fell in a store right leg  . Leg surgery      hole in bone( during Childhood)  . Cardiac catheterization  03/31/2009    Proximal RCA thrombotic occlusion (inferior STEMI) other coronaries but in a codominant system  . Coronary angioplasty  03/31/2009    PTCA , bare-metal stent 3.5 mm x 24 mm ostium of RCA ,EF 55%    FAMILY HISTORY: Family History  Problem Relation Age of Onset  . Alcoholism Mother     Died from complications  . Alcoholism Father     Died from complications  . Heart disease      Unknown, unclear. Patient is not a good historian.  Essentially no pertinent history known    SOCIAL HISTORY: History   Social History  . Marital Status: Widowed    Spouse Name: N/A    Number of Children: 4  . Years of Education: N/A   Occupational History  .     Social History Main Topics  . Smoking status: Former Smoker    Quit date: 04/29/1995  . Smokeless tobacco: Never Used  . Alcohol Use: No   . Drug Use: No  . Sexual Activity: No   Other Topics Concern  . Not on file   Social History Narrative   Was previously living in an assisted living center while recovering from her stroke. Lesion is now recently moved back home with her family.    She is attended by her daughter, who is super morbidly obese.              PHYSICAL EXAM  Filed Vitals:   07/23/13 1131  BP: 110/54  Pulse: 74  Temp: 98.2 F (36.8 C)  Height: 5\' 3"  (1.6 m)  Weight: 225 lb (102.059 kg)   Body mass index is 39.87  kg/(m^2).  General: Pleasant, obese AA female Lungs: CTA Cardiac: RRR, 2/6 systolic murmur Musculoskeletal: diabetic foot ulcer, 2nd toe on left foot, wearing orthotic boot Neurologic Examination:  Mental Status:  Awake, alert, thought content appropriate. Speech fluent without evidence of aphasia. Able to follow commands without difficulty.  Cranial Nerves:  II-Visual fields were normal.  III/IV/VI-Pupils were equal and reacted. Extraocular movements were full and conjugate. No nystagmus  V/VII-no facial numbness and no facial weakness.  VIII-normal.  Anselmo Pickler was slightly slurred but consistent with being edentulous; symmetrical palatal movement.  Motor: 5/5 bilaterally with normal tone and bulk  Sensory: Normal throughout.  Deep Tendon Reflexes: Trace to 1+ and symmetric.  Cerebellar: Normal finger-to-nose testing.  Carotid auscultation: Normal    DIAGNOSTIC DATA (LABS, IMAGING, TESTING) - I reviewed patient records, labs, notes, testing and imaging myself where available.  Lab Results  Component Value Date   WBC 9.6 06/08/2013   HGB 7.5* 06/08/2013   HCT 23.4* 06/08/2013   MCV 86.0 06/08/2013   PLT 279 06/08/2013      Component Value Date/Time   NA 140 06/10/2013 0600   K 4.5 06/10/2013 0600   CL 103 06/10/2013 0600   CO2 25 06/10/2013 0600   GLUCOSE 250* 06/10/2013 0600   BUN 63* 06/10/2013 0600   CREATININE 3.04* 06/10/2013 0600   CALCIUM 9.4 06/10/2013 0600   PROT  7.2 03/19/2013 0420   ALBUMIN 2.1* 06/10/2013 0600   AST 10 03/19/2013 0420   ALT 7 03/19/2013 0420   ALKPHOS 105 03/19/2013 0420   BILITOT 0.3 03/19/2013 0420   GFRNONAA 16* 06/10/2013 0600   GFRAA 18* 06/10/2013 0600   Lab Results  Component Value Date   CHOL 187 03/19/2013   HDL 36* 03/19/2013   LDLCALC 95 03/19/2013   TRIG 282* 03/19/2013   CHOLHDL 5.2 03/19/2013   Lab Results  Component Value Date   HGBA1C 10.1* 05/30/2013   No results found for this basename: VITAMINB12    Dg Chest 2 View  03/19/2013 Cardiac enlargement with pulmonary vascular congestion.  Ct Head Wo Contrast  03/18/2013 No intracranial hemorrhage. 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 Remote tiny mid right corona radiata infarct. Small vessel disease type changes. No CT evidence of large acute infarct.  Mr Angiogram Head Wo Contrast  03/18/2013: 1. 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 Early cytotoxic edema. No associated hemorrhage or significant mass effect at this time. 2. Subacute on chronic appearing right PCA and right MCA white matter infarcts. Underlying chronic right occipital infarct with encephalomalacia corresponding to the earlier CT finding.  MRA HEAD WITHOUT CONTRAST  03/18/2013 1. 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 The distal left vertebral artery likely was nondominant. 2. Otherwise negative posterior circulation. 3. Mild anterior circulation atherosclerosis. Nondominant versus atherosclerotic left ACA A1 segment.  2D Echocardiogram Left ventricle: The cavity size was normal. Wall thickness was increased in a pattern of mild LVH. There was focal basal hypertrophy. Systolic function was vigorous. The estimated ejection fraction was in the range of 65% to 70%. Wall motion was normal; there were no regional wall motion abnormalities.- Left atrium: The atrium was mildly dilated.- Right atrium: The atrium was mildly dilated  Carotid Doppler Bilaterally <40% internal  carotid artery stenosis  ASSESSMENT AND PLAN Ms. Ahlexis Debruhl is a 62 y.o. female presenting with headache, dizziness and nausea. Imaging confirms a large right PICA cerebellar infarct with cytotoxic edema. Infarct felt to be thrombotic. On no anticoagulants  prior to admission.  Diabetes mellitus with neuropathy, uncontrolled, type 2, HgbA1c 10.8  Hyperlipidemia, LDal LDL 95, on pravachol 80mg  PTA, switched to Lipitor 40mg  daily, goal LDL < 70 in diabetes  Hypertension  Chronic kidney disease,-stage 3, baselin Cr 1.5   TREATMENT/PLAN  Change to  aspirin 325 mg orally every day  for secondary stroke prevention and maintain strict control of hypertension with blood pressure goal below 130/90, diabetes with hemoglobin A1c goal below 6.5% and lipids with LDL cholesterol goal below 100 mg/dL.  Ongoing risk factor modification by Primary Care Physician Goal HgbA1c < 7 Followup in 3 months.  Wyllow Seigler NP-C 07/23/2013, 11:46 AM  [facility] [address] [place], [place] [address] [phone]

## 2013-08-06 ENCOUNTER — Inpatient Hospital Stay (HOSPITAL_COMMUNITY)
Admission: EM | Admit: 2013-08-06 | Discharge: 2013-08-14 | DRG: 240 | Disposition: A | Discharge status: Skilled Nursing Facility | Payer: Medicare Other | Attending: Internal Medicine | Admitting: Internal Medicine

## 2013-08-06 ENCOUNTER — Encounter (HOSPITAL_COMMUNITY): Payer: Self-pay

## 2013-08-06 ENCOUNTER — Emergency Department (HOSPITAL_COMMUNITY): Payer: Medicare Other

## 2013-08-06 ENCOUNTER — Ambulatory Visit: Payer: Medicare Other | Admitting: Cardiology

## 2013-08-06 DIAGNOSIS — N185 Chronic kidney disease, stage 5: Secondary | ICD-10-CM | POA: Diagnosis present

## 2013-08-06 DIAGNOSIS — E871 Hypo-osmolality and hyponatremia: Secondary | ICD-10-CM | POA: Diagnosis present

## 2013-08-06 DIAGNOSIS — L089 Local infection of the skin and subcutaneous tissue, unspecified: Secondary | ICD-10-CM

## 2013-08-06 DIAGNOSIS — R51 Headache: Secondary | ICD-10-CM

## 2013-08-06 DIAGNOSIS — L97909 Non-pressure chronic ulcer of unspecified part of unspecified lower leg with unspecified severity: Secondary | ICD-10-CM

## 2013-08-06 DIAGNOSIS — I96 Gangrene, not elsewhere classified: Secondary | ICD-10-CM | POA: Diagnosis present

## 2013-08-06 DIAGNOSIS — E1142 Type 2 diabetes mellitus with diabetic polyneuropathy: Secondary | ICD-10-CM | POA: Diagnosis present

## 2013-08-06 DIAGNOSIS — D62 Acute posthemorrhagic anemia: Secondary | ICD-10-CM | POA: Diagnosis not present

## 2013-08-06 DIAGNOSIS — E1169 Type 2 diabetes mellitus with other specified complication: Secondary | ICD-10-CM

## 2013-08-06 DIAGNOSIS — I2510 Atherosclerotic heart disease of native coronary artery without angina pectoris: Secondary | ICD-10-CM | POA: Diagnosis present

## 2013-08-06 DIAGNOSIS — N058 Unspecified nephritic syndrome with other morphologic changes: Secondary | ICD-10-CM | POA: Diagnosis present

## 2013-08-06 DIAGNOSIS — G547 Phantom limb syndrome without pain: Secondary | ICD-10-CM | POA: Diagnosis present

## 2013-08-06 DIAGNOSIS — I639 Cerebral infarction, unspecified: Secondary | ICD-10-CM

## 2013-08-06 DIAGNOSIS — E1149 Type 2 diabetes mellitus with other diabetic neurological complication: Secondary | ICD-10-CM | POA: Diagnosis present

## 2013-08-06 DIAGNOSIS — K5900 Constipation, unspecified: Secondary | ICD-10-CM | POA: Diagnosis not present

## 2013-08-06 DIAGNOSIS — E1129 Type 2 diabetes mellitus with other diabetic kidney complication: Secondary | ICD-10-CM | POA: Diagnosis present

## 2013-08-06 DIAGNOSIS — E11622 Type 2 diabetes mellitus with other skin ulcer: Secondary | ICD-10-CM

## 2013-08-06 DIAGNOSIS — I959 Hypotension, unspecified: Secondary | ICD-10-CM | POA: Diagnosis not present

## 2013-08-06 DIAGNOSIS — E872 Acidosis, unspecified: Secondary | ICD-10-CM | POA: Diagnosis not present

## 2013-08-06 DIAGNOSIS — E1159 Type 2 diabetes mellitus with other circulatory complications: Principal | ICD-10-CM | POA: Diagnosis present

## 2013-08-06 DIAGNOSIS — I739 Peripheral vascular disease, unspecified: Secondary | ICD-10-CM | POA: Diagnosis present

## 2013-08-06 DIAGNOSIS — E785 Hyperlipidemia, unspecified: Secondary | ICD-10-CM | POA: Diagnosis present

## 2013-08-06 DIAGNOSIS — I509 Heart failure, unspecified: Secondary | ICD-10-CM | POA: Diagnosis present

## 2013-08-06 DIAGNOSIS — E11621 Type 2 diabetes mellitus with foot ulcer: Secondary | ICD-10-CM

## 2013-08-06 DIAGNOSIS — D638 Anemia in other chronic diseases classified elsewhere: Secondary | ICD-10-CM | POA: Diagnosis present

## 2013-08-06 DIAGNOSIS — L97509 Non-pressure chronic ulcer of other part of unspecified foot with unspecified severity: Secondary | ICD-10-CM | POA: Diagnosis present

## 2013-08-06 DIAGNOSIS — N179 Acute kidney failure, unspecified: Secondary | ICD-10-CM | POA: Diagnosis not present

## 2013-08-06 DIAGNOSIS — Z8673 Personal history of transient ischemic attack (TIA), and cerebral infarction without residual deficits: Secondary | ICD-10-CM

## 2013-08-06 DIAGNOSIS — E1165 Type 2 diabetes mellitus with hyperglycemia: Secondary | ICD-10-CM | POA: Diagnosis present

## 2013-08-06 DIAGNOSIS — E1101 Type 2 diabetes mellitus with hyperosmolarity with coma: Secondary | ICD-10-CM

## 2013-08-06 DIAGNOSIS — L97409 Non-pressure chronic ulcer of unspecified heel and midfoot with unspecified severity: Secondary | ICD-10-CM | POA: Diagnosis present

## 2013-08-06 DIAGNOSIS — I150 Renovascular hypertension: Secondary | ICD-10-CM

## 2013-08-06 DIAGNOSIS — I120 Hypertensive chronic kidney disease with stage 5 chronic kidney disease or end stage renal disease: Secondary | ICD-10-CM | POA: Diagnosis present

## 2013-08-06 DIAGNOSIS — R112 Nausea with vomiting, unspecified: Secondary | ICD-10-CM

## 2013-08-06 DIAGNOSIS — I252 Old myocardial infarction: Secondary | ICD-10-CM

## 2013-08-06 DIAGNOSIS — N184 Chronic kidney disease, stage 4 (severe): Secondary | ICD-10-CM | POA: Diagnosis present

## 2013-08-06 LAB — COMPREHENSIVE METABOLIC PANEL
Alkaline Phosphatase: 125 U/L — ABNORMAL HIGH (ref 39–117)
BUN: 84 mg/dL — ABNORMAL HIGH (ref 6–23)
CO2: 23 mEq/L (ref 19–32)
Chloride: 96 mEq/L (ref 96–112)
Creatinine, Ser: 3.14 mg/dL — ABNORMAL HIGH (ref 0.50–1.10)
GFR calc Af Amer: 17 mL/min — ABNORMAL LOW (ref >90)
GFR calc non Af Amer: 15 mL/min — ABNORMAL LOW (ref >90)
Glucose, Bld: 171 mg/dL — ABNORMAL HIGH (ref 70–99)
Potassium: 3.8 mEq/L (ref 3.5–5.1)
Total Bilirubin: 0.4 mg/dL (ref 0.3–1.2)

## 2013-08-06 LAB — URINALYSIS, ROUTINE W REFLEX MICROSCOPIC
Glucose, UA: NEGATIVE mg/dL
Ketones, ur: NEGATIVE mg/dL
Leukocytes, UA: NEGATIVE
Nitrite: NEGATIVE
Protein, ur: 100 mg/dL — AB
pH: 5 (ref 5.0–8.0)

## 2013-08-06 LAB — GLUCOSE, CAPILLARY: Glucose-Capillary: 201 mg/dL — ABNORMAL HIGH (ref 70–99)

## 2013-08-06 LAB — CBC WITH DIFFERENTIAL/PLATELET
Basophils Absolute: 0 10*3/uL (ref 0.0–0.1)
Eosinophils Relative: 1 % (ref 0–5)
Lymphocytes Relative: 18 % (ref 12–46)
Lymphs Abs: 2.1 10*3/uL (ref 0.7–4.0)
MCV: 82.2 fL (ref 78.0–100.0)
Neutrophils Relative %: 72 % (ref 43–77)
Platelets: 210 10*3/uL (ref 150–400)
RBC: 2.97 MIL/uL — ABNORMAL LOW (ref 3.87–5.11)
RDW: 16.6 % — ABNORMAL HIGH (ref 11.5–15.5)
WBC: 12.2 10*3/uL — ABNORMAL HIGH (ref 4.0–10.5)

## 2013-08-06 LAB — URINE MICROSCOPIC-ADD ON

## 2013-08-06 MED ORDER — FERROUS SULFATE 325 (65 FE) MG PO TABS
325.0000 mg | ORAL_TABLET | Freq: Every day | ORAL | Status: DC
  Administered 2013-08-08 – 2013-08-10 (×2): 325 mg via ORAL
  Filled 2013-08-06 (×5): qty 1

## 2013-08-06 MED ORDER — CEFAZOLIN SODIUM-DEXTROSE 2-3 GM-% IV SOLR: 2.0000 g | Freq: Three times a day (TID) | INTRAVENOUS | Status: DC

## 2013-08-06 MED ORDER — ENOXAPARIN SODIUM 30 MG/0.3ML ~~LOC~~ SOLN
30.0000 mg | SUBCUTANEOUS | Status: DC
  Administered 2013-08-06 – 2013-08-08 (×3): 30 mg via SUBCUTANEOUS
  Filled 2013-08-06 (×4): qty 0.3

## 2013-08-06 MED ORDER — VANCOMYCIN HCL 10 G IV SOLR
1500.0000 mg | INTRAVENOUS | Status: DC
  Administered 2013-08-07: 1500 mg via INTRAVENOUS
  Filled 2013-08-06 (×2): qty 1500

## 2013-08-06 MED ORDER — OMEGA-3-ACID ETHYL ESTERS 1 G PO CAPS
1.0000 g | ORAL_CAPSULE | Freq: Three times a day (TID) | ORAL | Status: DC
  Administered 2013-08-06 – 2013-08-10 (×9): 1 g via ORAL
  Filled 2013-08-06 (×13): qty 1

## 2013-08-06 MED ORDER — HYDROMORPHONE HCL PF 1 MG/ML IJ SOLN
0.5000 mg | Freq: Once | INTRAMUSCULAR | Status: AC
  Administered 2013-08-06: 0.5 mg via INTRAVENOUS
  Filled 2013-08-06: qty 1

## 2013-08-06 MED ORDER — PIPERACILLIN-TAZOBACTAM 3.375 G IVPB
3.3750 g | Freq: Three times a day (TID) | INTRAVENOUS | Status: DC
  Administered 2013-08-06 – 2013-08-07 (×2): 3.375 g via INTRAVENOUS
  Filled 2013-08-06 (×4): qty 50

## 2013-08-06 MED ORDER — CARVEDILOL 25 MG PO TABS
25.0000 mg | ORAL_TABLET | Freq: Two times a day (BID) | ORAL | Status: DC
  Administered 2013-08-07 – 2013-08-10 (×6): 25 mg via ORAL
  Filled 2013-08-06 (×9): qty 1

## 2013-08-06 MED ORDER — INSULIN ASPART 100 UNIT/ML ~~LOC~~ SOLN
0.0000 [IU] | Freq: Every day | SUBCUTANEOUS | Status: DC
  Administered 2013-08-06: 3 [IU] via SUBCUTANEOUS
  Administered 2013-08-07 – 2013-08-08 (×2): 2 [IU] via SUBCUTANEOUS

## 2013-08-06 MED ORDER — NIACIN ER 500 MG PO CPCR
500.0000 mg | ORAL_CAPSULE | Freq: Every day | ORAL | Status: DC
  Administered 2013-08-06 – 2013-08-09 (×3): 500 mg via ORAL
  Filled 2013-08-06 (×5): qty 1

## 2013-08-06 MED ORDER — ATORVASTATIN CALCIUM 40 MG PO TABS
40.0000 mg | ORAL_TABLET | Freq: Every day | ORAL | Status: DC
  Administered 2013-08-07 – 2013-08-09 (×3): 40 mg via ORAL
  Filled 2013-08-06 (×4): qty 1

## 2013-08-06 MED ORDER — VANCOMYCIN HCL 10 G IV SOLR
1500.0000 mg | INTRAVENOUS | Status: AC
  Administered 2013-08-06: 1500 mg via INTRAVENOUS
  Filled 2013-08-06: qty 1500

## 2013-08-06 MED ORDER — PIPERACILLIN-TAZOBACTAM 3.375 G IVPB 30 MIN
3.3750 g | Freq: Once | INTRAVENOUS | Status: AC
  Administered 2013-08-06: 3.375 g via INTRAVENOUS
  Filled 2013-08-06: qty 50

## 2013-08-06 MED ORDER — ACETAMINOPHEN 325 MG PO TABS
650.0000 mg | ORAL_TABLET | Freq: Four times a day (QID) | ORAL | Status: DC | PRN
  Administered 2013-08-06 – 2013-08-14 (×7): 650 mg via ORAL
  Filled 2013-08-06 (×7): qty 2

## 2013-08-06 MED ORDER — PNEUMOCOCCAL VAC POLYVALENT 25 MCG/0.5ML IJ INJ
0.5000 mL | INJECTION | INTRAMUSCULAR | Status: AC
  Filled 2013-08-06 (×3): qty 0.5

## 2013-08-06 MED ORDER — INSULIN ASPART 100 UNIT/ML ~~LOC~~ SOLN
0.0000 [IU] | Freq: Three times a day (TID) | SUBCUTANEOUS | Status: DC
  Administered 2013-08-07: 3 [IU] via SUBCUTANEOUS
  Administered 2013-08-07 – 2013-08-08 (×2): 8 [IU] via SUBCUTANEOUS
  Administered 2013-08-08: 5 [IU] via SUBCUTANEOUS
  Administered 2013-08-08: 8 [IU] via SUBCUTANEOUS

## 2013-08-06 MED ORDER — ACETAMINOPHEN 650 MG RE SUPP: 650.0000 mg | Freq: Four times a day (QID) | RECTAL | Status: DC | PRN

## 2013-08-06 MED ORDER — SODIUM CHLORIDE 0.9 % IV SOLN
INTRAVENOUS | Status: DC
  Administered 2013-08-07: 10:00:00 via INTRAVENOUS

## 2013-08-06 MED ORDER — SODIUM CHLORIDE 0.9 % IV SOLN
Freq: Once | INTRAVENOUS | Status: AC
  Administered 2013-08-06: 12:00:00 via INTRAVENOUS

## 2013-08-06 MED ORDER — ASPIRIN 325 MG PO TABS
325.0000 mg | ORAL_TABLET | ORAL | Status: DC
  Administered 2013-08-06 – 2013-08-13 (×7): 325 mg via ORAL
  Filled 2013-08-06 (×11): qty 1

## 2013-08-06 MED ORDER — SODIUM CHLORIDE 0.9 % IV SOLN: INTRAVENOUS | Status: DC

## 2013-08-06 MED ORDER — ONDANSETRON HCL 4 MG PO TABS: 4.0000 mg | ORAL_TABLET | Freq: Four times a day (QID) | ORAL | Status: DC | PRN

## 2013-08-06 MED ORDER — INFLUENZA VAC SPLIT QUAD 0.5 ML IM SUSP
0.5000 mL | INTRAMUSCULAR | Status: AC
  Filled 2013-08-06 (×3): qty 0.5

## 2013-08-06 MED ORDER — NIACIN ER (ANTIHYPERLIPIDEMIC) 500 MG PO TBCR: 500.0000 mg | EXTENDED_RELEASE_TABLET | Freq: Every day | ORAL | Status: DC

## 2013-08-06 MED ORDER — HYDROMORPHONE HCL PF 1 MG/ML IJ SOLN
0.5000 mg | INTRAMUSCULAR | Status: DC | PRN
  Administered 2013-08-07 – 2013-08-09 (×4): 1 mg via INTRAVENOUS
  Administered 2013-08-10: 0.5 mg via INTRAVENOUS
  Filled 2013-08-06 (×5): qty 1

## 2013-08-06 MED ORDER — INSULIN GLARGINE 100 UNIT/ML ~~LOC~~ SOLN
15.0000 [IU] | Freq: Every day | SUBCUTANEOUS | Status: DC
  Administered 2013-08-07: 15 [IU] via SUBCUTANEOUS
  Filled 2013-08-06 (×2): qty 0.15

## 2013-08-06 MED ORDER — ONDANSETRON HCL 4 MG/2ML IJ SOLN: 4.0000 mg | Freq: Four times a day (QID) | INTRAMUSCULAR | Status: DC | PRN

## 2013-08-06 MED ORDER — INSULIN GLARGINE 100 UNIT/ML SOLOSTAR PEN: 15.0000 [IU] | PEN_INJECTOR | Freq: Every day | SUBCUTANEOUS | Status: DC

## 2013-08-06 MED ORDER — FUROSEMIDE 40 MG PO TABS
40.0000 mg | ORAL_TABLET | Freq: Two times a day (BID) | ORAL | Status: DC
  Filled 2013-08-06: qty 1

## 2013-08-06 MED ORDER — HYDROCODONE-ACETAMINOPHEN 5-325 MG PO TABS: 1.0000 | ORAL_TABLET | Freq: Four times a day (QID) | ORAL | Status: DC | PRN

## 2013-08-06 NOTE — ED Provider Notes (Signed)
CSN: [id number]     Arrival date & time 08/06/13  [id number] History   First MD Initiated Contact with Patient 08/06/13 [id number]     Chief Complaint  Patient presents with  . Hyperglycemia  . Wound Infection    left foot 2nd digit    (Consider location/radiation/quality/duration/timing/severity/associated sxs/prior Treatment) HPI Comments: 62 yo female with uncontrolled DM, PVD without stents, CAD, obesity, HTN, CRF presents with left toe pain/ drainage and fevers.  Her glucose has been elevated in 300s recently and the pst 2 to 3 days she developed yellow drainage, redness and pain in 2nd left toe.  She has fup with ortho and future plans for either natural amputation or surgical, Dr Sharol Given.  Not currently on abx.  Last abx in July.  Nothing improves.  Past smoker.  No etoh.  Pt lives with family.  Patient is a 62 y.o. female presenting with hyperglycemia. The history is provided by the patient.  Hyperglycemia Severity:  Moderate Associated symptoms: fatigue and fever   Associated symptoms: no abdominal pain, no chest pain, no dysuria, no shortness of breath and no vomiting     Past Medical History  Diagnosis Date  . ST elevation myocardial infarction (STEMI) of inferior wall  03/2009    Ostial/proximal RCA occlusion treated with BMS stent  . CAD (coronary artery disease), native coronary artery      status post PCI to the RCA for inferior STEMI  . Cancer     endo ca  . Hypertension associated with diabetes   . Hyperlipidemia LDL goal <70   . Diabetes mellitus, type II, insulin dependent     With complications - CAD, CVA, peripheral ulcer ,  . Stroke/cerebrovascular accident  4/30/ 2014    Right-sided weakness, mostly with balance issues and only mild weakness.  . Diabetic peripheral neuropathy associated with type 2 diabetes mellitus   . Obesity, Class III, BMI 40-49.9 (morbid obesity)     BMI 43; 5' 2',  235 pounds 6.4 ounces  . CKD (chronic kidney disease), stage IV     Recent acute on  chronic exacerbation in July 2014  . PAD (peripheral artery disease)   . Diabetic foot ulcer   . Dry gangrene     Left second toe; now on Sole of L foot   Past Surgical History  Procedure Laterality Date  . Abdominal hysterectomy    . Leg tendon surgery      patient fell in a store right leg  . Leg surgery      hole in bone( during Childhood)  . Cardiac catheterization  03/31/2009    Proximal RCA thrombotic occlusion (inferior STEMI) other coronaries but in a codominant system  . Coronary angioplasty  03/31/2009    PTCA , bare-metal stent 3.5 mm x 24 mm ostium of RCA ,EF 55%   Family History  Problem Relation Age of Onset  . Alcoholism Mother     Died from complications  . Alcoholism Father     Died from complications  . Heart disease      Unknown, unclear. Patient is not a good historian.  Essentially no pertinent history known   History  Substance Use Topics  . Smoking status: Former Smoker    Quit date: 04/29/1995  . Smokeless tobacco: Never Used  . Alcohol Use: No   OB History   Grav Para Term Preterm Abortions TAB SAB Ect Mult Living  Review of Systems  Constitutional: Positive for fever and fatigue. Negative for chills.  HENT: Negative for neck pain and neck stiffness.   Eyes: Negative for visual disturbance.  Respiratory: Negative for shortness of breath.   Cardiovascular: Negative for chest pain.  Gastrointestinal: Negative for vomiting and abdominal pain.  Genitourinary: Negative for dysuria and flank pain.  Musculoskeletal: Negative for back pain.  Skin: Positive for rash and wound.  Neurological: Positive for light-headedness. Negative for headaches.    Allergies  Review of patient's allergies indicates no known allergies.  Home Medications   Current Outpatient Rx  Name  Route  Sig  Dispense  Refill  . aspirin 325 MG tablet   Oral   Take 325 mg by mouth daily. Take 30 minutes after taking  500 cr niaspan         . atorvastatin  (LIPITOR) 40 MG tablet   Oral   Take 1 tablet (40 mg total) by mouth daily at 6 PM.         . carvedilol (COREG) 25 MG tablet   Oral   Take 25 mg by mouth 2 (two) times daily with a meal.         . ferrous sulfate 325 (65 FE) MG tablet   Oral   Take 325 mg by mouth daily with breakfast.         . furosemide (LASIX) 40 MG tablet   Oral   Take 1 tablet (40 mg total) by mouth 2 (two) times daily.   60 tablet   3   . insulin aspart (NOVOLOG) 100 UNIT/ML injection   Subcutaneous   Inject 0-15 Units into the skin 3 (three) times daily with meals. Sliding scale insulin CBG 70 - 120: 0 units: CBG 121 - 150: 2 units; CBG 151 - 200: 3 units; CBG 201 - 250: 5 units; CBG 251 - 300: 8 units;CBG 301 - 350: 11 units; CBG 351 - 400: 15 units; CBG > 400 : 15 units and notify your doctor   1 vial   12   . Insulin Glargine (LANTUS SOLOSTAR) 100 UNIT/ML SOPN   Subcutaneous   Inject 30 Units into the skin at bedtime.         . niacin (NIASPAN) 500 MG CR tablet      500 mg. Take 1 tablet (500 mg total) by mouth at bedtime. Take with snack.  30 minutes prior to taking medication take aspirin 325 mg daily.         Marland Kitchen omega-3 acid ethyl esters (LOVAZA) 1 G capsule   Oral   Take 1 g by mouth 3 (three) times daily.           BP 128/51  Pulse 80  Temp(Src) 99.5 F (37.5 C) (Oral)  Resp 18  SpO2 97% Physical Exam  Nursing note and vitals reviewed. Constitutional: She is oriented to person, place, and time. She appears well-developed and well-nourished.  HENT:  Head: Normocephalic and atraumatic.  Dry mm  Eyes: Conjunctivae are normal. Right eye exhibits no discharge. Left eye exhibits no discharge.  Neck: Normal range of motion. Neck supple. No tracheal deviation present.  Cardiovascular: Normal rate and regular rhythm.   Pulmonary/Chest: Effort normal and breath sounds normal.  Abdominal: Soft. She exhibits no distension. There is no tenderness. There is no guarding.  obese   Musculoskeletal: She exhibits edema and tenderness.  Neurological: She is alert and oriented to person, place, and time.  General weakness  Skin:  Skin is warm. There is erythema.  Left second toe necrotic distal with open ulceration/ yellow drainage proximal, warmth, mild erythema to distal left dorsal foot, cap refill 3 sec  Psychiatric: She has a normal mood and affect.    ED Course  Procedures (including critical care time) Labs Review Labs Reviewed  COMPREHENSIVE METABOLIC PANEL - Abnormal; Notable for the following:    Sodium 134 (*)    Glucose, Bld 171 (*)    BUN 84 (*)    Creatinine, Ser 3.14 (*)    Albumin 2.7 (*)    Alkaline Phosphatase 125 (*)    GFR calc non Af Amer 15 (*)    GFR calc Af Amer 17 (*)    All other components within normal limits  CBC WITH DIFFERENTIAL - Abnormal; Notable for the following:    WBC 12.2 (*)    RBC 2.97 (*)    Hemoglobin 7.9 (*)    HCT 24.4 (*)    RDW 16.6 (*)    Neutro Abs 8.8 (*)    Monocytes Absolute 1.2 (*)    All other components within normal limits  URINALYSIS, ROUTINE W REFLEX MICROSCOPIC - Abnormal; Notable for the following:    APPearance CLOUDY (*)    Hgb urine dipstick TRACE (*)    Protein, ur 100 (*)    All other components within normal limits  GLUCOSE, CAPILLARY - Abnormal; Notable for the following:    Glucose-Capillary 201 (*)    All other components within normal limits  URINE MICROSCOPIC-ADD ON - Abnormal; Notable for the following:    Casts HYALINE CASTS (*)    All other components within normal limits  CULTURE, BLOOD (ROUTINE X 2)  CULTURE, BLOOD (ROUTINE X 2)   Imaging Review No results found.  MDM  No diagnosis found. Complex medical hx with skin infection secondary to ulcer/ necrotic toe. Hyperglycemia- fluids given to start. Wound infection - xray for signs of osteo- vancomycin IV. Plan for admission and likely ortho consult.  Pain medicines given. Zosyn and vanco, spoke with pharmacy.   Spoke  with Dr Sharol Given who will consult his colleague for care and spoke with Dr V hospitalist for general admission. Non toxic appearing on admission.  Dg Foot Complete Left  08/06/2013   CLINICAL DATA:  Blister on the 2nd toe, worsening.  EXAM: LEFT FOOT - COMPLETE 3+ VIEW  COMPARISON:  Plain films 05/1913 and 05/30/2013.  FINDINGS: The patient's claw second toe is in flexion somewhat limiting evaluation. No bony destructive change or soft tissue gas collection is identified. No radiopaque foreign body is seen. There is no fracture or dislocation. Extensive atherosclerosis is noted. Large calcaneal spur and midfoot degenerative change are seen.  IMPRESSION: Negative for plain film evidence of osteomyelitis. No acute finding.   Electronically Signed   By: Inge Rise M.D.   On: 08/06/2013 10:56   Dry gangrene toe, Foot infection, DM uncontrolled, PVD  Mariea Clonts, MD 08/06/13 1314

## 2013-08-06 NOTE — ED Notes (Signed)
Per [facility] pt c/o fever for several days and elevated CBG 300.  Pt has gangrenous toe left foot 2nd digit.  Infection since May- not scheduled for removal.  Yellow drainage with foul odor and black skin.  Good CMS to foot per pt

## 2013-08-06 NOTE — ED Notes (Signed)
MD at bedside. 

## 2013-08-06 NOTE — Progress Notes (Addendum)
ANTIBIOTIC CONSULT NOTE - INITIAL  Pharmacy Consult for Vancomycin Indication: Necrotic toe with infection/fever  No Known Allergies  Patient Measurements:   Body Weight: 102.1 kg as of 07/23/13  Vital Signs: Temp: 99.5 F (37.5 C) (09/18 [id number]) Temp src: Oral (09/18 0938) BP: 128/51 mmHg (09/18 0938) Pulse Rate: 80 (09/18 0938) Intake/Output from previous day:   Intake/Output from this shift:    Labs: No results found for this basename: WBC, HGB, PLT, LABCREA, CREATININE,  in the last 72 hours The CrCl is unknown because both a height and weight (above a minimum accepted value) are required for this calculation. No results found for this basename: VANCOTROUGH, VANCOPEAK, VANCORANDOM, GENTTROUGH, GENTPEAK, GENTRANDOM, TOBRATROUGH, TOBRAPEAK, TOBRARND, AMIKACINPEAK, AMIKACINTROU, AMIKACIN,  in the last 72 hours   Microbiology: No results found for this or any previous visit (from the past 720 hour(s)).  Medical History: Past Medical History  Diagnosis Date  . ST elevation myocardial infarction (STEMI) of inferior wall  03/2009    Ostial/proximal RCA occlusion treated with BMS stent  . CAD (coronary artery disease), native coronary artery      status post PCI to the RCA for inferior STEMI  . Cancer     endo ca  . Hypertension associated with diabetes   . Hyperlipidemia LDL goal <70   . Diabetes mellitus, type II, insulin dependent     With complications - CAD, CVA, peripheral ulcer ,  . Stroke/cerebrovascular accident  4/30/ 2014    Right-sided weakness, mostly with balance issues and only mild weakness.  . Diabetic peripheral neuropathy associated with type 2 diabetes mellitus   . Obesity, Class III, BMI 40-49.9 (morbid obesity)     BMI 43; 5' 2',  235 pounds 6.4 ounces  . CKD (chronic kidney disease), stage IV     Recent acute on chronic exacerbation in July 2014  . PAD (peripheral artery disease)   . Diabetic foot ulcer   . Dry gangrene     Left second toe; now on  Sole of L foot    Assessment: 35 yoF with gangrenous toe on left foot presents with fever x several days.  PMH significant for HTN, HLD, DM, CKD, HF, and July 11th admission for this gangrenous toe for which plan was for watchful waiting due to acute renal failure since there was no cellulitis or evidence of osteomyelitis or infection.  Has been followed as an outpatient.  Today, patient presents with yellow drainage and foul odor from toe and fever.  Starting IV Vancomycin for toe infection.  Vancomycin 1.5 g IV x 1 sent to ED.  Current temperature 99.5  SCr ordered.  Blood cultures ordered.  Goal of Therapy:  Vancomycin trough level 15-20 mcg/ml (higher trough goal until OM can be ruled out)  Plan:  1.  Waiting on SCr result to schedule further vancomycin doses.  Pt has history of renal failure. 2.  Would consider adding gram negative and anaerobic coverage given PVD and DM.  Hershal Coria 08/06/2013,10:16 AM  Addendum:  08/06/2013 10:46 AM After speaking with Dr. Reather Converse, MD will give one dose of Zosyn for gram negative and anaerobic coverage and then will re-evaluate need for continued broad spectrum coverage after scans.  Hershal Coria, PharmD, BCPS 08/06/2013 10:48 AM  Addendum: 08/06/2013 1:30 PM Lab work resulted:  SCr 3.14, CrCl ~21 ml/min/1.61m2 (normalized)  Left foot xray today: Negative for plain film evidence of osteomyelitis.  Plan: Vancomycin dosing for obese 45 yoF with renal failure with goal  trough adjusted to 10-15 mcg/ml since OM has been ruled out by films.  Received 1.5g dose at 1230 today.  Start Vancomycin 1500 mg IV q48h, first dose 9/20.  F/u SCr for adjustments to this dose.  Would still consider adding continued empiric gram negative and anaerobic coverage given PVD and DM.  Hershal Coria, PharmD, BCPS Pager: [phone] 08/06/2013 1:34 PM

## 2013-08-06 NOTE — Progress Notes (Signed)
ANTIBIOTIC CONSULT NOTE - INITIAL  Pharmacy Consult for Zosyn Indication: Necrotic toe with infection/fever   No Known Allergies  Patient Measurements: Height: 5' 2.99" (160 cm) (Documented in chart 07/23/13) Weight: 225 lb 1.4 oz (102.1 kg) (Documented in chart 07/23/13) IBW/kg (Calculated) : 52.38   Vital Signs: Temp: 99 F (37.2 C) (09/18 1401) Temp src: Oral (09/18 1401) BP: 117/66 mmHg (09/18 1401) Pulse Rate: 79 (09/18 1401) Intake/Output from previous day:   Intake/Output from this shift:    Labs:  Recent Labs  08/06/13 1115  WBC 12.2*  HGB 7.9*  PLT 210  CREATININE 3.14*   Estimated Creatinine Clearance: 21.2 ml/min (by C-G formula based on Cr of 3.14). No results found for this basename: VANCOTROUGH, VANCOPEAK, VANCORANDOM, GENTTROUGH, GENTPEAK, GENTRANDOM, TOBRATROUGH, TOBRAPEAK, TOBRARND, AMIKACINPEAK, AMIKACINTROU, AMIKACIN,  in the last 72 hours   Microbiology: No results found for this or any previous visit (from the past 720 hour(s)).  Medical History: Past Medical History  Diagnosis Date  . ST elevation myocardial infarction (STEMI) of inferior wall  03/2009    Ostial/proximal RCA occlusion treated with BMS stent  . CAD (coronary artery disease), native coronary artery      status post PCI to the RCA for inferior STEMI  . Cancer     endo ca  . Hypertension associated with diabetes   . Hyperlipidemia LDL goal <70   . Diabetes mellitus, type II, insulin dependent     With complications - CAD, CVA, peripheral ulcer ,  . Stroke/cerebrovascular accident  4/30/ 2014    Right-sided weakness, mostly with balance issues and only mild weakness.  . Diabetic peripheral neuropathy associated with type 2 diabetes mellitus   . Obesity, Class III, BMI 40-49.9 (morbid obesity)     BMI 43; 5' 2',  235 pounds 6.4 ounces  . CKD (chronic kidney disease), stage IV     Recent acute on chronic exacerbation in July 2014  . PAD (peripheral artery disease)   . Diabetic  foot ulcer   . Dry gangrene     Left second toe; now on Sole of L foot    Medications:  Scheduled:  . [START ON 08/08/2013] aspirin  325 mg Oral Daily  . [START ON 08/07/2013] atorvastatin  40 mg Oral q1800  . [START ON 08/07/2013] carvedilol  25 mg Oral BID WC  . enoxaparin (LOVENOX) injection  30 mg Subcutaneous Q24H  . [START ON 08/07/2013] ferrous sulfate  325 mg Oral Q breakfast  . [START ON 08/08/2013] furosemide  40 mg Oral BID  . [START ON 08/07/2013] insulin aspart  0-15 Units Subcutaneous TID WC  . insulin aspart  0-5 Units Subcutaneous QHS  . [START ON 08/07/2013] Insulin Glargine  15 Units Subcutaneous QHS  . niacin  500 mg Oral QHS  . omega-3 acid ethyl esters  1 g Oral TID  . piperacillin-tazobactam (ZOSYN)  IV  3.375 g Intravenous Q8H  . vancomycin  1,500 mg Intravenous Q48H   Infusions:  . sodium chloride     PRN: acetaminophen, acetaminophen, HYDROcodone-acetaminophen, HYDROmorphone (DILAUDID) injection, ondansetron (ZOFRAN) IV, ondansetron Assessment: 62 yo F with gangrenous toe on left foot presents with fever x several days. PMH significant for HTN, HLD, DM, CKD, HF, and July 11th admission for this gangrenous toe for which plan was for watchful waiting due to acute renal failure since there was no cellulitis or evidence of osteomyelitis or infection. Has been followed as an outpatient. Today, patient presents with yellow drainage and foul  odor from toe and fever   Goal of Therapy:  Eradication of infection  Plan:   Zosyn 3.375 Gm IV over 4 hours q 8 hours  Follow up on cultures and CrCl as may need to back off with history of renal failure.   Gypsy Decant 08/06/2013,8:04 PM

## 2013-08-06 NOTE — Consult Note (Signed)
ORTHOPAEDIC CONSULTATION  REQUESTING PHYSICIAN: Sheila Oats, MD  Chief Complaint: left 2nd toe gangrene  HPI: [name] is a 62 y.o. female who complains of left 2nd toe gangrene for the last few days.  She is a poorly controlled diabetic.  She endorses pain in the toe.  Currently admitted to the hospitalist service.  Past Medical History  Diagnosis Date  . ST elevation myocardial infarction (STEMI) of inferior wall  03/2009    Ostial/proximal RCA occlusion treated with BMS stent  . CAD (coronary artery disease), native coronary artery      status post PCI to the RCA for inferior STEMI  . Cancer     endo ca  . Hypertension associated with diabetes   . Hyperlipidemia LDL goal <70   . Diabetes mellitus, type II, insulin dependent     With complications - CAD, CVA, peripheral ulcer ,  . Stroke/cerebrovascular accident  4/30/ 2014    Right-sided weakness, mostly with balance issues and only mild weakness.  . Diabetic peripheral neuropathy associated with type 2 diabetes mellitus   . Obesity, Class III, BMI 40-49.9 (morbid obesity)     BMI 43; 5' 2',  235 pounds 6.4 ounces  . CKD (chronic kidney disease), stage IV     Recent acute on chronic exacerbation in July 2014  . PAD (peripheral artery disease)   . Diabetic foot ulcer   . Dry gangrene     Left second toe; now on Sole of L foot   Past Surgical History  Procedure Laterality Date  . Abdominal hysterectomy    . Leg tendon surgery      patient fell in a store right leg  . Leg surgery      hole in bone( during Childhood)  . Cardiac catheterization  03/31/2009    Proximal RCA thrombotic occlusion (inferior STEMI) other coronaries but in a codominant system  . Coronary angioplasty  03/31/2009    PTCA , bare-metal stent 3.5 mm x 24 mm ostium of RCA ,EF 55%   History   Social History  . Marital Status: Widowed    Spouse Name: N/A    Number of Children: 4  . Years of Education: N/A   Occupational History  .      Social History Main Topics  . Smoking status: Former Smoker    Quit date: 04/29/1995  . Smokeless tobacco: Never Used  . Alcohol Use: No  . Drug Use: No  . Sexual Activity: No   Other Topics Concern  . None   Social History Narrative   Was previously living in an assisted living center while recovering from her stroke. Lesion is now recently moved back home with her family.    She is attended by her daughter, who is super morbidly obese.            Family History  Problem Relation Age of Onset  . Alcoholism Mother     Died from complications  . Alcoholism Father     Died from complications  . Heart disease      Unknown, unclear. Patient is not a good historian.  Essentially no pertinent history known   No Known Allergies Prior to Admission medications   Medication Sig Start Date End Date Taking? Authorizing Provider  aspirin 325 MG tablet Take 325 mg by mouth daily. Take 30 minutes after taking  500 cr niaspan   Yes Historical Provider, MD  atorvastatin (LIPITOR) 40 MG tablet Take 1 tablet (40 mg  total) by mouth daily at 6 PM. 03/23/13  Yes Geradine Girt, DO  carvedilol (COREG) 25 MG tablet Take 25 mg by mouth 2 (two) times daily with a meal.   Yes Historical Provider, MD  ferrous sulfate 325 (65 FE) MG tablet Take 325 mg by mouth daily with breakfast.   Yes Historical Provider, MD  furosemide (LASIX) 40 MG tablet Take 1 tablet (40 mg total) by mouth 2 (two) times daily. 06/09/13  Yes Ripudeep Krystal Eaton, MD  insulin aspart (NOVOLOG) 100 UNIT/ML injection Inject 0-15 Units into the skin 3 (three) times daily with meals. Sliding scale insulin CBG 70 - 120: 0 units: CBG 121 - 150: 2 units; CBG 151 - 200: 3 units; CBG 201 - 250: 5 units; CBG 251 - 300: 8 units;CBG 301 - 350: 11 units; CBG 351 - 400: 15 units; CBG > 400 : 15 units and notify your doctor 06/09/13  Yes Ripudeep Krystal Eaton, MD  Insulin Glargine (LANTUS SOLOSTAR) 100 UNIT/ML SOPN Inject 30 Units into the skin at bedtime.   Yes  Historical Provider, MD  niacin (NIASPAN) 500 MG CR tablet 500 mg. Take 1 tablet (500 mg total) by mouth at bedtime. Take with snack.  30 minutes prior to taking medication take aspirin 325 mg daily. 07/10/13 07/10/14 Yes Historical Provider, MD  omega-3 acid ethyl esters (LOVAZA) 1 G capsule Take 1 g by mouth 3 (three) times daily.    Yes Historical Provider, MD   Dg Foot Complete Left  08/06/2013   CLINICAL DATA:  Blister on the 2nd toe, worsening.  EXAM: LEFT FOOT - COMPLETE 3+ VIEW  COMPARISON:  Plain films 05/1913 and 05/30/2013.  FINDINGS: The patient's claw second toe is in flexion somewhat limiting evaluation. No bony destructive change or soft tissue gas collection is identified. No radiopaque foreign body is seen. There is no fracture or dislocation. Extensive atherosclerosis is noted. Large calcaneal spur and midfoot degenerative change are seen.  IMPRESSION: Negative for plain film evidence of osteomyelitis. No acute finding.   Electronically Signed   By: Inge Rise M.D.   On: 08/06/2013 10:56    Positive ROS: All other systems have been reviewed and were otherwise negative with the exception of those mentioned in the HPI and as above.  Physical Exam: General: Alert, no acute distress Cardiovascular: No pedal edema Respiratory: No cyanosis, no use of accessory musculature GI: No organomegaly, abdomen is soft and non-tender Skin: No lesions in the area of chief complaint Neurologic: Sensation intact distally Psychiatric: Patient is competent for consent with normal mood and affect Lymphatic: No axillary or cervical lymphadenopathy  MUSCULOSKELETAL:  Left foot: 2nd toe gangrene with yellow purulent drainage.  Wound probes down to bone and tendon.  Foot is moderately swollen, nonpalpable pulses.  All other toes unaffected.  Slightly warm to touch.  Assessment: Left 2nd toe gangrene  Plan: - xrays and vascular studies reviewed - not candidate for revascularization per Dr.  Gwenlyn Found. - would recommend amputation of 2nd toe possible ray resection vs. Transmetatarsal amputation tomorrow - continue abx per primary team - NPO after midnight - call with questions  Thank you for the consult and the opportunity to see Ms. Parrott  N. Eduard Roux, MD [place] [phone] 4:08 PM

## 2013-08-06 NOTE — H&P (Addendum)
[facility] History and Physical  [name] [id number] DOB: 26-Nov-1950 DOA: 08/06/2013  Referring physician: Dr Reather Converse PCP: Hayden Rasmussen., MD  Specialists: Dr Sharol Given  Chief Complaint: increasing L. toe pain and drainage  HPI: [name] is a 62 y.o. female with h/o DM with L.second toe ulcer since May 2014 followed by Dr Sharol Given, CAD, PAD, CVA, CKD, HTN and multiple medical problems as listed below who presents with the above complaints. She states that for the past 2days she has had increased pain in that foot and a drainage from the ulcer. She also began having subjective fevers and so came to the ED. In the ED plain films of the left foot showed no evidence of osteo, WBC 12,  pt was started on empiric abx and Ortho/Dr Sharol Given consulted and admission to [facility] admitted for further eval and management.   Review of Systems: The patient denies anorexia, fever, weight loss,, vision loss, decreased hearing, hoarseness, chest pain, syncope, dyspnea on exertion, peripheral edema, balance deficits, hemoptysis, abdominal pain, melena, hematochezia, severe indigestion/heartburn, hematuria, incontinence, genital sores, muscle weakness, suspicious skin lesions, transient blindness, difficulty walking, depression, unusual weight change, abnormal bleeding, enlarged lymph nodes, angioedema, and breast masses.    Past Medical History  Diagnosis Date  . ST elevation myocardial infarction (STEMI) of inferior wall  03/2009    Ostial/proximal RCA occlusion treated with BMS stent  . CAD (coronary artery disease), native coronary artery      status post PCI to the RCA for inferior STEMI  . Cancer     endo ca  . Hypertension associated with diabetes   . Hyperlipidemia LDL goal <70   . Diabetes mellitus, type II, insulin dependent     With complications - CAD, CVA, peripheral ulcer ,  . Stroke/cerebrovascular accident  4/30/ 2014    Right-sided weakness, mostly with balance issues and only mild  weakness.  . Diabetic peripheral neuropathy associated with type 2 diabetes mellitus   . Obesity, Class III, BMI 40-49.9 (morbid obesity)     BMI 43; 5' 2',  235 pounds 6.4 ounces  . CKD (chronic kidney disease), stage IV     Recent acute on chronic exacerbation in July 2014  . PAD (peripheral artery disease)   . Diabetic foot ulcer   . Dry gangrene     Left second toe; now on Sole of L foot   Past Surgical History  Procedure Laterality Date  . Abdominal hysterectomy    . Leg tendon surgery      patient fell in a store right leg  . Leg surgery      hole in bone( during Childhood)  . Cardiac catheterization  03/31/2009    Proximal RCA thrombotic occlusion (inferior STEMI) other coronaries but in a codominant system  . Coronary angioplasty  03/31/2009    PTCA , bare-metal stent 3.5 mm x 24 mm ostium of RCA ,EF 55%   Social History:  reports that she quit smoking about 18 years ago. She has never used smokeless tobacco. She reports that she does not drink alcohol or use illicit drugs. where does patient live--home   No Known Allergies  Family History  Problem Relation Age of Onset  . Alcoholism Mother     Died from complications  . Alcoholism Father     Died from complications  . Heart disease      Unknown, unclear. Patient is not a good historian.  Essentially no pertinent history known    Prior to Admission  medications   Medication Sig Start Date End Date Taking? Authorizing Provider  aspirin 325 MG tablet Take 325 mg by mouth daily. Take 30 minutes after taking  500 cr niaspan   Yes Historical Provider, MD  atorvastatin (LIPITOR) 40 MG tablet Take 1 tablet (40 mg total) by mouth daily at 6 PM. 03/23/13  Yes Geradine Girt, DO  carvedilol (COREG) 25 MG tablet Take 25 mg by mouth 2 (two) times daily with a meal.   Yes Historical Provider, MD  ferrous sulfate 325 (65 FE) MG tablet Take 325 mg by mouth daily with breakfast.   Yes Historical Provider, MD  furosemide (LASIX) 40  MG tablet Take 1 tablet (40 mg total) by mouth 2 (two) times daily. 06/09/13  Yes Ripudeep Krystal Eaton, MD  insulin aspart (NOVOLOG) 100 UNIT/ML injection Inject 0-15 Units into the skin 3 (three) times daily with meals. Sliding scale insulin CBG 70 - 120: 0 units: CBG 121 - 150: 2 units; CBG 151 - 200: 3 units; CBG 201 - 250: 5 units; CBG 251 - 300: 8 units;CBG 301 - 350: 11 units; CBG 351 - 400: 15 units; CBG > 400 : 15 units and notify your doctor 06/09/13  Yes Ripudeep Krystal Eaton, MD  Insulin Glargine (LANTUS SOLOSTAR) 100 UNIT/ML SOPN Inject 30 Units into the skin at bedtime.   Yes Historical Provider, MD  niacin (NIASPAN) 500 MG CR tablet 500 mg. Take 1 tablet (500 mg total) by mouth at bedtime. Take with snack.  30 minutes prior to taking medication take aspirin 325 mg daily. 07/10/13 07/10/14 Yes Historical Provider, MD  omega-3 acid ethyl esters (LOVAZA) 1 G capsule Take 1 g by mouth 3 (three) times daily.    Yes Historical Provider, MD   Physical Exam: Filed Vitals:   08/06/13 1401  BP: 117/66  Pulse: 79  Temp: 99 F (37.2 C)  Resp: 18    Constitutional: Vital signs reviewed.  Patient is a well-developed and well-nourished  in no acute distress and cooperative with exam. Alert and oriented x3.  Head: Normocephalic and atraumatic Mouth: no erythema or exudates, slightly dry MMM Eyes: PERRL, EOMI, conjunctivae normal, No scleral icterus.  Neck: Supple, Trachea midline normal ROM, No JVD, mass, thyromegaly, or carotid bruit present.  Cardiovascular: RRR, S1 normal, S2 normal, no MRG, pulses symmetric and intact bilaterally Pulmonary/Chest: normal respiratory effort, CTAB, no wheezes, rales, or rhonchi Abdominal: Soft. Non-tender, non-distended, bowel sounds are normal, no masses, organomegaly, or guarding present.  GU: no CVA tenderness Extremities:L. 2nd toe gangrenous with moderate sized ulceration in mid area of digit with purulent/malodorous drainage and foot with surrounding edema. RLE with no  edema and no cyanosis. Neurological: A&O x3, Strength is normal and symmetric bilaterally, cranial nerve II-XII are grossly intact, no focal motor deficit, sensory grossly intact.  Skin: Warm, dry and intact. No rash, cyanosis, or clubbing.  Psychiatric: Normal mood and affect. speech and behavior is normal.      Labs on Admission:  Basic Metabolic Panel:  Recent Labs Lab 08/06/13 1115  NA 134*  K 3.8  CL 96  CO2 23  GLUCOSE 171*  BUN 84*  CREATININE 3.14*  CALCIUM 9.8   Liver Function Tests:  Recent Labs Lab 08/06/13 1115  AST 10  ALT 7  ALKPHOS 125*  BILITOT 0.4  PROT 8.1  ALBUMIN 2.7*   No results found for this basename: LIPASE, AMYLASE,  in the last 168 hours No results found for this basename: AMMONIA,  in the last 168 hours CBC:  Recent Labs Lab 08/06/13 1115  WBC 12.2*  NEUTROABS 8.8*  HGB 7.9*  HCT 24.4*  MCV 82.2  PLT 210   Cardiac Enzymes: No results found for this basename: CKTOTAL, CKMB, CKMBINDEX, TROPONINI,  in the last 168 hours  BNP (last 3 results)  Recent Labs  09/25/12 1250  PROBNP 1010.0*   CBG:  Recent Labs Lab 08/06/13 1020 08/06/13 1319  GLUCAP 201* 152*    Radiological Exams on Admission: Dg Foot Complete Left  08/06/2013   CLINICAL DATA:  Blister on the 2nd toe, worsening.  EXAM: LEFT FOOT - COMPLETE 3+ VIEW  COMPARISON:  Plain films 05/1913 and 05/30/2013.  FINDINGS: The patient's claw second toe is in flexion somewhat limiting evaluation. No bony destructive change or soft tissue gas collection is identified. No radiopaque foreign body is seen. There is no fracture or dislocation. Extensive atherosclerosis is noted. Large calcaneal spur and midfoot degenerative change are seen.  IMPRESSION: Negative for plain film evidence of osteomyelitis. No acute finding.   Electronically Signed   By: Inge Rise M.D.   On: 08/06/2013 10:56      Assessment/Plan Active Problems: Infected Diabetic ulcer of second left toe  with gangrene -As discussed above, plain X-ray neg for evidence of osteo -will continue on empiric abx with Vanc and zosyn sterted inED -Appreciate ortho input, noted that per Dr Gwenlyn Found pt is not a candidate for revascularization -surgery planned in am- amputation of 2nd toe possible ray resection vs. Transmetatarsal amputation as per ortho -pain control with IV/PO narcotics   Anemia of chronic disease -continue supplemental iron, follow and recheck Type II or unspecified type diabetes mellitus with renal manifestations, uncontrolled(250.42)  -monitor accuchecks , cover with SSI, hold lantus tonight while NPO, follow resume and adjust as appropriate for optimal BG control  Hyponatremia, mild -likely secondary to vol depletion, gentle IVF,  hold lasix for now follow and resume as appropriate HTN -Continue outpt meds CAD/H/O MI -CP free -continue outpt meds, she is on B-blocker, will continue perioperatively CKD stage 4 -Stable. last cr in July was 3.04 monitor-follow recheck     Code Status: full Family Communication: none at bedside Disposition Plan: admit to medical floor  Time spent:>30  Sheila Oats [facility] Pager [phone]  If 7PM-7AM, please contact night-coverage [email or web address] Password [facility] 08/06/2013, 7:33 PM

## 2013-08-06 NOTE — Progress Notes (Signed)
[facility]  Patient Status: Active (receiving services up to time of hospitalization)  [facility] is providing the following services: RN  If patient discharges after hours, please call [phone].   Lurlean Leyden 08/06/2013, 3:33 PM

## 2013-08-07 ENCOUNTER — Encounter (HOSPITAL_COMMUNITY): Payer: Self-pay | Admitting: Anesthesiology

## 2013-08-07 ENCOUNTER — Encounter (HOSPITAL_COMMUNITY)
Admission: EM | Disposition: A | Discharge status: Skilled Nursing Facility | Payer: Self-pay | Source: Home / Self Care | Attending: Family Medicine

## 2013-08-07 ENCOUNTER — Inpatient Hospital Stay (HOSPITAL_COMMUNITY): Payer: Medicare Other | Admitting: Anesthesiology

## 2013-08-07 DIAGNOSIS — I96 Gangrene, not elsewhere classified: Secondary | ICD-10-CM

## 2013-08-07 HISTORY — PX: AMPUTATION: SHX166

## 2013-08-07 LAB — BASIC METABOLIC PANEL
BUN: 80 mg/dL — ABNORMAL HIGH (ref 6–23)
CO2: 21 mEq/L (ref 19–32)
Chloride: 100 mEq/L (ref 96–112)
Glucose, Bld: 262 mg/dL — ABNORMAL HIGH (ref 70–99)
Potassium: 4.3 mEq/L (ref 3.5–5.1)

## 2013-08-07 LAB — GLUCOSE, CAPILLARY
Glucose-Capillary: 275 mg/dL — ABNORMAL HIGH (ref 70–99)
Glucose-Capillary: 290 mg/dL — ABNORMAL HIGH (ref 70–99)
Glucose-Capillary: 218 mg/dL — ABNORMAL HIGH (ref 70–99)

## 2013-08-07 LAB — CBC
HCT: 21.2 % — ABNORMAL LOW (ref 36.0–46.0)
Hemoglobin: 6.9 g/dL — CL (ref 12.0–15.0)
RBC: 2.56 MIL/uL — ABNORMAL LOW (ref 3.87–5.11)
WBC: 10.1 10*3/uL (ref 4.0–10.5)

## 2013-08-07 LAB — MRSA PCR SCREENING: MRSA by PCR: NEGATIVE

## 2013-08-07 SURGERY — AMPUTATION, FOOT, PARTIAL
Anesthesia: General | Site: Foot | Laterality: Left | Wound class: Dirty or Infected

## 2013-08-07 MED ORDER — ONDANSETRON HCL 4 MG PO TABS: 4.0000 mg | ORAL_TABLET | Freq: Four times a day (QID) | ORAL | Status: DC | PRN

## 2013-08-07 MED ORDER — PROMETHAZINE HCL 25 MG/ML IJ SOLN: 6.2500 mg | INTRAMUSCULAR | Status: DC | PRN

## 2013-08-07 MED ORDER — METOCLOPRAMIDE HCL 5 MG/ML IJ SOLN
INTRAMUSCULAR | Status: DC | PRN
  Administered 2013-08-07: 10 mg via INTRAVENOUS

## 2013-08-07 MED ORDER — OXYCODONE HCL 5 MG PO TABS: 5.0000 mg | ORAL_TABLET | ORAL | Status: DC | PRN

## 2013-08-07 MED ORDER — HYDROCODONE-ACETAMINOPHEN 5-325 MG PO TABS
1.0000 | ORAL_TABLET | ORAL | Status: DC | PRN
  Administered 2013-08-07: 2 via ORAL
  Filled 2013-08-07 (×2): qty 2

## 2013-08-07 MED ORDER — PHENYLEPHRINE HCL 10 MG/ML IJ SOLN
INTRAMUSCULAR | Status: DC | PRN
  Administered 2013-08-07 (×2): 80 ug via INTRAVENOUS

## 2013-08-07 MED ORDER — MIDAZOLAM HCL 5 MG/5ML IJ SOLN
INTRAMUSCULAR | Status: DC | PRN
  Administered 2013-08-07: 1 mg via INTRAVENOUS

## 2013-08-07 MED ORDER — LACTATED RINGERS IV SOLN
INTRAVENOUS | Status: DC | PRN
  Administered 2013-08-07: 07:00:00 via INTRAVENOUS

## 2013-08-07 MED ORDER — SENNA 8.6 MG PO TABS
1.0000 | ORAL_TABLET | Freq: Two times a day (BID) | ORAL | Status: DC
  Administered 2013-08-07 – 2013-08-08 (×3): 8.6 mg via ORAL
  Filled 2013-08-07 (×3): qty 1

## 2013-08-07 MED ORDER — METOCLOPRAMIDE HCL 5 MG/ML IJ SOLN: 5.0000 mg | Freq: Three times a day (TID) | INTRAMUSCULAR | Status: DC | PRN

## 2013-08-07 MED ORDER — METOCLOPRAMIDE HCL 10 MG PO TABS: 5.0000 mg | ORAL_TABLET | Freq: Three times a day (TID) | ORAL | Status: DC | PRN

## 2013-08-07 MED ORDER — HYDROCODONE-ACETAMINOPHEN 5-325 MG PO TABS
1.0000 | ORAL_TABLET | ORAL | Status: DC | PRN
  Administered 2013-08-07 – 2013-08-08 (×2): 2 via ORAL
  Filled 2013-08-07: qty 2

## 2013-08-07 MED ORDER — EPHEDRINE SULFATE 50 MG/ML IJ SOLN
INTRAMUSCULAR | Status: DC | PRN
  Administered 2013-08-07: 10 mg via INTRAVENOUS

## 2013-08-07 MED ORDER — MORPHINE SULFATE 2 MG/ML IJ SOLN: 1.0000 mg | INTRAMUSCULAR | Status: DC | PRN

## 2013-08-07 MED ORDER — ONDANSETRON HCL 4 MG/2ML IJ SOLN: 4.0000 mg | Freq: Four times a day (QID) | INTRAMUSCULAR | Status: DC | PRN

## 2013-08-07 MED ORDER — PROPOFOL 10 MG/ML IV BOLUS
INTRAVENOUS | Status: DC | PRN
  Administered 2013-08-07: 50 mg via INTRAVENOUS
  Administered 2013-08-07: 150 mg via INTRAVENOUS

## 2013-08-07 MED ORDER — ONDANSETRON HCL 4 MG/2ML IJ SOLN
INTRAMUSCULAR | Status: DC | PRN
  Administered 2013-08-07: 4 mg via INTRAVENOUS

## 2013-08-07 MED ORDER — PIPERACILLIN-TAZOBACTAM IN DEX 2-0.25 GM/50ML IV SOLN
2.2500 g | Freq: Four times a day (QID) | INTRAVENOUS | Status: DC
  Administered 2013-08-07 – 2013-08-09 (×8): 2.25 g via INTRAVENOUS
  Filled 2013-08-07 (×11): qty 50

## 2013-08-07 MED ORDER — LIDOCAINE HCL (CARDIAC) 20 MG/ML IV SOLN
INTRAVENOUS | Status: DC | PRN
  Administered 2013-08-07: 100 mg via INTRAVENOUS

## 2013-08-07 MED ORDER — HYDROMORPHONE HCL PF 1 MG/ML IJ SOLN
0.2500 mg | INTRAMUSCULAR | Status: DC | PRN
  Administered 2013-08-07: 0.5 mg via INTRAVENOUS

## 2013-08-07 MED ORDER — OXYCODONE HCL 5 MG PO TABS
5.0000 mg | ORAL_TABLET | ORAL | Status: DC | PRN
  Administered 2013-08-08: 5 mg via ORAL
  Filled 2013-08-07: qty 1

## 2013-08-07 MED ORDER — FENTANYL CITRATE 0.05 MG/ML IJ SOLN
INTRAMUSCULAR | Status: DC | PRN
  Administered 2013-08-07 (×4): 25 ug via INTRAVENOUS

## 2013-08-07 MED ORDER — 0.9 % SODIUM CHLORIDE (POUR BTL) OPTIME
TOPICAL | Status: DC | PRN
  Administered 2013-08-07: 3000 mL

## 2013-08-07 MED ORDER — DIPHENHYDRAMINE HCL 12.5 MG/5ML PO ELIX: 25.0000 mg | ORAL_SOLUTION | ORAL | Status: DC | PRN

## 2013-08-07 MED ORDER — HYDROMORPHONE HCL PF 1 MG/ML IJ SOLN
INTRAMUSCULAR | Status: AC
  Filled 2013-08-07: qty 1

## 2013-08-07 MED ORDER — FUROSEMIDE 40 MG PO TABS
40.0000 mg | ORAL_TABLET | Freq: Every day | ORAL | Status: DC
  Administered 2013-08-08: 40 mg via ORAL
  Filled 2013-08-07 (×2): qty 1

## 2013-08-07 MED ORDER — FENTANYL CITRATE 0.05 MG/ML IJ SOLN
INTRAMUSCULAR | Status: AC
  Filled 2013-08-07: qty 2

## 2013-08-07 SURGICAL SUPPLY — 36 items
BAG SPEC THK2 15X12 ZIP CLS (MISCELLANEOUS)
BAG ZIPLOCK 12X15 (MISCELLANEOUS) ×1 IMPLANT
BANDAGE ESMARK 6X9 LF (GAUZE/BANDAGES/DRESSINGS) ×1 IMPLANT
BLADE MIC 41X13 (BLADE) ×1 IMPLANT
BLADE SURG CARB STEEL SZ22 (BLADE) ×3 IMPLANT
BNDG CMPR 9X6 STRL LF SNTH (GAUZE/BANDAGES/DRESSINGS) ×1
BNDG COHESIVE 3X5 TAN STRL LF (GAUZE/BANDAGES/DRESSINGS) ×2 IMPLANT
BNDG COHESIVE 6X5 TAN STRL LF (GAUZE/BANDAGES/DRESSINGS) ×2 IMPLANT
BNDG ESMARK 6X9 LF (GAUZE/BANDAGES/DRESSINGS) ×2
CLOTH BEACON ORANGE TIMEOUT ST (SAFETY) ×2 IMPLANT
CUFF TOURN SGL QUICK 34 (TOURNIQUET CUFF)
CUFF TRNQT CYL 34X4X40X1 (TOURNIQUET CUFF) ×1 IMPLANT
DRAPE LG THREE QUARTER DISP (DRAPES) ×2 IMPLANT
DRAPE SURG 17X11 SM STRL (DRAPES) ×2 IMPLANT
DRAPE U-SHAPE 47X51 STRL (DRAPES) ×2 IMPLANT
DRSG ADAPTIC 3X8 NADH LF (GAUZE/BANDAGES/DRESSINGS) ×2 IMPLANT
DURAPREP 26ML APPLICATOR (WOUND CARE) ×2 IMPLANT
ELECT REM PT RETURN 9FT ADLT (ELECTROSURGICAL) ×2
ELECTRODE REM PT RTRN 9FT ADLT (ELECTROSURGICAL) ×1 IMPLANT
GLOVE BIO SURGEON STRL SZ7.5 (GLOVE) ×2 IMPLANT
GLOVE INDICATOR 8.0 STRL GRN (GLOVE) ×2 IMPLANT
GOWN PREVENTION PLUS XLARGE (GOWN DISPOSABLE) ×4 IMPLANT
IV NS IRRIG 3000ML ARTHROMATIC (IV SOLUTION) ×1 IMPLANT
KIT BASIN OR (CUSTOM PROCEDURE TRAY) ×2 IMPLANT
MANIFOLD NEPTUNE II (INSTRUMENTS) ×1 IMPLANT
NS IRRIG 1000ML POUR BTL (IV SOLUTION) ×2 IMPLANT
PACK LOWER EXTREMITY WL (CUSTOM PROCEDURE TRAY) ×2 IMPLANT
PAD CAST 4YDX4 CTTN HI CHSV (CAST SUPPLIES) ×1 IMPLANT
PADDING CAST COTTON 4X4 STRL (CAST SUPPLIES) ×2
POSITIONER SURGICAL ARM (MISCELLANEOUS) ×2 IMPLANT
SET CYSTO W/LG BORE CLAMP LF (SET/KITS/TRAYS/PACK) ×1 IMPLANT
SPONGE GAUZE 4X4 12PLY (GAUZE/BANDAGES/DRESSINGS) ×4 IMPLANT
STOCKINETTE 8 INCH (MISCELLANEOUS) ×2 IMPLANT
SUCTION FRAZIER TIP 10 FR DISP (SUCTIONS) ×2 IMPLANT
SUT ETHILON 2 0 PSLX (SUTURE) ×4 IMPLANT
WATER STERILE IRR 1500ML POUR (IV SOLUTION) ×1 IMPLANT

## 2013-08-07 NOTE — Progress Notes (Signed)
meds given to pt's daughter to take home.

## 2013-08-07 NOTE — Op Note (Signed)
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 Incision and drainage of left foot. [id number] 2. Transmetatarsal amputation of the left foot. CPT [phone]  Surgeon: N. Eduard Roux, MD  Anesthesia: General  Findings:  1. Gangrene of the left second toe  2. infectious tenosynovitis of the second toe tracking up the plantar flexor tendons 3. osteomyelitis of the first second third and fourth metatarsals  Estimated blood loss: minimal  Complications: None  Condition PACU: Stable  Indications for procedure: Ms. Deidre Ala is a 63 year old female with diabetes who is seen in consultation yesterday for her left second toe gangrene. Given the appearance of the toe and the previous vascular studies, the decision was made to recommend operative treatment. The risks, benefits, and alternatives to surgery were discussed with the patient the patient agreed to proceed with surgery.  Description of procedure:  Ms. Deidre Ala was identified in the holding room. She was brought back to the operating room table. She was placed supine on the table. General anesthesia was administered. Left lower extremity was prepped and draped in standard sterile fashion. Her vancomycin and Zosyn were given at the regularly scheduled intervals. A calf Esmarch tourniquet was placed. We first began with the intention of performing a second ray resection. The racquet-type incision was used to get down to the bone. Once the ray resection was performed examination of the foot revealed that the infection was much more extensive than previously believed. At that time the incision was made to perform the transmetatarsal amputation given the extensive osteomyelitis of her metatarsals. There was a large amount of purulent drainage with infectious  tenosynovitis and fat necrosis that was in the foot. There was purulent drainage tracking up the flexor tendons of the foot. A fishmouth incision was used to perform the transmetatarsal amputation. A long plantar flap was used.  The metatarsals were exposed by raising full-thickness flaps were raised off of the bone.  An oscillating saw was used to perform the transmetatarsal amputation. The amputation levels were given a natural cascade from the great toe to the little toe. The wound was debrided of additional suspicious looking tissue. This was sharply excised with rongeur and knife. Once this was done 3 L of normal saline was irrigated through the wound using cystoscopy tubing.  The wound was closed loosely with 3-0 nylon sutures. A sterile dressing was applied at the end of the procedure. The patient awoke from anesthesia uneventfully. The patient was transported to the PACU.  Disposition:  patient will return back to her room. The plan is to bring her back in 48 hours on Sunday for a second look. If the infection persists and is determined to involve a more proximal level, the patient may require a below the knee amputation. She is to be nonweightbearing to the left lower extremity.

## 2013-08-07 NOTE — Preoperative (Signed)
Beta Blockers   Reason not to administer Beta Blockers:Not Applicable 

## 2013-08-07 NOTE — Progress Notes (Signed)
Temp was 103.2 tonight, down to 102 with tylenol. Notified NP, pt already had cultures and is on antibiotics.

## 2013-08-07 NOTE — Anesthesia Preprocedure Evaluation (Addendum)
Anesthesia Evaluation  Patient identified by MRN, date of birth, ID band Patient awake    Reviewed: Allergy & Precautions, H&P , NPO status , Patient's Chart, lab work & pertinent test results  Airway Mallampati: II TM Distance: >3 FB Neck ROM: Full    Dental no notable dental hx.    Pulmonary neg pulmonary ROS,  breath sounds clear to auscultation  Pulmonary exam normal       Cardiovascular hypertension, Pt. on medications and Pt. on home beta blockers + CAD, + Past MI and + Peripheral Vascular Disease Rhythm:Regular Rate:Normal  Cardiology note from 06-22-13 reviewed.   Neuro/Psych  Headaches, Neurology note from 06-22-13 reviewed. Of note, Hgb was noted to be 7.5 on 06-08-13  Stroke April 2014  Neuromuscular disease CVA negative psych ROS   GI/Hepatic negative GI ROS, Neg liver ROS,   Endo/Other  diabetes, Type 1, Insulin DependentMorbid obesity  Renal/GU Renal Insufficiency and CRFRenal diseaseH/O acute on chronic renal failure requiring hemodialysis in the past.  negative genitourinary   Musculoskeletal negative musculoskeletal ROS (+)   Abdominal (+) + obese,   Peds negative pediatric ROS (+)  Hematology negative hematology ROS (+)   Anesthesia Other Findings   Reproductive/Obstetrics negative OB ROS                        Anesthesia Physical Anesthesia Plan  ASA: IV  Anesthesia Plan: General   Post-op Pain Management:    Induction: Intravenous  Airway Management Planned: LMA  Additional Equipment:   Intra-op Plan:   Post-operative Plan: Extubation in OR  Informed Consent: I have reviewed the patients History and Physical, chart, labs and discussed the procedure including the risks, benefits and alternatives for the proposed anesthesia with the patient or authorized representative who has indicated his/her understanding and acceptance.   Dental advisory given  Plan Discussed  with: CRNA  Anesthesia Plan Comments: (Patient with many significant comorbidities. Low hemoglobin of 6.9. Has recently seen cardiologist and neurologist, and these problems are stable. )       Anesthesia Quick Evaluation

## 2013-08-07 NOTE — Brief Op Note (Signed)
Brief Op Note  Date of Surgery: 08/07/2013  Preoperative Diagnosis: Left 2nd toe gangrene  Postoperative Diagnosis: same  Procedure: Procedure(s): left transmetatarsal amputation  Implants: none  Surgeons: Surgeon(s): Casandra Dallaire Eduard Roux, MD  Anesthesia: Choice  Drains: none  Estimated Blood Loss: minimal  Complications: None  Condition to PACU: Stable  Mayu Ronk Eduard Roux, MD [place] 08/07/2013 9:11 AM

## 2013-08-07 NOTE — Progress Notes (Signed)
CARE MANAGEMENT NOTE 08/07/2013  Patient:  [name],[name]   Account Number:  [id number]  Date Initiated:  08/07/2013  Documentation initiated by:  [facility]  Subjective/Objective Assessment:   62 year old female admitted with left 2nd toe gangrene.     Action/Plan:   From home, active with [facility] [place].   Anticipated DC Date:  08/10/2013   Anticipated DC Plan:  [place]  CM consult      Choice offered to / List presented to:       Status of service:  In process, will continue to follow Medicare Important Message given?  NA - LOS <3 / Initial given by admissions  Per UR Regulation:  Reviewed for med. necessity/level of care/duration of stay  Comments:  08/07/13 Allene Dillon RN BSN [phone] Pt was active with [facility] services, if services continue to be need, MD will need to order resumption of services.

## 2013-08-07 NOTE — Transfer of Care (Signed)
Immediate Anesthesia Transfer of Care Note  Patient: [name]  Procedure(s) Performed: Procedure(s) (LRB): left midfoot amputation (Left)  Patient Location: PACU  Anesthesia Type: General  Level of Consciousness: sedated, patient cooperative and responds to stimulation  Airway & Oxygen Therapy: Patient Spontanous Breathing and Patient connected to face mask oxgen  Post-op Assessment: Report given to PACU RN and Post -op Vital signs reviewed and stable  Post vital signs: Reviewed and stable  Complications: No apparent anesthesia complications

## 2013-08-07 NOTE — Progress Notes (Signed)
CRITICAL VALUE ALERT  Critical value received:  hgb 6.9  Date of notification:  08/07/2013   Time of notification:  0500  Critical value read back:yes  Nurse who received alert:  mk  MD notified (1st page):  Forrest Moron np  Time of first page:  Spoke with her in person at [place]  MD notified (2nd page):  Time of second page:  Responding MD:  0510  Time MD responded:  Kk, np

## 2013-08-07 NOTE — Anesthesia Postprocedure Evaluation (Signed)
  Anesthesia Post-op Note  Patient: [name]  Procedure(s) Performed: Procedure(s) (LRB): left midfoot amputation (Left)  Patient Location: PACU  Anesthesia Type: General  Level of Consciousness: awake and alert   Airway and Oxygen Therapy: Patient Spontanous Breathing  Post-op Pain: mild  Post-op Assessment: Post-op Vital signs reviewed, Patient's Cardiovascular Status Stable, Respiratory Function Stable, Patent Airway and No signs of Nausea or vomiting  Last Vitals:  Filed Vitals:   08/07/13 1251  BP: 121/67  Pulse: 76  Temp: 37 C  Resp: 17    Post-op Vital Signs: stable   Complications: No apparent anesthesia complications

## 2013-08-07 NOTE — Progress Notes (Signed)
[name] [id number] DOB: 02-16-1951 DOA: 08/06/2013 PCP: Hayden Rasmussen., MD  Brief narrative: 62 y/o ?,  Known recent R PICA infarct 07/23/13, STEMI 03/2009, CAD DM ty 2 with PAD, Prior CVA 4/14, Morbid obesity, Body mass index is 39.88 kg/(m^2)., CKD 4-5 admitted 08/06/13 with impending gangrene  L foot, with WBC 12 and drainage Underwent L lis-franc amputation 9/19 am  Past medical history-As per Problem list Chart reviewed as below-  Consultants:  Ortho  Procedures:  See above  Antibiotics:  Zosyn 9/18  Vancomycin 9/18   Subjective  Well.  Sleepy No n/v/cp Pain controlled  Objective    Interim History:   Telemetry: nad  Objective: Filed Vitals:   08/07/13 1015 08/07/13 1046 08/07/13 1201 08/07/13 1251  BP: 126/55 122/58 117/66 121/67  Pulse: 77 76 75 76  Temp: 99 F (37.2 C) 99.1 F (37.3 C) 98.9 F (37.2 C) 98.6 F (37 C)  TempSrc:   Oral Oral  Resp: 17 12 16 17   Height:      Weight:      SpO2: 100% 100% 100% 100%    Intake/Output Summary (Last 24 hours) at 08/07/13 1338 Last data filed at 08/07/13 1015  Gross per 24 hour  Intake 1347.5 ml  Output      0 ml  Net 1347.5 ml    Exam:  General: well, EOMI Cardiovascular: s1 s2 no m/r/g Respiratory: clear Abdomen: soft, NT/ND Skin no LE edema Neuro Intact and gorssly moving all 4 extremities  Data Reviewed: Basic Metabolic Panel:  Recent Labs Lab 08/06/13 1115 08/07/13 0430  NA 134* 135  K 3.8 4.3  CL 96 100  CO2 23 21  GLUCOSE 171* 262*  BUN 84* 80*  CREATININE 3.14* 3.14*  CALCIUM 9.8 9.1   Liver Function Tests:  Recent Labs Lab 08/06/13 1115  AST 10  ALT 7  ALKPHOS 125*  BILITOT 0.4  PROT 8.1  ALBUMIN 2.7*   No results found for this basename: LIPASE, AMYLASE,  in the last 168 hours No results found for this basename: AMMONIA,  in the last 168 hours CBC:  Recent Labs Lab 08/06/13 1115 08/07/13 0430  WBC 12.2* 10.1  NEUTROABS 8.8*  --   HGB 7.9* 6.9*   HCT 24.4* 21.2*  MCV 82.2 82.8  PLT 210 170   Cardiac Enzymes: No results found for this basename: CKTOTAL, CKMB, CKMBINDEX, TROPONINI,  in the last 168 hours BNP: No components found with this basename: POCBNP,  CBG:  Recent Labs Lab 08/06/13 1020 08/06/13 1319 08/06/13 2124 08/07/13 0856 08/07/13 1121  GLUCAP 201* 152* 283* 275* 290*    Recent Results (from the past 240 hour(s))  CULTURE, BLOOD (ROUTINE X 2)     Status: None   Collection Time    08/06/13 11:15 AM      Result Value Range Status   Specimen Description BLOOD LEFT ANTECUBITAL   Final   Special Requests BOTTLES DRAWN AEROBIC AND ANAEROBIC 5CC   Final   Culture  Setup Time     Final   Value: 08/06/2013 14:49     Performed at [facility]   Culture     Final   Value:        BLOOD CULTURE RECEIVED NO GROWTH TO DATE CULTURE WILL BE HELD FOR 5 DAYS BEFORE ISSUING A FINAL NEGATIVE REPORT     Performed at [facility]   Report Status PENDING   Incomplete  CULTURE, BLOOD (ROUTINE X 2)  Status: None   Collection Time    08/06/13 11:40 AM      Result Value Range Status   Specimen Description BLOOD RIGHT ANTECUBITAL   Final   Special Requests BOTTLES DRAWN AEROBIC AND ANAEROBIC 6ML   Final   Culture  Setup Time     Final   Value: 08/06/2013 14:49     Performed at [facility]   Culture     Final   Value:        BLOOD CULTURE RECEIVED NO GROWTH TO DATE CULTURE WILL BE HELD FOR 5 DAYS BEFORE ISSUING A FINAL NEGATIVE REPORT     Performed at [facility]   Report Status PENDING   Incomplete  MRSA PCR SCREENING     Status: None   Collection Time    08/07/13  6:30 AM      Result Value Range Status   MRSA by PCR NEGATIVE  NEGATIVE Final   Comment:            The GeneXpert MRSA Assay (FDA     approved for NASAL specimens     only), is one component of a     comprehensive MRSA colonization     surveillance program. It is not     intended to diagnose MRSA     infection nor to  guide or     monitor treatment for     MRSA infections.     Studies:              All Imaging reviewed and is as per above notation   Scheduled Meds: . aspirin  325 mg Oral Q24H  . atorvastatin  40 mg Oral q1800  . carvedilol  25 mg Oral BID WC  . enoxaparin (LOVENOX) injection  30 mg Subcutaneous Q24H  . ferrous sulfate  325 mg Oral Q breakfast  . [START ON 08/08/2013] furosemide  40 mg Oral BID  . HYDROmorphone      . influenza vac split quadrivalent PF  0.5 mL Intramuscular Tomorrow-1000  . insulin aspart  0-15 Units Subcutaneous TID WC  . insulin aspart  0-5 Units Subcutaneous QHS  . insulin glargine  15 Units Subcutaneous QHS  . niacin  500 mg Oral QHS  . omega-3 acid ethyl esters  1 g Oral TID  . piperacillin-tazobactam (ZOSYN)  IV  2.25 g Intravenous Q6H  . pneumococcal 23 valent vaccine  0.5 mL Intramuscular Tomorrow-1000  . senna  1 tablet Oral BID  . vancomycin  1,500 mg Intravenous Q48H   Continuous Infusions: . sodium chloride 50 mL/hr at 08/07/13 0943     Assessment/Plan: 1. S/p Transmet Amputation 9/19-Ortho to address  Pain management, Empiric antibiotics and coverage, weight bearing.  1. DM ty 2-Blood sugars 275-290.  Continue Lantus 15 daily, SSI coverage-might need to increase 2. H/o CAD-Asa 325 daily, Cont Coreg 25 bid 3. Hld-Cont Lipitor 40 daily, Niacin 500 mg daily, Lovaza 1 g tid 4. CHF-Euvolemic.  Cont Lasix 40 bid. 5. CKD stage 4-5-Still makes urine.  Cut back lasix to 40 once daily-Follow Dr. Moshe Cipro on d/c 6. PAD-Sees [facility] for this.  NO further cardiac issues presently.  OP follow up Dr. Ellyn Hack   Code Status: Full  Family Communication: spoke with both daughters today Disposition Plan: med-surg   Verneita Griffes, MD  [facility] Pager [phone] 08/07/2013, 1:38 PM    LOS: 1 day

## 2013-08-07 NOTE — Progress Notes (Signed)
Dr. Delma Post in- made aware of patient's temperature and CBG results in PACU- Insulin coverage to begin on floor.

## 2013-08-08 LAB — IRON AND TIBC
Iron: 24 ug/dL — ABNORMAL LOW (ref 42–135)
TIBC: 164 ug/dL — ABNORMAL LOW (ref 250–470)

## 2013-08-08 LAB — CBC WITH DIFFERENTIAL/PLATELET
Basophils Absolute: 0 10*3/uL (ref 0.0–0.1)
Basophils Relative: 0 % (ref 0–1)
Eosinophils Absolute: 0.1 10*3/uL (ref 0.0–0.7)
MCH: 26.4 pg (ref 26.0–34.0)
MCHC: 31.3 g/dL (ref 30.0–36.0)
Neutro Abs: 6.4 10*3/uL (ref 1.7–7.7)
Neutrophils Relative %: 68 % (ref 43–77)
Platelets: 164 10*3/uL (ref 150–400)

## 2013-08-08 LAB — RENAL FUNCTION PANEL
Albumin: 2.1 g/dL — ABNORMAL LOW (ref 3.5–5.2)
GFR calc Af Amer: 15 mL/min — ABNORMAL LOW (ref >90)
GFR calc non Af Amer: 13 mL/min — ABNORMAL LOW (ref >90)
Phosphorus: 5.4 mg/dL — ABNORMAL HIGH (ref 2.3–4.6)
Potassium: 4.2 mEq/L (ref 3.5–5.1)
Sodium: 131 mEq/L — ABNORMAL LOW (ref 135–145)

## 2013-08-08 LAB — GLUCOSE, CAPILLARY
Glucose-Capillary: 227 mg/dL — ABNORMAL HIGH (ref 70–99)
Glucose-Capillary: 269 mg/dL — ABNORMAL HIGH (ref 70–99)
Glucose-Capillary: 295 mg/dL — ABNORMAL HIGH (ref 70–99)

## 2013-08-08 LAB — RETICULOCYTES
Retic Count, Absolute: 36.5 10*3/uL (ref 19.0–186.0)
Retic Ct Pct: 1.5 % (ref 0.4–3.1)

## 2013-08-08 LAB — PREPARE RBC (CROSSMATCH)

## 2013-08-08 LAB — CREATININE, SERUM: Creatinine, Ser: 3.53 mg/dL — ABNORMAL HIGH (ref 0.50–1.10)

## 2013-08-08 MED ORDER — DIPHENHYDRAMINE HCL 25 MG PO CAPS
25.0000 mg | ORAL_CAPSULE | Freq: Once | ORAL | Status: AC
  Administered 2013-08-08: 25 mg via ORAL
  Filled 2013-08-08: qty 1

## 2013-08-08 MED ORDER — POLYETHYLENE GLYCOL 3350 17 G PO PACK
17.0000 g | PACK | Freq: Every day | ORAL | Status: DC
  Administered 2013-08-08 – 2013-08-10 (×2): 17 g via ORAL
  Filled 2013-08-08 (×4): qty 1

## 2013-08-08 MED ORDER — INSULIN GLARGINE 100 UNIT/ML ~~LOC~~ SOLN
10.0000 [IU] | Freq: Every day | SUBCUTANEOUS | Status: DC
  Administered 2013-08-08 – 2013-08-12 (×5): 10 [IU] via SUBCUTANEOUS
  Filled 2013-08-08 (×7): qty 0.1

## 2013-08-08 MED ORDER — FUROSEMIDE 10 MG/ML IJ SOLN
20.0000 mg | Freq: Once | INTRAMUSCULAR | Status: AC
  Administered 2013-08-08: 20 mg via INTRAVENOUS
  Filled 2013-08-08: qty 2

## 2013-08-08 MED ORDER — ACETAMINOPHEN 325 MG PO TABS
650.0000 mg | ORAL_TABLET | Freq: Once | ORAL | Status: AC
  Administered 2013-08-08: 650 mg via ORAL
  Filled 2013-08-08: qty 2

## 2013-08-08 NOTE — Evaluation (Signed)
Occupational Therapy Evaluation Patient Details Name: [name] MRN: [id number] DOB: Sep 08, 1951 Today's Date: 08/08/2013 Time: [id number] OT Time Calculation (min): 21 min  OT Assessment / Plan / Recommendation History of present illness 62 yo female h/o dm, pvd, cad comes in with fighting left toe infection for weeks; Relatively recent CVA affecting right side. 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 Pt NWB LLE   Clinical Impression    Pt presents to OT with decreased I with ADL activity and will benefit from skilled OT to address problems listed below  OT Assessment  Patient needs continued OT Services    Follow Up Recommendations  SNF;Home health OT;Supervision/Assistance - 24 hour;Other (comment) (depending on progress)             Frequency  Min 2X/week    Precautions / Restrictions Restrictions Weight Bearing Restrictions: Yes LLE Weight Bearing: Non weight bearing       ADL  Grooming: Minimal assistance Where Assessed - Grooming: Unsupported sitting Upper Body Bathing: Minimal assistance Where Assessed - Upper Body Bathing: Unsupported sitting Lower Body Bathing: +2 Total assistance Lower Body Bathing: Patient Percentage: 40% Where Assessed - Lower Body Bathing: Supported sit to stand Upper Body Dressing: Minimal assistance Where Assessed - Upper Body Dressing: Unsupported sitting Lower Body Dressing: +2 Total assistance Lower Body Dressing: Patient Percentage: 50% Where Assessed - Lower Body Dressing: Supported sit to Tree surgeon Transfer: +2 Total assistance Toilet Transfer: Patient Percentage: 50% Toilet Transfer Method: Sit to stand;Stand pivot    OT Diagnosis: Generalized weakness;Acute pain  OT Problem List: Decreased strength;Decreased activity tolerance;Pain;Decreased safety awareness;Obesity;Decreased knowledge of precautions OT Treatment Interventions: Self-care/ADL training;Patient/family education;DME and/or AE instruction   OT Goals(Current  goals can be found in the care plan section) Acute Rehab OT Goals OT Goal Formulation: With patient Time For Goal Achievement: 08/22/13 Potential to Achieve Goals: Good ADL Goals Pt Will Perform Grooming: with supervision;standing Pt Will Perform Lower Body Bathing: with supervision;sit to/from stand Pt Will Perform Lower Body Dressing: with supervision;sit to/from stand Pt Will Transfer to Toilet: with supervision;bedside commode Pt Will Perform Toileting - Clothing Manipulation and hygiene: with supervision;sit to/from stand  Visit Information  Last OT Received On: 08/08/13 History of Present Illness: 62 yo female h/o dm, pvd, cad comes in with fighting left toe infection for weeks; Relatively recent CVA affecting right side. 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 Pt NWB LLE       Prior Functioning     Home Living Family/patient expects to be discharged to:: Private residence Living Arrangements: Children Available Help at Discharge: Family Type of Home: House Home Access: Stairs to enter [employer] of Steps: 4 at side entrance  [place]: Two level;Bed/bath upstairs Alternate Level Stairs-Number of Steps: [place]: [place] - 2 wheels;Bedside commode Communication Communication: No difficulties Dominant Hand: Right         Vision/Perception Vision - History Patient Visual Report: No change from baseline   Cognition  Cognition Arousal/Alertness: Awake/alert Behavior During Therapy: Flat affect Overall Cognitive Status: Within Functional Limits for tasks assessed    Extremity/Trunk Assessment Upper Extremity Assessment Upper Extremity Assessment: Overall WFL for tasks assessed     Mobility Bed Mobility Bed Mobility: Supine to Sit Supine to Sit: 3: Mod assist Transfers Transfers: Sit to Stand;Stand to Sit Sit to Stand: From bed;1: +2 Total assist;With upper extremity assist Sit to Stand: Patient Percentage: 50% Stand to Sit: To  chair/3-in-1;1: +2 Total assist;With upper extremity assist Stand to Sit: Patient Percentage:  50%           End of Session OT - End of Session Activity Tolerance: Patient tolerated treatment well Patient left: in chair Nurse Communication: Mobility status  GO     Betsy Pries 08/08/2013, 11:06 AM

## 2013-08-08 NOTE — Progress Notes (Signed)
Clinical Social Work Department BRIEF PSYCHOSOCIAL ASSESSMENT 08/08/2013  Patient:  [name],[name]     Account Number:  [id number]     Admit date:  08/06/2013  Clinical Social Worker:  Levie Heritage  Date/Time:  08/08/2013 04:04 PM  Referred by:  Physician  Date Referred:  08/08/2013 Referred for  SNF Placement   Other Referral:   Interview type:  Patient Other interview type:   [name], [name], at bedside    PSYCHOSOCIAL DATA Living Status:  FAMILY Admitted from facility:   Level of care:   Primary support name:  [name] Primary support relationship to patient:  CHILD, ADULT Degree of support available:   strong    CURRENT CONCERNS Current Concerns  Post-Acute Placement   Other Concerns:    SOCIAL WORK ASSESSMENT / PLAN Met with Pt and daughter to discuss d/c plans.    Pt aware that PT recommended SNF and is agreeable to plan.    Pt stated that she has been to [facility] in the past and is interested in exploring other facilities, although she'd like to stay close to home.    Pt gave CSW permission to send her information to [facility].    CSW thanked Pt and her daughter for their time.   Assessment/plan status:  Psychosocial Support/Ongoing Assessment of Needs Other assessment/ plan:   Information/referral to community resources:   SNF list    PATIENT'S/FAMILY'S RESPONSE TO PLAN OF CARE: Pt is aware that she needs SNF and is ok with this d/c plan.    Pt and daughter thanked CSW for time and assistance.   Bernita Raisin, [place] Work [phone]

## 2013-08-08 NOTE — Progress Notes (Addendum)
Clinical Social Work Department CLINICAL SOCIAL WORK PLACEMENT NOTE 08/08/2013  Patient:  [name], [name]  Account Number:  [id number] Admit date:  08/06/2013  Clinical Social Worker:  Levie Heritage  Date/time:  08/08/2013 04:10 PM  Clinical Social Work is seeking post-discharge placement for this patient at the following level of care:   [place]   (*CSW will update this form in Epic as items are completed)   08/08/2013  Patient/family provided with [place] Department of Clinical Social Work's list of facilities offering this level of care within the geographic area requested by the patient (or if unable, by the patient's family).  08/08/2013  Patient/family informed of their freedom to choose among providers that offer the needed level of care, that participate in Medicare, Medicaid or managed care program needed by the patient, have an available bed and are willing to accept the patient.  08/08/2013  Patient/family informed of [facility]' ownership interest in [facility], as well as of the fact that they are under no obligation to receive care at this facility.  PASARR submitted to EDS on 03/30/2013 PASARR number received from EDS on 03/30/2013  FL2 transmitted to all facilities in geographic area requested by pt/family on  08/08/2013 FL2 transmitted to all facilities within larger geographic area on   Patient informed that his/her managed care company has contracts with or will negotiate with  certain facilities, including the following:     Patient/family informed of bed offers received: 08/10/13  Patient chooses bed at [facility] Physician recommends and patient chooses bed at    Patient to be transferred to  on   Patient to be transferred to facility by   The following physician request were entered in Epic:   Additional Comments:  Bernita Raisin, [place] Work [phone]

## 2013-08-08 NOTE — Progress Notes (Signed)
Subjective:  Patient reports pain as mild.  No events  Objective:   VITALS:   Filed Vitals:   08/07/13 1440 08/07/13 1736 08/07/13 2200 08/08/13 0600  BP: 154/74 131/78 124/62 122/58  Pulse: 79 77 87   Temp: 98.6 F (37 C) 98.3 F (36.8 C) 103.1 F (39.5 C) 98.2 F (36.8 C)  TempSrc: Oral Oral Oral Oral  Resp: 20 18 20 20   Height:      Weight:      SpO2: 91% 100% 91% 100%    Intact pulses distally Dorsiflexion/Plantar flexion intact Incision: dressing C/D/I and no drainage No cellulitis present Compartment soft   Lab Results  Component Value Date   WBC 10.1 08/07/2013   HGB 6.9* 08/07/2013   HCT 21.2* 08/07/2013   MCV 82.8 08/07/2013   PLT 170 08/07/2013     Assessment/Plan: 1 Day Post-Op   Problem List Items Addressed This Visit   Type II or unspecified type diabetes mellitus with renal manifestations, 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)   Relevant Medications      Insulin Glargine (LANTUS SOLOSTAR) 100 UNIT/ML SOPN      aspirin 325 MG tablet      aspirin tablet 325 mg      atorvastatin (LIPITOR) tablet 40 mg      insulin aspart (novoLOG) injection 0-15 Units      insulin aspart (novoLOG) injection 0-5 Units      insulin glargine (LANTUS) injection 15 Units   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   Relevant Medications      Insulin Glargine (LANTUS SOLOSTAR) 100 UNIT/ML SOPN      aspirin 325 MG tablet      aspirin tablet 325 mg      atorvastatin (LIPITOR) tablet 40 mg      insulin aspart (novoLOG) injection 0-15 Units      insulin aspart (novoLOG) injection 0-5 Units      insulin glargine (LANTUS) injection 15 Units   Foot infection   Relevant Medications      influenza vac split quadrivalent PF (FLUARIX) injection 0.5 mL      pneumococcal 23 valent vaccine (PNU-IMMUNE) injection 0.5 mL    Other Visit Diagnoses   Toe gangrene    -  Primary    PVD (peripheral vascular disease)        Relevant Medications       aspirin 325 MG tablet       carvedilol  (COREG) 25 MG tablet       carvedilol (COREG) tablet 25 mg       aspirin tablet 325 mg       atorvastatin (LIPITOR) tablet 40 mg       omega-3 acid ethyl esters (LOVAZA) capsule 1 g       enoxaparin (LOVENOX) injection 30 mg       furosemide (LASIX) tablet 40 mg (Start on 08/08/2013 10:00 AM)    DM type 2, uncontrolled, with lower extremity ulcer        Relevant Medications       Insulin Glargine (LANTUS SOLOSTAR) 100 UNIT/ML SOPN       aspirin 325 MG tablet       aspirin tablet 325 mg       atorvastatin (LIPITOR) tablet 40 mg       insulin aspart (novoLOG) injection 0-15 Units       insulin aspart (novoLOG) injection 0-5 Units       insulin glargine (LANTUS) injection 15 Units  Advance diet Up with therapy Pain control Plan to return to OR tomorrow for I&D and possible BKA Continue empiric abx coverage per primary team   Marianna Payment 08/08/2013, 8:26 AM [phone]

## 2013-08-08 NOTE — Progress Notes (Signed)
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  Doing fair.  No n/v/cp/sob Pain is mild    Objective    Interim History:   Telemetry: nad  Objective: Filed Vitals:   08/07/13 1440 08/07/13 1736 08/07/13 2200 08/08/13 0600  BP: 154/74 131/78 124/62 122/58  Pulse: 79 77 87   Temp: 98.6 F (37 C) 98.3 F (36.8 C) 103.1 F (39.5 C) 98.2 F (36.8 C)  TempSrc: Oral Oral Oral Oral  Resp: 20 18 20 20   Height:      Weight:      SpO2: 91% 100% 91% 100%    Intake/Output Summary (Last 24 hours) at 08/08/13 1344 Last data filed at 08/08/13 0600  Gross per 24 hour  Intake 1137.5 ml  Output      0 ml  Net 1137.5 ml    Exam:  General: well, EOMI Cardiovascular: s1 s2 no m/r/g Respiratory: clear Abdomen: soft, NT/ND Skin no LE edema Neuro Intact and gorssly moving all 4 extremities  Data Reviewed: Basic Metabolic Panel:  Recent Labs Lab 08/06/13 1115 08/07/13 0430 08/08/13 0558  NA 134* 135  --   K 3.8 4.3  --   CL 96 100  --   CO2 23 21  --   GLUCOSE 171* 262*  --   BUN 84* 80*  --   CREATININE 3.14* 3.14* 3.53*  CALCIUM 9.8 9.1  --    Liver Function Tests:  Recent Labs Lab 08/06/13 1115  AST 10  ALT 7  ALKPHOS 125*  BILITOT 0.4  PROT 8.1  ALBUMIN 2.7*   No results found for this basename: LIPASE, AMYLASE,  in the last  168 hours No results found for this basename: AMMONIA,  in the last 168 hours CBC:  Recent Labs Lab 08/06/13 1115 08/07/13 0430  WBC 12.2* 10.1  NEUTROABS 8.8*  --   HGB 7.9* 6.9*  HCT 24.4* 21.2*  MCV 82.2 82.8  PLT 210 170   Cardiac Enzymes: No results found for this basename: CKTOTAL, CKMB, CKMBINDEX, TROPONINI,  in the last 168 hours BNP: No components found with this basename: POCBNP,  CBG:  Recent Labs Lab 08/07/13 1121 08/07/13 1642 08/07/13 2138 08/08/13 0743 08/08/13 1135  GLUCAP 290* 191* 218* 227* 269*    Recent Results (from the past 240 hour(s))  CULTURE, BLOOD (ROUTINE X 2)     Status: None   Collection Time    08/06/13 11:15 AM      Result Value Range Status   Specimen Description BLOOD LEFT ANTECUBITAL   Final   Special Requests BOTTLES DRAWN AEROBIC AND ANAEROBIC 5CC   Final   Culture  Setup Time     Final   Value: 08/06/2013 14:49     Performed at [facility]   Culture     Final   Value:  BLOOD CULTURE RECEIVED NO GROWTH TO DATE CULTURE WILL BE HELD FOR 5 DAYS BEFORE ISSUING A FINAL NEGATIVE REPORT     Performed at [facility]   Report Status PENDING   Incomplete  CULTURE, BLOOD (ROUTINE X 2)     Status: None   Collection Time    08/06/13 11:40 AM      Result Value Range Status   Specimen Description BLOOD RIGHT ANTECUBITAL   Final   Special Requests BOTTLES DRAWN AEROBIC AND ANAEROBIC 6ML   Final   Culture  Setup Time     Final   Value: 08/06/2013 14:49     Performed at [facility]   Culture     Final   Value:        BLOOD CULTURE RECEIVED NO GROWTH TO DATE CULTURE WILL BE HELD FOR 5 DAYS BEFORE ISSUING A FINAL NEGATIVE REPORT     Performed at [facility]   Report Status PENDING   Incomplete  MRSA PCR SCREENING     Status: None   Collection Time    08/07/13  6:30 AM      Result Value Range Status   MRSA by PCR NEGATIVE  NEGATIVE Final   Comment:            The GeneXpert MRSA Assay (FDA      approved for NASAL specimens     only), is one component of a     comprehensive MRSA colonization     surveillance program. It is not     intended to diagnose MRSA     infection nor to guide or     monitor treatment for     MRSA infections.     Studies:              All Imaging reviewed and is as per above notation   Scheduled Meds: . aspirin  325 mg Oral Q24H  . atorvastatin  40 mg Oral q1800  . carvedilol  25 mg Oral BID WC  . enoxaparin (LOVENOX) injection  30 mg Subcutaneous Q24H  . ferrous sulfate  325 mg Oral Q breakfast  . furosemide  40 mg Oral Daily  . insulin aspart  0-15 Units Subcutaneous TID WC  . insulin aspart  0-5 Units Subcutaneous QHS  . insulin glargine  15 Units Subcutaneous QHS  . niacin  500 mg Oral QHS  . omega-3 acid ethyl esters  1 g Oral TID  . piperacillin-tazobactam (ZOSYN)  IV  2.25 g Intravenous Q6H  . polyethylene glycol  17 g Oral Daily  . senna  1 tablet Oral BID  . vancomycin  1,500 mg Intravenous Q48H   Continuous Infusions: . sodium chloride 50 mL/hr at 08/07/13 0943     Assessment/Plan: 1. S/p Transmet Amputation 9/19-Ortho to address  Pain management, Empiric antibiotics and coverage, weight bearing.   1. Anemia-add stat anemia panel-we'll transfuse one unit packed red blood cells 2. DM ty 2-Blood sugars 275-290.  Increased Lantus 15 daily-->20, SSI coverage moderate-to 1/2 dose of lantus prior to surgery 9/21.  Might need q 4 coverage 3. H/o CAD-Asa 325 daily, Cont Coreg 25 bid 4. Hld-Cont Lipitor 40 daily, Niacin 500 mg daily, Lovaza 1 g tid 5. CHF-Euvolemic.  Cont Lasix 40 bid. 6. CKD stage 4-5-Still makes urine.  Cut back lasix to 40 once daily-Follow Dr. Moshe Cipro on d/cPAD-Sees [facility] for this.  Nofurther cardiac issues presently.  OP follow up Dr. Ellyn Hack   Code Status:  Full  Family Communication: spoke with both daughters today Disposition Plan: med-surg   Verneita Griffes, MD  [facility] Pager  [phone] 08/08/2013, 1:44 PM    LOS: 2 days

## 2013-08-08 NOTE — Evaluation (Signed)
Physical Therapy Evaluation Patient Details Name: [name] MRN: [id number] DOB: 01/19/1951 Today's Date: 08/08/2013 Time: [id number] PT Time Calculation (min): 21 min  PT Assessment / Plan / Recommendation History of Present Illness  62 yo female h/o dm, pvd, cad comes in with fighting left toe infection for weeks; Relatively recent CVA affecting right side. 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 Pt NWB LLE. Surggical site not closed yet, to return to surgery for possible BKA on 9/21.  Clinical Impression  Pt did participate in transfers to recliner, requiring support of L leg to prevent weight. Pt will benefit from PT while in acute care to improve safe transfers, instruct in NWB status. Pt will benefit from SNF at [facility] as her home has steps and 2 levels.    PT Assessment  Patient needs continued PT services    Follow Up Recommendations  SNF    Does the patient have the potential to tolerate intense rehabilitation      Barriers to Discharge Inaccessible home environment pt has steps to enter and bed/bath second floor.    Equipment Recommendations  None recommended by PT    Recommendations for Other Services     Frequency Min 3X/week    Precautions / Restrictions Precautions Precautions: Fall Restrictions Weight Bearing Restrictions: Yes LLE Weight Bearing: Non weight bearing   Pertinent Vitals/Pain L foot not rated.  Declined meds.      Mobility  Bed Mobility Bed Mobility: Supine to Sit Supine to Sit: 3: Mod assist Transfers Transfers: Stand Pivot Transfers Sit to Stand: From bed;1: +2 Total assist;With upper extremity assist Sit to Stand: Patient Percentage: 50% Stand to Sit: To chair/3-in-1;1: +2 Total assist;With upper extremity assist Stand to Sit: Patient Percentage: 50% Stand Pivot Transfers: 1: +2 Total assist Stand Pivot Transfers: Patient Percentage: 60% Details for Transfer Assistance: pt did scoot on R foot to turn to get to recliner, did not  hop/take a step. Ambulation/Gait Ambulation/Gait Assistance: Not tested (comment)    Exercises     PT Diagnosis: Difficulty walking;Generalized weakness;Acute pain  PT Problem List: Decreased strength;Decreased activity tolerance;Decreased range of motion;Decreased mobility;Decreased knowledge of use of DME;Decreased safety awareness;Decreased knowledge of precautions PT Treatment Interventions: DME instruction;Gait training;Functional mobility training;Therapeutic activities;Therapeutic exercise;Patient/family education     PT Goals(Current goals can be found in the care plan section) Acute Rehab PT Goals Patient Stated Goal: agreed to getting OOB PT Goal Formulation: With patient/family Time For Goal Achievement: 08/22/13 Potential to Achieve Goals: Good  Visit Information  Last PT Received On: 08/08/13 Assistance Needed: +2 History of Present Illness: 62 yo female h/o dm, pvd, cad comes in with fighting left toe infection for weeks; Relatively recent CVA affecting right side. 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 Pt NWB LLE. Surggical site not closed yet, to return to surgery for possible BKA on 9/21.       Prior [place] expects to be discharged to:: Private residence Living Arrangements: Children Available Help at Discharge: Family Type of Home: House Home Access: Stairs to enter [employer] of Steps: 4 at side entrance  [place]: Two level;Bed/bath upstairs Alternate Level Stairs-Number of Steps: [place]: [place] - 2 wheels;Bedside commode Communication Communication: No difficulties Dominant Hand: Right    Cognition  Cognition Arousal/Alertness: Lethargic;Awake/alert Behavior During Therapy: Flat affect Overall Cognitive Status: Within Functional Limits for tasks assessed    Extremity/Trunk Assessment Upper Extremity Assessment Upper Extremity Assessment: Overall WFL for tasks assessed Lower Extremity  Assessment Lower  Extremity Assessment: RLE deficits/detail;LLE deficits/detail RLE Sensation: history of peripheral neuropathy LLE Deficits / Details: able to move leg over edge, PT held leg up during transfer.   Balance    End of Session PT - End of Session Activity Tolerance: Patient limited by fatigue;Patient limited by lethargy Patient left: in chair;with call bell/phone within reach;with family/visitor present Nurse Communication: Mobility status  GP     Claretha Cooper 08/08/2013, 12:39 PM

## 2013-08-09 ENCOUNTER — Encounter (HOSPITAL_COMMUNITY)
Admission: EM | Disposition: A | Discharge status: Skilled Nursing Facility | Payer: Self-pay | Source: Home / Self Care | Attending: Family Medicine

## 2013-08-09 ENCOUNTER — Inpatient Hospital Stay (HOSPITAL_COMMUNITY): Payer: Medicare Other | Admitting: Anesthesiology

## 2013-08-09 ENCOUNTER — Encounter (HOSPITAL_COMMUNITY): Payer: Self-pay | Admitting: Anesthesiology

## 2013-08-09 HISTORY — PX: AMPUTATION: SHX166

## 2013-08-09 LAB — CBC WITH DIFFERENTIAL/PLATELET
Basophils Absolute: 0 10*3/uL (ref 0.0–0.1)
Eosinophils Absolute: 0.3 10*3/uL (ref 0.0–0.7)
HCT: 21 % — ABNORMAL LOW (ref 36.0–46.0)
Hemoglobin: 6.8 g/dL — CL (ref 12.0–15.0)
Lymphocytes Relative: 23 % (ref 12–46)
MCHC: 32.4 g/dL (ref 30.0–36.0)
Monocytes Absolute: 1.1 10*3/uL — ABNORMAL HIGH (ref 0.1–1.0)
Monocytes Relative: 11 % (ref 3–12)
Neutrophils Relative %: 63 % (ref 43–77)
Platelets: 199 10*3/uL (ref 150–400)
RDW: 16.7 % — ABNORMAL HIGH (ref 11.5–15.5)
WBC: 10.2 10*3/uL (ref 4.0–10.5)

## 2013-08-09 LAB — VITAMIN B12: Vitamin B-12: 342 pg/mL (ref 211–911)

## 2013-08-09 LAB — FOLATE: Folate: 9.1 ng/mL

## 2013-08-09 LAB — GLUCOSE, CAPILLARY
Glucose-Capillary: 242 mg/dL — ABNORMAL HIGH (ref 70–99)
Glucose-Capillary: 174 mg/dL — ABNORMAL HIGH (ref 70–99)
Glucose-Capillary: 188 mg/dL — ABNORMAL HIGH (ref 70–99)
Glucose-Capillary: 240 mg/dL — ABNORMAL HIGH (ref 70–99)
Glucose-Capillary: 294 mg/dL — ABNORMAL HIGH (ref 70–99)

## 2013-08-09 LAB — RENAL FUNCTION PANEL
Albumin: 2.1 g/dL — ABNORMAL LOW (ref 3.5–5.2)
CO2: 22 mEq/L (ref 19–32)
Calcium: 8.9 mg/dL (ref 8.4–10.5)
GFR calc Af Amer: 14 mL/min — ABNORMAL LOW (ref >90)
GFR calc non Af Amer: 12 mL/min — ABNORMAL LOW (ref >90)
Glucose, Bld: 183 mg/dL — ABNORMAL HIGH (ref 70–99)
Phosphorus: 4.9 mg/dL — ABNORMAL HIGH (ref 2.3–4.6)
Potassium: 4.1 mEq/L (ref 3.5–5.1)
Sodium: 129 mEq/L — ABNORMAL LOW (ref 135–145)

## 2013-08-09 SURGERY — AMPUTATION BELOW KNEE
Anesthesia: General | Site: Leg Lower | Laterality: Left | Wound class: Dirty or Infected

## 2013-08-09 MED ORDER — SENNA 8.6 MG PO TABS
1.0000 | ORAL_TABLET | Freq: Two times a day (BID) | ORAL | Status: DC
  Administered 2013-08-09 – 2013-08-14 (×9): 8.6 mg via ORAL
  Filled 2013-08-09 (×12): qty 1

## 2013-08-09 MED ORDER — SODIUM CHLORIDE 0.9 % IV SOLN
INTRAVENOUS | Status: DC | PRN
  Administered 2013-08-09: 09:00:00 via INTRAVENOUS

## 2013-08-09 MED ORDER — DIPHENHYDRAMINE HCL 12.5 MG/5ML PO ELIX: 12.5000 mg | ORAL_SOLUTION | ORAL | Status: DC | PRN

## 2013-08-09 MED ORDER — PROPOFOL 10 MG/ML IV BOLUS
INTRAVENOUS | Status: DC | PRN
  Administered 2013-08-09: 150 mg via INTRAVENOUS
  Administered 2013-08-09: 50 mg via INTRAVENOUS

## 2013-08-09 MED ORDER — 0.9 % SODIUM CHLORIDE (POUR BTL) OPTIME
TOPICAL | Status: DC | PRN
  Administered 2013-08-09: 1000 mL

## 2013-08-09 MED ORDER — METHOCARBAMOL 500 MG PO TABS
500.0000 mg | ORAL_TABLET | Freq: Four times a day (QID) | ORAL | Status: DC | PRN
  Administered 2013-08-10: 500 mg via ORAL
  Filled 2013-08-09: qty 1

## 2013-08-09 MED ORDER — HYDROMORPHONE HCL PF 1 MG/ML IJ SOLN
0.2500 mg | INTRAMUSCULAR | Status: DC | PRN
  Administered 2013-08-09 (×4): 0.5 mg via INTRAVENOUS

## 2013-08-09 MED ORDER — MAGNESIUM CITRATE PO SOLN: 1.0000 | Freq: Once | ORAL | Status: DC | PRN

## 2013-08-09 MED ORDER — CEFAZOLIN SODIUM-DEXTROSE 2-3 GM-% IV SOLR
2.0000 g | Freq: Four times a day (QID) | INTRAVENOUS | Status: AC
  Administered 2013-08-09 (×3): 2 g via INTRAVENOUS
  Filled 2013-08-09 (×3): qty 50

## 2013-08-09 MED ORDER — ONDANSETRON HCL 4 MG PO TABS: 4.0000 mg | ORAL_TABLET | Freq: Four times a day (QID) | ORAL | Status: DC | PRN

## 2013-08-09 MED ORDER — FENTANYL CITRATE 0.05 MG/ML IJ SOLN
INTRAMUSCULAR | Status: DC | PRN
  Administered 2013-08-09: 50 ug via INTRAVENOUS
  Administered 2013-08-09: 25 ug via INTRAVENOUS
  Administered 2013-08-09: 50 ug via INTRAVENOUS
  Administered 2013-08-09 (×2): 25 ug via INTRAVENOUS
  Administered 2013-08-09: 50 ug via INTRAVENOUS
  Administered 2013-08-09: 25 ug via INTRAVENOUS
  Administered 2013-08-09: 50 ug via INTRAVENOUS

## 2013-08-09 MED ORDER — SORBITOL 70 % SOLN
30.0000 mL | Freq: Every day | Status: DC | PRN
  Filled 2013-08-09: qty 30

## 2013-08-09 MED ORDER — KETAMINE HCL 10 MG/ML IJ SOLN
INTRAMUSCULAR | Status: DC | PRN
  Administered 2013-08-09: 10 mg via INTRAVENOUS

## 2013-08-09 MED ORDER — SODIUM CHLORIDE 0.9 % IV SOLN
INTRAVENOUS | Status: DC
  Administered 2013-08-10: 14:00:00 via INTRAVENOUS

## 2013-08-09 MED ORDER — INSULIN ASPART 100 UNIT/ML ~~LOC~~ SOLN
0.0000 [IU] | SUBCUTANEOUS | Status: DC
  Administered 2013-08-09: 8 [IU] via SUBCUTANEOUS
  Administered 2013-08-09: 5 [IU] via SUBCUTANEOUS
  Administered 2013-08-09: 3 [IU] via SUBCUTANEOUS

## 2013-08-09 MED ORDER — LACTATED RINGERS IV SOLN
INTRAVENOUS | Status: DC | PRN
  Administered 2013-08-09: 11:00:00 via INTRAVENOUS

## 2013-08-09 MED ORDER — PROMETHAZINE HCL 25 MG/ML IJ SOLN: 6.2500 mg | INTRAMUSCULAR | Status: DC | PRN

## 2013-08-09 MED ORDER — EPHEDRINE SULFATE 50 MG/ML IJ SOLN
INTRAMUSCULAR | Status: DC | PRN
  Administered 2013-08-09 (×2): 5 mg via INTRAVENOUS

## 2013-08-09 MED ORDER — SODIUM CHLORIDE 0.9 % IR SOLN
Status: DC | PRN
  Administered 2013-08-09 (×2): 3000 mL

## 2013-08-09 MED ORDER — INSULIN ASPART 100 UNIT/ML ~~LOC~~ SOLN
0.0000 [IU] | Freq: Three times a day (TID) | SUBCUTANEOUS | Status: DC
  Administered 2013-08-10: 3 [IU] via SUBCUTANEOUS
  Administered 2013-08-10: 5 [IU] via SUBCUTANEOUS
  Administered 2013-08-10: 3 [IU] via SUBCUTANEOUS
  Administered 2013-08-11: 1 [IU] via SUBCUTANEOUS
  Administered 2013-08-12: 5 [IU] via SUBCUTANEOUS
  Administered 2013-08-12 (×2): 3 [IU] via SUBCUTANEOUS
  Administered 2013-08-13 – 2013-08-14 (×5): 2 [IU] via SUBCUTANEOUS

## 2013-08-09 MED ORDER — METHOCARBAMOL 100 MG/ML IJ SOLN
500.0000 mg | Freq: Four times a day (QID) | INTRAVENOUS | Status: DC | PRN
  Administered 2013-08-09 (×2): 500 mg via INTRAVENOUS
  Filled 2013-08-09 (×3): qty 5

## 2013-08-09 MED ORDER — METOCLOPRAMIDE HCL 10 MG PO TABS: 5.0000 mg | ORAL_TABLET | Freq: Three times a day (TID) | ORAL | Status: DC | PRN

## 2013-08-09 MED ORDER — DIPHENHYDRAMINE HCL 12.5 MG/5ML PO ELIX: 25.0000 mg | ORAL_SOLUTION | ORAL | Status: DC | PRN

## 2013-08-09 MED ORDER — MIDAZOLAM HCL 5 MG/5ML IJ SOLN
INTRAMUSCULAR | Status: DC | PRN
  Administered 2013-08-09 (×2): 0.5 mg via INTRAVENOUS

## 2013-08-09 MED ORDER — LACTATED RINGERS IV SOLN: INTRAVENOUS | Status: DC

## 2013-08-09 MED ORDER — ACETAMINOPHEN 10 MG/ML IV SOLN
1000.0000 mg | Freq: Once | INTRAVENOUS | Status: DC
  Filled 2013-08-09 (×2): qty 100

## 2013-08-09 MED ORDER — PREGABALIN 75 MG PO CAPS
75.0000 mg | ORAL_CAPSULE | Freq: Two times a day (BID) | ORAL | Status: DC
  Administered 2013-08-09 – 2013-08-10 (×2): 75 mg via ORAL
  Filled 2013-08-09 (×2): qty 1

## 2013-08-09 MED ORDER — LIDOCAINE HCL (CARDIAC) 20 MG/ML IV SOLN
INTRAVENOUS | Status: DC | PRN
  Administered 2013-08-09: 50 mg via INTRAVENOUS

## 2013-08-09 MED ORDER — PREGABALIN 50 MG PO CAPS: 75.0000 mg | ORAL_CAPSULE | Freq: Two times a day (BID) | ORAL | Status: DC

## 2013-08-09 MED ORDER — POLYETHYLENE GLYCOL 3350 17 G PO PACK
17.0000 g | PACK | Freq: Every day | ORAL | Status: DC | PRN
  Filled 2013-08-09: qty 1

## 2013-08-09 MED ORDER — PHENYLEPHRINE HCL 10 MG/ML IJ SOLN
INTRAMUSCULAR | Status: DC | PRN
  Administered 2013-08-09 (×5): 40 ug via INTRAVENOUS

## 2013-08-09 MED ORDER — HYDROMORPHONE HCL PF 1 MG/ML IJ SOLN
INTRAMUSCULAR | Status: AC
  Administered 2013-08-09: 1 mg via INTRAVENOUS
  Filled 2013-08-09: qty 2

## 2013-08-09 MED ORDER — ACETAMINOPHEN 10 MG/ML IV SOLN
INTRAVENOUS | Status: DC | PRN
  Administered 2013-08-09: 1000 mg via INTRAVENOUS

## 2013-08-09 MED ORDER — ONDANSETRON HCL 4 MG/2ML IJ SOLN: 4.0000 mg | Freq: Four times a day (QID) | INTRAMUSCULAR | Status: DC | PRN

## 2013-08-09 MED ORDER — METOCLOPRAMIDE HCL 5 MG/ML IJ SOLN: 5.0000 mg | Freq: Three times a day (TID) | INTRAMUSCULAR | Status: DC | PRN

## 2013-08-09 MED ORDER — MORPHINE SULFATE 2 MG/ML IJ SOLN
1.0000 mg | INTRAMUSCULAR | Status: DC | PRN
  Administered 2013-08-09 (×2): 1 mg via INTRAVENOUS
  Filled 2013-08-09 (×2): qty 1

## 2013-08-09 MED ORDER — HYDROCODONE-ACETAMINOPHEN 5-325 MG PO TABS
1.0000 | ORAL_TABLET | ORAL | Status: DC | PRN
  Administered 2013-08-09 – 2013-08-10 (×4): 2 via ORAL
  Filled 2013-08-09 (×4): qty 2

## 2013-08-09 MED ORDER — OXYCODONE HCL 5 MG PO TABS
5.0000 mg | ORAL_TABLET | ORAL | Status: DC | PRN
  Administered 2013-08-10: 10 mg via ORAL
  Administered 2013-08-10: 5 mg via ORAL
  Filled 2013-08-09: qty 1
  Filled 2013-08-09: qty 2

## 2013-08-09 SURGICAL SUPPLY — 43 items
BANDAGE ELASTIC 4 VELCRO ST LF (GAUZE/BANDAGES/DRESSINGS) ×2 IMPLANT
BANDAGE ELASTIC 6 VELCRO ST LF (GAUZE/BANDAGES/DRESSINGS) ×4 IMPLANT
BLADE SAG 18X100X1.27 (BLADE) ×2 IMPLANT
BLADE SAGITTAL 25.0X1.27X90 (BLADE) ×2 IMPLANT
BNDG COHESIVE 6X5 TAN STRL LF (GAUZE/BANDAGES/DRESSINGS) ×2 IMPLANT
CUFF TOURN SGL QUICK 34 (TOURNIQUET CUFF) ×3
CUFF TRNQT CYL 34X4X40X1 (TOURNIQUET CUFF) ×1 IMPLANT
DRAPE EXTREMITY T 121X128X90 (DRAPE) ×2 IMPLANT
DRAPE U-SHAPE 47X51 STRL (DRAPES) ×4 IMPLANT
ELECT REM PT RETURN 9FT ADLT (ELECTROSURGICAL) ×3
ELECTRODE REM PT RTRN 9FT ADLT (ELECTROSURGICAL) ×1 IMPLANT
EVACUATOR 1/8 PVC DRAIN (DRAIN) ×2 IMPLANT
GAUZE XEROFORM 5X9 LF (GAUZE/BANDAGES/DRESSINGS) ×2 IMPLANT
GLOVE BIOGEL PI IND STRL 7.0 (GLOVE) ×1 IMPLANT
GLOVE BIOGEL PI IND STRL 7.5 (GLOVE) ×1 IMPLANT
GLOVE BIOGEL PI INDICATOR 7.0 (GLOVE) ×1
GLOVE BIOGEL PI INDICATOR 7.5 (GLOVE) ×1
GLOVE SURG SS PI 6.5 STRL IVOR (GLOVE) ×4 IMPLANT
GLOVE SURG SS PI 7.5 STRL IVOR (GLOVE) ×6 IMPLANT
GOWN STRL REIN XL XLG (GOWN DISPOSABLE) ×4 IMPLANT
KIT BASIN OR (CUSTOM PROCEDURE TRAY) ×2 IMPLANT
NS IRRIG 1000ML POUR BTL (IV SOLUTION) ×2 IMPLANT
PACK LOWER EXTREMITY WL (CUSTOM PROCEDURE TRAY) ×2 IMPLANT
PADDING CAST COTTON 6X4 STRL (CAST SUPPLIES) ×2 IMPLANT
POSITIONER SURGICAL ARM (MISCELLANEOUS) ×2 IMPLANT
SOL PREP POV-IOD 16OZ 10% (MISCELLANEOUS) ×2 IMPLANT
SPLINT PLASTER CAST XFAST 5X30 (CAST SUPPLIES) ×2 IMPLANT
SPLINT PLASTER XFAST SET 5X30 (CAST SUPPLIES) ×2
SPONGE GAUZE 4X4 12PLY (GAUZE/BANDAGES/DRESSINGS) ×2 IMPLANT
SPONGE LAP 18X18 X RAY DECT (DISPOSABLE) ×4 IMPLANT
STOCKINETTE 8 INCH (MISCELLANEOUS) ×4 IMPLANT
SUT ETHILON 2 0 PS N (SUTURE) ×8 IMPLANT
SUT SILK 0 (SUTURE) ×3
SUT SILK 0 30XBRD TIE 6 (SUTURE) ×1 IMPLANT
SUT SILK 2 0 (SUTURE) ×3
SUT SILK 2-0 18XBRD TIE 12 (SUTURE) ×1 IMPLANT
SUT VIC AB 0 CT1 27 (SUTURE) ×6
SUT VIC AB 0 CT1 27XBRD ANTBC (SUTURE) ×2 IMPLANT
SUT VIC AB 2-0 CT1 27 (SUTURE) ×6
SUT VIC AB 2-0 CT1 TAPERPNT 27 (SUTURE) ×2 IMPLANT
TOWEL NATURAL 10PK STERILE (DISPOSABLE) ×2 IMPLANT
TOWEL OR NON WOVEN STRL DISP B (DISPOSABLE) ×2 IMPLANT
WATER STERILE IRR 1500ML POUR (IV SOLUTION) ×2 IMPLANT

## 2013-08-09 NOTE — Anesthesia Postprocedure Evaluation (Signed)
Anesthesia Post Note  Patient: [name]  Procedure(s) Performed: Procedure(s) (LRB): AMPUTATION BELOW KNEE (Left)  Anesthesia type: General  Patient location: PACU  Post pain: Pain level controlled  Post assessment: Post-op Vital signs reviewed  Last Vitals:  Filed Vitals:   08/09/13 1145  BP: 118/55  Pulse: 63  Temp:   Resp: 10    Post vital signs: Reviewed  Level of consciousness: sedated  Complications: No apparent anesthesia complications

## 2013-08-09 NOTE — Progress Notes (Signed)
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, STEMI 03/2009, CAD DM ty 2 with PAD, Prior CVA 4/14, Morbid obesity, Body mass index is 39.88 kg/(m^2)., CKD 4-5 admitted 08/06/13 with impending gangrene  L foot, with WBC 12 and drainage Underwent L lis-franc amputation 9/19 am-she continues to have low-grade temperatures despite being on antibiotics She ultimately underwent BKA on 08/09/13  Past medical history-As per Problem list Chart reviewed as below-  Consultants:  Ortho-Dr.XU  Procedures:  See above  Transfused 2 U PRBC 9/20  Tranfused 1 U PRBc 9/21  Antibiotics:  Zosyn 9/18-9/21  Vancomycin 9/18-9/21   Subjective  Sleepy Upset about her leg. Wants to eat. Family bedside    Objective    Interim History:   Telemetry: nad  Objective: Filed Vitals:   08/09/13 1145 08/09/13 1202 08/09/13 1308 08/09/13 1434  BP: 118/55 109/63 144/70 112/64  Pulse: 63 56 69 68  Temp:  98.9 F (37.2 C) 98.7 F (37.1 C) 97.2 F (36.2 C)  TempSrc:   Oral Oral  Resp: 12 12 16 16   Height:      Weight:      SpO2: 99% 98% 100% 100%    Intake/Output Summary (Last 24 hours) at 08/09/13 1710 Last data filed at 08/09/13 1146  Gross per 24 hour  Intake 3663.5 ml  Output   1100 ml  Net 2563.5 ml    Exam:  General: well, EOMI Cardiovascular: s1 s2 no m/r/g Respiratory: clear Abdomen: soft, NT/ND Skin no LE edema Neuro Intact and grossly moving all 4 extremities  Data Reviewed: Basic Metabolic Panel:  Recent Labs Lab 08/06/13 1115 08/07/13 0430 08/08/13 0558 08/08/13 1405 08/09/13 0132  NA 134* 135  --  131* 129*  K 3.8 4.3  --  4.2 4.1  CL 96 100  --  97 96  CO2 23 21  --  22 22  GLUCOSE 171* 262*  --  282* 183*  BUN 84* 80*  --  74* 74*  CREATININE 3.14* 3.14* 3.53* 3.52* 3.74*  CALCIUM 9.8 9.1  --  9.0 8.9  PHOS  --   --   --  5.4* 4.9*   Liver  Function Tests:  Recent Labs Lab 08/06/13 1115 08/08/13 1405 08/09/13 0132  AST 10  --   --   ALT 7  --   --   ALKPHOS 125*  --   --   BILITOT 0.4  --   --   PROT 8.1  --   --   ALBUMIN 2.7* 2.1* 2.1*   No results found for this basename: LIPASE, AMYLASE,  in the last 168 hours No results found for this basename: AMMONIA,  in the last 168 hours CBC:  Recent Labs Lab 08/06/13 1115 08/07/13 0430 08/08/13 1405 08/09/13 0132  WBC 12.2* 10.1 9.4 10.2  NEUTROABS 8.8*  --  6.4 6.5  HGB 7.9* 6.9* 6.1* 6.8*  HCT 24.4* 21.2* 19.5* 21.0*  MCV 82.2 82.8 84.4 84.0  PLT 210 170 164 199   Cardiac Enzymes: No results found for this basename: CKTOTAL, CKMB, CKMBINDEX, TROPONINI,  in the last 168 hours BNP: No components found with this basename: POCBNP,  CBG:  Recent Labs Lab 08/08/13 1703 08/08/13 2212 08/09/13 0408 08/09/13 0715 08/09/13 1129  GLUCAP 295* 242* 174* 188* 240*    Recent Results (from the past 240 hour(s))  CULTURE, BLOOD (ROUTINE X 2)  Status: None   Collection Time    08/06/13 11:15 AM      Result Value Range Status   Specimen Description BLOOD LEFT ANTECUBITAL   Final   Special Requests BOTTLES DRAWN AEROBIC AND ANAEROBIC 5CC   Final   Culture  Setup Time     Final   Value: 08/06/2013 14:49     Performed at [facility]   Culture     Final   Value:        BLOOD CULTURE RECEIVED NO GROWTH TO DATE CULTURE WILL BE HELD FOR 5 DAYS BEFORE ISSUING A FINAL NEGATIVE REPORT     Performed at [facility]   Report Status PENDING   Incomplete  CULTURE, BLOOD (ROUTINE X 2)     Status: None   Collection Time    08/06/13 11:40 AM      Result Value Range Status   Specimen Description BLOOD RIGHT ANTECUBITAL   Final   Special Requests BOTTLES DRAWN AEROBIC AND ANAEROBIC 6ML   Final   Culture  Setup Time     Final   Value: 08/06/2013 14:49     Performed at [facility]   Culture     Final   Value:        BLOOD CULTURE RECEIVED NO  GROWTH TO DATE CULTURE WILL BE HELD FOR 5 DAYS BEFORE ISSUING A FINAL NEGATIVE REPORT     Performed at [facility]   Report Status PENDING   Incomplete  MRSA PCR SCREENING     Status: None   Collection Time    08/07/13  6:30 AM      Result Value Range Status   MRSA by PCR NEGATIVE  NEGATIVE Final   Comment:            The GeneXpert MRSA Assay (FDA     approved for NASAL specimens     only), is one component of a     comprehensive MRSA colonization     surveillance program. It is not     intended to diagnose MRSA     infection nor to guide or     monitor treatment for     MRSA infections.     Studies:              All Imaging reviewed and is as per above notation   Scheduled Meds: . acetaminophen  1,000 mg Intravenous Once  . aspirin  325 mg Oral Q24H  . atorvastatin  40 mg Oral q1800  . carvedilol  25 mg Oral BID WC  .  ceFAZolin (ANCEF) IV  2 g Intravenous Q6H  . enoxaparin (LOVENOX) injection  30 mg Subcutaneous Q24H  . ferrous sulfate  325 mg Oral Q breakfast  . furosemide  40 mg Oral Daily  . insulin aspart  0-15 Units Subcutaneous Q4H  . insulin glargine  10 Units Subcutaneous QHS  . niacin  500 mg Oral QHS  . omega-3 acid ethyl esters  1 g Oral TID  . polyethylene glycol  17 g Oral Daily  . senna  1 tablet Oral BID   Continuous Infusions: . sodium chloride Stopped (08/09/13 1030)  . sodium chloride       Assessment/Plan: 1. S/p Transmet Amputation 9/19/L BKA 9/21-Ortho to address  Pain management, Empiric antibiotics coverage, weight bearing.  Therapy services requested-she will need SNF   1. Anemia-multifactorial-2/2 to CKD + ABLA-rpt CBc in am.  Transfused x 3 this admit.  Continue FeSo4 325 daily 2. DM ty 2-Blood sugars 275-290. Lantus 10 units.  SSI sensitive 3. H/o CAD/PAD-Asa 325 daily, Cont Coreg 25 bid-Sees [facility] for this.  No further cardiac issues presently.  OP follow up Dr. Ellyn Hack 4. Hld-Cont Lipitor 40 daily, Niacin 500 mg daily,  Lovaza 1 g tid 5. CHF-Euvolemic.  Lasix held overngiht 9/21.  Monitor Renal panel in am 6. CKD stage 4-5-Still makes urine.  Cut back lasix to 40 once daily-Follow Dr. Moshe Cipro on d/c 7. HLD-Continue Niacin CR 500 qhs, Lipitor 40 daily 8. Constipation-discontinued Magnesium citrate given CKD, cont Miralax 17 g daily, Senna 1 tab bid, Lactulose 30 prn 9. Neuropathy/Phantom limb prohylaxis-continue Lyrica 75 daily   Code Status: Full  Family Communication: spoke with both daughters today-understand Disposition Plan: med-surg   Verneita Griffes, MD  [facility] Pager [phone] 08/09/2013, 5:10 PM    LOS: 3 days

## 2013-08-09 NOTE — Op Note (Signed)
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 CPT [phone].  Surgeon: N. Eduard Roux, MD  Anesthesia: General  Drains: One medium Hemovac  Estimated blood loss: Q000111Q cc  Complications: None  Condition to PACU: Stable  Indications for procedure: [name] is a 62 year old female with chronic kidney disease and diabetes who underwent left transmetatarsal amputation on Friday, 08/07/2013 returns today for a repeat irrigation and debridement with possible transtibial amputation.  Prior to today he family was made aware of the possibility of a transtibial amputation today if the patient's left foot did not appear to be viable. Once the original wound was opened up in the operating room the appearance of continued infection and non-viability was evident. The tissue did not display viability.  Description of procedure: The patient was brought down to the holding area. She was identified. She was brought back to the operating room. She was placed in the supine position on the operating room table. General anesthesia was induced by the anesthesiologist. A nonsterile well-padded tourniquet was placed on the upper thigh. The left lower extremity was prepped and draped in standard sterile fashion. The tourniquet was inflated to 300 mmHg. The foot incision was opened up and evaluated for continued infection and viability. The decision was made to perform the transtibial amputation once the tissue exhibited continued infection and non-viability. The plantar foot tissue was grossly infected and did not have adequate blood supply. The tissues were sharply debrided with a knife and did not bleed appropriately. The wound beds still had a foul odor to it.  At that time we decided to proceed with the transtibial amputation. A fishmouth incision with a long posterior flap was  used. Blunt dissection was taken down through the muscle and fascia. The neurovascular structures were identified and tied off with silk sutures.  The nerves were sharply incised and allowed to retract into the leg.  The tibia was osteotomized approximately 15 cm distal to the knee joint. The fibula was also osteotomized 2 cm proximal to the level of the tibial osteotomy. The bones were beveled in a fashion that left no sharp edges. Once this was done the muscles were debulked. The tourniquet was deflated. Hemostasis was achieved. 6 L of normal saline was irrigated through the wound. All tissues appeared to be healthy and viable. A medium Hemovac was placed deep to the muscle layer. Myodesis of the posterior muscular compartment was performed to the distal end of the tibia and fibula. The wound was closed in a layered fashion with the use of 0 Vicryl, 2-0 Vicryl, and 2-0 nylon sutures. A sterile dressing was applied and a stump splint was placed with the knee in full extension.  Disposition: [name] will return back to her room. She will be nonweightbearing to the left lower extremity. In 2 days she will be changed to a stump cast. She will need to undergo evaluation by physical and occupational therapy. She'll need to followup with me in 3 weeks.  I will have a prosthetist comes speak with her about prosthetic fitting.  [name] Cecil, MD [employer] [phone]

## 2013-08-09 NOTE — Progress Notes (Signed)
pacu nursing - Pt restless and pulled on tubes and drains.  hemovac drain pulled out by patient.  md aware.  Info acknowledged

## 2013-08-09 NOTE — Progress Notes (Signed)
Paged NP, R. Riedler with [facility] due to patient CBG of 239.  Patient with no hs coverage on Lantus.  Instructed to give patient hs Lantus only at this time.  No new orders IV fluids decreased to 75

## 2013-08-09 NOTE — Anesthesia Preprocedure Evaluation (Addendum)
Anesthesia Evaluation  Patient identified by MRN, date of birth, ID band Patient awake    Reviewed: Allergy & Precautions, H&P , NPO status , Patient's Chart, lab work & pertinent test results  Airway Mallampati: II TM Distance: >3 FB Neck ROM: Full    Dental no notable dental hx. (+) Edentulous Upper and Edentulous Lower   Pulmonary neg pulmonary ROS,  breath sounds clear to auscultation  Pulmonary exam normal       Cardiovascular hypertension, Pt. on medications and Pt. on home beta blockers + CAD, + Past MI, + Cardiac Stents and + Peripheral Vascular Disease Rhythm:Regular Rate:Normal  Cardiology note from 06-22-13 reviewed.   Neuro/Psych  Headaches, Neurology note from 06-22-13 reviewed. Of note, Hgb was noted to be 7.5 on 06-08-13  Stroke April 2014  Neuromuscular disease CVA, Residual Symptoms negative psych ROS   GI/Hepatic negative GI ROS, Neg liver ROS,   Endo/Other  diabetes, Type 1, Insulin DependentMorbid obesity  Renal/GU Renal Insufficiency and CRFRenal diseaseH/O acute on chronic renal failure requiring hemodialysis in the past.  negative genitourinary   Musculoskeletal negative musculoskeletal ROS (+)   Abdominal (+) + obese,   Peds negative pediatric ROS (+)  Hematology negative hematology ROS (+)   Anesthesia Other Findings   Reproductive/Obstetrics negative OB ROS                          Anesthesia Physical Anesthesia Plan  ASA: III  Anesthesia Plan: General   Post-op Pain Management:    Induction: Intravenous  Airway Management Planned: Oral ETT  Additional Equipment:   Intra-op Plan:   Post-operative Plan: Extubation in OR  Informed Consent: I have reviewed the patients History and Physical, chart, labs and discussed the procedure including the risks, benefits and alternatives for the proposed anesthesia with the patient or authorized representative who has  indicated his/her understanding and acceptance.   Dental advisory given  Plan Discussed with: CRNA  Anesthesia Plan Comments:         Anesthesia Quick Evaluation

## 2013-08-09 NOTE — Progress Notes (Signed)
   CARE MANAGEMENT NOTE 08/09/2013  Patient:  [name],[name]   Account Number:  [id number]  Date Initiated:  08/07/2013  Documentation initiated by:  [facility]  Subjective/Objective Assessment:   62 year old female admitted with left 2nd toe gangrene.     Action/Plan:   From home, active with [facility].   Anticipated DC Date:  08/10/2013   Anticipated DC Plan:  [place]  CM consult      Choice offered to / List presented to:             Status of service:  In process, will continue to follow Medicare Important Message given?  NA - LOS <3 / Initial given by admissions (If response is "NO", the following Medicare IM given date fields will be blank) Date Medicare IM given:   Date Additional Medicare IM given:    Discharge Disposition:    Per UR Regulation:  Reviewed for med. necessity/level of care/duration of stay  If discussed at [place] of Stay Meetings, dates discussed:    Comments:  08/09/2013  1630 NCM spoke to dtr, [name] # [phone]. States she will be with her mother around the clock and they have two other adults available to assist with her care. They want to plan dc home with resumption of care with [facility]. Dtr states she will wait until the final recommendation HH vs CIR/SNF-rehab. States she will need wheelchair for home. She has RW and bedside commode. Jonnie Finner RN CCM Case Mgmt phone [phone]  08/07/13 Allene Dillon RN BSN [phone] Pt was active with [place] for [facility] services, if services continue to be need, MD will need to order resumption of services.

## 2013-08-09 NOTE — Progress Notes (Signed)
Walden Field NP [facility] paged regarding patient post transfusion and am labs.  Discussed pt current renal status as well as Hgb.  No new orders at this time.  Patient will be changed to a every 4 hour CBG schedule

## 2013-08-09 NOTE — Transfer of Care (Signed)
Immediate Anesthesia Transfer of Care Note  Patient: [name]  Procedure(s) Performed: Procedure(s): AMPUTATION BELOW KNEE (Left)  Patient Location: PACU  Anesthesia Type:General  Level of Consciousness: awake, sedated and confused  Airway & Oxygen Therapy: Patient Spontanous Breathing and Patient connected to face mask oxygen  Post-op Assessment: Report given to PACU RN and Post -op Vital signs reviewed and stable  Post vital signs: stable  Complications: No apparent anesthesia complications

## 2013-08-09 NOTE — Brief Op Note (Signed)
Brief Op Note  Date of Surgery: 08/09/2013  Preoperative Diagnosis: Left foot infection  Postoperative Diagnosis: same  Procedure: Procedure(s): AMPUTATION BELOW KNEE  Implants: none  Surgeons: Surgeon(s): Finneas Mathe Eduard Roux, MD  Anesthesia: General  Drains: 1 medium hemovac  Estimated Blood Loss: Q000111Q cc  Complications: None  Condition to PACU: Stable  Gianni Mihalik Eduard Roux, MD [place] 08/09/2013 10:37 AM

## 2013-08-09 NOTE — Progress Notes (Signed)
Discussed pt with Randal Reidler NP with [facility].  Patient with no output since OR in and out cath.  Patient scanned only 11 cc showing .  IV currently at 125 cc.  Orders to place foley. 75cc out.  Await additional orders regarding patient

## 2013-08-10 ENCOUNTER — Encounter (HOSPITAL_COMMUNITY): Payer: Self-pay | Admitting: Orthopaedic Surgery

## 2013-08-10 ENCOUNTER — Inpatient Hospital Stay (HOSPITAL_COMMUNITY): Payer: Medicare Other

## 2013-08-10 LAB — RENAL FUNCTION PANEL
Albumin: 1.9 g/dL — ABNORMAL LOW (ref 3.5–5.2)
CO2: 18 mEq/L — ABNORMAL LOW (ref 19–32)
Calcium: 8.6 mg/dL (ref 8.4–10.5)
Calcium: 8.5 mg/dL (ref 8.4–10.5)
Creatinine, Ser: 5.23 mg/dL — ABNORMAL HIGH (ref 0.50–1.10)
Creatinine, Ser: 5.6 mg/dL — ABNORMAL HIGH (ref 0.50–1.10)
GFR calc Af Amer: 9 mL/min — ABNORMAL LOW (ref >90)
GFR calc non Af Amer: 8 mL/min — ABNORMAL LOW (ref >90)
GFR calc non Af Amer: 7 mL/min — ABNORMAL LOW (ref >90)
Glucose, Bld: 239 mg/dL — ABNORMAL HIGH (ref 70–99)
Phosphorus: 7.7 mg/dL — ABNORMAL HIGH (ref 2.3–4.6)
Sodium: 129 mEq/L — ABNORMAL LOW (ref 135–145)
Sodium: 128 mEq/L — ABNORMAL LOW (ref 135–145)

## 2013-08-10 LAB — GLUCOSE, CAPILLARY
Glucose-Capillary: 225 mg/dL — ABNORMAL HIGH (ref 70–99)
Glucose-Capillary: 218 mg/dL — ABNORMAL HIGH (ref 70–99)

## 2013-08-10 LAB — CREATININE, URINE, RANDOM: Creatinine, Urine: 107.3 mg/dL

## 2013-08-10 LAB — SODIUM, URINE, RANDOM: Sodium, Ur: 30 mEq/L

## 2013-08-10 MED ORDER — CARVEDILOL 6.25 MG PO TABS
6.2500 mg | ORAL_TABLET | Freq: Two times a day (BID) | ORAL | Status: DC
  Administered 2013-08-11: 6.25 mg via ORAL
  Filled 2013-08-10 (×4): qty 1

## 2013-08-10 MED ORDER — FUROSEMIDE 10 MG/ML IJ SOLN
40.0000 mg | Freq: Once | INTRAMUSCULAR | Status: AC
  Administered 2013-08-10: 40 mg via INTRAVENOUS
  Filled 2013-08-10: qty 4

## 2013-08-10 MED ORDER — SODIUM CHLORIDE 0.9 % IV BOLUS (SEPSIS)
500.0000 mL | Freq: Once | INTRAVENOUS | Status: AC
  Administered 2013-08-10: 500 mL via INTRAVENOUS

## 2013-08-10 MED ORDER — PREGABALIN 50 MG PO CAPS: 50.0000 mg | ORAL_CAPSULE | Freq: Every day | ORAL | Status: DC

## 2013-08-10 MED ORDER — FUROSEMIDE 40 MG PO TABS
40.0000 mg | ORAL_TABLET | Freq: Every day | ORAL | Status: DC
  Filled 2013-08-10: qty 1

## 2013-08-10 NOTE — Progress Notes (Addendum)
Koleen Nimrod NP [facility] notified that patient has had 50 cc of urine from foley since insertion @2048  08-09-13, discussed patient's previous lasix doses.  Orders received.  Will continue to monitor output   0503 R Reidler text paged that patient has had only 40 cc of urine since IV Lasix. Foley placement checked and repositioned for urine flow.  Patient states no feelings of having to urinate.   Bladder Scanned patient to check for residuals.  No urine per bladder scan.  Await additional orders.

## 2013-08-10 NOTE — Progress Notes (Signed)
Clinical Social Work  CSW met with patient and dtr [name]) at bedside. CSW introduced myself and explained role. CSW provided bed offers for [facility]. After reviewing list, dtr reports that family has toured [facility] and feel it would be the best placement for patient. Patient also agreeable to this plan. CSW called [facility] and admission coordinator is agreeable to accept patient when medically stable.  [place], [place] [phone]

## 2013-08-10 NOTE — Consult Note (Signed)
Renal Service Consult Note [facility]  Ivona Cooksey 08/10/2013 [place] D Requesting Physician:  Dr Verlon Au  Reason for Consult:  Acute on CRF after BKA HPI: The patient is a 62 y.o. year-old with hx of DM, CAD, HTN, HL, CVA April '14, stage IV CKD, and PAD who was admitted on 9/18 with infected L foot / gangrene.  Was not a candidate for revascularization and underwent L BKA on 9/21 yesterday. Baseline creat is 3.0 with stage IV CKD f/b Dr Moshe Cipro at [facility].  Creatinine now rising last 72 hrs with poor UOP, 365 cc out yesterday, and creat up to 5.6 today.   Currently pt is difficult to arouse, very somnolent. She has been getting po and IV narcotics- vicodin, IV mso4, IV dilaudid and oxycodone- and some po robaxin.  She has gotten 2 doses IV vanc and 11 doses of IV zosyn since admission. No iv contrast, acei or arb.  BP's have been good.      ROS  not available  Past Medical History  Past Medical History  Diagnosis Date  . ST elevation myocardial infarction (STEMI) of inferior wall  03/2009    Ostial/proximal RCA occlusion treated with BMS stent  . CAD (coronary artery disease), native coronary artery      status post PCI to the RCA for inferior STEMI  . Cancer     endo ca  . Hypertension associated with diabetes   . Hyperlipidemia LDL goal <70   . Diabetes mellitus, type II, insulin dependent     With complications - CAD, CVA, peripheral ulcer ,  . Stroke/cerebrovascular accident  4/30/ 2014    Right-sided weakness, mostly with balance issues and only mild weakness.  . Diabetic peripheral neuropathy associated with type 2 diabetes mellitus   . Obesity, Class III, BMI 40-49.9 (morbid obesity)     BMI 43; 5' 2',  235 pounds 6.4 ounces  . CKD (chronic kidney disease), stage IV     Recent acute on chronic exacerbation in July 2014  . PAD (peripheral artery disease)   . Diabetic foot ulcer   . Dry gangrene     Left second toe; now on Sole of L foot    Past Surgical History  Past Surgical History  Procedure Laterality Date  . Abdominal hysterectomy    . Leg tendon surgery      patient fell in a store right leg  . Leg surgery      hole in bone( during Childhood)  . Cardiac catheterization  03/31/2009    Proximal RCA thrombotic occlusion (inferior STEMI) other coronaries but in a codominant system  . Coronary angioplasty  03/31/2009    PTCA , bare-metal stent 3.5 mm x 24 mm ostium of RCA ,EF 55%  . Amputation Left 08/07/2013    Procedure: left midfoot amputation;  Surgeon: Marianna Payment, MD;  Location: [facility];  Service: Orthopedics;  Laterality: Left;  . Amputation Left 08/09/2013    Procedure: AMPUTATION BELOW KNEE;  Surgeon: Marianna Payment, MD;  Location: [facility]S;  Service: Orthopedics;  Laterality: Left;   Family History  Family History  Problem Relation Age of Onset  . Alcoholism Mother     Died from complications  . Alcoholism Father     Died from complications  . Heart disease      Unknown, unclear. Patient is not a good historian.  Essentially no pertinent history known   Social History  reports that she quit smoking about 18 years  ago. She has never used smokeless tobacco. She reports that she does not drink alcohol or use illicit drugs. Allergies No Known Allergies Home medications Prior to Admission medications   Medication Sig Start Date End Date Taking? Authorizing Provider  aspirin 325 MG tablet Take 325 mg by mouth daily. Take 30 minutes after taking  500 cr niaspan   Yes Historical Provider, MD  atorvastatin (LIPITOR) 40 MG tablet Take 1 tablet (40 mg total) by mouth daily at 6 PM. 03/23/13  Yes Geradine Girt, DO  carvedilol (COREG) 25 MG tablet Take 25 mg by mouth 2 (two) times daily with a meal.   Yes Historical Provider, MD  ferrous sulfate 325 (65 FE) MG tablet Take 325 mg by mouth daily with breakfast.   Yes Historical Provider, MD  furosemide (LASIX) 40 MG tablet Take 1 tablet (40 mg total) by mouth 2  (two) times daily. 06/09/13  Yes Ripudeep Krystal Eaton, MD  insulin aspart (NOVOLOG) 100 UNIT/ML injection Inject 0-15 Units into the skin 3 (three) times daily with meals. Sliding scale insulin CBG 70 - 120: 0 units: CBG 121 - 150: 2 units; CBG 151 - 200: 3 units; CBG 201 - 250: 5 units; CBG 251 - 300: 8 units;CBG 301 - 350: 11 units; CBG 351 - 400: 15 units; CBG > 400 : 15 units and notify your doctor 06/09/13  Yes Ripudeep Krystal Eaton, MD  Insulin Glargine (LANTUS SOLOSTAR) 100 UNIT/ML SOPN Inject 30 Units into the skin at bedtime.   Yes Historical Provider, MD  niacin (NIASPAN) 500 MG CR tablet 500 mg. Take 1 tablet (500 mg total) by mouth at bedtime. Take with snack.  30 minutes prior to taking medication take aspirin 325 mg daily. 07/10/13 07/10/14 Yes Historical Provider, MD  omega-3 acid ethyl esters (LOVAZA) 1 G capsule Take 1 g by mouth 3 (three) times daily.    Yes Historical Provider, MD   Liver Function Tests  Recent Labs Lab 08/06/13 1115  08/09/13 0132 08/10/13 0540 08/10/13 1312  AST 10  --   --   --   --   ALT 7  --   --   --   --   ALKPHOS 125*  --   --   --   --   BILITOT 0.4  --   --   --   --   PROT 8.1  --   --   --   --   ALBUMIN 2.7*  < > 2.1* 1.9* 1.9*  < > = values in this interval not displayed. No results found for this basename: LIPASE, AMYLASE,  in the last 168 hours CBC  Recent Labs Lab 08/06/13 1115 08/07/13 0430 08/08/13 1405 08/09/13 0132  WBC 12.2* 10.1 9.4 10.2  NEUTROABS 8.8*  --  6.4 6.5  HGB 7.9* 6.9* 6.1* 6.8*  HCT 24.4* 21.2* 19.5* 21.0*  MCV 82.2 82.8 84.4 84.0  PLT 210 170 164 [id number]   Basic Metabolic Panel  Recent Labs Lab 08/06/13 1115 08/07/13 0430 08/08/13 0558 08/08/13 1405 08/09/13 0132 08/10/13 0540 08/10/13 1312  NA 134* 135  --  131* 129* 129* 128*  K 3.8 4.3  --  4.2 4.1 4.8 4.7  CL 96 100  --  97 96 96 94*  CO2 23 21  --  22 22 18* 18*  GLUCOSE 171* 262*  --  282* 183* 239* 273*  BUN 84* 80*  --  74* 74* 76* 77*  CREATININE 3.14*  3.14* 3.53* 3.52* 3.74* 5.23* 5.60*  CALCIUM 9.8 9.1  --  9.0 8.9 8.6 8.5  PHOS  --   --   --  5.4* 4.9* 7.4* 7.7*    Physical Exam  Blood pressure 114/71, pulse 62, temperature 97.6 F (36.4 C), temperature source Oral, resp. rate 18, height 5' 2.99" (1.6 m), weight 102.1 kg (225 lb 1.4 oz), SpO2 98.00%. Gen: adult AAF , lethargic, mumbles response, won't open eyes, not in distress Skin: no rash, cyanosis HEENT:  EOMI, sclera anicteric, throat not examined Neck: + JVD to angle of jaw Chest: bibasilar rales 1/3 up, no wheezing CV: regular, 2/6 SEM, no rub or gallop Abdomen: soft, obese, nontender, + BS, large pannus Ext: mild diffuse nonpitting edema UE's primarily, no joint effusion, L BKA,no gangrene/ulcers Neuro: somnolent, difficult to arouse, +asterixis trying to hold arms up for exam   Assessment: 1. Acute on CRF- likely AIN from cephalosporin, less likely vanc toxicity. BP ok, no acei/arb or contrast. Is off abx now. Not vol depleted, vol expanded at this time. She is encephalopathic tonight, suspect combination of meds and uremia.   2. CKD stage IV, baseline creat 3.0 3. S/P L BKA 9/21, POD #1 4. DM, longstanding 5. CVA april 2014 6. Hx MI/CAD 7. Morbid obesity 8. HTN- on coreg, will reduce to 6.25 bid for now 9. HL  Plan- stop all narcotics and sedating meds, transfer [facility].  If MS not improving off of pain meds she will need HD for uremia.  Vol excess now, will stop fluids.  D/W primary MD and with family and questions answered.   Kelly Splinter  MD Pager [phone]    Cell  [phone] 08/10/2013, 6:30 PM

## 2013-08-10 NOTE — Progress Notes (Signed)
Transferred to [facility] at 2100, daughter at bedside, report given to receiving Rn

## 2013-08-10 NOTE — Progress Notes (Signed)
Subjective:  Patient reports pain as moderate.  No events.  Objective:   VITALS:   Filed Vitals:   08/09/13 1434 08/09/13 2223 08/10/13 0231 08/10/13 0522  BP: 112/64 119/70 133/67 121/49  Pulse: 68 64 61 61  Temp: 97.2 F (36.2 C) 97.6 F (36.4 C) 97.6 F (36.4 C) 97.3 F (36.3 C)  TempSrc: Oral Oral Oral Oral  Resp: 16 18 18 18   Height:      Weight:      SpO2: 100% 100% 100% 100%    Neurovascular intact Incision: dressing C/D/I No cellulitis present Compartment soft   Lab Results  Component Value Date   WBC 10.2 08/09/2013   HGB 6.8* 08/09/2013   HCT 21.0* 08/09/2013   MCV 84.0 08/09/2013   PLT 199 08/09/2013     Assessment/Plan: 1 Day Post-Op   Problem List Items Addressed This Visit   Type II or unspecified type diabetes mellitus with renal manifestations, 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)   Relevant Medications      Insulin Glargine (LANTUS SOLOSTAR) 100 UNIT/ML SOPN      aspirin 325 MG tablet      aspirin tablet 325 mg      atorvastatin (LIPITOR) tablet 40 mg      insulin glargine (LANTUS) injection 10 Units      insulin aspart (novoLOG) injection 0-9 Units (Start on 08/10/2013  8:00 AM)   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   Relevant Medications      Insulin Glargine (LANTUS SOLOSTAR) 100 UNIT/ML SOPN      aspirin 325 MG tablet      aspirin tablet 325 mg      atorvastatin (LIPITOR) tablet 40 mg      insulin glargine (LANTUS) injection 10 Units      insulin aspart (novoLOG) injection 0-9 Units (Start on 08/10/2013  8:00 AM)   Foot infection   Relevant Medications      ceFAZolin (ANCEF) IVPB 2 g/50 mL premix (Completed)    Other Visit Diagnoses   Toe gangrene    -  Primary    PVD (peripheral vascular disease)        Relevant Medications       aspirin 325 MG tablet       carvedilol (COREG) 25 MG tablet       carvedilol (COREG) tablet 25 mg       aspirin tablet 325 mg       atorvastatin (LIPITOR) tablet 40 mg       omega-3 acid ethyl esters  (LOVAZA) capsule 1 g       furosemide (LASIX) injection 20 mg (Completed)       furosemide (LASIX) injection 40 mg (Completed)       furosemide (LASIX) tablet 40 mg (Start on 08/10/2013 10:00 AM)    DM type 2, uncontrolled, with lower extremity ulcer        Relevant Medications       Insulin Glargine (LANTUS SOLOSTAR) 100 UNIT/ML SOPN       aspirin 325 MG tablet       aspirin tablet 325 mg       atorvastatin (LIPITOR) tablet 40 mg       insulin glargine (LANTUS) injection 10 Units       insulin aspart (novoLOG) injection 0-9 Units (Start on 08/10/2013  8:00 AM)       Advance diet Up with therapy DVT ppx - lovenox and aspirin per primary team Pain control Discharge planning  Marianna Payment 08/10/2013, 7:45 AM [phone]

## 2013-08-10 NOTE — Progress Notes (Signed)
Shift event: This NP asked to see pt upon transfer from [facility]. She was seen by nephrology earlier today secondary to high creat (acute on chronic kidney disease) and [facility] was asked to transfer pt to [facility] in case dialysis was needed. I reviewed nephrology note which said if pt's mental status doesn't improve then HD may be warranted.  Upon arriving to pt room, she is lying in bed. Easily aroused with name calling. She is completely oriented and sensible in conversation and answering questions. She has no complaints except her bandage/leg is heavy.  Hgb low secondary to being one day postop BKA.  Will recheck Hgb and renal panel now. In comparing earlier notes, it appears she has improved with her mental status. Will cont to follow. Clance Boll, NP [facility]

## 2013-08-10 NOTE — Evaluation (Signed)
Occupational Therapy Evaluation Patient Details Name: [name] MRN: [id number] DOB: [birth year] Today's Date: 08/10/2013 Time: [id number] OT Time Calculation (min): 23 min  OT Assessment / Plan / Recommendation History of present illness 62 yo female h/o dm, pvd, admitted 08/06/13 with left toe infection for weeks; Relatively recent CVA affecting right side. 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 Pt is also in renal failure.   Clinical Impression   Pt presents to OT with dependence in all ADL activity s/p LBKA. Pt will benefit from skilled OT to increase I with ADL activity and return to PLOF    OT Assessment  Patient needs continued OT Services    Follow Up Recommendations  SNF (depending on progress)       Equipment Recommendations  None recommended by OT       Frequency  Min 2X/week    Precautions / Restrictions Precautions Precautions: Fall Restrictions Weight Bearing Restrictions: Yes LLE Weight Bearing: Non weight bearing       ADL  Eating/Feeding: Minimal assistance Where Assessed - Eating/Feeding: Bed level Grooming: Minimal assistance Where Assessed - Grooming: Supine, head of bed up Upper Body Bathing: Maximal assistance Where Assessed - Upper Body Bathing: Supine, head of bed up Lower Body Bathing: +2 Total assistance Lower Body Bathing: Patient Percentage: 30% Where Assessed - Lower Body Bathing: Supine, head of bed up Upper Body Dressing: Maximal assistance Where Assessed - Upper Body Dressing: Supine, head of bed up Lower Body Dressing: +2 Total assistance Lower Body Dressing: Patient Percentage: 30%    OT Diagnosis: Generalized weakness;Acute pain  OT Problem List: Decreased activity tolerance;Decreased strength;Pain;Impaired balance (sitting and/or standing);Decreased knowledge of precautions;Decreased knowledge of use of DME or AE OT Treatment Interventions: Self-care/ADL training;Patient/family education;DME  and/or AE instruction   OT Goals(Current goals can be found in the care plan section) Acute Rehab OT Goals Patient Stated Goal: to be able to get up Time For Goal Achievement: 08/24/13  Visit Information  Last OT Received On: 08/10/13 Assistance Needed: +2 History of Present Illness: 62 yo female h/o dm, pvd, admitted 08/06/13 with left toe infection for weeks; Relatively recent CVA affecting right side. 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 Pt is also in renal failure.       Prior [place] expects to be discharged to:: Private residence Living Arrangements: Children Available Help at Discharge: Family Type of Home: House Home Access: Stairs to enter [employer] of Steps: 4 at side entrance  [place]: Two level;Bed/bath upstairs Alternate Level Stairs-Number of Steps: [place]: [place] - 2 wheels;Bedside commode Communication Communication: No difficulties Dominant Hand: Right         Vision/Perception Vision - History Patient Visual Report: No change from baseline   Cognition  Cognition Arousal/Alertness: Lethargic Behavior During Therapy: Flat affect Overall Cognitive Status: Difficult to assess    Extremity/Trunk Assessment Upper Extremity Assessment Upper Extremity Assessment: Overall WFL for tasks assessed     Mobility Bed Mobility Bed Mobility: Rolling Left;Supine to Sit Rolling Left: 1: +2 Total assist Rolling Left: Patient Percentage: 10% Supine to Sit: 1: +2 Total assist Supine to Sit: Patient Percentage: 10% Details for Bed Mobility Assistance: pt very lethargic but did make some efforts to roll to the Left then partially sat to edge of the bed. Pt was unable to get to fully sitting with legs over the edge.  Balance Balance Balance Assessed: Yes Static Sitting Balance Static Sitting - Comment/# of Minutes: pt unable to sit up at the edge of the  bed.   End of Session OT - End of Session Activity Tolerance: Patient limited by fatigue;Patient limited by lethargy;Patient limited by pain Patient left: in bed;with family/visitor present Nurse Communication: Mobility status  GO     Betsy Pries 08/10/2013, 12:57 PM

## 2013-08-10 NOTE — Progress Notes (Signed)
Physical Therapy Re-evaluation Patient Details Name: [name] MRN: [id number] DOB: 03/26/1951 Today's Date: 08/10/2013 Time: 1208-1226 PT Time Calculation (min): 18 min  PT Assessment / Plan / Recommendation  History of Present Illness 62 yo female h/o dm, pvd, admitted 08/06/13 with left toe infection for weeks; Relatively recent CVA affecting right side. 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 Pt is also in renal failure.   PT Comments   Pt is very lethargic and minimally tolerated mobility due to pain. Pt will benefit from PT while in acute care to improve functional mobility  And instruct in pre prosthetic exercises.Recommend SNF for rehab.  Follow Up Recommendations  SNF     Does the patient have the potential to tolerate intense rehabilitation     Barriers to Discharge        Equipment Recommendations  Wheelchair (measurements PT)    Recommendations for Other Services    Frequency Min 3X/week   Progress towards PT Goals Progress towards PT goals: Goals downgraded-see care plan  Plan Current plan remains appropriate    Precautions / Restrictions Precautions Precautions: Fall   Pertinent Vitals/Pain Pt moaning throughout session. Had medication prior to treatment.    Mobility  Bed Mobility Bed Mobility: Rolling Left;Supine to Sit Rolling Left: 1: +2 Total assist Rolling Left: Patient Percentage: 10% Supine to Sit: 1: +2 Total assist Supine to Sit: Patient Percentage: 10% Details for Bed Mobility Assistance: pt very lethargic but did make some efforts to roll to the Left then partially sat to edge of the bed. Pt was unable to get to fully sitting with legs over the edge. Transfers Transfers: Not assessed    Exercises     PT Diagnosis:    PT Problem List:   PT Treatment Interventions:     PT Goals (current goals can now be found in the care plan section) Acute Rehab PT Goals Patient Stated Goal: agreed to getting  OOB Potential to Achieve Goals: Fair  Visit Information  Last PT Received On: 08/10/13 Assistance Needed: +2 History of Present Illness: 62 yo female h/o dm, pvd, admitted 08/06/13 with left toe infection for weeks; Relatively recent CVA affecting right side. 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 Pt is also in renal failure.    Subjective Data  Patient Stated Goal: agreed to getting OOB   Cognition  Cognition Arousal/Alertness: Lethargic Behavior During Therapy: Flat affect Overall Cognitive Status: Difficult to assess    Balance  Balance Balance Assessed: Yes Static Sitting Balance Static Sitting - Comment/# of Minutes: pt unable to sit up at the edge of the bed.  End of Session PT - End of Session Activity Tolerance: Patient limited by lethargy;Patient limited by pain Patient left: in bed;with call bell/phone within reach;with family/visitor present Nurse Communication: Mobility status;Need for lift equipment [facility]   GP     [name] 08/10/2013, 12:51 PM [name] PT [phone]

## 2013-08-10 NOTE — Progress Notes (Addendum)
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 Her Creatinine significantly worsened over 9/22-9/23 and her UOP decreased despite IVF resuscitation and IV lasix and hence Nephrology was consulted, as she needed Dialysis x 1 in July 2014  Past medical history-As per Problem list Chart reviewed as below-  Consultants:  Ortho-Dr.XU  Procedures:  See above  Transfused 2 U PRBC 9/20  Tranfused 1 U PRBc 9/21  Antibiotics:  Zosyn 9/18-9/21  Vancomycin 9/18-9/21   Subjective  MOre awake.  Rates pain as mod-severe.  tol diet fairly. No n/v, no stool as yet. Working with therapy    Objective    Interim History:   Telemetry: nad  Objective: Filed Vitals:   08/10/13 0231 08/10/13 0522 08/10/13 1000 08/10/13 1308  BP: 133/67 121/49 126/52 114/71  Pulse: 61 61 66 62  Temp: 97.6 F (36.4 C) 97.3 F (36.3 C) 97.9 F (36.6 C) 97.6 F (36.4 C)  TempSrc: Oral Oral Oral Oral  Resp: 18 18 20 18   Height:      Weight:      SpO2: 100% 100% 100% 98%    Intake/Output Summary (Last 24 hours) at 08/10/13 1400 Last data filed at 08/10/13 1300  Gross per 24 hour  Intake 1533.75 ml  Output    195 ml  Net 1338.75 ml    Exam:  General: well, EOMI Cardiovascular: s1 s2 no m/r/g Respiratory: clear Abdomen: soft, NT/ND Skin no LE edema-Area Bandaged Neuro Intact and grossly moving all 4 extremities  Data Reviewed: Basic Metabolic Panel:  Recent Labs Lab 08/07/13 0430 08/08/13 0558 08/08/13 1405 08/09/13 0132 08/10/13 0540  08/10/13 1312  NA 135  --  131* 129* 129* 128*  K 4.3  --  4.2 4.1 4.8 4.7  CL 100  --  97 96 96 94*  CO2 21  --  22 22 18* 18*  GLUCOSE 262*  --  282* 183* 239* 273*  BUN 80*  --  74* 74* 76* 77*  CREATININE 3.14* 3.53* 3.52* 3.74* 5.23* 5.60*  CALCIUM 9.1  --  9.0 8.9 8.6 8.5  PHOS  --   --  5.4* 4.9* 7.4* 7.7*   Liver Function Tests:  Recent Labs Lab 08/06/13 1115 08/08/13 1405 08/09/13 0132 08/10/13 0540 08/10/13 1312  AST 10  --   --   --   --   ALT 7  --   --   --   --   ALKPHOS 125*  --   --   --   --   BILITOT 0.4  --   --   --   --   PROT 8.1  --   --   --   --   ALBUMIN 2.7* 2.1* 2.1* 1.9* 1.9*   No results found for this basename: LIPASE, AMYLASE,  in the last 168 hours No results found for this basename: AMMONIA,  in the last 168 hours CBC:  Recent Labs Lab 08/06/13 1115 08/07/13 0430 08/08/13 1405 08/09/13 0132  WBC 12.2* 10.1 9.4 10.2  NEUTROABS 8.8*  --  6.4 6.5  HGB 7.9* 6.9* 6.1* 6.8*  HCT 24.4* 21.2* 19.5* 21.0*  MCV 82.2 82.8 84.4 84.0  PLT 210 170 164 199   Cardiac Enzymes: No results found for this basename: CKTOTAL, CKMB, CKMBINDEX, TROPONINI,  in the last 168 hours BNP: No components found with this basename: POCBNP,  CBG:  Recent Labs Lab 08/09/13 1129 08/09/13 1644 08/09/13 2129 08/10/13 0713 08/10/13 1129  GLUCAP 240* 294* 239* 225* 262*    Recent Results (from the past 240 hour(s))  CULTURE, BLOOD (ROUTINE X 2)     Status: None   Collection Time    08/06/13 11:15 AM      Result Value Range Status   Specimen Description BLOOD LEFT ANTECUBITAL   Final   Special Requests BOTTLES DRAWN AEROBIC AND ANAEROBIC 5CC   Final   Culture  Setup Time     Final   Value: 08/06/2013 14:49     Performed at [facility]   Culture     Final   Value:        BLOOD CULTURE RECEIVED NO GROWTH TO DATE CULTURE WILL BE HELD FOR 5 DAYS BEFORE ISSUING A FINAL NEGATIVE REPORT     Performed at [facility]   Report Status PENDING    Incomplete  CULTURE, BLOOD (ROUTINE X 2)     Status: None   Collection Time    08/06/13 11:40 AM      Result Value Range Status   Specimen Description BLOOD RIGHT ANTECUBITAL   Final   Special Requests BOTTLES DRAWN AEROBIC AND ANAEROBIC 6ML   Final   Culture  Setup Time     Final   Value: 08/06/2013 14:49     Performed at [facility]   Culture     Final   Value:        BLOOD CULTURE RECEIVED NO GROWTH TO DATE CULTURE WILL BE HELD FOR 5 DAYS BEFORE ISSUING A FINAL NEGATIVE REPORT     Performed at [facility]   Report Status PENDING   Incomplete  MRSA PCR SCREENING     Status: None   Collection Time    08/07/13  6:30 AM      Result Value Range Status   MRSA by PCR NEGATIVE  NEGATIVE Final   Comment:            The GeneXpert MRSA Assay (FDA     approved for NASAL specimens     only), is one component of a     comprehensive MRSA colonization     surveillance program. It is not     intended to diagnose MRSA     infection nor to guide or     monitor treatment for     MRSA infections.     Studies:              All Imaging reviewed and is as per above notation   Scheduled Meds: . aspirin  325 mg Oral Q24H  . atorvastatin  40 mg Oral q1800  . carvedilol  25 mg Oral BID WC  . ferrous sulfate  325 mg Oral Q breakfast  . insulin aspart  0-9 Units Subcutaneous TID WC  . insulin glargine  10 Units Subcutaneous QHS  . niacin  500 mg Oral QHS  . omega-3 acid ethyl esters  1 g Oral TID  . polyethylene glycol  17 g Oral Daily  . pregabalin  75 mg Oral BID  . senna  1  tablet Oral BID   Continuous Infusions: . sodium chloride 75 mL/hr at 08/10/13 1350     Assessment/Plan: 1. S/p Transmet Amputation 9/19/L BKA 9/21-Ortho to address  Pain management, Empiric antibiotics coverage, weight bearing.  Therapy services requested-she will need SNF                       1. Acute renal failure-Potentitially 2/2 to stress of hypotension during 2 surgeries recently,  ABLA as well as Vancomycin use-Nephrology will be seeing today-appreciate input in advance.  Her creat is still peaking, but appears like it may have plateaud.  Continue IV saline 75 cc/hr and avoid further nephrotoxins-if she persistently has decreased UOP, might need Tx to [facility] for dialysis-but no emergent indication currently 2. Metabolic [id number] to #1.  Hold Bicarb until seen by Nephrology 3. Anemia-multifactorial-2/2 to CKD + ABLA-rpt CBc in am.  Transfused x 3 this admit.  Continue FeSo4 325 daily 4. DM ty 2-Blood sugars 225-290. Lantus 10 units.  SSI sensitive 5. H/o CAD/PAD-Asa 325 daily, Cont Coreg 25 bid-Sees [facility] for this.  No further cardiac issues presently.  OP follow up Dr. Ellyn Hack 6. CHF-Euvolemic.  Lasix held  7. CKD stage 4-5-see #1 8. HLD-hold Niacin CR 500 qhs, Lipitor 40 daily given #1 9. Constipation-discontinued Magnesium citrate given CKD, cont Miralax 17 g daily, Senna 1 tab bid, Lactulose 30 prn 10. Neuropathy/Phantom limb prohylaxis-continue Lyrica 50 daily   Code Status: Full  Family Communication: spoke with daugthers in room today Disposition Plan: med-surg   Verneita Griffes, MD  [facility] Pager [phone] 08/10/2013, 2:00 PM    LOS: 4 days

## 2013-08-10 NOTE — Progress Notes (Addendum)
[place] for Medication Dose Adjustments for Renal Insufficiency  No Known Allergies  Intake/Output from previous day: 09/21 0701 - 09/22 0700 In: 3241 [P.O.:100; I.V.:2425; Blood:556; IV Piggyback:160] Out: 515 [Urine:365; Blood:150] Intake/Output from this shift: Total I/O In: 603.8 [P.O.:120; I.V.:483.8] Out: 30 [Urine:30]  Labs:  Recent Labs  08/08/13 1405 08/09/13 0132 08/10/13 0540 08/10/13 0807 08/10/13 1312  WBC 9.4 10.2  --   --   --   HGB 6.1* 6.8*  --   --   --   HCT 19.5* 21.0*  --   --   --   PLT 164 199  --   --   --   CREATININE 3.52* 3.74* 5.23*  --  5.60*  LABCREA  --   --   --  107.30  --   PHOS 5.4* 4.9* 7.4*  --  7.7*  ALBUMIN 2.1* 2.1* 1.9*  --  1.9*    Assessment: 89 yoF with acute on chronic renal disease.  Pharmacy consulted to adjust mediations per renal clearance.   SCr worsening, current estimated CrCl~16.7 ml/min.  Plan:  1.  Lyrica 75 mg PO BID started 9/21.  Will adjust dose to 50 mg PO once daily since this is the recommended dose for CrCl 15-30 ml/min in patients requiring a dose of 150 mg/day.  Will defer to MD to titrate dose based on response since this is a new med but would start low at 50 mg daily based on low CrCl. 2.  No other medications requiring adjustment at this time.  Pharmacy will continue to follow and make recommendations as medications and SCr change.  Thank you for the consult.  [name] 08/10/2013,2:21 PM

## 2013-08-11 ENCOUNTER — Inpatient Hospital Stay (HOSPITAL_COMMUNITY): Payer: Medicare Other

## 2013-08-11 DIAGNOSIS — N179 Acute kidney failure, unspecified: Secondary | ICD-10-CM

## 2013-08-11 DIAGNOSIS — N189 Chronic kidney disease, unspecified: Secondary | ICD-10-CM

## 2013-08-11 DIAGNOSIS — I2510 Atherosclerotic heart disease of native coronary artery without angina pectoris: Secondary | ICD-10-CM

## 2013-08-11 DIAGNOSIS — D638 Anemia in other chronic diseases classified elsewhere: Secondary | ICD-10-CM

## 2013-08-11 LAB — RENAL FUNCTION PANEL
CO2: 17 mEq/L — ABNORMAL LOW (ref 19–32)
Calcium: 8.5 mg/dL (ref 8.4–10.5)
Chloride: 99 mEq/L (ref 96–112)
GFR calc Af Amer: 7 mL/min — ABNORMAL LOW (ref >90)
GFR calc non Af Amer: 6 mL/min — ABNORMAL LOW (ref >90)
Sodium: 133 mEq/L — ABNORMAL LOW (ref 135–145)

## 2013-08-11 LAB — CBC
Hemoglobin: 6.7 g/dL — CL (ref 12.0–15.0)
MCH: 27.6 pg (ref 26.0–34.0)
MCHC: 31.5 g/dL (ref 30.0–36.0)
MCV: 86 fL (ref 78.0–100.0)
Platelets: 207 10*3/uL (ref 150–400)
Platelets: 206 10*3/uL (ref 150–400)
RBC: 2.43 MIL/uL — ABNORMAL LOW (ref 3.87–5.11)
RDW: 17.8 % — ABNORMAL HIGH (ref 11.5–15.5)
RDW: 18.1 % — ABNORMAL HIGH (ref 11.5–15.5)
WBC: 9 10*3/uL (ref 4.0–10.5)
WBC: 8.2 10*3/uL (ref 4.0–10.5)

## 2013-08-11 LAB — PREPARE RBC (CROSSMATCH)

## 2013-08-11 LAB — BASIC METABOLIC PANEL
Calcium: 8.8 mg/dL (ref 8.4–10.5)
Chloride: 96 mEq/L (ref 96–112)
Creatinine, Ser: 6.46 mg/dL — ABNORMAL HIGH (ref 0.50–1.10)
GFR calc Af Amer: 7 mL/min — ABNORMAL LOW (ref >90)
Potassium: 5 mEq/L (ref 3.5–5.1)

## 2013-08-11 LAB — GLUCOSE, CAPILLARY
Glucose-Capillary: 142 mg/dL — ABNORMAL HIGH (ref 70–99)
Glucose-Capillary: 195 mg/dL — ABNORMAL HIGH (ref 70–99)

## 2013-08-11 MED ORDER — LIDOCAINE HCL (PF) 1 % IJ SOLN: 5.0000 mL | INTRAMUSCULAR | Status: DC | PRN

## 2013-08-11 MED ORDER — SODIUM CHLORIDE 0.9 % IV SOLN: 100.0000 mL | INTRAVENOUS | Status: DC | PRN

## 2013-08-11 MED ORDER — HEPARIN SODIUM (PORCINE) 1000 UNIT/ML DIALYSIS
2000.0000 [IU] | INTRAMUSCULAR | Status: DC | PRN
  Filled 2013-08-11: qty 2

## 2013-08-11 MED ORDER — DARBEPOETIN ALFA-POLYSORBATE 100 MCG/0.5ML IJ SOLN
100.0000 ug | INTRAMUSCULAR | Status: DC
  Administered 2013-08-11: 100 ug via INTRAVENOUS
  Filled 2013-08-11: qty 0.5

## 2013-08-11 MED ORDER — SODIUM CHLORIDE 0.9 % IV SOLN
125.0000 mg | Freq: Every day | INTRAVENOUS | Status: DC
  Administered 2013-08-11 – 2013-08-13 (×3): 125 mg via INTRAVENOUS
  Filled 2013-08-11 (×8): qty 10

## 2013-08-11 MED ORDER — PENTAFLUOROPROP-TETRAFLUOROETH EX AERO: 1.0000 "application " | INHALATION_SPRAY | CUTANEOUS | Status: DC | PRN

## 2013-08-11 MED ORDER — NEPRO/CARBSTEADY PO LIQD
237.0000 mL | ORAL | Status: DC | PRN
  Filled 2013-08-11: qty 237

## 2013-08-11 MED ORDER — HEPARIN SODIUM (PORCINE) 1000 UNIT/ML DIALYSIS
1000.0000 [IU] | INTRAMUSCULAR | Status: DC | PRN
  Filled 2013-08-11: qty 1

## 2013-08-11 MED ORDER — LIDOCAINE-PRILOCAINE 2.5-2.5 % EX CREA
1.0000 "application " | TOPICAL_CREAM | CUTANEOUS | Status: DC | PRN
  Filled 2013-08-11: qty 5

## 2013-08-11 MED ORDER — ALTEPLASE 2 MG IJ SOLR
2.0000 mg | Freq: Once | INTRAMUSCULAR | Status: DC | PRN
  Filled 2013-08-11: qty 2

## 2013-08-11 NOTE — Procedures (Signed)
Attempted R IJ temp HD cath, but unable to get good anesthesia and difficult maneuvering the needle due to very tough thick skin.  Pt did not tolerate well so procedure was aborted, have asked IR to place a temp cath and they have graciously agreed. The jugular vein is large on [place].  Plan HD today after cath is in. CXR ordered.  Kelly Splinter  MD Pager [phone]    Cell  [phone] 08/11/2013, 1:23 PM

## 2013-08-11 NOTE — Progress Notes (Signed)
Subjective:  Patient reports pain as moderate.  Transferred to stepdown unit at [facility] yesterday for AMS, uremia.  Objective:   VITALS:   Filed Vitals:   08/10/13 1308 08/10/13 1834 08/11/13 0015 08/11/13 0410  BP: 114/71 122/65 109/49 120/48  Pulse: 62 56 61 61  Temp: 97.6 F (36.4 C) 98 F (36.7 C) 98.1 F (36.7 C) 98.2 F (36.8 C)  TempSrc: Oral Oral Oral Oral  Resp: 18 20 10 18   Height:      Weight:      SpO2: 98% 100% 100% 97%    Neurologically intact Neurovascular intact Intact pulses distally Dorsiflexion/Plantar flexion intact Incision: dressing C/D/I and no drainage No cellulitis present Compartment soft   Lab Results  Component Value Date   WBC 9.0 08/10/2013   HGB 6.7* 08/10/2013   HCT 20.7* 08/10/2013   MCV 85.2 08/10/2013   PLT 207 08/10/2013     Assessment/Plan: 2 Days Post-Op   Problem List Items Addressed This Visit   Type II or unspecified type diabetes mellitus with renal manifestations, 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)   Relevant Medications      Insulin Glargine (LANTUS SOLOSTAR) 100 UNIT/ML SOPN      aspirin 325 MG tablet      aspirin tablet 325 mg      insulin glargine (LANTUS) injection 10 Units      insulin aspart (novoLOG) injection 0-9 Units   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   Relevant Medications      Insulin Glargine (LANTUS SOLOSTAR) 100 UNIT/ML SOPN      aspirin 325 MG tablet      aspirin tablet 325 mg      insulin glargine (LANTUS) injection 10 Units      insulin aspart (novoLOG) injection 0-9 Units   Foot infection   Relevant Medications      ceFAZolin (ANCEF) IVPB 2 g/50 mL premix (Completed)    Other Visit Diagnoses   Toe gangrene    -  Primary    PVD (peripheral vascular disease)        Relevant Medications       aspirin 325 MG tablet       carvedilol (COREG) 25 MG tablet       aspirin tablet 325 mg       furosemide (LASIX) injection 20 mg (Completed)       furosemide (LASIX) injection 40 mg (Completed)    carvedilol (COREG) tablet 6.25 mg    DM type 2, uncontrolled, with lower extremity ulcer        Relevant Medications       Insulin Glargine (LANTUS SOLOSTAR) 100 UNIT/ML SOPN       aspirin 325 MG tablet       aspirin tablet 325 mg       insulin glargine (LANTUS) injection 10 Units       insulin aspart (novoLOG) injection 0-9 Units       Advance diet Up with therapy NWB LLE DVT ppx Pain control Discharge planning per primary team   Marianna Payment 08/11/2013, 8:59 AM [phone]

## 2013-08-11 NOTE — Progress Notes (Signed)
CRITICAL VALUE ALERT  Critical value received:  Hgb 6.7  Date of notification:  08/11/2013  Time of notification:  0106  Critical value read back:yes  Nurse who received alert:  Reino Bellis  MD notified (1st page):  NP Baltazar Najjar  Time of first page:  0110  MD notified (2nd page):  Time of second page:  Responding MD:  NP Baltazar Najjar  Time MD responded:  [id number]

## 2013-08-11 NOTE — Procedures (Signed)
Successful placement of temporary HD catheter with tips terminating within the superior aspect of the right atrium.   The patient tolerated the procedure well without immediate post procedural complication. The catheter is ready for immediate use.

## 2013-08-11 NOTE — Progress Notes (Signed)
Renal Service Daily Progress Note [facility]  Subjective: Looks better, more alert, some arm jerking acc to daughter, but pt is oriented. Ate a little of breakfast  Physical Exam:  Blood pressure 113/51, pulse 61, temperature 98.4 F (36.9 C), temperature source Oral, resp. rate 13, height 5' 2.99" (1.6 m), weight 102.1 kg (225 lb 1.4 oz), SpO2 99.00%. Gen: adult AAF, awake, responds appropriately Neck: + JVD to angle of jaw  Chest: bibasilar rales 1/3 up, no wheezing  CV: regular, 2/6 SEM, no rub or gallop  Abdomen: soft, obese, nontender, + BS, large pannus  Ext: mild diffuse nonpitting edema UE's primarily, L BKA wrapped Neuro: Ox3, mild +asterixis  UA- 0-2 wbc, rbc, 100 protein  Assessment:  1. Acute on CRF- possible AIN from cephalosporin, less likely vanc toxicity, could be hemodynamic also from vol overload. Oliguric, creat rising .6.4 today, showing signs of early uremia. Hold all narcotics, leaning toward HD later today , will reevaluate around noon 2. AMS- due to meds / uremia, better 3. Anemia- prob will need transfusion, tsat 15%, ferritin 1557, start aranesp and IV Fe 4. CKD stage IV, baseline creat 3.0 5. 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 DM, longstanding 7. CVA april 2014 8. Hx MI/CAD 9. Morbid obesity 10. HTN- on coreg, will d/c   P- IV Fe, aranesp, possible HD later with transfusion    Recent Labs Lab 08/10/13 0030 08/10/13 0540 08/10/13 1312 08/11/13 0445  NA 133* 129* 128* 130*  K 5.5* 4.8 4.7 5.0  CL 99 96 94* 96  CO2 17* 18* 18* 16*  GLUCOSE 194* 239* 273* 159*  BUN 80* 76* 77* 83*  CREATININE 6.32* 5.23* 5.60* 6.46*  CALCIUM 8.5 8.6 8.5 8.8  PHOS 8.3* 7.4* 7.7*  --     Recent Labs Lab 08/06/13 1115  08/10/13 0030 08/10/13 0540 08/10/13 1312  AST 10  --   --   --   --   ALT 7  --   --   --   --   ALKPHOS 125*  --   --   --   --   BILITOT 0.4  --   --   --   --   PROT 8.1  --   --   --   --   ALBUMIN 2.7*  < > 2.2* 1.9* 1.9*   < > = values in this interval not displayed.  Recent Labs Lab 08/06/13 1115  08/08/13 1405 08/09/13 0132 08/10/13 0030  WBC 12.2*  < > 9.4 10.2 9.0  NEUTROABS 8.8*  --  6.4 6.5  --   HGB 7.9*  < > 6.1* 6.8* 6.7*  HCT 24.4*  < > 19.5* 21.0* 20.7*  MCV 82.2  < > 84.4 84.0 85.2  PLT 210  < > 164 199 207  < > = values in this interval not displayed. Marland Kitchen aspirin  325 mg Oral Q24H  . insulin aspart  0-9 Units Subcutaneous TID WC  . insulin glargine  10 Units Subcutaneous QHS  . senna  1 tablet Oral BID     acetaminophen, acetaminophen, polyethylene glycol, sorbitol

## 2013-08-11 NOTE — Progress Notes (Signed)
[facility] PROGRESS NOTE  [name] [id number] DOB: [birth year] DOA: 08/06/2013 PCP: Hayden Rasmussen., MD  Assessment/Plan: 1. 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  Therapy services requested-she will need SNF 2. Acute renal failure-Potentitially 2/2 to stress of hypotension during 2 surgeries recently, ABLA as well as Vancomycin use-Nephrology transferred to [facility], ?need for dialysis. Her creat is still rising. Continue IV saline 75 cc/hr and avoid further nephrotoxins, minimal urine in foley 3. Metabolic [id number] to #1.  4. Anemia-multifactorial-2/2 to CKD + ABLA- Transfused x 3 this admit. Continue FeSo4 325 daily- defer to nephro- start of epo vs transfusion 5. DM ty 2-Blood sugars 225-290. Lantus 10 units. SSI sensitive 6. H/o CAD/PAD-Asa 325 daily, Cont Coreg 25 bid-Sees [facility] for this. No further cardiac issues presently. OP follow up Dr. Ellyn Hack 7. CHF-Euvolemic. Lasix held  8. CKD stage 4-5-see #1 9. HLD-hold Niacin CR 500 qhs, Lipitor 40 daily given #1 10. Constipation-discontinued Magnesium citrate given CKD, cont Miralax 17 g daily, Senna 1 tab bid, Lactulose 30 prn 11. Neuropathy/Phantom limb prohylaxis-continue Lyrica 50 daily   Code Status: full Family Communication: patient/daughter Disposition Plan: SDU   Consultants:  nephro  Procedures:    Antibiotics:    HPI/Subjective: Transferred to [facility] from [facility] awake off narcotics  Objective: Filed Vitals:   08/11/13 0410  BP: 120/48  Pulse: 61  Temp: 98.2 F (36.8 C)  Resp: 18    Intake/Output Summary (Last 24 hours) at 08/11/13 0802 Last data filed at 08/11/13 0400  Gross per 24 hour  Intake 753.75 ml  Output    300 ml  Net 453.75 ml   Filed Weights   08/06/13 1900  Weight: 102.1 kg (225 lb 1.4 oz)    Exam:   General:  A+Ox3, NAD  Cardiovascular: rrr  Respiratory: clear  anterior  Abdomen: +BS  Musculoskeletal: no edema in remaining limbs   Data Reviewed: Basic Metabolic Panel:  Recent Labs Lab 08/08/13 1405 08/09/13 0132 08/10/13 0030 08/10/13 0540 08/10/13 1312 08/11/13 0445  NA 131* 129* 133* 129* 128* 130*  K 4.2 4.1 5.5* 4.8 4.7 5.0  CL 97 96 99 96 94* 96  CO2 22 22 17* 18* 18* 16*  GLUCOSE 282* 183* 194* 239* 273* 159*  BUN 74* 74* 80* 76* 77* 83*  CREATININE 3.52* 3.74* 6.32* 5.23* 5.60* 6.46*  CALCIUM 9.0 8.9 8.5 8.6 8.5 8.8  PHOS 5.4* 4.9* 8.3* 7.4* 7.7*  --    Liver Function Tests:  Recent Labs Lab 08/06/13 1115 08/08/13 1405 08/09/13 0132 08/10/13 0030 08/10/13 0540 08/10/13 1312  AST 10  --   --   --   --   --   ALT 7  --   --   --   --   --   ALKPHOS 125*  --   --   --   --   --   BILITOT 0.4  --   --   --   --   --   PROT 8.1  --   --   --   --   --   ALBUMIN 2.7* 2.1* 2.1* 2.2* 1.9* 1.9*   No results found for this basename: LIPASE, AMYLASE,  in the last 168 hours No results found for this basename: AMMONIA,  in the last 168 hours CBC:  Recent Labs Lab 08/06/13 1115 08/07/13 0430 08/08/13 1405 08/09/13 0132 08/10/13 0030  WBC 12.2* 10.1 9.4 10.2 9.0  NEUTROABS  8.8*  --  6.4 6.5  --   HGB 7.9* 6.9* 6.1* 6.8* 6.7*  HCT 24.4* 21.2* 19.5* 21.0* 20.7*  MCV 82.2 82.8 84.4 84.0 85.2  PLT 210 170 164 199 207   Cardiac Enzymes: No results found for this basename: CKTOTAL, CKMB, CKMBINDEX, TROPONINI,  in the last 168 hours BNP (last 3 results)  Recent Labs  09/25/12 1250  PROBNP 1010.0*   CBG:  Recent Labs Lab 08/09/13 1644 08/09/13 2129 08/10/13 0713 08/10/13 1129 08/10/13 1659  GLUCAP 294* 239* 225* 262* 218*    Recent Results (from the past 240 hour(s))  CULTURE, BLOOD (ROUTINE X 2)     Status: None   Collection Time    08/06/13 11:15 AM      Result Value Range Status   Specimen Description BLOOD LEFT ANTECUBITAL   Final   Special Requests BOTTLES DRAWN AEROBIC AND ANAEROBIC 5CC   Final    Culture  Setup Time     Final   Value: 08/06/2013 14:49     Performed at [facility]   Culture     Final   Value:        BLOOD CULTURE RECEIVED NO GROWTH TO DATE CULTURE WILL BE HELD FOR 5 DAYS BEFORE ISSUING A FINAL NEGATIVE REPORT     Performed at [facility]   Report Status PENDING   Incomplete  CULTURE, BLOOD (ROUTINE X 2)     Status: None   Collection Time    08/06/13 11:40 AM      Result Value Range Status   Specimen Description BLOOD RIGHT ANTECUBITAL   Final   Special Requests BOTTLES DRAWN AEROBIC AND ANAEROBIC 6ML   Final   Culture  Setup Time     Final   Value: 08/06/2013 14:49     Performed at [facility]   Culture     Final   Value:        BLOOD CULTURE RECEIVED NO GROWTH TO DATE CULTURE WILL BE HELD FOR 5 DAYS BEFORE ISSUING A FINAL NEGATIVE REPORT     Performed at [facility]   Report Status PENDING   Incomplete  MRSA PCR SCREENING     Status: None   Collection Time    08/07/13  6:30 AM      Result Value Range Status   MRSA by PCR NEGATIVE  NEGATIVE Final   Comment:            The GeneXpert MRSA Assay (FDA     approved for NASAL specimens     only), is one component of a     comprehensive MRSA colonization     surveillance program. It is not     intended to diagnose MRSA     infection nor to guide or     monitor treatment for     MRSA infections.     Studies: Dg Chest Port 1 View  08/10/2013   CLINICAL DATA:  Volume overload. Chronic kidney disease. Question pulmonary edema.  EXAM: PORTABLE CHEST - 1 VIEW  COMPARISON:  03/18/2013.  FINDINGS: 1857 hr. There is stable cardiomegaly and chronic vascular congestion. No overt pulmonary edema, confluent airspace opacity or significant pleural effusion is identified.  IMPRESSION: Stable cardiomegaly and chronic vascular congestion. No overt pulmonary edema identified.   Electronically Signed   By: Camie Patience   On: 08/10/2013 19:14    Scheduled Meds: . aspirin  325 mg Oral Q24H   . carvedilol  6.25 mg Oral BID WC  . insulin aspart  0-9 Units Subcutaneous TID WC  . insulin glargine  10 Units Subcutaneous QHS  . polyethylene glycol  17 g Oral Daily  . 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 If 7PM-7AM, please contact night-coverage at [email or web address], password [facility] 08/11/2013, 8:02 AM  LOS: 5 days

## 2013-08-12 LAB — TYPE AND SCREEN
ABO/RH(D): O POS
ABO/RH(D): O POS
Antibody Screen: NEGATIVE
Antibody Screen: NEGATIVE
Unit division: 0
Unit division: 0
Unit division: 0
Unit division: 0

## 2013-08-12 LAB — RENAL FUNCTION PANEL
Albumin: 1.9 g/dL — ABNORMAL LOW (ref 3.5–5.2)
BUN: 51 mg/dL — ABNORMAL HIGH (ref 6–23)
CO2: 23 mEq/L (ref 19–32)
Chloride: 101 mEq/L (ref 96–112)
Creatinine, Ser: 3.21 mg/dL — ABNORMAL HIGH (ref 0.50–1.10)
GFR calc Af Amer: 17 mL/min — ABNORMAL LOW (ref >90)
GFR calc non Af Amer: 14 mL/min — ABNORMAL LOW (ref >90)
Phosphorus: 4.1 mg/dL (ref 2.3–4.6)
Potassium: 3.9 mEq/L (ref 3.5–5.1)

## 2013-08-12 LAB — GLUCOSE, CAPILLARY
Glucose-Capillary: 184 mg/dL — ABNORMAL HIGH (ref 70–99)
Glucose-Capillary: 237 mg/dL — ABNORMAL HIGH (ref 70–99)
Glucose-Capillary: 267 mg/dL — ABNORMAL HIGH (ref 70–99)
Glucose-Capillary: 203 mg/dL — ABNORMAL HIGH (ref 70–99)

## 2013-08-12 LAB — CULTURE, BLOOD (ROUTINE X 2): Culture: NO GROWTH

## 2013-08-12 LAB — CBC
HCT: 25.4 % — ABNORMAL LOW (ref 36.0–46.0)
Hemoglobin: 8.4 g/dL — ABNORMAL LOW (ref 12.0–15.0)
MCH: 27.5 pg (ref 26.0–34.0)
MCHC: 33.1 g/dL (ref 30.0–36.0)
RBC: 3.05 MIL/uL — ABNORMAL LOW (ref 3.87–5.11)

## 2013-08-12 MED ORDER — OXYCODONE HCL 5 MG PO TABS
5.0000 mg | ORAL_TABLET | Freq: Four times a day (QID) | ORAL | Status: DC | PRN
  Administered 2013-08-12 – 2013-08-13 (×3): 5 mg via ORAL
  Filled 2013-08-12 (×3): qty 1

## 2013-08-12 MED ORDER — PNEUMOCOCCAL VAC POLYVALENT 25 MCG/0.5ML IJ INJ: 0.5000 mL | INJECTION | INTRAMUSCULAR | Status: DC | PRN

## 2013-08-12 MED FILL — Heparin Sodium (Porcine) Inj 1000 Unit/ML: INTRAMUSCULAR | Qty: 10 | Status: AC

## 2013-08-12 NOTE — Progress Notes (Signed)
1530 report called to RN on 4E. 1540Patient TX via bed to 4E30. Nurse and tech received patient.

## 2013-08-12 NOTE — Progress Notes (Addendum)
Renal Service Daily Progress Note [facility]  Subjective: 2kg off w HD yest, 2u prbc transfused, Hb up 8.4 today. Feeling better, pain in stump, ate a good breakfast   Physical Exam:  Blood pressure 131/54, pulse 66, temperature 98.7 F (37.1 C), temperature source Oral, resp. rate 14, height 5' 2.99" (1.6 m), weight 106.7 kg (235 lb 3.7 oz), SpO2 99.00%. Gen: adult AAF, alert, no distress Neck: + JVD to angle of jaw  Chest: faint crackles, L > R base CV: regular, 2/6 SEM, no rub or gallop  Abdomen: soft, obese, nontender, + BS, large pannus  Ext: mild diffuse nonpitting edema UE's primarily, L BKA wrapped Neuro: Ox3, no asterixis  UA- 0-2 wbc, rbc, 100 protein  Assessment/Plan:  1. Acute on CRF- after BKA surgery, unclear cause (AIN from abx, hemodynamic), dialyzed yesterday, UOP better now .  Will watch today, hold HD for now. OK to move out of SDU 2. AMS- resolved 3. Anemia- transfused 9/23, started aranesp and IV Fe 4. CKD stage IV, baseline creat 3.0 5. 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 DM, longstanding 7. CVA april 2014 8. Hx MI/CAD 9. Morbid obesity 10. HTN- holding coreg  Kelly Splinter  MD Pager [phone]    Cell  [phone] 08/12/2013, 5:01 PM       Recent Labs Lab 08/10/13 0030 08/10/13 0540 08/10/13 1312 08/11/13 0445  NA 133* 129* 128* 130*  K 5.5* 4.8 4.7 5.0  CL 99 96 94* 96  CO2 17* 18* 18* 16*  GLUCOSE 194* 239* 273* 159*  BUN 80* 76* 77* 83*  CREATININE 6.32* 5.23* 5.60* 6.46*  CALCIUM 8.5 8.6 8.5 8.8  PHOS 8.3* 7.4* 7.7*  --     Recent Labs Lab 08/06/13 1115  08/10/13 0030 08/10/13 0540 08/10/13 1312  AST 10  --   --   --   --   ALT 7  --   --   --   --   ALKPHOS 125*  --   --   --   --   BILITOT 0.4  --   --   --   --   PROT 8.1  --   --   --   --   ALBUMIN 2.7*  < > 2.2* 1.9* 1.9*  < > = values in this interval not displayed.  Recent Labs Lab 08/06/13 1115  08/08/13 1405 08/09/13 0132 08/10/13 0030  08/11/13 0445 08/12/13 0500  WBC 12.2*  < > 9.4 10.2 9.0 8.2 8.1  NEUTROABS 8.8*  --  6.4 6.5  --   --   --   HGB 7.9*  < > 6.1* 6.8* 6.7* 6.4* 8.4*  HCT 24.4*  < > 19.5* 21.0* 20.7* 20.3* 25.4*  MCV 82.2  < > 84.4 84.0 85.2 86.0 83.3  PLT 210  < > 164 199 207 206 219  < > = values in this interval not displayed. Marland Kitchen aspirin  325 mg Oral Q24H  . darbepoetin (ARANESP) injection - DIALYSIS  100 mcg Intravenous Q Tue-HD  . ferric gluconate (FERRLECIT/NULECIT) IV  125 mg Intravenous Daily  . insulin aspart  0-9 Units Subcutaneous TID WC  . insulin glargine  10 Units Subcutaneous QHS  . senna  1 tablet Oral BID     acetaminophen, acetaminophen, polyethylene glycol, sorbitol

## 2013-08-12 NOTE — Progress Notes (Signed)
Physical Therapy Treatment Patient Details Name: [name] MRN: [id number] DOB: Jul 05, 1951 Today's Date: 08/12/2013 Time: [id number] PT Time Calculation (min): 20 min  PT Assessment / Plan / Recommendation  History of Present Illness Pt s/p L BKA 9/21. Pt L LE NWB.   PT Comments   Pt with recent cast application this date. Pt with increased pain limiting ability to move L LE and tolerate activity today. Pt was able to stand with maxAx2. Pt remains appropriate for SNF to maximize functional recovery and indep with transfers oob to/from w/c.   Follow Up Recommendations  SNF     Does the patient have the potential to tolerate intense rehabilitation     Barriers to Discharge        Equipment Recommendations  Wheelchair (measurements PT)    Recommendations for Other Services    Frequency Min 3X/week   Progress towards PT Goals Progress towards PT goals: Progressing toward goals  Plan Current plan remains appropriate    Precautions / Restrictions Precautions Precautions:  (L BKA with case) Restrictions Weight Bearing Restrictions: Yes LLE Weight Bearing: Non weight bearing   Pertinent Vitals/Pain 9/10 L LE pain    Mobility  Bed Mobility Bed Mobility: Supine to Sit;Sit to Supine Supine to Sit: 1: +2 Total assist;HOB elevated Supine to Sit: Patient Percentage: 20% Sit to Supine: 1: +2 Total assist;HOB flat Sit to Supine: Patient Percentage: 10% Details for Bed Mobility Assistance: max directional verbal and tactile cues, maxA for L LE management, maxA for trunk eleavtion and to bring hips to EOB Transfers Transfers: Sit to Stand;Stand to Sit Sit to Stand: From bed;1: +2 Total assist;With upper extremity assist Sit to Stand: Patient Percentage: 40% Stand to Sit: 1: +2 Total assist;To bed;With upper extremity assist Stand to Sit: Patient Percentage: 50% Details for Transfer Assistance: max directional v/c's, R knee blocked. unable to pvt on ball of R foot. daughter held L  residual limb due to pain Ambulation/Gait Ambulation/Gait Assistance: Not tested (comment)    Exercises     PT Diagnosis:    PT Problem List:   PT Treatment Interventions:     PT Goals (current goals can now be found in the care plan section)    Visit Information  Last PT Received On: 08/12/13 Assistance Needed: +2 History of Present Illness: Pt s/p L BKA 9/21. Pt L LE NWB.    Subjective Data  Subjective: Pt with report of pain in L LE due to recent cast placement   Cognition  Cognition Arousal/Alertness: Lethargic Behavior During Therapy: Flat affect Overall Cognitive Status: Impaired/Different from baseline Area of Impairment: Following commands Following Commands: Follows one step commands inconsistently Problem Solving: Slow processing;Difficulty sequencing;Requires verbal cues;Requires tactile cues    Balance  Balance Balance Assessed: Yes Static Sitting Balance Static Sitting - Balance Support: Bilateral upper extremity supported Static Sitting - Level of Assistance: 5: Stand by assistance Static Sitting - Comment/# of Minutes: 3 min  End of Session PT - End of Session Equipment Utilized During Treatment: Gait belt Activity Tolerance: Patient limited by lethargy;Patient limited by pain Patient left: in bed;with call bell/phone within reach;with family/visitor present Nurse Communication: Mobility status   GP     Kingsley Callander 08/12/2013, 2:40 PM Kittie Plater, PT, DPT Pager #: [phone] Office #: [phone]

## 2013-08-12 NOTE — Progress Notes (Signed)
PATIENT DETAILS Name: [name] Age: 62 y.o. 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 Continue FeSo4 325 daily- defer to nephro- start of epo   DM -CBG's stable -c/w Lantus and SSI  Dyslipidemia -resume statins on discharge  H/o CAD/PAD/CVA -Asa 325 daily -resume statins when able -resume beta blockers when able  Morbid Obesity -counseled   Disposition: Remain inpatient  DVT Prophylaxis:  SCD's  Code Status: Full code   Family Communication None at bedside  Procedures: - 9/19/L and BKA 9/21  CONSULTS:  nephrology and orthopedic surgery   MEDICATIONS: Scheduled Meds: . aspirin  325 mg Oral Q24H  . darbepoetin (ARANESP) injection - DIALYSIS  100 mcg Intravenous Q Tue-HD  . ferric gluconate (FERRLECIT/NULECIT) IV  125 mg Intravenous Daily  . insulin aspart  0-9 Units Subcutaneous TID WC  . insulin glargine  10 Units Subcutaneous QHS  . senna  1 tablet Oral BID   Continuous Infusions:  PRN Meds:.acetaminophen, acetaminophen, pneumococcal 23 valent vaccine, polyethylene glycol, sorbitol  Antibiotics: Anti-infectives   Start     Dose/Rate Route Frequency Ordered Stop   08/09/13 1200  ceFAZolin (ANCEF) IVPB 2 g/50 mL premix     2 g 100 mL/hr over 30 Minutes Intravenous Every 6 hours 08/09/13 1122 08/09/13 2349   08/07/13 1200  piperacillin-tazobactam (ZOSYN) IVPB 2.25 g  Status:  Discontinued     2.25 g 100 mL/hr over 30 Minutes  Intravenous 4 times per day 08/07/13 1132 08/09/13 1122   08/07/13 0600  ceFAZolin (ANCEF) IVPB 2 g/50 mL premix  Status:  Discontinued    Comments:  For pre-incisional abx.   2 g 100 mL/hr over 30 Minutes Intravenous 3 times per day 08/06/13 1439 08/06/13 1500   08/06/13 2100  piperacillin-tazobactam (ZOSYN) IVPB 3.375 g  Status:  Discontinued     3.375 g 12.5 mL/hr over 240 Minutes Intravenous Every 8 hours 08/06/13 1959 08/07/13 1132   08/06/13 1100  piperacillin-tazobactam (ZOSYN) IVPB 3.375 g     3.375 g 100 mL/hr over 30 Minutes Intravenous  Once 08/06/13 1046 08/06/13 1206   08/06/13 1030  vancomycin (VANCOCIN) 1,500 mg in sodium chloride 0.9 % 500 mL IVPB     1,500 mg 250 mL/hr over 120 Minutes Intravenous STAT 08/06/13 1000 08/06/13 1438   08/01/13 1200  vancomycin (VANCOCIN) 1,500 mg in sodium chloride 0.9 % 500 mL IVPB  Status:  Discontinued     1,500 mg 250 mL/hr over 120 Minutes Intravenous Every 48 hours 08/06/13 1345 08/09/13 1122       PHYSICAL EXAM: Vital signs in last 24 hours: Filed Vitals:   08/11/13 2340 08/12/13 0330 08/12/13 0700 08/12/13 0750  BP: 137/50 131/54  127/48  Pulse: 74 66  66  Temp: 99.2 F (37.3 C) 98.7 F (37.1 C) 98.7 F (37.1 C)   TempSrc: Oral Oral Oral   Resp:  14  20  Height:      Weight:      SpO2: 93% 99%  95%    Weight change:  [employer]  08/06/13 1900 08/11/13 1705 08/11/13 1949  Weight: 102.1 kg (225 lb 1.4 oz) 109.2 kg (240 lb 11.9 oz) 106.7 kg (235 lb 3.7 oz)   Body mass index is 41.68 kg/(m^2).   Gen Exam: Awake and alert with clear speech.   Neck: Supple, No JVD.   Chest: B/L Clear.   CVS: S1 S2 Regular, no murmurs.  Abdomen: soft, BS +, non tender, non distended.  Extremities: no edema, left BKA Neurologic: Non Focal.   Skin: No Rash.   Wounds: N/A.   Intake/Output from previous day:  Intake/Output Summary (Last 24 hours) at 08/12/13 1326 Last data filed at 08/12/13 0800  Gross per 24 hour  Intake    1094 ml  Output   3293 ml  Net  -2199 ml     LAB RESULTS: CBC  Recent Labs Lab 08/06/13 1115  08/08/13 1405 08/09/13 0132 08/10/13 0030 08/11/13 0445 08/12/13 0500  WBC 12.2*  < > 9.4 10.2 9.0 8.2 8.1  HGB 7.9*  < > 6.1* 6.8* 6.7* 6.4* 8.4*  HCT 24.4*  < > 19.5* 21.0* 20.7* 20.3* 25.4*  PLT 210  < > 164 199 207 206 219  MCV 82.2  < > 84.4 84.0 85.2 86.0 83.3  MCH 26.6  < > 26.4 27.2 27.6 27.1 27.5  MCHC 32.4  < > 31.3 32.4 32.4 31.5 33.1  RDW 16.6*  < > 16.8* 16.7* 17.8* 18.1* 16.6*  LYMPHSABS 2.1  --  1.8 2.3  --   --   --   MONOABS 1.2*  --  1.1* 1.1*  --   --   --   EOSABS 0.1  --  0.1 0.3  --   --   --   BASOSABS 0.0  --  0.0 0.0  --   --   --   < > = values in this interval not displayed.  Chemistries   Recent Labs Lab 08/10/13 0030 08/10/13 0540 08/10/13 1312 08/11/13 0445 08/12/13 1130  NA 133* 129* 128* 130* 135  K 5.5* 4.8 4.7 5.0 3.9  CL 99 96 94* 96 101  CO2 17* 18* 18* 16* 23  GLUCOSE 194* 239* 273* 159* 272*  BUN 80* 76* 77* 83* 51*  CREATININE 6.32* 5.23* 5.60* 6.46* 3.21*  CALCIUM 8.5 8.6 8.5 8.8 8.4    CBG:  Recent Labs Lab 08/11/13 0823 08/11/13 1147 08/11/13 2146 08/12/13 0820 08/12/13 1247  GLUCAP 142* 195* 184* 208* 237*    GFR Estimated Creatinine Clearance: 21.3 ml/min (by C-G formula based on Cr of 3.21).  Coagulation profile No results found for this basename: INR, PROTIME,  in the last 168 hours  Cardiac Enzymes No results found for this basename: CK, CKMB, TROPONINI, MYOGLOBIN,  in the last 168 hours  No components found with this basename: POCBNP,  No results found for this basename: DDIMER,  in the last 72 hours No results found for this basename: HGBA1C,  in the last 72 hours No results found for this basename: CHOL, HDL, LDLCALC, TRIG, CHOLHDL, LDLDIRECT,  in the last 72 hours No results found for this basename: TSH, T4TOTAL, FREET3, T3FREE, THYROIDAB,  in the last 72 hours No results found for this basename:  VITAMINB12, FOLATE, FERRITIN, TIBC, IRON, RETICCTPCT,  in the last 72 hours No results found for this basename: LIPASE, AMYLASE,  in the last 72 hours  Urine Studies No results found for this basename: UACOL, UAPR, USPG, UPH, UTP, UGL, UKET, UBIL, UHGB, UNIT, UROB, ULEU, UEPI,  UWBC, URBC, UBAC, CAST, CRYS, UCOM, BILUA,  in the last 72 hours  MICROBIOLOGY: Recent Results (from the past 240 hour(s))  CULTURE, BLOOD (ROUTINE X 2)     Status: None   Collection Time    08/06/13 11:15 AM      Result Value Range Status   Specimen Description BLOOD LEFT ANTECUBITAL   Final   Special Requests BOTTLES DRAWN AEROBIC AND ANAEROBIC 5CC   Final   Culture  Setup Time     Final   Value: 08/06/2013 14:49     Performed at [facility]   Culture     Final   Value: NO GROWTH 5 DAYS     Performed at [facility]   Report Status 08/12/2013 FINAL   Final  CULTURE, BLOOD (ROUTINE X 2)     Status: None   Collection Time    08/06/13 11:40 AM      Result Value Range Status   Specimen Description BLOOD RIGHT ANTECUBITAL   Final   Special Requests BOTTLES DRAWN AEROBIC AND ANAEROBIC 6ML   Final   Culture  Setup Time     Final   Value: 08/06/2013 14:49     Performed at [facility]   Culture     Final   Value: NO GROWTH 5 DAYS     Performed at [facility]   Report Status 08/12/2013 FINAL   Final  MRSA PCR SCREENING     Status: None   Collection Time    08/07/13  6:30 AM      Result Value Range Status   MRSA by PCR NEGATIVE  NEGATIVE Final   Comment:            The GeneXpert MRSA Assay (FDA     approved for NASAL specimens     only), is one component of a     comprehensive MRSA colonization     surveillance program. It is not     intended to diagnose MRSA     infection nor to guide or     monitor treatment for     MRSA infections.    RADIOLOGY STUDIES/RESULTS: Ir Fluoro Guide Cv Line Right  08/11/2013   *RADIOLOGY REPORT*  Indication: Worsening renal function,  in need of intravenous access for initiation of dialysis  NON-TUNNELED CENTRAL VENOUS HEMODIALYSIS CATHETER PLACEMENT WITH ULTRASOUND AND FLUOROSCOPIC GUIDANCE  Comparisons: Ultrasound and fluoroscopic guided temporary dialysis catheter placement - 06/04/2013  Intravenous Medications: None  Contrast: None  Fluoroscopy Time: 12 seconds  Complications: None immediate  Findings / Procedure:  Informed written consent was obtained from the patient after a discussion of the risks, benefits, and alternatives to treatment. Questions regarding the procedure were encouraged and answered. The right neck and chest were prepped with chlorhexidine in a sterile fashion, and a sterile drape was applied covering the operative field.  Maximum barrier sterile technique with sterile gowns and gloves were used for the procedure.  A timeout was performed prior to the initiation of the procedure.  After creating a small venotomy incision, a micropuncture kit was utilized to access the right internal jugular vein under direct, real-time ultrasound guidance after the overlying soft tissues were anesthetized with 1% lidocaine with epinephrine.  Ultrasound image documentation was performed.   The microwire was kinked to measure appropriate catheter length.  A stiff glidewire was advanced to the level of the IVC.  Under fluoroscopic guidance, the venotomy was serially dilated, ultimately allowing placement of  a 20 cm temporary Trialysis catheter with tip ultimately terminating within the superior aspect of the right atrium.  Final catheter positioning was confirmed and documented with a spot radiographic image.  The catheter aspirates and flushes normally.  The catheter was flushed with appropriate volume heparin dwells.  The catheter exit site was secured with a 0-Prolene retention suture.  A dressing was placed.  The patient tolerated the procedure well without immediate post procedural complication.  IMPRESSION:  Successful placement of 20  cm temporary dialysis catheter with tip terminating within the superior aspect of the right atrium.  This catheter may be converted to a tunneled dialysis catheter at a later date as indicated.  The catheter is ready for immediate use.   Original Report Authenticated By: Jake Seats, MD   Ir US Guide Vasc Access Right  08/11/2013   *RADIOLOGY REPORT*  Indication: Worsening renal function, in need of intravenous access for initiation of dialysis  NON-TUNNELED CENTRAL VENOUS HEMODIALYSIS CATHETER PLACEMENT WITH ULTRASOUND AND FLUOROSCOPIC GUIDANCE  Comparisons: Ultrasound and fluoroscopic guided temporary dialysis catheter placement - 06/04/2013  Intravenous Medications: None  Contrast: None  Fluoroscopy Time: 12 seconds  Complications: None immediate  Findings / Procedure:  Informed written consent was obtained from the patient after a discussion of the risks, benefits, and alternatives to treatment. Questions regarding the procedure were encouraged and answered. The right neck and chest were prepped with chlorhexidine in a sterile fashion, and a sterile drape was applied covering the operative field.  Maximum barrier sterile technique with sterile gowns and gloves were used for the procedure.  A timeout was performed prior to the initiation of the procedure.  After creating a small venotomy incision, a micropuncture kit was utilized to access the right internal jugular vein under direct, real-time ultrasound guidance after the overlying soft tissues were anesthetized with 1% lidocaine with epinephrine.  Ultrasound image documentation was performed.   The microwire was kinked to measure appropriate catheter length.  A stiff glidewire was advanced to the level of the IVC.  Under fluoroscopic guidance, the venotomy was serially dilated, ultimately allowing placement of a 20 cm temporary Trialysis catheter with tip ultimately terminating within the superior aspect of the right atrium.  Final catheter positioning was  confirmed and documented with a spot radiographic image.  The catheter aspirates and flushes normally.  The catheter was flushed with appropriate volume heparin dwells.  The catheter exit site was secured with a 0-Prolene retention suture.  A dressing was placed.  The patient tolerated the procedure well without immediate post procedural complication.  IMPRESSION:  Successful placement of 20 cm temporary dialysis catheter with tip terminating within the superior aspect of the right atrium.  This catheter may be converted to a tunneled dialysis catheter at a later date as indicated.  The catheter is ready for immediate use.   Original Report Authenticated By: Jake Seats, MD   Dg Chest Port 1 View  08/11/2013   CLINICAL DATA:  Line placement.  EXAM: PORTABLE CHEST - 1 VIEW  COMPARISON:  08/10/2013  FINDINGS: The heart is enlarged. Shallow lung inflation. No pneumothorax. No central lines are identified despite the history. Support devices overlie the patient. At the right lung base, markings appear more chronic, likely related to shallow inflation. However, findings could be related to increased atelectasis or early developing infiltrate.  IMPRESSION: 1. No evidence for central line despite the history. 2. Cardiomegaly. 3. Shallow lung inflation. 4. Right lung base crowded markings versus atelectasis/infiltrate.  These results will be called to the ordering clinician or representative by the technologist, and communication documented in the PACS Dashboard.   Electronically Signed   By: Shon Hale M.D.   On: 08/11/2013 14:06   Dg Chest Port 1 View  08/10/2013   CLINICAL DATA:  Volume overload. Chronic kidney disease. Question pulmonary edema.  EXAM: PORTABLE CHEST - 1 VIEW  COMPARISON:  03/18/2013.  FINDINGS: 1857 hr. There is stable cardiomegaly and chronic vascular congestion. No overt pulmonary edema, confluent airspace opacity or significant pleural effusion is identified.  IMPRESSION: Stable cardiomegaly  and chronic vascular congestion. No overt pulmonary edema identified.   Electronically Signed   By: Camie Patience   On: 08/10/2013 19:14   Dg Foot Complete Left  08/06/2013   CLINICAL DATA:  Blister on the 2nd toe, worsening.  EXAM: LEFT FOOT - COMPLETE 3+ VIEW  COMPARISON:  Plain films 05/1913 and 05/30/2013.  FINDINGS: The patient's claw second toe is in flexion somewhat limiting evaluation. No bony destructive change or soft tissue gas collection is identified. No radiopaque foreign body is seen. There is no fracture or dislocation. Extensive atherosclerosis is noted. Large calcaneal spur and midfoot degenerative change are seen.  IMPRESSION: Negative for plain film evidence of osteomyelitis. No acute finding.   Electronically Signed   By: Inge Rise M.D.   On: 08/06/2013 10:56    Oren Binet, MD  [facility] Pager:[phone]  If 7PM-7AM, please contact night-coverage [email or web address] Password [email or web address] 08/12/2013, 1:26 PM   LOS: 6 days

## 2013-08-13 LAB — CBC
HCT: 27.2 % — ABNORMAL LOW (ref 36.0–46.0)
Hemoglobin: 8.9 g/dL — ABNORMAL LOW (ref 12.0–15.0)
MCH: 27.7 pg (ref 26.0–34.0)
MCHC: 32.7 g/dL (ref 30.0–36.0)
MCV: 84.7 fL (ref 78.0–100.0)
Platelets: 251 10*3/uL (ref 150–400)
RDW: 16.4 % — ABNORMAL HIGH (ref 11.5–15.5)
WBC: 8.3 10*3/uL (ref 4.0–10.5)

## 2013-08-13 LAB — GLUCOSE, CAPILLARY
Glucose-Capillary: 192 mg/dL — ABNORMAL HIGH (ref 70–99)
Glucose-Capillary: 184 mg/dL — ABNORMAL HIGH (ref 70–99)
Glucose-Capillary: 195 mg/dL — ABNORMAL HIGH (ref 70–99)

## 2013-08-13 LAB — RENAL FUNCTION PANEL
Albumin: 1.9 g/dL — ABNORMAL LOW (ref 3.5–5.2)
BUN: 41 mg/dL — ABNORMAL HIGH (ref 6–23)
Calcium: 8.9 mg/dL (ref 8.4–10.5)
Chloride: 103 mEq/L (ref 96–112)
Creatinine, Ser: 2.28 mg/dL — ABNORMAL HIGH (ref 0.50–1.10)
GFR calc non Af Amer: 22 mL/min — ABNORMAL LOW (ref >90)
Glucose, Bld: 174 mg/dL — ABNORMAL HIGH (ref 70–99)
Phosphorus: 2.9 mg/dL (ref 2.3–4.6)
Potassium: 3.6 mEq/L (ref 3.5–5.1)
Sodium: 137 mEq/L (ref 135–145)

## 2013-08-13 MED ORDER — OXYCODONE HCL 5 MG PO TABS
5.0000 mg | ORAL_TABLET | Freq: Four times a day (QID) | ORAL | Status: DC | PRN
  Administered 2013-08-13 – 2013-08-14 (×2): 5 mg via ORAL
  Filled 2013-08-13 (×2): qty 1

## 2013-08-13 MED ORDER — GABAPENTIN 100 MG PO CAPS
100.0000 mg | ORAL_CAPSULE | Freq: Two times a day (BID) | ORAL | Status: DC
  Administered 2013-08-13 – 2013-08-14 (×3): 100 mg via ORAL
  Filled 2013-08-13 (×4): qty 1

## 2013-08-13 MED ORDER — CARVEDILOL 12.5 MG PO TABS
12.5000 mg | ORAL_TABLET | Freq: Two times a day (BID) | ORAL | Status: DC
  Administered 2013-08-13 – 2013-08-14 (×2): 12.5 mg via ORAL
  Filled 2013-08-13 (×4): qty 1

## 2013-08-13 MED ORDER — ATORVASTATIN CALCIUM 40 MG PO TABS
40.0000 mg | ORAL_TABLET | Freq: Every day | ORAL | Status: DC
  Administered 2013-08-13: 17:00:00 40 mg via ORAL
  Filled 2013-08-13 (×2): qty 1

## 2013-08-13 MED ORDER — INSULIN GLARGINE 100 UNIT/ML ~~LOC~~ SOLN
15.0000 [IU] | Freq: Every day | SUBCUTANEOUS | Status: DC
  Administered 2013-08-13: 15 [IU] via SUBCUTANEOUS
  Filled 2013-08-13 (×2): qty 0.15

## 2013-08-13 NOTE — Progress Notes (Signed)
Per MD order, central HD cath removed.HD cathter tip intact. Vaseline pressure gauze to site, pressure held x 15 min by me followed by 15 min by unit staff. No bleeding to site. Pt instructed not to get out of bed for 30 min after the removal of the central line. Instucted to keep dressing CDI x 24hours, if bleeding occurs hold pressure, if bleeding does not stop contact unit nurse. Pt verbalized understanding and did not have any questions. [employer]

## 2013-08-13 NOTE — Progress Notes (Signed)
Occupational Therapy Treatment Patient Details Name: [name] MRN: [id number] DOB: 1950-12-09 Today's Date: 08/13/2013 Time: [id number] OT Time Calculation (min): 38 min  OT Assessment / Plan / Recommendation  History of present illness Pt s/p L BKA 9/21. Pt L LE NWB.   OT comments  Pt progressing slowly toward POC/goals. Requires maximum encouragement for participation and task initiation during ADL's while seated in chair. She should benefit from UE theraband exercises to assist with increasing I and strength for transfers.  Follow Up Recommendations  SNF    Barriers to Discharge       Equipment Recommendations  None recommended by OT    Recommendations for Other Services    Frequency Min 2X/week   Progress towards OT Goals Progress towards OT goals: Progressing toward goals (Added UE ther ex  goal today)  Plan Discharge plan remains appropriate    Precautions / Restrictions Precautions Precautions: Fall Restrictions Weight Bearing Restrictions: Yes LLE Weight Bearing: Non weight bearing Other Position/Activity Restrictions: LLE   Pertinent Vitals/Pain 7/10 catheter pain, RN made aware and pt was repositioned in chair.    ADL  Grooming: Performed;Wash/dry hands;Wash/dry face;Teeth care;Set up;Minimal assistance Where Assessed - Grooming: Supported sitting Upper Body Bathing: Performed;Chest;Left arm;Abdomen;Right arm;Minimal assistance Where Assessed - Upper Body Bathing: Supported sitting Lower Body Bathing: +2 Total assistance;Simulated (Peri care seated in chair) Where Assessed - Lower Body Bathing: Supported sitting Upper Body Dressing: Performed;Moderate assistance Where Assessed - Upper Body Dressing: Supported sitting Transfers/Ambulation Related to ADLs: Per pt, she just got in chair and is unable to perform functional mobility. PT states lateral lean & slide transfer from bed to chair w/ +2 total assist prior to OT arrival. ADL Comments: Pt performed ADL's  while sitting up in chair today. Pt requires increased encouragement to participate & for task initiation noted. Pt performed chair push ups x3 after max verbal education/demonstration. Discussed Role of OT w/ pt/daughter and discussed that pt should benefit from UE strengthening program to assist with increased participation in transfers.    OT Diagnosis:    OT Problem List:   OT Treatment Interventions:     OT Goals(current goals can now be found in the care plan section)    Visit Information  Last OT Received On: 08/13/13 Assistance Needed: +2 History of Present Illness: Pt s/p L BKA 9/21. Pt L LE NWB.    Subjective Data      Prior Functioning       Cognition  Cognition Arousal/Alertness: Lethargic Behavior During Therapy: Flat affect Overall Cognitive Status: Impaired/Different from baseline Area of Impairment: Following commands (Taks initiation) Following Commands: Follows one step commands inconsistently Problem Solving: Slow processing;Difficulty sequencing;Requires verbal cues;Requires tactile cues    Mobility       Exercises  Other Exercises Other Exercises: Pt was educated in and performed 3 reps of chair push ups to assist with increased independence with transfers. Max vc/tc's needed.   Balance     End of Session OT - End of Session Activity Tolerance: Patient limited by fatigue;Patient limited by lethargy;Patient limited by pain Patient left: in chair;with call bell/phone within reach;with nursing/sitter in room;with family/visitor present  GO     Josephine Igo Dixon 08/13/2013, 11:07 AM

## 2013-08-13 NOTE — Clinical Social Work Note (Signed)
Clinical Social Worker will transfer patient to 4E CSW who will follow for discharge planning needs. Plan is for patient to be transferred to [facility] when medically stable.   Leandro Reasoner MSW, [place]

## 2013-08-13 NOTE — Progress Notes (Signed)
Physical Therapy Treatment Patient Details Name: [name] MRN: [id number] DOB: 1951-08-07 Today's Date: 08/13/2013 Time: [id number] PT Time Calculation (min): 25 min  PT Assessment / Plan / Recommendation  History of Present Illness Pt s/p L BKA 9/21. Pt L LE NWB.   PT Comments   Pt more alert this session however limited due to pain.  Pt able to make great progress with bed mobility and able to tolerate lateral transfer to recliner.    Follow Up Recommendations  SNF     Equipment Recommendations  Wheelchair (measurements PT)    Frequency Min 3X/week   Progress towards PT Goals Progress towards PT goals: Progressing toward goals  Plan Current plan remains appropriate    Precautions / Restrictions Precautions Precautions: Fall Restrictions Weight Bearing Restrictions: Yes LLE Weight Bearing: Non weight bearing Other Position/Activity Restrictions: LLE   Pertinent Vitals/Pain 4/10 left LE    Mobility  Bed Mobility Bed Mobility: Supine to Sit;Sit to Supine Supine to Sit: 3: Mod assist Details for Bed Mobility Assistance: (A) to scoot hips to EOB and elevate trunk.  Pt needed extra time to complete task due to pain but able to assist.   Transfers Transfers: Lateral/Scoot Transfers Lateral/Scoot Transfers: 1: +2 Total assist;From elevated surface;With armrests removed (drop arm recliner) Lateral Transfers: Patient Percentage: 50% Details for Transfer Assistance: (A) with use of pad to complete scooting from bed to recliner with max cues for hand placement.  Ambulation/Gait Ambulation/Gait Assistance: Not tested (comment)    Exercises     PT Diagnosis:    PT Problem List:   PT Treatment Interventions:     PT Goals (current goals can now be found in the care plan section) Acute Rehab PT Goals PT Goal Formulation: With patient/family Time For Goal Achievement: 08/22/13 Potential to Achieve Goals: Fair  Visit Information  Last PT Received On: 08/13/13 Assistance  Needed: +2 History of Present Illness: Pt s/p L BKA 9/21. Pt L LE NWB.    Subjective Data      Cognition  Cognition Arousal/Alertness: Awake/alert Behavior During Therapy: Flat affect Overall Cognitive Status: Within Functional Limits for tasks assessed Area of Impairment: Following commands (Taks initiation) Following Commands: Follows one step commands inconsistently Problem Solving: Slow processing;Difficulty sequencing;Requires verbal cues;Requires tactile cues    Balance  Balance Balance Assessed: Yes Static Sitting Balance Static Sitting - Balance Support: Bilateral upper extremity supported Static Sitting - Level of Assistance: 5: Stand by assistance  End of Session PT - End of Session Equipment Utilized During Treatment: Gait belt Activity Tolerance: Patient limited by pain Patient left: in chair;with call bell/phone within reach Nurse Communication: Mobility status   GP     [name] 08/13/2013, 11:30 AM Antoine Poche, PT DPT [phone]

## 2013-08-13 NOTE — Progress Notes (Addendum)
CSW following for dc to [facility] when medically ready. CSW has completed FL2 & will continue to follow and assist with return.   Jeanette Caprice, MSW, [place]

## 2013-08-13 NOTE — Progress Notes (Signed)
Pt a/o, pt has c/o pain in Left leg s/p BKA, PRN oxycodone as ordered, pt oob with PT and back to bed via hoyer lift, tolerated well, Foley cath removed, voiding well, will continue to monitor

## 2013-08-13 NOTE — Progress Notes (Addendum)
Renal Service Daily Progress Note  [facility]  Subjective: UOP better 1525 yesterday, creat down 2.28 today (!)  Physical Exam:  Blood pressure 160/62, pulse 64, temperature 98.2 F (36.8 C), temperature source Oral, resp. rate 19, height 5\' 2"  (1.575 m), weight 105.144 kg (231 lb 12.8 oz), SpO2 100.00%. Gen: adult AAF, alert, no distress Neck: mild JVD Chest: faint crackles bilat bases CV: regular, 2/6 SEM, no rub or gallop  Abdomen: soft, obese, nontender, + BS, large pannus  Ext: mild diffuse nonpitting edema UE's primarily, L BKA wrapped Neuro: Ox3, no asterixis  UA- 0-2 wbc, rbc, 100 protein  Assessment/Plan:  1. Acute on CRF- after BKA surgery, unclear cause (AIN from abx, hemodynamic), much better, creat down below prior baseline. OK to remove HD cath and foley, have d/w primary , ok for d/c from my standpoint, she will f/u with Dr Moshe Cipro at [facility], will sign off. 2. Anemia- transfused 9/23, started aranesp and IV Fe 3. CKD stage IV, baseline creat 3.0 4. 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 DM, longstanding 6. CVA april 2014 7. Hx MI/CAD 8. Morbid obesity 9. HTN- holding coreg  Kelly Splinter  MD Pager [phone]    Cell  [phone] 08/13/2013, 11:27 AM       Recent Labs Lab 08/10/13 1312 08/11/13 0445 08/12/13 1130 08/13/13 0755  NA 128* 130* 135 137  K 4.7 5.0 3.9 3.6  CL 94* 96 101 103  CO2 18* 16* 23 24  GLUCOSE 273* 159* 272* 174*  BUN 77* 83* 51* 41*  CREATININE 5.60* 6.46* 3.21* 2.28*  CALCIUM 8.5 8.8 8.4 8.9  PHOS 7.7*  --  4.1 2.9    Recent Labs Lab 08/10/13 1312 08/12/13 1130 08/13/13 0755  ALBUMIN 1.9* 1.9* 1.9*    Recent Labs Lab 08/08/13 1405 08/09/13 0132  08/11/13 0445 08/12/13 0500 08/13/13 0755  WBC 9.4 10.2  < > 8.2 8.1 8.3  NEUTROABS 6.4 6.5  --   --   --   --   HGB 6.1* 6.8*  < > 6.4* 8.4* 8.9*  HCT 19.5* 21.0*  < > 20.3* 25.4* 27.2*  MCV 84.4 84.0  < > 86.0 83.3 84.7  PLT 164 199  < > 206 219 251  < > =  values in this interval not displayed. Marland Kitchen aspirin  325 mg Oral Q24H  . atorvastatin  40 mg Oral q1800  . carvedilol  12.5 mg Oral BID WC  . darbepoetin (ARANESP) injection - DIALYSIS  100 mcg Intravenous Q Tue-HD  . ferric gluconate (FERRLECIT/NULECIT) IV  125 mg Intravenous Daily  . gabapentin  100 mg Oral BID  . insulin aspart  0-9 Units Subcutaneous TID WC  . insulin glargine  15 Units Subcutaneous QHS  . senna  1 tablet Oral BID     acetaminophen, acetaminophen, oxyCODONE, pneumococcal 23 valent vaccine, polyethylene glycol, sorbitol

## 2013-08-13 NOTE — Progress Notes (Signed)
PATIENT DETAILS Name: [name] Age: 62 y.o. Sex: female Date of Birth: 1951-05-19 Admit Date: 08/06/2013 Admitting Physician Nita Sells, MD QO:[id number] L., MD  Subjective: No major complaints  Assessment/Plan: Active Problems: S/p Transmet Amputation on 9/19/L and BKA 9/21 for gangrenous foot -patient was admitted 08/06/13 with impending gangrene L foot, with WBC 12 and drainage, she Underwent L lis-franc amputation 9/19 am-she continued to have low-grade temperatures despite being on antibiotics and she ultimately underwent BKA on 08/09/13. -Pain management with oxycodone-increase to 10 mg, and start Neurontin -off antibiotics since 08/09/13  Acute on CRF -after BKA surgery, unclear cause (AIN from abx, hemodynamic), dialyzed 9/23 X1, UOP better now with creatinine downtrending -defer to nephrology  Metabolic acidosis -from above -resolved post HD  Anemia-multifactorial -2/2 to CKD + ABLA- Transfused x 4 this admit. -Continue Fe -on epo   DM -CBG's relatively stable -c/w Lantus-but increase to 15 units and SSI  Dyslipidemia -resume statins   H/o CAD/PAD/CVA -Asa 325 daily -resume statins -resume beta blockers-as BP trending up  HTN -Resume Coreg-but at 12.5mg  BID  Morbid Obesity -counseled   Disposition: Remain inpatient-SNF tomorrow-will let SW know  DVT Prophylaxis:  SCD's  Code Status: Full code   Family Communication Daughter at bedside  Procedures: - 9/19/L and BKA 9/21  CONSULTS:  nephrology and orthopedic surgery   MEDICATIONS: Scheduled Meds: . aspirin  325 mg Oral Q24H  . darbepoetin (ARANESP) injection - DIALYSIS  100 mcg Intravenous Q Tue-HD  . ferric gluconate (FERRLECIT/NULECIT) IV  125 mg Intravenous Daily  . insulin aspart  0-9 Units Subcutaneous TID WC  . insulin glargine  10 Units Subcutaneous QHS  . senna  1 tablet Oral BID   Continuous Infusions:  PRN Meds:.acetaminophen, acetaminophen, oxyCODONE,  pneumococcal 23 valent vaccine, polyethylene glycol, sorbitol  Antibiotics: Anti-infectives   Start     Dose/Rate Route Frequency Ordered Stop   08/09/13 1200  ceFAZolin (ANCEF) IVPB 2 g/50 mL premix     2 g 100 mL/hr over 30 Minutes Intravenous Every 6 hours 08/09/13 1122 08/09/13 2349   08/07/13 1200  piperacillin-tazobactam (ZOSYN) IVPB 2.25 g  Status:  Discontinued     2.25 g 100 mL/hr over 30 Minutes Intravenous 4 times per day 08/07/13 1132 08/09/13 1122   08/07/13 0600  ceFAZolin (ANCEF) IVPB 2 g/50 mL premix  Status:  Discontinued    Comments:  For pre-incisional abx.   2 g 100 mL/hr over 30 Minutes Intravenous 3 times per day 08/06/13 1439 08/06/13 1500   08/06/13 2100  piperacillin-tazobactam (ZOSYN) IVPB 3.375 g  Status:  Discontinued     3.375 g 12.5 mL/hr over 240 Minutes Intravenous Every 8 hours 08/06/13 1959 08/07/13 1132   08/06/13 1100  piperacillin-tazobactam (ZOSYN) IVPB 3.375 g     3.375 g 100 mL/hr over 30 Minutes Intravenous  Once 08/06/13 1046 08/06/13 1206   08/06/13 1030  vancomycin (VANCOCIN) 1,500 mg in sodium chloride 0.9 % 500 mL IVPB     1,500 mg 250 mL/hr over 120 Minutes Intravenous STAT 08/06/13 1000 08/06/13 1438   08/01/13 1200  vancomycin (VANCOCIN) 1,500 mg in sodium chloride 0.9 % 500 mL IVPB  Status:  Discontinued     1,500 mg 250 mL/hr over 120 Minutes Intravenous Every 48 hours 08/06/13 1345 08/09/13 1122       PHYSICAL EXAM: Vital signs in last 24 hours: Filed Vitals:   08/12/13 1526 08/12/13 1600 08/12/13 2033 08/13/13 0530  BP:  155/72 148/62 160/62  Pulse:  64 71 64  Temp: 98.5 F (36.9 C) 98.1 F (36.7 C) 97.9 F (36.6 C) 98.2 F (36.8 C)  TempSrc: Oral Oral Oral Oral  Resp:  20 20 19   Height:  5\' 2"  (1.575 m)    Weight:  106.7 kg (235 lb 3.7 oz)  105.144 kg (231 lb 12.8 oz)  SpO2:  98% 100% 100%    Weight change: -2.5 kg (-5 lb 8.2 oz) Filed Weights   08/11/13 1949 08/12/13 1600 08/13/13 0530  Weight: 106.7 kg (235 lb  3.7 oz) 106.7 kg (235 lb 3.7 oz) 105.144 kg (231 lb 12.8 oz)   Body mass index is 42.39 kg/(m^2).   Gen Exam: Awake and alert with clear speech.   Neck: Supple, No JVD.   Chest: B/L Clear.   CVS: S1 S2 Regular, no murmurs.  Abdomen: soft, BS +, non tender, non distended.  Extremities: no edema, left BKA Neurologic: Non Focal.   Skin: No Rash.   Wounds: N/A.   Intake/Output from previous day:  Intake/Output Summary (Last 24 hours) at 08/13/13 1038 Last data filed at 08/13/13 [id number]  Gross per 24 hour  Intake    240 ml  Output   2125 ml  Net  -1885 ml     LAB RESULTS: CBC  Recent Labs Lab 08/06/13 1115  08/08/13 1405 08/09/13 0132 08/10/13 0030 08/11/13 0445 08/12/13 0500 08/13/13 0755  WBC 12.2*  < > 9.4 10.2 9.0 8.2 8.1 8.3  HGB 7.9*  < > 6.1* 6.8* 6.7* 6.4* 8.4* 8.9*  HCT 24.4*  < > 19.5* 21.0* 20.7* 20.3* 25.4* 27.2*  PLT 210  < > 164 199 207 206 219 251  MCV 82.2  < > 84.4 84.0 85.2 86.0 83.3 84.7  MCH 26.6  < > 26.4 27.2 27.6 27.1 27.5 27.7  MCHC 32.4  < > 31.3 32.4 32.4 31.5 33.1 32.7  RDW 16.6*  < > 16.8* 16.7* 17.8* 18.1* 16.6* 16.4*  LYMPHSABS 2.1  --  1.8 2.3  --   --   --   --   MONOABS 1.2*  --  1.1* 1.1*  --   --   --   --   EOSABS 0.1  --  0.1 0.3  --   --   --   --   BASOSABS 0.0  --  0.0 0.0  --   --   --   --   < > = values in this interval not displayed.  Chemistries   Recent Labs Lab 08/10/13 0540 08/10/13 1312 08/11/13 0445 08/12/13 1130 08/13/13 0755  NA 129* 128* 130* 135 137  K 4.8 4.7 5.0 3.9 3.6  CL 96 94* 96 101 103  CO2 18* 18* 16* 23 24  GLUCOSE 239* 273* 159* 272* 174*  BUN 76* 77* 83* 51* 41*  CREATININE 5.23* 5.60* 6.46* 3.21* 2.28*  CALCIUM 8.6 8.5 8.8 8.4 8.9    CBG:  Recent Labs Lab 08/12/13 0820 08/12/13 1247 08/12/13 1604 08/12/13 2055 08/13/13 0551  GLUCAP 208* 237* 267* 203* 192*    GFR Estimated Creatinine Clearance: 29.1 ml/min (by C-G formula based on Cr of 2.28).  Coagulation profile No  results found for this basename: INR, PROTIME,  in the last 168 hours  Cardiac Enzymes No results found for this basename: CK, CKMB, TROPONINI, MYOGLOBIN,  in the last 168 hours  No components found with this basename: POCBNP,  No results found for this basename: DDIMER,  in the last 72 hours  No results found for this basename: HGBA1C,  in the last 72 hours No results found for this basename: CHOL, HDL, LDLCALC, TRIG, CHOLHDL, LDLDIRECT,  in the last 72 hours No results found for this basename: TSH, T4TOTAL, FREET3, T3FREE, THYROIDAB,  in the last 72 hours No results found for this basename: VITAMINB12, FOLATE, FERRITIN, TIBC, IRON, RETICCTPCT,  in the last 72 hours No results found for this basename: LIPASE, AMYLASE,  in the last 72 hours  Urine Studies No results found for this basename: UACOL, UAPR, USPG, UPH, UTP, UGL, UKET, UBIL, UHGB, UNIT, UROB, ULEU, UEPI, UWBC, URBC, UBAC, CAST, CRYS, UCOM, BILUA,  in the last 72 hours  MICROBIOLOGY: Recent Results (from the past 240 hour(s))  CULTURE, BLOOD (ROUTINE X 2)     Status: None   Collection Time    08/06/13 11:15 AM      Result Value Range Status   Specimen Description BLOOD LEFT ANTECUBITAL   Final   Special Requests BOTTLES DRAWN AEROBIC AND ANAEROBIC 5CC   Final   Culture  Setup Time     Final   Value: 08/06/2013 14:49     Performed at [facility]   Culture     Final   Value: NO GROWTH 5 DAYS     Performed at [facility]   Report Status 08/12/2013 FINAL   Final  CULTURE, BLOOD (ROUTINE X 2)     Status: None   Collection Time    08/06/13 11:40 AM      Result Value Range Status   Specimen Description BLOOD RIGHT ANTECUBITAL   Final   Special Requests BOTTLES DRAWN AEROBIC AND ANAEROBIC 6ML   Final   Culture  Setup Time     Final   Value: 08/06/2013 14:49     Performed at [facility]   Culture     Final   Value: NO GROWTH 5 DAYS     Performed at [facility]   Report Status 08/12/2013  FINAL   Final  MRSA PCR SCREENING     Status: None   Collection Time    08/07/13  6:30 AM      Result Value Range Status   MRSA by PCR NEGATIVE  NEGATIVE Final   Comment:            The GeneXpert MRSA Assay (FDA     approved for NASAL specimens     only), is one component of a     comprehensive MRSA colonization     surveillance program. It is not     intended to diagnose MRSA     infection nor to guide or     monitor treatment for     MRSA infections.    RADIOLOGY STUDIES/RESULTS: Ir Fluoro Guide Cv Line Right  08/11/2013   *RADIOLOGY REPORT*  Indication: Worsening renal function, in need of intravenous access for initiation of dialysis  NON-TUNNELED CENTRAL VENOUS HEMODIALYSIS CATHETER PLACEMENT WITH ULTRASOUND AND FLUOROSCOPIC GUIDANCE  Comparisons: Ultrasound and fluoroscopic guided temporary dialysis catheter placement - 06/04/2013  Intravenous Medications: None  Contrast: None  Fluoroscopy Time: 12 seconds  Complications: None immediate  Findings / Procedure:  Informed written consent was obtained from the patient after a discussion of the risks, benefits, and alternatives to treatment. Questions regarding the procedure were encouraged and answered. The right neck and chest were prepped with chlorhexidine in a sterile fashion, and a sterile drape was applied covering the operative field.  Maximum barrier  sterile technique with sterile gowns and gloves were used for the procedure.  A timeout was performed prior to the initiation of the procedure.  After creating a small venotomy incision, a micropuncture kit was utilized to access the right internal jugular vein under direct, real-time ultrasound guidance after the overlying soft tissues were anesthetized with 1% lidocaine with epinephrine.  Ultrasound image documentation was performed.   The microwire was kinked to measure appropriate catheter length.  A stiff glidewire was advanced to the level of the IVC.  Under fluoroscopic guidance, the  venotomy was serially dilated, ultimately allowing placement of a 20 cm temporary Trialysis catheter with tip ultimately terminating within the superior aspect of the right atrium.  Final catheter positioning was confirmed and documented with a spot radiographic image.  The catheter aspirates and flushes normally.  The catheter was flushed with appropriate volume heparin dwells.  The catheter exit site was secured with a 0-Prolene retention suture.  A dressing was placed.  The patient tolerated the procedure well without immediate post procedural complication.  IMPRESSION:  Successful placement of 20 cm temporary dialysis catheter with tip terminating within the superior aspect of the right atrium.  This catheter may be converted to a tunneled dialysis catheter at a later date as indicated.  The catheter is ready for immediate use.   Original Report Authenticated By: Jake Seats, MD   Ir US Guide Vasc Access Right  08/11/2013   *RADIOLOGY REPORT*  Indication: Worsening renal function, in need of intravenous access for initiation of dialysis  NON-TUNNELED CENTRAL VENOUS HEMODIALYSIS CATHETER PLACEMENT WITH ULTRASOUND AND FLUOROSCOPIC GUIDANCE  Comparisons: Ultrasound and fluoroscopic guided temporary dialysis catheter placement - 06/04/2013  Intravenous Medications: None  Contrast: None  Fluoroscopy Time: 12 seconds  Complications: None immediate  Findings / Procedure:  Informed written consent was obtained from the patient after a discussion of the risks, benefits, and alternatives to treatment. Questions regarding the procedure were encouraged and answered. The right neck and chest were prepped with chlorhexidine in a sterile fashion, and a sterile drape was applied covering the operative field.  Maximum barrier sterile technique with sterile gowns and gloves were used for the procedure.  A timeout was performed prior to the initiation of the procedure.  After creating a small venotomy incision, a micropuncture  kit was utilized to access the right internal jugular vein under direct, real-time ultrasound guidance after the overlying soft tissues were anesthetized with 1% lidocaine with epinephrine.  Ultrasound image documentation was performed.   The microwire was kinked to measure appropriate catheter length.  A stiff glidewire was advanced to the level of the IVC.  Under fluoroscopic guidance, the venotomy was serially dilated, ultimately allowing placement of a 20 cm temporary Trialysis catheter with tip ultimately terminating within the superior aspect of the right atrium.  Final catheter positioning was confirmed and documented with a spot radiographic image.  The catheter aspirates and flushes normally.  The catheter was flushed with appropriate volume heparin dwells.  The catheter exit site was secured with a 0-Prolene retention suture.  A dressing was placed.  The patient tolerated the procedure well without immediate post procedural complication.  IMPRESSION:  Successful placement of 20 cm temporary dialysis catheter with tip terminating within the superior aspect of the right atrium.  This catheter may be converted to a tunneled dialysis catheter at a later date as indicated.  The catheter is ready for immediate use.   Original Report Authenticated By: Jake Seats, MD  Dg Chest Port 1 View  08/11/2013   CLINICAL DATA:  Line placement.  EXAM: PORTABLE CHEST - 1 VIEW  COMPARISON:  08/10/2013  FINDINGS: The heart is enlarged. Shallow lung inflation. No pneumothorax. No central lines are identified despite the history. Support devices overlie the patient. At the right lung base, markings appear more chronic, likely related to shallow inflation. However, findings could be related to increased atelectasis or early developing infiltrate.  IMPRESSION: 1. No evidence for central line despite the history. 2. Cardiomegaly. 3. Shallow lung inflation. 4. Right lung base crowded markings versus atelectasis/infiltrate.   These results will be called to the ordering clinician or representative by the technologist, and communication documented in the PACS Dashboard.   Electronically Signed   By: Shon Hale M.D.   On: 08/11/2013 14:06   Dg Chest Port 1 View  08/10/2013   CLINICAL DATA:  Volume overload. Chronic kidney disease. Question pulmonary edema.  EXAM: PORTABLE CHEST - 1 VIEW  COMPARISON:  03/18/2013.  FINDINGS: 1857 hr. There is stable cardiomegaly and chronic vascular congestion. No overt pulmonary edema, confluent airspace opacity or significant pleural effusion is identified.  IMPRESSION: Stable cardiomegaly and chronic vascular congestion. No overt pulmonary edema identified.   Electronically Signed   By: Camie Patience   On: 08/10/2013 19:14   Dg Foot Complete Left  08/06/2013   CLINICAL DATA:  Blister on the 2nd toe, worsening.  EXAM: LEFT FOOT - COMPLETE 3+ VIEW  COMPARISON:  Plain films 05/1913 and 05/30/2013.  FINDINGS: The patient's claw second toe is in flexion somewhat limiting evaluation. No bony destructive change or soft tissue gas collection is identified. No radiopaque foreign body is seen. There is no fracture or dislocation. Extensive atherosclerosis is noted. Large calcaneal spur and midfoot degenerative change are seen.  IMPRESSION: Negative for plain film evidence of osteomyelitis. No acute finding.   Electronically Signed   By: Inge Rise M.D.   On: 08/06/2013 10:56    Oren Binet, MD  [facility] Pager:[phone]  If 7PM-7AM, please contact night-coverage [email or web address] Password [facility] 08/13/2013, 10:38 AM   LOS: 7 days

## 2013-08-14 LAB — RENAL FUNCTION PANEL
Albumin: 2 g/dL — ABNORMAL LOW (ref 3.5–5.2)
BUN: 31 mg/dL — ABNORMAL HIGH (ref 6–23)
Chloride: 103 mEq/L (ref 96–112)
Creatinine, Ser: 1.72 mg/dL — ABNORMAL HIGH (ref 0.50–1.10)
GFR calc non Af Amer: 31 mL/min — ABNORMAL LOW (ref >90)
Phosphorus: 2.6 mg/dL (ref 2.3–4.6)
Potassium: 3.7 mEq/L (ref 3.5–5.1)

## 2013-08-14 LAB — GLUCOSE, CAPILLARY: Glucose-Capillary: 201 mg/dL — ABNORMAL HIGH (ref 70–99)

## 2013-08-14 MED ORDER — POLYETHYLENE GLYCOL 3350 17 G PO PACK: 17.0000 g | PACK | Freq: Every day | ORAL | Status: DC | PRN

## 2013-08-14 MED ORDER — FUROSEMIDE 40 MG PO TABS: 40.0000 mg | ORAL_TABLET | Freq: Every day | ORAL | Status: DC

## 2013-08-14 MED ORDER — INSULIN GLARGINE 100 UNIT/ML SOLOSTAR PEN: 15.0000 [IU] | PEN_INJECTOR | Freq: Every day | SUBCUTANEOUS | Status: DC

## 2013-08-14 MED ORDER — GABAPENTIN 100 MG PO CAPS: 100.0000 mg | ORAL_CAPSULE | Freq: Two times a day (BID) | ORAL | Status: DC

## 2013-08-14 MED ORDER — OXYCODONE HCL 5 MG PO TABS: 5.0000 mg | ORAL_TABLET | Freq: Four times a day (QID) | ORAL | Status: DC | PRN

## 2013-08-14 NOTE — Progress Notes (Signed)
Pt a/o, c/o pain in left left s/p bka, PRN oxycodone given as ordered, pt being dc to [facility] for rehab, report called, pt leaving via ambulance, pt stable, family at bedside

## 2013-08-14 NOTE — Progress Notes (Signed)
Clinical Social Work Department CLINICAL SOCIAL WORK PLACEMENT NOTE 08/14/2013  Patient:  Saur,Le  Account Number:  [id number] Admit date:  08/06/2013  Clinical Social Worker:  Levie Heritage  Date/time:  08/08/2013 04:10 PM  Clinical Social Work is seeking post-discharge placement for this patient at the following level of care:   [place]   (*CSW will update this form in Epic as items are completed)   08/08/2013  Patient/family provided with [facility] Department of Clinical Social Work's list of facilities offering this level of care within the geographic area requested by the patient (or if unable, by the patient's family).  08/08/2013  Patient/family informed of their freedom to choose among providers that offer the needed level of care, that participate in Medicare, Medicaid or managed care program needed by the patient, have an available bed and are willing to accept the patient.  08/08/2013  Patient/family informed of [facility]' ownership interest in [facility], as well as of the fact that they are under no obligation to receive care at this facility.  PASARR submitted to EDS on 03/30/2013 PASARR number received from EDS on 03/30/2013  FL2 transmitted to all facilities in geographic area requested by pt/family on  08/08/2013 FL2 transmitted to all facilities within larger geographic area on   Patient informed that his/her managed care company has contracts with or will negotiate with  certain facilities, including the following:     Patient/family informed of bed offers received:  08/12/2013 Patient chooses bed at [place] Physician recommends and patient chooses bed at    Patient to be transferred to [facility] on  08/14/2013 Patient to be transferred to facility by EMS  The following physician request were entered in Epic:   Additional Comments:  Jeanette Caprice, MSW,  [place]

## 2013-08-14 NOTE — Progress Notes (Signed)
Clinical Social Worker facilitated patient discharge by contacting the patient, family and facility, [facility]. Patient agreeable to this plan and arranging transport via EMS . CSW will sign off, as social work intervention is no longer needed.  [name], MSW, LCSWA [phone]  

## 2013-08-14 NOTE — Discharge Summary (Signed)
PATIENT DETAILS Name: [name] Age: 62 y.o. Sex: female Date of Birth: [birth year] MRN: [id number]. Admit Date: 08/06/2013 Admitting Physician: Nita Sells, MD [id number] L., MD  Recommendations for Outpatient Follow-up:  1. Please continue to monitor renal function. Renal failure slowly resolving. 2. Needs followup with orthopedics-please see below 3. Monitor CBC closely-has anemia of chronic disease. He required multiple units of PRBC transfusion this admission.  PRIMARY DISCHARGE DIAGNOSIS:  Active Problems:   Anemia of chronic disease   Type II or unspecified type diabetes mellitus with renal manifestations, uncontrolled(250.42)   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   Foot infection   Hyponatremia   Chronic kidney disease (CKD), stage IV (severe)      PAST MEDICAL HISTORY: Past Medical History  Diagnosis Date  . ST elevation myocardial infarction (STEMI) of inferior wall  03/2009    Ostial/proximal RCA occlusion treated with BMS stent  . CAD (coronary artery disease), native coronary artery      status post PCI to the RCA for inferior STEMI  . Cancer     endo ca  . Hypertension associated with diabetes   . Hyperlipidemia LDL goal <70   . Diabetes mellitus, type II, insulin dependent     With complications - CAD, CVA, peripheral ulcer ,  . Stroke/cerebrovascular accident  4/30/ 2014    Right-sided weakness, mostly with balance issues and only mild weakness.  . Diabetic peripheral neuropathy associated with type 2 diabetes mellitus   . Obesity, Class III, BMI 40-49.9 (morbid obesity)     BMI 43; 5' 2',  235 pounds 6.4 ounces  . CKD (chronic kidney disease), stage IV     Recent acute on chronic exacerbation in July 2014  . PAD (peripheral artery disease)   . Diabetic foot ulcer   . Dry gangrene     Left second toe; now on Sole of L foot    DISCHARGE MEDICATIONS:   Medication List         aspirin 325 MG tablet  Take 325 mg by mouth daily.  Take 30 minutes after taking  500 cr niaspan     atorvastatin 40 MG tablet  Commonly known as:  LIPITOR  Take 1 tablet (40 mg total) by mouth daily at 6 PM.     carvedilol 25 MG tablet  Commonly known as:  COREG  Take 25 mg by mouth 2 (two) times daily with a meal.     ferrous sulfate 325 (65 FE) MG tablet  Take 325 mg by mouth daily with breakfast.     furosemide 40 MG tablet  Commonly known as:  LASIX  Take 1 tablet (40 mg total) by mouth daily.     gabapentin 100 MG capsule  Commonly known as:  NEURONTIN  Take 1 capsule (100 mg total) by mouth 2 (two) times daily.     insulin aspart 100 UNIT/ML injection  Commonly known as:  novoLOG  Inject 0-15 Units into the skin 3 (three) times daily with meals. Sliding scale insulin CBG 70 - 120: 0 units: CBG 121 - 150: 2 units; CBG 151 - 200: 3 units; CBG 201 - 250: 5 units; CBG 251 - 300: 8 units;CBG 301 - 350: 11 units; CBG 351 - 400: 15 units; CBG > 400 : 15 units and notify your doctor     Insulin Glargine 100 UNIT/ML Sopn  Commonly known as:  LANTUS SOLOSTAR  Inject 15 Units into the skin at bedtime.  niacin 500 MG CR tablet  Commonly known as:  NIASPAN  500 mg. Take 1 tablet (500 mg total) by mouth at bedtime. Take with snack.  30 minutes prior to taking medication take aspirin 325 mg daily.     omega-3 acid ethyl esters 1 G capsule  Commonly known as:  LOVAZA  Take 1 g by mouth 3 (three) times daily.     oxyCODONE 5 MG immediate release tablet  Commonly known as:  Oxy IR/ROXICODONE  Take 1-2 tablets (5-10 mg total) by mouth every 6 (six) hours as needed.     polyethylene glycol packet  Commonly known as:  MIRALAX / GLYCOLAX  Take 17 g by mouth daily as needed.        ALLERGIES:  No Known Allergies  BRIEF HPI:  See H&P, Labs, Consult and Test reports for all details in brief, 62 y/o ?, Known recent R PICA infarct 07/23/13, STEMI 03/2009, CAD DM ty 2 with PAD, Prior CVA 4/14, Morbid obesity, Body mass index is 39.88  kg/(m^2)., CKD 4-5 admitted 08/06/13 with impending gangrene L foot, with WBC 12 and drainage Underwent L lis-franc amputation 9/19 am-she continues to have low-grade temperatures despite being on antibiotics .She ultimately underwent BKA on 08/09/13.  CONSULTATIONS:   orthopedic surgery  nephrology  PERTINENT RADIOLOGIC STUDIES: Ir Fluoro Guide Cv Line Right  08/11/2013   *RADIOLOGY REPORT*  Indication: Worsening renal function, in need of intravenous access for initiation of dialysis  NON-TUNNELED CENTRAL VENOUS HEMODIALYSIS CATHETER PLACEMENT WITH ULTRASOUND AND FLUOROSCOPIC GUIDANCE  Comparisons: Ultrasound and fluoroscopic guided temporary dialysis catheter placement - 06/04/2013  Intravenous Medications: None  Contrast: None  Fluoroscopy Time: 12 seconds  Complications: None immediate  Findings / Procedure:  Informed written consent was obtained from the patient after a discussion of the risks, benefits, and alternatives to treatment. Questions regarding the procedure were encouraged and answered. The right neck and chest were prepped with chlorhexidine in a sterile fashion, and a sterile drape was applied covering the operative field.  Maximum barrier sterile technique with sterile gowns and gloves were used for the procedure.  A timeout was performed prior to the initiation of the procedure.  After creating a small venotomy incision, a micropuncture kit was utilized to access the right internal jugular vein under direct, real-time ultrasound guidance after the overlying soft tissues were anesthetized with 1% lidocaine with epinephrine.  Ultrasound image documentation was performed.   The microwire was kinked to measure appropriate catheter length.  A stiff glidewire was advanced to the level of the IVC.  Under fluoroscopic guidance, the venotomy was serially dilated, ultimately allowing placement of a 20 cm temporary Trialysis catheter with tip ultimately terminating within the superior aspect of the  right atrium.  Final catheter positioning was confirmed and documented with a spot radiographic image.  The catheter aspirates and flushes normally.  The catheter was flushed with appropriate volume heparin dwells.  The catheter exit site was secured with a 0-Prolene retention suture.  A dressing was placed.  The patient tolerated the procedure well without immediate post procedural complication.  IMPRESSION:  Successful placement of 20 cm temporary dialysis catheter with tip terminating within the superior aspect of the right atrium.  This catheter may be converted to a tunneled dialysis catheter at a later date as indicated.  The catheter is ready for immediate use.   Original Report Authenticated By: Jake Seats, MD   Ir US Guide Vasc Access Right  08/11/2013   *RADIOLOGY  REPORT*  Indication: Worsening renal function, in need of intravenous access for initiation of dialysis  NON-TUNNELED CENTRAL VENOUS HEMODIALYSIS CATHETER PLACEMENT WITH ULTRASOUND AND FLUOROSCOPIC GUIDANCE  Comparisons: Ultrasound and fluoroscopic guided temporary dialysis catheter placement - 06/04/2013  Intravenous Medications: None  Contrast: None  Fluoroscopy Time: 12 seconds  Complications: None immediate  Findings / Procedure:  Informed written consent was obtained from the patient after a discussion of the risks, benefits, and alternatives to treatment. Questions regarding the procedure were encouraged and answered. The right neck and chest were prepped with chlorhexidine in a sterile fashion, and a sterile drape was applied covering the operative field.  Maximum barrier sterile technique with sterile gowns and gloves were used for the procedure.  A timeout was performed prior to the initiation of the procedure.  After creating a small venotomy incision, a micropuncture kit was utilized to access the right internal jugular vein under direct, real-time ultrasound guidance after the overlying soft tissues were anesthetized with 1%  lidocaine with epinephrine.  Ultrasound image documentation was performed.   The microwire was kinked to measure appropriate catheter length.  A stiff glidewire was advanced to the level of the IVC.  Under fluoroscopic guidance, the venotomy was serially dilated, ultimately allowing placement of a 20 cm temporary Trialysis catheter with tip ultimately terminating within the superior aspect of the right atrium.  Final catheter positioning was confirmed and documented with a spot radiographic image.  The catheter aspirates and flushes normally.  The catheter was flushed with appropriate volume heparin dwells.  The catheter exit site was secured with a 0-Prolene retention suture.  A dressing was placed.  The patient tolerated the procedure well without immediate post procedural complication.  IMPRESSION:  Successful placement of 20 cm temporary dialysis catheter with tip terminating within the superior aspect of the right atrium.  This catheter may be converted to a tunneled dialysis catheter at a later date as indicated.  The catheter is ready for immediate use.   Original Report Authenticated By: Jake Seats, MD   Dg Chest Port 1 View  08/11/2013   CLINICAL DATA:  Line placement.  EXAM: PORTABLE CHEST - 1 VIEW  COMPARISON:  08/10/2013  FINDINGS: The heart is enlarged. Shallow lung inflation. No pneumothorax. No central lines are identified despite the history. Support devices overlie the patient. At the right lung base, markings appear more chronic, likely related to shallow inflation. However, findings could be related to increased atelectasis or early developing infiltrate.  IMPRESSION: 1. No evidence for central line despite the history. 2. Cardiomegaly. 3. Shallow lung inflation. 4. Right lung base crowded markings versus atelectasis/infiltrate.  These results will be called to the ordering clinician or representative by the technologist, and communication documented in the PACS Dashboard.   Electronically  Signed   By: Shon Hale M.D.   On: 08/11/2013 14:06   Dg Chest Port 1 View  08/10/2013   CLINICAL DATA:  Volume overload. Chronic kidney disease. Question pulmonary edema.  EXAM: PORTABLE CHEST - 1 VIEW  COMPARISON:  03/18/2013.  FINDINGS: 1857 hr. There is stable cardiomegaly and chronic vascular congestion. No overt pulmonary edema, confluent airspace opacity or significant pleural effusion is identified.  IMPRESSION: Stable cardiomegaly and chronic vascular congestion. No overt pulmonary edema identified.   Electronically Signed   By: Camie Patience   On: 08/10/2013 19:14   Dg Foot Complete Left  08/06/2013   CLINICAL DATA:  Blister on the 2nd toe, worsening.  EXAM: LEFT FOOT -  COMPLETE 3+ VIEW  COMPARISON:  Plain films 05/1913 and 05/30/2013.  FINDINGS: The patient's claw second toe is in flexion somewhat limiting evaluation. No bony destructive change or soft tissue gas collection is identified. No radiopaque foreign body is seen. There is no fracture or dislocation. Extensive atherosclerosis is noted. Large calcaneal spur and midfoot degenerative change are seen.  IMPRESSION: Negative for plain film evidence of osteomyelitis. No acute finding.   Electronically Signed   By: Inge Rise M.D.   On: 08/06/2013 10:56   PERTINENT LAB RESULTS: CBC:  Recent Labs  08/12/13 0500 08/13/13 0755  WBC 8.1 8.3  HGB 8.4* 8.9*  HCT 25.4* 27.2*  PLT 219 251   CMET CMP     Component Value Date/Time   NA 138 08/14/2013 0640   K 3.7 08/14/2013 0640   CL 103 08/14/2013 0640   CO2 25 08/14/2013 0640   GLUCOSE 192* 08/14/2013 0640   BUN 31* 08/14/2013 0640   CREATININE 1.72* 08/14/2013 0640   CALCIUM 9.0 08/14/2013 0640   PROT 8.1 08/06/2013 1115   ALBUMIN 2.0* 08/14/2013 0640   AST 10 08/06/2013 1115   ALT 7 08/06/2013 1115   ALKPHOS 125* 08/06/2013 1115   BILITOT 0.4 08/06/2013 1115   GFRNONAA 31* 08/14/2013 0640   GFRAA 36* 08/14/2013 0640    GFR Estimated Creatinine Clearance: 38 ml/min (by C-G  formula based on Cr of 1.72). No results found for this basename: LIPASE, AMYLASE,  in the last 72 hours No results found for this basename: CKTOTAL, CKMB, CKMBINDEX, TROPONINI,  in the last 72 hours No components found with this basename: POCBNP,  No results found for this basename: DDIMER,  in the last 72 hours No results found for this basename: HGBA1C,  in the last 72 hours No results found for this basename: CHOL, HDL, LDLCALC, TRIG, CHOLHDL, LDLDIRECT,  in the last 72 hours No results found for this basename: TSH, T4TOTAL, FREET3, T3FREE, THYROIDAB,  in the last 72 hours No results found for this basename: VITAMINB12, FOLATE, FERRITIN, TIBC, IRON, RETICCTPCT,  in the last 72 hours Coags: No results found for this basename: PT, INR,  in the last 72 hours Microbiology: Recent Results (from the past 240 hour(s))  CULTURE, BLOOD (ROUTINE X 2)     Status: None   Collection Time    08/06/13 11:15 AM      Result Value Range Status   Specimen Description BLOOD LEFT ANTECUBITAL   Final   Special Requests BOTTLES DRAWN AEROBIC AND ANAEROBIC 5CC   Final   Culture  Setup Time     Final   Value: 08/06/2013 14:49     Performed at [facility]   Culture     Final   Value: NO GROWTH 5 DAYS     Performed at [facility]   Report Status 08/12/2013 FINAL   Final  CULTURE, BLOOD (ROUTINE X 2)     Status: None   Collection Time    08/06/13 11:40 AM      Result Value Range Status   Specimen Description BLOOD RIGHT ANTECUBITAL   Final   Special Requests BOTTLES DRAWN AEROBIC AND ANAEROBIC 6ML   Final   Culture  Setup Time     Final   Value: 08/06/2013 14:49     Performed at [facility]   Culture     Final   Value: NO GROWTH 5 DAYS     Performed at [facility]  Report Status 08/12/2013 FINAL   Final  MRSA PCR SCREENING     Status: None   Collection Time    08/07/13  6:30 AM      Result Value Range Status   MRSA by PCR NEGATIVE  NEGATIVE Final   Comment:             The GeneXpert MRSA Assay (FDA     approved for NASAL specimens     only), is one component of a     comprehensive MRSA colonization     surveillance program. It is not     intended to diagnose MRSA     infection nor to guide or     monitor treatment for     MRSA infections.     BRIEF HOSPITAL COURSE:  S/p Transmet Amputation on 9/19/L and BKA 9/21 for gangrenous foot  -patient was admitted 08/06/13 with impending gangrene L foot, with WBC 12 and drainage, she Underwent transmetatarsal amputation 9/19 am-she continued to have low-grade temperatures despite being on antibiotics and she ultimately underwent BKA on 08/09/13.  -Pain management with oxycodone-increase to 10 mg, and Neurontin, please titrate as necessary -off antibiotics since 08/09/13  - At this time considered stable to be transferred to skilled nursing facility, needs followup with orthopedics within 2 weeks.  Acute on CRF  -after BKA surgery, unclear cause (AIN from abx, hemodynamic), dialyzed 9/23 X1, UOP better now with creatinine downtrending  - Creatinine has significantly improved, by day of discharge it had decreased to 1.72. - Will need very close monitoring of electrolytes and further nephrology as needed   Metabolic acidosis  -from above  -resolved post HD   Anemia-multifactorial  -2/2 to CKD + ABLA- Transfused x 4 this admit.  -Continue Fe  - Was on epo  this admit, discontinue on admission and continue iron supplementation   DM  -CBG's relatively stable  -c/w Lantus-but increase to 15 units and SSI on discharge  Dyslipidemia  -resume statins on discharge   H/o CAD/PAD/CVA  - continued aspirin, statins and beta blockers   HTN  -Resume Coreg-but at 12.5mg  BID  - Resume Lasix but at 40 mg, and discussed with nephrology-Dr. Jonnie Finner  Morbid Obesity  -counseled   TODAY-DAY OF DISCHARGE:  Subjective:   [name] today has no headache,no chest abdominal pain,no new weakness tingling or  numbness, feels much better wants to go home today.   Objective:   Blood pressure 159/69, pulse 64, temperature 98.8 F (37.1 C), temperature source Oral, resp. rate 18, height 5\' 2"  (1.575 m), weight 102.4 kg (225 lb 12 oz), SpO2 97.00%.  Intake/Output Summary (Last 24 hours) at 08/14/13 1030 Last data filed at 08/14/13 0824  Gross per 24 hour  Intake    120 ml  Output   1300 ml  Net  -1180 ml   Filed Weights   08/12/13 1600 08/13/13 0530 08/14/13 0519  Weight: 106.7 kg (235 lb 3.7 oz) 105.144 kg (231 lb 12.8 oz) 102.4 kg (225 lb 12 oz)    Exam Awake Alert, Oriented *3, No new F.N deficits, Normal affect [place].AT,PERRAL Supple Neck,No JVD, No cervical lymphadenopathy appriciated.  Symmetrical Chest wall movement, Good air movement bilaterally, CTAB RRR,No Gallops,Rubs or new Murmurs, No Parasternal Heave +ve B.Sounds, Abd Soft, Non tender, No organomegaly appriciated, No rebound -guarding or rigidity. No Cyanosis, Clubbing or edema, No new Rash or bruise  DISCHARGE CONDITION: Stable  DISPOSITION: SNF  DISCHARGE INSTRUCTIONS:    Activity:  As  tolerated with Full fall precautions use walker/cane & assistance as needed  Diet recommendation: Diabetic Diet Heart Healthy diet   Discharge Orders   Future Appointments Provider Department Dept Phone   10/13/2013 10:00 AM Lorretta Harp, MD [facility] [phone]   10/22/2013 10:30 AM Philmore Pali, NP [facility] [phone]   Future Orders Complete By Expires   Diet - low sodium heart healthy  As directed    Diet Carb Modified  As directed    Increase activity slowly  As directed       Follow-up Information   Follow up with Marianna Payment, MD In 2 weeks.   Specialty:  Orthopedic Surgery   Contact information:   [place] [phone] [phone]         Total Time spent on discharge equals 45 minutes.  SignedOren Binet 08/14/2013 10:30  AM

## 2013-08-18 ENCOUNTER — Other Ambulatory Visit: Payer: Self-pay | Admitting: *Deleted

## 2013-08-18 MED ORDER — OXYCODONE HCL 5 MG PO TABS: 5.0000 mg | ORAL_TABLET | Freq: Four times a day (QID) | ORAL | Status: DC | PRN

## 2013-08-25 ENCOUNTER — Non-Acute Institutional Stay (SKILLED_NURSING_FACILITY): Payer: Medicare Other | Admitting: Internal Medicine

## 2013-08-25 DIAGNOSIS — I2510 Atherosclerotic heart disease of native coronary artery without angina pectoris: Secondary | ICD-10-CM

## 2013-08-25 DIAGNOSIS — I150 Renovascular hypertension: Secondary | ICD-10-CM

## 2013-08-25 DIAGNOSIS — D62 Acute posthemorrhagic anemia: Secondary | ICD-10-CM

## 2013-08-25 DIAGNOSIS — I96 Gangrene, not elsewhere classified: Secondary | ICD-10-CM

## 2013-09-01 ENCOUNTER — Non-Acute Institutional Stay (SKILLED_NURSING_FACILITY): Payer: Medicare Other | Admitting: Internal Medicine

## 2013-09-01 DIAGNOSIS — N184 Chronic kidney disease, stage 4 (severe): Secondary | ICD-10-CM

## 2013-09-01 DIAGNOSIS — D631 Anemia in chronic kidney disease: Secondary | ICD-10-CM

## 2013-09-21 DIAGNOSIS — D62 Acute posthemorrhagic anemia: Secondary | ICD-10-CM | POA: Insufficient documentation

## 2013-09-21 DIAGNOSIS — I96 Gangrene, not elsewhere classified: Secondary | ICD-10-CM | POA: Insufficient documentation

## 2013-09-21 NOTE — Progress Notes (Signed)
Patient ID: [name], female   DOB: 10-24-1951, 62 y.o.   MRN: [id number]        HISTORY & PHYSICAL  DATE: 08/25/2013   FACILITY: [facility]  LEVEL OF CARE: SNF (31)  ALLERGIES:  No Known Allergies  CHIEF COMPLAINT:  Manage acute blood loss anemia, left foot gangrene, and coronary artery disease.    HISTORY OF PRESENT ILLNESS:  The patient is a 62 year-old, African-American female who was hospitalized for left gangrenous foot.     LEFT FOOT GANGRENE:  Patient was hospitalized and underwent transmetatarsal amputation and left BKA.  She tolerated the procedure well and was admitted to this facility for short-term rehabilitation.  Patient denies pain in the left stump.    ANEMIA: Patient's hemoglobin dropped and required transfusion of 4 U of packed red blood cells in the hospital.    The anemia has been stable. The patient denies fatigue, melena or hematochezia. No complications from the medications currently being used.  Last hemoglobins are:  8.9 and 8.4.     CAD: Status post PCI to the right coronary artery.  The angina has been stable. The patient denies dyspnea on exertion, orthopnea, pedal edema, palpitations and paroxysmal nocturnal dyspnea. No complications noted from the medication presently being used.       PAST MEDICAL HISTORY :  Past Medical History  Diagnosis Date  . ST elevation myocardial infarction (STEMI) of inferior wall  03/2009    Ostial/proximal RCA occlusion treated with BMS stent  . CAD (coronary artery disease), native coronary artery      status post PCI to the RCA for inferior STEMI  . Cancer     endo ca  . Hypertension associated with diabetes   . Hyperlipidemia LDL goal <70   . Diabetes mellitus, type II, insulin dependent     With complications - CAD, CVA, peripheral ulcer ,  . Stroke/cerebrovascular accident  4/30/ 2014    Right-sided weakness, mostly with balance issues and only mild weakness.  . Diabetic peripheral  neuropathy associated with type 2 diabetes mellitus   . Obesity, Class III, BMI 40-49.9 (morbid obesity)     BMI 43; 5' 2',  235 pounds 6.4 ounces  . CKD (chronic kidney disease), stage IV     Recent acute on chronic exacerbation in July 2014  . PAD (peripheral artery disease)   . Diabetic foot ulcer   . Dry gangrene     Left second toe; now on Sole of L foot    PAST SURGICAL HISTORY: Past Surgical History  Procedure Laterality Date  . Abdominal hysterectomy    . Leg tendon surgery      patient fell in a store right leg  . Leg surgery      hole in bone( during Childhood)  . Cardiac catheterization  03/31/2009    Proximal RCA thrombotic occlusion (inferior STEMI) other coronaries but in a codominant system  . Coronary angioplasty  03/31/2009    PTCA , bare-metal stent 3.5 mm x 24 mm ostium of RCA ,EF 55%  . Amputation Left 08/07/2013    Procedure: left midfoot amputation;  Surgeon: Marianna Payment, MD;  Location: [facility];  Service: Orthopedics;  Laterality: Left;  . Amputation Left 08/09/2013    Procedure: AMPUTATION BELOW KNEE;  Surgeon: Marianna Payment, MD;  Location: [facility];  Service: Orthopedics;  Laterality: Left;    SOCIAL HISTORY:  reports that she quit smoking about 18 years ago. She has  never used smokeless tobacco. She reports that she does not drink alcohol or use illicit drugs.  FAMILY HISTORY:  Family History  Problem Relation Age of Onset  . Alcoholism Mother     Died from complications  . Alcoholism Father     Died from complications  . Heart disease      Unknown, unclear. Patient is not a good historian.  Essentially no pertinent history known    CURRENT MEDICATIONS: Reviewed per MAR  REVIEW OF SYSTEMS:  See HPI otherwise 14 point ROS is negative.  PHYSICAL EXAMINATION  VS:  T 97.9       P 72      RR 22      BP 176/86      POX%        WT (Lb) 223.8    GENERAL: no acute distress, morbidly obese body habitus EYES: conjunctivae normal, sclerae  normal, normal eye lids MOUTH/THROAT: lips without lesions,no lesions in the mouth,tongue is without lesions,uvula elevates in midline NECK: supple, trachea midline, no neck masses, no thyroid tenderness, no thyromegaly LYMPHATICS: no LAN in the neck, no supraclavicular LAN RESPIRATORY: breathing is even & unlabored, BS CTAB CARDIAC: RRR, no murmur,no extra heart sounds   EDEMA/VARICOSITIES: right lower extremity has +2 edema ARTERIAL: pedal pulses +1   GI:  ABDOMEN: abdomen soft, normal BS, no masses, no tenderness  LIVER/SPLEEN: no hepatomegaly, no splenomegaly MUSCULOSKELETAL: HEAD: normal to inspection & palpation BACK: no kyphosis, scoliosis or spinal processes tenderness EXTREMITIES: LEFT UPPER EXTREMITY: full range of motion, normal strength & tone RIGHT UPPER EXTREMITY:  full range of motion, normal strength & tone LEFT LOWER EXTREMITY: status post BKA and has a cast   RIGHT LOWER EXTREMITY:  full range of motion, normal strength & tone PSYCHIATRIC: the patient is alert & oriented to person, affect & behavior appropriate  LABS/RADIOLOGY: Chest x-ray:  No acute disease.    Hemoglobin 8.9, otherwise CBC normal.    Glucose 192, BUN 31, creatinine 1.72, albumin 2, alkaline phosphatase 125, otherwise CMP normal.    Blood culture x2 showed no growth.    MRSA by PCR negative.     ASSESSMENT/PLAN:  Left foot gangrene.  Status post transmetatarsal amputation and left BKA.  Continue rehabilitation and wound care.      Acute blood loss anemia.  Status post transfusion.  Continue iron.  Reassess.    CAD.  Stable.    Hypertension.  Uncontrolled.  Start Norvasc 10 mg q.d.    Diabetes mellitus with vascular complications.  Continue current medications.    Chronic kidney disease stage IV.  Reassess renal functions.    Peripheral neuropathy.  Continue gabapentin.     Check CBC and BMP.    I have reviewed patient's medical records received at admission/from  hospitalization.  CPT CODE: 13086

## 2013-09-24 ENCOUNTER — Other Ambulatory Visit: Payer: Self-pay

## 2013-09-25 DIAGNOSIS — D631 Anemia in chronic kidney disease: Secondary | ICD-10-CM | POA: Insufficient documentation

## 2013-09-25 NOTE — Progress Notes (Signed)
Patient ID: [name], female   DOB: 31-Oct-1951, 62 y.o.   MRN: [id number]        PROGRESS NOTE  DATE: 09/01/2013  FACILITY:  [facility]  LEVEL OF CARE: SNF (31)  Acute Visit  CHIEF COMPLAINT:  Manage anemia of chronic kidney disease and chronic kidney disease stage IV.    HISTORY OF PRESENT ILLNESS: I was requested by the staff to assess the patient regarding above problem(s):  ANEMIA: The anemia has been stable. The patient denies fatigue, melena or hematochezia. No complications from the medications currently being used.  On 08/27/2013:  Hemoglobin 9.3, MCV 85.3.  On 08/13/2013:  Hemoglobin 8.9.    CHRONIC KIDNEY DISEASE: The patient's chronic kidney disease remains stable.  Patient denies increasing lower extremity swelling or confusion. Last BUN and creatinine are:   On 08/27/2013:  BUN 43, creatinine 1.79.  On 08/13/2013:  BUN 31, creatinine 1.72.    PAST MEDICAL HISTORY : Reviewed.  No changes.  CURRENT MEDICATIONS: Reviewed per [facility]  REVIEW OF SYSTEMS:  GENERAL: no change in appetite, no fatigue, no weight changes, no fever, chills or weakness RESPIRATORY: no cough, SOB, DOE,, wheezing, hemoptysis CARDIAC: no chest pain or palpitations; right lower extremity has swelling   GI: no abdominal pain, diarrhea, constipation, heart burn, nausea or vomiting  PHYSICAL EXAMINATION  GENERAL: no acute distress, moderately obese body habitus NECK: supple, trachea midline, no neck masses, no thyroid tenderness, no thyromegaly RESPIRATORY: breathing is even & unlabored, BS CTAB CARDIAC: RRR, no murmur,no extra heart sounds   EDEMA/VARICOSITIES: right lower extremity has +2 edema ARTERIAL: pedal pulses nonpalpable, left BKA    GI: abdomen soft, normal BS, no masses, no tenderness, no hepatomegaly, no splenomegaly PSYCHIATRIC: the patient is alert & oriented to person, affect & behavior appropriate  ASSESSMENT/PLAN:  Anemia of chronic kidney disease.   Hemoglobin improved.     Chronic kidney disease stage IV.  Renal functions stable.    CPT CODE: 09811

## 2013-10-13 ENCOUNTER — Ambulatory Visit: Payer: Medicare Other | Admitting: Cardiovascular Disease

## 2013-10-22 ENCOUNTER — Ambulatory Visit: Payer: Medicare Other | Admitting: Nurse Practitioner

## 2013-10-22 NOTE — Progress Notes (Signed)
Patient no showed for appointment

## 2013-11-16 ENCOUNTER — Ambulatory Visit: Payer: Medicare Other | Admitting: Cardiovascular Disease

## 2013-11-20 ENCOUNTER — Emergency Department (HOSPITAL_COMMUNITY): Payer: Medicare Other

## 2013-11-20 ENCOUNTER — Encounter (HOSPITAL_COMMUNITY): Payer: Self-pay | Admitting: Emergency Medicine

## 2013-11-20 ENCOUNTER — Emergency Department (HOSPITAL_COMMUNITY)
Admission: EM | Admit: 2013-11-20 | Discharge: 2013-11-20 | Disposition: A | Discharge status: Home / Self Care | Payer: Medicare Other | Attending: Emergency Medicine | Admitting: Emergency Medicine

## 2013-11-20 DIAGNOSIS — Z794 Long term (current) use of insulin: Secondary | ICD-10-CM | POA: Insufficient documentation

## 2013-11-20 DIAGNOSIS — Z7982 Long term (current) use of aspirin: Secondary | ICD-10-CM | POA: Insufficient documentation

## 2013-11-20 DIAGNOSIS — E1149 Type 2 diabetes mellitus with other diabetic neurological complication: Secondary | ICD-10-CM | POA: Insufficient documentation

## 2013-11-20 DIAGNOSIS — Z8542 Personal history of malignant neoplasm of other parts of uterus: Secondary | ICD-10-CM | POA: Insufficient documentation

## 2013-11-20 DIAGNOSIS — E785 Hyperlipidemia, unspecified: Secondary | ICD-10-CM | POA: Insufficient documentation

## 2013-11-20 DIAGNOSIS — S98919A Complete traumatic amputation of unspecified foot, level unspecified, initial encounter: Secondary | ICD-10-CM | POA: Insufficient documentation

## 2013-11-20 DIAGNOSIS — E1159 Type 2 diabetes mellitus with other circulatory complications: Secondary | ICD-10-CM | POA: Insufficient documentation

## 2013-11-20 DIAGNOSIS — L988 Other specified disorders of the skin and subcutaneous tissue: Secondary | ICD-10-CM | POA: Insufficient documentation

## 2013-11-20 DIAGNOSIS — N184 Chronic kidney disease, stage 4 (severe): Secondary | ICD-10-CM | POA: Insufficient documentation

## 2013-11-20 DIAGNOSIS — Z87891 Personal history of nicotine dependence: Secondary | ICD-10-CM | POA: Insufficient documentation

## 2013-11-20 DIAGNOSIS — Z9889 Other specified postprocedural states: Secondary | ICD-10-CM | POA: Insufficient documentation

## 2013-11-20 DIAGNOSIS — L97409 Non-pressure chronic ulcer of unspecified heel and midfoot with unspecified severity: Secondary | ICD-10-CM | POA: Insufficient documentation

## 2013-11-20 DIAGNOSIS — Z95818 Presence of other cardiac implants and grafts: Secondary | ICD-10-CM | POA: Insufficient documentation

## 2013-11-20 DIAGNOSIS — I2510 Atherosclerotic heart disease of native coronary artery without angina pectoris: Secondary | ICD-10-CM | POA: Insufficient documentation

## 2013-11-20 DIAGNOSIS — Z8673 Personal history of transient ischemic attack (TIA), and cerebral infarction without residual deficits: Secondary | ICD-10-CM | POA: Insufficient documentation

## 2013-11-20 DIAGNOSIS — Z792 Long term (current) use of antibiotics: Secondary | ICD-10-CM | POA: Insufficient documentation

## 2013-11-20 DIAGNOSIS — G909 Disorder of the autonomic nervous system, unspecified: Secondary | ICD-10-CM | POA: Insufficient documentation

## 2013-11-20 DIAGNOSIS — E1169 Type 2 diabetes mellitus with other specified complication: Secondary | ICD-10-CM | POA: Insufficient documentation

## 2013-11-20 DIAGNOSIS — I252 Old myocardial infarction: Secondary | ICD-10-CM | POA: Insufficient documentation

## 2013-11-20 DIAGNOSIS — I129 Hypertensive chronic kidney disease with stage 1 through stage 4 chronic kidney disease, or unspecified chronic kidney disease: Secondary | ICD-10-CM | POA: Insufficient documentation

## 2013-11-20 DIAGNOSIS — Z79899 Other long term (current) drug therapy: Secondary | ICD-10-CM | POA: Insufficient documentation

## 2013-11-20 LAB — BASIC METABOLIC PANEL
BUN: 48 mg/dL — AB (ref 6–23)
CO2: 21 mEq/L (ref 19–32)
CREATININE: 1.62 mg/dL — AB (ref 0.50–1.10)
Calcium: 9.6 mg/dL (ref 8.4–10.5)
Chloride: 107 mEq/L (ref 96–112)
GFR calc Af Amer: 38 mL/min — ABNORMAL LOW (ref >90)
GFR calc non Af Amer: 33 mL/min — ABNORMAL LOW (ref >90)
GLUCOSE: 204 mg/dL — AB (ref 70–99)
Potassium: 5 mEq/L (ref 3.7–5.3)
Sodium: 140 mEq/L (ref 137–147)

## 2013-11-20 LAB — CBC WITH DIFFERENTIAL/PLATELET
Basophils Absolute: 0 10*3/uL (ref 0.0–0.1)
Basophils Relative: 0 % (ref 0–1)
EOS PCT: 4 % (ref 0–5)
Eosinophils Absolute: 0.2 10*3/uL (ref 0.0–0.7)
HCT: 32.5 % — ABNORMAL LOW (ref 36.0–46.0)
Hemoglobin: 10.5 g/dL — ABNORMAL LOW (ref 12.0–15.0)
LYMPHS ABS: 2.1 10*3/uL (ref 0.7–4.0)
Lymphocytes Relative: 34 % (ref 12–46)
MCH: 28.2 pg (ref 26.0–34.0)
MCHC: 32.3 g/dL (ref 30.0–36.0)
MCV: 87.1 fL (ref 78.0–100.0)
MONO ABS: 0.5 10*3/uL (ref 0.1–1.0)
Monocytes Relative: 8 % (ref 3–12)
Neutro Abs: 3.3 10*3/uL (ref 1.7–7.7)
Neutrophils Relative %: 54 % (ref 43–77)
Platelets: 237 10*3/uL (ref 150–400)
RBC: 3.73 MIL/uL — AB (ref 3.87–5.11)
RDW: 15.8 % — ABNORMAL HIGH (ref 11.5–15.5)
WBC: 6.1 10*3/uL (ref 4.0–10.5)

## 2013-11-20 LAB — GLUCOSE, CAPILLARY: Glucose-Capillary: 186 mg/dL — ABNORMAL HIGH (ref 70–99)

## 2013-11-20 MED ORDER — CEFTRIAXONE SODIUM 1 G IJ SOLR
1.0000 g | Freq: Once | INTRAMUSCULAR | Status: AC
  Administered 2013-11-20: 1 g via INTRAMUSCULAR
  Filled 2013-11-20: qty 10

## 2013-11-20 MED ORDER — LIDOCAINE HCL 1 % IJ SOLN
2.0000 mL | Freq: Once | INTRAMUSCULAR | Status: AC
  Administered 2013-11-20: 2 mL
  Filled 2013-11-20: qty 20
  Filled 2013-11-20: qty 2

## 2013-11-20 MED ORDER — DOXYCYCLINE HYCLATE 100 MG PO CAPS
100.0000 mg | ORAL_CAPSULE | Freq: Two times a day (BID) | ORAL | Status: DC
Start: 2013-11-20 — End: 2013-12-15

## 2013-11-20 NOTE — ED Notes (Signed)
Wound Care appointment made for 1/16 @ 2pm.  Pt given address, phone number, and appointment date/time.

## 2013-11-20 NOTE — ED Notes (Signed)
Pt c/o blister on right big toe that has been there a couple of days that pop and draining clear liquid this morning.  Pt denies pain. Pt has PMH of diabetes.

## 2013-11-20 NOTE — ED Notes (Signed)
Pt sts blister "just appeared" on R great toe.  Denies pain and injury.  Reports that she lost her L foot "months after a blister appeared on it."

## 2013-11-20 NOTE — ED Notes (Addendum)
Pt escorted to discharge window. Verbalized understanding discharge instructions. In no acute distress.  Pt educated on wound care and the importance of taking antibiotic.

## 2013-11-20 NOTE — ED Provider Notes (Signed)
CSN: [id number]     Arrival date & time 11/20/13  1126 History   First MD Initiated Contact with Patient 11/20/13 1134     Chief Complaint  Patient presents with  . blister popped on foot    (Consider location/radiation/quality/duration/timing/severity/associated sxs/prior Treatment) Patient is a 63 y.o. female presenting with lower extremity pain. The history is provided by the patient (the pt complains of a blister to right foot for one day).  Foot Pain This is a new problem. The current episode started 12 to 24 hours ago. The problem occurs constantly. The problem has not changed since onset.Pertinent negatives include no chest pain, no abdominal pain and no headaches. Nothing aggravates the symptoms. Nothing relieves the symptoms.    Past Medical History  Diagnosis Date  . ST elevation myocardial infarction (STEMI) of inferior wall  03/2009    Ostial/proximal RCA occlusion treated with BMS stent  . CAD (coronary artery disease), native coronary artery      status post PCI to the RCA for inferior STEMI  . Cancer     endo ca  . Hypertension associated with diabetes   . Hyperlipidemia LDL goal <70   . Diabetes mellitus, type II, insulin dependent     With complications - CAD, CVA, peripheral ulcer ,  . Stroke/cerebrovascular accident  4/30/ 2014    Right-sided weakness, mostly with balance issues and only mild weakness.  . Diabetic peripheral neuropathy associated with type 2 diabetes mellitus   . Obesity, Class III, BMI 40-49.9 (morbid obesity)     BMI 43; 5' 2',  235 pounds 6.4 ounces  . CKD (chronic kidney disease), stage IV     Recent acute on chronic exacerbation in July 2014  . PAD (peripheral artery disease)   . Diabetic foot ulcer   . Dry gangrene     Left second toe; now on Sole of L foot   Past Surgical History  Procedure Laterality Date  . Abdominal hysterectomy    . Leg tendon surgery      patient fell in a store right leg  . Leg surgery      hole in bone( during  Childhood)  . Cardiac catheterization  03/31/2009    Proximal RCA thrombotic occlusion (inferior STEMI) other coronaries but in a codominant system  . Coronary angioplasty  03/31/2009    PTCA , bare-metal stent 3.5 mm x 24 mm ostium of RCA ,EF 55%  . Amputation Left 08/07/2013    Procedure: left midfoot amputation;  Surgeon: Marianna Payment, MD;  Location: [facility];  Service: Orthopedics;  Laterality: Left;  . Amputation Left 08/09/2013    Procedure: AMPUTATION BELOW KNEE;  Surgeon: Marianna Payment, MD;  Location: [facility];  Service: Orthopedics;  Laterality: Left;   Family History  Problem Relation Age of Onset  . Alcoholism Mother     Died from complications  . Alcoholism Father     Died from complications  . Heart disease      Unknown, unclear. Patient is not a good historian.  Essentially no pertinent history known   History  Substance Use Topics  . Smoking status: Former Smoker    Quit date: 04/29/1995  . Smokeless tobacco: Never Used  . Alcohol Use: No   OB History   Grav Para Term Preterm Abortions TAB SAB Ect Mult Living                 Review of Systems  Constitutional: Negative for appetite change and  fatigue.  HENT: Negative for congestion, ear discharge and sinus pressure.   Eyes: Negative for discharge.  Respiratory: Negative for cough.   Cardiovascular: Negative for chest pain.  Gastrointestinal: Negative for abdominal pain and diarrhea.  Genitourinary: Negative for frequency and hematuria.  Musculoskeletal: Negative for back pain.       Blister to foot  Skin: Negative for rash.  Neurological: Negative for seizures and headaches.  Psychiatric/Behavioral: Negative for hallucinations.    Allergies  Review of patient's allergies indicates no known allergies.  Home Medications   Current Outpatient Rx  Name  Route  Sig  Dispense  Refill  . aspirin 325 MG tablet   Oral   Take 325 mg by mouth daily. Take 30 minutes after taking  500 cr niaspan          . atorvastatin (LIPITOR) 40 MG tablet   Oral   Take 1 tablet (40 mg total) by mouth daily at 6 PM.         . carvedilol (COREG) 25 MG tablet   Oral   Take 25 mg by mouth 2 (two) times daily with a meal.         . ferrous sulfate 325 (65 FE) MG tablet   Oral   Take 325 mg by mouth daily with breakfast.         . furosemide (LASIX) 40 MG tablet   Oral   Take 1 tablet (40 mg total) by mouth daily.   60 tablet   3   . gabapentin (NEURONTIN) 100 MG capsule   Oral   Take 1 capsule (100 mg total) by mouth 2 (two) times daily.   60 capsule      . insulin aspart (NOVOLOG) 100 UNIT/ML injection   Subcutaneous   Inject 0-15 Units into the skin 3 (three) times daily with meals. Sliding scale insulin CBG 70 - 120: 0 units: CBG 121 - 150: 2 units; CBG 151 - 200: 3 units; CBG 201 - 250: 5 units; CBG 251 - 300: 8 units;CBG 301 - 350: 11 units; CBG 351 - 400: 15 units; CBG > 400 : 15 units and notify your doctor   1 vial   12   . insulin glargine (LANTUS) 100 UNIT/ML injection   Subcutaneous   Inject 30 Units into the skin at bedtime.         . niacin (NIASPAN) 500 MG CR tablet      500 mg. Take 1 tablet (500 mg total) by mouth at bedtime. Take with snack.  30 minutes prior to taking medication take aspirin 325 mg daily.         Marland Kitchen omega-3 acid ethyl esters (LOVAZA) 1 G capsule   Oral   Take 1 g by mouth 3 (three) times daily.          Marland Kitchen doxycycline (VIBRAMYCIN) 100 MG capsule   Oral   Take 1 capsule (100 mg total) by mouth 2 (two) times daily. One po bid x 7 days   20 capsule   0    BP 206/80  Pulse 81  Temp(Src) 98.1 F (36.7 C) (Oral)  Resp 20  SpO2 96% Physical Exam  Constitutional: She is oriented to person, place, and time. She appears well-developed.  HENT:  Head: Normocephalic.  Eyes: Conjunctivae and EOM are normal. No scleral icterus.  Neck: Neck supple. No thyromegaly present.  Cardiovascular: Normal rate and regular rhythm.  Exam reveals no gallop  and no friction rub.  No murmur heard. Pulmonary/Chest: No stridor. She has no wheezes. She has no rales. She exhibits no tenderness.  Abdominal: She exhibits no distension. There is no tenderness. There is no rebound.  Musculoskeletal: Normal range of motion. She exhibits no edema.  Below knee amputaion on left.  Right foot with blister 2 cm diamter.  Serous fluid  Lymphadenopathy:    She has no cervical adenopathy.  Neurological: She is oriented to person, place, and time. She exhibits normal muscle tone. Coordination normal.  Skin: No rash noted. No erythema.  Psychiatric: She has a normal mood and affect. Her behavior is normal.    ED Course  Procedures (including critical care time) Labs Review Labs Reviewed  CBC WITH DIFFERENTIAL - Abnormal; Notable for the following:    RBC 3.73 (*)    Hemoglobin 10.5 (*)    HCT 32.5 (*)    RDW 15.8 (*)    All other components within normal limits  BASIC METABOLIC PANEL - Abnormal; Notable for the following:    Glucose, Bld 204 (*)    BUN 48 (*)    Creatinine, Ser 1.62 (*)    GFR calc non Af Amer 33 (*)    GFR calc Af Amer 38 (*)    All other components within normal limits  GLUCOSE, CAPILLARY - Abnormal; Notable for the following:    Glucose-Capillary 186 (*)    All other components within normal limits   Imaging Review Dg Foot 2 Views Right  11/20/2013   CLINICAL DATA:  Blister on medial side of great toe though.  EXAM: RIGHT FOOT - 2 VIEW  COMPARISON:  None.  FINDINGS: Regional vascular calcification is noted. There is soft tissue swelling of the forefoot. There is no evidence of fracture, dislocation or definable osteomyelitis.  IMPRESSION: Vascular calcification and soft tissue swelling. No acute or focal bony finding.   Electronically Signed   By: Nelson Chimes M.D.   On: 11/20/2013 12:50    EKG Interpretation   None       MDM   1. Foot and toe(s), blister, right, initial encounter       Maudry Diego, MD 11/20/13  1344

## 2013-11-20 NOTE — ED Notes (Signed)
Patient transported to X-ray 

## 2013-11-20 NOTE — Discharge Instructions (Signed)
Follow up next week at the wound care center at [facility].  Follow up with your md next week

## 2013-12-09 ENCOUNTER — Encounter (HOSPITAL_BASED_OUTPATIENT_CLINIC_OR_DEPARTMENT_OTHER): Payer: Medicare Other

## 2013-12-15 ENCOUNTER — Ambulatory Visit (INDEPENDENT_AMBULATORY_CARE_PROVIDER_SITE_OTHER): Payer: Medicare Other | Admitting: Cardiovascular Disease

## 2013-12-15 ENCOUNTER — Encounter: Payer: Self-pay | Admitting: Cardiovascular Disease

## 2013-12-15 VITALS — BP 146/90 | HR 71 | Ht 62.0 in | Wt 243.6 lb

## 2013-12-15 DIAGNOSIS — E1159 Type 2 diabetes mellitus with other circulatory complications: Secondary | ICD-10-CM

## 2013-12-15 DIAGNOSIS — I739 Peripheral vascular disease, unspecified: Secondary | ICD-10-CM

## 2013-12-15 DIAGNOSIS — E1169 Type 2 diabetes mellitus with other specified complication: Secondary | ICD-10-CM

## 2013-12-15 DIAGNOSIS — I10 Essential (primary) hypertension: Secondary | ICD-10-CM

## 2013-12-15 NOTE — Patient Instructions (Signed)
Your physician recommends that you schedule a follow-up appointment in: 3 Months  

## 2013-12-15 NOTE — Progress Notes (Signed)
Patient ID: [name], female   DOB: 01/07/1951, 63 y.o.   MRN: [id number]  Chief Complaint  Patient presents with  . 3 mo rov    patient complains of right foot pain and discoloration since around jan 2   HPI [name] is a 63 y.o. female with h/o CAD, HTN, DM2, HLD, CKD 4, PAD and recent right BKA who presents for follow up of peripheral vascular disease. Her primary cardiologist is Dr. Ellyn Hack, her orthopedic surgeon is Dr. Sharol Given and her nephrologist is Dr. Moshe Cipro.   Patient had initially been evaluated by Dr. Gwenlyn Found for evaluation of left second toe gangrene. She had lower extremity doppler study done at the time on 06/29/13 which showed occluded left popliteal artery and ABI of 0.74. Her right leg showed occlusion of the distal posterior tibial artery but normal flow in dorsalis pedis and popliteal. Angiography was not pursued due to her renal function. In September 2014, she developed worsening gangrene of left foot with fever and elevated WBC and underwent BKA on 08/09/13.  Three weeks ago, she developed a vesicular lesion on her right medial great toe. She was evaluated for it in the ED and recommendation was to keep it covered with antibiotic ointment. She has an appointment with podiatry and the foot center early February. She denies any pain in the toe. She feels like it is healing compared to when it first started.   She otherwise denies any chest pain, shortness of breath, palpitations. She does have some swelling in her right leg but improved compared to 3 weeks ago.   Past Medical History  Diagnosis Date  . ST elevation myocardial infarction (STEMI) of inferior wall  03/2009    Ostial/proximal RCA occlusion treated with BMS stent  . CAD (coronary artery disease), native coronary artery      status post PCI to the RCA for inferior STEMI  . Cancer     endo ca  . Hypertension associated with diabetes   . Hyperlipidemia LDL goal <70   . Diabetes mellitus, type II, insulin  dependent     With complications - CAD, CVA, peripheral ulcer ,  . Stroke/cerebrovascular accident  4/30/ 2014    Right-sided weakness, mostly with balance issues and only mild weakness.  . Diabetic peripheral neuropathy associated with type 2 diabetes mellitus   . Obesity, Class III, BMI 40-49.9 (morbid obesity)     BMI 43; 5' 2',  235 pounds 6.4 ounces  . CKD (chronic kidney disease), stage IV     Recent acute on chronic exacerbation in July 2014  . PAD (peripheral artery disease)   . Diabetic foot ulcer   . Dry gangrene     Left second toe; now on Sole of L foot    Past Surgical History  Procedure Laterality Date  . Abdominal hysterectomy    . Leg tendon surgery      patient fell in a store right leg  . Leg surgery      hole in bone( during Childhood)  . Cardiac catheterization  03/31/2009    Proximal RCA thrombotic occlusion (inferior STEMI) other coronaries but in a codominant system  . Coronary angioplasty  03/31/2009    PTCA , bare-metal stent 3.5 mm x 24 mm ostium of RCA ,EF 55%  . Amputation Left 08/07/2013    Procedure: left midfoot amputation;  Surgeon: Marianna Payment, MD;  Location: [facility];  Service: Orthopedics;  Laterality: Left;  . Amputation Left 08/09/2013  Procedure: AMPUTATION BELOW KNEE;  Surgeon: Marianna Payment, MD;  Location: [facility];  Service: Orthopedics;  Laterality: Left;    Family History  Problem Relation Age of Onset  . Alcoholism Mother     Died from complications  . Alcoholism Father     Died from complications  . Heart disease      Unknown, unclear. Patient is not a good historian.  Essentially no pertinent history known    Social History History  Substance Use Topics  . Smoking status: Former Smoker    Quit date: 04/29/1995  . Smokeless tobacco: Never Used  . Alcohol Use: No    No Known Allergies  Current Outpatient Prescriptions  Medication Sig Dispense Refill  . aspirin 325 MG tablet Take 325 mg by mouth daily. Take 30  minutes after taking  500 cr niaspan      . atorvastatin (LIPITOR) 40 MG tablet Take 1 tablet (40 mg total) by mouth daily at 6 PM.      . carvedilol (COREG) 25 MG tablet Take 25 mg by mouth 2 (two) times daily with a meal.      . ferrous sulfate 325 (65 FE) MG tablet Take 325 mg by mouth daily with breakfast.      . furosemide (LASIX) 40 MG tablet Take 1 tablet (40 mg total) by mouth daily.  60 tablet  3  . gabapentin (NEURONTIN) 100 MG capsule Take 1 capsule (100 mg total) by mouth 2 (two) times daily.  60 capsule    . insulin aspart (NOVOLOG) 100 UNIT/ML injection Inject 0-15 Units into the skin 3 (three) times daily with meals. Sliding scale insulin CBG 70 - 120: 0 units: CBG 121 - 150: 2 units; CBG 151 - 200: 3 units; CBG 201 - 250: 5 units; CBG 251 - 300: 8 units;CBG 301 - 350: 11 units; CBG 351 - 400: 15 units; CBG > 400 : 15 units and notify your doctor  1 vial  12  . insulin glargine (LANTUS) 100 UNIT/ML injection Inject 30 Units into the skin at bedtime.      Marland Kitchen omega-3 acid ethyl esters (LOVAZA) 1 G capsule Take 1 g by mouth 3 (three) times daily.       . niacin (NIASPAN) 500 MG CR tablet 500 mg. Take 1 tablet (500 mg total) by mouth at bedtime. Take with snack.  30 minutes prior to taking medication take aspirin 325 mg daily.       No current facility-administered medications for this visit.    Review of Systems Review of Systems Negative except per HPI Blood pressure 146/90, pulse 71, height 5\' 2"  (1.575 m), weight 243 lb 9.6 oz (110.496 kg).  Physical Exam Physical Exam General: no acute distress, pleasant, sitting in wheelchair.  HEENT: EOMI, no carotid bruit appreciated, no JVD CV: [id number], RRR, 2/6 systolic murmur best heard at left sternal border Pulm: clear to auscultation bilaterally with normal work of breathing, no crackles or wheezing Extr: left BKA, right great toe: dry collapsed vesicle on medial aspect of great toe, no tenderness, no warmth, no surrounding  erythema Strong dorsalis pedis pulse appreciated, posterior tibial pulse difficult to palpate 2+ pitting pedal edema present  Data Reviewed EKG: sinus rhythm with rate of 71 bpm, non specific T wave changes in leads I and III  BMET    Component Value Date/Time   NA 140 11/20/2013 1200   K 5.0 11/20/2013 1200   CL 107 11/20/2013 1200   CO2  21 11/20/2013 1200   GLUCOSE 204* 11/20/2013 1200   BUN 48* 11/20/2013 1200   CREATININE 1.62* 11/20/2013 1200   CALCIUM 9.6 11/20/2013 1200   GFRNONAA 59* 11/20/2013 1200   GFRAA 48* 11/20/2013 1200     Assessment    64 y.o. female with h/o CAD, HTN, DM2, HLD, CKD 4, PAD and recent right BKA who presents for follow up of peripheral vascular disease.    Plan    1. PAD: s/p left BKA. Now with healing ulcer of right toe. Despite no posterior tibial flow on recent doppler, she has a good dorsalis pedis pulse which is likely perfusing great toe adequately for healing.  - no imaging needed at this time - follow up with podiatry and wound care - follow up with Dr. Gwenlyn Found in 3 months or sooner if needed 2. CAD: STEMI of the inferior wall in 2010. Ostial/proximal RCA occlusion treated with BMS stent. Followed by Dr. Ellyn Hack. Asymptomatic currently - follow up with Dr. Ellyn Hack as scheduled - continue current medical therapy 3. CKD4: improved from a previous episode of acute on chronic renal failure. Recent Cr: 1.62 on 11/20/13 - follow up with Dr. Moshe Cipro 4. Type 2 diabetes:  - follow up with PCP 5. HTN: stable Continue current therapy 6. Dyslipidemia: on atorvastatin 40mg  daily.  - follow with PCP     Liam Graham 12/15/2013, 11:27 AM Family Medicine Resident PGY-3  Endocrine with the assessment and plan as outlined by Dr. Otis Dials. The patient is a patient of Dr. Darcus Pester. I follow her for PAD. She has had a left TKA for nonhealing ulcer since I saw her last. She has chronic renal insufficiency. Lower extremity artery Doppler studies show a widely patent  right iliac SFA and popliteal artery with one vessel runoff below the knee the anterior tibial and dorsalis pedis. She does have a small area of ecchymosis in her right great toe but a 2+ dorsalis pedis pulse. I do not think that she is at risk of losing the toe is suggested she keep her appointment at the wound care center. I will see her back in 3 months.   Lorretta Harp, M.D., [place], [facility], [place] [facility] [address] [place], [place]  [address]  [phone] 12/15/2013 12:28 PM

## 2013-12-15 NOTE — Assessment & Plan Note (Signed)
Returns for followup of her PAD. Since I last saw her she's had a left below the knee amputation. She does have a superficial area of ecchymosis in her right great toe with Dopplers did show an occluded posterior tibial and peroneal and a patent dorsalis pedis. On exam she has a 2+ DP. At this point, given her chronic renal insufficiency, I do not think a more aggressive approach is warranted but rather continued outpatient followup in the wound care center.

## 2013-12-24 ENCOUNTER — Encounter (HOSPITAL_BASED_OUTPATIENT_CLINIC_OR_DEPARTMENT_OTHER): Payer: Medicare Other | Attending: General Surgery

## 2013-12-24 DIAGNOSIS — E1169 Type 2 diabetes mellitus with other specified complication: Secondary | ICD-10-CM | POA: Insufficient documentation

## 2013-12-24 DIAGNOSIS — S88119A Complete traumatic amputation at level between knee and ankle, unspecified lower leg, initial encounter: Secondary | ICD-10-CM | POA: Insufficient documentation

## 2013-12-24 DIAGNOSIS — G609 Hereditary and idiopathic neuropathy, unspecified: Secondary | ICD-10-CM | POA: Insufficient documentation

## 2013-12-24 DIAGNOSIS — E785 Hyperlipidemia, unspecified: Secondary | ICD-10-CM | POA: Insufficient documentation

## 2013-12-24 DIAGNOSIS — Z79899 Other long term (current) drug therapy: Secondary | ICD-10-CM | POA: Insufficient documentation

## 2013-12-24 DIAGNOSIS — Z90710 Acquired absence of both cervix and uterus: Secondary | ICD-10-CM | POA: Insufficient documentation

## 2013-12-24 DIAGNOSIS — I739 Peripheral vascular disease, unspecified: Secondary | ICD-10-CM | POA: Insufficient documentation

## 2013-12-24 DIAGNOSIS — I129 Hypertensive chronic kidney disease with stage 1 through stage 4 chronic kidney disease, or unspecified chronic kidney disease: Secondary | ICD-10-CM | POA: Insufficient documentation

## 2013-12-24 DIAGNOSIS — L97509 Non-pressure chronic ulcer of other part of unspecified foot with unspecified severity: Secondary | ICD-10-CM | POA: Insufficient documentation

## 2013-12-24 DIAGNOSIS — Z7982 Long term (current) use of aspirin: Secondary | ICD-10-CM | POA: Insufficient documentation

## 2013-12-24 DIAGNOSIS — I2510 Atherosclerotic heart disease of native coronary artery without angina pectoris: Secondary | ICD-10-CM | POA: Insufficient documentation

## 2013-12-24 DIAGNOSIS — N189 Chronic kidney disease, unspecified: Secondary | ICD-10-CM | POA: Insufficient documentation

## 2013-12-25 NOTE — Progress Notes (Signed)
[facility]  [name], [name]               ACCOUNT NO.:  [id number]  MEDICAL RECORD NO.:  [id number]      DATE OF BIRTH:  04-Oct-1951  PHYSICIAN:  Ricard Dillon, M.D. VISIT DATE:  12/24/2013                                  OFFICE VISIT   HISTORY OF PRESENT ILLNESS:  This is a 63 year old woman, who presented to [facility] ER with a blister on her right foot.  This was on November 20, 2013.  She has had a history of type 2 diabetes with a history of PAD.  She is status post left below-knee amputation.  X-ray of the right foot at that time showed soft tissue swelling of the forefoot without any bony finding.  I believe the original blister was excised with serous fluid obtained.  She is here for our review of the area.  PAST MEDICAL HISTORY:  Coronary artery disease, hypertension, hyperlipidemia, type 2 diabetes with PAD and peripheral neuropathy, chronic renal failure, and history of left below-knee amputation.  SURGICAL HISTORY:  Abdominal hysterectomy, right lower extremity tendon surgery, cardiac catheterization with coronary angioplasty, left transmetatarsal amputation culminating in the left below-knee amputation on 07/2013.  Cardiac stent.  MEDICATION LIST:  Reviewed includes: 1. ASA 325 daily. 2. Lipitor 40 daily. 3. Carvedilol 25 b.i.d. 4. Doxycycline 100 b.i.d. 5. Furosemide 40 daily. 6. Gabapentin 100 b.i.d. 7. Lantus 30 daily. 8. Niaspan 500 at bedtime.  PHYSICAL EXAMINATION:  VITAL SIGNS:  Temperature is 98.3, pulse 82, respirations 16, blood pressure 171/86.  Capillary blood glucose at 164. Vascular assessment in this clinic ABI in the right was 1.0, previous arterial Doppler and ABIs on August 2014, showed a right ABI, that was not obtainable.  A right toe brachial pressure of 0.75.  Previous echocardiogram EF of 60% to 70%. RESPIRATORY:  Shallow, but otherwise clear air entry bilaterally. CARDIAC:  Heart sounds are normal.  There  is no murmurs, right foot. The area in question was on the plantar medial aspect of her first metatarsal head.  This was partially covered in a nonviable epithelium which was removed.  There is a small linear wound remaining here which was lightly debrided of some surface eschar.  After this, wound measurement was 0.9 x 2.4 x 0.1.  There is healthy looking surface to this surprisingly and I think there is no evidence of infection.  IMPRESSION:  Wegner's 2 diabetic foot ulcer on the right.  We applied silver alginate offloaded with felt and kept her in her healing sandal.  She appears to be doing very well here.  I do not see any obstacles to healing this area perhaps by next week.          ______________________________ Ricard Dillon, M.D.     MGR/MEDQ  D:  12/24/2013  T:  12/25/2013  Job:  [id number]

## 2014-01-04 ENCOUNTER — Ambulatory Visit: Payer: Medicare Other | Attending: Orthopedic Surgery | Admitting: Physical Therapy

## 2014-01-04 DIAGNOSIS — I2510 Atherosclerotic heart disease of native coronary artery without angina pectoris: Secondary | ICD-10-CM | POA: Insufficient documentation

## 2014-01-04 DIAGNOSIS — E119 Type 2 diabetes mellitus without complications: Secondary | ICD-10-CM | POA: Insufficient documentation

## 2014-01-04 DIAGNOSIS — N184 Chronic kidney disease, stage 4 (severe): Secondary | ICD-10-CM | POA: Insufficient documentation

## 2014-01-04 DIAGNOSIS — R5381 Other malaise: Secondary | ICD-10-CM | POA: Insufficient documentation

## 2014-01-04 DIAGNOSIS — R269 Unspecified abnormalities of gait and mobility: Secondary | ICD-10-CM | POA: Insufficient documentation

## 2014-01-04 DIAGNOSIS — Z8673 Personal history of transient ischemic attack (TIA), and cerebral infarction without residual deficits: Secondary | ICD-10-CM | POA: Insufficient documentation

## 2014-01-04 DIAGNOSIS — I129 Hypertensive chronic kidney disease with stage 1 through stage 4 chronic kidney disease, or unspecified chronic kidney disease: Secondary | ICD-10-CM | POA: Insufficient documentation

## 2014-01-04 DIAGNOSIS — S88119A Complete traumatic amputation at level between knee and ankle, unspecified lower leg, initial encounter: Secondary | ICD-10-CM | POA: Insufficient documentation

## 2014-01-04 DIAGNOSIS — Z4789 Encounter for other orthopedic aftercare: Secondary | ICD-10-CM | POA: Insufficient documentation

## 2014-01-06 ENCOUNTER — Ambulatory Visit: Payer: Medicare Other | Admitting: Physical Therapy

## 2014-01-11 ENCOUNTER — Ambulatory Visit: Payer: Medicare Other | Admitting: Physical Therapy

## 2014-01-11 ENCOUNTER — Emergency Department (HOSPITAL_COMMUNITY): Payer: Medicare Other

## 2014-01-11 ENCOUNTER — Inpatient Hospital Stay (HOSPITAL_COMMUNITY)
Admission: EM | Admit: 2014-01-11 | Discharge: 2014-01-15 | DRG: 291 | Disposition: A | Discharge status: Home / Self Care | Payer: Medicare Other | Attending: Internal Medicine | Admitting: Internal Medicine

## 2014-01-11 ENCOUNTER — Encounter (HOSPITAL_COMMUNITY): Payer: Self-pay | Admitting: Emergency Medicine

## 2014-01-11 DIAGNOSIS — N039 Chronic nephritic syndrome with unspecified morphologic changes: Secondary | ICD-10-CM

## 2014-01-11 DIAGNOSIS — E1159 Type 2 diabetes mellitus with other circulatory complications: Secondary | ICD-10-CM

## 2014-01-11 DIAGNOSIS — S88119A Complete traumatic amputation at level between knee and ankle, unspecified lower leg, initial encounter: Secondary | ICD-10-CM

## 2014-01-11 DIAGNOSIS — E1165 Type 2 diabetes mellitus with hyperglycemia: Secondary | ICD-10-CM | POA: Diagnosis present

## 2014-01-11 DIAGNOSIS — I10 Essential (primary) hypertension: Secondary | ICD-10-CM

## 2014-01-11 DIAGNOSIS — E1149 Type 2 diabetes mellitus with other diabetic neurological complication: Secondary | ICD-10-CM | POA: Diagnosis present

## 2014-01-11 DIAGNOSIS — Z7982 Long term (current) use of aspirin: Secondary | ICD-10-CM

## 2014-01-11 DIAGNOSIS — E66813 Obesity, class 3: Secondary | ICD-10-CM | POA: Diagnosis present

## 2014-01-11 DIAGNOSIS — Z9861 Coronary angioplasty status: Secondary | ICD-10-CM

## 2014-01-11 DIAGNOSIS — N184 Chronic kidney disease, stage 4 (severe): Secondary | ICD-10-CM | POA: Diagnosis present

## 2014-01-11 DIAGNOSIS — E785 Hyperlipidemia, unspecified: Secondary | ICD-10-CM | POA: Diagnosis present

## 2014-01-11 DIAGNOSIS — N179 Acute kidney failure, unspecified: Secondary | ICD-10-CM | POA: Diagnosis present

## 2014-01-11 DIAGNOSIS — Z87891 Personal history of nicotine dependence: Secondary | ICD-10-CM

## 2014-01-11 DIAGNOSIS — D638 Anemia in other chronic diseases classified elsewhere: Secondary | ICD-10-CM

## 2014-01-11 DIAGNOSIS — R0601 Orthopnea: Secondary | ICD-10-CM | POA: Diagnosis present

## 2014-01-11 DIAGNOSIS — I2510 Atherosclerotic heart disease of native coronary artery without angina pectoris: Secondary | ICD-10-CM | POA: Diagnosis present

## 2014-01-11 DIAGNOSIS — I5032 Chronic diastolic (congestive) heart failure: Secondary | ICD-10-CM | POA: Diagnosis present

## 2014-01-11 DIAGNOSIS — N390 Urinary tract infection, site not specified: Secondary | ICD-10-CM | POA: Diagnosis present

## 2014-01-11 DIAGNOSIS — E1169 Type 2 diabetes mellitus with other specified complication: Secondary | ICD-10-CM

## 2014-01-11 DIAGNOSIS — I252 Old myocardial infarction: Secondary | ICD-10-CM

## 2014-01-11 DIAGNOSIS — I5033 Acute on chronic diastolic (congestive) heart failure: Secondary | ICD-10-CM | POA: Diagnosis present

## 2014-01-11 DIAGNOSIS — I509 Heart failure, unspecified: Secondary | ICD-10-CM | POA: Insufficient documentation

## 2014-01-11 DIAGNOSIS — N189 Chronic kidney disease, unspecified: Principal | ICD-10-CM

## 2014-01-11 DIAGNOSIS — I639 Cerebral infarction, unspecified: Secondary | ICD-10-CM

## 2014-01-11 DIAGNOSIS — E1129 Type 2 diabetes mellitus with other diabetic kidney complication: Secondary | ICD-10-CM | POA: Diagnosis present

## 2014-01-11 DIAGNOSIS — I739 Peripheral vascular disease, unspecified: Secondary | ICD-10-CM | POA: Diagnosis present

## 2014-01-11 DIAGNOSIS — N058 Unspecified nephritic syndrome with other morphologic changes: Secondary | ICD-10-CM | POA: Diagnosis present

## 2014-01-11 DIAGNOSIS — Z79899 Other long term (current) drug therapy: Secondary | ICD-10-CM

## 2014-01-11 DIAGNOSIS — IMO0002 Reserved for concepts with insufficient information to code with codable children: Secondary | ICD-10-CM | POA: Diagnosis present

## 2014-01-11 DIAGNOSIS — R29898 Other symptoms and signs involving the musculoskeletal system: Secondary | ICD-10-CM | POA: Diagnosis present

## 2014-01-11 DIAGNOSIS — I130 Hypertensive heart and chronic kidney disease with heart failure and stage 1 through stage 4 chronic kidney disease, or unspecified chronic kidney disease: Principal | ICD-10-CM | POA: Diagnosis present

## 2014-01-11 DIAGNOSIS — E1142 Type 2 diabetes mellitus with diabetic polyneuropathy: Secondary | ICD-10-CM | POA: Diagnosis present

## 2014-01-11 DIAGNOSIS — I69998 Other sequelae following unspecified cerebrovascular disease: Secondary | ICD-10-CM

## 2014-01-11 DIAGNOSIS — Z6841 Body Mass Index (BMI) 40.0 and over, adult: Secondary | ICD-10-CM

## 2014-01-11 DIAGNOSIS — D631 Anemia in chronic kidney disease: Secondary | ICD-10-CM

## 2014-01-11 DIAGNOSIS — J9620 Acute and chronic respiratory failure, unspecified whether with hypoxia or hypercapnia: Secondary | ICD-10-CM | POA: Diagnosis present

## 2014-01-11 DIAGNOSIS — Z794 Long term (current) use of insulin: Secondary | ICD-10-CM

## 2014-01-11 LAB — URINE MICROSCOPIC-ADD ON

## 2014-01-11 LAB — URINALYSIS, ROUTINE W REFLEX MICROSCOPIC
Bilirubin Urine: NEGATIVE
Glucose, UA: 250 mg/dL — AB
KETONES UR: NEGATIVE mg/dL
NITRITE: NEGATIVE
PH: 6.5 (ref 5.0–8.0)
Specific Gravity, Urine: 1.015 (ref 1.005–1.030)
Urobilinogen, UA: 0.2 mg/dL (ref 0.0–1.0)

## 2014-01-11 LAB — COMPREHENSIVE METABOLIC PANEL
ALBUMIN: 2.9 g/dL — AB (ref 3.5–5.2)
ALT: 13 U/L (ref 0–35)
AST: 26 U/L (ref 0–37)
Alkaline Phosphatase: 126 U/L — ABNORMAL HIGH (ref 39–117)
BILIRUBIN TOTAL: 0.2 mg/dL — AB (ref 0.3–1.2)
BUN: 43 mg/dL — AB (ref 6–23)
CHLORIDE: 109 meq/L (ref 96–112)
CO2: 20 mEq/L (ref 19–32)
CREATININE: 1.85 mg/dL — AB (ref 0.50–1.10)
Calcium: 9.3 mg/dL (ref 8.4–10.5)
GFR calc Af Amer: 33 mL/min — ABNORMAL LOW (ref >90)
GFR, EST NON AFRICAN AMERICAN: 28 mL/min — AB (ref >90)
Glucose, Bld: 162 mg/dL — ABNORMAL HIGH (ref 70–99)
Potassium: 5 mEq/L (ref 3.7–5.3)
Sodium: 144 mEq/L (ref 137–147)
Total Protein: 7.9 g/dL (ref 6.0–8.3)

## 2014-01-11 LAB — CBC
HCT: 33.7 % — ABNORMAL LOW (ref 36.0–46.0)
Hemoglobin: 10.7 g/dL — ABNORMAL LOW (ref 12.0–15.0)
MCH: 27.9 pg (ref 26.0–34.0)
MCHC: 31.8 g/dL (ref 30.0–36.0)
MCV: 87.8 fL (ref 78.0–100.0)
Platelets: 216 10*3/uL (ref 150–400)
RBC: 3.84 MIL/uL — AB (ref 3.87–5.11)
RDW: 16.4 % — AB (ref 11.5–15.5)
WBC: 6.8 10*3/uL (ref 4.0–10.5)

## 2014-01-11 LAB — TROPONIN I: Troponin I: <0.3 ng/mL (ref <0.30)

## 2014-01-11 LAB — PRO B NATRIURETIC PEPTIDE: Pro B Natriuretic peptide (BNP): 1310 pg/mL — ABNORMAL HIGH (ref 0–125)

## 2014-01-11 LAB — GLUCOSE, CAPILLARY
GLUCOSE-CAPILLARY: 204 mg/dL — AB (ref 70–99)
Glucose-Capillary: 98 mg/dL (ref 70–99)

## 2014-01-11 MED ORDER — ACETAMINOPHEN 325 MG PO TABS: 650.0000 mg | ORAL_TABLET | Freq: Four times a day (QID) | ORAL | Status: DC | PRN

## 2014-01-11 MED ORDER — DEXTROSE 5 % IV SOLN
1.0000 g | Freq: Once | INTRAVENOUS | Status: AC
  Administered 2014-01-11: 1 g via INTRAVENOUS
  Filled 2014-01-11: qty 10

## 2014-01-11 MED ORDER — CARVEDILOL 25 MG PO TABS
25.0000 mg | ORAL_TABLET | Freq: Two times a day (BID) | ORAL | Status: DC
  Administered 2014-01-11 – 2014-01-15 (×8): 25 mg via ORAL
  Filled 2014-01-11 (×10): qty 1

## 2014-01-11 MED ORDER — ONDANSETRON HCL 4 MG PO TABS: 4.0000 mg | ORAL_TABLET | Freq: Four times a day (QID) | ORAL | Status: DC | PRN

## 2014-01-11 MED ORDER — ONDANSETRON HCL 4 MG/2ML IJ SOLN: 4.0000 mg | Freq: Four times a day (QID) | INTRAMUSCULAR | Status: DC | PRN

## 2014-01-11 MED ORDER — SODIUM CHLORIDE 0.9 % IJ SOLN
3.0000 mL | Freq: Two times a day (BID) | INTRAMUSCULAR | Status: DC
  Administered 2014-01-12 – 2014-01-15 (×6): 3 mL via INTRAVENOUS

## 2014-01-11 MED ORDER — ALBUTEROL SULFATE (2.5 MG/3ML) 0.083% IN NEBU: 2.5000 mg | INHALATION_SOLUTION | RESPIRATORY_TRACT | Status: AC | PRN

## 2014-01-11 MED ORDER — LEVOFLOXACIN IN D5W 750 MG/150ML IV SOLN
750.0000 mg | INTRAVENOUS | Status: DC
  Administered 2014-01-11: 750 mg via INTRAVENOUS
  Filled 2014-01-11: qty 150

## 2014-01-11 MED ORDER — GABAPENTIN 100 MG PO CAPS
100.0000 mg | ORAL_CAPSULE | Freq: Two times a day (BID) | ORAL | Status: DC
  Administered 2014-01-11 – 2014-01-15 (×8): 100 mg via ORAL
  Filled 2014-01-11 (×9): qty 1

## 2014-01-11 MED ORDER — FERROUS SULFATE 325 (65 FE) MG PO TABS
325.0000 mg | ORAL_TABLET | Freq: Every day | ORAL | Status: DC
  Administered 2014-01-12 – 2014-01-15 (×4): 325 mg via ORAL
  Filled 2014-01-11 (×5): qty 1

## 2014-01-11 MED ORDER — ATORVASTATIN CALCIUM 40 MG PO TABS
40.0000 mg | ORAL_TABLET | Freq: Every day | ORAL | Status: DC
  Administered 2014-01-11 – 2014-01-14 (×4): 40 mg via ORAL
  Filled 2014-01-11 (×5): qty 1

## 2014-01-11 MED ORDER — FUROSEMIDE 10 MG/ML IJ SOLN
60.0000 mg | Freq: Two times a day (BID) | INTRAMUSCULAR | Status: DC
  Administered 2014-01-11: 60 mg via INTRAVENOUS
  Filled 2014-01-11 (×3): qty 6

## 2014-01-11 MED ORDER — ACETAMINOPHEN 650 MG RE SUPP: 650.0000 mg | Freq: Four times a day (QID) | RECTAL | Status: DC | PRN

## 2014-01-11 MED ORDER — INSULIN ASPART 100 UNIT/ML ~~LOC~~ SOLN
0.0000 [IU] | Freq: Three times a day (TID) | SUBCUTANEOUS | Status: DC
  Administered 2014-01-12: 1 [IU] via SUBCUTANEOUS

## 2014-01-11 MED ORDER — INSULIN GLARGINE 100 UNIT/ML ~~LOC~~ SOLN
30.0000 [IU] | Freq: Every day | SUBCUTANEOUS | Status: DC
  Administered 2014-01-11: 30 [IU] via SUBCUTANEOUS
  Filled 2014-01-11 (×2): qty 0.3

## 2014-01-11 MED ORDER — FUROSEMIDE 10 MG/ML IJ SOLN
40.0000 mg | Freq: Once | INTRAMUSCULAR | Status: AC
  Administered 2014-01-11: 40 mg via INTRAVENOUS
  Filled 2014-01-11: qty 4

## 2014-01-11 MED ORDER — OMEGA-3-ACID ETHYL ESTERS 1 G PO CAPS
1.0000 g | ORAL_CAPSULE | Freq: Three times a day (TID) | ORAL | Status: DC
  Administered 2014-01-11 – 2014-01-15 (×11): 1 g via ORAL
  Filled 2014-01-11 (×13): qty 1

## 2014-01-11 MED ORDER — ASPIRIN 325 MG PO TABS
325.0000 mg | ORAL_TABLET | Freq: Every day | ORAL | Status: DC
  Administered 2014-01-12 – 2014-01-15 (×4): 325 mg via ORAL
  Filled 2014-01-11 (×4): qty 1

## 2014-01-11 MED ORDER — BUPROPION HCL ER (XL) 150 MG PO TB24
150.0000 mg | ORAL_TABLET | Freq: Every day | ORAL | Status: DC
  Administered 2014-01-12 – 2014-01-15 (×4): 150 mg via ORAL
  Filled 2014-01-11 (×4): qty 1

## 2014-01-11 MED ORDER — HEPARIN SODIUM (PORCINE) 5000 UNIT/ML IJ SOLN
5000.0000 [IU] | Freq: Three times a day (TID) | INTRAMUSCULAR | Status: DC
  Administered 2014-01-11 – 2014-01-15 (×11): 5000 [IU] via SUBCUTANEOUS
  Filled 2014-01-11 (×14): qty 1

## 2014-01-11 NOTE — Progress Notes (Signed)
ANTIBIOTIC CONSULT NOTE - INITIAL  Pharmacy Consult for Levaquin Indication:  R/O UTI  No Known Allergies  Patient Measurements: Height: 5\' 2"  (157.5 cm) Weight: 254 lb 6.6 oz (115.4 kg) IBW/kg (Calculated) : 50.1  Vital Signs: Temp: 98.9 F (37.2 C) (02/23 1102) Temp src: Oral (02/23 1102) BP: 150/57 mmHg (02/23 1600) Pulse Rate: 63 (02/23 1600)  Labs:  Recent Labs  01/11/14 1139  WBC 6.8  HGB 10.7*  PLT 216  CREATININE 1.85*   Estimated Creatinine Clearance: 37.9 ml/min (by C-G formula based on Cr of 1.85).  Microbiology: No results found for this or any previous visit (from the past 720 hour(s)).  Medical History: Past Medical History  Diagnosis Date  . ST elevation myocardial infarction (STEMI) of inferior wall  03/2009    Ostial/proximal RCA occlusion treated with BMS stent  . CAD (coronary artery disease), native coronary artery      status post PCI to the RCA for inferior STEMI  . Cancer     endo ca  . Hypertension associated with diabetes   . Hyperlipidemia LDL goal <70   . Diabetes mellitus, type II, insulin dependent     With complications - CAD, CVA, peripheral ulcer ,  . Stroke/cerebrovascular accident  4/30/ 2014    Right-sided weakness, mostly with balance issues and only mild weakness.  . Diabetic peripheral neuropathy associated with type 2 diabetes mellitus   . Obesity, Class III, BMI 40-49.9 (morbid obesity)     BMI 43; 5' 2',  235 pounds 6.4 ounces  . CKD (chronic kidney disease), stage IV     Recent acute on chronic exacerbation in July 2014  . PAD (peripheral artery disease)   . Diabetic foot ulcer   . Dry gangrene     Left second toe; now on Sole of L foot   Medications:  Anti-infectives   Start     Dose/Rate Route Frequency Ordered Stop   01/11/14 1445  cefTRIAXone (ROCEPHIN) 1 g in dextrose 5 % 50 mL IVPB     1 g 100 mL/hr over 30 Minutes Intravenous  Once 01/11/14 1430 01/11/14 1529     Assessment: 66 yof admitted with SOB,  leg edema and now with possible UTI.  She has received one dose of IV ceftriaxone and we have been asked to dose her levofloxacin.  She has some renal insufficiency with a creatinine of 1.85 and an estimated clearance of ~ 38 ml/min.  She has no known allergies.  Her urinalysis reveals a cloudy sample with small amount of leukocytes.  She has no leukocytosis and is afebrile.   She is morbidly obese with multiple co-morbidities.  We will use a renal adjusted dose for her and evaluate response.  Goal of Therapy:  Therapeutic response to IV antibiotics  Plan:  - Levaquin 750mg  IV every 48 hours - Check culture data, renal function and clinical response - Change to oral therapy after 48-72 hours for course completion  Rober Minion, PharmD., MS Clinical Pharmacist Pager:  [phone] Thank you for allowing pharmacy to be part of this patients care team. 01/11/2014,5:20 PM

## 2014-01-11 NOTE — ED Notes (Signed)
RN picked i-stat troponin from mini lab prior to being tested and sent to main lab for Troponin testing.

## 2014-01-11 NOTE — ED Notes (Signed)
P[t c/o shortness of breath while at therapy today. Pulse ox at therapy was 90% on room air. Pt is stage 4 kidney diease but does not do dialysis.

## 2014-01-11 NOTE — ED Notes (Signed)
Patient and family updated on plan of care, and UTI. Pt sts does not walk at home, get worn out by moving in bed and sitting in wheelchair. RN allowed pt to move self up in bed and turn for bedpan on own. Pt did with no difficulty SpO2 sats maintained above 90%, mainly between 97-100% on RA

## 2014-01-11 NOTE — ED Notes (Signed)
Admitting at bedside 

## 2014-01-11 NOTE — H&P (Signed)
[facility] History and Physical  [name] [id number] DOB: 1950/11/23 DOA: 01/11/2014  Referring physician:  PCP: [name]., MD  Specialists:   Chief Complaint: SOB, leg edema   HPI: [name] is a 63 y.o. female with PMH of HTN, HPL, PAD s/p L BKA, IDDM, CAD, CKD, CHF presented with progressive SOB, orthopnea, LE edema found to have CHF with pulmonary edema, mild hypoxia, improved on IV lasix, oxygen provided in ED; denies chest pain, no nausea, vomiting, or diarrhea, no focal neuro symptoms, no fever  Review of Systems: The patient denies anorexia, fever, weight loss,, vision loss, decreased hearing, hoarseness, chest pain, syncope, dyspnea on exertion, peripheral edema, balance deficits, hemoptysis, abdominal pain, melena, hematochezia, severe indigestion/heartburn, hematuria, incontinence, genital sores, muscle weakness, suspicious skin lesions, transient blindness, difficulty walking, depression, unusual weight change, abnormal bleeding, enlarged lymph nodes, angioedema, and breast masses.    Past Medical History  Diagnosis Date  . ST elevation myocardial infarction (STEMI) of inferior wall  03/2009    Ostial/proximal RCA occlusion treated with BMS stent  . CAD (coronary artery disease), native coronary artery      status post PCI to the RCA for inferior STEMI  . Cancer     endo ca  . Hypertension associated with diabetes   . Hyperlipidemia LDL goal <70   . Diabetes mellitus, type II, insulin dependent     With complications - CAD, CVA, peripheral ulcer ,  . Stroke/cerebrovascular accident  4/30/ 2014    Right-sided weakness, mostly with balance issues and only mild weakness.  . Diabetic peripheral neuropathy associated with type 2 diabetes mellitus   . Obesity, Class III, BMI 40-49.9 (morbid obesity)     BMI 43; 5' 2',  235 pounds 6.4 ounces  . CKD (chronic kidney disease), stage IV     Recent acute on chronic exacerbation in July 2014  . PAD  (peripheral artery disease)   . Diabetic foot ulcer   . Dry gangrene     Left second toe; now on Sole of L foot   Past Surgical History  Procedure Laterality Date  . Abdominal hysterectomy    . Leg tendon surgery      patient fell in a store right leg  . Leg surgery      hole in bone( during Childhood)  . Cardiac catheterization  03/31/2009    Proximal RCA thrombotic occlusion (inferior STEMI) other coronaries but in a codominant system  . Coronary angioplasty  03/31/2009    PTCA , bare-metal stent 3.5 mm x 24 mm ostium of RCA ,EF 55%  . Amputation Left 08/07/2013    Procedure: left midfoot amputation;  Surgeon: Marianna Payment, MD;  Location: [facility];  Service: Orthopedics;  Laterality: Left;  . Amputation Left 08/09/2013    Procedure: AMPUTATION BELOW KNEE;  Surgeon: Marianna Payment, MD;  Location: [facility];  Service: Orthopedics;  Laterality: Left;   Social History:  reports that she quit smoking about 18 years ago. She has never used smokeless tobacco. She reports that she does not drink alcohol or use illicit drugs. Home;  where does patient live--home, ALF, SNF? and with whom if at home? No;  Can patient participate in ADLs?  No Known Allergies  Family History  Problem Relation Age of Onset  . Alcoholism Mother     Died from complications  . Alcoholism Father     Died from complications  . Heart disease      Unknown, unclear. Patient is  not a good historian.  Essentially no pertinent history known    (be sure to complete)  Prior to Admission medications   Medication Sig Start Date End Date Taking? Authorizing Provider  aspirin 325 MG tablet Take 325 mg by mouth daily. Take 30 minutes after taking  500 cr niaspan   Yes Historical Provider, MD  atorvastatin (LIPITOR) 40 MG tablet Take 1 tablet (40 mg total) by mouth daily at 6 PM. 03/23/13  Yes Geradine Girt, DO  buPROPion (WELLBUTRIN XL) 150 MG 24 hr tablet Take 150 mg by mouth daily.   Yes Historical Provider, MD   carvedilol (COREG) 25 MG tablet Take 25 mg by mouth 2 (two) times daily with a meal.   Yes Historical Provider, MD  ferrous sulfate 325 (65 FE) MG tablet Take 325 mg by mouth daily with breakfast.   Yes Historical Provider, MD  furosemide (LASIX) 40 MG tablet Take 1 tablet (40 mg total) by mouth daily. 08/14/13  Yes Shanker Kristeen Mans, MD  gabapentin (NEURONTIN) 100 MG capsule Take 1 capsule (100 mg total) by mouth 2 (two) times daily. 08/14/13  Yes Shanker Kristeen Mans, MD  insulin aspart (NOVOLOG) 100 UNIT/ML injection Inject 0-15 Units into the skin 3 (three) times daily with meals. Sliding scale insulin CBG 70 - 120: 0 units: CBG 121 - 150: 2 units; CBG 151 - 200: 3 units; CBG 201 - 250: 5 units; CBG 251 - 300: 8 units;CBG 301 - 350: 11 units; CBG 351 - 400: 15 units; CBG > 400 : 15 units and notify your doctor 06/09/13  Yes Ripudeep Krystal Eaton, MD  insulin glargine (LANTUS) 100 UNIT/ML injection Inject 30 Units into the skin at bedtime.   Yes Historical Provider, MD  omega-3 acid ethyl esters (LOVAZA) 1 G capsule Take 1 g by mouth 3 (three) times daily.    Yes Historical Provider, MD   Physical Exam: Filed Vitals:   01/11/14 1535  BP: 128/67  Pulse:   Temp:   Resp: 19     General:  alert  Eyes: eom-i, perrla   ENT: no oral ulcers  Neck: supple, + JVD  Cardiovascular: s1,s2 rrr  Respiratory: LL crackles  Abdomen: soft, obese, nt  Skin: no rash  Musculoskeletal: R LE edema, L BKA  Psychiatric: no hallucinatiopns  Neurologic: cn 2-12 intact, motor 5/5 BL UE  Labs on Admission:  Basic Metabolic Panel:  Recent Labs Lab 01/11/14 1139  NA 144  K 5.0  CL 109  CO2 20  GLUCOSE 162*  BUN 43*  CREATININE 1.85*  CALCIUM 9.3   Liver Function Tests:  Recent Labs Lab 01/11/14 1139  AST 26  ALT 13  ALKPHOS 126*  BILITOT 0.2*  PROT 7.9  ALBUMIN 2.9*   No results found for this basename: LIPASE, AMYLASE,  in the last 168 hours No results found for this basename: AMMONIA,   in the last 168 hours CBC:  Recent Labs Lab 01/11/14 1139  WBC 6.8  HGB 10.7*  HCT 33.7*  MCV 87.8  PLT 216   Cardiac Enzymes:  Recent Labs Lab 01/11/14 1139  TROPONINI <0.30    BNP (last 3 results)  Recent Labs  01/11/14 1139  PROBNP 1310.0*   CBG: No results found for this basename: GLUCAP,  in the last 168 hours  Radiological Exams on Admission: Dg Chest 2 View  01/11/2014   CLINICAL DATA:  cough, SOB  EXAM: CHEST  2 VIEW  COMPARISON:  DG CHEST 1V  PORT dated 08/11/2013  FINDINGS: There is diffuse prominence of the interstitial markings is well as areas of peribronchial cuffing. Increased density projects within the right lung base. The cardiac silhouette is enlarged. The osseous structures unremarkable. Evaluation is limited secondary to patient body habitus. Fluid versus thickening is identified along the minor fissure on the right.  IMPRESSION: Interstitial infiltrate.  Likely reflecting pulmonary edema.  Atelectasis versus infiltrate right lung base.   Electronically Signed   By: Margaree Mackintosh M.D.   On: 01/11/2014 13:23    EKG: Independently reviewed. Nsr, no acute st changes   Assessment/Plan Principal Problem:   Acute on chronic diastolic CHF (congestive heart failure), NYHA class 3 Active Problems:   UTI (lower urinary tract infection)   Type II or unspecified type diabetes mellitus with renal manifestations, uncontrolled   CAD (coronary artery disease), native coronary artery   Obesity, Class III, BMI 40-49.9 (morbid obesity)   Peripheral arterial disease   Chronic kidney disease (CKD), stage IV (severe)  63 y.o. female with PMH of HTN, HPL, PAD s/p L BKA, IDDM, CAD, CKD, CHF presented with progressive SOB, orthopnea, LE edema found to have CHF with pulmonary edema, mild hypoxia, improved on IV lasix, oxygen provided in ED  1. Acute on chronic CHF likely diastolic HF worse with underlying CKD -CXR: pulmonary edema  -cont IV lasix 60 BID, expect worsening  renal function/monitor, daily weight, I/O; not on ACE  2. UTI, started on IV atx, f/yu cultures   3. IDDM, HA1c-10 (05/2013) -resume insulin regimen, recheck a1c  4. CAD PTCA R RCA (2010); no acute symptoms -cont home regimen BB, ASA, statin   5. CKD IV; h/o HD x 2 last year with CHF; ? Baseline Cr 2.5-3.0 -monitor on diuresis   6. HTN uncontrolled on admit, improving -resume titrate homemeds;   None;  if consultant consulted, please document name and whether formally or informally consulted  Code Status: full (must indicate code status--if unknown or must be presumed, indicate so) Family Communication: d/w aptient, her daughter (indicate person spoken with, if applicable, with phone number if by telephone) Disposition Plan: home in 2-3 days  (indicate anticipated LOS)  Time spent: >35 minutes   Kinnie Feil [facility] Pager [phone]  If 7PM-7AM, please contact night-coverage [email or web address] Password [facility] 01/11/2014, 3:56 PM

## 2014-01-11 NOTE — ED Provider Notes (Signed)
Medical screening examination/treatment/procedure(s) were performed by non-physician practitioner and as supervising physician I was immediately available for consultation/collaboration.      Maudry Diego, MD 01/11/14 [id number]

## 2014-01-11 NOTE — Progress Notes (Signed)
Patient arrived to the floor via stretcher from the ED.  Placed on telemetry and verified with CCMD.

## 2014-01-11 NOTE — ED Notes (Signed)
Meal ordered

## 2014-01-11 NOTE — ED Provider Notes (Signed)
CSN: [id number]     Arrival date & time 01/11/14  1043 History   First MD Initiated Contact with Patient 01/11/14 1110     Chief Complaint  Patient presents with  . Shortness of Breath     (Consider location/radiation/quality/duration/timing/severity/associated sxs/prior Treatment) The history is provided by the patient and medical records. No language interpreter was used.    [name] is a 63 y.o. female  with a hx of MI, CAD, HTN, IDDM, diabetic peripheral neuropathy, CKD (not on dialysis), PAD presents to the Emergency Department complaining of gradual, persistent, progressively worsening SOB onset while at physical therapy. Family reports that her SPO2 dropped to 90% while there today.  She reports some orthopnea and slowly increased DOE for the past several weeks.  Family also relates that her right leg has been swelling. Pt reports she takes her lasix 40mg  QD as directed.  CBG has been in the mid to high 100s.  Associated symptoms include decreased activity due to her SOB.  She takes no medications for breathing.  Nothing makes it better and exertion makes it worse.  Pt denies fever, chills, headache, neck pain, CP, abd pain, N/V/D, syncope, dysuria.     Past Medical History  Diagnosis Date  . ST elevation myocardial infarction (STEMI) of inferior wall  03/2009    Ostial/proximal RCA occlusion treated with BMS stent  . CAD (coronary artery disease), native coronary artery      status post PCI to the RCA for inferior STEMI  . Cancer     endo ca  . Hypertension associated with diabetes   . Hyperlipidemia LDL goal <70   . Diabetes mellitus, type II, insulin dependent     With complications - CAD, CVA, peripheral ulcer ,  . Stroke/cerebrovascular accident  4/30/ 2014    Right-sided weakness, mostly with balance issues and only mild weakness.  . Diabetic peripheral neuropathy associated with type 2 diabetes mellitus   . Obesity, Class III, BMI 40-49.9 (morbid obesity)     BMI 43;  5' 2',  235 pounds 6.4 ounces  . CKD (chronic kidney disease), stage IV     Recent acute on chronic exacerbation in July 2014  . PAD (peripheral artery disease)   . Diabetic foot ulcer   . Dry gangrene     Left second toe; now on Sole of L foot   Past Surgical History  Procedure Laterality Date  . Abdominal hysterectomy    . Leg tendon surgery      patient fell in a store right leg  . Leg surgery      hole in bone( during Childhood)  . Cardiac catheterization  03/31/2009    Proximal RCA thrombotic occlusion (inferior STEMI) other coronaries but in a codominant system  . Coronary angioplasty  03/31/2009    PTCA , bare-metal stent 3.5 mm x 24 mm ostium of RCA ,EF 55%  . Amputation Left 08/07/2013    Procedure: left midfoot amputation;  Surgeon: Marianna Payment, MD;  Location: [facility];  Service: Orthopedics;  Laterality: Left;  . Amputation Left 08/09/2013    Procedure: AMPUTATION BELOW KNEE;  Surgeon: Marianna Payment, MD;  Location: [facility];  Service: Orthopedics;  Laterality: Left;   Family History  Problem Relation Age of Onset  . Alcoholism Mother     Died from complications  . Alcoholism Father     Died from complications  . Heart disease      Unknown, unclear. Patient is not a  good historian.  Essentially no pertinent history known   History  Substance Use Topics  . Smoking status: Former Smoker    Quit date: 04/29/1995  . Smokeless tobacco: Never Used  . Alcohol Use: No   OB History   Grav Para Term Preterm Abortions TAB SAB Ect Mult Living                 Review of Systems  Constitutional: Negative for fever, diaphoresis, appetite change, fatigue and unexpected weight change.  HENT: Negative for mouth sores.   Eyes: Negative for visual disturbance.  Respiratory: Positive for shortness of breath. Negative for cough, chest tightness and wheezing.   Cardiovascular: Positive for leg swelling. Negative for chest pain.  Gastrointestinal: Negative for nausea,  vomiting, abdominal pain, diarrhea and constipation.  Endocrine: Negative for polydipsia, polyphagia and polyuria.  Genitourinary: Negative for dysuria, urgency, frequency and hematuria.  Musculoskeletal: Negative for back pain and neck stiffness.  Skin: Negative for rash.  Allergic/Immunologic: Negative for immunocompromised state.  Neurological: Negative for syncope, light-headedness and headaches.  Hematological: Does not bruise/bleed easily.  Psychiatric/Behavioral: Negative for sleep disturbance. The patient is not nervous/anxious.       Allergies  Review of patient's allergies indicates no known allergies.  Home Medications   Current Outpatient Rx  Name  Route  Sig  Dispense  Refill  . aspirin 325 MG tablet   Oral   Take 325 mg by mouth daily. Take 30 minutes after taking  500 cr niaspan         . atorvastatin (LIPITOR) 40 MG tablet   Oral   Take 1 tablet (40 mg total) by mouth daily at 6 PM.         . buPROPion (WELLBUTRIN XL) 150 MG 24 hr tablet   Oral   Take 150 mg by mouth daily.         . carvedilol (COREG) 25 MG tablet   Oral   Take 25 mg by mouth 2 (two) times daily with a meal.         . ferrous sulfate 325 (65 FE) MG tablet   Oral   Take 325 mg by mouth daily with breakfast.         . furosemide (LASIX) 40 MG tablet   Oral   Take 1 tablet (40 mg total) by mouth daily.   60 tablet   3   . gabapentin (NEURONTIN) 100 MG capsule   Oral   Take 1 capsule (100 mg total) by mouth 2 (two) times daily.   60 capsule      . insulin aspart (NOVOLOG) 100 UNIT/ML injection   Subcutaneous   Inject 0-15 Units into the skin 3 (three) times daily with meals. Sliding scale insulin CBG 70 - 120: 0 units: CBG 121 - 150: 2 units; CBG 151 - 200: 3 units; CBG 201 - 250: 5 units; CBG 251 - 300: 8 units;CBG 301 - 350: 11 units; CBG 351 - 400: 15 units; CBG > 400 : 15 units and notify your doctor   1 vial   12   . insulin glargine (LANTUS) 100 UNIT/ML  injection   Subcutaneous   Inject 30 Units into the skin at bedtime.         Marland Kitchen omega-3 acid ethyl esters (LOVAZA) 1 G capsule   Oral   Take 1 g by mouth 3 (three) times daily.           BP 128/67  Pulse 63  Temp(Src) 98.9 F (37.2 C) (Oral)  Resp 19  Ht 5\' 2"  (1.575 m)  Wt 254 lb (115.214 kg)  BMI 46.45 kg/m2  SpO2 94% Physical Exam  Nursing note and vitals reviewed. Constitutional: She is oriented to person, place, and time. She appears well-developed and well-nourished. No distress.  Awake, alert, nontoxic appearance  HENT:  Head: Normocephalic and atraumatic.  Mouth/Throat: Oropharynx is clear and moist. No oropharyngeal exudate.  Eyes: Conjunctivae are normal. No scleral icterus.  Neck: Normal range of motion. Neck supple.  Cardiovascular: Normal rate, regular rhythm, normal heart sounds and intact distal pulses.   No murmur heard. Regular rate and rhythm  Pulmonary/Chest: Accessory muscle usage present. Tachypnea noted. No respiratory distress. She has decreased breath sounds. She has no wheezes. She has no rhonchi. She has no rales. She exhibits no tenderness.  Decreased breath sounds throughout Accessory muscle usage at rest and mild tachypnea Patient oxygen saturation 91% at rest on room air  Abdominal: Soft. Bowel sounds are normal. She exhibits no mass. There is no tenderness. There is no rebound and no guarding.  Obese Soft and nontender  Musculoskeletal: Normal range of motion. She exhibits edema.  Left BKA 1+ edema of the right lower extremity  Lymphadenopathy:    She has no cervical adenopathy.  Neurological: She is alert and oriented to person, place, and time. She exhibits normal muscle tone. Coordination normal.  Speech is clear and goal oriented Moves extremities without ataxia  Skin: Skin is warm and dry. No rash noted. She is not diaphoretic. No erythema.  Psychiatric: She has a normal mood and affect.    ED Course  Procedures (including  critical care time) Labs Review Labs Reviewed  CBC - Abnormal; Notable for the following:    RBC 3.84 (*)    Hemoglobin 10.7 (*)    HCT 33.7 (*)    RDW 16.4 (*)    All other components within normal limits  PRO B NATRIURETIC PEPTIDE - Abnormal; Notable for the following:    Pro B Natriuretic peptide (BNP) 1310.0 (*)    All other components within normal limits  COMPREHENSIVE METABOLIC PANEL - Abnormal; Notable for the following:    Glucose, Bld 162 (*)    BUN 43 (*)    Creatinine, Ser 1.85 (*)    Albumin 2.9 (*)    Alkaline Phosphatase 126 (*)    Total Bilirubin 0.2 (*)    GFR calc non Af Amer 28 (*)    GFR calc Af Amer 33 (*)    All other components within normal limits  URINALYSIS, ROUTINE W REFLEX MICROSCOPIC - Abnormal; Notable for the following:    APPearance CLOUDY (*)    Glucose, UA 250 (*)    Hgb urine dipstick MODERATE (*)    Protein, ur >300 (*)    Leukocytes, UA SMALL (*)    All other components within normal limits  URINE MICROSCOPIC-ADD ON - Abnormal; Notable for the following:    Bacteria, UA FEW (*)    All other components within normal limits  TROPONIN I  I-STAT TROPOININ, ED   Imaging Review Dg Chest 2 View  01/11/2014   CLINICAL DATA:  cough, SOB  EXAM: CHEST  2 VIEW  COMPARISON:  DG CHEST 1V PORT dated 08/11/2013  FINDINGS: There is diffuse prominence of the interstitial markings is well as areas of peribronchial cuffing. Increased density projects within the right lung base. The cardiac silhouette is enlarged. The osseous structures unremarkable. Evaluation is limited  secondary to patient body habitus. Fluid versus thickening is identified along the minor fissure on the right.  IMPRESSION: Interstitial infiltrate.  Likely reflecting pulmonary edema.  Atelectasis versus infiltrate right lung base.   Electronically Signed   By: Margaree Mackintosh M.D.   On: 01/11/2014 13:23    EKG Interpretation   None      ECG:  Date: 01/11/2014  Rate: 76  Rhythm: normal  sinus rhythm  QRS Axis: normal  Intervals: normal  ST/T Wave abnormalities: normal  Conduction Disutrbances:none  Narrative Interpretation: Nonischemic ECG, unchanged from 03/18/2013  Old EKG Reviewed: unchanged    MDM   Final diagnoses:  CHF (congestive heart failure)  UTI (lower urinary tract infection)  Anemia in chronic kidney disease(285.21)  Chronic kidney disease (CKD), stage IV (severe)  CAD (coronary artery disease), native coronary artery  Hypertension associated with diabetes   [name] presents with increasing shortness of breath and an episode of hypoxia at physical therapy today.  ECG nonischemic ECG, CBC with mild anemia at baseline. CMP with elevated BUN and creatinine at baseline and below concerning values only 2014.  Troponin negative. BNP elevated at 1310 chest x-ray with evidence of pulmonary edema.  You have evidence of urinary tract infection to  Urinary tract infection treated in the emergency department with Rocephin. Fluid overload and pulmonary edema treated with Lasix 40 mg IV.  Discussed with Dr. Daleen Bo with [facility] who will admit for fluid overload.  Vital signs stable here in the emergency department.  BP 128/67  Pulse 63  Temp(Src) 98.9 F (37.2 C) (Oral)  Resp 19  Ht 5\' 2"  (1.575 m)  Wt 254 lb (115.214 kg)  BMI 46.45 kg/m2  SpO2 94%  The patient was discussed with and seen by Dr. Roderic Palau who agrees with the treatment plan.    Jarrett Soho Anelisse Jacobson, PA-C 01/11/14 [phone]

## 2014-01-12 DIAGNOSIS — N179 Acute kidney failure, unspecified: Secondary | ICD-10-CM | POA: Diagnosis present

## 2014-01-12 DIAGNOSIS — I5033 Acute on chronic diastolic (congestive) heart failure: Secondary | ICD-10-CM | POA: Diagnosis present

## 2014-01-12 DIAGNOSIS — N189 Chronic kidney disease, unspecified: Secondary | ICD-10-CM

## 2014-01-12 DIAGNOSIS — D638 Anemia in other chronic diseases classified elsewhere: Secondary | ICD-10-CM

## 2014-01-12 DIAGNOSIS — I5032 Chronic diastolic (congestive) heart failure: Secondary | ICD-10-CM | POA: Diagnosis present

## 2014-01-12 LAB — GLUCOSE, CAPILLARY
GLUCOSE-CAPILLARY: 126 mg/dL — AB (ref 70–99)
GLUCOSE-CAPILLARY: 171 mg/dL — AB (ref 70–99)
Glucose-Capillary: 152 mg/dL — ABNORMAL HIGH (ref 70–99)
Glucose-Capillary: 141 mg/dL — ABNORMAL HIGH (ref 70–99)

## 2014-01-12 LAB — BASIC METABOLIC PANEL
BUN: 46 mg/dL — AB (ref 6–23)
CO2: 25 mEq/L (ref 19–32)
CREATININE: 2.13 mg/dL — AB (ref 0.50–1.10)
Calcium: 9.2 mg/dL (ref 8.4–10.5)
Chloride: 106 mEq/L (ref 96–112)
GFR calc Af Amer: 27 mL/min — ABNORMAL LOW (ref >90)
GFR calc non Af Amer: 24 mL/min — ABNORMAL LOW (ref >90)
GLUCOSE: 126 mg/dL — AB (ref 70–99)
POTASSIUM: 4.3 meq/L (ref 3.7–5.3)
Sodium: 145 mEq/L (ref 137–147)

## 2014-01-12 LAB — HEMOGLOBIN A1C
Hgb A1c MFr Bld: 7.4 % — ABNORMAL HIGH (ref <5.7)
Mean Plasma Glucose: 166 mg/dL — ABNORMAL HIGH (ref <117)

## 2014-01-12 LAB — SODIUM, URINE, RANDOM: Sodium, Ur: 117 mEq/L

## 2014-01-12 LAB — CREATININE, URINE, RANDOM: Creatinine, Urine: 29.25 mg/dL

## 2014-01-12 MED ORDER — INSULIN ASPART 100 UNIT/ML ~~LOC~~ SOLN
0.0000 [IU] | Freq: Three times a day (TID) | SUBCUTANEOUS | Status: DC
  Administered 2014-01-12: 1 [IU] via SUBCUTANEOUS
  Administered 2014-01-12 – 2014-01-13 (×2): 2 [IU] via SUBCUTANEOUS
  Administered 2014-01-13 (×2): 1 [IU] via SUBCUTANEOUS
  Administered 2014-01-14 (×2): 2 [IU] via SUBCUTANEOUS
  Administered 2014-01-14: 1 [IU] via SUBCUTANEOUS
  Administered 2014-01-15: 07:00:00 via SUBCUTANEOUS
  Administered 2014-01-15: 2 [IU] via SUBCUTANEOUS

## 2014-01-12 MED ORDER — PNEUMOCOCCAL VAC POLYVALENT 25 MCG/0.5ML IJ INJ
0.5000 mL | INJECTION | INTRAMUSCULAR | Status: AC
  Administered 2014-01-13: 0.5 mL via INTRAMUSCULAR
  Filled 2014-01-12: qty 0.5

## 2014-01-12 MED ORDER — INSULIN GLARGINE 100 UNIT/ML ~~LOC~~ SOLN
20.0000 [IU] | Freq: Every day | SUBCUTANEOUS | Status: DC
  Administered 2014-01-12 – 2014-01-14 (×3): 20 [IU] via SUBCUTANEOUS
  Filled 2014-01-12 (×4): qty 0.2

## 2014-01-12 MED ORDER — FUROSEMIDE 10 MG/ML IJ SOLN
8.0000 mg/h | INTRAVENOUS | Status: DC
  Administered 2014-01-12: 8 mg/h via INTRAVENOUS
  Filled 2014-01-12 (×2): qty 25

## 2014-01-12 MED ORDER — INSULIN ASPART 100 UNIT/ML ~~LOC~~ SOLN
3.0000 [IU] | Freq: Three times a day (TID) | SUBCUTANEOUS | Status: DC
  Administered 2014-01-12 – 2014-01-15 (×9): 3 [IU] via SUBCUTANEOUS

## 2014-01-12 MED ORDER — DEXTROSE 5 % IV SOLN
1.0000 g | INTRAVENOUS | Status: DC
  Administered 2014-01-12 – 2014-01-14 (×3): 1 g via INTRAVENOUS
  Filled 2014-01-12 (×3): qty 10

## 2014-01-12 NOTE — Care Management Note (Addendum)
    Page 1 of 1   01/14/2014     1:58:58 PM   CARE MANAGEMENT NOTE 01/14/2014  Patient:  [name],[name]   Account Number:  [id number]  Date Initiated:  01/12/2014  Documentation initiated by:  Mariann Laster  Subjective/Objective Assessment:   63 y.o. female with PMH of HTN, HPL, PAD s/p L BKA, IDDM, CAD, CKD, CHF presented with progressive SOB, orthopnea, LE edema found to have CHF with pulmonary edema, mild hypoxia     Action/Plan:   IV diureses//set up/restart OP Rehab   Anticipated DC Date:  01/15/2014   Anticipated DC Plan:  [place]  CM consult      PAC Choice  Resumption Of Svcs/PTA Provider   Choice offered to / List presented to:             Status of service:  In process, will continue to follow Medicare Important Message given?   (If response is "NO", the following Medicare IM given date fields will be blank) Date Medicare IM given:   Date Additional Medicare IM given:    Discharge Disposition:    Per UR Regulation:  Reviewed for med. necessity/level of care/duration of stay  If discussed at [place] of Stay Meetings, dates discussed:    Comments:  01/14/14 [place], RN, BSN, [place] [phone] Spoke with pt at bedside and daughter via telephone regarding discharge planning.  Pt already set up with [facility] [phone]) for Monday and Wednesday appointments.  NCM called to resume appointments, but office closed.  Will retry tomorrow prior to pt d/c home.

## 2014-01-12 NOTE — Progress Notes (Signed)
UR completed Cadience Bradfield K. Raja Liska, RN, BSN, MSHL, CCM  01/12/2014 10:12 AM

## 2014-01-12 NOTE — Progress Notes (Signed)
[facility] PROGRESS NOTE Interim History: 63 y.o. female with PMH of HTN, HPL, PAD s/p L BKA, IDDM, CAD, CKD, CHF presented with progressive SOB, orthopnea, LE edema found to have CHF with pulmonary edema, mild hypoxia,  Filed Weights   01/11/14 1102 01/11/14 1711  Weight: 115.214 kg (254 lb) 115.4 kg (254 lb 6.6 oz)        Intake/Output Summary (Last 24 hours) at 01/12/14 0813 Last data filed at 01/12/14 0000  Gross per 24 hour  Intake    720 ml  Output   1575 ml  Net   -855 ml     Assessment/Plan: Acute respiratory failure Acute on chronic diastolic CHF (congestive heart failure), NYHA class 3 - Estimated dry weight <230 lbs.  2.24.2015 254lb (115.2 kg). ? Cardiorenal syndrome. - Mild increase in Cr with diuresis. Check Urine Sodium & cr. - d/c Levaquin no fever no leukocytosis. - Change lasix to IV ggt, had +JVD and 3+ edema. - B-met I and strict I and O's.  UTI (lower urinary tract infection) - UC pending, got one dose of rocephin, no supra pubic tenderness. - cont   Chronic kidney disease (CKD), stage IV (severe): - baseline Cr 1.6-1.8. - monitor.  Obesity, Class III, BMI 40-49.9 (morbid obesity) - counseling, will need work up for OSA.  Type II or unspecified type diabetes mellitus with renal manifestations, uncontrolled - decrease insulin start SSI renal dose.    Code Status: full Family Communication: none  Disposition Plan: inpatinet   Consultants:  none  Procedures: ECHO 4.30.2014: estimated ejection fraction was in the range of 65% to 70%. Wall motion was normal; there were no regional wall motion abnormalities. Early diastolic septal annular tissue Doppler velocities Ea were abnormal  Antibiotics:  Rocephin 2.23.2015.  HPI/Subjective: No complains except for being up all night urinating. SOB improved.  Objective: Filed Vitals:   01/11/14 1711 01/11/14 1751 01/11/14 2044 01/12/14 0618  BP: 182/76 150/70 148/64 152/74  Pulse: 69  66 72 74  Temp: 97.4 F (36.3 C) 97.9 F (36.6 C) 98 F (36.7 C) 98.3 F (36.8 C)  TempSrc: Oral Oral Oral Oral  Resp: '20 18 18 17  ' Height: '5\' 2"'  (1.575 m)     Weight: 115.4 kg (254 lb 6.6 oz)     SpO2: 96% 97% 96% 95%     Exam:  General: Alert, awake, oriented x3, in no acute distress.  HEENT: No bruits, no goiter. + HJ reflex Heart: Regular rate and rhythm, 3+ edema lower ext. Lungs: Good air movement, clear to auscultation. Abdomen: Soft, nontender, nondistended, positive bowel sounds.    Data Reviewed: Basic Metabolic Panel:  Recent Labs Lab 01/11/14 1139 01/12/14 0505  NA 144 145  K 5.0 4.3  CL 109 106  CO2 20 25  GLUCOSE 162* 126*  BUN 43* 46*  CREATININE 1.85* 2.13*  CALCIUM 9.3 9.2   Liver Function Tests:  Recent Labs Lab 01/11/14 1139  AST 26  ALT 13  ALKPHOS 126*  BILITOT 0.2*  PROT 7.9  ALBUMIN 2.9*   No results found for this basename: LIPASE, AMYLASE,  in the last 168 hours No results found for this basename: AMMONIA,  in the last 168 hours CBC:  Recent Labs Lab 01/11/14 1139  WBC 6.8  HGB 10.7*  HCT 33.7*  MCV 87.8  PLT 216   Cardiac Enzymes:  Recent Labs Lab 01/11/14 1139  TROPONINI <0.30   BNP (last 3 results)  Recent Labs  01/11/14  [place] 1310.0*   CBG:  Recent Labs Lab 01/11/14 1653 01/11/14 2124 01/12/14 0620  GLUCAP 98 204* 126*    No results found for this or any previous visit (from the past 240 hour(s)).   Studies: Dg Chest 2 View  01/11/2014   CLINICAL DATA:  cough, SOB  EXAM: CHEST  2 VIEW  COMPARISON:  DG CHEST 1V PORT dated 08/11/2013  FINDINGS: There is diffuse prominence of the interstitial markings is well as areas of peribronchial cuffing. Increased density projects within the right lung base. The cardiac silhouette is enlarged. The osseous structures unremarkable. Evaluation is limited secondary to patient body habitus. Fluid versus thickening is identified along the minor fissure on the  right.  IMPRESSION: Interstitial infiltrate.  Likely reflecting pulmonary edema.  Atelectasis versus infiltrate right lung base.   Electronically Signed   By: Margaree Mackintosh M.D.   On: 01/11/2014 13:23    Scheduled Meds: . aspirin  325 mg Oral Daily  . atorvastatin  40 mg Oral q1800  . buPROPion  150 mg Oral Daily  . carvedilol  25 mg Oral BID WC  . ferrous sulfate  325 mg Oral Q breakfast  . furosemide  60 mg Intravenous BID  . gabapentin  100 mg Oral BID  . heparin  5,000 Units Subcutaneous 3 times per day  . insulin aspart  0-9 Units Subcutaneous TID WC  . insulin glargine  30 Units Subcutaneous QHS  . levofloxacin (LEVAQUIN) IV  750 mg Intravenous Q48H  . omega-3 acid ethyl esters  1 g Oral TID  . sodium chloride  3 mL Intravenous Q12H   Continuous Infusions:    Charlynne Cousins  [facility] Pager [phone] If 8PM-8AM, please contact night-coverage at [email or web address], password [facility] 01/12/2014, 8:13 AM  LOS: 1 day

## 2014-01-12 NOTE — Evaluation (Signed)
Physical Therapy Evaluation Patient Details Name: [name] MRN: [id number] DOB: [birth year] Today's Date: 01/12/2014 Time: [id number] PT Time Calculation (min): 28 min  PT Assessment / Plan / Recommendation History of Present Illness  [name] is a 63 y.o. female with PMH of HTN, HPL, PAD s/p L BKA, IDDM, CAD, CKD, CHF presented with progressive SOB, orthopnea, LE edema found to have CHF with pulmonary edema, mild hypoxia, improved on IV lasix, oxygen provided in ED  Clinical Impression  Patient presents with decreased independence with mobility due to deficits listed below (PT problem list).  She will benefit from skilled PT in the acute setting to allow return home with family assist and to prosthetic training at outpatient PT.    PT Assessment  Patient needs continued PT services    Follow Up Recommendations  Outpatient PT    Does the patient have the potential to tolerate intense rehabilitation    N/A  Barriers to Discharge  None      Equipment Recommendations  None recommended by PT    Recommendations for Other Services   None  Frequency Min 3X/week    Precautions / Restrictions Precautions Precautions: Fall Precaution Comments: reports falls at home, but able to get back up pulling up on chair   Pertinent Vitals/Pain No pain      Mobility  Bed Mobility Overal bed mobility: Modified Independent Transfers Overall transfer level: Modified independent General transfer comment: for bed to [facility], reports not standing at home and not using walker for hopping; willing to work on those while here and may be able to get neice to bring in prosthesis    Exercises     PT Diagnosis: Abnormality of gait  PT Problem List: Decreased mobility;Decreased balance;Decreased knowledge of use of DME;Decreased activity tolerance;Decreased strength PT Treatment Interventions: DME instruction;Functional mobility training;Therapeutic activities;Patient/family education;Neuromuscular  re-education;Balance training;Wheelchair mobility training;Other (comment) (Prosthetic training)     PT Goals(Current goals can be found in the care plan section) Acute Rehab PT Goals Patient Stated Goal: To return home with family assist PT Goal Formulation: With patient Time For Goal Achievement: 01/26/14 Potential to Achieve Goals: Good  Visit Information  Last PT Received On: 01/12/14 Assistance Needed: +1 History of Present Illness: [name] is a 63 y.o. female with PMH of HTN, HPL, PAD s/p L BKA, IDDM, CAD, CKD, CHF presented with progressive SOB, orthopnea, LE edema found to have CHF with pulmonary edema, mild hypoxia, improved on IV lasix, oxygen provided in ED       Prior [place] expects to be discharged to:: Private residence Living Arrangements: Children Available Help at Discharge: Family Type of Home: House Home Access: Stairs to enter Technical brewer of Steps: 4 Entrance Stairs-Rails: None Home Layout: Two level;Bed/bath upstairs;Laundry or work area in [place]: Environmental consultant - 2 wheels;Tub bench;Bedside commode;Wheelchair - Education administrator (comment);Hospital bed (new prosthesis has only just started prosthetic training) Prior Function Level of Independence: Needs assistance Gait / Transfers Assistance Needed: A for tub transfer; transfers to [facility] independent, reports someone bumps her up in wheelchair to enter home and she crawls upstairs to get to bedroom despite having hospital bed in living room. ADL's / Homemaking Assistance Needed: assist for meals  Communication Communication: No difficulties Dominant Hand: Right    Cognition  Cognition Arousal/Alertness: Awake/alert Behavior During Therapy: WFL for tasks assessed/performed Overall Cognitive Status: Within Functional Limits for tasks assessed    Extremity/Trunk Assessment Upper Extremity Assessment Upper Extremity Assessment: Overall WFL for  tasks  assessed Lower Extremity Assessment Lower Extremity Assessment: Overall WFL for tasks assessed   Balance Balance Overall balance assessment: Needs assistance Sitting balance-Leahy Scale: Normal Standing balance comment: NT, but reports not doing any standing at home  End of Session PT - End of Session Activity Tolerance: Patient tolerated treatment well Patient left: with call bell/phone within reach;Other (comment) (on [facility] ) Nurse Communication: Other (comment) (pt on [facility])  GP     [name],CYNDI 01/12/2014, 4:15 PM [place], [place] 01/12/2014

## 2014-01-12 NOTE — Progress Notes (Signed)
1328 CMT  Notified pt had runs pf Vtach pt resting comfortably .Denied chest pain or palpitation  No sob . Vs charted . Continue monitoring done.

## 2014-01-13 ENCOUNTER — Encounter: Payer: Medicare Other | Admitting: Physical Therapy

## 2014-01-13 LAB — GLUCOSE, CAPILLARY
GLUCOSE-CAPILLARY: 185 mg/dL — AB (ref 70–99)
GLUCOSE-CAPILLARY: 186 mg/dL — AB (ref 70–99)
Glucose-Capillary: 151 mg/dL — ABNORMAL HIGH (ref 70–99)
Glucose-Capillary: 127 mg/dL — ABNORMAL HIGH (ref 70–99)

## 2014-01-13 MED ORDER — FUROSEMIDE 10 MG/ML IJ SOLN
80.0000 mg | Freq: Two times a day (BID) | INTRAMUSCULAR | Status: AC
  Administered 2014-01-13 – 2014-01-14 (×3): 80 mg via INTRAVENOUS
  Filled 2014-01-13 (×3): qty 8

## 2014-01-13 MED ORDER — POLYETHYLENE GLYCOL 3350 17 G PO PACK
17.0000 g | PACK | Freq: Every day | ORAL | Status: DC
  Administered 2014-01-13 – 2014-01-14 (×2): 17 g via ORAL
  Filled 2014-01-13 (×3): qty 1

## 2014-01-13 NOTE — Progress Notes (Signed)
PT Cancellation Note  Patient Details Name: [name] MRN: [id number] DOB: 30-Aug-1951   Cancelled Treatment:    Reason Eval/Treat Not Completed: Other (comment), pt still without prosthesis and requests to wait to work with PT until prosthesis available, pt asking family to bring ASAP.   [place], [place] 01/13/2014, 4:13 PM

## 2014-01-13 NOTE — Progress Notes (Signed)
 [facility] PROGRESS NOTE Interim History: 62 y.o. female with PMH of HTN, HPL, PAD s/p L BKA, IDDM, CAD, CKD, CHF presented with progressive SOB, orthopnea, LE edema found to have CHF with pulmonary edema, mild hypoxia,  Filed Weights   01/11/14 1711 01/12/14 0700 01/13/14 0501  Weight: 115.4 kg (254 lb 6.6 oz) 113.1 kg (249 lb 5.4 oz) 112.2 kg (247 lb 5.7 oz)        Intake/Output Summary (Last 24 hours) at 01/13/14 2217 Last data filed at 01/13/14 1900  Gross per 24 hour  Intake   1168 ml  Output   2176 ml  Net  -1008 ml     Assessment/Plan: Acute respiratory failure Acute on chronic diastolic CHF (congestive heart failure), NYHA class 3 - Estimated dry weight <230-235 lbs.  On admission 2.23.2015 254lb (115.2 kg). ? Cardiorenal syndrome. -currently (2/25>> 247) - Mild increase in Cr with diuresis. Check Urine Sodium & cr. - Change lasix to IV BID, had improvement in her weight and urine output -continue TED hose - B-met daily and strict I and O's. -patient with improvement in her orthopnea. -continue b-blocker  UTI (lower urinary tract infection) - UC never taken -afebrile and currently w/o dysuria -will give 1 extra dose IV rocephin (to complete 3 IV doses) and then switch to ceftin for 2 doses to complete empirically 5 days of antibiotics -WBC's WNL  Chronic kidney disease (CKD), stage IV (severe): - baseline Cr 1.6-1.8. - monitor especially with IV diuresis -BMET in am.  Obesity, Class III, BMI 40-49.9 (morbid obesity) -counseling for low calorie diet -will need outpatient work up for OSA.  Type II or unspecified type diabetes mellitus with renal manifestations, uncontrolled - slight hypoglycemia while inpatient -A1C 7.4 -continue reduced lantus and SSI    Code Status: full Family Communication: none  Disposition Plan: home when medically stable   Consultants:  none  Procedures: ECHO 4.30.2014: estimated ejection fraction was in the range  of 65% to 70%. Wall motion was normal; there were no regional wall motion abnormalities. Early diastolic septal annular tissue Doppler velocities Ea were abnormal  Antibiotics:  Rocephin 2.24.2015.  HPI/Subjective: Afebrile, breathing better; reports orthopnea pretty much resolved.  Objective: Filed Vitals:   01/13/14 0501 01/13/14 1022 01/13/14 1430 01/13/14 2045  BP: 151/66 156/77 133/60 150/67  Pulse: 72 71 71 67  Temp: 98 F (36.7 C) 98.4 F (36.9 C) 98.5 F (36.9 C) 98 F (36.7 C)  TempSrc: Oral Oral Oral Oral  Resp: 18 18 20 20  Height:      Weight: 112.2 kg (247 lb 5.7 oz)     SpO2: 97% 98% 98% 97%     Exam:  General: Alert, awake, oriented x3, in no acute distress. Afebrile ad feeling/breathing better HEENT: No bruits, no goiter. Mild JVD present Heart: Regular rate and rhythm, 2-3+ edema lower ext. Lungs: Improved air movement, slight crackles at bases Abdomen: Soft, nontender, nondistended, positive bowel sounds.    Data Reviewed: Basic Metabolic Panel:  Recent Labs Lab 01/11/14 1139 01/12/14 0505  NA 144 145  K 5.0 4.3  CL 109 106  CO2 20 25  GLUCOSE 162* 126*  BUN 43* 46*  CREATININE 1.85* 2.13*  CALCIUM 9.3 9.2   Liver Function Tests:  Recent Labs Lab 01/11/14 1139  AST 26  ALT 13  ALKPHOS 126*  BILITOT 0.2*  PROT 7.9  ALBUMIN 2.9*   CBC:  Recent Labs Lab 01/11/14 1139  WBC 6.8  HGB 10.7*    HCT 33.7*  MCV 87.8  PLT 216   Cardiac Enzymes:  Recent Labs Lab 01/11/14 1139  TROPONINI <0.30   BNP (last 3 results)  Recent Labs  01/11/14 1139  PROBNP 1310.0*   CBG:  Recent Labs Lab 01/12/14 2111 01/13/14 0625 01/13/14 1203 01/13/14 1640 01/13/14 2124  GLUCAP 171* 151* 127* 185* 186*    Studies: No results found.  Scheduled Meds: . aspirin  325 mg Oral Daily  . atorvastatin  40 mg Oral q1800  . buPROPion  150 mg Oral Daily  . carvedilol  25 mg Oral BID WC  . cefTRIAXone (ROCEPHIN)  IV  1 g Intravenous Q24H   . ferrous sulfate  325 mg Oral Q breakfast  . furosemide  80 mg Intravenous BID  . gabapentin  100 mg Oral BID  . heparin  5,000 Units Subcutaneous 3 times per day  . insulin aspart  0-9 Units Subcutaneous TID WC  . insulin aspart  3 Units Subcutaneous TID WC  . insulin glargine  20 Units Subcutaneous QHS  . omega-3 acid ethyl esters  1 g Oral TID  . polyethylene glycol  17 g Oral Daily  . sodium chloride  3 mL Intravenous Q12H   Continuous Infusions:   Time > 30 minutes  [name],[name]  [facility] Pager [phone] If 8PM-8AM, please contact night-coverage at [email or web address], password [email or web address] 01/13/2014, 10:17 PM  LOS: 2 days        

## 2014-01-14 LAB — GLUCOSE, CAPILLARY
Glucose-Capillary: 185 mg/dL — ABNORMAL HIGH (ref 70–99)
Glucose-Capillary: 145 mg/dL — ABNORMAL HIGH (ref 70–99)
Glucose-Capillary: 196 mg/dL — ABNORMAL HIGH (ref 70–99)

## 2014-01-14 LAB — BASIC METABOLIC PANEL
BUN: 60 mg/dL — ABNORMAL HIGH (ref 6–23)
CALCIUM: 9.3 mg/dL (ref 8.4–10.5)
CO2: 25 mEq/L (ref 19–32)
Chloride: 99 mEq/L (ref 96–112)
Creatinine, Ser: 2.54 mg/dL — ABNORMAL HIGH (ref 0.50–1.10)
GFR, EST AFRICAN AMERICAN: 22 mL/min — AB (ref >90)
GFR, EST NON AFRICAN AMERICAN: 19 mL/min — AB (ref >90)
Glucose, Bld: 224 mg/dL — ABNORMAL HIGH (ref 70–99)
Potassium: 4.4 mEq/L (ref 3.7–5.3)
Sodium: 141 mEq/L (ref 137–147)

## 2014-01-14 MED ORDER — FUROSEMIDE 40 MG PO TABS
40.0000 mg | ORAL_TABLET | Freq: Two times a day (BID) | ORAL | Status: DC
  Administered 2014-01-15: 40 mg via ORAL
  Filled 2014-01-14 (×3): qty 1

## 2014-01-14 MED ORDER — CEFUROXIME AXETIL 500 MG PO TABS
500.0000 mg | ORAL_TABLET | Freq: Two times a day (BID) | ORAL | Status: DC
  Administered 2014-01-15: 500 mg via ORAL
  Filled 2014-01-14 (×3): qty 1

## 2014-01-14 NOTE — Progress Notes (Signed)
[facility] PROGRESS NOTE Interim History: 63 y.o. female with PMH of HTN, HPL, PAD s/p L BKA, IDDM, CAD, CKD, CHF presented with progressive SOB, orthopnea, LE edema found to have CHF with pulmonary edema, mild hypoxia,  Filed Weights   01/12/14 0700 01/13/14 0501 01/14/14 0621  Weight: 113.1 kg (249 lb 5.4 oz) 112.2 kg (247 lb 5.7 oz) 113.7 kg (250 lb 10.6 oz)        Intake/Output Summary (Last 24 hours) at 01/14/14 1440 Last data filed at 01/14/14 1300  Gross per 24 hour  Intake    956 ml  Output   2954 ml  Net  -1998 ml     Assessment/Plan: Acute respiratory failure Acute on chronic diastolic CHF (congestive heart failure), NYHA class 3 - Estimated dry weight <230-235 lbs.  On admission 2.23.2015 254lb (115.2 kg). ? Cardiorenal syndrome. -weight registered is not going along with degree of diuresis; definitely non reliable.   - increase in Cr with IV diuresis; but GFR essentially the same Check Urine Sodium & cr. - Change lasix to PO (5m) BID, had improvement in her weight and urine output -continue TED hose - B-met daily and strict I and O's. -patient with improvement in her orthopnea and overall fluid overload status . -continue b-blocker -repeat BNP in am  UTI (lower urinary tract infection) - UC never taken -afebrile and currently w/o dysuria -will give 1 extra dose IV rocephin (to complete 3 IV doses) and then switch to ceftin for 2 doses to complete empirically 5 days of antibiotics -WBC's WNL  Acute on chronic Chronic kidney disease (CKD), stage IV (severe): - baseline Cr 1.6-1.8. -Cr currently 2.5; due to IV diuresis -GFR essentially unchanged -BMET in am. -lasix changed to PO  Obesity, Class III, BMI 40-49.9 (morbid obesity) -counseling for low calorie diet -will need outpatient work up for OSA.  Type II or unspecified type diabetes mellitus with renal manifestations, uncontrolled - slight hypoglycemia while inpatient -A1C 7.4 -continue  reduced dose of lantus and SSI   Code Status: full Family Communication: none  Disposition Plan: home when medically stable   Consultants:  none  Procedures: ECHO 4.30.2014: estimated ejection fraction was in the range of 65% to 70%. Wall motion was normal; there were no regional wall motion abnormalities. Early diastolic septal annular tissue Doppler velocities Ea were abnormal  Antibiotics:  Rocephin 2.24.2015.  HPI/Subjective: Afebrile, breathing great; good O2 sat on RA; able to laying down flat w/o orthopnea.  Objective: Filed Vitals:   01/13/14 1022 01/13/14 1430 01/13/14 2045 01/14/14 0621  BP: 156/77 133/60 150/67 128/52  Pulse: 71 71 67 76  Temp: 98.4 F (36.9 C) 98.5 F (36.9 C) 98 F (36.7 C) 98.6 F (37 C)  TempSrc: Oral Oral Oral Oral  Resp: '18 20 20 20  ' Height:      Weight:    113.7 kg (250 lb 10.6 oz)  SpO2: 98% 98% 97% 92%     Exam:  General: Alert, awake, oriented x3, in no acute distress. Afebrile ad feeling/breathing better HEENT: No bruits, no goiter. No JVD present Heart: Regular rate and rhythm, 2++ edema lower ext. Lungs: Improved air movement, slight crackles at bases Abdomen: Soft, nontender, nondistended, positive bowel sounds.    Data Reviewed: Basic Metabolic Panel:  Recent Labs Lab 01/11/14 1139 01/12/14 0505 01/14/14 0437  NA 144 145 141  K 5.0 4.3 4.4  CL 109 106 99  CO2 '20 25 25  ' GLUCOSE 162* 126* 224*  BUN 43* 46* 60*  CREATININE 1.85* 2.13* 2.54*  CALCIUM 9.3 9.2 9.3   Liver Function Tests:  Recent Labs Lab 01/11/14 1139  AST 26  ALT 13  ALKPHOS 126*  BILITOT 0.2*  PROT 7.9  ALBUMIN 2.9*   CBC:  Recent Labs Lab 01/11/14 1139  WBC 6.8  HGB 10.7*  HCT 33.7*  MCV 87.8  PLT 216   Cardiac Enzymes:  Recent Labs Lab 01/11/14 1139  TROPONINI <0.30   BNP (last 3 results)  Recent Labs  01/11/14 1139  PROBNP 1310.0*   CBG:  Recent Labs Lab 01/13/14 1203 01/13/14 1640 01/13/14 2124  01/14/14 0628 01/14/14 1106  GLUCAP 127* 185* 186* 185* 145*    Studies: No results found.  Scheduled Meds: . aspirin  325 mg Oral Daily  . atorvastatin  40 mg Oral q1800  . buPROPion  150 mg Oral Daily  . carvedilol  25 mg Oral BID WC  . [START ON 01/15/2014] cefUROXime  500 mg Oral BID WC  . ferrous sulfate  325 mg Oral Q breakfast  . furosemide  80 mg Intravenous BID  . [START ON 01/15/2014] furosemide  40 mg Oral BID  . gabapentin  100 mg Oral BID  . heparin  5,000 Units Subcutaneous 3 times per day  . insulin aspart  0-9 Units Subcutaneous TID WC  . insulin aspart  3 Units Subcutaneous TID WC  . insulin glargine  20 Units Subcutaneous QHS  . omega-3 acid ethyl esters  1 g Oral TID  . polyethylene glycol  17 g Oral Daily  . sodium chloride  3 mL Intravenous Q12H   Continuous Infusions:   Time <  30 minutes  Terralyn Matsumura  [facility] Pager [phone] If 8PM-8AM, please contact night-coverage at [email or web address], password [email or web address] 01/14/2014, 2:40 PM  LOS: 3 days

## 2014-01-14 NOTE — Progress Notes (Signed)
Physical Therapy Treatment Patient Details Name: [name] MRN: [id number] DOB: 11/09/1951 Today's Date: 01/14/2014 Time: [id number] PT Time Calculation (min): 40 min  PT Assessment / Plan / Recommendation  History of Present Illness [name] is a 63 y.o. female with PMH of HTN, HPL, PAD s/p L BKA, IDDM, CAD, CKD, CHF presented with progressive SOB, orthopnea, LE edema found to have CHF with pulmonary edema, mild hypoxia, improved on IV lasix, oxygen provided in ED   PT Comments   Pt tolerated well and excited to be on her prosthesis and it fit today. Began with pre gait weight shifting and activities, tolerated well and  ended with pt applying her shrinker independently. From out standpoint, pt will be able to resume with her session at [facility].   Follow Up Recommendations  Outpatient PT     Does the patient have the potential to tolerate intense rehabilitation     Barriers to Discharge        Equipment Recommendations  None recommended by PT    Recommendations for Other Services    Frequency Min 3X/week   Progress towards PT Goals Progress towards PT goals: Progressing toward goals  Plan Current plan remains appropriate    Precautions / Restrictions Precautions Precautions: Fall   Pertinent Vitals/Pain No c/o pain    Mobility  Bed Mobility Overal bed mobility: Modified Independent Transfers Overall transfer level: Needs assistance Equipment used: Rolling walker (2 wheeled) Transfers: Sit to/from Stand Sit to Stand: Min assist General transfer comment: worked on sit to stand with prosthesis from EOB (4 x) did well.  Ambulation/Gait Ambulation/Gait assistance: Min assist General Gait Details: worked on [employer] with weight shifting r/L 10x, and stepping forward 1 step and backwards one step. Perfomred this sequnce 3 times with seated reast break. Toerated well. Very excited to be able to bear weight on prosthesis  and only slight pain"unusual feeling"  initially.     Exercises Other Exercises Other Exercises: sat EOB and has pt help with donning liner and prosthesis . REquyired Moderate assist.    PT Diagnosis:    PT Problem List:   PT Treatment Interventions:     PT Goals (current goals can now be found in the care plan section) Acute Rehab PT Goals Patient Stated Goal: To return home with family assist PT Goal Formulation: With patient Time For Goal Achievement: 01/26/14 Potential to Achieve Goals: Good  Visit Information  Last PT Received On: 01/14/14 Assistance Needed: +1 (+2 for chair to follow if begin ambulation) History of Present Illness: [name] is a 63 y.o. female with PMH of HTN, HPL, PAD s/p L BKA, IDDM, CAD, CKD, CHF presented with progressive SOB, orthopnea, LE edema found to have CHF with pulmonary edema, mild hypoxia, improved on IV lasix, oxygen provided in ED    Subjective Data  Subjective: I want to get better so I can continue with my OPPT for walking with my prostheisis Patient Stated Goal: To return home with family assist   Cognition  Cognition Arousal/Alertness: Awake/alert Behavior During Therapy: WFL for tasks assessed/performed Overall Cognitive Status: Within Functional Limits for tasks assessed    Balance     End of Session PT - End of Session Equipment Utilized During Treatment: Gait belt Activity Tolerance: Patient tolerated treatment well Patient left: with call bell/phone within reach;Other (comment)   GP     [name] 01/14/2014, 2:13 PM Clide Dales, PT Pager: [phone] 01/14/2014

## 2014-01-15 DIAGNOSIS — E1169 Type 2 diabetes mellitus with other specified complication: Secondary | ICD-10-CM

## 2014-01-15 DIAGNOSIS — I10 Essential (primary) hypertension: Secondary | ICD-10-CM

## 2014-01-15 DIAGNOSIS — E785 Hyperlipidemia, unspecified: Secondary | ICD-10-CM

## 2014-01-15 DIAGNOSIS — I6350 Cerebral infarction due to unspecified occlusion or stenosis of unspecified cerebral artery: Secondary | ICD-10-CM

## 2014-01-15 LAB — BASIC METABOLIC PANEL
BUN: 59 mg/dL — ABNORMAL HIGH (ref 6–23)
CALCIUM: 9.3 mg/dL (ref 8.4–10.5)
CHLORIDE: 100 meq/L (ref 96–112)
CO2: 28 mEq/L (ref 19–32)
CREATININE: 2.65 mg/dL — AB (ref 0.50–1.10)
GFR calc non Af Amer: 18 mL/min — ABNORMAL LOW (ref >90)
GFR, EST AFRICAN AMERICAN: 21 mL/min — AB (ref >90)
Glucose, Bld: 172 mg/dL — ABNORMAL HIGH (ref 70–99)
Potassium: 4.2 mEq/L (ref 3.7–5.3)
Sodium: 141 mEq/L (ref 137–147)

## 2014-01-15 LAB — GLUCOSE, CAPILLARY
GLUCOSE-CAPILLARY: 128 mg/dL — AB (ref 70–99)
Glucose-Capillary: 178 mg/dL — ABNORMAL HIGH (ref 70–99)

## 2014-01-15 LAB — PRO B NATRIURETIC PEPTIDE: PRO B NATRI PEPTIDE: 829.3 pg/mL — AB (ref 0–125)

## 2014-01-15 MED ORDER — FUROSEMIDE 40 MG PO TABS: 40.0000 mg | ORAL_TABLET | Freq: Two times a day (BID) | ORAL | Status: DC

## 2014-01-15 MED ORDER — CEFUROXIME AXETIL 500 MG PO TABS: 500.0000 mg | ORAL_TABLET | Freq: Two times a day (BID) | ORAL | Status: DC

## 2014-01-15 NOTE — Discharge Summary (Signed)
Physician Discharge Summary  [name] [id number] DOB: Sep 21, 1951 DOA: 01/11/2014  PCP: Hayden Rasmussen., MD  Admit date: 01/11/2014 Discharge date: 01/15/2014  Time spent: >30 minutes  Recommendations for Outpatient Follow-up:  1. BMET to follow renal function and electrolytes 2. Reassess BP and CBG's/diabetes; adjust antihypertensive and hypoglycemic regimen as needed   BNP    Component Value Date/Time   PROBNP 829.3* 01/15/2014 0320   Filed Weights   01/13/14 0501 01/14/14 0621 01/15/14 9678  Weight: 112.2 kg (247 lb 5.7 oz) 113.7 kg (250 lb 10.6 oz) 109.1 kg (240 lb 8.4 oz)     Discharge Diagnoses:  Principal Problem:   Acute on chronic diastolic CHF (congestive heart failure), NYHA class 3 Active Problems:   Type II or unspecified type diabetes mellitus with renal manifestations, uncontrolled   CAD (coronary artery disease), native coronary artery   Obesity, Class III, BMI 40-49.9 (morbid obesity)   Peripheral arterial disease   Chronic kidney disease (CKD), stage IV (severe)   UTI (lower urinary tract infection)   AKI (acute kidney injury)   Acute diastolic congestive heart failure, NYHA class 2   Discharge Condition: stable and improved. Will discharge home with follow up in 1 week with PCP.  Diet recommendation: low sodium , low calorie and low carbohydrates diet  History of present illness:  63 y.o. female with PMH of HTN, HPL, PAD s/p L BKA, IDDM, CAD, CKD, CHF presented with progressive SOB, orthopnea, LE edema found to have CHF with pulmonary edema, mild hypoxia, improved on IV lasix, oxygen provided in ED; denies chest pain, no nausea, vomiting, or diarrhea, no focal neuro symptoms, no fever   Hospital Course:  Acute respiratory failure Acute on chronic diastolic CHF (congestive heart failure), NYHA class 3  - Estimated dry weight <230-235 lbs. On admission 2.23.2015 254lb (115.2 kg). ? Cardiorenal syndrome.  -at discharge weight 240; no JVD, 1  plus edema LE and no crackles. - increase in Cr with IV diuresis; but GFR essentially the same  - Change lasix to PO (67m) BID, had improvement in her weight and urine output  -continue TED hose  -B-met in 1 week during follow up with PCP -patient no longer complaining of orthopnea or SOB; able to apply prosthesis since swelling is pretty much resolved. -continue b-blocker  -BNP down to 800 range at discharge  UTI (lower urinary tract infection)  - UC never taken  -afebrile and currently w/o dysuria  -will give 1 extra dose IV rocephin (to complete 3 IV doses) and then switch to ceftin for 2 more days to complete empirically regimen with antibiotics  -WBC's WNL , afebrile and w/o dysuria currently.  Acute on chronic Chronic kidney disease (CKD), stage IV (severe):  - baseline Cr 1.6-1.8.  -Cr currently 2.5; due to IV diuresis and UTI -GFR essentially unchanged  -BMET in 1 week during follow up -lasix changed to PO and dose adjusted -anticipate return of renal function to previous baseline   Obesity, Class III, BMI 40-49.9 (morbid obesity)  -counseling for low calorie diet  -will need outpatient work up for OSA.   Type II or unspecified type diabetes mellitus with renal manifestations, uncontrolled  - slight hypoglycemia while inpatient  -A1C 7.4  -continue home regimen and low carbohydrates diet  *Rest of medical problems remains stable and the plan is to continue current medication regimen. Follow up with PCP for adjustments to medication regimen as needed  Procedures:  None Reviewed ECHO 4.30.2014: estimated ejection fraction  was in the range of 65% to 70%. Wall motion was normal; there were no regional wall motion abnormalities. Early diastolic septal annular tissue Doppler velocities Ea were abnormal   Consultations:  None   Discharge Exam: Filed Vitals:   01/15/14 1356  BP: 117/48  Pulse: 72  Temp: 98.1 F (36.7 C)  Resp: 20   General: Alert, awake, oriented  x3, in no acute distress. Afebrile ad feeling/breathing great  HEENT: No bruits, no goiter. No JVD present  Heart: Regular rate and rhythm, 1++ edema lower ext.  Lungs: Improved air movement, slight crackles at bases  Abdomen: Soft, nontender, nondistended, positive bowel sounds.    Discharge Instructions  Discharge Orders   Future Appointments Provider Department Dept Phone   01/18/2014 11:00 AM Isaias Cowman, [place] [phone]   01/20/2014 11:00 AM Isaias Cowman, [place] [phone]   01/25/2014 11:00 AM Isaias Cowman, [place] [phone]   01/27/2014 11:00 AM [place], [place] [phone]   02/01/2014 11:00 AM Isaias Cowman, [place] [phone]   02/03/2014 11:00 AM [place], [place] [phone]   02/08/2014 11:00 AM [place], [place] [phone]   02/10/2014 11:00 AM Isaias Cowman, [place] [phone]   02/15/2014 11:00 AM Will[place] Ora, [place] [phone]   02/17/2014 11:00 AM Isaias Cowman, [place] [phone]   02/22/2014 11:00 AM Willow Ora, [place] [phone]   02/24/2014 11:00 AM Isaias Cowman, [place] [phone]   03/01/2014 11:00 AM [place], [place] [phone]   03/03/2014 11:00 AM Isaias Cowman, [place] [phone]   Future Orders Complete By Expires   Diet - low  sodium heart healthy  As directed    Discharge instructions  As directed    Comments:     Take medications as prescribed Follow a low sodium diet (less than 2 grams daily) Arrange follow up with PCP in 1 week Follow a low calorie and low carbohydrates diet as well       Medication List         aspirin 325 MG tablet  Take 325 mg by mouth daily. Take 30 minutes after taking  500 cr niaspan     atorvastatin 40 MG tablet  Commonly known as:  LIPITOR  Take 1 tablet (40 mg total) by mouth daily at 6 PM.     buPROPion 150 MG 24 hr tablet  Commonly known as:  WELLBUTRIN XL  Take 150 mg by mouth daily.     carvedilol 25 MG tablet  Commonly known as:  COREG  Take 25 mg by mouth 2 (two) times daily with a meal.     cefUROXime 500 MG tablet  Commonly known as:  CEFTIN  Take 1 tablet (500 mg total) by mouth 2 (two) times daily with a meal.     ferrous sulfate 325 (65 FE) MG tablet  Take 325 mg by mouth daily with breakfast.     furosemide 40 MG tablet  Commonly known as:  LASIX  Take 1 tablet (40 mg total) by mouth 2 (two) times daily.     gabapentin 100 MG capsule  Commonly known as:  NEURONTIN  Take 1 capsule (100 mg total) by mouth 2 (two)  times daily.     insulin aspart 100 UNIT/ML injection  Commonly known as:  novoLOG  Inject 0-15 Units into the skin 3 (three) times daily with meals. Sliding scale insulin CBG 70 - 120: 0 units: CBG 121 - 150: 2 units; CBG 151 - 200: 3 units; CBG 201 - 250: 5 units; CBG 251 - 300: 8 units;CBG 301 - 350: 11 units; CBG 351 - 400: 15 units; CBG > 400 : 15 units and notify your doctor     insulin glargine 100 UNIT/ML injection  Commonly known as:  LANTUS  Inject 30 Units into the skin at bedtime.     omega-3 acid ethyl esters 1 G capsule  Commonly known as:  LOVAZA  Take 1 g by mouth 3 (three) times daily.       No Known Allergies     Follow-up Information   Follow up with [facility] L., MD. Schedule an appointment as soon as  possible for a visit in 1 week.   Specialty:  Family Medicine   Contact information:   [address] [place] [address] [phone]       The results of significant diagnostics from this hospitalization (including imaging, microbiology, ancillary and laboratory) are listed below for reference.    Significant Diagnostic Studies: Dg Chest 2 View  01/11/2014   CLINICAL DATA:  cough, SOB  EXAM: CHEST  2 VIEW  COMPARISON:  DG CHEST 1V PORT dated 08/11/2013  FINDINGS: There is diffuse prominence of the interstitial markings is well as areas of peribronchial cuffing. Increased density projects within the right lung base. The cardiac silhouette is enlarged. The osseous structures unremarkable. Evaluation is limited secondary to patient body habitus. Fluid versus thickening is identified along the minor fissure on the right.  IMPRESSION: Interstitial infiltrate.  Likely reflecting pulmonary edema.  Atelectasis versus infiltrate right lung base.   Electronically Signed   By: Margaree Mackintosh M.D.   On: 01/11/2014 13:23   Labs: Basic Metabolic Panel:  Recent Labs Lab 01/11/14 1139 01/12/14 0505 01/14/14 0437 01/15/14 0830  NA 144 145 141 141  K 5.0 4.3 4.4 4.2  CL 109 106 99 100  CO2 _0 GLUCOSE 162* 126* 224* 172*  BUN 43* 46* 60* 59*  CREATININE 1.85* 2.13* 2.54* 2.65*  CALCIUM 9.3 9.2 9.3 9.3   Liver Function Tests:  Recent Labs Lab 01/11/14 1139  AST 26  ALT 13  ALKPHOS 126*  BILITOT 0.2*  PROT 7.9  ALBUMIN 2.9*   CBC:  Recent Labs Lab 01/11/14 1139  WBC 6.8  HGB 10.7*  HCT 33.7*  MCV 87.8  PLT 216   Cardiac Enzymes:  Recent Labs Lab 01/11/14 1139  TROPONINI <0.30   BNP: BNP (last 3 results)  Recent Labs  01/11/14 1139 01/15/14 0320  PROBNP 1310.0* 829.3*   CBG:  Recent Labs Lab 01/14/14 0628 01/14/14 1106 01/14/14 1605 01/15/14 0600 01/15/14 1126  GLUCAP 185* 145* 196* 128* 178*     Signed:  Calleen Alvis  [facility] 01/15/2014, 2:34 PM

## 2014-01-15 NOTE — Progress Notes (Signed)
UR completed Tamotsu Wiederholt K. Kiffany Schelling, RN, BSN, MSHL, CCM  01/15/2014 3:10 PM

## 2014-01-18 ENCOUNTER — Ambulatory Visit: Payer: Medicare Other | Attending: Orthopedic Surgery | Admitting: Physical Therapy

## 2014-01-18 DIAGNOSIS — R269 Unspecified abnormalities of gait and mobility: Secondary | ICD-10-CM | POA: Insufficient documentation

## 2014-01-18 DIAGNOSIS — E119 Type 2 diabetes mellitus without complications: Secondary | ICD-10-CM | POA: Insufficient documentation

## 2014-01-18 DIAGNOSIS — I129 Hypertensive chronic kidney disease with stage 1 through stage 4 chronic kidney disease, or unspecified chronic kidney disease: Secondary | ICD-10-CM | POA: Insufficient documentation

## 2014-01-18 DIAGNOSIS — I2510 Atherosclerotic heart disease of native coronary artery without angina pectoris: Secondary | ICD-10-CM | POA: Insufficient documentation

## 2014-01-18 DIAGNOSIS — N184 Chronic kidney disease, stage 4 (severe): Secondary | ICD-10-CM | POA: Insufficient documentation

## 2014-01-18 DIAGNOSIS — Z4789 Encounter for other orthopedic aftercare: Secondary | ICD-10-CM | POA: Insufficient documentation

## 2014-01-18 DIAGNOSIS — R5381 Other malaise: Secondary | ICD-10-CM | POA: Insufficient documentation

## 2014-01-18 DIAGNOSIS — Z8673 Personal history of transient ischemic attack (TIA), and cerebral infarction without residual deficits: Secondary | ICD-10-CM | POA: Insufficient documentation

## 2014-01-18 DIAGNOSIS — S88119A Complete traumatic amputation at level between knee and ankle, unspecified lower leg, initial encounter: Secondary | ICD-10-CM | POA: Insufficient documentation

## 2014-01-20 ENCOUNTER — Ambulatory Visit: Payer: Medicare Other | Admitting: Physical Therapy

## 2014-01-25 ENCOUNTER — Ambulatory Visit: Payer: Medicare Other | Admitting: Physical Therapy

## 2014-01-27 ENCOUNTER — Ambulatory Visit: Payer: Medicare Other | Admitting: Physical Therapy

## 2014-02-01 ENCOUNTER — Ambulatory Visit: Payer: Medicare Other | Admitting: Physical Therapy

## 2014-02-03 ENCOUNTER — Encounter: Payer: Medicare Other | Admitting: Physical Therapy

## 2014-02-08 ENCOUNTER — Ambulatory Visit: Payer: Medicare Other | Admitting: Physical Therapy

## 2014-02-10 ENCOUNTER — Ambulatory Visit: Payer: Medicare Other | Admitting: Physical Therapy

## 2014-02-15 ENCOUNTER — Ambulatory Visit: Payer: Medicare Other | Admitting: Physical Therapy

## 2014-02-17 ENCOUNTER — Ambulatory Visit: Payer: Medicare Other | Attending: Orthopedic Surgery | Admitting: Physical Therapy

## 2014-02-17 DIAGNOSIS — Z8673 Personal history of transient ischemic attack (TIA), and cerebral infarction without residual deficits: Secondary | ICD-10-CM | POA: Insufficient documentation

## 2014-02-17 DIAGNOSIS — I2510 Atherosclerotic heart disease of native coronary artery without angina pectoris: Secondary | ICD-10-CM | POA: Insufficient documentation

## 2014-02-17 DIAGNOSIS — N184 Chronic kidney disease, stage 4 (severe): Secondary | ICD-10-CM | POA: Insufficient documentation

## 2014-02-17 DIAGNOSIS — R269 Unspecified abnormalities of gait and mobility: Secondary | ICD-10-CM | POA: Insufficient documentation

## 2014-02-17 DIAGNOSIS — I129 Hypertensive chronic kidney disease with stage 1 through stage 4 chronic kidney disease, or unspecified chronic kidney disease: Secondary | ICD-10-CM | POA: Insufficient documentation

## 2014-02-17 DIAGNOSIS — Z4789 Encounter for other orthopedic aftercare: Secondary | ICD-10-CM | POA: Insufficient documentation

## 2014-02-17 DIAGNOSIS — E119 Type 2 diabetes mellitus without complications: Secondary | ICD-10-CM | POA: Insufficient documentation

## 2014-02-17 DIAGNOSIS — S88119A Complete traumatic amputation at level between knee and ankle, unspecified lower leg, initial encounter: Secondary | ICD-10-CM | POA: Insufficient documentation

## 2014-02-17 DIAGNOSIS — R5381 Other malaise: Secondary | ICD-10-CM | POA: Insufficient documentation

## 2014-02-22 ENCOUNTER — Ambulatory Visit: Payer: Medicare Other | Admitting: Physical Therapy

## 2014-02-24 ENCOUNTER — Ambulatory Visit: Payer: Medicare Other | Admitting: Physical Therapy

## 2014-03-01 ENCOUNTER — Encounter: Payer: Medicare Other | Admitting: Physical Therapy

## 2014-03-03 ENCOUNTER — Ambulatory Visit: Payer: Medicare Other | Admitting: Physical Therapy

## 2014-03-09 ENCOUNTER — Encounter: Payer: Medicare Other | Admitting: Physical Therapy

## 2014-03-10 ENCOUNTER — Encounter: Payer: Medicare Other | Admitting: Physical Therapy

## 2014-03-16 ENCOUNTER — Encounter: Payer: Medicare Other | Admitting: Physical Therapy

## 2014-03-17 ENCOUNTER — Encounter: Payer: Medicare Other | Admitting: Physical Therapy

## 2014-03-23 ENCOUNTER — Ambulatory Visit: Payer: Medicare Other | Admitting: Physical Therapy

## 2014-03-24 ENCOUNTER — Encounter: Payer: Medicare Other | Admitting: Physical Therapy

## 2014-03-30 ENCOUNTER — Encounter: Payer: Medicare Other | Admitting: Physical Therapy

## 2014-03-31 ENCOUNTER — Encounter: Payer: Medicare Other | Admitting: Physical Therapy

## 2014-04-06 ENCOUNTER — Encounter: Payer: Medicare Other | Admitting: Physical Therapy

## 2014-04-07 ENCOUNTER — Encounter: Payer: Medicare Other | Admitting: Physical Therapy

## 2014-04-13 ENCOUNTER — Encounter: Payer: Medicare Other | Admitting: Physical Therapy

## 2014-04-14 ENCOUNTER — Encounter: Payer: Medicare Other | Admitting: Physical Therapy

## 2014-04-15 ENCOUNTER — Encounter: Payer: Medicare Other | Attending: Family Medicine | Admitting: Dietician

## 2014-04-15 ENCOUNTER — Encounter: Payer: Self-pay | Admitting: Dietician

## 2014-04-15 VITALS — Ht 64.0 in | Wt 255.0 lb

## 2014-04-15 DIAGNOSIS — N189 Chronic kidney disease, unspecified: Secondary | ICD-10-CM | POA: Insufficient documentation

## 2014-04-15 DIAGNOSIS — E1129 Type 2 diabetes mellitus with other diabetic kidney complication: Secondary | ICD-10-CM

## 2014-04-15 DIAGNOSIS — I2510 Atherosclerotic heart disease of native coronary artery without angina pectoris: Secondary | ICD-10-CM | POA: Insufficient documentation

## 2014-04-15 DIAGNOSIS — I509 Heart failure, unspecified: Secondary | ICD-10-CM | POA: Insufficient documentation

## 2014-04-15 DIAGNOSIS — I129 Hypertensive chronic kidney disease with stage 1 through stage 4 chronic kidney disease, or unspecified chronic kidney disease: Secondary | ICD-10-CM | POA: Insufficient documentation

## 2014-04-15 DIAGNOSIS — Z713 Dietary counseling and surveillance: Secondary | ICD-10-CM | POA: Insufficient documentation

## 2014-04-15 DIAGNOSIS — E1165 Type 2 diabetes mellitus with hyperglycemia: Secondary | ICD-10-CM

## 2014-04-15 DIAGNOSIS — S88119A Complete traumatic amputation at level between knee and ankle, unspecified lower leg, initial encounter: Secondary | ICD-10-CM | POA: Insufficient documentation

## 2014-04-15 DIAGNOSIS — Z794 Long term (current) use of insulin: Secondary | ICD-10-CM | POA: Insufficient documentation

## 2014-04-15 DIAGNOSIS — E119 Type 2 diabetes mellitus without complications: Secondary | ICD-10-CM | POA: Insufficient documentation

## 2014-04-15 NOTE — Progress Notes (Signed)
Appt start time: 1600 end time:  1700.  Assessment:  Patient was seen on 04/15/14 for individual diabetes education. Pt reports with long term difficulty controlling T2DM. Now insulin-dependent, using levemir and sliding scale novolog. [name] is here today and reports that pt has had poor compliance with levemir and possibly also with novolog. Pt MedHx also notable for CAD, CHF, HTN, PAD, CKD, recent BKA. CHF currently treated with fluid restriction to 40 oz. Per day.  Current HbA1c: 7.9  Preferred Learning Style:   No preference indicated   Learning Readiness:   Contemplating  Daughter controls much of cooking and home care for pt, and she is highly motivated to incorporate changes to improve her mother's health. However, the pt's choices are largely for convenience and taste.  MEDICATIONS: see list.  DIETARY INTAKE: Usual eating pattern includes 3 meals and 3 snacks per day. Everyday foods include mcdonalds, oatmeal or cereal, chix, fruit.  Avoided foods include none noted.    24-hr recall:  B ( AM): instant flavored oatmeal;  Water. Or cereal with 2% milk. Snk ( AM): mcdonalds- sausage and egg biscuit with sweet tea.  L ( PM): leftovers from previous meal, or [employer]- tuna sandwich on wheat with lettuce and tomato with Sprite or water with a cookie, or chinese- egg foo young with steamed rice and sauce, with water.  Snk ( PM): fruit, usually grapes, pear, or peaches. Maybe some type of granola bar.  D ( PM): usually baked chix or rotisserie chix with a salad of just lettuce and sour cream. Broccoli with cheese is a common veg. Sausages are another preferred protein. Beans are a common side.  Snk ( PM): similar to previous.  Beverages: water, sweet tea, sprite, cranberry juice, no kool aid, no gatorade, no EtOH.   Usual physical activity: was going to PT until about 2 weeks ago. Very minimal activity, has trouble with balance, and had recent BKA.   Progress Towards  Goal(s):  In progress.   Nutritional Diagnosis:  NI-5.8.2 Excessive carbohydrate intake As related to minimal physical activity, frequent consumption of carbohydrate-rich foods, particularly breads, starchy vegetables, sweet drinks.  As evidenced by pt diet recall, HgbA1c 7.9.    Intervention:  Nutrition counseling provided.  Discussed diabetes disease process and treatment options.  Discussed physiology of diabetes and role of obesity on insulin resistance.  Encouraged moderate weight reduction to improve glucose levels.  Discussed role of medications and diet in glucose control  Provided education on macronutrients on glucose levels.  Provided education on carb counting, importance of regularly scheduled meals/snacks, and meal planning  Discussed effects of physical activity on glucose levels and long-term glucose control.  Recommended 150 minutes of physical activity/week.  Reviewed patient medications.  Discussed role of medication on blood glucose and possible side effects  Discussed blood glucose monitoring and interpretation.  Discussed recommended target ranges and individual ranges.    Described short-term complications: hyper- and hypo-glycemia.  Discussed causes,symptoms, and treatment options.  Discussed prevention, detection, and treatment of long-term complications.  Discussed the role of prolonged elevated glucose levels on body systems.  Discussed role of stress on blood glucose levels and discussed strategies to manage psychosocial issues.  Discussed recommendations for long-term diabetes self-care.  Established checklist for medical, dental, and emotional self-care.  Teaching Method Utilized:  Visual Auditory  Handouts given during visit include:  Living Well with Diabetes  The Plate Method  High and Low Potassium Foods  High and Low Phosphorus Foods  Barriers to learning/adherence to lifestyle change: pt seems largely unmotivated to change; minimal  understanding or interest during session  Diabetes self-care support plan:   [facility] support group  Daughters live with mother, provide most care  RD spent most of session addressing T2DM care, with focus on portion control through the Plate Method, inclusion of more nonstarchy vegetables in particular. Pt is clearly not willing to count carbs, so little time was spent on this. Some time was dedicated to making efforts to choice more low phosphorus and low potassium options for kidney health. Pt daughter also noted a concern for protein intake, stating she was told that her mother had a high protein intake (but she has CKD without dialysis). RD specifically recommended against a very high protein diet.  RD also spent a good amount of time reinforcing the need for more activity and movement of all kinds. [name] states she wants to get her mother back into PT. In the interim, RD recommended pt do at least 20 minutes daily activity, including walking short distances, chair exercises, and whatever may be tolerable. Pt will progress 5 minutes per day after every 2 weeks of exercise with no setbacks until she achieves 60 minutes exercise per day.  Demonstrated degree of understanding via:  Teach Back   Monitoring/Evaluation:  Dietary intake, exercise, portion control, and body weight in 6 week(s)

## 2014-06-02 IMAGING — CR DG FOOT 2V*L*
2 series · 2 of 2 positions shown · non-contrast
Comparison: 05/30/2013

CLINICAL DATA: Infection, left second toe.

LEFT FOOT - 2 VIEW

[x foot ap left]
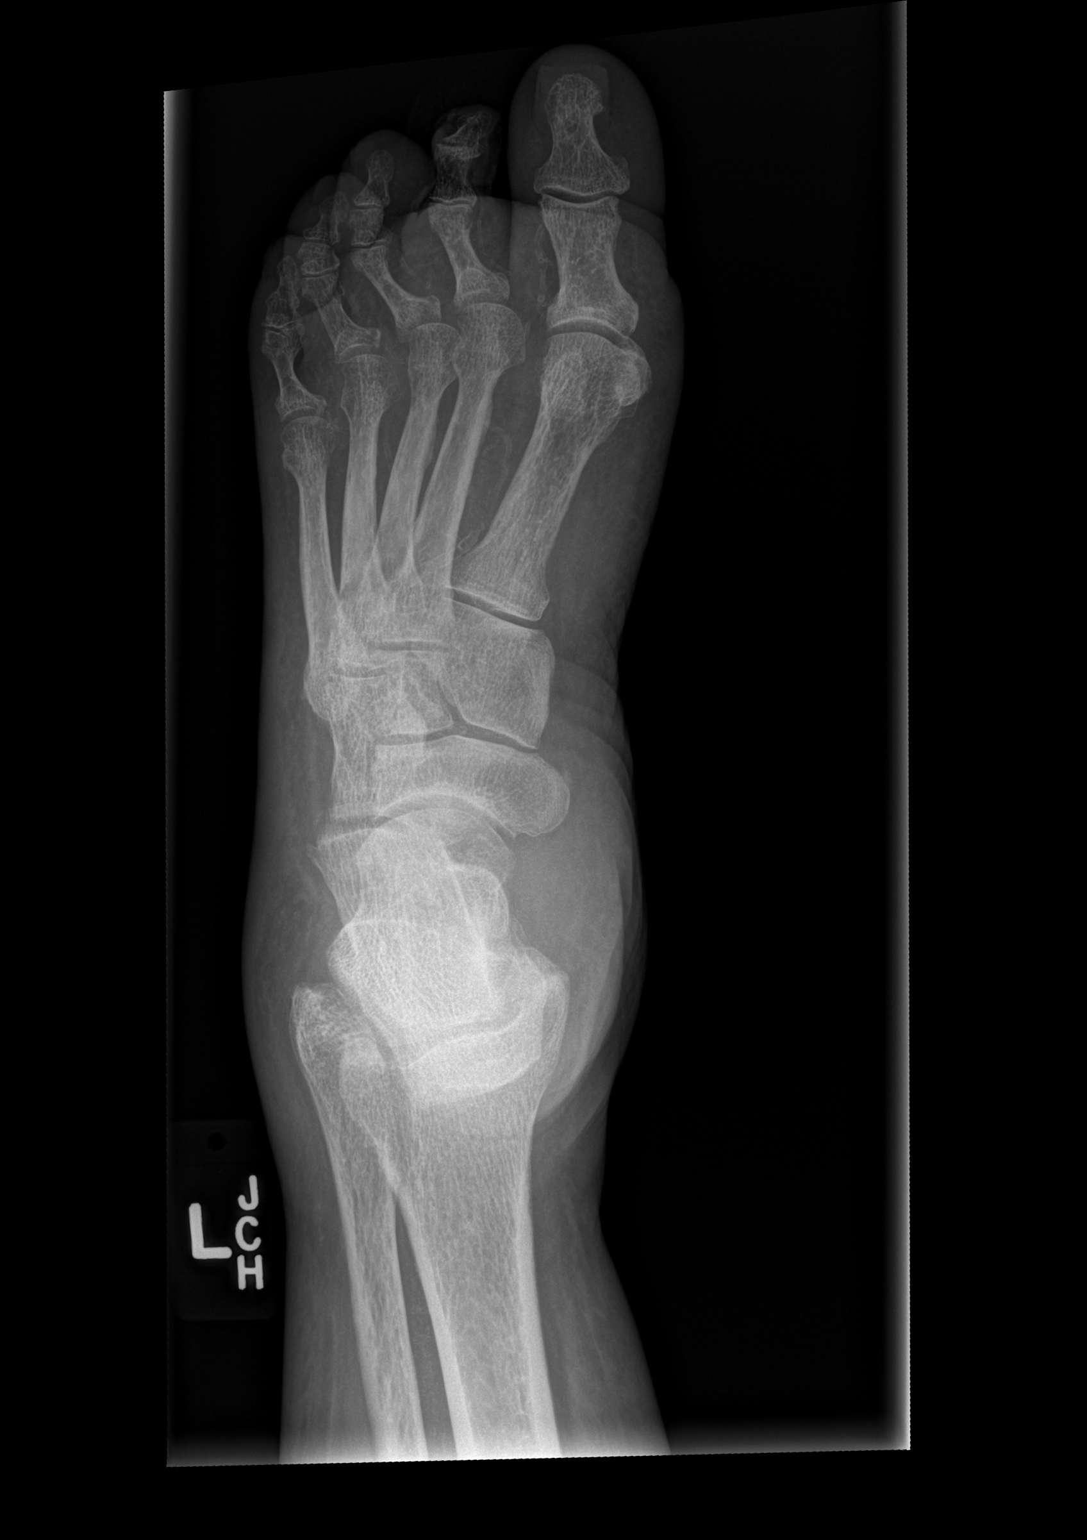

[x foot lat left]
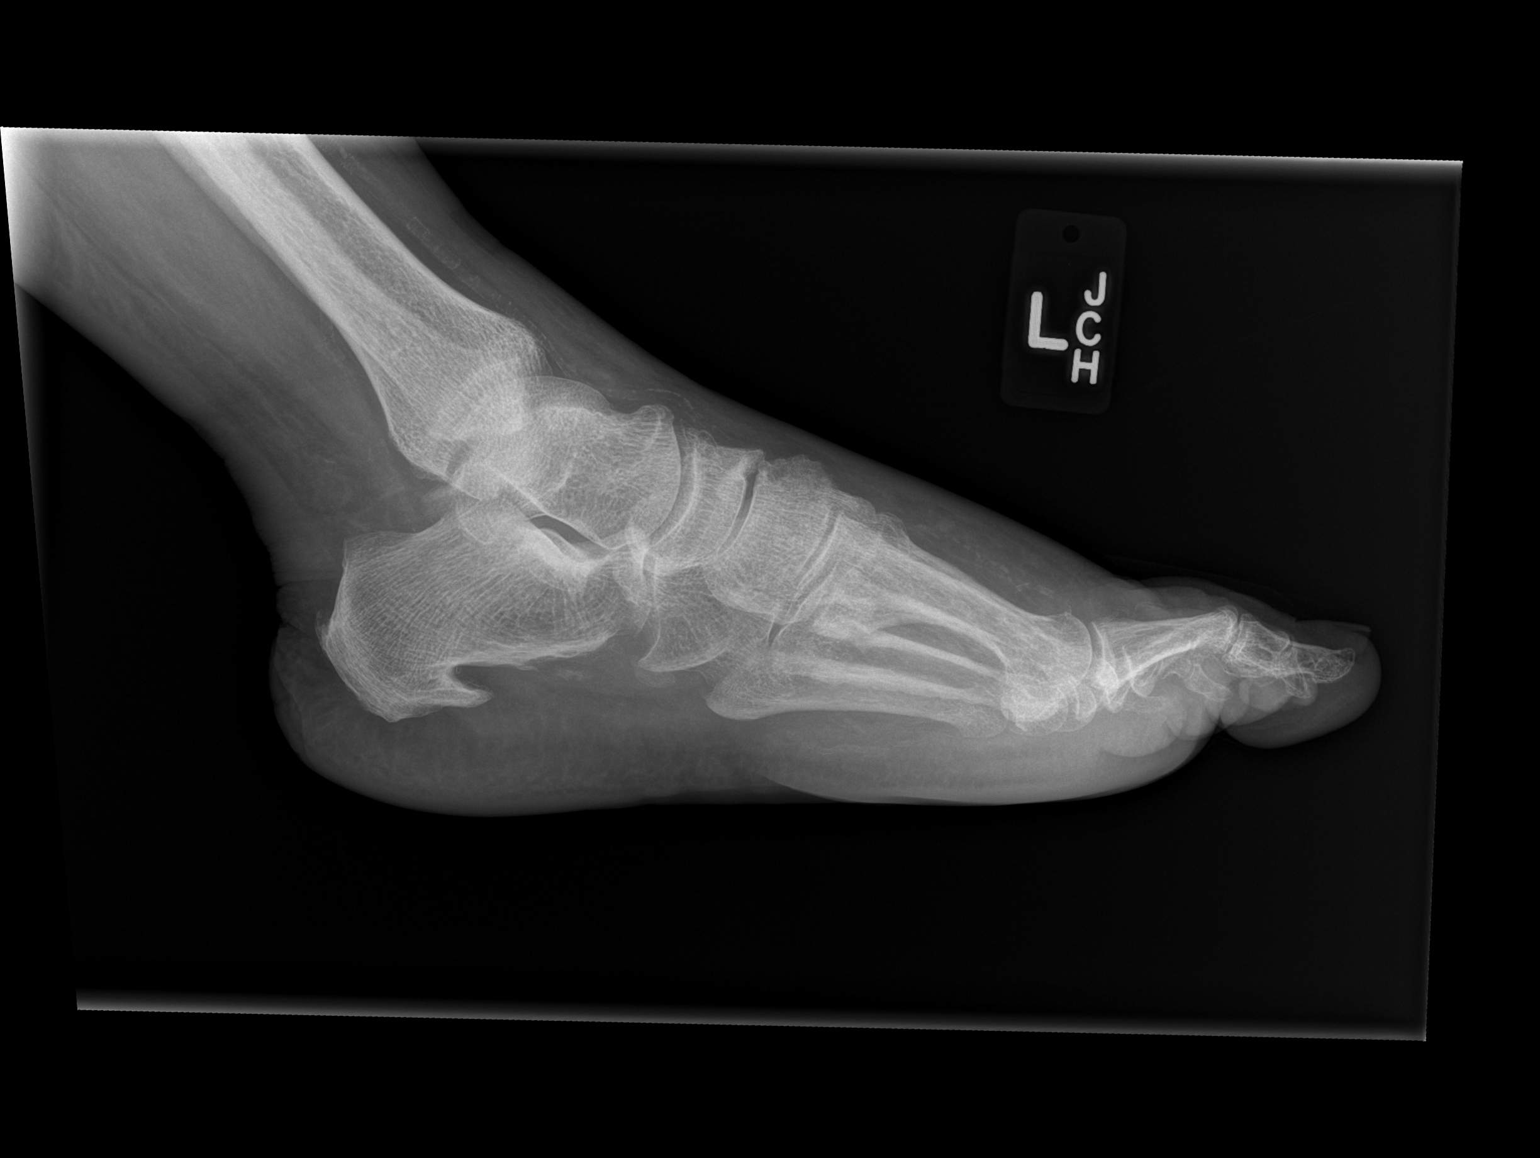

[2 of 2 positions shown; findings below may reference images not displayed]

FINDINGS: There is soft tissue loss along the mid aspect of the
left second toe similar to the prior exam.  There is no convincing
osteomyelitis.  There is no fracture or dislocation.

The bones are demineralized.  There is a large plantar calcaneal
spur.  The soft tissues show fairly extensive vascular
calcifications.
IMPRESSION: No radiographic evidence of osteomyelitis.  No fracture or acute
bony abnormality.

## 2014-06-23 ENCOUNTER — Emergency Department (HOSPITAL_COMMUNITY): Payer: Medicare Other

## 2014-06-23 ENCOUNTER — Inpatient Hospital Stay (HOSPITAL_COMMUNITY)
Admission: EM | Admit: 2014-06-23 | Discharge: 2014-06-27 | DRG: 291 | Disposition: A | Discharge status: Home / Self Care | Payer: Medicare Other | Attending: Internal Medicine | Admitting: Internal Medicine

## 2014-06-23 ENCOUNTER — Encounter (HOSPITAL_COMMUNITY): Payer: Self-pay | Admitting: Emergency Medicine

## 2014-06-23 DIAGNOSIS — Z9861 Coronary angioplasty status: Secondary | ICD-10-CM | POA: Diagnosis not present

## 2014-06-23 DIAGNOSIS — E785 Hyperlipidemia, unspecified: Secondary | ICD-10-CM

## 2014-06-23 DIAGNOSIS — I5033 Acute on chronic diastolic (congestive) heart failure: Secondary | ICD-10-CM

## 2014-06-23 DIAGNOSIS — N058 Unspecified nephritic syndrome with other morphologic changes: Secondary | ICD-10-CM | POA: Diagnosis present

## 2014-06-23 DIAGNOSIS — I10 Essential (primary) hypertension: Secondary | ICD-10-CM

## 2014-06-23 DIAGNOSIS — N179 Acute kidney failure, unspecified: Secondary | ICD-10-CM

## 2014-06-23 DIAGNOSIS — I5031 Acute diastolic (congestive) heart failure: Secondary | ICD-10-CM

## 2014-06-23 DIAGNOSIS — Z87891 Personal history of nicotine dependence: Secondary | ICD-10-CM | POA: Diagnosis not present

## 2014-06-23 DIAGNOSIS — I739 Peripheral vascular disease, unspecified: Secondary | ICD-10-CM | POA: Diagnosis present

## 2014-06-23 DIAGNOSIS — E1149 Type 2 diabetes mellitus with other diabetic neurological complication: Secondary | ICD-10-CM | POA: Diagnosis present

## 2014-06-23 DIAGNOSIS — Z8673 Personal history of transient ischemic attack (TIA), and cerebral infarction without residual deficits: Secondary | ICD-10-CM | POA: Diagnosis not present

## 2014-06-23 DIAGNOSIS — I252 Old myocardial infarction: Secondary | ICD-10-CM

## 2014-06-23 DIAGNOSIS — S88119A Complete traumatic amputation at level between knee and ankle, unspecified lower leg, initial encounter: Secondary | ICD-10-CM

## 2014-06-23 DIAGNOSIS — D638 Anemia in other chronic diseases classified elsewhere: Secondary | ICD-10-CM

## 2014-06-23 DIAGNOSIS — F329 Major depressive disorder, single episode, unspecified: Secondary | ICD-10-CM | POA: Diagnosis present

## 2014-06-23 DIAGNOSIS — E1142 Type 2 diabetes mellitus with diabetic polyneuropathy: Secondary | ICD-10-CM | POA: Diagnosis present

## 2014-06-23 DIAGNOSIS — E1129 Type 2 diabetes mellitus with other diabetic kidney complication: Secondary | ICD-10-CM | POA: Diagnosis present

## 2014-06-23 DIAGNOSIS — N189 Chronic kidney disease, unspecified: Secondary | ICD-10-CM

## 2014-06-23 DIAGNOSIS — I2510 Atherosclerotic heart disease of native coronary artery without angina pectoris: Secondary | ICD-10-CM | POA: Diagnosis present

## 2014-06-23 DIAGNOSIS — N039 Chronic nephritic syndrome with unspecified morphologic changes: Secondary | ICD-10-CM | POA: Diagnosis not present

## 2014-06-23 DIAGNOSIS — N184 Chronic kidney disease, stage 4 (severe): Secondary | ICD-10-CM

## 2014-06-23 DIAGNOSIS — E66813 Obesity, class 3: Secondary | ICD-10-CM

## 2014-06-23 DIAGNOSIS — L089 Local infection of the skin and subcutaneous tissue, unspecified: Secondary | ICD-10-CM

## 2014-06-23 DIAGNOSIS — I96 Gangrene, not elsewhere classified: Secondary | ICD-10-CM

## 2014-06-23 DIAGNOSIS — I159 Secondary hypertension, unspecified: Secondary | ICD-10-CM

## 2014-06-23 DIAGNOSIS — F3289 Other specified depressive episodes: Secondary | ICD-10-CM | POA: Diagnosis present

## 2014-06-23 DIAGNOSIS — I509 Heart failure, unspecified: Secondary | ICD-10-CM

## 2014-06-23 DIAGNOSIS — Z6841 Body Mass Index (BMI) 40.0 and over, adult: Secondary | ICD-10-CM

## 2014-06-23 DIAGNOSIS — D631 Anemia in chronic kidney disease: Secondary | ICD-10-CM

## 2014-06-23 DIAGNOSIS — Z7982 Long term (current) use of aspirin: Secondary | ICD-10-CM

## 2014-06-23 DIAGNOSIS — I5043 Acute on chronic combined systolic (congestive) and diastolic (congestive) heart failure: Secondary | ICD-10-CM | POA: Diagnosis present

## 2014-06-23 DIAGNOSIS — E871 Hypo-osmolality and hyponatremia: Secondary | ICD-10-CM

## 2014-06-23 DIAGNOSIS — Z794 Long term (current) use of insulin: Secondary | ICD-10-CM

## 2014-06-23 DIAGNOSIS — D62 Acute posthemorrhagic anemia: Secondary | ICD-10-CM

## 2014-06-23 DIAGNOSIS — I130 Hypertensive heart and chronic kidney disease with heart failure and stage 1 through stage 4 chronic kidney disease, or unspecified chronic kidney disease: Secondary | ICD-10-CM | POA: Diagnosis not present

## 2014-06-23 DIAGNOSIS — N390 Urinary tract infection, site not specified: Secondary | ICD-10-CM

## 2014-06-23 DIAGNOSIS — N289 Disorder of kidney and ureter, unspecified: Secondary | ICD-10-CM

## 2014-06-23 DIAGNOSIS — E1101 Type 2 diabetes mellitus with hyperosmolarity with coma: Secondary | ICD-10-CM

## 2014-06-23 DIAGNOSIS — R0602 Shortness of breath: Secondary | ICD-10-CM | POA: Diagnosis not present

## 2014-06-23 DIAGNOSIS — E1165 Type 2 diabetes mellitus with hyperglycemia: Secondary | ICD-10-CM

## 2014-06-23 DIAGNOSIS — K5900 Constipation, unspecified: Secondary | ICD-10-CM

## 2014-06-23 DIAGNOSIS — R51 Headache: Secondary | ICD-10-CM

## 2014-06-23 DIAGNOSIS — I517 Cardiomegaly: Secondary | ICD-10-CM

## 2014-06-23 DIAGNOSIS — E1159 Type 2 diabetes mellitus with other circulatory complications: Secondary | ICD-10-CM

## 2014-06-23 DIAGNOSIS — I150 Renovascular hypertension: Secondary | ICD-10-CM

## 2014-06-23 LAB — DIFFERENTIAL
BASOS ABS: 0 10*3/uL (ref 0.0–0.1)
Basophils Relative: 0 % (ref 0–1)
EOS ABS: 0.3 10*3/uL (ref 0.0–0.7)
EOS PCT: 4 % (ref 0–5)
LYMPHS PCT: 25 % (ref 12–46)
Lymphs Abs: 1.7 10*3/uL (ref 0.7–4.0)
Monocytes Absolute: 0.6 10*3/uL (ref 0.1–1.0)
Monocytes Relative: 9 % (ref 3–12)
Neutro Abs: 4.1 10*3/uL (ref 1.7–7.7)
Neutrophils Relative %: 61 % (ref 43–77)

## 2014-06-23 LAB — COMPREHENSIVE METABOLIC PANEL
ALT: 12 U/L (ref 0–35)
AST: 12 U/L (ref 0–37)
Albumin: 2.9 g/dL — ABNORMAL LOW (ref 3.5–5.2)
Alkaline Phosphatase: 136 U/L — ABNORMAL HIGH (ref 39–117)
Anion gap: 10 (ref 5–15)
BUN: 43 mg/dL — ABNORMAL HIGH (ref 6–23)
CALCIUM: 9.3 mg/dL (ref 8.4–10.5)
CO2: 22 mEq/L (ref 19–32)
CREATININE: 2.45 mg/dL — AB (ref 0.50–1.10)
Chloride: 111 mEq/L (ref 96–112)
GFR calc Af Amer: 23 mL/min — ABNORMAL LOW (ref >90)
GFR, EST NON AFRICAN AMERICAN: 20 mL/min — AB (ref >90)
Glucose, Bld: 157 mg/dL — ABNORMAL HIGH (ref 70–99)
Potassium: 4.8 mEq/L (ref 3.7–5.3)
Sodium: 143 mEq/L (ref 137–147)
TOTAL PROTEIN: 7.2 g/dL (ref 6.0–8.3)
Total Bilirubin: 0.3 mg/dL (ref 0.3–1.2)

## 2014-06-23 LAB — CBC
HCT: 32.6 % — ABNORMAL LOW (ref 36.0–46.0)
Hemoglobin: 10.3 g/dL — ABNORMAL LOW (ref 12.0–15.0)
MCH: 27.6 pg (ref 26.0–34.0)
MCHC: 31.6 g/dL (ref 30.0–36.0)
MCV: 87.4 fL (ref 78.0–100.0)
PLATELETS: 231 10*3/uL (ref 150–400)
RBC: 3.73 MIL/uL — ABNORMAL LOW (ref 3.87–5.11)
RDW: 16.2 % — ABNORMAL HIGH (ref 11.5–15.5)
WBC: 6.5 10*3/uL (ref 4.0–10.5)

## 2014-06-23 LAB — I-STAT CHEM 8, ED
BUN: 41 mg/dL — ABNORMAL HIGH (ref 6–23)
Calcium, Ion: 1.29 mmol/L (ref 1.13–1.30)
Chloride: 114 mEq/L — ABNORMAL HIGH (ref 96–112)
Creatinine, Ser: 2.5 mg/dL — ABNORMAL HIGH (ref 0.50–1.10)
Glucose, Bld: 160 mg/dL — ABNORMAL HIGH (ref 70–99)
HEMATOCRIT: 33 % — AB (ref 36.0–46.0)
Hemoglobin: 11.2 g/dL — ABNORMAL LOW (ref 12.0–15.0)
POTASSIUM: 4.4 meq/L (ref 3.7–5.3)
Sodium: 144 mEq/L (ref 137–147)
TCO2: 20 mmol/L (ref 0–100)

## 2014-06-23 LAB — GLUCOSE, CAPILLARY: GLUCOSE-CAPILLARY: 208 mg/dL — AB (ref 70–99)

## 2014-06-23 LAB — PRO B NATRIURETIC PEPTIDE
PRO B NATRI PEPTIDE: 1574 pg/mL — AB (ref 0–125)
PRO B NATRI PEPTIDE: 1689 pg/mL — AB (ref 0–125)

## 2014-06-23 LAB — PROTIME-INR
INR: 1.09 (ref 0.00–1.49)
PROTHROMBIN TIME: 14.1 s (ref 11.6–15.2)

## 2014-06-23 LAB — MAGNESIUM: Magnesium: 2.4 mg/dL (ref 1.5–2.5)

## 2014-06-23 LAB — TSH: TSH: 4.68 u[IU]/mL — AB (ref 0.350–4.500)

## 2014-06-23 LAB — TROPONIN I: Troponin I: <0.3 ng/mL (ref <0.30)

## 2014-06-23 LAB — APTT: APTT: 28 s (ref 24–37)

## 2014-06-23 LAB — HEMOGLOBIN A1C
Hgb A1c MFr Bld: 9 % — ABNORMAL HIGH (ref <5.7)
MEAN PLASMA GLUCOSE: 212 mg/dL — AB (ref <117)

## 2014-06-23 LAB — PHOSPHORUS: Phosphorus: 3.4 mg/dL (ref 2.3–4.6)

## 2014-06-23 MED ORDER — FUROSEMIDE 10 MG/ML IJ SOLN
40.0000 mg | Freq: Every day | INTRAMUSCULAR | Status: DC
  Administered 2014-06-23: 40 mg via INTRAVENOUS
  Filled 2014-06-23: qty 4

## 2014-06-23 MED ORDER — HYDRALAZINE HCL 20 MG/ML IJ SOLN
10.0000 mg | Freq: Four times a day (QID) | INTRAMUSCULAR | Status: DC
  Administered 2014-06-23 – 2014-06-24 (×5): 10 mg via INTRAVENOUS
  Filled 2014-06-23: qty 0.5
  Filled 2014-06-23 (×4): qty 1
  Filled 2014-06-23: qty 0.5
  Filled 2014-06-23: qty 1
  Filled 2014-06-23 (×2): qty 0.5

## 2014-06-23 MED ORDER — FUROSEMIDE 10 MG/ML IJ SOLN: 80.0000 mg | Freq: Once | INTRAMUSCULAR | Status: AC

## 2014-06-23 MED ORDER — ENOXAPARIN SODIUM 40 MG/0.4ML ~~LOC~~ SOLN
40.0000 mg | SUBCUTANEOUS | Status: DC
  Administered 2014-06-23: 40 mg via SUBCUTANEOUS
  Filled 2014-06-23 (×2): qty 0.4

## 2014-06-23 MED ORDER — FERROUS SULFATE 325 (65 FE) MG PO TABS
325.0000 mg | ORAL_TABLET | Freq: Every day | ORAL | Status: DC
  Administered 2014-06-24 – 2014-06-27 (×4): 325 mg via ORAL
  Filled 2014-06-23 (×5): qty 1

## 2014-06-23 MED ORDER — GABAPENTIN 100 MG PO CAPS
100.0000 mg | ORAL_CAPSULE | Freq: Two times a day (BID) | ORAL | Status: DC
  Administered 2014-06-23 – 2014-06-27 (×8): 100 mg via ORAL
  Filled 2014-06-23 (×9): qty 1

## 2014-06-23 MED ORDER — FUROSEMIDE 10 MG/ML IJ SOLN
80.0000 mg | Freq: Two times a day (BID) | INTRAMUSCULAR | Status: DC
  Administered 2014-06-24 – 2014-06-25 (×3): 80 mg via INTRAVENOUS
  Filled 2014-06-23 (×5): qty 8

## 2014-06-23 MED ORDER — INSULIN ASPART 100 UNIT/ML ~~LOC~~ SOLN
0.0000 [IU] | Freq: Three times a day (TID) | SUBCUTANEOUS | Status: DC
  Administered 2014-06-24: 5 [IU] via SUBCUTANEOUS
  Administered 2014-06-24: 2 [IU] via SUBCUTANEOUS
  Administered 2014-06-24: 3 [IU] via SUBCUTANEOUS
  Administered 2014-06-25: 2 [IU] via SUBCUTANEOUS
  Administered 2014-06-25 (×2): 3 [IU] via SUBCUTANEOUS
  Administered 2014-06-26 (×2): 2 [IU] via SUBCUTANEOUS
  Administered 2014-06-26: 3 [IU] via SUBCUTANEOUS
  Administered 2014-06-27: 2 [IU] via SUBCUTANEOUS
  Administered 2014-06-27: 5 [IU] via SUBCUTANEOUS

## 2014-06-23 MED ORDER — ATORVASTATIN CALCIUM 40 MG PO TABS
40.0000 mg | ORAL_TABLET | Freq: Every day | ORAL | Status: DC
  Administered 2014-06-24 – 2014-06-26 (×3): 40 mg via ORAL
  Filled 2014-06-23 (×4): qty 1

## 2014-06-23 MED ORDER — SODIUM CHLORIDE 0.9 % IV SOLN
INTRAVENOUS | Status: DC
  Administered 2014-06-23: 19:00:00 via INTRAVENOUS

## 2014-06-23 MED ORDER — ONDANSETRON HCL 4 MG PO TABS: 4.0000 mg | ORAL_TABLET | Freq: Four times a day (QID) | ORAL | Status: DC | PRN

## 2014-06-23 MED ORDER — ACETAMINOPHEN 650 MG RE SUPP: 650.0000 mg | Freq: Four times a day (QID) | RECTAL | Status: DC | PRN

## 2014-06-23 MED ORDER — HYDROCODONE-ACETAMINOPHEN 5-325 MG PO TABS: 1.0000 | ORAL_TABLET | ORAL | Status: DC | PRN

## 2014-06-23 MED ORDER — PERFLUTREN LIPID MICROSPHERE
1.0000 mL | INTRAVENOUS | Status: AC | PRN
  Administered 2014-06-23: 2 mL via INTRAVENOUS
  Filled 2014-06-23: qty 10

## 2014-06-23 MED ORDER — FUROSEMIDE 10 MG/ML IJ SOLN
40.0000 mg | Freq: Once | INTRAMUSCULAR | Status: AC
  Administered 2014-06-23: 40 mg via INTRAVENOUS
  Filled 2014-06-23: qty 4

## 2014-06-23 MED ORDER — ONDANSETRON HCL 4 MG/2ML IJ SOLN: 4.0000 mg | Freq: Four times a day (QID) | INTRAMUSCULAR | Status: DC | PRN

## 2014-06-23 MED ORDER — ASPIRIN 325 MG PO TABS
325.0000 mg | ORAL_TABLET | Freq: Every day | ORAL | Status: DC
  Administered 2014-06-23 – 2014-06-27 (×5): 325 mg via ORAL
  Filled 2014-06-23 (×5): qty 1

## 2014-06-23 MED ORDER — OMEGA-3-ACID ETHYL ESTERS 1 G PO CAPS
1.0000 g | ORAL_CAPSULE | Freq: Three times a day (TID) | ORAL | Status: DC
  Administered 2014-06-23 – 2014-06-27 (×11): 1 g via ORAL
  Filled 2014-06-23 (×13): qty 1

## 2014-06-23 MED ORDER — NITROGLYCERIN 2 % TD OINT
1.0000 [in_us] | TOPICAL_OINTMENT | Freq: Once | TRANSDERMAL | Status: AC
  Administered 2014-06-23: 1 [in_us] via TOPICAL
  Filled 2014-06-23: qty 30

## 2014-06-23 MED ORDER — SODIUM CHLORIDE 0.9 % IV SOLN
Freq: Once | INTRAVENOUS | Status: AC
  Administered 2014-06-23: 03:00:00 via INTRAVENOUS

## 2014-06-23 MED ORDER — HYDRALAZINE HCL 20 MG/ML IJ SOLN: 10.0000 mg | Freq: Four times a day (QID) | INTRAMUSCULAR | Status: DC | PRN

## 2014-06-23 MED ORDER — SODIUM CHLORIDE 0.9 % IJ SOLN
3.0000 mL | Freq: Two times a day (BID) | INTRAMUSCULAR | Status: DC
  Administered 2014-06-24 – 2014-06-26 (×5): 3 mL via INTRAVENOUS

## 2014-06-23 MED ORDER — INSULIN GLARGINE 100 UNIT/ML ~~LOC~~ SOLN
30.0000 [IU] | Freq: Every day | SUBCUTANEOUS | Status: DC
  Administered 2014-06-23 – 2014-06-26 (×4): 30 [IU] via SUBCUTANEOUS
  Filled 2014-06-23 (×5): qty 0.3

## 2014-06-23 MED ORDER — BUPROPION HCL ER (XL) 150 MG PO TB24
150.0000 mg | ORAL_TABLET | Freq: Every day | ORAL | Status: DC
  Administered 2014-06-23 – 2014-06-27 (×5): 150 mg via ORAL
  Filled 2014-06-23 (×5): qty 1

## 2014-06-23 MED ORDER — ACETAMINOPHEN 325 MG PO TABS: 650.0000 mg | ORAL_TABLET | Freq: Four times a day (QID) | ORAL | Status: DC | PRN

## 2014-06-23 NOTE — ED Provider Notes (Signed)
Medical screening examination/treatment/procedure(s) were performed by non-physician practitioner and as supervising physician I was immediately available for consultation/collaboration.   EKG Interpretation   Date/Time:  Wednesday June 23 2014 07:03:05 EDT Ventricular Rate:  68 PR Interval:  181 QRS Duration: 84 QT Interval:  409 QTC Calculation: 435 R Axis:   72 Text Interpretation:  Sinus rhythm Probable left atrial enlargement  Borderline T abnormalities, diffuse leads Confirmed by Lita Mains  MD,  Zurri Rudden ([id number]) on 06/23/2014 7:09:36 AM        Julianne Rice, MD 06/23/14 [phone]

## 2014-06-23 NOTE — ED Notes (Signed)
Bed: [id number] Expected date: 06/23/14 Expected time: 1:39 AM Means of arrival: Ambulance Comments: Short of breath

## 2014-06-23 NOTE — ED Notes (Signed)
Attempted to call report.  Nurse busy. 

## 2014-06-23 NOTE — ED Notes (Signed)
Pt arrived tot the ED with a complaint of shortness of breath.  Pt states the symptoms have been persistent over a week.

## 2014-06-23 NOTE — ED Provider Notes (Signed)
CSN: [id number]     Arrival date & time 06/23/14  0156 History   First MD Initiated Contact with Patient 06/23/14 0204     Chief Complaint  Patient presents with  . Shortness of Breath     (Consider location/radiation/quality/duration/timing/severity/associated sxs/prior Treatment) HPI Comments: Patient is had 2 weeks of worsening shortness of breath lower leg swelling.  Denies fever, URI, symptoms.  Does have a history of CHF.  Has not changed any of her medications.  Sticking 40 mg of Lasix twice a day without much output.  She does not use oxygen at home.  She states, that she's had to sleep with the head of her bed elevated for the past several, days  Patient is a 63 y.o. female presenting with shortness of breath. The history is provided by the patient.  Shortness of Breath Severity:  Moderate Onset quality:  Gradual Timing:  Constant Progression:  Worsening Chronicity:  Recurrent Context: activity   Relieved by:  Nothing Worsened by:  Nothing tried Associated symptoms: no abdominal pain, no cough, no fever, no rash and no wheezing   Risk factors: obesity     Past Medical History  Diagnosis Date  . ST elevation myocardial infarction (STEMI) of inferior wall  03/2009    Ostial/proximal RCA occlusion treated with BMS stent  . CAD (coronary artery disease), native coronary artery      status post PCI to the RCA for inferior STEMI  . Cancer     endo ca  . Hypertension associated with diabetes   . Hyperlipidemia LDL goal <70   . Diabetes mellitus, type II, insulin dependent     With complications - CAD, CVA, peripheral ulcer ,  . Stroke/cerebrovascular accident  4/30/ 2014    Right-sided weakness, mostly with balance issues and only mild weakness.  . Diabetic peripheral neuropathy associated with type 2 diabetes mellitus   . Obesity, Class III, BMI 40-49.9 (morbid obesity)     BMI 43; 5' 2',  235 pounds 6.4 ounces  . CKD (chronic kidney disease), stage IV     Recent acute on  chronic exacerbation in July 2014  . PAD (peripheral artery disease)   . Diabetic foot ulcer   . Dry gangrene     Left second toe; now on Sole of L foot   Past Surgical History  Procedure Laterality Date  . Abdominal hysterectomy    . Leg tendon surgery      patient fell in a store right leg  . Leg surgery      hole in bone( during Childhood)  . Cardiac catheterization  03/31/2009    Proximal RCA thrombotic occlusion (inferior STEMI) other coronaries but in a codominant system  . Coronary angioplasty  03/31/2009    PTCA , bare-metal stent 3.5 mm x 24 mm ostium of RCA ,EF 55%  . Amputation Left 08/07/2013    Procedure: left midfoot amputation;  Surgeon: Marianna Payment, MD;  Location: [facility];  Service: Orthopedics;  Laterality: Left;  . Amputation Left 08/09/2013    Procedure: AMPUTATION BELOW KNEE;  Surgeon: Marianna Payment, MD;  Location: [facility];  Service: Orthopedics;  Laterality: Left;   Family History  Problem Relation Age of Onset  . Alcoholism Mother     Died from complications  . Alcoholism Father     Died from complications  . Heart disease      Unknown, unclear. Patient is not a good historian.  Essentially no pertinent history known  History  Substance Use Topics  . Smoking status: Former Smoker    Quit date: 04/29/1995  . Smokeless tobacco: Never Used  . Alcohol Use: No   OB History   Grav Para Term Preterm Abortions TAB SAB Ect Mult Living                 Review of Systems  Constitutional: Negative for fever.  Respiratory: Positive for shortness of breath. Negative for cough and wheezing.   Cardiovascular: Positive for leg swelling.  Gastrointestinal: Negative for abdominal pain.  Skin: Positive for wound. Negative for rash.  All other systems reviewed and are negative.     Allergies  Review of patient's allergies indicates no known allergies.  Home Medications   Prior to Admission medications   Medication Sig Start Date End Date Taking?  Authorizing Provider  aspirin 325 MG tablet Take 325 mg by mouth daily.     Historical Provider, MD  atorvastatin (LIPITOR) 40 MG tablet Take 1 tablet (40 mg total) by mouth daily at 6 PM. 03/23/13   Geradine Girt, DO  buPROPion (WELLBUTRIN XL) 150 MG 24 hr tablet Take 150 mg by mouth daily.    Historical Provider, MD  carvedilol (COREG) 25 MG tablet Take 25 mg by mouth 2 (two) times daily with a meal.    Historical Provider, MD  cefUROXime (CEFTIN) 500 MG tablet Take 1 tablet (500 mg total) by mouth 2 (two) times daily with a meal. 01/15/14   Barton Dubois, MD  ferrous sulfate 325 (65 FE) MG tablet Take 325 mg by mouth daily with breakfast.    Historical Provider, MD  furosemide (LASIX) 40 MG tablet Take 1 tablet (40 mg total) by mouth 2 (two) times daily. 01/15/14   Barton Dubois, MD  gabapentin (NEURONTIN) 100 MG capsule Take 1 capsule (100 mg total) by mouth 2 (two) times daily. 08/14/13   Shanker Kristeen Mans, MD  insulin aspart (NOVOLOG) 100 UNIT/ML injection Inject 0-15 Units into the skin 3 (three) times daily with meals. Sliding scale insulin CBG 70 - 120: 0 units: CBG 121 - 150: 2 units; CBG 151 - 200: 3 units; CBG 201 - 250: 5 units; CBG 251 - 300: 8 units;CBG 301 - 350: 11 units; CBG 351 - 400: 15 units; CBG > 400 : 15 units and notify your doctor 06/09/13   Ripudeep Krystal Eaton, MD  insulin glargine (LANTUS) 100 UNIT/ML injection Inject 30 Units into the skin at bedtime.    Historical Provider, MD  omega-3 acid ethyl esters (LOVAZA) 1 G capsule Take 1 g by mouth 3 (three) times daily.     Historical Provider, MD   BP 173/77  Pulse 66  Temp(Src) 98.3 F (36.8 C) (Oral)  Resp 18  SpO2 100% Physical Exam  Vitals reviewed. Constitutional: She appears well-developed and well-nourished.  HENT:  Head: Normocephalic.  Eyes: Pupils are equal, round, and reactive to light.  Neck: Normal range of motion.  Cardiovascular: Normal rate and regular rhythm.   Pulmonary/Chest: Effort normal and breath  sounds normal. No respiratory distress. She has no wheezes. She exhibits no tenderness.  Abdominal: Soft. She exhibits no distension. There is no tenderness.  Musculoskeletal: Normal range of motion. She exhibits edema.  Neurological: She is alert.  Skin:  Patient has a healing abrasion to the anterior portion of the right shin.  Patient has a left BKA    ED Course  Procedures (including critical care time) Labs Review Labs Reviewed  CBC -  Abnormal; Notable for the following:    RBC 3.73 (*)    Hemoglobin 10.3 (*)    HCT 32.6 (*)    RDW 16.2 (*)    All other components within normal limits  PRO B NATRIURETIC PEPTIDE - Abnormal; Notable for the following:    Pro B Natriuretic peptide (BNP) 1574.0 (*)    All other components within normal limits  TROPONIN I  COMPREHENSIVE METABOLIC PANEL  I-STAT CHEM 8, ED    Imaging Review Dg Chest 2 View  06/23/2014   CLINICAL DATA:  Shortness of breath  EXAM: CHEST  2 VIEW  COMPARISON:  01/11/2014  FINDINGS: Chronic moderate cardiomegaly. Unchanged upper mediastinal contours. Diffuse interstitial opacity which is similar to previous, likely pulmonary edema. There is fissural fluid or fissural thickening. No asymmetric opacity or pneumothorax.  IMPRESSION: Cardiomegaly and pulmonary edema.   Electronically Signed   By: Jorje Guild M.D.   On: 06/23/2014 03:19     EKG Interpretation None     Patient is hypoxic on room air to 86 with movement on 2 L of oxygen.  She sats between 90 and 100% 1 inch of nitro paste and 40 mg of Lasix were given to treat her CHF.  Her blood pressure is improving, but she does remain hypoxic, requiring oxygen MDM   Final diagnoses:  None         Garald Balding, NP 06/23/14 [phone]

## 2014-06-23 NOTE — Consult Note (Signed)
CARDIOLOGY CONSULT NOTE  Patient ID: [name], MRN: [id number], DOB/AGE: 01-07-1951 63 y.o. Admit date: 06/23/2014 Date of Consult: 06/23/2014  Primary Physician: Hayden Rasmussen., MD Primary Cardiologist: Dr Ellyn Hack  Referring Physician: Dr Charlies Silvers  Chief Complaint: Shortness of breath Reason for Consultation: CHF  HPI: 63 year-old morbidly obese woman with multiple medical problems now presents with progressive shortness of breath. 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 Other problems include Stage 4 CKD, CAD with previous inferior wall MI, Type 2 diabetes, and hypertension. Last assessment of LV function by echo showed vigorous contractility with an LVEF of 65-70%.  History was obtained from both the patient and her daughter who I spoke with on the telephone. She complains of approximately 2-3 weeks of worsening shortness of breath. There is associated orthopnea and PND. She's had increased swelling of the right leg. The patient's daughter states that she experiences shortness of breath with standing up, combing her hair, and perform a very light activities. She became concerned because of the progressive nature of her symptoms. Also noted that her mother is increasingly fatigued and sleepy almost all the time. She has not had any chest pain or pressure. She denies heart palpitations, lightheadedness, or frank syncope. She admits to no dietary changes and she claims to follow a low-sodium diet.  Medical History:  Past Medical History  Diagnosis Date  . ST elevation myocardial infarction (STEMI) of inferior wall  03/2009    Ostial/proximal RCA occlusion treated with BMS stent  . CAD (coronary artery disease), native coronary artery      status post PCI to the RCA for inferior STEMI  . Cancer     endo ca  . Hypertension associated with diabetes   . Hyperlipidemia LDL goal <70   . Diabetes mellitus, type II, insulin dependent     With complications - CAD,  CVA, peripheral ulcer ,  . Stroke/cerebrovascular accident  4/30/ 2014    Right-sided weakness, mostly with balance issues and only mild weakness.  . Diabetic peripheral neuropathy associated with type 2 diabetes mellitus   . Obesity, Class III, BMI 40-49.9 (morbid obesity)     BMI 43; 5' 2',  235 pounds 6.4 ounces  . CKD (chronic kidney disease), stage IV     Recent acute on chronic exacerbation in July 2014  . PAD (peripheral artery disease)   . Diabetic foot ulcer   . Dry gangrene     Left second toe; now on Sole of L foot      Surgical History:  Past Surgical History  Procedure Laterality Date  . Abdominal hysterectomy    . Leg tendon surgery      patient fell in a store right leg  . Leg surgery      hole in bone( during Childhood)  . Cardiac catheterization  03/31/2009    Proximal RCA thrombotic occlusion (inferior STEMI) other coronaries but in a codominant system  . Coronary angioplasty  03/31/2009    PTCA , bare-metal stent 3.5 mm x 24 mm ostium of RCA ,EF 55%  . Amputation Left 08/07/2013    Procedure: left midfoot amputation;  Surgeon: Marianna Payment, MD;  Location: [facility];  Service: Orthopedics;  Laterality: Left;  . Amputation Left 08/09/2013    Procedure: AMPUTATION BELOW KNEE;  Surgeon: Marianna Payment, MD;  Location: [facility];  Service: Orthopedics;  Laterality: Left;     Home Meds: Prior to Admission medications   Medication  Sig Start Date End Date Taking? Authorizing Provider  aspirin 325 MG tablet Take 325 mg by mouth daily.     Historical Provider, MD  atorvastatin (LIPITOR) 40 MG tablet Take 1 tablet (40 mg total) by mouth daily at 6 PM. 03/23/13   Geradine Girt, DO  buPROPion (WELLBUTRIN XL) 150 MG 24 hr tablet Take 150 mg by mouth daily.    Historical Provider, MD  carvedilol (COREG) 25 MG tablet Take 25 mg by mouth 2 (two) times daily with a meal.    Historical Provider, MD  cefUROXime (CEFTIN) 500 MG tablet Take 1 tablet (500 mg total) by mouth 2 (two)  times daily with a meal. 01/15/14   Barton Dubois, MD  ferrous sulfate 325 (65 FE) MG tablet Take 325 mg by mouth daily with breakfast.    Historical Provider, MD  furosemide (LASIX) 40 MG tablet Take 1 tablet (40 mg total) by mouth 2 (two) times daily. 01/15/14   Barton Dubois, MD  gabapentin (NEURONTIN) 100 MG capsule Take 1 capsule (100 mg total) by mouth 2 (two) times daily. 08/14/13   Shanker Kristeen Mans, MD  insulin aspart (NOVOLOG) 100 UNIT/ML injection Inject 0-15 Units into the skin 3 (three) times daily with meals. Sliding scale insulin CBG 70 - 120: 0 units: CBG 121 - 150: 2 units; CBG 151 - 200: 3 units; CBG 201 - 250: 5 units; CBG 251 - 300: 8 units;CBG 301 - 350: 11 units; CBG 351 - 400: 15 units; CBG > 400 : 15 units and notify your doctor 06/09/13   Ripudeep Krystal Eaton, MD  insulin glargine (LANTUS) 100 UNIT/ML injection Inject 30 Units into the skin at bedtime.    Historical Provider, MD  omega-3 acid ethyl esters (LOVAZA) 1 G capsule Take 1 g by mouth 3 (three) times daily.     Historical Provider, MD    Inpatient Medications:  . hydrALAZINE  10 mg Intravenous Q6H      Allergies: No Known Allergies  History   Social History  . Marital Status: Widowed    Spouse Name: N/A    Number of Children: 4  . Years of Education: N/A   Occupational History  .     Social History Main Topics  . Smoking status: Former Smoker    Quit date: 04/29/1995  . Smokeless tobacco: Never Used  . Alcohol Use: No  . Drug Use: No  . Sexual Activity: No   Other Topics Concern  . Not on file   Social History Narrative   Was previously living in an assisted living center while recovering from her stroke. Lesion is now recently moved back home with her family.    She is attended by her daughter, who is super morbidly obese.              Family History  Problem Relation Age of Onset  . Alcoholism Mother     Died from complications  . Alcoholism Father     Died from complications  . Heart  disease      Unknown, unclear. Patient is not a good historian.  Essentially no pertinent history known     Review of Systems: General: negative for chills, fever, night sweats or weight changes.  ENT: negative for rhinorrhea or epistaxis Cardiovascular: See history of present illness Dermatological: negative for rash Respiratory: negative for cough or wheezing GI: negative for nausea, vomiting, diarrhea, bright red blood per rectum, melena, or hematemesis GU: no hematuria, urgency, or  frequency Neurologic: negative for visual changes, syncope, headache, or dizziness Heme: no easy bruising or bleeding Endo: negative for excessive thirst, thyroid disorder, or flushing Musculoskeletal: Positive for pain in her right leg  All other systems reviewed and are otherwise negative except as noted above.  Physical Exam: Blood pressure 195/87, pulse 66, temperature 98.3 F (36.8 C), temperature source Oral, resp. rate 18, SpO2 100.00%. Pt is alert and oriented, morbidly obese woman, in no distress. HEENT: normal Neck: JVP moderately elevated. Carotid upstrokes normal with a left carotid bruit. No thyromegaly. Lungs: Lung sounds are diminished bilaterally in the bases, otherwise clear CV: Apex is discrete and nondisplaced, RRR without murmur or gallop Abd: soft, NT, +BS, obese Back: no CVA tenderness Ext: 3+ edema on the right, left BKA noted. Skin: warm and dry without rash Neuro: CNII-XII intact               Labs:  Recent Labs  06/23/14 0606  TROPONINI <0.30   Lab Results  Component Value Date   WBC 6.5 06/23/2014   HGB 10.3* 06/23/2014   HCT 32.6* 06/23/2014   MCV 87.4 06/23/2014   PLT 231 06/23/2014    Recent Labs Lab 06/23/14 0606  NA 143  K 4.8  CL 111  CO2 22  BUN 43*  CREATININE 2.45*  CALCIUM 9.3  PROT 7.2  BILITOT 0.3  ALKPHOS 136*  ALT 12  AST 12  GLUCOSE 157*   Lab Results  Component Value Date   CHOL 187 03/19/2013   HDL 36* 03/19/2013   LDLCALC 95 03/19/2013     TRIG 282* 03/19/2013   Lab Results  Component Value Date   DDIMER 0.75* 09/25/2012    Radiology/Studies:  Dg Chest 2 View  06/23/2014   CLINICAL DATA:  Shortness of breath  EXAM: CHEST  2 VIEW  COMPARISON:  01/11/2014  FINDINGS: Chronic moderate cardiomegaly. Unchanged upper mediastinal contours. Diffuse interstitial opacity which is similar to previous, likely pulmonary edema. There is fissural fluid or fissural thickening. No asymmetric opacity or pneumothorax.  IMPRESSION: Cardiomegaly and pulmonary edema.   Electronically Signed   By: Jorje Guild M.D.   On: 06/23/2014 03:19   2D Echo 03/18/2013: Study Conclusions  - Left ventricle: The cavity size was normal. Wall thickness was increased in a pattern of mild LVH. There was focal basal hypertrophy. Systolic function was vigorous. The estimated ejection fraction was in the range of 65% to 70%. Wall motion was normal; there were no regional wall motion abnormalities. - Left atrium: The atrium was mildly dilated. - Right atrium: The atrium was mildly dilated.  EKG: NSR 68 bpm, nonspecific T wave abnormality  ASSESSMENT AND PLAN:  1. Acute on chronic diastolic congestive heart failure 2. Stage IV chronic kidney disease 3. Severe peripheral arterial disease status post amputation 4. Hypertension with evidence of hypertensive heart disease and congestive heart failure 5. Morbid obesity 6. Type 2 diabetes, insulin-dependent with complications 7. Coronary artery disease with old MI, but no signs of active ischemia  The patient has typical symptoms, exam findings, and laboratory data consistent with acute exacerbation of congestive heart failure. Will treat with IV diuretics and will need to keep a close eye on her renal function considering her advanced kidney disease. Will update her echocardiogram. Treatment of hypertension will be key. Her morbid obesity is clearly contributing significantly to all of her medical problems, but now  that she has extensive comorbid conditions including previous amputation, I do not think she will  be able to significantly impact this problem. We will follow along during her hospitalization.  Signed, Sherren Mocha MD, [facility] 06/23/2014, 9:55 AM

## 2014-06-23 NOTE — ED Provider Notes (Signed)
6:08 AM Pt signed out to me at shift change. Pt to ED with SOB, swelling. Pt evaluated NP Olean Ree. PT was found to have pulmonary edema on CXR, elevated BNP. Nitro paste and lasix ordered.  She spoke with [facility], asked for troponin. Pt also does not have BMP back yet. Will monitor.   7:18 AM pt's bp continues to be elevated. She is in no distress. Appears comfortable. ECG unchanged, trop negative. Spoke with [facility], they will admit pt.    Filed Vitals:   06/23/14 0421 06/23/14 0430 06/23/14 0500 06/23/14 0656  BP: 167/82 158/76 173/77 196/101  Pulse: 67 66 66   Temp:      TempSrc:      Resp:   18 18  SpO2: 98% 100% 100%        Florene Route Taichi Repka, PA-C 06/23/14 0719

## 2014-06-23 NOTE — ED Notes (Signed)
EKG was captured originally at the first order but did not cross over. Another EKG was captured, and was attempted several times to catch  A clear picture. Due to Pt breathing, it was difficult to capture a picture without artifact.   Pt is currently sitting up, in position of comfort, talking on the telephone.

## 2014-06-23 NOTE — H&P (Addendum)
[facility] History and Physical  [name] [id number] DOB: 06/29/1951 DOA: 06/23/2014  Referring physician: ER physician PCP: Hayden Rasmussen., MD   Chief Complaint: shortness of breath   HPI:  63 year old female with past medical history of hypertension, diabetes and associated diabetic neuropathy, CHF, dyslipidemia who presented to [facility] ED 06/23/2014 with worsening shortness of breath over past 1 week prior to this admission and getting worse in past 24 hours. Pt reported associated non productive cough but no orthopnea. She reported being weak and able to ambulate short distances prior to getting short of breath. No chest pain or palpitations. No fevers or chills. Pt took lasix at home with no significant improvement. She also reported having more swelling in lower extremities. No abdominal pain, nausea or vomiting. No reports of blood in stool or urine. No lightheadedness or loss of consciousness.  In ED, BP was 158/76 and then 203/68 which improved with hydralazine to 184/79. HR was 71, RR was 22. Blood work showed hemoglobin of 11.2, creatinine 2.50, troponin x 1 negative. The 12 lead EKG showed sinus rhythm. CXR showed cardiomegaly and pulmonary edema.   Assessment & Plan    Principal Problem:   Acute on chronic diastolic CHF (congestive heart failure), NYHA class 4  Last 2 D ECHO in 2014 showed essentially normal EF. BNP on this admission is 1689.  Pt given lasix 40 mg IV once dose in ED  Appreciate cardio consult and recommendations  Daily weight and strict intake and output. Weight on this admission 119 kg (pt did not recall baseline weight)  Replete electrolytes as needed Active Problems: Accelerated HTN (hypertension)  Currently take lasix 80 mg IV BID and hydralazine 10 mg IV every 6 hours  Appreciate cardio input Chronic kidney disease, stage IV  Creatinine 5 months ago 2.6 and on this admission 2.45  Continue to monitor renal function  Diabetes,  uncontrolled with neurologic complications  Contineu lantus 30 units at bedtime and SSI  A1c on this admission 9 indicating poor glycemic control Diabetic neuropathy   Restart gabapentin and lyrica Anemia of chronic disease  Secondary to anemia of renal disease  Hemoglobin is 10.3 on this admission  No current indications for transfusion  Dyslipidemia  Continue statin therapy and omega 3 Depression  continue Wellbutrin  DVT prophylaxis  Lovenox sub Q  Radiological Exams on Admission: Dg Chest 2 View 06/23/2014    IMPRESSION: Cardiomegaly and pulmonary edema.     EKG: sinus rhythm  Code Status: Full Family Communication: Plan of care discussed with the patient and her granddaughter at the bedside  Disposition Plan: Admit for further evaluation; initially due to accelerated BP we placed SDU order but since BP improved while in ED this was subsequently changed to tele   Leisa Lenz, MD  [facility] Pager [phone]  Review of Systems:  Constitutional: Negative for fever, chills and malaise/fatigue. Negative for diaphoresis.  HENT: Negative for hearing loss, ear pain, nosebleeds, congestion, sore throat, neck pain, tinnitus and ear discharge.   Eyes: Negative for blurred vision, double vision, photophobia, pain, discharge and redness.  Respiratory: per HPI.   Cardiovascular: Negative for chest pain, palpitations, orthopnea, claudication and leg swelling.  Gastrointestinal: Negative for nausea, vomiting and abdominal pain. Negative for heartburn, constipation, blood in stool and melena.  Genitourinary: Negative for dysuria, urgency, frequency, hematuria and flank pain.  Musculoskeletal: Negative for myalgias, back pain, joint pain and falls.  Skin: Negative for itching and rash.  Neurological: Negative for dizziness  and positive for weakness. Negative for tingling, tremors, sensory change, speech change, focal weakness, loss of consciousness and headaches.   Endo/Heme/Allergies: Negative for environmental allergies and polydipsia. Does not bruise/bleed easily.  Psychiatric/Behavioral: Negative for suicidal ideas. The patient is not nervous/anxious.      Past Medical History  Diagnosis Date  . ST elevation myocardial infarction (STEMI) of inferior wall  03/2009    Ostial/proximal RCA occlusion treated with BMS stent  . CAD (coronary artery disease), native coronary artery      status post PCI to the RCA for inferior STEMI  . Cancer     endo ca  . Hypertension associated with diabetes   . Hyperlipidemia LDL goal <70   . Diabetes mellitus, type II, insulin dependent     With complications - CAD, CVA, peripheral ulcer ,  . Stroke/cerebrovascular accident  4/30/ 2014    Right-sided weakness, mostly with balance issues and only mild weakness.  . Diabetic peripheral neuropathy associated with type 2 diabetes mellitus   . Obesity, Class III, BMI 40-49.9 (morbid obesity)     BMI 43; 5' 2',  235 pounds 6.4 ounces  . CKD (chronic kidney disease), stage IV     Recent acute on chronic exacerbation in July 2014  . PAD (peripheral artery disease)   . Diabetic foot ulcer   . Dry gangrene     Left second toe; now on Sole of L foot   Past Surgical History  Procedure Laterality Date  . Abdominal hysterectomy    . Leg tendon surgery      patient fell in a store right leg  . Leg surgery      hole in bone( during Childhood)  . Cardiac catheterization  03/31/2009    Proximal RCA thrombotic occlusion (inferior STEMI) other coronaries but in a codominant system  . Coronary angioplasty  03/31/2009    PTCA , bare-metal stent 3.5 mm x 24 mm ostium of RCA ,EF 55%  . Amputation Left 08/07/2013    Procedure: left midfoot amputation;  Surgeon: Marianna Payment, MD;  Location: [facility];  Service: Orthopedics;  Laterality: Left;  . Amputation Left 08/09/2013    Procedure: AMPUTATION BELOW KNEE;  Surgeon: Marianna Payment, MD;  Location: [facility];  Service:  Orthopedics;  Laterality: Left;   Social History:  reports that she quit smoking about 19 years ago. She has never used smokeless tobacco. She reports that she does not drink alcohol or use illicit drugs.  No Known Allergies  Family History:  Family History  Problem Relation Age of Onset  . Alcoholism Mother     Died from complications  . Alcoholism Father     Died from complications  . Heart disease      Unknown, unclear. Patient is not a good historian.  Essentially no pertinent history known     Prior to Admission medications   Medication Sig Start Date End Date Taking? Authorizing Provider  aspirin 325 MG tablet Take 325 mg by mouth every morning.    Yes Historical Provider, MD  atorvastatin (LIPITOR) 40 MG tablet Take 1 tablet (40 mg total) by mouth daily at 6 PM. 03/23/13  Yes Geradine Girt, DO  buPROPion (WELLBUTRIN XL) 150 MG 24 hr tablet Take 150 mg by mouth daily.   Yes Historical Provider, MD  carvedilol (COREG) 25 MG tablet Take 25 mg by mouth 2 (two) times daily with a meal.   Yes Historical Provider, MD  ferrous sulfate 325 (65  FE) MG tablet Take 325 mg by mouth daily with breakfast.   Yes Historical Provider, MD  furosemide (LASIX) 40 MG tablet Take 40 mg by mouth 2 (two) times daily.   Yes Historical Provider, MD  gabapentin (NEURONTIN) 100 MG capsule Take 100 mg by mouth 3 (three) times daily.   Yes Historical Provider, MD  insulin aspart (NOVOLOG) 100 UNIT/ML injection Inject 0-15 Units into the skin 3 (three) times daily with meals. Sliding scale insulin CBG 70 - 120: 0 units: CBG 121 - 150: 2 units; CBG 151 - 200: 3 units; CBG 201 - 250: 5 units; CBG 251 - 300: 8 units;CBG 301 - 350: 11 units; CBG 351 - 400: 15 units; CBG > 400 : 15 units and notify your doctor 06/09/13  Yes Ripudeep Krystal Eaton, MD  insulin glargine (LANTUS) 100 UNIT/ML injection Inject 30 Units into the skin at bedtime.   Yes Historical Provider, MD  omega-3 acid ethyl esters (LOVAZA) 1 G capsule Take 1 g  by mouth 3 (three) times daily.    Yes Historical Provider, MD  pregabalin (LYRICA) 75 MG capsule Take 75 mg by mouth 2 (two) times daily.   Yes Historical Provider, MD   Physical Exam: Filed Vitals:   06/23/14 [id number] 06/23/14 0739 06/23/14 0954 06/23/14 1340  BP: 196/101 195/87 184/66 184/79  Pulse:   77 74  Temp:   98.7 F (37.1 C) 98 F (36.7 C)  TempSrc:   Oral Oral  Resp: 18  24 20   Height:    5\' 2"  (1.575 m)  Weight:    119.296 kg (263 lb)  SpO2:   99% 100%    Physical Exam  Constitutional: Appears well-developed and well-nourished. No distress.  HENT: Normocephalic. No tonsillar erythema or exudates Eyes: Conjunctivae and EOM are normal. PERRLA, no scleral icterus.  Neck: Normal ROM. Neck supple. No JVD. No tracheal deviation. No thyromegaly.  CVS: RRR, S1/S2 +, no murmurs, no gallops, no carotid bruit.  Pulmonary: Effort and breath sounds normal, no stridor, rhonchi, wheezes, rales.  Abdominal: Soft. BS +,  no distension, tenderness, rebound or guarding.  Musculoskeletal: Normal range of motion. No edema and no tenderness.  Lymphadenopathy: No lymphadenopathy noted, cervical, inguinal. Neuro: Alert. Normal reflexes, muscle tone coordination. No focal neurologic deficits. Skin: Skin is warm and dry. No rash noted. Not diaphoretic. No erythema. No pallor.  Psychiatric: Normal mood and affect. Behavior, judgment, thought content normal.   Labs on Admission:  Basic Metabolic Panel:  Recent Labs Lab 06/23/14 0314 06/23/14 0606 06/23/14 0926  NA 144 143  --   K 4.4 4.8  --   CL 114* 111  --   CO2  --  22  --   GLUCOSE 160* 157*  --   BUN 41* 43*  --   CREATININE 2.50* 2.45*  --   CALCIUM  --  9.3  --   MG  --   --  2.4  PHOS  --   --  3.4   Liver Function Tests:  Recent Labs Lab 06/23/14 0606  AST 12  ALT 12  ALKPHOS 136*  BILITOT 0.3  PROT 7.2  ALBUMIN 2.9*   No results found for this basename: LIPASE, AMYLASE,  in the last 168 hours No results found  for this basename: AMMONIA,  in the last 168 hours CBC:  Recent Labs Lab 06/23/14 0245 06/23/14 0314  WBC 6.5  --   NEUTROABS 4.1  --   HGB 10.3* 11.2*  HCT 32.6* 33.0*  MCV 87.4  --   PLT 231  --    Cardiac Enzymes:  Recent Labs Lab 06/23/14 0606  TROPONINI <0.30   BNP: No components found with this basename: POCBNP,  CBG: No results found for this basename: GLUCAP,  in the last 168 hours  If 7PM-7AM, please contact night-coverage [email or web address] Password [email or web address] 06/23/2014, 3:06 PM

## 2014-06-23 NOTE — ED Notes (Signed)
Called report to [name], RN but nurse has concerns about pt's appropriateness for ICU.  Paging doctor.

## 2014-06-24 DIAGNOSIS — I509 Heart failure, unspecified: Secondary | ICD-10-CM

## 2014-06-24 DIAGNOSIS — I5033 Acute on chronic diastolic (congestive) heart failure: Secondary | ICD-10-CM

## 2014-06-24 DIAGNOSIS — E1129 Type 2 diabetes mellitus with other diabetic kidney complication: Secondary | ICD-10-CM

## 2014-06-24 DIAGNOSIS — E1165 Type 2 diabetes mellitus with hyperglycemia: Secondary | ICD-10-CM

## 2014-06-24 DIAGNOSIS — I158 Other secondary hypertension: Secondary | ICD-10-CM

## 2014-06-24 DIAGNOSIS — N184 Chronic kidney disease, stage 4 (severe): Secondary | ICD-10-CM

## 2014-06-24 LAB — COMPREHENSIVE METABOLIC PANEL
ALBUMIN: 2.6 g/dL — AB (ref 3.5–5.2)
ALT: 11 U/L (ref 0–35)
AST: 9 U/L (ref 0–37)
Alkaline Phosphatase: 123 U/L — ABNORMAL HIGH (ref 39–117)
Anion gap: 11 (ref 5–15)
BUN: 48 mg/dL — ABNORMAL HIGH (ref 6–23)
CALCIUM: 8.9 mg/dL (ref 8.4–10.5)
CO2: 22 mEq/L (ref 19–32)
Chloride: 109 mEq/L (ref 96–112)
Creatinine, Ser: 2.82 mg/dL — ABNORMAL HIGH (ref 0.50–1.10)
GFR calc Af Amer: 20 mL/min — ABNORMAL LOW (ref >90)
GFR calc non Af Amer: 17 mL/min — ABNORMAL LOW (ref >90)
GLUCOSE: 161 mg/dL — AB (ref 70–99)
Potassium: 4.4 mEq/L (ref 3.7–5.3)
SODIUM: 142 meq/L (ref 137–147)
TOTAL PROTEIN: 6.8 g/dL (ref 6.0–8.3)
Total Bilirubin: 0.3 mg/dL (ref 0.3–1.2)

## 2014-06-24 LAB — GLUCOSE, CAPILLARY
GLUCOSE-CAPILLARY: 202 mg/dL — AB (ref 70–99)
GLUCOSE-CAPILLARY: 132 mg/dL — AB (ref 70–99)
GLUCOSE-CAPILLARY: 182 mg/dL — AB (ref 70–99)
Glucose-Capillary: 175 mg/dL — ABNORMAL HIGH (ref 70–99)

## 2014-06-24 LAB — CBC
HEMATOCRIT: 29.3 % — AB (ref 36.0–46.0)
HEMOGLOBIN: 9 g/dL — AB (ref 12.0–15.0)
MCH: 27.1 pg (ref 26.0–34.0)
MCHC: 30.7 g/dL (ref 30.0–36.0)
MCV: 88.3 fL (ref 78.0–100.0)
Platelets: 227 10*3/uL (ref 150–400)
RBC: 3.32 MIL/uL — AB (ref 3.87–5.11)
RDW: 16.3 % — ABNORMAL HIGH (ref 11.5–15.5)
WBC: 6.9 10*3/uL (ref 4.0–10.5)

## 2014-06-24 MED ORDER — ENOXAPARIN SODIUM 30 MG/0.3ML ~~LOC~~ SOLN
30.0000 mg | SUBCUTANEOUS | Status: DC
  Administered 2014-06-24 – 2014-06-26 (×3): 30 mg via SUBCUTANEOUS
  Filled 2014-06-24 (×4): qty 0.3

## 2014-06-24 MED ORDER — HYDRALAZINE HCL 25 MG PO TABS
25.0000 mg | ORAL_TABLET | Freq: Four times a day (QID) | ORAL | Status: DC
  Administered 2014-06-24 – 2014-06-25 (×4): 25 mg via ORAL
  Filled 2014-06-24 (×8): qty 1

## 2014-06-24 NOTE — Progress Notes (Signed)
SUBJECTIVE:  Breathing better.  No distress.   PHYSICAL EXAM Filed Vitals:   06/23/14 2041 06/24/14 0216 06/24/14 0500 06/24/14 0740  BP: 158/65 139/62 134/66   Pulse: 79  78   Temp: 98.7 F (37.1 C)  98.8 F (37.1 C)   TempSrc: Oral  Oral   Resp: 20  18   Height:      Weight:    271 lb 12.8 oz (123.288 kg)  SpO2: 100%  99%    General:  No distress Lungs:  Clear Heart:  RRR Abdomen:  Positive bowel sounds, no rebound no guarding Extremities:  Mild/mod edema   LABS: Lab Results  Component Value Date   TROPONINI <0.30 06/23/2014   Results for orders placed during the hospital encounter of 06/23/14 (from the past 24 hour(s))  MAGNESIUM     Status: None   Collection Time    06/23/14  9:26 AM      Result Value Ref Range   Magnesium 2.4  1.5 - 2.5 mg/dL  PHOSPHORUS     Status: None   Collection Time    06/23/14  9:26 AM      Result Value Ref Range   Phosphorus 3.4  2.3 - 4.6 mg/dL  APTT     Status: None   Collection Time    06/23/14  9:26 AM      Result Value Ref Range   aPTT 28  24 - 37 seconds  PROTIME-INR     Status: None   Collection Time    06/23/14  9:26 AM      Result Value Ref Range   Prothrombin Time 14.1  11.6 - 15.2 seconds   INR 1.09  0.00 - 1.49  PRO B NATRIURETIC PEPTIDE     Status: Abnormal   Collection Time    06/23/14  9:26 AM      Result Value Ref Range   Pro B Natriuretic peptide (BNP) 1689.0 (*) 0 - 125 pg/mL  TSH     Status: Abnormal   Collection Time    06/23/14  9:26 AM      Result Value Ref Range   TSH 4.680 (*) 0.350 - 4.500 uIU/mL  HEMOGLOBIN A1C     Status: Abnormal   Collection Time    06/23/14  9:26 AM      Result Value Ref Range   Hemoglobin A1C 9.0 (*) <5.7 %   Mean Plasma Glucose 212 (*) <117 mg/dL  GLUCOSE, CAPILLARY     Status: Abnormal   Collection Time    06/23/14 10:12 PM      Result Value Ref Range   Glucose-Capillary 208 (*) 70 - 99 mg/dL   Comment 1 Notify RN    COMPREHENSIVE METABOLIC PANEL     Status:  Abnormal   Collection Time    06/24/14  3:44 AM      Result Value Ref Range   Sodium 142  137 - 147 mEq/L   Potassium 4.4  3.7 - 5.3 mEq/L   Chloride 109  96 - 112 mEq/L   CO2 22  19 - 32 mEq/L   Glucose, Bld 161 (*) 70 - 99 mg/dL   BUN 48 (*) 6 - 23 mg/dL   Creatinine, Ser 2.82 (*) 0.50 - 1.10 mg/dL   Calcium 8.9  8.4 - 10.5 mg/dL   Total Protein 6.8  6.0 - 8.3 g/dL   Albumin 2.6 (*) 3.5 - 5.2 g/dL   AST 9  0 -  37 U/L   ALT 11  0 - 35 U/L   Alkaline Phosphatase 123 (*) 39 - 117 U/L   Total Bilirubin 0.3  0.3 - 1.2 mg/dL   GFR calc non Af Amer 17 (*) >90 mL/min   GFR calc Af Amer 20 (*) >90 mL/min   Anion gap 11  5 - 15  CBC     Status: Abnormal   Collection Time    06/24/14  3:44 AM      Result Value Ref Range   WBC 6.9  4.0 - 10.5 K/uL   RBC 3.32 (*) 3.87 - 5.11 MIL/uL   Hemoglobin 9.0 (*) 12.0 - 15.0 g/dL   HCT 29.3 (*) 36.0 - 46.0 %   MCV 88.3  78.0 - 100.0 fL   MCH 27.1  26.0 - 34.0 pg   MCHC 30.7  30.0 - 36.0 g/dL   RDW 16.3 (*) 11.5 - 15.5 %   Platelets 227  150 - 400 K/uL  GLUCOSE, CAPILLARY     Status: Abnormal   Collection Time    06/24/14  7:49 AM      Result Value Ref Range   Glucose-Capillary 175 (*) 70 - 99 mg/dL    Intake/Output Summary (Last 24 hours) at 06/24/14 0857 Last data filed at 06/24/14 0700  Gross per 24 hour  Intake    600 ml  Output   1150 ml  Net   -550 ml    ASSESSMENT AND PLAN:  ACUTE ON CHRONIC SYSTOLIC AND DIASTOLIC HF:   She needs to keep her foot elevated.  We discussed this.  I will continue the IV diuresis today and I suspect that we can change to PO in the AM.   CKD:  Creat is up slightly.  Follow closely  HTN:  Change to PO hydralazine.     Jeneen Rinks [facility] 06/24/2014 8:57 AM

## 2014-06-24 NOTE — Progress Notes (Signed)
PROGRESS NOTE  [name] [id number] DOB: 02/01/1951 DOA: 06/23/2014 PCP: Hayden Rasmussen., MD  Assessment/Plan: Acute on chronic diastolic CHF (congestive heart failure), NYHA class 4  Last 2 D ECHO in 2014 showed essentially normal EF. BNP on this admission is 1689.  Appreciate cardio consult and recommendations  Daily weight and strict intake and output. Weight on this admission 119 kg (pt did not recall baseline weight)  Replete electrolytes as needed  Accelerated HTN (hypertension)  Currently take lasix 80 mg IV BID and hydralazine 10 mg IV every 6 hours - change to PO lasix in AM?? Appreciate cardio input  Chronic kidney disease, stage IV  Creatinine 5 months ago 2.6 and on this admission 2.45  Continue to monitor renal function   Diabetes, uncontrolled with neurologic complications  Continue lantus 30 units at bedtime and SSI  A1c on this admission 9 indicating poor glycemic control   Diabetic neuropathy  Restart gabapentin and lyrica   Anemia of chronic disease  Secondary to anemia of renal disease  Hemoglobin is 10.3 on this admission  No current indications for transfusion    Dyslipidemia  Continue statin therapy and omega 3   Depression  continue Wellbutrin    Code Status: full Family Communication: patient and family Disposition Plan:    Consultants:  cards  Procedures:    HPI/Subjective: Feeling better "been pissin' all day"  Objective: Filed Vitals:   06/24/14 0500  BP: 134/66  Pulse: 78  Temp: 98.8 F (37.1 C)  Resp: 18    Intake/Output Summary (Last 24 hours) at 06/24/14 1009 Last data filed at 06/24/14 0700  Gross per 24 hour  Intake    600 ml  Output   1150 ml  Net   -550 ml   Filed Weights   06/23/14 1340 06/24/14 0740  Weight: 119.296 kg (263 lb) 123.288 kg (271 lb 12.8 oz)    Exam:   General:  A+Ox3, NAd  Cardiovascular: rrr  Respiratory: decreased air entry  Abdomen: +BS, soft  Musculoskeletal:  +edema   Data Reviewed: Basic Metabolic Panel:  Recent Labs Lab 06/23/14 0314 06/23/14 0606 06/23/14 0926 06/24/14 0344  NA 144 143  --  142  K 4.4 4.8  --  4.4  CL 114* 111  --  109  CO2  --  22  --  22  GLUCOSE 160* 157*  --  161*  BUN 41* 43*  --  48*  CREATININE 2.50* 2.45*  --  2.82*  CALCIUM  --  9.3  --  8.9  MG  --   --  2.4  --   PHOS  --   --  3.4  --    Liver Function Tests:  Recent Labs Lab 06/23/14 0606 06/24/14 0344  AST 12 9  ALT 12 11  ALKPHOS 136* 123*  BILITOT 0.3 0.3  PROT 7.2 6.8  ALBUMIN 2.9* 2.6*   No results found for this basename: LIPASE, AMYLASE,  in the last 168 hours No results found for this basename: AMMONIA,  in the last 168 hours CBC:  Recent Labs Lab 06/23/14 0245 06/23/14 0314 06/24/14 0344  WBC 6.5  --  6.9  NEUTROABS 4.1  --   --   HGB 10.3* 11.2* 9.0*  HCT 32.6* 33.0* 29.3*  MCV 87.4  --  88.3  PLT 231  --  227   Cardiac Enzymes:  Recent Labs Lab 06/23/14 0606  TROPONINI <0.30   BNP (last 3 results)  Recent Labs  01/15/14 0320 06/23/14 0245 06/23/14 0926  PROBNP 829.3* 1574.0* 1689.0*   CBG:  Recent Labs Lab 06/23/14 2212 06/24/14 0749  GLUCAP 208* 175*    No results found for this or any previous visit (from the past 240 hour(s)).   Studies: Dg Chest 2 View  06/23/2014   CLINICAL DATA:  Shortness of breath  EXAM: CHEST  2 VIEW  COMPARISON:  01/11/2014  FINDINGS: Chronic moderate cardiomegaly. Unchanged upper mediastinal contours. Diffuse interstitial opacity which is similar to previous, likely pulmonary edema. There is fissural fluid or fissural thickening. No asymmetric opacity or pneumothorax.  IMPRESSION: Cardiomegaly and pulmonary edema.   Electronically Signed   By: Jorje Guild M.D.   On: 06/23/2014 03:19    Scheduled Meds: . aspirin  325 mg Oral Daily  . atorvastatin  40 mg Oral q1800  . buPROPion  150 mg Oral Daily  . enoxaparin (LOVENOX) injection  30 mg Subcutaneous Q24H  .  ferrous sulfate  325 mg Oral Q breakfast  . furosemide  80 mg Intravenous BID  . gabapentin  100 mg Oral BID  . hydrALAZINE  25 mg Oral 4 times per day  . insulin aspart  0-15 Units Subcutaneous TID WC  . insulin glargine  30 Units Subcutaneous QHS  . omega-3 acid ethyl esters  1 g Oral TID  . sodium chloride  3 mL Intravenous Q12H   Continuous Infusions: . sodium chloride 10 mL/hr at 06/23/14 1831   Antibiotics Given (last 72 hours)   None      Active Problems:   Acute on chronic diastolic CHF (congestive heart failure), NYHA class 4   HTN (hypertension)    Time spent: 35 min    VANN, JESSICA  [facility] Pager [phone]. If 7PM-7AM, please contact night-coverage at [email or web address], password [email or web address] 06/24/2014, 10:09 AM  LOS: 1 day

## 2014-06-24 NOTE — Progress Notes (Signed)
CARE MANAGEMENT NOTE 06/24/2014  Patient:  [name],[name]   Account Number:  [id number]  Date Initiated:  06/24/2014  Documentation initiated by:  Olga Coaster  Subjective/Objective Assessment:   ADMITTED WITH CHF     Action/Plan:   CM FOLLOWING FOR DCP   Anticipated DC Date:  06/28/2014   Anticipated DC Plan:  [place] SERVICES/ DISEASE MANAGEMENT PROGRAM FOR CHF     DC Planning Services  CM consult        Status of service:  In process, will continue to follow Medicare Important Message given?   (If response is "NO", the following Medicare IM given date fields will be blank)  Per UR Regulation:  Reviewed for med. necessity/level of care/duration of stay  Comments:  8/6/2015Mindi Slicker RN,BSN,MHA [id number]

## 2014-06-25 LAB — GLUCOSE, CAPILLARY
GLUCOSE-CAPILLARY: 136 mg/dL — AB (ref 70–99)
GLUCOSE-CAPILLARY: 187 mg/dL — AB (ref 70–99)
Glucose-Capillary: 200 mg/dL — ABNORMAL HIGH (ref 70–99)
Glucose-Capillary: 155 mg/dL — ABNORMAL HIGH (ref 70–99)

## 2014-06-25 LAB — BASIC METABOLIC PANEL
ANION GAP: 12 (ref 5–15)
BUN: 56 mg/dL — ABNORMAL HIGH (ref 6–23)
CALCIUM: 9.2 mg/dL (ref 8.4–10.5)
CHLORIDE: 105 meq/L (ref 96–112)
CO2: 23 meq/L (ref 19–32)
Creatinine, Ser: 3.28 mg/dL — ABNORMAL HIGH (ref 0.50–1.10)
GFR calc Af Amer: 16 mL/min — ABNORMAL LOW (ref >90)
GFR calc non Af Amer: 14 mL/min — ABNORMAL LOW (ref >90)
GLUCOSE: 153 mg/dL — AB (ref 70–99)
POTASSIUM: 4.6 meq/L (ref 3.7–5.3)
SODIUM: 140 meq/L (ref 137–147)

## 2014-06-25 MED ORDER — HYDRALAZINE HCL 50 MG PO TABS
50.0000 mg | ORAL_TABLET | Freq: Three times a day (TID) | ORAL | Status: DC
  Administered 2014-06-25 – 2014-06-27 (×6): 50 mg via ORAL
  Filled 2014-06-25 (×9): qty 1

## 2014-06-25 MED ORDER — INSULIN ASPART 100 UNIT/ML ~~LOC~~ SOLN
3.0000 [IU] | Freq: Three times a day (TID) | SUBCUTANEOUS | Status: DC
  Administered 2014-06-25 – 2014-06-27 (×6): 3 [IU] via SUBCUTANEOUS

## 2014-06-25 MED ORDER — POLYETHYLENE GLYCOL 3350 17 G PO PACK
17.0000 g | PACK | Freq: Every day | ORAL | Status: DC
  Administered 2014-06-26 – 2014-06-27 (×3): 17 g via ORAL
  Filled 2014-06-25 (×3): qty 1

## 2014-06-25 NOTE — Progress Notes (Signed)
Inpatient Diabetes Program Recommendations  AACE/ADA: New Consensus Statement on Inpatient Glycemic Control (2013)  Target Ranges:  Prepandial:   less than 140 mg/dL      Peak postprandial:   less than 180 mg/dL (1-2 hours)      Critically ill patients:  140 - 180 mg/dL   Reason for Visit: Hyperglycemia  Diabetes history: DM2 Outpatient Diabetes medications: Lantus 30 units QHS and Novolog s/s Current orders for Inpatient glycemic control: Lantus 30 units QHS and Novolog moderate tidwc  Results for [name], [name] (MRN [id number]) as of 06/25/2014 16:09  Ref. Range 06/24/2014 11:33 06/24/2014 17:19 06/24/2014 21:32 06/25/2014 07:43 06/25/2014 11:49  Glucose-Capillary Latest Range: 70-99 mg/dL 202 (H) 132 (H) 182 (H) 136 (H) 200 (H)    Inpatient Diabetes Program Recommendations Insulin - Meal Coverage: Add meal coverage insulin - Novolog 3 units tidwc if pt eats >50% meal HgbA1C: 9.0% - would benefit from tighter control Outpatient Referral: OP Diabetes Education consult   Note: Will follow. Thank you. Lorenda Peck, RD, LDN, CDE Inpatient Diabetes Coordinator [phone]

## 2014-06-25 NOTE — Progress Notes (Signed)
    SUBJECTIVE:  Breathing better.  No distress.   PHYSICAL EXAM Filed Vitals:   06/24/14 2126 06/25/14 0007 06/25/14 0529 06/25/14 0604  BP: 166/74 132/61 147/57   Pulse: 75 76 71   Temp: 98.5 F (36.9 C)  98 F (36.7 C)   TempSrc: Oral  Oral   Resp: 20  20   Height:      Weight:    270 lb 4.8 oz (122.607 kg)  SpO2: 100%  100%    General:  No distress Lungs:  Clear Heart:  RRR Abdomen:  Positive bowel sounds, no rebound no guarding Extremities:  Mild/mod edema improved.   LABS:  Results for orders placed during the hospital encounter of 06/23/14 (from the past 24 hour(s))  GLUCOSE, CAPILLARY     Status: Abnormal   Collection Time    06/24/14 11:33 AM      Result Value Ref Range   Glucose-Capillary 202 (*) 70 - 99 mg/dL  GLUCOSE, CAPILLARY     Status: Abnormal   Collection Time    06/24/14  5:19 PM      Result Value Ref Range   Glucose-Capillary 132 (*) 70 - 99 mg/dL  GLUCOSE, CAPILLARY     Status: Abnormal   Collection Time    06/24/14  9:32 PM      Result Value Ref Range   Glucose-Capillary 182 (*) 70 - 99 mg/dL   Comment 1 Documented in Chart     Comment 2 Notify RN    BASIC METABOLIC PANEL     Status: Abnormal   Collection Time    06/25/14  3:53 AM      Result Value Ref Range   Sodium 140  137 - 147 mEq/L   Potassium 4.6  3.7 - 5.3 mEq/L   Chloride 105  96 - 112 mEq/L   CO2 23  19 - 32 mEq/L   Glucose, Bld 153 (*) 70 - 99 mg/dL   BUN 56 (*) 6 - 23 mg/dL   Creatinine, Ser 3.28 (*) 0.50 - 1.10 mg/dL   Calcium 9.2  8.4 - 10.5 mg/dL   GFR calc non Af Amer 14 (*) >90 mL/min   GFR calc Af Amer 16 (*) >90 mL/min   Anion gap 12  5 - 15  GLUCOSE, CAPILLARY     Status: Abnormal   Collection Time    06/25/14  7:43 AM      Result Value Ref Range   Glucose-Capillary 136 (*) 70 - 99 mg/dL   Comment 1 Notify RN     Comment 2 Documented in Chart      Intake/Output Summary (Last 24 hours) at 06/25/14 0805 Last data filed at 06/25/14 0700  Gross per 24 hour    Intake   1283 ml  Output   2500 ml  Net  -1217 ml    ASSESSMENT AND PLAN:  ACUTE ON CHRONIC SYSTOLIC AND DIASTOLIC HF:   She needs to keep her foot elevated.  We discussed this.  Given rise in creat this AM I will stop the diuretic.    CKD:  Creat is up as above.  Follow closely  HTN:  Changed to PO hydralazine yesterday.  I will change frequency and dose today.   BP is better.      Jeneen Rinks [facility] 06/25/2014 8:05 AM

## 2014-06-25 NOTE — Progress Notes (Signed)
[facility] PROGRESS NOTE  [name] [id number] DOB: [birth year] DOA: 06/23/2014 PCP: Hayden Rasmussen., MD  Brief narrative: [name] is a 63yo woman with PMH of HTN, DM/neuropathy, CHF, HLD who presented with worsening SOB X 1 week, getting worse in 24 hours prior to admission.  Associated symptoms include swelling of LE.  CXR showed cardiomegaly and pulmonary edema.  BNP was also elevated.    Assessment/Plan:   Acute on chronic diastolic CHF (congestive heart failure), NYHA class 4 - Last TTE was in 2014 and showed an essentially normal EF - She did have elevated BNP on admission to 1689 - Daily weight, strict I/O - Weight today 270, down 1 pound compared to yesterday - I/O negative by about 1 L yesterday - On IV lasix until today, she was diuresing well and her breathing is much better.  Her lasix was held today due to rising Cr.  - Cardiology following.   HTN (hypertension) - PO hydralazine and previously lasix, now held - BP's have ranged from [id number] to [id number] systolic - Add medications as needed   Type II or unspecified type diabetes mellitus with renal manifestations, uncontrolled - On insulin 30 units at night and SSI - Per DM coordinator, would add meal coverage - Add 3 units with meals - A1C elevated to 9.0    CAD (coronary artery disease), native coronary artery - No acute chest pain, CHF being treated as above.     Chronic kidney disease (CKD), stage IV (severe), acute on chronic - Cr up to 3.28 today, which has slowly risen with diuresis - Lasix held today, check renal function tomorrow.      Anemia in chronic kidney disease(285.21) - Lower today, monitor tomorrow morning.  If continues to drop, will need to evaluate for possible bleeding?  As we have been diuresing her, her H/H should be going up.  Will continue to trend.    Consultants:  Cardiology  Procedures/Studies: No results found.  Antibiotics:  none  Code Status: Full Family  Communication: Pt at bedside Disposition Plan: Home when medically stable  HPI/Subjective: No events overnight.  She is disappointed that she will not be going home today.   Objective: Filed Vitals:   06/25/14 0007 06/25/14 0529 06/25/14 0604 06/25/14 1405  BP: 132/61 147/57  129/50  Pulse: 76 71  73  Temp:  98 F (36.7 C)  98.2 F (36.8 C)  TempSrc:  Oral  Oral  Resp:  20  20  Height:      Weight:   270 lb 4.8 oz (122.607 kg)   SpO2:  100%  99%    Intake/Output Summary (Last 24 hours) at 06/25/14 1613 Last data filed at 06/25/14 1408  Gross per 24 hour  Intake   1323 ml  Output   2650 ml  Net  -1327 ml    Exam:   General:  Pt is alert, follows commands appropriately, not in acute distress  Cardiovascular: Regular rate and rhythm, S1/S2, no murmurs  Respiratory: Decreased air movement, no wheezing  Abdomen: Soft, non tender, non distended, bowel sounds present  Extremities: 1+ edema to legs bilaterally  Neuro: Grossly nonfocal  Data Reviewed: Basic Metabolic Panel:  Recent Labs Lab 06/23/14 0314 06/23/14 0606 06/23/14 0926 06/24/14 0344 06/25/14 0353  NA 144 143  --  142 140  K 4.4 4.8  --  4.4 4.6  CL 114* 111  --  109 105  CO2  --  22  --  22 23  GLUCOSE 160* 157*  --  161* 153*  BUN 41* 43*  --  48* 56*  CREATININE 2.50* 2.45*  --  2.82* 3.28*  CALCIUM  --  9.3  --  8.9 9.2  MG  --   --  2.4  --   --   PHOS  --   --  3.4  --   --    Liver Function Tests:  Recent Labs Lab 06/23/14 0606 06/24/14 0344  AST 12 9  ALT 12 11  ALKPHOS 136* 123*  BILITOT 0.3 0.3  PROT 7.2 6.8  ALBUMIN 2.9* 2.6*   No results found for this basename: LIPASE, AMYLASE,  in the last 168 hours No results found for this basename: AMMONIA,  in the last 168 hours CBC:  Recent Labs Lab 06/23/14 0245 06/23/14 0314 06/24/14 0344  WBC 6.5  --  6.9  NEUTROABS 4.1  --   --   HGB 10.3* 11.2* 9.0*  HCT 32.6* 33.0* 29.3*  MCV 87.4  --  88.3  PLT 231  --  227    Cardiac Enzymes:  Recent Labs Lab 06/23/14 0606  TROPONINI <0.30   BNP: No components found with this basename: POCBNP,  CBG:  Recent Labs Lab 06/24/14 1133 06/24/14 1719 06/24/14 2132 06/25/14 0743 06/25/14 1149  GLUCAP 202* 132* 182* 136* 200*    No results found for this or any previous visit (from the past 240 hour(s)).   Scheduled Meds: . aspirin  325 mg Oral Daily  . atorvastatin  40 mg Oral q1800  . buPROPion  150 mg Oral Daily  . enoxaparin (LOVENOX) injection  30 mg Subcutaneous Q24H  . ferrous sulfate  325 mg Oral Q breakfast  . gabapentin  100 mg Oral BID  . hydrALAZINE  50 mg Oral 3 times per day  . insulin aspart  0-15 Units Subcutaneous TID WC  . insulin glargine  30 Units Subcutaneous QHS  . omega-3 acid ethyl esters  1 g Oral TID  . sodium chloride  3 mL Intravenous Q12H   Continuous Infusions: . sodium chloride 10 mL/hr at 06/23/14 1831     Gilles Chiquito, MD  [place] Pager [phone]  If 7PM-7AM, please contact night-coverage [email or web address] Password [email or web address] 06/25/2014, 4:13 PM   LOS: 2 days

## 2014-06-26 DIAGNOSIS — D631 Anemia in chronic kidney disease: Secondary | ICD-10-CM

## 2014-06-26 DIAGNOSIS — N039 Chronic nephritic syndrome with unspecified morphologic changes: Secondary | ICD-10-CM

## 2014-06-26 LAB — GLUCOSE, CAPILLARY
Glucose-Capillary: 139 mg/dL — ABNORMAL HIGH (ref 70–99)
Glucose-Capillary: 185 mg/dL — ABNORMAL HIGH (ref 70–99)
Glucose-Capillary: 150 mg/dL — ABNORMAL HIGH (ref 70–99)
Glucose-Capillary: 199 mg/dL — ABNORMAL HIGH (ref 70–99)

## 2014-06-26 LAB — BASIC METABOLIC PANEL
ANION GAP: 9 (ref 5–15)
ANION GAP: 10 (ref 5–15)
BUN: 69 mg/dL — ABNORMAL HIGH (ref 6–23)
BUN: 66 mg/dL — AB (ref 6–23)
CHLORIDE: 107 meq/L (ref 96–112)
CO2: 25 mEq/L (ref 19–32)
CO2: 25 mEq/L (ref 19–32)
CREATININE: 3.58 mg/dL — AB (ref 0.50–1.10)
Calcium: 9.1 mg/dL (ref 8.4–10.5)
Calcium: 8.9 mg/dL (ref 8.4–10.5)
Chloride: 106 mEq/L (ref 96–112)
Creatinine, Ser: 3.35 mg/dL — ABNORMAL HIGH (ref 0.50–1.10)
GFR calc Af Amer: 16 mL/min — ABNORMAL LOW (ref >90)
GFR calc non Af Amer: 13 mL/min — ABNORMAL LOW (ref >90)
GFR, EST AFRICAN AMERICAN: 15 mL/min — AB (ref >90)
GFR, EST NON AFRICAN AMERICAN: 14 mL/min — AB (ref >90)
Glucose, Bld: 175 mg/dL — ABNORMAL HIGH (ref 70–99)
Glucose, Bld: 185 mg/dL — ABNORMAL HIGH (ref 70–99)
POTASSIUM: 5.1 meq/L (ref 3.7–5.3)
POTASSIUM: 4.9 meq/L (ref 3.7–5.3)
Sodium: 141 mEq/L (ref 137–147)
Sodium: 141 mEq/L (ref 137–147)

## 2014-06-26 NOTE — Progress Notes (Addendum)
    SUBJECTIVE:  Breathing better.  No distress.  She wants to go home.    PHYSICAL EXAM Filed Vitals:   06/25/14 1405 06/25/14 2041 06/26/14 0453 06/26/14 0500  BP: 129/50 162/74 133/51   Pulse: 73 77 70   Temp: 98.2 F (36.8 C) 97.9 F (36.6 C) 98.9 F (37.2 C)   TempSrc: Oral Oral Oral   Resp: 20 19 18    Height:      Weight:    271 lb 3.2 oz (123.016 kg)  SpO2: 99% 100% 99%    General:  No distress Lungs:  Clear Heart:  RRR Abdomen:  Positive bowel sounds, no rebound no guarding Extremities:  Mild/mod edema improved.   LABS:  Results for orders placed during the hospital encounter of 06/23/14 (from the past 24 hour(s))  GLUCOSE, CAPILLARY     Status: Abnormal   Collection Time    06/25/14 11:49 AM      Result Value Ref Range   Glucose-Capillary 200 (*) 70 - 99 mg/dL   Comment 1 Notify RN     Comment 2 Documented in Chart    GLUCOSE, CAPILLARY     Status: Abnormal   Collection Time    06/25/14  4:57 PM      Result Value Ref Range   Glucose-Capillary 155 (*) 70 - 99 mg/dL   Comment 1 Notify RN     Comment 2 Documented in Chart    GLUCOSE, CAPILLARY     Status: Abnormal   Collection Time    06/25/14  9:37 PM      Result Value Ref Range   Glucose-Capillary 187 (*) 70 - 99 mg/dL  BASIC METABOLIC PANEL     Status: Abnormal   Collection Time    06/26/14  4:50 AM      Result Value Ref Range   Sodium 141  137 - 147 mEq/L   Potassium 5.1  3.7 - 5.3 mEq/L   Chloride 107  96 - 112 mEq/L   CO2 25  19 - 32 mEq/L   Glucose, Bld 175 (*) 70 - 99 mg/dL   BUN 69 (*) 6 - 23 mg/dL   Creatinine, Ser 3.58 (*) 0.50 - 1.10 mg/dL   Calcium 9.1  8.4 - 10.5 mg/dL   GFR calc non Af Amer 13 (*) >90 mL/min   GFR calc Af Amer 15 (*) >90 mL/min   Anion gap 9  5 - 15    Intake/Output Summary (Last 24 hours) at 06/26/14 [id number] Last data filed at 06/26/14 0759  Gross per 24 hour  Intake   1120 ml  Output   2350 ml  Net  -1230 ml    ASSESSMENT AND PLAN:  ACUTE ON CHRONIC  SYSTOLIC AND DIASTOLIC HF:   She does not seem to be overtly volume overloaded at this point and she remains off of diuretic with the increased creatinine.   No further cardiac work up.     CKD:  Creat continues to rise.  Off diuretic.  Plan per primary team     Minus Breeding 06/26/2014 8:22 AM

## 2014-06-26 NOTE — Progress Notes (Signed)
[facility] PROGRESS NOTE  Judea Palleschi [id number] DOB: 1951-05-28 DOA: 06/23/2014 PCP: Hayden Rasmussen., MD  Brief narrative: [name] is a 63yo woman with PMH of HTN, DM/neuropathy, CHF, HLD who presented with worsening SOB X 1 week, getting worse in 24 hours prior to admission.  Associated symptoms include swelling of LE.  CXR showed cardiomegaly and pulmonary edema.  BNP was also elevated.    Assessment/Plan:   Acute on chronic diastolic CHF (congestive heart failure), NYHA class 4 - Last TTE was in 2014 and showed an essentially normal EF - Elevated BNP on admission to 1689 - Daily weight, strict I/O - Weight stable at 271 off of diuretics - 3L negative during hospital stay.  - Lasix d/c'd yesterday due to rising Cr, again rose this morning, but now is coming down.   - Cardiology following.   HTN (hypertension) - PO hydralazine 50mg  q8hours, lasix held - Add medications as needed - SBP in the 160s, will likely need further therapy, defer to Cardiology   Type II or unspecified type diabetes mellitus with renal manifestations, uncontrolled - On insulin 30 units at night, 3 units with meals and SSI - CBGs 150s-200 - A1C elevated to 9.0 - Has received 8 units of SSI in last 24 hours, will maintain maintenance insulin at current dose.     CAD (coronary artery disease), native coronary artery - No acute chest pain, CHF being treated as above.     Chronic kidney disease (CKD), stage IV (severe), acute on chronic - Cr up to 3.58 today and then 3.35 off of lasix - Continue to trend, unclear when lasix will need to restarted and at what dose.     Anemia in chronic kidney disease(285.21) - Trend H/H  Consultants:  Cardiology  Procedures/Studies: No results found.  Antibiotics:  none  Code Status: Full Family Communication: Pt at bedside Disposition Plan: Home when medically stable  HPI/Subjective: No events overnight.  She would like PT to assess given  her functional status at home, which has been ordered. Patient would like to go home today, however, we discussed her renal function and medication issues and she will likely stay in hospital today.   Objective: Filed Vitals:   06/25/14 2041 06/26/14 0453 06/26/14 0500 06/26/14 1421  BP: 162/74 133/51  161/73  Pulse: 77 70  73  Temp: 97.9 F (36.6 C) 98.9 F (37.2 C)  98.2 F (36.8 C)  TempSrc: Oral Oral  Oral  Resp: 19 18  16   Height:      Weight:   271 lb 3.2 oz (123.016 kg)   SpO2: 100% 99%  99%    Intake/Output Summary (Last 24 hours) at 06/26/14 1512 Last data filed at 06/26/14 1300  Gross per 24 hour  Intake   1120 ml  Output   1600 ml  Net   -480 ml    Exam:   General:  Pt is alert, follows commands appropriately, not in acute distress  Cardiovascular: Regular rate and rhythm, S1/S2  Respiratory: Decreased air movement throughout, no wheezing  Abdomen: Soft, non tender, non distended, bowel sounds present  Extremities: 1+ edema to legs bilaterally, left leg BKA  Neuro: Grossly nonfocal  Data Reviewed: Basic Metabolic Panel:  Recent Labs Lab 06/23/14 0606 06/23/14 0926 06/24/14 0344 06/25/14 0353 06/26/14 0450 06/26/14 1404  NA 143  --  142 140 141 141  K 4.8  --  4.4 4.6 5.1 4.9  CL 111  --  109 105 107  106  CO2 22  --  22 23 25 25   GLUCOSE 157*  --  161* 153* 175* 185*  BUN 43*  --  48* 56* 69* 66*  CREATININE 2.45*  --  2.82* 3.28* 3.58* 3.35*  CALCIUM 9.3  --  8.9 9.2 9.1 8.9  MG  --  2.4  --   --   --   --   PHOS  --  3.4  --   --   --   --    Liver Function Tests:  Recent Labs Lab 06/23/14 0606 06/24/14 0344  AST 12 9  ALT 12 11  ALKPHOS 136* 123*  BILITOT 0.3 0.3  PROT 7.2 6.8  ALBUMIN 2.9* 2.6*   No results found for this basename: LIPASE, AMYLASE,  in the last 168 hours No results found for this basename: AMMONIA,  in the last 168 hours CBC:  Recent Labs Lab 06/23/14 0245 06/23/14 0314 06/24/14 0344  WBC 6.5  --  6.9   NEUTROABS 4.1  --   --   HGB 10.3* 11.2* 9.0*  HCT 32.6* 33.0* 29.3*  MCV 87.4  --  88.3  PLT 231  --  227   Cardiac Enzymes:  Recent Labs Lab 06/23/14 0606  TROPONINI <0.30   BNP: No components found with this basename: POCBNP,  CBG:  Recent Labs Lab 06/24/14 2132 06/25/14 0743 06/25/14 1149 06/25/14 1657 06/25/14 2137  GLUCAP 182* 136* 200* 155* 187*    No results found for this or any previous visit (from the past 240 hour(s)).   Scheduled Meds: . aspirin  325 mg Oral Daily  . atorvastatin  40 mg Oral q1800  . buPROPion  150 mg Oral Daily  . enoxaparin (LOVENOX) injection  30 mg Subcutaneous Q24H  . ferrous sulfate  325 mg Oral Q breakfast  . gabapentin  100 mg Oral BID  . hydrALAZINE  50 mg Oral 3 times per day  . insulin aspart  0-15 Units Subcutaneous TID WC  . insulin aspart  3 Units Subcutaneous TID WC  . insulin glargine  30 Units Subcutaneous QHS  . omega-3 acid ethyl esters  1 g Oral TID  . polyethylene glycol  17 g Oral Daily  . sodium chloride  3 mL Intravenous Q12H   Continuous Infusions: . sodium chloride 10 mL/hr at 06/23/14 1831     Gilles Chiquito, MD  [place] Pager [phone]  If 7PM-7AM, please contact night-coverage [email or web address] Password [email or web address] 06/26/2014, 3:12 PM   LOS: 3 days

## 2014-06-27 LAB — BASIC METABOLIC PANEL
ANION GAP: 8 (ref 5–15)
BUN: 69 mg/dL — AB (ref 6–23)
CO2: 24 mEq/L (ref 19–32)
Calcium: 9.2 mg/dL (ref 8.4–10.5)
Chloride: 105 mEq/L (ref 96–112)
Creatinine, Ser: 3.06 mg/dL — ABNORMAL HIGH (ref 0.50–1.10)
GFR, EST AFRICAN AMERICAN: 18 mL/min — AB (ref >90)
GFR, EST NON AFRICAN AMERICAN: 15 mL/min — AB (ref >90)
Glucose, Bld: 188 mg/dL — ABNORMAL HIGH (ref 70–99)
POTASSIUM: 5.3 meq/L (ref 3.7–5.3)
Sodium: 137 mEq/L (ref 137–147)

## 2014-06-27 MED ORDER — HYDRALAZINE HCL 50 MG PO TABS: 50.0000 mg | ORAL_TABLET | Freq: Three times a day (TID) | ORAL | Status: DC

## 2014-06-27 MED ORDER — ATORVASTATIN CALCIUM 40 MG PO TABS: 40.0000 mg | ORAL_TABLET | Freq: Every day | ORAL | Status: DC

## 2014-06-27 MED ORDER — CARVEDILOL 25 MG PO TABS: 25.0000 mg | ORAL_TABLET | Freq: Two times a day (BID) | ORAL | Status: DC

## 2014-06-27 MED ORDER — FUROSEMIDE 40 MG PO TABS: 20.0000 mg | ORAL_TABLET | Freq: Every day | ORAL | Status: DC

## 2014-06-27 MED ORDER — HYDROCODONE-ACETAMINOPHEN 5-325 MG PO TABS: 1.0000 | ORAL_TABLET | ORAL | Status: DC | PRN

## 2014-06-27 NOTE — Discharge Instructions (Signed)
Acute Kidney Injury Acute kidney injury is a disease in which there is sudden (acute) damage to the kidneys. The kidneys are 2 organs that lie on either side of the spine between the middle of the back and the front of the abdomen. The kidneys:  Remove wastes and extra water from the blood.   Produce important hormones. These help keep bones strong, regulate blood pressure, and help create red blood cells.   Balance the fluids and chemicals in the blood and tissues. A small amount of kidney damage may not cause problems, but a large amount of damage may make it difficult or impossible for the kidneys to work the way they should. Acute kidney injury may develop into long-lasting (chronic) kidney disease. It may also develop into a life-threatening disease called end-stage kidney disease. Acute kidney injury can get worse very quickly, so it should be treated right away. Early treatment may prevent other kidney diseases from developing.  CAUSES   A problem with blood flow to the kidneys. This may be caused by:   Blood loss.   Heart disease.   Severe burns.   Liver disease.  Direct damage to the kidneys. This may be caused by:  Some medicines.   A kidney infection.   Poisoning or consuming toxic substances.   A surgical wound.   A blow to the kidney area.   A problem with urine flow. This may be caused by:   Cancer.   Kidney stones.   An enlarged prostate. SYMPTOMS   Swelling (edema) of the legs, ankles, or feet.   Tiredness (lethargy).   Nausea or vomiting.   Confusion.   Problems with urination, such as:   Painful or burning feeling during urination.   Decreased urine production.   Frequent accidents in children who are potty trained.   Bloody urine.   Muscle twitches and cramps.   Shortness of breath.   Seizures.   Chest pain or pressure. Sometimes, no symptoms are present. DIAGNOSIS Acute kidney injury may be detected  and diagnosed by tests, including blood, urine, imaging, or kidney biopsy tests.  TREATMENT Treatment of acute kidney injury varies depending on the cause and severity of the kidney damage. In mild cases, no treatment may be needed. The kidneys may heal on their own. If acute kidney injury is more severe, your caregiver will treat the cause of the kidney damage, help the kidneys heal, and prevent complications from occurring. Severe cases may require a procedure to remove toxic wastes from the body (dialysis) or surgery to repair kidney damage. Surgery may involve:   Repair of a torn kidney.   Removal of an obstruction. Most of the time, you will need to stay overnight at the hospital.  HOME CARE INSTRUCTIONS:  Follow your prescribed diet.  Only take over-the-counter or prescription medicines as directed by your caregiver.  Do not take any new medicines (prescription, over-the-counter, or nutritional supplements) unless approved by your caregiver. Many medicines can worsen your kidney damage or need to have the dose adjusted.   Keep all follow-up appointments as directed by your caregiver.  Observe your condition to make sure you are healing as expected. SEEK IMMEDIATE MEDICAL CARE IF:  You are feeling ill or have severe pain in the back or side.   Your symptoms return or you have new symptoms.  You have any symptoms of end-stage kidney disease. These include:   Persistent itchiness.   Loss of appetite.   Headaches.   Abnormally dark   or light skin.  Numbness in the hands or feet.   Easy bruising.   Frequent hiccups.   Menstruation stops.   You have a fever.  You have increased urine production.  You have pain or bleeding when urinating. MAKE SURE YOU:   Understand these instructions.  Will watch your condition.  Will get help right away if you are not doing well or get worse Document Released: 05/21/2011 Document Revised: 03/02/2013 Document  Reviewed: 07/04/2012 Exit[employer] Patient Information 2015 [employer]. This information is not intended to replace advice given to you by your health care provider. Make sure you discuss any questions you have with your health care provider.  

## 2014-06-27 NOTE — Evaluation (Signed)
Physical Therapy Evaluation Patient Details Name: [name] MRN: [id number] DOB: [birth year] Today's Date: 06/27/2014   History of Present Illness  63 yo female admitted with acute on chronic HF, SOB. Hx of L BKA 2014, DM, CHF, HTN, neuropathy, CVA, STEMI  Clinical Impression  Limited eval due to pt not having prosthesis at time of PT eval. Min guard assist for modified stand pivot from [facility] to bed. Pt stood at EOB (modified technique-pt rests residual limb on surface) and was able to don pull-up and pants with mostly Min guard assist. Pt needed to use R UE intermittently to stabilize/balance. Unable to ambulate-no prosthesis. Discussed d/c plan/entry into home-pt states she will climb steps with assist of daughter and granddaughter. Pt does not feel she will have difficulty with this. Noted some SOB with minimal activity today-2 opposing O2 sat readings during session (1st reading 65-70%, 2nd reading 93% on RA). Explained this to RN and recommended nursing at least reassess O2 needs with pt performing a stand pivot from bed to chair (once prosthesis arrives).     Follow Up Recommendations Home health PT;Supervision - Intermittent    Equipment Recommendations  None recommended by PT    Recommendations for Other Services       Precautions / Restrictions Precautions Precautions: Fall Precaution Comments: L BKA Restrictions Weight Bearing Restrictions: No      Mobility  Bed Mobility                  Transfers Overall transfer level: Needs assistance   Transfers: Sit to/from Stand;Stand Pivot Transfers Sit to Stand: From elevated surface;Min guard Stand pivot transfers: Min guard       General transfer comment: close guard for safety. Pt uses modified technique to rise from/descent to surface. Modified stand pivot technique with pt resting residual limb on bed once close enough.   Ambulation/Gait             General Gait Details: Unable to attempt-pt did not have  prosthesis at hospital during PT eval.   Stairs            Wheelchair Mobility    Modified Rankin (Stroke Patients Only)       Balance Overall balance assessment: Needs assistance         Standing balance support: Single extremity supported;During functional activity Standing balance-Leahy Scale: Fair                               Pertinent Vitals/Pain Pain Assessment: No/denies pain    Home Living Family/patient expects to be discharged to:: Private residence Living Arrangements: Children (daughter works)   Type of Home: House Home Access: Stairs to enter Entrance Stairs-Rails: None Technical brewer of Steps: [place]: Environmental consultant - 2 wheels;Tub bench;Bedside commode;Wheelchair - Education administrator (comment);Hospital bed      Prior Function Level of Independence: Needs assistance   Gait / Transfers Assistance Needed: 2 person assist for stair negotiation. Transfers to [facility] with Mod Ind  ADL's / Homemaking Assistance Needed: assist for meals         Hand Dominance   Dominant Hand: Right    Extremity/Trunk Assessment   Upper Extremity Assessment: Overall WFL for tasks assessed           Lower Extremity Assessment: LLE deficits/detail;RLE deficits/detail   LLE Deficits / Details: L BKA  Cervical / Trunk Assessment: Normal  Communication   Communication: No difficulties  Cognition Arousal/Alertness: Awake/alert Behavior During Therapy: WFL for tasks assessed/performed Overall Cognitive Status: Within Functional Limits for tasks assessed                      General Comments      Exercises        Assessment/Plan    PT Assessment Patient needs continued PT services  PT Diagnosis Difficulty walking;Generalized weakness   PT Problem List Decreased mobility;Decreased balance;Obesity;Decreased knowledge of use of DME  PT Treatment Interventions DME instruction;Gait training;Functional mobility  training;Therapeutic activities;Patient/family education;Balance training   PT Goals (Current goals can be found in the Care Plan section) Acute Rehab PT Goals Patient Stated Goal: home later today PT Goal Formulation: With patient Time For Goal Achievement: 07/11/14 Potential to Achieve Goals: Good    Frequency Min 3X/week   Barriers to discharge        Co-evaluation               End of Session   Activity Tolerance: Patient tolerated treatment well Patient left: in bed;with call bell/phone within reach (sitting at EOB)           Time: 1035-1101 PT Time Calculation (min): 26 min   Charges:   PT Evaluation $Initial PT Evaluation Tier I: 1 Procedure PT Treatments $Therapeutic Activity: 23-37 mins   PT G Codes:          Weston Anna, MPT Pager: [phone]

## 2014-06-27 NOTE — Progress Notes (Signed)
Was weaned to RA during day shift and sats were in mid 90's.  Pt got up to [facility] in the midnight hour, became SOB, sats checked and she was mid 80's and not rising.  Replaced o2 at 2l/m [place].  sats increased to mid 90's and no longer SOB.  LS remain diminished but clear, no increase in edema noted.  Will continue to monitor

## 2014-06-27 NOTE — Progress Notes (Signed)
SATURATION QUALIFICATIONS: (This note is used to comply with regulatory documentation for home oxygen)  Patient Saturations on Room Air at Rest = 93%  Patient Saturations on Room Air while Ambulating/ excertion [id number]  Patient Saturations on 2l Liters of oxygen while Ambulating   Please briefly explain why patient needs home oxygen:Sats drops with excertion, unable to ambulate has bka and does not have prothesis.

## 2014-06-27 NOTE — Progress Notes (Addendum)
Per Arlie Solomons RN, Unit RN.    SATURATION QUALIFICATIONS: (This note is used to comply with regulatory documentation for home oxygen)  Patient Saturations on Room Air at Rest -93%  Patient Saturations on Room Air while on excertion ( pt is a amputee) - 65%  Patient Saturations on 2 Liters of oxygen while on exertion ( pt is a amputee)  --99% Please briefly explain why patient needs home oxygen:Sats drops with excertion, unable to ambulate has bka and does not have prothesis.

## 2014-06-27 NOTE — Discharge Summary (Signed)
Physician Discharge Summary  [name] [id number] DOB: August 09, 1951 DOA: 06/23/2014  PCP: Hayden Rasmussen., MD  Admit date: 06/23/2014 Discharge date: 06/27/2014  Recommendations for Outpatient Follow-up:  1. Pt will need to follow up with PCP in 2-3 weeks post discharge 2. Please obtain BMP to evaluate electrolytes and kidney function 3. Please also check CBC to evaluate Hg and Hct levels 4. Please note that pt was discharged on Lasix 20 mg PO QD, recommended by cardiology  5. Pt's saturations drop to 80's with exertion so oxygen provided upon discharge   Discharge Diagnoses: acute diastolic CHF, chronic diastolic CHF  Active Problems:   Type II or unspecified type diabetes mellitus with renal manifestations, uncontrolled   CAD (coronary artery disease), native coronary artery   Chronic kidney disease (CKD), stage IV (severe)   Anemia in chronic kidney disease(285.21)   Acute on chronic diastolic CHF (congestive heart failure), NYHA class 4   HTN (hypertension)  Discharge Condition: Stable  Diet recommendation: Heart healthy diet discussed in details   History of present illness:  Pt is 63 yo woman with PMH of HTN, DM/neuropathy, CHF, HLD who presented with worsening SOB X 1 week, getting worse in 24 hours prior to admission. Associated symptoms include swelling of LE. CXR showed cardiomegaly and pulmonary edema. BNP was also elevated.   Assessment/Plan:  Acute on chronic diastolic CHF (congestive heart failure), NYHA class 4  - Last TTE was in 2014 and showed an essentially normal EF  - Elevated BNP on admission to 1689  - Weight stable at 271 off of diuretics  - 3L negative during hospital stay.  - Lasix d/c'd 8/9 due to rising Cr, now Crt rending down - Cardiology following while inpatient and recommend low dose Lasix upon discharge  - will place on Lasix 20 mg PO QD HTN (hypertension)  - PO hydralazine 50mg  q8hours Type II or unspecified type diabetes mellitus with  renal manifestations, uncontrolled  - On insulin 30 units at night, 3 units with meals and SSI  - CBGs 150s-200  - A1C elevated to 9.0  - Has received 8 units of SSI in last 24 hours, will maintain maintenance insulin at current dose.  CAD (coronary artery disease), native coronary artery  - No acute chest pain, CHF being treated as above.  Chronic kidney disease (CKD), stage IV (severe), acute on chronic  - Cr up to 3.58 today and then 3.35 off of lasix  - Continue to trend in an outpatient setting  Anemia in chronic kidney disease(285.21)  - no signs of active bleeding  Consultants:  Cardiology Procedures/Studies:  No results found. Antibiotics:  None  Code Status: Full  Family Communication: Pt at bedside  Discharge Exam: Filed Vitals:   06/27/14 0641  BP: 154/59  Pulse: 77  Temp: 98.7 F (37.1 C)  Resp: 22   Filed Vitals:   06/26/14 2004 06/26/14 2300 06/27/14 0024 06/27/14 0641  BP: 136/44   154/59  Pulse: 76   77  Temp: 98.2 F (36.8 C)   98.7 F (37.1 C)  TempSrc: Oral   Oral  Resp: 18 22 20 22   Height:      Weight:      SpO2: 98% 84% 96% 93%    General: Pt is alert, follows commands appropriately, not in acute distress Cardiovascular: Regular rate and rhythm, no rubs, no gallops Respiratory: Clear to auscultation bilaterally, no wheezing, no crackles, no rhonchi Abdominal: Soft, non tender, non distended, bowel sounds +, no guarding  Discharge Instructions  Discharge Instructions   Diet - low sodium heart healthy    Complete by:  As directed      Increase activity slowly    Complete by:  As directed             Medication List         aspirin 325 MG tablet  Take 325 mg by mouth every morning.     atorvastatin 40 MG tablet  Commonly known as:  LIPITOR  Take 1 tablet (40 mg total) by mouth daily at 6 PM.     buPROPion 150 MG 24 hr tablet  Commonly known as:  WELLBUTRIN XL  Take 150 mg by mouth daily.     carvedilol 25 MG tablet   Commonly known as:  COREG  Take 1 tablet (25 mg total) by mouth 2 (two) times daily with a meal.     ferrous sulfate 325 (65 FE) MG tablet  Take 325 mg by mouth daily with breakfast.     furosemide 40 MG tablet  Commonly known as:  LASIX  Take 0.5 tablets (20 mg total) by mouth daily.     gabapentin 100 MG capsule  Commonly known as:  NEURONTIN  Take 100 mg by mouth 3 (three) times daily.     hydrALAZINE 50 MG tablet  Commonly known as:  APRESOLINE  Take 1 tablet (50 mg total) by mouth every 8 (eight) hours.     HYDROcodone-acetaminophen 5-325 MG per tablet  Commonly known as:  NORCO/VICODIN  Take 1-2 tablets by mouth every 4 (four) hours as needed for moderate pain.     insulin aspart 100 UNIT/ML injection  Commonly known as:  novoLOG  Inject 0-15 Units into the skin 3 (three) times daily with meals. Sliding scale insulin CBG 70 - 120: 0 units: CBG 121 - 150: 2 units; CBG 151 - 200: 3 units; CBG 201 - 250: 5 units; CBG 251 - 300: 8 units;CBG 301 - 350: 11 units; CBG 351 - 400: 15 units; CBG > 400 : 15 units and notify your doctor     insulin glargine 100 UNIT/ML injection  Commonly known as:  LANTUS  Inject 30 Units into the skin at bedtime.     omega-3 acid ethyl esters 1 G capsule  Commonly known as:  LOVAZA  Take 1 g by mouth 3 (three) times daily.     pregabalin 75 MG capsule  Commonly known as:  LYRICA  Take 75 mg by mouth 2 (two) times daily.           Follow-up Information   Schedule an appointment as soon as possible for a visit with Hayden Rasmussen., MD.   Specialty:  Family Medicine   Contact information:   [address] [place] [address] [phone]       Follow up with Faye Ramsay, MD. (As needed, call my cell phone [phone])    Specialty:  Internal Medicine   Contact information:   [address] [place] [address] [phone]        The results of significant diagnostics from this  hospitalization (including imaging, microbiology, ancillary and laboratory) are listed below for reference.     Microbiology: No results found for this or any previous visit (from the past 240 hour(s)).   Labs: Basic Metabolic Panel:  Recent Labs Lab 06/23/14 0926 06/24/14 0344 06/25/14 0353 06/26/14 0450 06/26/14 1404 06/27/14 0436  NA  --  142 140  141 141 137  K  --  4.4 4.6 5.1 4.9 5.3  CL  --  109 105 107 106 105  CO2  --  22 23 25 25 24   GLUCOSE  --  161* 153* 175* 185* 188*  BUN  --  48* 56* 69* 66* 69*  CREATININE  --  2.82* 3.28* 3.58* 3.35* 3.06*  CALCIUM  --  8.9 9.2 9.1 8.9 9.2  MG 2.4  --   --   --   --   --   PHOS 3.4  --   --   --   --   --    Liver Function Tests:  Recent Labs Lab 06/23/14 0606 06/24/14 0344  AST 12 9  ALT 12 11  ALKPHOS 136* 123*  BILITOT 0.3 0.3  PROT 7.2 6.8  ALBUMIN 2.9* 2.6*   No results found for this basename: LIPASE, AMYLASE,  in the last 168 hours No results found for this basename: AMMONIA,  in the last 168 hours CBC:  Recent Labs Lab 06/23/14 0245 06/23/14 0314 06/24/14 0344  WBC 6.5  --  6.9  NEUTROABS 4.1  --   --   HGB 10.3* 11.2* 9.0*  HCT 32.6* 33.0* 29.3*  MCV 87.4  --  88.3  PLT 231  --  227   Cardiac Enzymes:  Recent Labs Lab 06/23/14 0606  TROPONINI <0.30   BNP: BNP (last 3 results)  Recent Labs  01/15/14 0320 06/23/14 0245 06/23/14 0926  PROBNP 829.3* 1574.0* 1689.0*   CBG:  Recent Labs Lab 06/25/14 2137 06/26/14 0738 06/26/14 1154 06/26/14 1705 06/26/14 2146  GLUCAP 187* 139* 185* 150* 199*     SIGNED: Time coordinating discharge: Over 30 minutes  Faye Ramsay, MD  [facility] 06/27/2014, 9:40 AM Pager [phone]  If 7PM-7AM, please contact night-coverage [email or web address] Password [email or web address]    `

## 2014-06-27 NOTE — Progress Notes (Signed)
   No new cardiac recommendations.  She will likely need at least a low dose of diuretic (20 mg perhaps of Lasix daily) at discharge.  We will sign off.  Call with further questions.

## 2014-06-27 NOTE — Progress Notes (Addendum)
CARE MANAGEMENT NOTE 06/27/2014  Patient:  Kumari,Bobbie   Account Number:  [id number]  Date Initiated:  06/24/2014  Documentation initiated by:  Olga Coaster  Subjective/Objective Assessment:   ADMITTED WITH CHF     Action/Plan:   CM FOLLOWING FOR DCP   Anticipated DC Date:  06/28/2014   Anticipated DC Plan:  [place]  CM consult      Choice offered to / List presented to:     DME arranged  Vassie Moselle      DME agency  [facility].        Status of service:  Completed, signed off Medicare Important Message given?  YES (If response is "NO", the following Medicare IM given date fields will be blank) Date Medicare IM given:  06/27/2014 Medicare IM given by:  [facility] Date Additional Medicare IM given:   Additional Medicare IM given by:    Discharge Disposition:  HOME/SELF CARE  Per UR Regulation:  Reviewed for med. necessity/level of care/duration of stay  If discussed at Long Length of Stay Meetings, dates discussed:    Comments:   Erenest Rasher, RN Case Manager Signed CASE MANAGEMENT Progress Notes Service date: 06/27/2014 1:29 PM [facility] to deliver oxygen for home. NCM spoke to pt and made aware to contact [facility] when she gets home to deliver oxygen. Jonnie Finner RN CCM Case Mgmt phone [phone]   8/6/2015Mindi Slicker RN,BSN,MHA [phone]

## 2014-06-28 LAB — GLUCOSE, CAPILLARY
GLUCOSE-CAPILLARY: 150 mg/dL — AB (ref 70–99)
GLUCOSE-CAPILLARY: 202 mg/dL — AB (ref 70–99)

## 2014-06-28 NOTE — ED Provider Notes (Signed)
Medical screening examination/treatment/procedure(s) were performed by non-physician practitioner and as supervising physician I was immediately available for consultation/collaboration.   EKG Interpretation   Date/Time:  Wednesday June 23 2014 07:03:05 EDT Ventricular Rate:  68 PR Interval:  181 QRS Duration: 84 QT Interval:  409 QTC Calculation: 435 R Axis:   72 Text Interpretation:  Sinus rhythm Probable left atrial enlargement  Borderline T abnormalities, diffuse leads Confirmed by Lita Mains  MD,  Joal Eakle ([id number]) on 06/23/2014 7:09:36 AM        Julianne Rice, MD 06/28/14 [phone]

## 2014-07-07 ENCOUNTER — Telehealth: Payer: Self-pay | Admitting: Cardiovascular Disease

## 2014-07-07 NOTE — Telephone Encounter (Signed)
Patient has been enrolled in the [place] needs a conformation of the patient's heart failure diagnosis and her most recent EF percentage.

## 2014-07-07 NOTE — Telephone Encounter (Signed)
Left requested information on [facility] secured voicemail.

## 2014-08-06 ENCOUNTER — Emergency Department (HOSPITAL_COMMUNITY): Payer: Medicare Other

## 2014-08-06 ENCOUNTER — Encounter (HOSPITAL_COMMUNITY): Payer: Self-pay | Admitting: Emergency Medicine

## 2014-08-06 ENCOUNTER — Inpatient Hospital Stay (HOSPITAL_COMMUNITY)
Admission: EM | Admit: 2014-08-06 | Discharge: 2014-08-10 | DRG: 291 | Disposition: A | Discharge status: Home Health | Payer: Medicare Other | Attending: Internal Medicine | Admitting: Internal Medicine

## 2014-08-06 DIAGNOSIS — Z7982 Long term (current) use of aspirin: Secondary | ICD-10-CM

## 2014-08-06 DIAGNOSIS — Z8542 Personal history of malignant neoplasm of other parts of uterus: Secondary | ICD-10-CM

## 2014-08-06 DIAGNOSIS — N184 Chronic kidney disease, stage 4 (severe): Secondary | ICD-10-CM | POA: Diagnosis present

## 2014-08-06 DIAGNOSIS — I252 Old myocardial infarction: Secondary | ICD-10-CM | POA: Diagnosis not present

## 2014-08-06 DIAGNOSIS — E1149 Type 2 diabetes mellitus with other diabetic neurological complication: Secondary | ICD-10-CM | POA: Diagnosis present

## 2014-08-06 DIAGNOSIS — I5033 Acute on chronic diastolic (congestive) heart failure: Secondary | ICD-10-CM | POA: Diagnosis present

## 2014-08-06 DIAGNOSIS — Z23 Encounter for immunization: Secondary | ICD-10-CM | POA: Diagnosis not present

## 2014-08-06 DIAGNOSIS — N058 Unspecified nephritic syndrome with other morphologic changes: Secondary | ICD-10-CM | POA: Diagnosis present

## 2014-08-06 DIAGNOSIS — Z87891 Personal history of nicotine dependence: Secondary | ICD-10-CM | POA: Diagnosis not present

## 2014-08-06 DIAGNOSIS — E785 Hyperlipidemia, unspecified: Secondary | ICD-10-CM | POA: Diagnosis present

## 2014-08-06 DIAGNOSIS — E875 Hyperkalemia: Secondary | ICD-10-CM | POA: Diagnosis present

## 2014-08-06 DIAGNOSIS — E1142 Type 2 diabetes mellitus with diabetic polyneuropathy: Secondary | ICD-10-CM | POA: Diagnosis present

## 2014-08-06 DIAGNOSIS — N189 Chronic kidney disease, unspecified: Principal | ICD-10-CM

## 2014-08-06 DIAGNOSIS — E66813 Obesity, class 3: Secondary | ICD-10-CM | POA: Diagnosis present

## 2014-08-06 DIAGNOSIS — Z6841 Body Mass Index (BMI) 40.0 and over, adult: Secondary | ICD-10-CM | POA: Diagnosis not present

## 2014-08-06 DIAGNOSIS — Z9861 Coronary angioplasty status: Secondary | ICD-10-CM | POA: Diagnosis not present

## 2014-08-06 DIAGNOSIS — I509 Heart failure, unspecified: Secondary | ICD-10-CM | POA: Diagnosis present

## 2014-08-06 DIAGNOSIS — N183 Chronic kidney disease, stage 3 unspecified: Secondary | ICD-10-CM | POA: Insufficient documentation

## 2014-08-06 DIAGNOSIS — N039 Chronic nephritic syndrome with unspecified morphologic changes: Secondary | ICD-10-CM | POA: Diagnosis present

## 2014-08-06 DIAGNOSIS — I5031 Acute diastolic (congestive) heart failure: Secondary | ICD-10-CM

## 2014-08-06 DIAGNOSIS — Z794 Long term (current) use of insulin: Secondary | ICD-10-CM

## 2014-08-06 DIAGNOSIS — Z89519 Acquired absence of unspecified leg below knee: Secondary | ICD-10-CM

## 2014-08-06 DIAGNOSIS — J9610 Chronic respiratory failure, unspecified whether with hypoxia or hypercapnia: Secondary | ICD-10-CM | POA: Diagnosis present

## 2014-08-06 DIAGNOSIS — I739 Peripheral vascular disease, unspecified: Secondary | ICD-10-CM

## 2014-08-06 DIAGNOSIS — I129 Hypertensive chronic kidney disease with stage 1 through stage 4 chronic kidney disease, or unspecified chronic kidney disease: Secondary | ICD-10-CM | POA: Diagnosis present

## 2014-08-06 DIAGNOSIS — I2510 Atherosclerotic heart disease of native coronary artery without angina pectoris: Secondary | ICD-10-CM | POA: Diagnosis present

## 2014-08-06 DIAGNOSIS — D631 Anemia in chronic kidney disease: Secondary | ICD-10-CM | POA: Diagnosis present

## 2014-08-06 DIAGNOSIS — S88119A Complete traumatic amputation at level between knee and ankle, unspecified lower leg, initial encounter: Secondary | ICD-10-CM | POA: Diagnosis not present

## 2014-08-06 DIAGNOSIS — Z8673 Personal history of transient ischemic attack (TIA), and cerebral infarction without residual deficits: Secondary | ICD-10-CM | POA: Diagnosis not present

## 2014-08-06 DIAGNOSIS — E1159 Type 2 diabetes mellitus with other circulatory complications: Secondary | ICD-10-CM

## 2014-08-06 DIAGNOSIS — J9611 Chronic respiratory failure with hypoxia: Secondary | ICD-10-CM | POA: Diagnosis present

## 2014-08-06 DIAGNOSIS — Z9981 Dependence on supplemental oxygen: Secondary | ICD-10-CM | POA: Diagnosis not present

## 2014-08-06 DIAGNOSIS — I130 Hypertensive heart and chronic kidney disease with heart failure and stage 1 through stage 4 chronic kidney disease, or unspecified chronic kidney disease: Principal | ICD-10-CM | POA: Diagnosis present

## 2014-08-06 DIAGNOSIS — I10 Essential (primary) hypertension: Secondary | ICD-10-CM

## 2014-08-06 DIAGNOSIS — E1165 Type 2 diabetes mellitus with hyperglycemia: Secondary | ICD-10-CM

## 2014-08-06 DIAGNOSIS — Z79899 Other long term (current) drug therapy: Secondary | ICD-10-CM | POA: Diagnosis not present

## 2014-08-06 DIAGNOSIS — N179 Acute kidney failure, unspecified: Secondary | ICD-10-CM | POA: Diagnosis present

## 2014-08-06 DIAGNOSIS — E1129 Type 2 diabetes mellitus with other diabetic kidney complication: Secondary | ICD-10-CM | POA: Diagnosis present

## 2014-08-06 DIAGNOSIS — J810 Acute pulmonary edema: Secondary | ICD-10-CM | POA: Diagnosis present

## 2014-08-06 DIAGNOSIS — Z89512 Acquired absence of left leg below knee: Secondary | ICD-10-CM

## 2014-08-06 DIAGNOSIS — D638 Anemia in other chronic diseases classified elsewhere: Secondary | ICD-10-CM | POA: Diagnosis present

## 2014-08-06 DIAGNOSIS — R0602 Shortness of breath: Secondary | ICD-10-CM | POA: Diagnosis present

## 2014-08-06 DIAGNOSIS — J811 Chronic pulmonary edema: Secondary | ICD-10-CM | POA: Insufficient documentation

## 2014-08-06 LAB — URINALYSIS, ROUTINE W REFLEX MICROSCOPIC
Bilirubin Urine: NEGATIVE
GLUCOSE, UA: 250 mg/dL — AB
KETONES UR: NEGATIVE mg/dL
Nitrite: NEGATIVE
Protein, ur: 100 mg/dL — AB
Specific Gravity, Urine: 1.013 (ref 1.005–1.030)
UROBILINOGEN UA: 0.2 mg/dL (ref 0.0–1.0)
pH: 6 (ref 5.0–8.0)

## 2014-08-06 LAB — BASIC METABOLIC PANEL
ANION GAP: 11 (ref 5–15)
BUN: 79 mg/dL — ABNORMAL HIGH (ref 6–23)
CO2: 22 mEq/L (ref 19–32)
Calcium: 9.2 mg/dL (ref 8.4–10.5)
Chloride: 110 mEq/L (ref 96–112)
Creatinine, Ser: 2.8 mg/dL — ABNORMAL HIGH (ref 0.50–1.10)
GFR calc Af Amer: 20 mL/min — ABNORMAL LOW (ref >90)
GFR, EST NON AFRICAN AMERICAN: 17 mL/min — AB (ref >90)
Glucose, Bld: 205 mg/dL — ABNORMAL HIGH (ref 70–99)
Potassium: 5.5 mEq/L — ABNORMAL HIGH (ref 3.7–5.3)
SODIUM: 143 meq/L (ref 137–147)

## 2014-08-06 LAB — GLUCOSE, CAPILLARY
Glucose-Capillary: 134 mg/dL — ABNORMAL HIGH (ref 70–99)
Glucose-Capillary: 243 mg/dL — ABNORMAL HIGH (ref 70–99)
Glucose-Capillary: 216 mg/dL — ABNORMAL HIGH (ref 70–99)
Glucose-Capillary: 239 mg/dL — ABNORMAL HIGH (ref 70–99)

## 2014-08-06 LAB — CBC
HCT: 27.7 % — ABNORMAL LOW (ref 36.0–46.0)
Hemoglobin: 8.8 g/dL — ABNORMAL LOW (ref 12.0–15.0)
MCH: 28.4 pg (ref 26.0–34.0)
MCHC: 31.8 g/dL (ref 30.0–36.0)
MCV: 89.4 fL (ref 78.0–100.0)
PLATELETS: 152 10*3/uL (ref 150–400)
RBC: 3.1 MIL/uL — AB (ref 3.87–5.11)
RDW: 15.8 % — ABNORMAL HIGH (ref 11.5–15.5)
WBC: 5.9 10*3/uL (ref 4.0–10.5)

## 2014-08-06 LAB — I-STAT TROPONIN, ED: Troponin i, poc: 0 ng/mL (ref 0.00–0.08)

## 2014-08-06 LAB — TSH: TSH: 5 u[IU]/mL — AB (ref 0.350–4.500)

## 2014-08-06 LAB — IRON AND TIBC
Iron: 44 ug/dL (ref 42–135)
Saturation Ratios: 16 % — ABNORMAL LOW (ref 20–55)
TIBC: 275 ug/dL (ref 250–470)
UIBC: 231 ug/dL (ref 125–400)

## 2014-08-06 LAB — URINE MICROSCOPIC-ADD ON

## 2014-08-06 LAB — HEMOGLOBIN A1C
HEMOGLOBIN A1C: 8.2 % — AB (ref <5.7)
Mean Plasma Glucose: 189 mg/dL — ABNORMAL HIGH (ref <117)

## 2014-08-06 LAB — PRO B NATRIURETIC PEPTIDE: PRO B NATRI PEPTIDE: 1374 pg/mL — AB (ref 0–125)

## 2014-08-06 IMAGING — CR DG CHEST 1V PORT
1 series · 1 of 1 positions shown · non-contrast
Comparison: 03/18/2013.

CLINICAL DATA: Volume overload. Chronic kidney disease. Question
pulmonary edema.

EXAM:
PORTABLE CHEST - 1 VIEW

[AP]
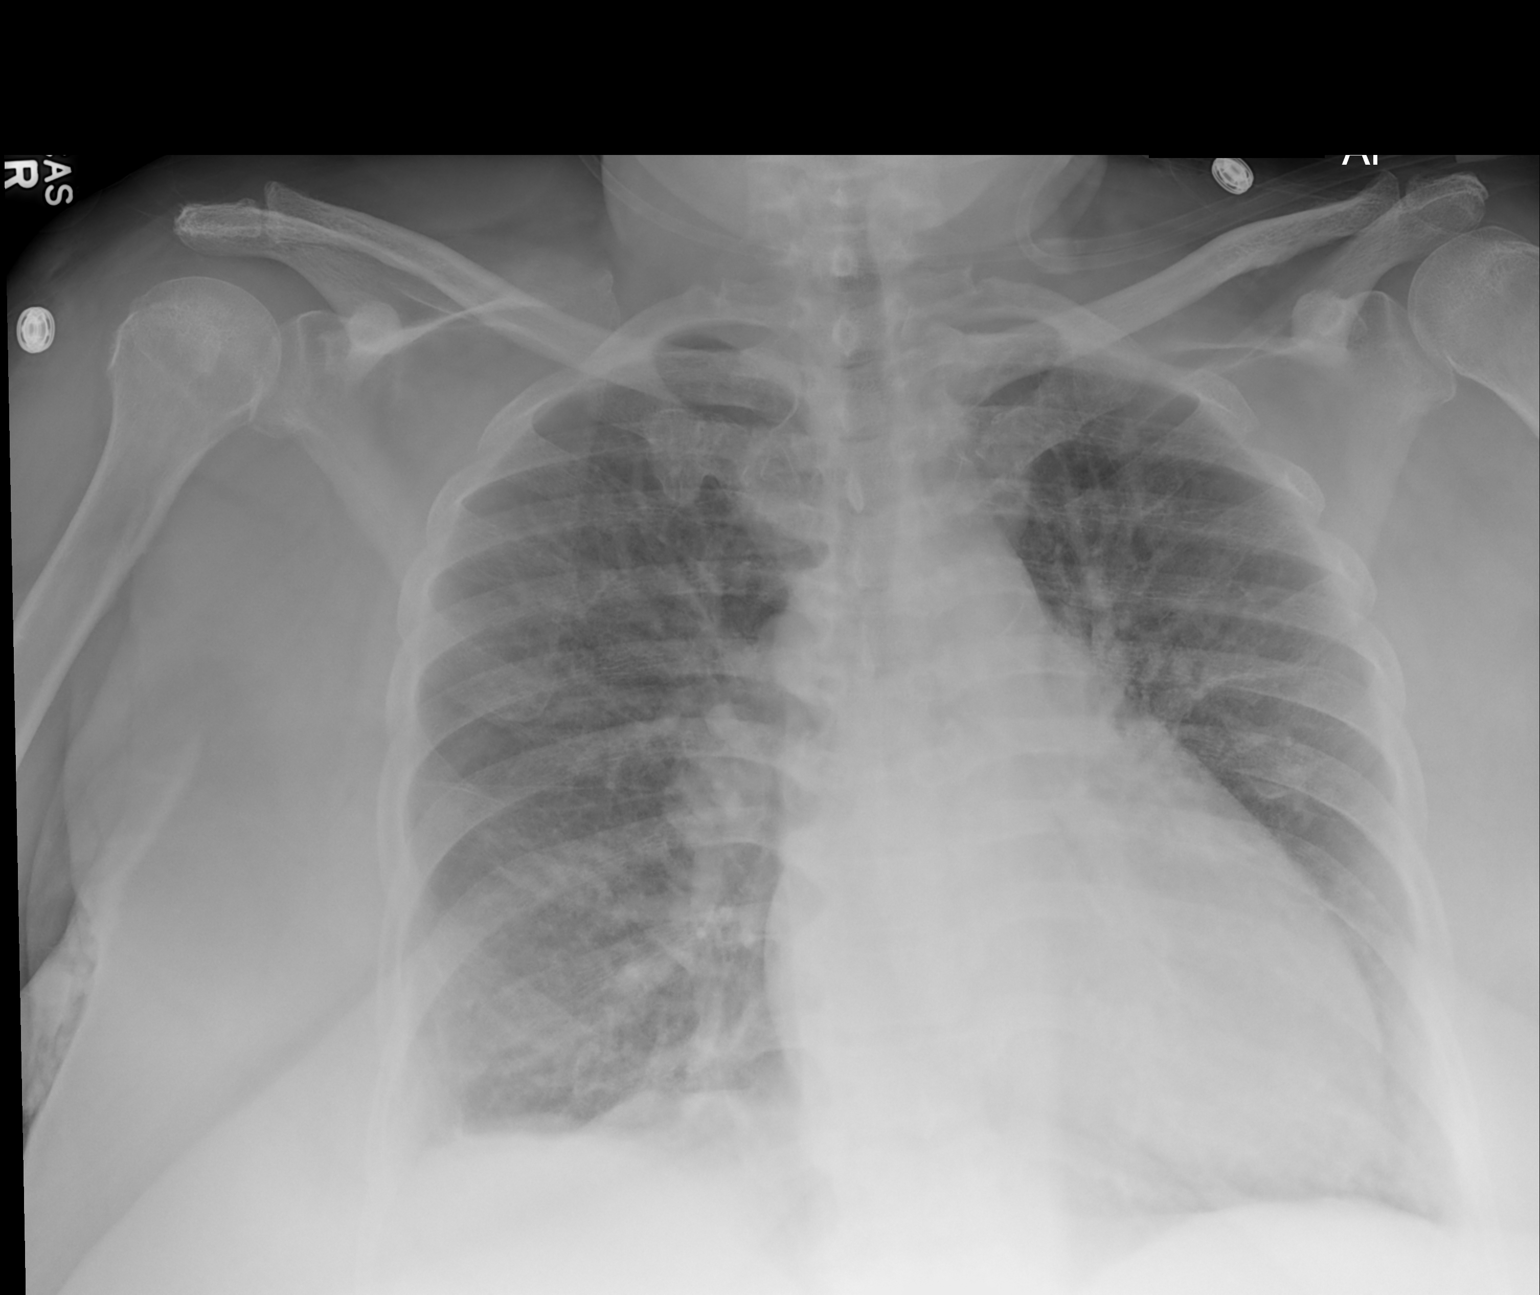

[1 of 1 positions shown; findings below may reference images not displayed]

FINDINGS: 9051 hr. There is stable cardiomegaly and chronic vascular
congestion. No overt pulmonary edema, confluent airspace opacity or
significant pleural effusion is identified.
IMPRESSION: Stable cardiomegaly and chronic vascular congestion. No overt
pulmonary edema identified.

## 2014-08-06 MED ORDER — HYDROCODONE-ACETAMINOPHEN 5-325 MG PO TABS: 1.0000 | ORAL_TABLET | ORAL | Status: DC | PRN

## 2014-08-06 MED ORDER — GABAPENTIN 100 MG PO CAPS
100.0000 mg | ORAL_CAPSULE | Freq: Three times a day (TID) | ORAL | Status: DC
  Administered 2014-08-06 – 2014-08-10 (×13): 100 mg via ORAL
  Filled 2014-08-06 (×15): qty 1

## 2014-08-06 MED ORDER — ACETAMINOPHEN 650 MG RE SUPP
650.0000 mg | Freq: Four times a day (QID) | RECTAL | Status: DC | PRN
Start: 2014-08-06 — End: 2014-08-10

## 2014-08-06 MED ORDER — FERROUS SULFATE 325 (65 FE) MG PO TABS
325.0000 mg | ORAL_TABLET | Freq: Every day | ORAL | Status: DC
  Administered 2014-08-07 – 2014-08-10 (×4): 325 mg via ORAL
  Filled 2014-08-06 (×5): qty 1

## 2014-08-06 MED ORDER — INFLUENZA VAC SPLIT QUAD 0.5 ML IM SUSY
0.5000 mL | PREFILLED_SYRINGE | INTRAMUSCULAR | Status: AC
  Administered 2014-08-07: 0.5 mL via INTRAMUSCULAR
  Filled 2014-08-06 (×2): qty 0.5

## 2014-08-06 MED ORDER — FUROSEMIDE 10 MG/ML IJ SOLN
40.0000 mg | Freq: Two times a day (BID) | INTRAMUSCULAR | Status: DC
  Administered 2014-08-06 – 2014-08-10 (×9): 40 mg via INTRAVENOUS
  Filled 2014-08-06 (×12): qty 4

## 2014-08-06 MED ORDER — INSULIN GLARGINE 100 UNIT/ML ~~LOC~~ SOLN
34.0000 [IU] | Freq: Every day | SUBCUTANEOUS | Status: DC
  Administered 2014-08-06: 34 [IU] via SUBCUTANEOUS
  Filled 2014-08-06: qty 0.34

## 2014-08-06 MED ORDER — CARVEDILOL 25 MG PO TABS
25.0000 mg | ORAL_TABLET | Freq: Two times a day (BID) | ORAL | Status: DC
  Administered 2014-08-06 – 2014-08-10 (×8): 25 mg via ORAL
  Filled 2014-08-06 (×10): qty 1

## 2014-08-06 MED ORDER — AMLODIPINE BESYLATE 5 MG PO TABS
5.0000 mg | ORAL_TABLET | Freq: Every day | ORAL | Status: DC
  Administered 2014-08-06 – 2014-08-10 (×5): 5 mg via ORAL
  Filled 2014-08-06 (×5): qty 1

## 2014-08-06 MED ORDER — ASPIRIN 325 MG PO TABS
325.0000 mg | ORAL_TABLET | Freq: Every morning | ORAL | Status: DC
  Administered 2014-08-06 – 2014-08-10 (×5): 325 mg via ORAL
  Filled 2014-08-06 (×5): qty 1

## 2014-08-06 MED ORDER — MORPHINE SULFATE 2 MG/ML IJ SOLN: 1.0000 mg | INTRAMUSCULAR | Status: DC | PRN

## 2014-08-06 MED ORDER — ONDANSETRON HCL 4 MG PO TABS: 4.0000 mg | ORAL_TABLET | Freq: Four times a day (QID) | ORAL | Status: DC | PRN

## 2014-08-06 MED ORDER — HEPARIN SODIUM (PORCINE) 5000 UNIT/ML IJ SOLN
5000.0000 [IU] | Freq: Three times a day (TID) | INTRAMUSCULAR | Status: DC
  Administered 2014-08-06 – 2014-08-10 (×13): 5000 [IU] via SUBCUTANEOUS
  Filled 2014-08-06 (×16): qty 1

## 2014-08-06 MED ORDER — FUROSEMIDE 10 MG/ML IJ SOLN: 40.0000 mg | Freq: Once | INTRAMUSCULAR | Status: DC

## 2014-08-06 MED ORDER — ACETAMINOPHEN 325 MG PO TABS: 650.0000 mg | ORAL_TABLET | Freq: Four times a day (QID) | ORAL | Status: DC | PRN

## 2014-08-06 MED ORDER — INSULIN ASPART 100 UNIT/ML ~~LOC~~ SOLN
0.0000 [IU] | Freq: Three times a day (TID) | SUBCUTANEOUS | Status: DC
  Administered 2014-08-06 (×2): 3 [IU] via SUBCUTANEOUS
  Administered 2014-08-07: 5 [IU] via SUBCUTANEOUS
  Administered 2014-08-07 – 2014-08-08 (×3): 3 [IU] via SUBCUTANEOUS
  Administered 2014-08-08: 1 [IU] via SUBCUTANEOUS
  Administered 2014-08-08: 2 [IU] via SUBCUTANEOUS
  Administered 2014-08-09: 3 [IU] via SUBCUTANEOUS
  Administered 2014-08-09 (×2): 1 [IU] via SUBCUTANEOUS
  Administered 2014-08-10 (×2): 2 [IU] via SUBCUTANEOUS

## 2014-08-06 MED ORDER — HYDRALAZINE HCL 50 MG PO TABS
50.0000 mg | ORAL_TABLET | Freq: Three times a day (TID) | ORAL | Status: DC
  Administered 2014-08-06 – 2014-08-09 (×10): 50 mg via ORAL
  Filled 2014-08-06 (×16): qty 1

## 2014-08-06 MED ORDER — ATORVASTATIN CALCIUM 40 MG PO TABS
40.0000 mg | ORAL_TABLET | Freq: Every day | ORAL | Status: DC
  Administered 2014-08-06 – 2014-08-09 (×4): 40 mg via ORAL
  Filled 2014-08-06 (×5): qty 1

## 2014-08-06 MED ORDER — SODIUM CHLORIDE 0.9 % IJ SOLN
3.0000 mL | Freq: Two times a day (BID) | INTRAMUSCULAR | Status: DC
  Administered 2014-08-06 – 2014-08-10 (×8): 3 mL via INTRAVENOUS

## 2014-08-06 MED ORDER — ALUM & MAG HYDROXIDE-SIMETH 200-200-20 MG/5ML PO SUSP: 30.0000 mL | Freq: Four times a day (QID) | ORAL | Status: DC | PRN

## 2014-08-06 MED ORDER — ONDANSETRON HCL 4 MG/2ML IJ SOLN: 4.0000 mg | Freq: Four times a day (QID) | INTRAMUSCULAR | Status: DC | PRN

## 2014-08-06 NOTE — Care Management Note (Signed)
    Page 1 of 1   08/06/2014     12:27:55 PM CARE MANAGEMENT NOTE 08/06/2014  Patient:  [name]   Account Number:  [id number]  Date Initiated:  08/06/2014  Documentation initiated by:  Dessa Phi  Subjective/Objective Assessment:   63 Y/O F ADMITTED W/PULMONARY EDEMA.     Action/Plan:   FROM HOME.ACTIVE W/[facility]ENT[facility]A-HHPT.   Anticipated DC Date:  08/09/2014   Anticipated DC Plan:  [place]  CM consult      [facility] Choice  Resumption Of Svcs/PTA Provider   Choice offered to / List presented to:  C-1 Patient           Status of service:  In process, will continue to follow Medicare Important Message given?   (If response is "NO", the following Medicare IM given date fields will be blank) Date Medicare IM given:   Medicare IM given by:   Date Additional Medicare IM given:   Additional Medicare IM given by:    Discharge Disposition:    Per UR Regulation:  Reviewed for med. necessity/level of care/duration of stay  If discussed at [place] of Stay Meetings, dates discussed:    Comments:  08/06/14 [name] RN,BSN NCM [place] REP [facility]-ACTIVE W/HHPT.WOULD RECOMMEND HHRN-CHF PROTOCAL, & RESUMPTION OF HHPT.AWAIT FINAL HHRN/HHPT ORDER.

## 2014-08-06 NOTE — ED Notes (Signed)
Bed: [id number] Expected date:  Expected time:  Means of arrival:  Comments: EMS respiratory distress

## 2014-08-06 NOTE — Consult Note (Signed)
Reason for Consult:AKI/CKD Referring Physician: Hartford Poli, MD  [name] is an 63 y.o. female.  HPI: Pt is a 63yo AAF with PMH sig for long-standing DM, HTN, obesity, CAD, PVD, and CKD stage 3-4 (baseline Scr has ranged from 1.8-3 with several episodes of AKI/CKD related to hypotension in setting of ARB therapy as well as decompensated CHF.  She was admitted 06/27/14 with decompensated CHF and did well until she started developing worsening SOB, DOE and orthopnea.  CXR showed pulmonary edema, exam showed 2 + pitting edema, BNP elevated at 1300 and pt was admitted to [facility] on 08/06/14 for acute on chronic CHF.  We were asked to see the patient as she has not followed up with our practice in a year and it was thought that we could help manage her diuretic regimen as well as her progressive CKD.  The trend in Scr is seen below:  She last saw Dr. Moshe Cipro in our office on 06/30/13 and had a Scr of 2.12 at that time but did not follow up as directed.     Trend in Creatinine: Creatinine, Ser  Date/Time Value Ref Range Status  08/06/2014  5:03 AM 2.80* 0.50 - 1.10 mg/dL Final  06/27/2014  4:36 AM 3.06* 0.50 - 1.10 mg/dL Final  06/26/2014  2:04 PM 3.35* 0.50 - 1.10 mg/dL Final  06/26/2014  4:50 AM 3.58* 0.50 - 1.10 mg/dL Final  06/25/2014  3:53 AM 3.28* 0.50 - 1.10 mg/dL Final  06/24/2014  3:44 AM 2.82* 0.50 - 1.10 mg/dL Final  06/23/2014  6:06 AM 2.45* 0.50 - 1.10 mg/dL Final  06/23/2014  3:14 AM 2.50* 0.50 - 1.10 mg/dL Final  01/15/2014  8:30 AM 2.65* 0.50 - 1.10 mg/dL Final  01/14/2014  4:37 AM 2.54* 0.50 - 1.10 mg/dL Final  01/12/2014  5:05 AM 2.13* 0.50 - 1.10 mg/dL Final  01/11/2014 11:39 AM 1.85* 0.50 - 1.10 mg/dL Final  11/20/2013 12:00 PM 1.62* 0.50 - 1.10 mg/dL Final  08/14/2013  6:40 AM 1.72* 0.50 - 1.10 mg/dL Final  08/13/2013  7:55 AM 2.28* 0.50 - 1.10 mg/dL Final  08/12/2013 11:30 AM 3.21* 0.50 - 1.10 mg/dL Final  08/11/2013  4:45 AM 6.46* 0.50 - 1.10 mg/dL Final  08/10/2013  1:12 PM 5.60* 0.50 - 1.10 mg/dL  Final  08/10/2013  5:40 AM 5.23* 0.50 - 1.10 mg/dL Final  08/10/2013 12:30 AM 6.32* 0.50 - 1.10 mg/dL Final  08/09/2013  1:32 AM 3.74* 0.50 - 1.10 mg/dL Final  08/08/2013  2:05 PM 3.52* 0.50 - 1.10 mg/dL Final  08/08/2013  5:58 AM 3.53* 0.50 - 1.10 mg/dL Final  08/07/2013  4:30 AM 3.14* 0.50 - 1.10 mg/dL Final  08/06/2013 11:15 AM 3.14* 0.50 - 1.10 mg/dL Final  06/10/2013  6:00 AM 3.04* 0.50 - 1.10 mg/dL Final  06/09/2013  5:25 AM 3.21* 0.50 - 1.10 mg/dL Final  06/08/2013  7:46 AM 3.65* 0.50 - 1.10 mg/dL Final  06/07/2013  5:50 AM 4.34* 0.50 - 1.10 mg/dL Final  06/06/2013  4:30 AM 4.57* 0.50 - 1.10 mg/dL Final  06/04/2013  6:00 AM 7.23* 0.50 - 1.10 mg/dL Final  06/03/2013  5:45 AM 7.18* 0.50 - 1.10 mg/dL Final  06/02/2013  6:20 AM 6.50* 0.50 - 1.10 mg/dL Final  06/01/2013 11:26 AM 5.87* 0.50 - 1.10 mg/dL Final  06/01/2013  5:35 AM 5.53* 0.50 - 1.10 mg/dL Final  05/30/2013  4:05 AM 2.56* 0.50 - 1.10 mg/dL Final  05/29/2013  9:16 PM 2.49* 0.50 - 1.10 mg/dL Final  03/21/2013  5:10 AM 1.71* 0.50 - 1.10 mg/dL Final  03/19/2013  4:20 AM 1.68* 0.50 - 1.10 mg/dL Final  03/18/2013  3:09 PM 1.46* 0.50 - 1.10 mg/dL Final  03/18/2013  8:45 AM 1.51* 0.50 - 1.10 mg/dL Final  09/25/2012 12:50 PM 1.30* 0.50 - 1.10 mg/dL Final  07/20/2012  8:43 PM 1.34* 0.50 - 1.10 mg/dL Final  11/27/2010 11:55 AM 1.08  0.4 - 1.2 mg/dL Final  11/23/2010 11:51 AM 1.48* 0.4 - 1.2 mg/dL Final  11/23/2010  3:20 AM 1.73* 0.4 - 1.2 mg/dL Final  11/22/2010 10:23 AM 2.05* 0.4 - 1.2 mg/dL Final  11/22/2010  4:06 AM 2.07* 0.4 - 1.2 mg/dL Final  11/15/2010 12:30 PM 1.04  0.4 - 1.2 mg/dL Final  09/04/2010  6:30 AM 1.17  0.4 - 1.2 mg/dL Final  09/03/2010  6:10 AM 1.43* 0.4 - 1.2 mg/dL Final  09/02/2010  7:22 AM 1.18  0.4 - 1.2 mg/dL Final    PMH:   Past Medical History  Diagnosis Date  . ST elevation myocardial infarction (STEMI) of inferior wall  03/2009    Ostial/proximal RCA occlusion treated with BMS stent  . CAD (coronary artery disease), native coronary  artery      status post PCI to the RCA for inferior STEMI  . Cancer     endo ca  . Hypertension associated with diabetes   . Hyperlipidemia LDL goal <70   . Diabetes mellitus, type II, insulin dependent     With complications - CAD, CVA, peripheral ulcer ,  . Stroke/cerebrovascular accident  4/30/ 2014    Right-sided weakness, mostly with balance issues and only mild weakness.  . Diabetic peripheral neuropathy associated with type 2 diabetes mellitus   . Obesity, Class III, BMI 40-49.9 (morbid obesity)     BMI 43; 5' 2',  235 pounds 6.4 ounces  . CKD (chronic kidney disease), stage IV     Recent acute on chronic exacerbation in July 2014  . PAD (peripheral artery disease)   . Diabetic foot ulcer   . Dry gangrene     Left second toe; now on Sole of L foot    PSH:   Past Surgical History  Procedure Laterality Date  . Abdominal hysterectomy    . Leg tendon surgery      patient fell in a store right leg  . Leg surgery      hole in bone( during Childhood)  . Cardiac catheterization  03/31/2009    Proximal RCA thrombotic occlusion (inferior STEMI) other coronaries but in a codominant system  . Coronary angioplasty  03/31/2009    PTCA , bare-metal stent 3.5 mm x 24 mm ostium of RCA ,EF 55%  . Amputation Left 08/07/2013    Procedure: left midfoot amputation;  Surgeon: Marianna Payment, MD;  Location: [facility];  Service: Orthopedics;  Laterality: Left;  . Amputation Left 08/09/2013    Procedure: AMPUTATION BELOW KNEE;  Surgeon: Marianna Payment, MD;  Location: [facility];  Service: Orthopedics;  Laterality: Left;    Allergies: No Known Allergies  Medications:   Prior to Admission medications   Medication Sig Start Date End Date Taking? Authorizing Provider  amLODipine (NORVASC) 5 MG tablet Take 5 mg by mouth daily.   Yes Historical Provider, MD  aspirin 325 MG tablet Take 325 mg by mouth every morning.    Yes Historical Provider, MD  atorvastatin (LIPITOR) 40 MG tablet Take 1 tablet  (40 mg total) by mouth daily at 6 PM. 06/27/14  Yes Theodis Blaze, MD  carvedilol (COREG) 25 MG tablet Take 1 tablet (25 mg total) by mouth 2 (two) times daily with a meal. 06/27/14  Yes Theodis Blaze, MD  ferrous sulfate 325 (65 FE) MG tablet Take 325 mg by mouth daily with breakfast.   Yes Historical Provider, MD  furosemide (LASIX) 40 MG tablet Take 0.5 tablets (20 mg total) by mouth daily. 06/27/14  Yes Theodis Blaze, MD  gabapentin (NEURONTIN) 100 MG capsule Take 100 mg by mouth 3 (three) times daily.   Yes Historical Provider, MD  hydrALAZINE (APRESOLINE) 50 MG tablet Take 1 tablet (50 mg total) by mouth every 8 (eight) hours. 06/27/14  Yes Theodis Blaze, MD  insulin aspart (NOVOLOG) 100 UNIT/ML injection Inject 0-15 Units into the skin 3 (three) times daily with meals. Sliding scale insulin CBG 70 - 120: 0 units: CBG 121 - 150: 2 units; CBG 151 - 200: 3 units; CBG 201 - 250: 5 units; CBG 251 - 300: 8 units;CBG 301 - 350: 11 units; CBG 351 - 400: 15 units; CBG > 400 : 15 units and notify your doctor 06/09/13  Yes Ripudeep Krystal Eaton, MD  insulin glargine (LANTUS) 100 UNIT/ML injection Inject 34 Units into the skin at bedtime.    Yes Historical Provider, MD  nystatin-triamcinolone ointment (MYCOLOG) Apply 1 application topically 2 (two) times daily.   Yes Historical Provider, MD  Omega-3 Fatty Acids (FISH OIL) 1000 MG CAPS Take 1 capsule by mouth 2 (two) times daily.   Yes Historical Provider, MD  pregabalin (LYRICA) 75 MG capsule Take 75 mg by mouth 2 (two) times daily.   Yes Historical Provider, MD    Inpatient medications: . amLODipine  5 mg Oral Daily  . aspirin  325 mg Oral q morning - 10a  . atorvastatin  40 mg Oral q1800  . carvedilol  25 mg Oral BID WC  . [START ON 08/07/2014] ferrous sulfate  325 mg Oral Q breakfast  . furosemide  40 mg Intravenous BID  . gabapentin  100 mg Oral TID  . heparin  5,000 Units Subcutaneous 3 times per day  . hydrALAZINE  50 mg Oral 3 times per day  . [START ON  08/07/2014] Influenza vac split quadrivalent PF  0.5 mL Intramuscular Tomorrow-1000  . insulin aspart  0-9 Units Subcutaneous TID WC  . insulin glargine  34 Units Subcutaneous QHS  . sodium chloride  3 mL Intravenous Q12H    Discontinued Meds:   Medications Discontinued During This Encounter  Medication Reason  . buPROPion (WELLBUTRIN XL) 150 MG 24 hr tablet Patient has not taken in last 30 days  . omega-3 acid ethyl esters (LOVAZA) 1 G capsule Patient has not taken in last 30 days  . HYDROcodone-acetaminophen (NORCO/VICODIN) 5-325 MG per tablet Completed Course  . furosemide (LASIX) injection 40 mg     Social History:  reports that she quit smoking about 19 years ago. She has never used smokeless tobacco. She reports that she does not drink alcohol or use illicit drugs.  Family History:   Family History  Problem Relation Age of Onset  . Alcoholism Mother     Died from complications  . Alcoholism Father     Died from complications  . Heart disease      Unknown, unclear. Patient is not a good historian.  Essentially no pertinent history known    Pertinent items are noted in HPI. Weight change:   Intake/Output Summary (Last 24  hours) at 08/06/14 1113 Last data filed at 08/06/14 1058  Gross per 24 hour  Intake      3 ml  Output      0 ml  Net      3 ml   BP 149/52  Pulse 66  Temp(Src) 98 F (36.7 C) (Oral)  Resp 18  Ht 5\' 2"  (1.575 m)  Wt 121.2 kg (267 lb 3.2 oz)  BMI 48.86 kg/m2  SpO2 98% Filed Vitals:   08/06/14 0445 08/06/14 0808 08/06/14 0837  BP: 151/78 163/75 149/52  Pulse: 70 68 66  Temp: 98.9 F (37.2 C) 98.4 F (36.9 C) 98 F (36.7 C)  TempSrc: Oral Oral Oral  Resp: 22 20 18   Height: 5\' 2"  (1.575 m)  5\' 2"  (1.575 m)  Weight: 122.925 kg (271 lb)  121.2 kg (267 lb 3.2 oz)  SpO2: 100% 100% 98%     General appearance: fatigued and morbidly obese Head: Normocephalic, without obvious abnormality, atraumatic Eyes: negative findings: lids and lashes normal,  conjunctivae and sclerae normal and corneas clear Neck: no adenopathy, no carotid bruit, no JVD, supple, symmetrical, trachea midline and thyroid not enlarged, symmetric, no tenderness/mass/nodules Resp: clear to auscultation bilaterally Cardio: regular rate and rhythm and no rub GI: soft, non-tender; bowel sounds normal; no masses,  no organomegaly Extremities: edema 2+ pitting edema and s/p LBKA  Labs: Basic Metabolic Panel:  Recent Labs Lab 08/06/14 0503  NA 143  K 5.5*  CL 110  CO2 22  GLUCOSE 205*  BUN 79*  CREATININE 2.80*  CALCIUM 9.2   Liver Function Tests: No results found for this basename: AST, ALT, ALKPHOS, BILITOT, PROT, ALBUMIN,  in the last 168 hours No results found for this basename: LIPASE, AMYLASE,  in the last 168 hours No results found for this basename: AMMONIA,  in the last 168 hours CBC:  Recent Labs Lab 08/06/14 0503  WBC 5.9  HGB 8.8*  HCT 27.7*  MCV 89.4  PLT 152   PT/INR: @LABRCNTIP (inr:5) Cardiac Enzymes: )No results found for this basename: CKTOTAL, CKMB, CKMBINDEX, TROPONINI,  in the last 168 hours CBG:  Recent Labs Lab 08/06/14 1044  GLUCAP 134*    Iron Studies: No results found for this basename: IRON, TIBC, TRANSFERRIN, FERRITIN,  in the last 168 hours  Xrays/Other Studies: Dg Chest Port 1 View  08/06/2014   CLINICAL DATA:  Worsening shortness of breath.  EXAM: PORTABLE CHEST - 1 VIEW  COMPARISON:  Chest radiograph performed 06/23/2014  FINDINGS: The lungs are well-aerated. Vascular congestion is noted, with increased interstitial markings, raising concern for pulmonary edema. Small bilateral pleural effusions are suspected. There is no evidence of pneumothorax.  The cardiomediastinal silhouette is borderline normal in size. No acute osseous abnormalities are seen.  IMPRESSION: Vascular congestion, with increased interstitial markings, raising concern for pulmonary edema. Small bilateral pleural effusions suspected.    Electronically Signed   By: Garald Balding M.D.   On: 08/06/2014 05:09     Assessment/Plan: 1.  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  Discussed the ways to delay the progression of CKD with tight BP control (goal <130/80), tight glucose control (goal HgbA1c ,7%), use of ACE/ARB (on hold due to multiple episodes of AKI/CKD), avoidance of nephrotoxic agents such as NSAIDs/Cox-II I's/IV contrast, as well as sodium and fluid restriction to minimize cardiorenal syndrome 2. CHF- agree with higher dose of lasix and need for  education on sodium and fluid restriction as well as weight loss. 3. Hyperkalemia- will recheck after IV lasix 4. HTN- stable 5. Anemia- likely due to [facility]- will check iron stores and guaiac stools.  May benefit from epo 6. DM- per primary team   Jonathyn Carothers A 08/06/2014, 11:13 AM

## 2014-08-06 NOTE — Patient Instructions (Addendum)
Transitional Care Appointment-08/16/14 at 10:45am  Address: [address] [place], [place] [address]

## 2014-08-06 NOTE — H&P (Signed)
[facility] History and Physical  [name] [id number] DOB: 1951-10-27 DOA: 08/06/2014  Referring physician: Varney Biles, MD PCP: Hayden Rasmussen., MD   Chief Complaint: Shortness of breath  HPI: [name] is a 63 y.o. female with CKD stage IV, chronic diastolic CHF, chronic respiratory failure on 2 L of oxygen at home and diabetes mellitus type 2, came to the hospital complaining about SOB. Patient discharged from the hospital about 5 weeks ago on 8/9 after she was admitted for chronic diastolic CHF and discharged on 20 mg of IV Lasix and oxygen. Patient did well after discharge for some time but she continues to have shortness of breath especially with exertion, for the past 2 days the SOB got worse and she started to develop orthopnea. Patient came in to the hospital for further evaluation. In the ED CXR showed vascular congestion and mild pulmonary edema, creatinine of 2.8, BNP of 1374 and she has +2 edema in the RLE patient admitted to the hospital for further evaluation.  Review of Systems:  Constitutional: negative for anorexia, fevers and sweats Eyes: negative for irritation, redness and visual disturbance Ears, nose, mouth, throat, and face: negative for earaches, epistaxis, nasal congestion and sore throat Respiratory: negative for cough, dyspnea on exertion, sputum and wheezing Cardiovascular: Exertional dyspnea, orthopnea Gastrointestinal: negative for abdominal pain, constipation, diarrhea, melena, nausea and vomiting Genitourinary:negative for dysuria, frequency and hematuria Hematologic/lymphatic: negative for bleeding, easy bruising and lymphadenopathy Musculoskeletal:negative for arthralgias, muscle weakness and stiff joints Neurological: negative for coordination problems, gait problems, headaches and weakness Endocrine: negative for diabetic symptoms including polydipsia, polyuria and weight loss Allergic/Immunologic: negative for anaphylaxis, hay  fever and urticaria  Past Medical History  Diagnosis Date  . ST elevation myocardial infarction (STEMI) of inferior wall  03/2009    Ostial/proximal RCA occlusion treated with BMS stent  . CAD (coronary artery disease), native coronary artery      status post PCI to the RCA for inferior STEMI  . Cancer     endo ca  . Hypertension associated with diabetes   . Hyperlipidemia LDL goal <70   . Diabetes mellitus, type II, insulin dependent     With complications - CAD, CVA, peripheral ulcer ,  . Stroke/cerebrovascular accident  4/30/ 2014    Right-sided weakness, mostly with balance issues and only mild weakness.  . Diabetic peripheral neuropathy associated with type 2 diabetes mellitus   . Obesity, Class III, BMI 40-49.9 (morbid obesity)     BMI 43; 5' 2',  235 pounds 6.4 ounces  . CKD (chronic kidney disease), stage IV     Recent acute on chronic exacerbation in July 2014  . PAD (peripheral artery disease)   . Diabetic foot ulcer   . Dry gangrene     Left second toe; now on Sole of L foot   Past Surgical History  Procedure Laterality Date  . Abdominal hysterectomy    . Leg tendon surgery      patient fell in a store right leg  . Leg surgery      hole in bone( during Childhood)  . Cardiac catheterization  03/31/2009    Proximal RCA thrombotic occlusion (inferior STEMI) other coronaries but in a codominant system  . Coronary angioplasty  03/31/2009    PTCA , bare-metal stent 3.5 mm x 24 mm ostium of RCA ,EF 55%  . Amputation Left 08/07/2013    Procedure: left midfoot amputation;  Surgeon: Marianna Payment, MD;  Location: [facility];  Service: Orthopedics;  Laterality: Left;  . Amputation Left 08/09/2013    Procedure: AMPUTATION BELOW KNEE;  Surgeon: Marianna Payment, MD;  Location: [facility];  Service: Orthopedics;  Laterality: Left;   Social History:   reports that she quit smoking about 19 years ago. She has never used smokeless tobacco. She reports that she does not drink alcohol  or use illicit drugs.  No Known Allergies  Family History  Problem Relation Age of Onset  . Alcoholism Mother     Died from complications  . Alcoholism Father     Died from complications  . Heart disease      Unknown, unclear. Patient is not a good historian.  Essentially no pertinent history known     Prior to Admission medications   Medication Sig Start Date End Date Taking? Authorizing Provider  amLODipine (NORVASC) 5 MG tablet Take 5 mg by mouth daily.   Yes Historical Provider, MD  aspirin 325 MG tablet Take 325 mg by mouth every morning.    Yes Historical Provider, MD  atorvastatin (LIPITOR) 40 MG tablet Take 1 tablet (40 mg total) by mouth daily at 6 PM. 06/27/14  Yes Theodis Blaze, MD  carvedilol (COREG) 25 MG tablet Take 1 tablet (25 mg total) by mouth 2 (two) times daily with a meal. 06/27/14  Yes Theodis Blaze, MD  ferrous sulfate 325 (65 FE) MG tablet Take 325 mg by mouth daily with breakfast.   Yes Historical Provider, MD  furosemide (LASIX) 40 MG tablet Take 0.5 tablets (20 mg total) by mouth daily. 06/27/14  Yes Theodis Blaze, MD  gabapentin (NEURONTIN) 100 MG capsule Take 100 mg by mouth 3 (three) times daily.   Yes Historical Provider, MD  hydrALAZINE (APRESOLINE) 50 MG tablet Take 1 tablet (50 mg total) by mouth every 8 (eight) hours. 06/27/14  Yes Theodis Blaze, MD  insulin aspart (NOVOLOG) 100 UNIT/ML injection Inject 0-15 Units into the skin 3 (three) times daily with meals. Sliding scale insulin CBG 70 - 120: 0 units: CBG 121 - 150: 2 units; CBG 151 - 200: 3 units; CBG 201 - 250: 5 units; CBG 251 - 300: 8 units;CBG 301 - 350: 11 units; CBG 351 - 400: 15 units; CBG > 400 : 15 units and notify your doctor 06/09/13  Yes Ripudeep Krystal Eaton, MD  insulin glargine (LANTUS) 100 UNIT/ML injection Inject 34 Units into the skin at bedtime.    Yes Historical Provider, MD  nystatin-triamcinolone ointment (MYCOLOG) Apply 1 application topically 2 (two) times daily.   Yes Historical Provider, MD    Omega-3 Fatty Acids (FISH OIL) 1000 MG CAPS Take 1 capsule by mouth 2 (two) times daily.   Yes Historical Provider, MD  pregabalin (LYRICA) 75 MG capsule Take 75 mg by mouth 2 (two) times daily.   Yes Historical Provider, MD   Physical Exam: Filed Vitals:   08/06/14 0837  BP: 149/52  Pulse: 66  Temp: 98 F (36.7 C)  Resp: 18   Constitutional: Oriented to person, place, and time. Well-developed and well-nourished. Cooperative.  Head: Normocephalic and atraumatic.  Nose: Nose normal.  Mouth/Throat: Uvula is midline, oropharynx is clear and moist and mucous membranes are normal.  Eyes: Conjunctivae and EOM are normal. Pupils are equal, round, and reactive to light.  Neck: Trachea normal and normal range of motion. Neck supple.  Cardiovascular: Normal rate, regular rhythm, S1 normal, S2 normal, normal heart sounds and intact distal pulses.   Pulmonary/Chest: Effort normal and breath  sounds normal.  Abdominal: Soft. Bowel sounds are normal. There is no hepatosplenomegaly. There is no tenderness.  Musculoskeletal: Normal range of motion.  Neurological: Alert and oriented to person, place, and time. Has normal strength. No cranial nerve deficit or sensory deficit.  Skin: Skin is warm, dry and intact.  Psychiatric: Has a normal mood and affect. Speech is normal and behavior is normal.   Labs on Admission:  Basic Metabolic Panel:  Recent Labs Lab 08/06/14 0503  NA 143  K 5.5*  CL 110  CO2 22  GLUCOSE 205*  BUN 79*  CREATININE 2.80*  CALCIUM 9.2   Liver Function Tests: No results found for this basename: AST, ALT, ALKPHOS, BILITOT, PROT, ALBUMIN,  in the last 168 hours No results found for this basename: LIPASE, AMYLASE,  in the last 168 hours No results found for this basename: AMMONIA,  in the last 168 hours CBC:  Recent Labs Lab 08/06/14 0503  WBC 5.9  HGB 8.8*  HCT 27.7*  MCV 89.4  PLT 152   Cardiac Enzymes: No results found for this basename: CKTOTAL, CKMB,  CKMBINDEX, TROPONINI,  in the last 168 hours  BNP (last 3 results)  Recent Labs  06/23/14 0245 06/23/14 0926 08/06/14 0503  PROBNP 1574.0* 1689.0* 1374.0*   CBG: No results found for this basename: GLUCAP,  in the last 168 hours  Radiological Exams on Admission: Dg Chest Port 1 View  08/06/2014   CLINICAL DATA:  Worsening shortness of breath.  EXAM: PORTABLE CHEST - 1 VIEW  COMPARISON:  Chest radiograph performed 06/23/2014  FINDINGS: The lungs are well-aerated. Vascular congestion is noted, with increased interstitial markings, raising concern for pulmonary edema. Small bilateral pleural effusions are suspected. There is no evidence of pneumothorax.  The cardiomediastinal silhouette is borderline normal in size. No acute osseous abnormalities are seen.  IMPRESSION: Vascular congestion, with increased interstitial markings, raising concern for pulmonary edema. Small bilateral pleural effusions suspected.   Electronically Signed   By: Garald Balding M.D.   On: 08/06/2014 05:09    EKG: Independently reviewed.   Assessment/Plan Principal Problem:   Acute on chronic diastolic CHF (congestive heart failure), NYHA class 3 Active Problems:   Anemia of chronic disease   Type II or unspecified type diabetes mellitus with renal manifestations, uncontrolled   Obesity, Class III, BMI 40-49.9 (morbid obesity)   Chronic kidney disease (CKD), stage IV (severe)   Acute pulmonary edema   S/P BKA (below knee amputation) unilateral   Chronic respiratory failure with hypoxia    Acute on chronic diastolic CHF -Presented with exertional dyspnea and orthopnea as well as lower extremity edema. -Elevated proBNP and CXR showing edema and vascular congestion. -Started on IV Lasix, restrict fluids to 1.2 L per day, daily weights and intake/output. -Continue hydralazine, Norvasc and Coreg. -2-D echo on 06/23/14 showed LVEF of 55-60% in grade 2 diastolic dysfunction, I will not repeat it.  CKD stage IV    -CKD secondary to diabetes and hypertension. Note that patient is not on ACEI or ARB at home. -It will likely complicates the diuresis, Lasix increased to 40 mg IV twice a day. -I have consulted nephrology to help with management, appreciate Dr. Elissa Hefty help.  Acute pulmonary edema -Mild pulmonary edema likely secondary to diastolic CHF, probably CKD stage IV is also contributing.  DM type II, uncontrolled -A1c was 9.0 on 06/23/2014 which correlate with mean plasma glucose of 212. -Patient is on Lantus, continue home dose, insulin sliding scale. -Started on carbohydrate  modified diet, adjust insulin according to CBGs.  Chronic respiratory failure with hypoxia -Patient is on 2 L of oxygen at home since discharge from the hospital on 06/27/14. -Continue oxygen, this is likely because of CHF.  Code Status: Full code Family Communication: Plan discussed with the patient in the presence of her daughter at bedside. Disposition Plan: Inpatient, telemetry  Time spent: 70 minutes  [facility] Pager [phone]

## 2014-08-06 NOTE — ED Notes (Signed)
A bedside commode has been set up at the bedside for Pt use.  EDP Nanavati asked for output to be measured whenever the Pt voids.

## 2014-08-06 NOTE — Progress Notes (Signed)
Transitional Care Clinic Care Coordination Note:   Admit date:  08/06/14  Discharge date: Anticipated discharge date: 08/09/14  Discharge Disposition: Home with family and home health services (? [facility] SN, resumption of [facility] PT services with [place] Go)   Patient contact information: [phone]  Emergency contact- Cecille Amsterdam (daughter)-[phone]    This Case Manager reviewed patient's EMR and determined patient would benefit from chronic care management services through the [facility]. Patient has had 3 hospital admissions in the last year.  Patient has a history of chronic diastolic congestive heart failure, chronic respiratory failure on home oxygen, diabetes mellitus type 2, s/p BKA (unilateral), CVA and presented to the hospital on 08/06/14 with shortness of breath. This Case Manager spoke with patient to discuss the services and medical management that can be provided at the [facility]. Verbal consent obtained for Transitional Care Clinic follow-up upon discharge.  Consent form will need to be signed at time of appointment. Patient aware she will receive a post-discharge transition of care phone call within 24-48 hours of discharge and will be evaluated for close follow-up for up to 30 days following discharge. Transitional Care Clinic appointment scheduled for 08/16/14 at 10:45.  Transitional Care Clinic contact number is [phone].   Patient scheduled for Transitional Care appointment on 08/16/14 at 10:45.  Clinic information and appointment time provided to patient. Clinic time and location also added to patient's discharge instructions.   Assessment:       Home Environment: Private residence-lives with daughter       Level of functioning: Needs assist with daily activities. Patient indicates she uses a walker when upstairs in home and a wheelchair at all other times. Patient receiving [place] PT services with [place].       Home DME: home oxygen, walker,  wheelchair       Home care services: Home health services with [place]       Transportation: Daughter typically provides transport as needed for patient in a private vehicle; however, patient uncertain if her daughter will be able to transport her to [facility] appointment because her daughter may be working.  Patient may need transportation to appointment on 08/16/14; registrar notified.  Will follow-up with patient after discharge to determine if transport needed to appointment.         Medications: Patient indicates she typically can afford her medications; she only has difficulty obtaining them if they are expensive.  Patient informed of [facility] pharmacy.  Patient verbalized understanding.                Identified Barriers: Possible need for transportation to clinic appointment-registrar notified. Possible medication assist if patient unable to afford discharge medications             Services communicated to Dessa Phi, RN Case Manager

## 2014-08-06 NOTE — ED Provider Notes (Signed)
CSN: [id number]     Arrival date & time 08/06/14  0434 History   First MD Initiated Contact with Patient 08/06/14 0531     Chief Complaint  Patient presents with  . Respiratory Distress     (Consider location/radiation/quality/duration/timing/severity/associated sxs/prior Treatment) HPI Comments: 63 yo woman with PMH of HTN, DM/neuropathy, diastolic CHF, CKD who presented with worsening SOB. Recently admitted to the hospital and dx with CHF, and started on lasix. Pt reports that overnight, she has had increased dib. No chest pain. + increased leg swelling, and likely 5-8 lbs of wight gain. + paroxysmal nocturnal dyspnea. Taking meds as prescribed.  The history is provided by the patient and medical records.    Past Medical History  Diagnosis Date  . ST elevation myocardial infarction (STEMI) of inferior wall  03/2009    Ostial/proximal RCA occlusion treated with BMS stent  . CAD (coronary artery disease), native coronary artery      status post PCI to the RCA for inferior STEMI  . Cancer     endo ca  . Hypertension associated with diabetes   . Hyperlipidemia LDL goal <70   . Diabetes mellitus, type II, insulin dependent     With complications - CAD, CVA, peripheral ulcer ,  . Stroke/cerebrovascular accident  4/30/ 2014    Right-sided weakness, mostly with balance issues and only mild weakness.  . Diabetic peripheral neuropathy associated with type 2 diabetes mellitus   . Obesity, Class III, BMI 40-49.9 (morbid obesity)     BMI 43; 5' 2',  235 pounds 6.4 ounces  . CKD (chronic kidney disease), stage IV     Recent acute on chronic exacerbation in July 2014  . PAD (peripheral artery disease)   . Diabetic foot ulcer   . Dry gangrene     Left second toe; now on Sole of L foot   Past Surgical History  Procedure Laterality Date  . Abdominal hysterectomy    . Leg tendon surgery      patient fell in a store right leg  . Leg surgery      hole in bone( during Childhood)  . Cardiac  catheterization  03/31/2009    Proximal RCA thrombotic occlusion (inferior STEMI) other coronaries but in a codominant system  . Coronary angioplasty  03/31/2009    PTCA , bare-metal stent 3.5 mm x 24 mm ostium of RCA ,EF 55%  . Amputation Left 08/07/2013    Procedure: left midfoot amputation;  Surgeon: Marianna Payment, MD;  Location: [facility] ORS;  Service: Orthopedics;  Laterality: Left;  . Amputation Left 08/09/2013    Procedure: AMPUTATION BELOW KNEE;  Surgeon: Marianna Payment, MD;  Location: [facility] ORS;  Service: Orthopedics;  Laterality: Left;   Family History  Problem Relation Age of Onset  . Alcoholism Mother     Died from complications  . Alcoholism Father     Died from complications  . Heart disease      Unknown, unclear. Patient is not a good historian.  Essentially no pertinent history known   History  Substance Use Topics  . Smoking status: Former Smoker    Quit date: 04/29/1995  . Smokeless tobacco: Never Used  . Alcohol Use: No   OB History   Grav Para Term Preterm Abortions TAB SAB Ect Mult Living                 Review of Systems  Constitutional: Negative for activity change.  HENT: Negative for facial swelling.  Respiratory: Positive for shortness of breath. Negative for cough and wheezing.   Cardiovascular: Positive for leg swelling. Negative for chest pain.  Gastrointestinal: Negative for nausea, vomiting, abdominal pain, diarrhea, constipation, blood in stool and abdominal distention.  Genitourinary: Negative for hematuria and difficulty urinating.  Musculoskeletal: Negative for neck pain.  Skin: Negative for color change.  Neurological: Negative for dizziness, speech difficulty, light-headedness and headaches.  Hematological: Does not bruise/bleed easily.  Psychiatric/Behavioral: Negative for confusion.      Allergies  Review of patient's allergies indicates no known allergies.  Home Medications   Prior to Admission medications   Medication Sig  Start Date End Date Taking? Authorizing Provider  amLODipine (NORVASC) 5 MG tablet Take 5 mg by mouth daily.   Yes Historical Provider, MD  aspirin 325 MG tablet Take 325 mg by mouth every morning.    Yes Historical Provider, MD  atorvastatin (LIPITOR) 40 MG tablet Take 1 tablet (40 mg total) by mouth daily at 6 PM. 06/27/14  Yes Theodis Blaze, MD  carvedilol (COREG) 25 MG tablet Take 1 tablet (25 mg total) by mouth 2 (two) times daily with a meal. 06/27/14  Yes Theodis Blaze, MD  ferrous sulfate 325 (65 FE) MG tablet Take 325 mg by mouth daily with breakfast.   Yes Historical Provider, MD  furosemide (LASIX) 40 MG tablet Take 0.5 tablets (20 mg total) by mouth daily. 06/27/14  Yes Theodis Blaze, MD  gabapentin (NEURONTIN) 100 MG capsule Take 100 mg by mouth 3 (three) times daily.   Yes Historical Provider, MD  hydrALAZINE (APRESOLINE) 50 MG tablet Take 1 tablet (50 mg total) by mouth every 8 (eight) hours. 06/27/14  Yes Theodis Blaze, MD  insulin aspart (NOVOLOG) 100 UNIT/ML injection Inject 0-15 Units into the skin 3 (three) times daily with meals. Sliding scale insulin CBG 70 - 120: 0 units: CBG 121 - 150: 2 units; CBG 151 - 200: 3 units; CBG 201 - 250: 5 units; CBG 251 - 300: 8 units;CBG 301 - 350: 11 units; CBG 351 - 400: 15 units; CBG > 400 : 15 units and notify your doctor 06/09/13  Yes Ripudeep Krystal Eaton, MD  insulin glargine (LANTUS) 100 UNIT/ML injection Inject 34 Units into the skin at bedtime.    Yes Historical Provider, MD  nystatin-triamcinolone ointment (MYCOLOG) Apply 1 application topically 2 (two) times daily.   Yes Historical Provider, MD  Omega-3 Fatty Acids (FISH OIL) 1000 MG CAPS Take 1 capsule by mouth 2 (two) times daily.   Yes Historical Provider, MD  pregabalin (LYRICA) 75 MG capsule Take 75 mg by mouth 2 (two) times daily.   Yes Historical Provider, MD   BP 149/52  Pulse 66  Temp(Src) 98 F (36.7 C) (Oral)  Resp 18  Ht 5\' 2"  (1.575 m)  Wt 267 lb 3.2 oz (121.2 kg)  BMI 48.86 kg/m2   SpO2 98% Physical Exam  Nursing note and vitals reviewed. Constitutional: She is oriented to person, place, and time. She appears well-developed and well-nourished.  HENT:  Head: Normocephalic and atraumatic.  Eyes: EOM are normal. Pupils are equal, round, and reactive to light.  Neck: Neck supple. JVD present.  Cardiovascular: Normal rate, regular rhythm and normal heart sounds.   No murmur heard. Pulmonary/Chest: Effort normal. No respiratory distress. She has rales.  Abdominal: Soft. She exhibits no distension. There is no tenderness. There is no rebound and no guarding.  Musculoskeletal: She exhibits edema.  Neurological: She is alert and  oriented to person, place, and time.  Skin: Skin is warm and dry.    ED Course  Procedures (including critical care time) Labs Review Labs Reviewed  CBC - Abnormal; Notable for the following:    RBC 3.10 (*)    Hemoglobin 8.8 (*)    HCT 27.7 (*)    RDW 15.8 (*)    All other components within normal limits  BASIC METABOLIC PANEL - Abnormal; Notable for the following:    Potassium 5.5 (*)    Glucose, Bld 205 (*)    BUN 79 (*)    Creatinine, Ser 2.80 (*)    GFR calc non Af Amer 17 (*)    GFR calc Af Amer 20 (*)    All other components within normal limits  PRO B NATRIURETIC PEPTIDE - Abnormal; Notable for the following:    Pro B Natriuretic peptide (BNP) 1374.0 (*)    All other components within normal limits  Randolm Idol, ED    Imaging Review Dg Chest Port 1 View  08/06/2014   CLINICAL DATA:  Worsening shortness of breath.  EXAM: PORTABLE CHEST - 1 VIEW  COMPARISON:  Chest radiograph performed 06/23/2014  FINDINGS: The lungs are well-aerated. Vascular congestion is noted, with increased interstitial markings, raising concern for pulmonary edema. Small bilateral pleural effusions are suspected. There is no evidence of pneumothorax.  The cardiomediastinal silhouette is borderline normal in size. No acute osseous abnormalities are  seen.  IMPRESSION: Vascular congestion, with increased interstitial markings, raising concern for pulmonary edema. Small bilateral pleural effusions suspected.   Electronically Signed   By: Garald Balding M.D.   On: 08/06/2014 05:09     EKG Interpretation None      MDM   Final diagnoses:  CKD (chronic kidney disease), stage 4 (severe)  Acute on chronic diastolic CHF (congestive heart failure), NYHA class 4  Acute pulmonary edema    Pt w/ cc of dib. Recent dx of diastolic CHF and known CKD. Exam shoes some crackles, and CXR confirms pulmonary edema. Etiology of pulm edema could be due to her CKD, or CHF, or both. Although i suspect both of the entities playing the role, i am favoring CKD, especially after looking at BUN and Cr.  Plan is to admit for optimization.    Varney Biles, MD 08/06/14 [phone]

## 2014-08-06 NOTE — ED Notes (Signed)
Per EMS pt comes from home with resp distress. Pt normally on 2L of oxygen at home. Breathe sound lower lobes fine crackles. Upper lobes sound clear. NS rhythm. Pt is alert and oriented. Pt has BKA to left leg. Denies pain. Hx CHF,diabetes, hypertension.

## 2014-08-07 DIAGNOSIS — I509 Heart failure, unspecified: Secondary | ICD-10-CM | POA: Diagnosis not present

## 2014-08-07 DIAGNOSIS — N189 Chronic kidney disease, unspecified: Secondary | ICD-10-CM

## 2014-08-07 DIAGNOSIS — I739 Peripheral vascular disease, unspecified: Secondary | ICD-10-CM

## 2014-08-07 DIAGNOSIS — I130 Hypertensive heart and chronic kidney disease with heart failure and stage 1 through stage 4 chronic kidney disease, or unspecified chronic kidney disease: Secondary | ICD-10-CM | POA: Diagnosis not present

## 2014-08-07 DIAGNOSIS — J9610 Chronic respiratory failure, unspecified whether with hypoxia or hypercapnia: Secondary | ICD-10-CM

## 2014-08-07 DIAGNOSIS — R0902 Hypoxemia: Secondary | ICD-10-CM

## 2014-08-07 DIAGNOSIS — I10 Essential (primary) hypertension: Secondary | ICD-10-CM

## 2014-08-07 DIAGNOSIS — N179 Acute kidney failure, unspecified: Secondary | ICD-10-CM

## 2014-08-07 LAB — CBC
HCT: 26.5 % — ABNORMAL LOW (ref 36.0–46.0)
Hemoglobin: 8.2 g/dL — ABNORMAL LOW (ref 12.0–15.0)
MCH: 27.9 pg (ref 26.0–34.0)
MCHC: 30.9 g/dL (ref 30.0–36.0)
MCV: 90.1 fL (ref 78.0–100.0)
PLATELETS: 149 10*3/uL — AB (ref 150–400)
RBC: 2.94 MIL/uL — ABNORMAL LOW (ref 3.87–5.11)
RDW: 15.9 % — AB (ref 11.5–15.5)
WBC: 5.9 10*3/uL (ref 4.0–10.5)

## 2014-08-07 LAB — RENAL FUNCTION PANEL
ALBUMIN: 2.9 g/dL — AB (ref 3.5–5.2)
Anion gap: 12 (ref 5–15)
BUN: 83 mg/dL — ABNORMAL HIGH (ref 6–23)
CALCIUM: 9 mg/dL (ref 8.4–10.5)
CO2: 24 mEq/L (ref 19–32)
CREATININE: 3.37 mg/dL — AB (ref 0.50–1.10)
Chloride: 105 mEq/L (ref 96–112)
GFR calc Af Amer: 16 mL/min — ABNORMAL LOW (ref >90)
GFR, EST NON AFRICAN AMERICAN: 14 mL/min — AB (ref >90)
Glucose, Bld: 267 mg/dL — ABNORMAL HIGH (ref 70–99)
PHOSPHORUS: 5.2 mg/dL — AB (ref 2.3–4.6)
Potassium: 6.2 mEq/L — ABNORMAL HIGH (ref 3.7–5.3)
Sodium: 141 mEq/L (ref 137–147)

## 2014-08-07 LAB — GLUCOSE, CAPILLARY
GLUCOSE-CAPILLARY: 203 mg/dL — AB (ref 70–99)
GLUCOSE-CAPILLARY: 273 mg/dL — AB (ref 70–99)
Glucose-Capillary: 226 mg/dL — ABNORMAL HIGH (ref 70–99)
Glucose-Capillary: 279 mg/dL — ABNORMAL HIGH (ref 70–99)

## 2014-08-07 LAB — FERRITIN: FERRITIN: 628 ng/mL — AB (ref 10–291)

## 2014-08-07 LAB — VITAMIN B12: Vitamin B-12: 705 pg/mL (ref 211–911)

## 2014-08-07 LAB — FOLATE RBC: RBC FOLATE: 1150 ng/mL — AB (ref >280)

## 2014-08-07 LAB — OCCULT BLOOD X 1 CARD TO LAB, STOOL: Fecal Occult Bld: NEGATIVE

## 2014-08-07 MED ORDER — INSULIN GLARGINE 100 UNIT/ML ~~LOC~~ SOLN
40.0000 [IU] | Freq: Every day | SUBCUTANEOUS | Status: DC
  Administered 2014-08-07 – 2014-08-09 (×3): 40 [IU] via SUBCUTANEOUS
  Filled 2014-08-07 (×3): qty 0.4

## 2014-08-07 MED ORDER — SODIUM POLYSTYRENE SULFONATE 15 GM/60ML PO SUSP
30.0000 g | Freq: Once | ORAL | Status: AC
  Administered 2014-08-07: 30 g via ORAL
  Filled 2014-08-07: qty 120

## 2014-08-07 NOTE — Progress Notes (Signed)
Patient ID: [name], female   DOB: [birth year], 63 y.o.   MRN: [id number] S:feels well O:BP 152/60  Pulse 63  Temp(Src) 97.7 F (36.5 C) (Oral)  Resp 18  Ht 5\' 2"  (1.575 m)  Wt 123.6 kg (272 lb 7.8 oz)  BMI 49.83 kg/m2  SpO2 100%  Intake/Output Summary (Last 24 hours) at 08/07/14 1211 Last data filed at 08/07/14 0900  Gross per 24 hour  Intake   1200 ml  Output   2300 ml  Net  -1100 ml   Intake/Output: I/O last 3 completed shifts: In: 1203 [P.O.:1200; I.V.:3] Out: 2450 [Urine:2450]  Intake/Output this shift:  Total I/O In: 240 [P.O.:240] Out: 50 [Urine:50] Weight change: -1.725 kg (-3 lb 12.8 oz) Gen:WD obese AAF in NAd CVS:no rub Resp:cta [id number] Ext:s/p L BKA, right leg 1+ edema   Recent Labs Lab 08/06/14 0503 08/07/14 0459  NA 143 141  K 5.5* 6.2*  CL 110 105  CO2 22 24  GLUCOSE 205* 267*  BUN 79* 83*  CREATININE 2.80* 3.37*  ALBUMIN  --  2.9*  CALCIUM 9.2 9.0  PHOS  --  5.2*   Liver Function Tests:  Recent Labs Lab 08/07/14 0459  ALBUMIN 2.9*   No results found for this basename: LIPASE, AMYLASE,  in the last 168 hours No results found for this basename: AMMONIA,  in the last 168 hours CBC:  Recent Labs Lab 08/06/14 0503 08/07/14 0459  WBC 5.9 5.9  HGB 8.8* 8.2*  HCT 27.7* 26.5*  MCV 89.4 90.1  PLT 152 149*   Cardiac Enzymes: No results found for this basename: CKTOTAL, CKMB, CKMBINDEX, TROPONINI,  in the last 168 hours CBG:  Recent Labs Lab 08/06/14 1359 08/06/14 1711 08/06/14 2124 08/07/14 0817 08/07/14 1119  GLUCAP 243* 216* 239* 203* 273*    Iron Studies:  Recent Labs  08/06/14 1500  IRON 44  TIBC 275  FERRITIN 628*   Studies/Results: Dg Chest Port 1 View  08/06/2014   CLINICAL DATA:  Worsening shortness of breath.  EXAM: PORTABLE CHEST - 1 VIEW  COMPARISON:  Chest radiograph performed 06/23/2014  FINDINGS: The lungs are well-aerated. Vascular congestion is noted, with increased interstitial markings, raising  concern for pulmonary edema. Small bilateral pleural effusions are suspected. There is no evidence of pneumothorax.  The cardiomediastinal silhouette is borderline normal in size. No acute osseous abnormalities are seen.  IMPRESSION: Vascular congestion, with increased interstitial markings, raising concern for pulmonary edema. Small bilateral pleural effusions suspected.   Electronically Signed   By: Garald Balding M.D.   On: 08/06/2014 05:09   . amLODipine  5 mg Oral Daily  . aspirin  325 mg Oral q morning - 10a  . atorvastatin  40 mg Oral q1800  . carvedilol  25 mg Oral BID WC  . ferrous sulfate  325 mg Oral Q breakfast  . furosemide  40 mg Intravenous BID  . gabapentin  100 mg Oral TID  . heparin  5,000 Units Subcutaneous 3 times per day  . hydrALAZINE  50 mg Oral 3 times per day  . insulin aspart  0-9 Units Subcutaneous TID WC  . insulin glargine  34 Units Subcutaneous QHS  . sodium chloride  3 mL Intravenous Q12H    BMET    Component Value Date/Time   NA 141 08/07/2014 0459   K 6.2* 08/07/2014 0459   CL 105 08/07/2014 0459   CO2 24 08/07/2014 0459   GLUCOSE 267* 08/07/2014 0459   BUN 83*  08/07/2014 0459   CREATININE 3.37* 08/07/2014 0459   CALCIUM 9.0 08/07/2014 0459   GFRNONAA 14* 08/07/2014 0459   GFRAA 16* 08/07/2014 0459   CBC    Component Value Date/Time   WBC 5.9 08/07/2014 0459   RBC 2.94* 08/07/2014 0459   RBC 2.43* 08/08/2013 1512   HGB 8.2* 08/07/2014 0459   HCT 26.5* 08/07/2014 0459   PLT 149* 08/07/2014 0459   MCV 90.1 08/07/2014 0459   MCH 27.9 08/07/2014 0459   MCHC 30.9 08/07/2014 0459   RDW 15.9* 08/07/2014 0459   LYMPHSABS 1.7 06/23/2014 0245   MONOABS 0.6 06/23/2014 0245   EOSABS 0.3 06/23/2014 0245   BASOSABS 0.0 06/23/2014 0245     Assessment/Plan:  1. 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 Discussed the ways to delay the progression of CKD with tight BP control (goal <130/80),  tight glucose control (goal HgbA1c ,7%), use of ACE/ARB (on hold due to multiple episodes of AKI/CKD), avoidance of nephrotoxic agents such as NSAIDs/Cox-II I's/IV contrast, as well as sodium and fluid restriction to minimize cardiorenal syndrome 2. CHF- agree with higher dose of lasix and need for education on sodium and fluid restriction as well as weight loss. 1. Diuresing well but will need significant lifestyle changes 3. Hyperkalemia- cont with IV lasix and add kayexalate 30gm x 1 recheck in am 4. HTN- stable 5. Anemia- likely due to [facility]- will check iron stores and guaiac stools. May benefit from epo 6. DM- per primary team 7.   [place] A

## 2014-08-07 NOTE — Progress Notes (Signed)
[facility] PROGRESS NOTE   [name] [id number] DOB: 1951-04-08 DOA: 08/06/2014 PCP: Hayden Rasmussen., MD  HPI/Subjective: Feels better, has lower extremity edema, less shortness of breath.  Assessment/Plan: Principal Problem:   Acute on chronic diastolic CHF (congestive heart failure), NYHA class 3 Active Problems:   Anemia of chronic disease   Type II or unspecified type diabetes mellitus with renal manifestations, uncontrolled   Obesity, Class III, BMI 40-49.9 (morbid obesity)   Chronic kidney disease (CKD), stage IV (severe)   Acute pulmonary edema   S/P BKA (below knee amputation) unilateral   Chronic respiratory failure with hypoxia    Acute on chronic diastolic CHF  -Presented with exertional dyspnea and orthopnea as well as lower extremity edema.  -Elevated proBNP and CXR showing edema and vascular congestion.  -Started on IV Lasix, restrict fluids to 1.2 L per day, daily weights and intake/output.  -Continue hydralazine, Norvasc and Coreg.  -2-D echo on 06/23/14 showed LVEF of 55-60% in grade 2 diastolic dysfunction, I will not repeat it.   CKD stage IV  -CKD secondary to diabetes and hypertension. Note that patient is not on ACEI or ARB at home.  -It will likely complicates the diuresis, Lasix increased to 40 mg IV twice a day.  -I appreciate Dr.Coldanato's help. -Presented with creatinine of 2.8, creatinine increased to 3.3 today after diuresis.  Acute pulmonary edema  -Mild pulmonary edema likely secondary to diastolic CHF, probably CKD stage IV is also contributing.   DM type II, uncontrolled  -A1c was 9.0 on 06/23/2014 which correlate with mean plasma glucose of 212.  -Patient is on Lantus, continue home dose, insulin sliding scale.  -We will increase Lantus dose to 40 units at night.  Chronic respiratory failure with hypoxia  -Patient is on 2 L of oxygen at home since discharge from the hospital on 06/27/14.  -Continue oxygen, I will try to  wean o oxygen off.  Hyperkalemia -Secondary to CKD, Kayexalate to be given.   Code Status: Full code Family Communication: Plan discussed with the patient. Disposition Plan: Remains inpatient   Consultants:  Nephrology  Procedures:  None  Antibiotics:  None   Objective: Filed Vitals:   08/07/14 0531  BP: 152/60  Pulse: 63  Temp: 97.7 F (36.5 C)  Resp: 18    Intake/Output Summary (Last 24 hours) at 08/07/14 1226 Last data filed at 08/07/14 0900  Gross per 24 hour  Intake   1200 ml  Output   2300 ml  Net  -1100 ml   Filed Weights   08/06/14 0445 08/06/14 0837 08/07/14 0531  Weight: 122.925 kg (271 lb) 121.2 kg (267 lb 3.2 oz) 123.6 kg (272 lb 7.8 oz)    Exam: General: Alert and awake, oriented x3, not in any acute distress. HEENT: anicteric sclera, pupils reactive to light and accommodation, EOMI CVS: S1-S2 clear, no murmur rubs or gallops Chest: clear to auscultation bilaterally, no wheezing, rales or rhonchi Abdomen: soft nontender, nondistended, normal bowel sounds, no organomegaly Extremities: no cyanosis, clubbing or edema noted bilaterally Neuro: Cranial nerves II-XII intact, no focal neurological deficits  Data Reviewed: Basic Metabolic Panel:  Recent Labs Lab 08/06/14 0503 08/07/14 0459  NA 143 141  K 5.5* 6.2*  CL 110 105  CO2 22 24  GLUCOSE 205* 267*  BUN 79* 83*  CREATININE 2.80* 3.37*  CALCIUM 9.2 9.0  PHOS  --  5.2*   Liver Function Tests:  Recent Labs Lab 08/07/14 0459  ALBUMIN 2.9*   No  results found for this basename: LIPASE, AMYLASE,  in the last 168 hours No results found for this basename: AMMONIA,  in the last 168 hours CBC:  Recent Labs Lab 08/06/14 0503 08/07/14 0459  WBC 5.9 5.9  HGB 8.8* 8.2*  HCT 27.7* 26.5*  MCV 89.4 90.1  PLT 152 149*   Cardiac Enzymes: No results found for this basename: CKTOTAL, CKMB, CKMBINDEX, TROPONINI,  in the last 168 hours BNP (last 3 results)  Recent Labs   06/23/14 0245 06/23/14 0926 08/06/14 0503  PROBNP 1574.0* 1689.0* 1374.0*   CBG:  Recent Labs Lab 08/06/14 1359 08/06/14 1711 08/06/14 2124 08/07/14 0817 08/07/14 1119  GLUCAP 243* 216* 239* 203* 273*    Micro No results found for this or any previous visit (from the past 240 hour(s)).   Studies: Dg Chest Port 1 View  08/06/2014   CLINICAL DATA:  Worsening shortness of breath.  EXAM: PORTABLE CHEST - 1 VIEW  COMPARISON:  Chest radiograph performed 06/23/2014  FINDINGS: The lungs are well-aerated. Vascular congestion is noted, with increased interstitial markings, raising concern for pulmonary edema. Small bilateral pleural effusions are suspected. There is no evidence of pneumothorax.  The cardiomediastinal silhouette is borderline normal in size. No acute osseous abnormalities are seen.  IMPRESSION: Vascular congestion, with increased interstitial markings, raising concern for pulmonary edema. Small bilateral pleural effusions suspected.   Electronically Signed   By: Garald Balding M.D.   On: 08/06/2014 05:09    Scheduled Meds: . amLODipine  5 mg Oral Daily  . aspirin  325 mg Oral q morning - 10a  . atorvastatin  40 mg Oral q1800  . carvedilol  25 mg Oral BID WC  . ferrous sulfate  325 mg Oral Q breakfast  . furosemide  40 mg Intravenous BID  . gabapentin  100 mg Oral TID  . heparin  5,000 Units Subcutaneous 3 times per day  . hydrALAZINE  50 mg Oral 3 times per day  . insulin aspart  0-9 Units Subcutaneous TID WC  . insulin glargine  34 Units Subcutaneous QHS  . sodium chloride  3 mL Intravenous Q12H  . sodium polystyrene  30 g Oral Once   Continuous Infusions:      Time spent: 35 minutes    [facility] A  [facility] Pager [phone] If 7PM-7AM, please contact night-coverage at [email or web address], password [facility] 08/07/2014, 12:26 PM  LOS: 1 day

## 2014-08-08 DIAGNOSIS — D631 Anemia in chronic kidney disease: Secondary | ICD-10-CM

## 2014-08-08 DIAGNOSIS — N039 Chronic nephritic syndrome with unspecified morphologic changes: Secondary | ICD-10-CM

## 2014-08-08 LAB — RENAL FUNCTION PANEL
Albumin: 2.9 g/dL — ABNORMAL LOW (ref 3.5–5.2)
Anion gap: 10 (ref 5–15)
BUN: 83 mg/dL — ABNORMAL HIGH (ref 6–23)
CALCIUM: 9.2 mg/dL (ref 8.4–10.5)
CO2: 27 mEq/L (ref 19–32)
CREATININE: 3.09 mg/dL — AB (ref 0.50–1.10)
Chloride: 106 mEq/L (ref 96–112)
GFR calc Af Amer: 17 mL/min — ABNORMAL LOW (ref >90)
GFR calc non Af Amer: 15 mL/min — ABNORMAL LOW (ref >90)
GLUCOSE: 166 mg/dL — AB (ref 70–99)
PHOSPHORUS: 4.9 mg/dL — AB (ref 2.3–4.6)
Potassium: 4.6 mEq/L (ref 3.7–5.3)
Sodium: 143 mEq/L (ref 137–147)

## 2014-08-08 LAB — CBC
HEMATOCRIT: 26.1 % — AB (ref 36.0–46.0)
HEMOGLOBIN: 8.1 g/dL — AB (ref 12.0–15.0)
MCH: 27.8 pg (ref 26.0–34.0)
MCHC: 31 g/dL (ref 30.0–36.0)
MCV: 89.7 fL (ref 78.0–100.0)
Platelets: 165 10*3/uL (ref 150–400)
RBC: 2.91 MIL/uL — ABNORMAL LOW (ref 3.87–5.11)
RDW: 16 % — ABNORMAL HIGH (ref 11.5–15.5)
WBC: 5.1 10*3/uL (ref 4.0–10.5)

## 2014-08-08 LAB — GLUCOSE, CAPILLARY
GLUCOSE-CAPILLARY: 225 mg/dL — AB (ref 70–99)
Glucose-Capillary: 132 mg/dL — ABNORMAL HIGH (ref 70–99)
Glucose-Capillary: 174 mg/dL — ABNORMAL HIGH (ref 70–99)
Glucose-Capillary: 242 mg/dL — ABNORMAL HIGH (ref 70–99)

## 2014-08-08 MED ORDER — INSULIN ASPART 100 UNIT/ML ~~LOC~~ SOLN
5.0000 [IU] | Freq: Three times a day (TID) | SUBCUTANEOUS | Status: DC
  Administered 2014-08-08 – 2014-08-10 (×6): 5 [IU] via SUBCUTANEOUS

## 2014-08-08 NOTE — Progress Notes (Signed)
Patient ID: [name], female   DOB: 1951/04/23, 63 y.o.   MRN: [id number] S:feels better O:BP 145/61  Pulse 68  Temp(Src) 97.8 F (36.6 C) (Oral)  Resp 22  Ht 5\' 2"  (1.575 m)  Wt 123.1 kg (271 lb 6.2 oz)  BMI 49.62 kg/m2  SpO2 99%  Intake/Output Summary (Last 24 hours) at 08/08/14 0935 Last data filed at 08/08/14 [id number]  Gross per 24 hour  Intake    840 ml  Output   2201 ml  Net  -1361 ml   Intake/Output: I/O last 3 completed shifts: In: 1560 [P.O.:1560] Out: 2601 [Urine:2600; Stool:1]  Intake/Output this shift:  Total I/O In: -  Out: 250 [Urine:250] Weight change: 1.9 kg (4 lb 3 oz) Gen:WD obese AAF in NAD CVS:no rub Resp:cta [id number] +BS, soft Ext:+edema   Recent Labs Lab 08/06/14 0503 08/07/14 0459 08/08/14 0438  NA 143 141 143  K 5.5* 6.2* 4.6  CL 110 105 106  CO2 22 24 27   GLUCOSE 205* 267* 166*  BUN 79* 83* 83*  CREATININE 2.80* 3.37* 3.09*  ALBUMIN  --  2.9* 2.9*  CALCIUM 9.2 9.0 9.2  PHOS  --  5.2* 4.9*   Liver Function Tests:  Recent Labs Lab 08/07/14 0459 08/08/14 0438  ALBUMIN 2.9* 2.9*   No results found for this basename: LIPASE, AMYLASE,  in the last 168 hours No results found for this basename: AMMONIA,  in the last 168 hours CBC:  Recent Labs Lab 08/06/14 0503 08/07/14 0459 08/08/14 0438  WBC 5.9 5.9 5.1  HGB 8.8* 8.2* 8.1*  HCT 27.7* 26.5* 26.1*  MCV 89.4 90.1 89.7  PLT 152 149* 165   Cardiac Enzymes: No results found for this basename: CKTOTAL, CKMB, CKMBINDEX, TROPONINI,  in the last 168 hours CBG:  Recent Labs Lab 08/07/14 0817 08/07/14 1119 08/07/14 1632 08/07/14 2144 08/08/14 0717  GLUCAP 203* 273* 226* 279* 132*    Iron Studies:  Recent Labs  08/06/14 1500  IRON 44  TIBC 275  FERRITIN 628*   Studies/Results: No results found. Marland Kitchen amLODipine  5 mg Oral Daily  . aspirin  325 mg Oral q morning - 10a  . atorvastatin  40 mg Oral q1800  . carvedilol  25 mg Oral BID WC  . ferrous sulfate  325 mg Oral  Q breakfast  . furosemide  40 mg Intravenous BID  . gabapentin  100 mg Oral TID  . heparin  5,000 Units Subcutaneous 3 times per day  . hydrALAZINE  50 mg Oral 3 times per day  . insulin aspart  0-9 Units Subcutaneous TID WC  . insulin glargine  40 Units Subcutaneous QHS  . sodium chloride  3 mL Intravenous Q12H    BMET    Component Value Date/Time   NA 143 08/08/2014 0438   K 4.6 08/08/2014 0438   CL 106 08/08/2014 0438   CO2 27 08/08/2014 0438   GLUCOSE 166* 08/08/2014 0438   BUN 83* 08/08/2014 0438   CREATININE 3.09* 08/08/2014 0438   CALCIUM 9.2 08/08/2014 0438   GFRNONAA 15* 08/08/2014 0438   GFRAA 17* 08/08/2014 0438   CBC    Component Value Date/Time   WBC 5.1 08/08/2014 0438   RBC 2.91* 08/08/2014 0438   RBC 2.43* 08/08/2013 1512   HGB 8.1* 08/08/2014 0438   HCT 26.1* 08/08/2014 0438   PLT 165 08/08/2014 0438   MCV 89.7 08/08/2014 0438   MCH 27.8 08/08/2014 0438   MCHC 31.0 08/08/2014 0438  RDW 16.0* 08/08/2014 0438   LYMPHSABS 1.7 06/23/2014 0245   MONOABS 0.6 06/23/2014 0245   EOSABS 0.3 06/23/2014 0245   BASOSABS 0.0 06/23/2014 0245     Assessment/Plan:  1. 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 Discussed the ways to delay the progression of CKD with tight BP control (goal <130/80), tight glucose control (goal HgbA1c ,7%), use of ACE/ARB (on hold due to multiple episodes of AKI/CKD), avoidance of nephrotoxic agents such as NSAIDs/Cox-II I's/IV contrast, as well as sodium and fluid restriction to minimize cardiorenal syndrome 1. Scr improved despite IV diuresis. 2. Cont to educate on low Na diet and fluid restriction 3. Pt has outpt appointment with Dr. Moshe Cipro on 08/19/14 at 8:00am 2. CHF- agree with higher dose of lasix and need for education on sodium and fluid restriction as well as weight loss.  1. Diuresing well but will need significant lifestyle changes 2. Continue with IV  diuresis 3. Hyperkalemia- cont with IV lasix and improved with kayexalate 30gm x 1 08/07/14. 4. HTN- stable 5. Anemia- likely due to [facility]- will check iron stores and guaiac stools. May benefit from epo 6. DM- per primary team 7. Dispo- per primary svc  Odyn Turko A

## 2014-08-08 NOTE — Progress Notes (Signed)
[facility] PROGRESS NOTE   [name] [id number] DOB: 21-Nov-1950 DOA: 08/06/2014 PCP: Hayden Rasmussen., MD  HPI/Subjective: Feels better overall, less SOB and LEE  Assessment/Plan: Principal Problem:   Acute on chronic diastolic CHF (congestive heart failure), NYHA class 3 Active Problems:   Anemia of chronic disease   Type II or unspecified type diabetes mellitus with renal manifestations, uncontrolled   Obesity, Class III, BMI 40-49.9 (morbid obesity)   Chronic kidney disease (CKD), stage IV (severe)   Acute pulmonary edema   S/P BKA (below knee amputation) unilateral   Chronic respiratory failure with hypoxia    Acute on chronic diastolic CHF  -Presented with exertional dyspnea and orthopnea as well as lower extremity edema.  -Elevated proBNP and CXR showing edema and vascular congestion.  -Started on IV Lasix, restrict fluids to 1.2 L per day, daily weights and intake/output.  -Continue hydralazine, Norvasc and Coreg.  -2-D echo on 06/23/14 showed LVEF of 55-60% in grade 2 diastolic dysfunction, I will not repeat it.  -Reasonable urine output, continue IV Lasix. Will ambulate.  CKD stage IV  -CKD secondary to diabetes and hypertension. Note that patient is not on ACEI or ARB at home.  -It will likely complicates the diuresis, Lasix increased to 40 mg IV twice a day.  -I appreciate Dr.Coldanato's help. -Presented with creatinine of 2.8, creatinine increased to 3.3 today after diuresis started. -Creatinine improved to 3.0, likely because of better preload and blood pressure after the diuresis  Acute pulmonary edema  -Mild pulmonary edema likely secondary to diastolic CHF, probably CKD stage IV is also contributing.   DM type II, uncontrolled  -A1c was 9.0 on 06/23/2014 which correlate with mean plasma glucose of 212.  -Patient is on Lantus, continue home dose, insulin sliding scale.  -We will increase Lantus dose to 40 units at night.  Chronic respiratory  failure with hypoxia  -Patient is on 2 L of oxygen at home since discharge from the hospital on 06/27/14.  -Continue oxygen, I will try to wean o oxygen off.  Hyperkalemia -Secondary to CKD, Kayexalate to be given.   Code Status: Full code Family Communication: Plan discussed with the patient. Disposition Plan: Remains inpatient   Consultants:  Nephrology  Procedures:  None  Antibiotics:  None   Objective: Filed Vitals:   08/08/14 1336  BP: 126/50  Pulse: 68  Temp: 97.4 F (36.3 C)  Resp: 18    Intake/Output Summary (Last 24 hours) at 08/08/14 1356 Last data filed at 08/08/14 1337  Gross per 24 hour  Intake    980 ml  Output   2301 ml  Net  -1321 ml   Filed Weights   08/06/14 0837 08/07/14 0531 08/08/14 0356  Weight: 121.2 kg (267 lb 3.2 oz) 123.6 kg (272 lb 7.8 oz) 123.1 kg (271 lb 6.2 oz)    Exam: General: Alert and awake, oriented x3, not in any acute distress. HEENT: anicteric sclera, pupils reactive to light and accommodation, EOMI CVS: S1-S2 clear, no murmur rubs or gallops Chest: clear to auscultation bilaterally, no wheezing, rales or rhonchi Abdomen: soft nontender, nondistended, normal bowel sounds, no organomegaly Extremities: no cyanosis, clubbing or edema noted bilaterally Neuro: Cranial nerves II-XII intact, no focal neurological deficits  Data Reviewed: Basic Metabolic Panel:  Recent Labs Lab 08/06/14 0503 08/07/14 0459 08/08/14 0438  NA 143 141 143  K 5.5* 6.2* 4.6  CL 110 105 106  CO2 22 24 27   GLUCOSE 205* 267* 166*  BUN 79* 83*  83*  CREATININE 2.80* 3.37* 3.09*  CALCIUM 9.2 9.0 9.2  PHOS  --  5.2* 4.9*   Liver Function Tests:  Recent Labs Lab 08/07/14 0459 08/08/14 0438  ALBUMIN 2.9* 2.9*   No results found for this basename: LIPASE, AMYLASE,  in the last 168 hours No results found for this basename: AMMONIA,  in the last 168 hours CBC:  Recent Labs Lab 08/06/14 0503 08/07/14 0459 08/08/14 0438  WBC 5.9 5.9  5.1  HGB 8.8* 8.2* 8.1*  HCT 27.7* 26.5* 26.1*  MCV 89.4 90.1 89.7  PLT 152 149* 165   Cardiac Enzymes: No results found for this basename: CKTOTAL, CKMB, CKMBINDEX, TROPONINI,  in the last 168 hours BNP (last 3 results)  Recent Labs  06/23/14 0245 06/23/14 0926 08/06/14 0503  PROBNP 1574.0* 1689.0* 1374.0*   CBG:  Recent Labs Lab 08/07/14 1119 08/07/14 1632 08/07/14 2144 08/08/14 0717 08/08/14 1146  GLUCAP 273* 226* 279* 132* 174*    Micro No results found for this or any previous visit (from the past 240 hour(s)).   Studies: No results found.  Scheduled Meds: . amLODipine  5 mg Oral Daily  . aspirin  325 mg Oral q morning - 10a  . atorvastatin  40 mg Oral q1800  . carvedilol  25 mg Oral BID WC  . ferrous sulfate  325 mg Oral Q breakfast  . furosemide  40 mg Intravenous BID  . gabapentin  100 mg Oral TID  . heparin  5,000 Units Subcutaneous 3 times per day  . hydrALAZINE  50 mg Oral 3 times per day  . insulin aspart  0-9 Units Subcutaneous TID WC  . insulin glargine  40 Units Subcutaneous QHS  . sodium chloride  3 mL Intravenous Q12H   Continuous Infusions:      Time spent: 35 minutes    [facility] A  [facility] Pager [phone] If 7PM-7AM, please contact night-coverage at [email or web address], password [facility] 08/08/2014, 1:56 PM  LOS: 2 days

## 2014-08-09 LAB — RENAL FUNCTION PANEL
ALBUMIN: 2.8 g/dL — AB (ref 3.5–5.2)
ANION GAP: 11 (ref 5–15)
BUN: 78 mg/dL — AB (ref 6–23)
CHLORIDE: 107 meq/L (ref 96–112)
CO2: 27 mEq/L (ref 19–32)
CREATININE: 2.88 mg/dL — AB (ref 0.50–1.10)
Calcium: 9 mg/dL (ref 8.4–10.5)
GFR calc Af Amer: 19 mL/min — ABNORMAL LOW (ref >90)
GFR calc non Af Amer: 16 mL/min — ABNORMAL LOW (ref >90)
GLUCOSE: 164 mg/dL — AB (ref 70–99)
POTASSIUM: 4.8 meq/L (ref 3.7–5.3)
Phosphorus: 4.6 mg/dL (ref 2.3–4.6)
Sodium: 145 mEq/L (ref 137–147)

## 2014-08-09 LAB — GLUCOSE, CAPILLARY
GLUCOSE-CAPILLARY: 123 mg/dL — AB (ref 70–99)
GLUCOSE-CAPILLARY: 302 mg/dL — AB (ref 70–99)
Glucose-Capillary: 133 mg/dL — ABNORMAL HIGH (ref 70–99)
Glucose-Capillary: 208 mg/dL — ABNORMAL HIGH (ref 70–99)

## 2014-08-09 NOTE — Evaluation (Signed)
Physical Therapy Evaluation Patient Details Name: [name] MRN: [id number] DOB: 1951/09/27 Today's Date: 08/09/2014   History of Present Illness  Pt is an 63 y.o. female with CKD stage IV, chronic diastolic CHF, chronic respiratory failure on 2 L of oxygen at home, diabetes mellitus type 2, and L BKA, admitted with SOB especially DOE, diagnosed with CHF.  Clinical Impression  Pt currently with functional limitations due to the deficits listed below (see PT Problem List). Pt will benefit from skilled PT to increase their independence and safety with mobility to allow discharge to the venue listed below.  Pt reports working with HHPT but does not receive any other home care.  Pt states daughter does not leave her often so she can assist pt.  Pt would benefit from ST-SNF upon d/c due to recent admission, dyspnea, deconditioning, weakness.  However if pt not agreeable, would recommend continuing HHPT, 24/7 assist and possibly home health aide to assist pt.     Follow Up Recommendations SNF;Supervision for mobility/OOB    Equipment Recommendations  None recommended by PT    Recommendations for Other Services       Precautions / Restrictions Precautions Precautions: Fall Precaution Comments: DOE      Mobility  Bed Mobility Overal bed mobility: Modified Independent Bed Mobility: Supine to Sit     Supine to sit: Modified independent (Device/Increase time)     General bed mobility comments: pt sleeping with head at bottom of bed (reverse position) upon entering room and able to sit upright with increased time, pt reports daughter usually dons prosthesis but able to verbally instruct PT on how to don while PT performed today  Transfers Overall transfer level: Needs assistance Equipment used: Rolling walker (2 wheeled) Transfers: Sit to/from [facility] Sit to Stand: Min guard Stand pivot transfers: Min guard       General transfer comment: pt requested use of  BSC, min/guard for safety  Ambulation/Gait Ambulation/Gait assistance: Min guard Ambulation Distance (Feet): 20 Feet Assistive device: Rolling walker (2 wheeled) Gait Pattern/deviations: Step-through pattern;Wide base of support;Decreased stride length Gait velocity: decr   General Gait Details: verbal cues for RW positioning, multiple short standing rest breaks with pt resting forearms on RW required, pt reports fatigue and increase in LE pain so recliner followed  Stairs            Wheelchair Mobility    Modified Rankin (Stroke Patients Only)       Balance                                             Pertinent Vitals/Pain Pain Assessment: 0-10 Pain Location: reports bil LE pain when ambulating, not rated Pain Descriptors / Indicators: Aching Pain Intervention(s): Limited activity within patient's tolerance;Monitored during session;Repositioned (had pt sit in recliner)    Home Living Family/patient expects to be discharged to:: Private residence Living Arrangements: Children (daughter) Available Help at Discharge: Family Type of Home: House Home Access: Stairs to enter Entrance Stairs-Rails: None Entrance Stairs-Number of Steps: 4 Home Layout: Two level;Bed/bath upstairs;Laundry or work area in [employer]: Environmental consultant - 2 wheels;Tub bench;Bedside commode;Wheelchair - manual;Hospital bed      Prior Function Level of Independence: Needs assistance   Gait / Transfers Assistance Needed: pt reports she needs assist to go down stairs, does better with going up steps, also states daughter dons  prosthesis, typically able to mobilize with supervision           Hand Dominance        Extremity/Trunk Assessment   Upper Extremity Assessment: Generalized weakness           Lower Extremity Assessment: Generalized weakness;LLE deficits/detail   LLE Deficits / Details: hx L BKA     Communication   Communication: No difficulties   Cognition Arousal/Alertness: Awake/alert Behavior During Therapy: WFL for tasks assessed/performed Overall Cognitive Status: Within Functional Limits for tasks assessed                      General Comments      Exercises        Assessment/Plan    PT Assessment Patient needs continued PT services  PT Diagnosis Difficulty walking;Generalized weakness   PT Problem List Decreased strength;Decreased activity tolerance;Decreased mobility;Cardiopulmonary status limiting activity;Decreased knowledge of use of DME  PT Treatment Interventions DME instruction;Gait training;Functional mobility training;Patient/family education;Therapeutic activities;Therapeutic exercise;Wheelchair mobility training   PT Goals (Current goals can be found in the Care Plan section) Acute Rehab PT Goals Patient Stated Goal: get home back PT Goal Formulation: With patient Time For Goal Achievement: 08/16/14 Potential to Achieve Goals: Good    Frequency Min 3X/week   Barriers to discharge        Co-evaluation               End of Session Equipment Utilized During Treatment: Oxygen Activity Tolerance: Patient limited by fatigue Patient left: in chair;with call bell/phone within reach           Time: 1511-1536 PT Time Calculation (min): 25 min   Charges:   PT Evaluation $Initial PT Evaluation Tier I: 1 Procedure PT Treatments $Gait Training: 8-22 mins $Therapeutic Activity: 8-22 mins   PT G Codes:          [name],[name] 08/09/2014, 4:28 PM Carmelia Bake, PT, DPT 08/09/2014 Pager: [phone]

## 2014-08-09 NOTE — Progress Notes (Signed)
  [facility] Progress Note   Subjective: "I'm feeling better, my breathing is better"  Filed Vitals:   08/08/14 0615 08/08/14 1336 08/08/14 2010 08/09/14 0417  BP: 145/61 126/50 152/64 129/51  Pulse: 68 68 66 65  Temp: 97.8 F (36.6 C) 97.4 F (36.3 C) 98.5 F (36.9 C) 98.5 F (36.9 C)  TempSrc: Oral Oral Oral Oral  Resp: 22 18 20 20   Height:      Weight:    124.3 kg (274 lb 0.5 oz)  SpO2: 99% 98% 100% 99%   Exam: WD obese AAF in NAD  CVS: no rub  Resp: clear bilat [id number] +BS, soft  Ext 1-2+ pitting edema R pretib, L BKA Neuro is alert and Ox 3      ECHO EF 50-55%, no other sig findings UA negative  Assessment: 1 AKI d/t decomp CHF - improved w diuresis 2 CKD stage 4 - baseline creat around 2.1, f/b CKA 3 Hyperkalemia resolved 4 HTN 5 Vol excess - 4 L net negative diuresis since admission  Plan- doing much better. She doesn't think she was taking lasix at home; home med list has 1/2 of a 40 mg lasix daily.  She will need higher dose of home lasix, I would recommend 40 mg bid for starters.  She has f/u w Dr Moshe Cipro in October, I gave her the number to call for details if needed. Will sign off , please call if needed.     Kelly Splinter MD  pager [phone]    cell [phone]  08/09/2014, 8:47 AM     Recent Labs Lab 08/07/14 0459 08/08/14 0438 08/09/14 0413  NA 141 143 145  K 6.2* 4.6 4.8  CL 105 106 107  CO2 24 27 27   GLUCOSE 267* 166* 164*  BUN 83* 83* 78*  CREATININE 3.37* 3.09* 2.88*  CALCIUM 9.0 9.2 9.0  PHOS 5.2* 4.9* 4.6    Recent Labs Lab 08/07/14 0459 08/08/14 0438 08/09/14 0413  ALBUMIN 2.9* 2.9* 2.8*    Recent Labs Lab 08/06/14 0503 08/07/14 0459 08/08/14 0438  WBC 5.9 5.9 5.1  HGB 8.8* 8.2* 8.1*  HCT 27.7* 26.5* 26.1*  MCV 89.4 90.1 89.7  PLT 152 149* 165   . amLODipine  5 mg Oral Daily  . aspirin  325 mg Oral q morning - 10a  . atorvastatin  40 mg Oral q1800  . carvedilol  25 mg Oral BID WC  . ferrous  sulfate  325 mg Oral Q breakfast  . furosemide  40 mg Intravenous BID  . gabapentin  100 mg Oral TID  . heparin  5,000 Units Subcutaneous 3 times per day  . hydrALAZINE  50 mg Oral 3 times per day  . insulin aspart  0-9 Units Subcutaneous TID WC  . insulin aspart  5 Units Subcutaneous TID WC  . insulin glargine  40 Units Subcutaneous QHS  . sodium chloride  3 mL Intravenous Q12H     acetaminophen, acetaminophen, alum & mag hydroxide-simeth, HYDROcodone-acetaminophen, morphine injection, ondansetron (ZOFRAN) IV, ondansetron

## 2014-08-09 NOTE — Evaluation (Signed)
Occupational Therapy Evaluation Patient Details Name: [name] MRN: [id number] DOB: 17-Oct-1951 Today's Date: 08/09/2014    History of Present Illness 63 y.o. female with CKD stage IV, chronic diastolic CHF, chronic respiratory failure on 2 L of oxygen at home and diabetes mellitus type 2, came to the hospital complaining about SOB. Patient discharged from the hospital about 5 weeks ago on 8/9 after she was admitted for chronic diastolic CHF and discharged on 20 mg of IV Lasix and oxygen. Patient did well after discharge for some time but she continues to have shortness of breath especially with exertion, for the past 2 days the SOB got worse and she started to develop orthopnea. Patient came in to the hospital for further evaluation.   Clinical Impression   Pt presents to OT with decreased I with ADL activity due to problems below. Pt will benefit from skilled OT to increase I with ADL activity and return to PLOF    Follow Up Recommendations  Home health OT;Supervision/Assistance - 24 hour    Equipment Recommendations  None recommended by OT           Mobility Bed Mobility Overal bed mobility: Needs Assistance Bed Mobility: Supine to Sit     Supine to sit: Mod assist        Transfers Overall transfer level: Needs assistance               General transfer comment: did not transfer. pt does not have shoe for R foot and did not want OT to help pt don leg for LLE         ADL Overall ADL's : Needs assistance/impaired Eating/Feeding: Set up;Sitting   Grooming: Minimal assistance;Sitting   Upper Body Bathing: Minimal assitance;Sitting   Lower Body Bathing: Sitting/lateral leans;Maximal assistance   Upper Body Dressing : Minimal assistance;Sitting   Lower Body Dressing: Maximal assistance;Sitting/lateral leans                 General ADL Comments: pt ask family to bring shoe for r foot     Vision                            Pertinent  Vitals/Pain Pain Assessment: No/denies pain     Hand Dominance     Extremity/Trunk Assessment Upper Extremity Assessment Upper Extremity Assessment: Generalized weakness           Communication Communication Communication: No difficulties;Other (comment) (pt speaks quietly)   Cognition Arousal/Alertness: Awake/alert Behavior During Therapy: WFL for tasks assessed/performed Overall Cognitive Status: Within Functional Limits for tasks assessed                     General Comments    Pt wanted daughter present to don leg.  Pt also concerned she did not have her shoe. Pt called and ask family to bring shoe while OT in [place] expects to be discharged to:: Private residence Living Arrangements: Children Available Help at Discharge: Family Type of Home: House Home Access: Stairs to enter [employer] of Steps: 4 Entrance Stairs-Rails: None Home Layout: Two level;Bed/bath upstairs;Laundry or work area in basement   Alternate Level Stairs-Rails: Left Bathroom Shower/Tub: Risk analyst characteristics: [employer]: Environmental consultant - 2 wheels;Tub bench;Bedside commode;Wheelchair - Education administrator (comment);Hospital bed  OT Diagnosis: Generalized weakness   OT Problem List: Decreased strength;Decreased activity tolerance;Cardiopulmonary status limiting activity   OT Treatment/Interventions: Self-care/ADL training;DME and/or AE instruction;Patient/family education    OT Goals(Current goals can be found in the care plan section) Acute Rehab OT Goals Patient Stated Goal: get home back OT Goal Formulation: With patient Time For Goal Achievement: 08/11/14 Potential to Achieve Goals: Good ADL Goals Pt Will Perform Grooming: with modified independence;standing Pt Will Perform Lower Body Dressing: with supervision;sit to/from stand;with adaptive equipment Pt Will Transfer to Toilet: with  supervision;ambulating;bedside commode Pt Will Perform Toileting - Clothing Manipulation and hygiene: sit to/from stand;with min assist  OT Frequency: Min 2X/week              End of Session Nurse Communication: Mobility status  Activity Tolerance: Patient limited by fatigue Patient left: in bed;with family/visitor present;with call bell/phone within reach;with bed alarm set   Time: [id number] OT Time Calculation (min): 23 min Charges:  OT General Charges $OT Visit: 1 Procedure OT Evaluation $Initial OT Evaluation Tier I: 1 Procedure OT Treatments $Self Care/Home Management : 8-22 mins G-Codes:    Payton Mccallum D Aug 11, 2014, 1:44 PM

## 2014-08-09 NOTE — Progress Notes (Signed)
[facility] PROGRESS NOTE   [name] [id number] DOB: [birth year] DOA: 08/06/2014 PCP: Hayden Rasmussen., MD  HPI/Subjective: Although she is feeling better, less SOB and lower extremity edema, her charted weight is trending up. Creatinine is improving with diuresis. According to the chart -3 L since admission  Assessment/Plan: Principal Problem:   Acute on chronic diastolic CHF (congestive heart failure), NYHA class 3 Active Problems:   Anemia of chronic disease   Type II or unspecified type diabetes mellitus with renal manifestations, uncontrolled   Obesity, Class III, BMI 40-49.9 (morbid obesity)   Chronic kidney disease (CKD), stage IV (severe)   Acute pulmonary edema   S/P BKA (below knee amputation) unilateral   Chronic respiratory failure with hypoxia    Acute on chronic diastolic CHF  -Presented with exertional dyspnea and orthopnea as well as lower extremity edema.  -Elevated proBNP and CXR showing edema and vascular congestion.  -Started on IV Lasix, restrict fluids to 1.2 L per day, daily weights and intake/output.  -Continue hydralazine, Norvasc and Coreg.  -2-D echo on 06/23/14 showed LVEF of 55-60% in grade 2 diastolic dysfunction, I will not repeat it.  -Able to keep -3 L of fluids off of her since admission. Urine output more than 2 L per day.  CKD stage IV  -CKD secondary to diabetes and hypertension. Note that patient is not on ACEI or ARB at home.  -It will likely complicates the diuresis, Lasix increased to 40 mg IV twice a day.  -I appreciate Dr.Coldanato's help. -Presented with creatinine of 2.8, creatinine increased to 3.3 today after diuresis started. -Creatinine improved to 2.8, likely because of better preload and blood pressure after the diuresis  Acute pulmonary edema  -Mild pulmonary edema likely secondary to diastolic CHF, probably CKD stage IV is also contributing.   DM type II, uncontrolled  -A1c was 9.0 on 06/23/2014 which  correlate with mean plasma glucose of 212.  -Patient is on Lantus, continue home dose, insulin sliding scale.  -Lantus increased to 40 units at night, fasting blood glucose is 123.  Chronic respiratory failure with hypoxia  -Patient is on 2 L of oxygen at home since discharge from the hospital on 06/27/14.  -Continue oxygen, I will try to wean o oxygen off.  Hyperkalemia -Secondary to CKD, Kayexalate given.   Code Status: Full code Family Communication: Plan discussed with the patient. Disposition Plan: Remains inpatient   Consultants:  Nephrology  Procedures:  None  Antibiotics:  None   Objective: Filed Vitals:   08/09/14 1346  BP: 120/46  Pulse: 70  Temp: 98.3 F (36.8 C)  Resp: 16    Intake/Output Summary (Last 24 hours) at 08/09/14 1500 Last data filed at 08/09/14 0900  Gross per 24 hour  Intake    790 ml  Output   1750 ml  Net   -960 ml   Filed Weights   08/07/14 0531 08/08/14 0356 08/09/14 0417  Weight: 123.6 kg (272 lb 7.8 oz) 123.1 kg (271 lb 6.2 oz) 124.3 kg (274 lb 0.5 oz)    Exam: General: Alert and awake, oriented x3, not in any acute distress. HEENT: anicteric sclera, pupils reactive to light and accommodation, EOMI CVS: S1-S2 clear, no murmur rubs or gallops Chest: clear to auscultation bilaterally, no wheezing, rales or rhonchi Abdomen: soft nontender, nondistended, normal bowel sounds, no organomegaly Extremities: no cyanosis, clubbing or edema noted bilaterally Neuro: Cranial nerves II-XII intact, no focal neurological deficits  Data Reviewed: Basic Metabolic Panel:  Recent Labs  Lab 08/06/14 0503 08/07/14 0459 08/08/14 0438 08/09/14 0413  NA 143 141 143 145  K 5.5* 6.2* 4.6 4.8  CL 110 105 106 107  CO2 22 24 27 27   GLUCOSE 205* 267* 166* 164*  BUN 79* 83* 83* 78*  CREATININE 2.80* 3.37* 3.09* 2.88*  CALCIUM 9.2 9.0 9.2 9.0  PHOS  --  5.2* 4.9* 4.6   Liver Function Tests:  Recent Labs Lab 08/07/14 0459 08/08/14 0438  08/09/14 0413  ALBUMIN 2.9* 2.9* 2.8*   No results found for this basename: LIPASE, AMYLASE,  in the last 168 hours No results found for this basename: AMMONIA,  in the last 168 hours CBC:  Recent Labs Lab 08/06/14 0503 08/07/14 0459 08/08/14 0438  WBC 5.9 5.9 5.1  HGB 8.8* 8.2* 8.1*  HCT 27.7* 26.5* 26.1*  MCV 89.4 90.1 89.7  PLT 152 149* 165   Cardiac Enzymes: No results found for this basename: CKTOTAL, CKMB, CKMBINDEX, TROPONINI,  in the last 168 hours BNP (last 3 results)  Recent Labs  06/23/14 0245 06/23/14 0926 08/06/14 0503  PROBNP 1574.0* 1689.0* 1374.0*   CBG:  Recent Labs Lab 08/08/14 1146 08/08/14 1742 08/08/14 2137 08/09/14 0745 08/09/14 1128  GLUCAP 174* 225* 242* 123* 133*    Micro No results found for this or any previous visit (from the past 240 hour(s)).   Studies: No results found.  Scheduled Meds: . amLODipine  5 mg Oral Daily  . aspirin  325 mg Oral q morning - 10a  . atorvastatin  40 mg Oral q1800  . carvedilol  25 mg Oral BID WC  . ferrous sulfate  325 mg Oral Q breakfast  . furosemide  40 mg Intravenous BID  . gabapentin  100 mg Oral TID  . heparin  5,000 Units Subcutaneous 3 times per day  . hydrALAZINE  50 mg Oral 3 times per day  . insulin aspart  0-9 Units Subcutaneous TID WC  . insulin aspart  5 Units Subcutaneous TID WC  . insulin glargine  40 Units Subcutaneous QHS  . sodium chloride  3 mL Intravenous Q12H   Continuous Infusions:      Time spent: 35 minutes    [facility]  [facility] Pager [phone] If 7PM-7AM, please contact night-coverage at [email or web address], password [facility] 08/09/2014, 3:00 PM  LOS: 3 days

## 2014-08-10 DIAGNOSIS — I5031 Acute diastolic (congestive) heart failure: Secondary | ICD-10-CM

## 2014-08-10 DIAGNOSIS — E1169 Type 2 diabetes mellitus with other specified complication: Secondary | ICD-10-CM

## 2014-08-10 LAB — RENAL FUNCTION PANEL
ALBUMIN: 2.8 g/dL — AB (ref 3.5–5.2)
Anion gap: 10 (ref 5–15)
BUN: 81 mg/dL — ABNORMAL HIGH (ref 6–23)
CALCIUM: 9.1 mg/dL (ref 8.4–10.5)
CO2: 27 meq/L (ref 19–32)
CREATININE: 3.1 mg/dL — AB (ref 0.50–1.10)
Chloride: 109 mEq/L (ref 96–112)
GFR calc non Af Amer: 15 mL/min — ABNORMAL LOW (ref >90)
GFR, EST AFRICAN AMERICAN: 17 mL/min — AB (ref >90)
Glucose, Bld: 180 mg/dL — ABNORMAL HIGH (ref 70–99)
PHOSPHORUS: 4.7 mg/dL — AB (ref 2.3–4.6)
Potassium: 4.9 mEq/L (ref 3.7–5.3)
SODIUM: 146 meq/L (ref 137–147)

## 2014-08-10 LAB — GLUCOSE, CAPILLARY
Glucose-Capillary: 177 mg/dL — ABNORMAL HIGH (ref 70–99)
Glucose-Capillary: 200 mg/dL — ABNORMAL HIGH (ref 70–99)

## 2014-08-10 MED ORDER — FUROSEMIDE 40 MG PO TABS: 40.0000 mg | ORAL_TABLET | Freq: Two times a day (BID) | ORAL | Status: DC

## 2014-08-10 MED ORDER — INSULIN GLARGINE 100 UNIT/ML ~~LOC~~ SOLN: 40.0000 [IU] | Freq: Every day | SUBCUTANEOUS | Status: DC

## 2014-08-10 NOTE — Discharge Summary (Signed)
Physician Discharge Summary  [name] [id number] DOB: 1951-03-30 DOA: 08/06/2014  PCP: Hayden Rasmussen., MD  Admit date: 08/06/2014 Discharge date: 08/10/2014  Time spent: 40 minutes  Recommendations for Outpatient Follow-up:  1. Followup with primary care physician within one week. 2. Followup with [facility] on 08/19/14  Discharge Diagnoses:  Principal Problem:   Acute on chronic diastolic CHF (congestive heart failure), NYHA class 3 Active Problems:   Anemia of chronic disease   Type II or unspecified type diabetes mellitus with renal manifestations, uncontrolled   Obesity, Class III, BMI 40-49.9 (morbid obesity)   Chronic kidney disease (CKD), stage IV (severe)   Acute pulmonary edema   S/P BKA (below knee amputation) unilateral   Chronic respiratory failure with hypoxia   Discharge Condition: Stable  Diet recommendation:  Low sodium heart healthy diet and carbohydrate modified diet  Filed Weights   08/08/14 0356 08/09/14 0417 08/10/14 0500  Weight: 123.1 kg (271 lb 6.2 oz) 124.3 kg (274 lb 0.5 oz) 123.1 kg (271 lb 6.2 oz)    History of present illness:  [name] is a 63 y.o. female with CKD stage IV, chronic diastolic CHF, chronic respiratory failure on 2 L of oxygen at home and diabetes mellitus type 2, came to the hospital complaining about SOB. Patient discharged from the hospital about 5 weeks ago on 8/9 after she was admitted for chronic diastolic CHF and discharged on 20 mg of IV Lasix and oxygen. Patient did well after discharge for some time but she continues to have shortness of breath especially with exertion, for the past 2 days the SOB got worse and she started to develop orthopnea. Patient came in to the hospital for further evaluation.  In the ED CXR showed vascular congestion and mild pulmonary edema, creatinine of 2.8, BNP of 1374 and she has +2 edema in the RLE patient admitted to the hospital for further evaluation.  Hospital  Course:   Acute on chronic diastolic CHF  -Presented with exertional dyspnea and orthopnea as well as lower extremity edema.  -Elevated proBNP and CXR showing edema and vascular congestion.  -Started on IV Lasix, restrict fluids to 1.2 L per day, daily weights and intake/output.  -Hydralazine, Norvasc and Coreg continued. Consider evaluation as outpatient for Bidil. -2-D echo on 06/23/14 showed LVEF of 55-60% in grade 2 diastolic dysfunction, I will not repeat it.  -Successful removal of about 4.8 L of fluids to the time of discharge. -Nephrology recommended 40 mg of oral Lasix twice a day.  CKD stage IV  -CKD secondary to diabetes and hypertension. Note that patient is not on ACEI or ARB at home.  -It will likely complicates the diuresis, Lasix increased to 40 mg IV twice a day.  -Presented with creatinine of 2.8, the day of discharge creatinine is 2.1. Followup with nephrology as outpatient. -Discharged on Lasix 40 mg twice a day.   Acute pulmonary edema  -Mild pulmonary edema likely secondary to diastolic CHF, probably CKD stage IV is also contributing.   DM type II, uncontrolled  -A1c was 9.0 on 06/23/2014 which correlate with mean plasma glucose of 212.  -Patient is on Lantus, continue home dose, insulin sliding scale.  -Lantus increased to 40 units at night, instructed to take her glucose log to her PCP for further adjustment as outpatient.  Chronic respiratory failure with hypoxia  -Patient is on 2 L of oxygen at home since discharge from the hospital on 06/27/14.  -This is likely secondary to chronic  diastolic CHF. -Oxygen continued, try to wean off of oxygen, could not wean below 2 L or patient will have hypoxia.  Hyperkalemia  -Secondary to CKD, resolved with one dose of Kayexalate.   Procedures:  None  Consultations:  [facility]  Discharge Exam: Filed Vitals:   08/10/14 1340  BP: 138/53  Pulse: 68  Temp: 99.1 F (37.3 C)  Resp: 20   General:  Alert and awake, oriented x3, not in any acute distress. HEENT: anicteric sclera, pupils reactive to light and accommodation, EOMI CVS: S1-S2 clear, no murmur rubs or gallops Chest: clear to auscultation bilaterally, no wheezing, rales or rhonchi Abdomen: soft nontender, nondistended, normal bowel sounds, no organomegaly Extremities: no cyanosis, clubbing or edema noted bilaterally Neuro: Cranial nerves II-XII intact, no focal neurological deficits  Discharge Instructions You were cared for by a hospitalist during your hospital stay. If you have any questions about your discharge medications or the care you received while you were in the hospital after you are discharged, you can call the unit and asked to speak with the hospitalist on call if the hospitalist that took care of you is not available. Once you are discharged, your primary care physician will handle any further medical issues. Please note that NO REFILLS for any discharge medications will be authorized once you are discharged, as it is imperative that you return to your primary care physician (or establish a relationship with a primary care physician if you do not have one) for your aftercare needs so that they can reassess your need for medications and monitor your lab values.  Discharge Instructions   Diet - low sodium heart healthy    Complete by:  As directed      Increase activity slowly    Complete by:  As directed           Current Discharge Medication List    CONTINUE these medications which have CHANGED   Details  furosemide (LASIX) 40 MG tablet Take 1 tablet (40 mg total) by mouth 2 (two) times daily. Qty: 60 tablet, Refills: 1    insulin glargine (LANTUS) 100 UNIT/ML injection Inject 0.4 mLs (40 Units total) into the skin at bedtime. Qty: 10 mL, Refills: 11      CONTINUE these medications which have NOT CHANGED   Details  amLODipine (NORVASC) 5 MG tablet Take 5 mg by mouth daily.    aspirin 325 MG tablet Take  325 mg by mouth every morning.     atorvastatin (LIPITOR) 40 MG tablet Take 1 tablet (40 mg total) by mouth daily at 6 PM. Qty: 30 tablet, Refills: 1    carvedilol (COREG) 25 MG tablet Take 1 tablet (25 mg total) by mouth 2 (two) times daily with a meal. Qty: 60 tablet, Refills: 1    ferrous sulfate 325 (65 FE) MG tablet Take 325 mg by mouth daily with breakfast.    gabapentin (NEURONTIN) 100 MG capsule Take 100 mg by mouth 3 (three) times daily.    hydrALAZINE (APRESOLINE) 50 MG tablet Take 1 tablet (50 mg total) by mouth every 8 (eight) hours. Qty: 90 tablet, Refills: 1    insulin aspart (NOVOLOG) 100 UNIT/ML injection Inject 0-15 Units into the skin 3 (three) times daily with meals. Sliding scale insulin CBG 70 - 120: 0 units: CBG 121 - 150: 2 units; CBG 151 - 200: 3 units; CBG 201 - 250: 5 units; CBG 251 - 300: 8 units;CBG 301 - 350: 11 units; CBG 351 -  400: 15 units; CBG > 400 : 15 units and notify your doctor Qty: 1 vial, Refills: 12    nystatin-triamcinolone ointment (MYCOLOG) Apply 1 application topically 2 (two) times daily.    Omega-3 Fatty Acids (FISH OIL) 1000 MG CAPS Take 1 capsule by mouth 2 (two) times daily.    pregabalin (LYRICA) 75 MG capsule Take 75 mg by mouth 2 (two) times daily.       No Known Allergies Follow-up Information   Follow up with Louis Meckel, MD On 08/19/2014. (Kidney follow up appt is for Thursday Oct 1st at 10:30 am at [facility])    Specialty:  Nephrology   Contact information:   [place] [id number] [phone]        The results of significant diagnostics from this hospitalization (including imaging, microbiology, ancillary and laboratory) are listed below for reference.    Significant Diagnostic Studies: Dg Chest Port 1 View  08/06/2014   CLINICAL DATA:  Worsening shortness of breath.  EXAM: PORTABLE CHEST - 1 VIEW  COMPARISON:  Chest radiograph performed 06/23/2014  FINDINGS: The lungs are  well-aerated. Vascular congestion is noted, with increased interstitial markings, raising concern for pulmonary edema. Small bilateral pleural effusions are suspected. There is no evidence of pneumothorax.  The cardiomediastinal silhouette is borderline normal in size. No acute osseous abnormalities are seen.  IMPRESSION: Vascular congestion, with increased interstitial markings, raising concern for pulmonary edema. Small bilateral pleural effusions suspected.   Electronically Signed   By: Garald Balding M.D.   On: 08/06/2014 05:09    Microbiology: No results found for this or any previous visit (from the past 240 hour(s)).   Labs: Basic Metabolic Panel:  Recent Labs Lab 08/06/14 0503 08/07/14 0459 08/08/14 0438 08/09/14 0413 08/10/14 0431  NA 143 141 143 145 146  K 5.5* 6.2* 4.6 4.8 4.9  CL 110 105 106 107 109  CO2 22 24 27 27 27   GLUCOSE 205* 267* 166* 164* 180*  BUN 79* 83* 83* 78* 81*  CREATININE 2.80* 3.37* 3.09* 2.88* 3.10*  CALCIUM 9.2 9.0 9.2 9.0 9.1  PHOS  --  5.2* 4.9* 4.6 4.7*   Liver Function Tests:  Recent Labs Lab 08/07/14 0459 08/08/14 0438 08/09/14 0413 08/10/14 0431  ALBUMIN 2.9* 2.9* 2.8* 2.8*   No results found for this basename: LIPASE, AMYLASE,  in the last 168 hours No results found for this basename: AMMONIA,  in the last 168 hours CBC:  Recent Labs Lab 08/06/14 0503 08/07/14 0459 08/08/14 0438  WBC 5.9 5.9 5.1  HGB 8.8* 8.2* 8.1*  HCT 27.7* 26.5* 26.1*  MCV 89.4 90.1 89.7  PLT 152 149* 165   Cardiac Enzymes: No results found for this basename: CKTOTAL, CKMB, CKMBINDEX, TROPONINI,  in the last 168 hours BNP: BNP (last 3 results)  Recent Labs  06/23/14 0245 06/23/14 0926 08/06/14 0503  PROBNP 1574.0* 1689.0* 1374.0*   CBG:  Recent Labs Lab 08/09/14 1128 08/09/14 1721 08/09/14 2101 08/10/14 0724 08/10/14 1139  GLUCAP 133* 208* 302* 177* 200*       Signed:  Tristin Gladman A  [facility] 08/10/2014, 1:46  PM

## 2014-08-10 NOTE — Progress Notes (Signed)
Unable to wean to Rm air 02 sats 86-87% on Rm air plaed back on 2 LPM 02 sats now 94-95%.

## 2014-08-10 NOTE — Progress Notes (Signed)
Weaning 02 as ordered by MD. Tedra Coupe from 3 LPM to 2 LPM sats 95%. Weaned down to 1 LPM will recheck and document

## 2014-08-10 NOTE — Progress Notes (Signed)
Pt was active with [facility] and will continue at discharge with [facility].

## 2014-08-11 ENCOUNTER — Telehealth: Payer: Self-pay

## 2014-08-11 NOTE — Telephone Encounter (Signed)
[facility]itional [facility] Clinic Post-discharge Follow-Up Phone Call:  Date of Discharge: 08/10/14  Principal Discharge Diagnosis: Acute on chronic diastolic congestive heart failure Reason for Chronic Case Management: Acute on chronic diastolic congestive heart failure, Type II diabetes mellitus, chronic kidney disease-stage IV, chronic respiratory failure with hypoxia. Patient has had 3 hospital admissions in the last year.  Post-discharge Communication: Placed call to patient [phone] and to cell [phone]. Was able to reach patient at [phone] Interpreter Needed: No   Please check all that apply: X  Patient is knowledgeable of his/her condition(s) and/or treatment. ? Family and/or caregiver is knowledgeable of patient's condition(s) and/or treatment. X  Patient is caring for self at home. X Patient is receiving home health services with [place].    Medication Reconciliation:  X  Medication list reviewed with patient. X  Patient able to obtain needed medications.    Activities of Daily Living:  ? Independent X  Needs assist with all daily activities. Patient uses a walker when upstairs in home and a wheelchair at all other times.  ? Total Care    Community resources in place for patient:  ? None  X  Home Health with [facility] ? Assisted Living ? Hospice ? Support Group          Topics discussed: Patient may need transportation to appointment on 08/16/14 at 1045. Patient indicates she will speak with her daughter and requests follow-up call on Friday 08/13/14.

## 2014-08-13 ENCOUNTER — Telehealth: Payer: Self-pay

## 2014-08-13 NOTE — Telephone Encounter (Signed)
Transitional Care Clinic:  Placed call to patient to remind her of appointment on 08/16/14 at 1045. Patient aware of time and location of Transitional Care Clinic appointment.  Inquired if transportation needed to appointment. Patient indicates her daughter usually takes her to appointments, but she is unable to provide transport to appointment.  Transportation arrangements needed-registrar notified.  Registrar notified that patient needs assist with transfers, mobility, and is on oxygen. Non-emergent EMS transport to be arranged. No other needs identified.

## 2014-08-16 ENCOUNTER — Inpatient Hospital Stay: Payer: Medicare Other

## 2014-08-18 ENCOUNTER — Inpatient Hospital Stay: Payer: Medicare Other

## 2014-08-20 ENCOUNTER — Ambulatory Visit: Payer: Medicare Other | Attending: Internal Medicine | Admitting: Internal Medicine

## 2014-08-20 VITALS — BP 128/68 | HR 63 | Temp 97.8°F | Resp 18 | Ht 62.0 in | Wt 270.2 lb

## 2014-08-20 DIAGNOSIS — E785 Hyperlipidemia, unspecified: Secondary | ICD-10-CM | POA: Diagnosis not present

## 2014-08-20 DIAGNOSIS — I69351 Hemiplegia and hemiparesis following cerebral infarction affecting right dominant side: Secondary | ICD-10-CM | POA: Insufficient documentation

## 2014-08-20 DIAGNOSIS — Z9861 Coronary angioplasty status: Secondary | ICD-10-CM | POA: Diagnosis not present

## 2014-08-20 DIAGNOSIS — I129 Hypertensive chronic kidney disease with stage 1 through stage 4 chronic kidney disease, or unspecified chronic kidney disease: Secondary | ICD-10-CM | POA: Diagnosis not present

## 2014-08-20 DIAGNOSIS — I10 Essential (primary) hypertension: Secondary | ICD-10-CM

## 2014-08-20 DIAGNOSIS — Z90710 Acquired absence of both cervix and uterus: Secondary | ICD-10-CM | POA: Diagnosis not present

## 2014-08-20 DIAGNOSIS — E1129 Type 2 diabetes mellitus with other diabetic kidney complication: Secondary | ICD-10-CM | POA: Insufficient documentation

## 2014-08-20 DIAGNOSIS — D631 Anemia in chronic kidney disease: Secondary | ICD-10-CM | POA: Insufficient documentation

## 2014-08-20 DIAGNOSIS — Z89512 Acquired absence of left leg below knee: Secondary | ICD-10-CM | POA: Insufficient documentation

## 2014-08-20 DIAGNOSIS — Z794 Long term (current) use of insulin: Secondary | ICD-10-CM | POA: Diagnosis not present

## 2014-08-20 DIAGNOSIS — I739 Peripheral vascular disease, unspecified: Secondary | ICD-10-CM | POA: Diagnosis not present

## 2014-08-20 DIAGNOSIS — Z6841 Body Mass Index (BMI) 40.0 and over, adult: Secondary | ICD-10-CM | POA: Insufficient documentation

## 2014-08-20 DIAGNOSIS — E11621 Type 2 diabetes mellitus with foot ulcer: Secondary | ICD-10-CM | POA: Diagnosis not present

## 2014-08-20 DIAGNOSIS — Z7982 Long term (current) use of aspirin: Secondary | ICD-10-CM | POA: Insufficient documentation

## 2014-08-20 DIAGNOSIS — I5031 Acute diastolic (congestive) heart failure: Secondary | ICD-10-CM | POA: Diagnosis not present

## 2014-08-20 DIAGNOSIS — E1140 Type 2 diabetes mellitus with diabetic neuropathy, unspecified: Secondary | ICD-10-CM | POA: Diagnosis not present

## 2014-08-20 DIAGNOSIS — I5033 Acute on chronic diastolic (congestive) heart failure: Secondary | ICD-10-CM | POA: Diagnosis present

## 2014-08-20 DIAGNOSIS — I252 Old myocardial infarction: Secondary | ICD-10-CM | POA: Insufficient documentation

## 2014-08-20 DIAGNOSIS — J9610 Chronic respiratory failure, unspecified whether with hypoxia or hypercapnia: Secondary | ICD-10-CM | POA: Insufficient documentation

## 2014-08-20 DIAGNOSIS — Z9981 Dependence on supplemental oxygen: Secondary | ICD-10-CM | POA: Diagnosis not present

## 2014-08-20 DIAGNOSIS — I2510 Atherosclerotic heart disease of native coronary artery without angina pectoris: Secondary | ICD-10-CM | POA: Insufficient documentation

## 2014-08-20 DIAGNOSIS — L97529 Non-pressure chronic ulcer of other part of left foot with unspecified severity: Secondary | ICD-10-CM | POA: Diagnosis not present

## 2014-08-20 DIAGNOSIS — E1122 Type 2 diabetes mellitus with diabetic chronic kidney disease: Secondary | ICD-10-CM

## 2014-08-20 DIAGNOSIS — E1165 Type 2 diabetes mellitus with hyperglycemia: Secondary | ICD-10-CM

## 2014-08-20 DIAGNOSIS — N184 Chronic kidney disease, stage 4 (severe): Secondary | ICD-10-CM | POA: Diagnosis not present

## 2014-08-20 DIAGNOSIS — N189 Chronic kidney disease, unspecified: Secondary | ICD-10-CM

## 2014-08-20 DIAGNOSIS — I5032 Chronic diastolic (congestive) heart failure: Secondary | ICD-10-CM

## 2014-08-20 LAB — POCT CBG (FASTING - GLUCOSE)-MANUAL ENTRY: GLUCOSE FASTING, POC: 204 mg/dL — AB (ref 70–99)

## 2014-08-20 NOTE — Progress Notes (Signed)
Patient presents with daughter for TCC visit Presents in wheelchair with O2 at 2 L via [place] for [facility]

## 2014-08-20 NOTE — Patient Instructions (Signed)

## 2014-08-20 NOTE — Progress Notes (Signed)
Patient ID: [name], female   DOB: Mar 10, 1951, 63 y.o.   MRN: [id number]                                     [facility] Discharge Acute Issues Care Follow Up                                                                        Patient Demographics  [name], is a 63 y.o. female  DOB January 12, 1951  MRN [id number].  Primary MD  Hayden Rasmussen., MD   Reason for [facility] follow Up - recurrent hospitalizations for acute diastolic heart failure   Past Medical History  Diagnosis Date  . ST elevation myocardial infarction (STEMI) of inferior wall  03/2009    Ostial/proximal RCA occlusion treated with BMS stent  . CAD (coronary artery disease), native coronary artery      status post PCI to the RCA for inferior STEMI  . Cancer     endo ca  . Hypertension associated with diabetes   . Hyperlipidemia LDL goal <70   . Diabetes mellitus, type II, insulin dependent     With complications - CAD, CVA, peripheral ulcer ,  . Stroke/cerebrovascular accident  4/30/ 2014    Right-sided weakness, mostly with balance issues and only mild weakness.  . Diabetic peripheral neuropathy associated with type 2 diabetes mellitus   . Obesity, Class III, BMI 40-49.9 (morbid obesity)     BMI 43; 5' 2',  235 pounds 6.4 ounces  . CKD (chronic kidney disease), stage IV     Recent acute on chronic exacerbation in July 2014  . PAD (peripheral artery disease)   . Diabetic foot ulcer   . Dry gangrene     Left second toe; now on Sole of L foot    Past Surgical History  Procedure Laterality Date  . Abdominal hysterectomy    . Leg tendon surgery      patient fell in a store right leg  . Leg surgery      hole in bone( during Childhood)  . Cardiac catheterization  03/31/2009    Proximal RCA thrombotic occlusion (inferior STEMI) other coronaries but in a codominant system  . Coronary angioplasty  03/31/2009    PTCA , bare-metal stent 3.5 mm x 24 mm ostium of RCA ,EF 55%  .  Amputation Left 08/07/2013    Procedure: left midfoot amputation;  Surgeon: Marianna Payment, MD;  Location: [facility];  Service: Orthopedics;  Laterality: Left;  . Amputation Left 08/09/2013    Procedure: AMPUTATION BELOW KNEE;  Surgeon: Marianna Payment, MD;  Location: [facility];  Service: Orthopedics;  Laterality: Left;    Recent HPI and Hospital Course Patient is a 62 year old female with chronic kidney disease stage IV (being followed by Dr. Moshe Cipro), chronic diastolic heart failure (primary cardiologist Dr. Richardo Priest. [place] Found with parent, chronic respiratory failure on home O2, chronic anemia secondary to kidney disease, CAD status post PTCI, history of peripheral vascular disease status post left BKA, history of CVA, diabetes, hypertension, DM (last A1C 8.2) has had a recurrent hospitalization for acute chronic diastolic  CHF in the setting of worsening chronic kidney disease stage IV. Since discharge she has been doing well, her discharge weight was 271 pounds, weight today is 270 pounds. She presents with her daughter today. Claims  is not short of breath, her lower extremity edema has significantly reduced. She saw Dr. Moshe Cipro on 08/19/14, no adjustments in her diuretics were made.  [facility] Acute Care Issue to be followed in the Clinic 1. Renal function, and hemoglobin will need to be monitored closely  Subjective:   [name] today has, No headache, No chest pain, No abdominal pain - No Nausea, No new weakness tingling or numbness, No Cough - SOB.  Assessment & Plan    Patient Active Problem List   Diagnosis Date Noted  . Pulmonary edema 08/06/2014  . Acute pulmonary edema 08/06/2014  . CKD (chronic kidney disease), stage III 08/06/2014  . S/P BKA (below knee amputation) unilateral 08/06/2014  . Chronic respiratory failure with hypoxia 08/06/2014  . Acute on chronic diastolic CHF (congestive heart failure), NYHA class 4 06/23/2014  . HTN (hypertension) 06/23/2014    . AKI (acute kidney injury) 01/12/2014  . Acute diastolic congestive heart failure, NYHA class 2 01/12/2014  . Acute on chronic diastolic CHF (congestive heart failure), NYHA class 3 01/11/2014  . UTI (lower urinary tract infection) 01/11/2014  . Acute CHF 01/11/2014  . Anemia in chronic kidney disease(285.21) 09/25/2013  . Gangrene 09/21/2013  . Acute posthemorrhagic anemia 09/21/2013  . Foot infection 08/06/2013  . Hyponatremia 08/06/2013  . Chronic kidney disease (CKD), stage IV (severe) 08/06/2013  . Severe obesity (BMI >= 40) 06/22/2013    Class: Chronic  . Peripheral arterial disease 05/01/2013  . CAD (coronary artery disease), native coronary artery   . Obesity, Class III, BMI 40-49.9 (morbid obesity)   . Hyperlipidemia LDL goal <70   . Hypertension associated with diabetes   . Unspecified constipation 04/16/2013  . Type II or unspecified type diabetes mellitus with renal manifestations, uncontrolled 04/16/2013  . Secondary renovascular hypertension, benign 04/09/2013  . Headache(784.0) 03/18/2013  . Nausea and vomiting 03/18/2013  . Acute on chronic renal failure 03/18/2013  . Anemia of chronic disease 03/18/2013  . CVA (cerebral infarction) 03/18/2013  . Non-ketotic hyperosmolar coma 03/18/2013   1. Recurrent admissions for Acute on chronic diastolic heart failure - Compensated currently, weight down to 269 pounds, discharge weight 271 pounds. Continue current Lasix dose of 40 mg twice a day. Patient has been counseled regarding the importance of weighing herself daily, and if she has a rapid weight gain, she knows that she needs to contact the clinic. She has also been advised that if she were to have worsening leg edema, worsening shortness of breath that she call this clinic as well. Although, if she is acutely short of breath than she needs to go to the emergency room. Both Daughter, and patient agreeable with this strategy  2. Chronic kidney disease stage IV - Followed  by Dr. Sheilah Pigeon kidney-last seen on 08/19/14-no adjustment made to her diuretic regimen. Recheck electrolytes prior to her next visit.  3. Type 2 diabetes - Last A1c 8.2 on 08/06/14 - Asked patient to keep a CBG log, and bring to her next visit. For now, continue with 40 units of Lantus daily, along with SSI  4. Hypertension - Currently controlled. Continue with Cardizem 50 mg 3 times a day, Coreg 25 mg twice a day, amlodipine 5 mg daily, and Lasix 40 mg twice daily.  5. Anemia -  Is secondary to chronic kidney disease. Currently on iron supplementation, will recheck CBC prior to next visit, will likely need initiation of erythropoietin at some point   6. History of CVA - continue with aspirin statin  7. History of CAD status post PCI - Stable, without any chest pain or shortness of breath. Continue with aspirin, statin and beta blockers. Has a appointment with cardiology scheduled for/6, she has been encouraged to keep this appointment.  8. History of peripheral vascular disease-status post left BKA - Continue with aggressive risk factor modification. We'll attempt to decrease A1c gradually, recheck lipid panel prior to next visit. Continue with aspirin, statin. Blood pressure seems to be adequately controlled.  9. Dyslipidemia -continue with statin, check lipid panel prior to next visit  10. Morbid obesity - Counseled regarding importance of weight loss  Reason for frequent admissions/ER visits - recurrent acute on chronic diastolic heart failure  Health Maintenance- done at subsequent visits  Next follow up visit at [facility] - 10 days- please followup chemistries, CBC and lipid panel  Any follow ups or referrals- Has followup scheduled with cardiology on 10/6, has followup with Dr. Moshe Cipro in 6 weeks.    Objective:   Filed Vitals:   08/20/14 1026  BP: 128/68  Pulse: 63  Temp: 97.8 F (36.6 C)  TempSrc: Oral  Resp: 18  Height: 5\' 2"  (1.575 m)  Weight: 270 lb  3.2 oz (122.562 kg)  SpO2: 100%    Wt Readings from Last 3 Encounters:  08/20/14 270 lb 3.2 oz (122.562 kg)  08/10/14 271 lb 6.2 oz (123.1 kg)  06/26/14 271 lb 3.2 oz (123.016 kg)      Medication List       This list is accurate as of: 08/20/14 10:46 AM.  Always use your most recent med list.               amLODipine 5 MG tablet  Commonly known as:  NORVASC  Take 5 mg by mouth daily.     aspirin 325 MG tablet  Take 325 mg by mouth every morning.     atorvastatin 40 MG tablet  Commonly known as:  LIPITOR  Take 1 tablet (40 mg total) by mouth daily at 6 PM.     carvedilol 25 MG tablet  Commonly known as:  COREG  Take 1 tablet (25 mg total) by mouth 2 (two) times daily with a meal.     ferrous sulfate 325 (65 FE) MG tablet  Take 325 mg by mouth daily with breakfast.     Fish Oil 1000 MG Caps  Take 1 capsule by mouth 2 (two) times daily.     furosemide 40 MG tablet  Commonly known as:  LASIX  Take 1 tablet (40 mg total) by mouth 2 (two) times daily.     gabapentin 100 MG capsule  Commonly known as:  NEURONTIN  Take 100 mg by mouth 3 (three) times daily.     hydrALAZINE 50 MG tablet  Commonly known as:  APRESOLINE  Take 1 tablet (50 mg total) by mouth every 8 (eight) hours.     insulin aspart 100 UNIT/ML injection  Commonly known as:  novoLOG  Inject 0-15 Units into the skin 3 (three) times daily with meals. Sliding scale insulin CBG 70 - 120: 0 units: CBG 121 - 150: 2 units; CBG 151 - 200: 3 units; CBG 201 - 250: 5 units; CBG 251 - 300: 8 units;CBG 301 - 350: 11 units; CBG  351 - 400: 15 units; CBG > 400 : 15 units and notify your doctor     insulin glargine 100 UNIT/ML injection  Commonly known as:  LANTUS  Inject 0.4 mLs (40 Units total) into the skin at bedtime.     nystatin-triamcinolone ointment  Commonly known as:  MYCOLOG  Apply 1 application topically 2 (two) times daily.     pregabalin 75 MG capsule  Commonly known as:  LYRICA  Take 75 mg by mouth  2 (two) times daily.          Physical Exam  Awake Alert, Oriented X 3, No new F.N deficits, Normal affect Whitney.AT,PERRAL Supple Neck,No JVD, No cervical lymphadenopathy appriciated.  Symmetrical Chest wall movement, Good air movement bilaterally, CTAB RRR,No Gallops,Rubs or new Murmurs, No Parasternal Heave +ve B.Sounds, Abd Soft, No tenderness, No organomegaly appriciated, No rebound - guarding or rigidity. Status post left BKA-prosthesis in place. 2+ pitting edema on the right lower extremity No Cyanosis, Clubbing, No new Rash or bruise   Data Review   Micro Results No results found for this or any previous visit (from the past 240 hour(s)).   CBC No results found for this basename: WBC, HGB, HCT, PLT, MCV, MCH, MCHC, RDW, NEUTRABS, LYMPHSABS, MONOABS, EOSABS, BASOSABS, BANDABS, BANDSABD,  in the last 168 hours  Chemistries  No results found for this basename: NA, K, CL, CO2, GLUCOSE, BUN, CREATININE, GFRCGP, CALCIUM, MG, AST, ALT, ALKPHOS, BILITOT,  in the last 168 hours ------------------------------------------------------------------------------------------------------------------ estimated creatinine clearance is 23.2 ml/min (by C-G formula based on Cr of 3.1). ------------------------------------------------------------------------------------------------------------------ No results found for this basename: HGBA1C,  in the last 72 hours ------------------------------------------------------------------------------------------------------------------ No results found for this basename: CHOL, HDL, LDLCALC, TRIG, CHOLHDL, LDLDIRECT,  in the last 72 hours ------------------------------------------------------------------------------------------------------------------ No results found for this basename: TSH, T4TOTAL, FREET3, T3FREE, THYROIDAB,  in the last 72  hours ------------------------------------------------------------------------------------------------------------------ No results found for this basename: VITAMINB12, FOLATE, FERRITIN, TIBC, IRON, RETICCTPCT,  in the last 72 hours  Coagulation profile No results found for this basename: INR, PROTIME,  in the last 168 hours  No results found for this basename: DDIMER,  in the last 72 hours  Cardiac Enzymes No results found for this basename: CK, CKMB, TROPONINI, MYOGLOBIN,  in the last 168 hours ------------------------------------------------------------------------------------------------------------------ No components found with this basename: POCBNP,    Time Spent in minutes  61   Rhyan Wolters M.D on 08/20/2014 at 10:46 AM   **Disclaimer: This note may have been dictated with voice recognition software. Similar sounding words can inadvertently be transcribed and this note may contain transcription errors which may not have been corrected upon publication of note.**

## 2014-08-24 ENCOUNTER — Ambulatory Visit: Payer: Medicare Other | Admitting: Cardiology

## 2014-08-27 ENCOUNTER — Ambulatory Visit: Payer: Medicare Other | Attending: Internal Medicine

## 2014-08-30 ENCOUNTER — Ambulatory Visit: Payer: Medicare Other

## 2014-08-31 ENCOUNTER — Ambulatory Visit: Payer: Medicare Other | Attending: Internal Medicine

## 2014-08-31 DIAGNOSIS — Z0000 Encounter for general adult medical examination without abnormal findings: Secondary | ICD-10-CM | POA: Insufficient documentation

## 2014-08-31 LAB — CBC WITH DIFFERENTIAL/PLATELET
BASOS ABS: 0 10*3/uL (ref 0.0–0.1)
BASOS PCT: 0 % (ref 0–1)
Eosinophils Absolute: 0.3 10*3/uL (ref 0.0–0.7)
Eosinophils Relative: 5 % (ref 0–5)
HCT: 28.6 % — ABNORMAL LOW (ref 36.0–46.0)
Hemoglobin: 9 g/dL — ABNORMAL LOW (ref 12.0–15.0)
Lymphocytes Relative: 30 % (ref 12–46)
Lymphs Abs: 1.6 10*3/uL (ref 0.7–4.0)
MCH: 27.1 pg (ref 26.0–34.0)
MCHC: 31.5 g/dL (ref 30.0–36.0)
MCV: 86.1 fL (ref 78.0–100.0)
MONO ABS: 0.4 10*3/uL (ref 0.1–1.0)
Monocytes Relative: 8 % (ref 3–12)
NEUTROS ABS: 3 10*3/uL (ref 1.7–7.7)
Neutrophils Relative %: 57 % (ref 43–77)
PLATELETS: 171 10*3/uL (ref 150–400)
RBC: 3.32 MIL/uL — ABNORMAL LOW (ref 3.87–5.11)
RDW: 16.2 % — AB (ref 11.5–15.5)
WBC: 5.3 10*3/uL (ref 4.0–10.5)

## 2014-08-31 NOTE — Progress Notes (Unsigned)
Patient here for follow up from [facility] for fasting blood work

## 2014-09-01 ENCOUNTER — Ambulatory Visit: Payer: Medicare Other | Attending: Internal Medicine | Admitting: Internal Medicine

## 2014-09-01 VITALS — BP 126/72 | HR 64 | Temp 97.6°F | Resp 16 | Ht 62.0 in | Wt 268.4 lb

## 2014-09-01 DIAGNOSIS — E1165 Type 2 diabetes mellitus with hyperglycemia: Secondary | ICD-10-CM

## 2014-09-01 DIAGNOSIS — E11621 Type 2 diabetes mellitus with foot ulcer: Secondary | ICD-10-CM | POA: Diagnosis not present

## 2014-09-01 DIAGNOSIS — Z794 Long term (current) use of insulin: Secondary | ICD-10-CM | POA: Diagnosis not present

## 2014-09-01 DIAGNOSIS — D631 Anemia in chronic kidney disease: Secondary | ICD-10-CM | POA: Diagnosis not present

## 2014-09-01 DIAGNOSIS — I2510 Atherosclerotic heart disease of native coronary artery without angina pectoris: Secondary | ICD-10-CM | POA: Insufficient documentation

## 2014-09-01 DIAGNOSIS — I5032 Chronic diastolic (congestive) heart failure: Secondary | ICD-10-CM

## 2014-09-01 DIAGNOSIS — I96 Gangrene, not elsewhere classified: Secondary | ICD-10-CM | POA: Diagnosis not present

## 2014-09-01 DIAGNOSIS — I69851 Hemiplegia and hemiparesis following other cerebrovascular disease affecting right dominant side: Secondary | ICD-10-CM | POA: Diagnosis not present

## 2014-09-01 DIAGNOSIS — J9610 Chronic respiratory failure, unspecified whether with hypoxia or hypercapnia: Secondary | ICD-10-CM | POA: Diagnosis not present

## 2014-09-01 DIAGNOSIS — E1121 Type 2 diabetes mellitus with diabetic nephropathy: Secondary | ICD-10-CM | POA: Insufficient documentation

## 2014-09-01 DIAGNOSIS — I739 Peripheral vascular disease, unspecified: Secondary | ICD-10-CM | POA: Diagnosis not present

## 2014-09-01 DIAGNOSIS — Z9981 Dependence on supplemental oxygen: Secondary | ICD-10-CM | POA: Insufficient documentation

## 2014-09-01 DIAGNOSIS — Z955 Presence of coronary angioplasty implant and graft: Secondary | ICD-10-CM | POA: Insufficient documentation

## 2014-09-01 DIAGNOSIS — I252 Old myocardial infarction: Secondary | ICD-10-CM | POA: Diagnosis not present

## 2014-09-01 DIAGNOSIS — Z89512 Acquired absence of left leg below knee: Secondary | ICD-10-CM | POA: Diagnosis not present

## 2014-09-01 DIAGNOSIS — I5033 Acute on chronic diastolic (congestive) heart failure: Secondary | ICD-10-CM | POA: Insufficient documentation

## 2014-09-01 DIAGNOSIS — N184 Chronic kidney disease, stage 4 (severe): Secondary | ICD-10-CM | POA: Insufficient documentation

## 2014-09-01 DIAGNOSIS — Z6841 Body Mass Index (BMI) 40.0 and over, adult: Secondary | ICD-10-CM | POA: Insufficient documentation

## 2014-09-01 DIAGNOSIS — E785 Hyperlipidemia, unspecified: Secondary | ICD-10-CM | POA: Diagnosis not present

## 2014-09-01 DIAGNOSIS — I129 Hypertensive chronic kidney disease with stage 1 through stage 4 chronic kidney disease, or unspecified chronic kidney disease: Secondary | ICD-10-CM | POA: Diagnosis not present

## 2014-09-01 DIAGNOSIS — Z90710 Acquired absence of both cervix and uterus: Secondary | ICD-10-CM | POA: Insufficient documentation

## 2014-09-01 LAB — LIPID PANEL W/DIRECT LDL/HDL RATIO
Cholesterol: 175 mg/dL (ref 0–200)
Direct LDL: 115 mg/dL — ABNORMAL HIGH
HDL: 31 mg/dL — ABNORMAL LOW (ref >39)
LDL/HDL RATIO (DIRECT LDL): 3.7 ratio
TRIGLYCERIDES: 132 mg/dL (ref <150)
Total Chol/HDL Ratio: 5.6 Ratio

## 2014-09-01 LAB — BASIC METABOLIC PANEL
BUN: 61 mg/dL — ABNORMAL HIGH (ref 6–23)
CHLORIDE: 110 meq/L (ref 96–112)
CO2: 23 meq/L (ref 19–32)
CREATININE: 2.7 mg/dL — AB (ref 0.50–1.10)
Calcium: 9.2 mg/dL (ref 8.4–10.5)
Glucose, Bld: 280 mg/dL — ABNORMAL HIGH (ref 70–99)
Potassium: 5 mEq/L (ref 3.5–5.3)
Sodium: 144 mEq/L (ref 135–145)

## 2014-09-01 LAB — GLUCOSE, POCT (MANUAL RESULT ENTRY): POC Glucose: 265 mg/dl — AB (ref 70–99)

## 2014-09-01 MED ORDER — INSULIN ASPART 100 UNIT/ML ~~LOC~~ SOLN
6.0000 [IU] | Freq: Once | SUBCUTANEOUS | Status: AC
  Administered 2014-09-01: 6 [IU] via SUBCUTANEOUS

## 2014-09-01 MED ORDER — ATORVASTATIN CALCIUM 80 MG PO TABS: 80.0000 mg | ORAL_TABLET | Freq: Every day | ORAL | Status: DC

## 2014-09-01 NOTE — Progress Notes (Signed)
Patient ID: [name], female   DOB: 02/06/1951, 63 y.o.   MRN: [id number] Patient ID: [name], female   DOB: [birth year], 63 y.o.   MRN: [id number]                                     [facility] Discharge Acute Issues Care Follow Up                                                                        Patient Demographics  [name], is a 63 y.o. female  DOB 03-05-1951  MRN [id number].  Primary MD  Hayden Rasmussen., MD   Reason for [facility] follow Up - recurrent hospitalizations for acute diastolic heart failure   Past Medical History  Diagnosis Date  . ST elevation myocardial infarction (STEMI) of inferior wall  03/2009    Ostial/proximal RCA occlusion treated with BMS stent  . CAD (coronary artery disease), native coronary artery      status post PCI to the RCA for inferior STEMI  . Cancer     endo ca  . Hypertension associated with diabetes   . Hyperlipidemia LDL goal <70   . Diabetes mellitus, type II, insulin dependent     With complications - CAD, CVA, peripheral ulcer ,  . Stroke/cerebrovascular accident  4/30/ 2014    Right-sided weakness, mostly with balance issues and only mild weakness.  . Diabetic peripheral neuropathy associated with type 2 diabetes mellitus   . Obesity, Class III, BMI 40-49.9 (morbid obesity)     BMI 43; 5' 2',  235 pounds 6.4 ounces  . CKD (chronic kidney disease), stage IV     Recent acute on chronic exacerbation in July 2014  . PAD (peripheral artery disease)   . Diabetic foot ulcer   . Dry gangrene     Left second toe; now on Sole of L foot    Past Surgical History  Procedure Laterality Date  . Abdominal hysterectomy    . Leg tendon surgery      patient fell in a store right leg  . Leg surgery      hole in bone( during Childhood)  . Cardiac catheterization  03/31/2009    Proximal RCA thrombotic occlusion (inferior STEMI) other coronaries but in a codominant system  . Coronary angioplasty   03/31/2009    PTCA , bare-metal stent 3.5 mm x 24 mm ostium of RCA ,EF 55%  . Amputation Left 08/07/2013    Procedure: left midfoot amputation;  Surgeon: Marianna Payment, MD;  Location: [facility];  Service: Orthopedics;  Laterality: Left;  . Amputation Left 08/09/2013    Procedure: AMPUTATION BELOW KNEE;  Surgeon: Marianna Payment, MD;  Location: [facility];  Service: Orthopedics;  Laterality: Left;    Recent HPI and Hospital Course  Patient is a 63 year old female with chronic kidney disease stage IV (being followed by Dr. Moshe Cipro), chronic diastolic heart failure (primary cardiologist Dr. Richardo Priest. 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 Since discharge she has been doing well, her discharge weight was 271 pounds, weight today is 270 pounds. She presents with her daughter today. Claims  is not short of breath, her lower extremity edema has significantly reduced. She saw Dr. Moshe Cipro on 08/19/14, no adjustments in her diuretics were made. However  BMP was not checked that time.    [facility] Acute Care Issue to be followed in the Clinic  1. Renal function, and hemoglobin will need to be monitored closely  Subjective:   [name] today has, No headache, No chest pain, No abdominal pain - No Nausea, No new weakness tingling or numbness, No Cough - SOB.  Assessment & Plan    Patient Active Problem List   Diagnosis Date Noted  . Pulmonary edema 08/06/2014  . Acute pulmonary edema 08/06/2014  . CKD (chronic kidney disease), stage III 08/06/2014  . S/P BKA (below knee amputation) unilateral 08/06/2014  . Chronic respiratory failure with hypoxia  08/06/2014  . Acute on chronic diastolic CHF (congestive heart failure), NYHA class 4 06/23/2014  . HTN (hypertension) 06/23/2014  . AKI (acute kidney injury) 01/12/2014  . Acute diastolic congestive heart failure, NYHA class 2 01/12/2014  . Acute on chronic diastolic CHF (congestive heart failure), NYHA class 3 01/11/2014  . UTI (lower urinary tract infection) 01/11/2014  . Acute CHF 01/11/2014  . Anemia in chronic kidney disease(285.21) 09/25/2013  . Gangrene 09/21/2013  . Acute posthemorrhagic anemia 09/21/2013  . Foot infection 08/06/2013  . Hyponatremia 08/06/2013  . Chronic kidney disease (CKD), stage IV (severe) 08/06/2013  . Severe obesity (BMI >= 40) 06/22/2013    Class: Chronic  . Peripheral arterial disease 05/01/2013  . CAD (coronary artery disease), native coronary artery   . Obesity, Class III, BMI 40-49.9 (morbid obesity)   . Hyperlipidemia LDL goal <70   . Hypertension associated with diabetes   . Unspecified constipation 04/16/2013  . Type II or unspecified type diabetes mellitus with renal manifestations, uncontrolled 04/16/2013  . Secondary renovascular hypertension, benign 04/09/2013  . Headache(784.0) 03/18/2013  . Nausea and vomiting 03/18/2013  . Acute on chronic renal failure 03/18/2013  . Anemia of chronic disease 03/18/2013  . CVA (cerebral infarction) 03/18/2013  . Non-ketotic hyperosmolar coma 03/18/2013     1. Recurrent admissions for Acute on chronic diastolic heart failure  - Compensated currently, weight down to 268 pounds, discharge weight 271 pounds. Continue current Lasix dose of 40 mg twice a day. Patient has been counseled regarding the importance of weighing herself daily, and if she has a rapid weight gain, she knows that she needs to contact the clinic. She has also been advised that if she were to have worsening leg edema, worsening shortness of breath that she call this clinic as well.  Also educated her daughter and provided her with  written instructions.  There is trace fluid overload on exam, will check a BMP, his creatinine is stable which is her baseline of around 3, might increase her diuretic to 60 mg of Lasix twice a day. She has an upcoming appointment with her cardiologist Dr. Gwenlyn Found in one week.    2. Chronic kidney disease stage IV - Followed by Dr. Sheilah Pigeon kidney-last seen on 08/19/14-no adjustment made to her diuretic regimen.  Baseline creatinine is 3. Will check BMP today.    3.  Type 2 diabetes - Last A1c 8.2 on 08/06/14 - Asked patient to keep a CBG log, and bring to her next visit. For now, continue with 40 units of Lantus daily, along with SSI -Her CBG here was 260, she received Humalog. She did have a heavy breakfast this morning. Again counseled to do CBGs q. a.c. at bedtime and bring along next visit.    4. Hypertension - Currently controlled. Continue with Cardizem 50 mg 3 times a day, Coreg 25 mg twice a day, amlodipine 5 mg daily, and Lasix 40 mg twice daily.    5. Anemia - Is secondary to chronic kidney disease. Currently on iron supplementation, CBC stable checked last week.    6. History of CVA - continue with aspirin statin for secondary prevention which will be continued.    7. History of CAD status post PCI - Stable, without any chest pain or shortness of breath. Continue with aspirin, statin and beta blockers. Has a appointment with cardiology scheduled in one week with Dr. Gwenlyn Found, she has been encouraged to keep this appointment.    8. History of peripheral vascular disease-status post left BKA - Continue with aggressive risk factor modification. We'll attempt to decrease A1c gradually, recheck lipid panel prior to next visit. Continue with aspirin, statin. Blood pressure seems to be adequately controlled. She has some discomfort on the left BKA stump site, this has been going on for a few weeks, site examined looks completely stable. Probably she has to get her  prosthesis refitted. Requested her to see her orthopedic surgeon Dr.Xu in one week.    9. Dyslipidemia -continue with statin but increase the dose from 40 mg to 80 mg daily  Lab Results  Component Value Date   CHOL 187 03/19/2013   HDL 31* 08/31/2014   LDLCALC 95 03/19/2013   LDLDIRECT 115* 08/31/2014   TRIG 132 08/31/2014   CHOLHDL 5.6 08/31/2014      10. Morbid obesity - Counseled regarding importance of weight loss    Reason for frequent admissions/ER visits - recurrent acute on chronic diastolic heart failure  Health Maintenance- done at subsequent visits  Next follow up visit at [facility] - 14 days- please followup BMP, CBG control  Any follow ups or referrals- Has followup scheduled with cardiology on 10/6, has followup with Dr. Moshe Cipro in 6 weeks.    Objective:   Filed Vitals:   09/01/14 1043  BP: 126/72  Pulse: 64  Temp: 97.6 F (36.4 C)  TempSrc: Oral  Resp: 16  Height: 5\' 2"  (1.575 m)  Weight: 268 lb 6.4 oz (121.745 kg)  SpO2: 97%    Wt Readings from Last 3 Encounters:  09/01/14 268 lb 6.4 oz (121.745 kg)  08/20/14 270 lb 3.2 oz (122.562 kg)  08/10/14 271 lb 6.2 oz (123.1 kg)      Medication List       This list is accurate as of: 09/01/14 11:06 AM.  Always use your most recent med list.               amLODipine 5 MG tablet  Commonly known as:  NORVASC  Take 5 mg by mouth daily.     aspirin 325 MG tablet  Take 325 mg by mouth every morning.     atorvastatin 40 MG tablet  Commonly known as:  LIPITOR  Take 1 tablet (40 mg total) by mouth daily at 6 PM.     carvedilol 25 MG tablet  Commonly known as:  COREG  Take 1 tablet (25 mg total) by mouth 2 (two) times daily with a meal.     ferrous sulfate 325 (65 FE) MG tablet  Take 325 mg by mouth daily with breakfast.     Fish Oil 1000 MG Caps  Take 1 capsule by mouth 2 (two) times daily.     furosemide 40 MG tablet  Commonly known as:  LASIX  Take 1 tablet (40 mg total) by mouth 2  (two) times daily.     gabapentin 100 MG capsule  Commonly known as:  NEURONTIN  Take 100 mg by mouth 3 (three) times daily.     hydrALAZINE 50 MG tablet  Commonly known as:  APRESOLINE  Take 1 tablet (50 mg total) by mouth every 8 (eight) hours.     insulin aspart 100 UNIT/ML injection  Commonly known as:  novoLOG  Inject 0-15 Units into the skin 3 (three) times daily with meals. Sliding scale insulin CBG 70 - 120: 0 units: CBG 121 - 150: 2 units; CBG 151 - 200: 3 units; CBG 201 - 250: 5 units; CBG 251 - 300: 8 units;CBG 301 - 350: 11 units; CBG 351 - 400: 15 units; CBG > 400 : 15 units and notify your doctor     insulin glargine 100 UNIT/ML injection  Commonly known as:  LANTUS  Inject 0.4 mLs (40 Units total) into the skin at bedtime.     nystatin-triamcinolone ointment  Commonly known as:  MYCOLOG  Apply 1 application topically 2 (two) times daily.     pregabalin 75 MG capsule  Commonly known as:  LYRICA  Take 75 mg by mouth 2 (two) times daily.          Physical Exam  Awake Alert, Oriented X 3, No new F.N deficits, Normal affect [place].AT,PERRAL Supple Neck,No JVD, No cervical lymphadenopathy appriciated.  Symmetrical Chest wall movement, Good air movement bilaterally, CTAB RRR,No Gallops,Rubs or new Murmurs, No Parasternal Heave +ve B.Sounds, Abd Soft, No tenderness, No organomegaly appriciated, No rebound - guarding or rigidity. Status post left BKA-prosthesis in place. Stump site stable has been examined. 2+ pitting edema on the right lower extremity No Cyanosis, Clubbing, No new Rash or bruise , trace edema R leg    Data Review   Micro Results No results found for this or any previous visit (from the past 240 hour(s)).   CBC  Recent Labs Lab 08/31/14 0935  WBC 5.3  HGB 9.0*  HCT 28.6*  PLT 171  MCV 86.1  MCH 27.1  MCHC 31.5  RDW 16.2*  LYMPHSABS 1.6  MONOABS 0.4  EOSABS 0.3  BASOSABS 0.0    Chemistries  No results found for this basename: NA,  K, CL, CO2, GLUCOSE, BUN, CREATININE, GFRCGP, CALCIUM, MG, AST, ALT, ALKPHOS, BILITOT,  in the last 168 hours ------------------------------------------------------------------------------------------------------------------ estimated creatinine clearance is 23.1 ml/min (by C-G formula based on Cr of 3.1). ------------------------------------------------------------------------------------------------------------------ No results found for this basename: HGBA1C,  in the last 72 hours ------------------------------------------------------------------------------------------------------------------  Recent Labs  08/31/14 0935  HDL 31*  TRIG 132  CHOLHDL 5.6  LDLDIRECT 115*   ------------------------------------------------------------------------------------------------------------------ No results found for this basename: TSH, T4TOTAL, FREET3, T3FREE, THYROIDAB,  in the last 72 hours ------------------------------------------------------------------------------------------------------------------ No results found for this basename: VITAMINB12, FOLATE, FERRITIN, TIBC, IRON, RETICCTPCT,  in the last 72 hours  Coagulation profile No results found for this basename: INR, PROTIME,  in the last 168 hours  No results found for this basename: DDIMER,  in the last  72 hours  Cardiac Enzymes No results found for this basename: CK, CKMB, TROPONINI, MYOGLOBIN,  in the last 168 hours ------------------------------------------------------------------------------------------------------------------ No components found with this basename: POCBNP,    Time Spent in minutes  45   Lala Lund K M.D on 09/01/2014 at 11:06 AM   **Disclaimer: This note may have been dictated with voice recognition software. Similar sounding words can inadvertently be transcribed and this note may contain transcription errors which may not have been corrected upon publication of note.**

## 2014-09-01 NOTE — Patient Instructions (Addendum)
Follow with Primary MD Hayden Rasmussen., MD and your Heart and Kidney Doctor  in 7 days    Activity: As tolerated with Full fall precautions use walker/cane & assistance as needed   Disposition Home    Diet: Heart Healthy ,  Check your Weight same time everyday, if you gain over 2 pounds, or you develop in leg swelling, experience more shortness of breath or chest pain, call your Primary MD immediately. Follow Cardiac Low Salt Diet and 1.5 lit/day fluid restriction.   Accuchecks 4 times/day, Once in AM empty stomach and then before each meal. Log in all results and show them to your Prim.MD in 3 days. If any glucose reading is under 80 or above 300 call your Prim MD immidiately. Follow Low glucose instructions for glucose under 80 as instructed.

## 2014-09-01 NOTE — Progress Notes (Addendum)
Patient presents with daughter for f/u [facility] C/o pain left leg where meets prosthetic for 2 days; rates 6/10 at present  SPO2 97% on 2L via [place]  CBG 265 Patient states she had 3 units novolog insulin prior to eating oatmeal this AM 6 units novolog insulin administered per provider Patient and daughter instructed to recheck BP within 30-60 minutes. Patient discharged to home in stable condition

## 2014-09-03 ENCOUNTER — Other Ambulatory Visit: Payer: Self-pay

## 2014-09-08 ENCOUNTER — Telehealth: Payer: Self-pay | Admitting: Cardiology

## 2014-09-08 NOTE — Telephone Encounter (Signed)
Received 8 pages records from [facility] Dr Horald Pollen for appointment with Kerin Ransom, [place] on 09/09/14.  Records given to [employer] (medical records) for Luke's schedule on 09/09/14 lp

## 2014-09-09 ENCOUNTER — Encounter: Payer: Self-pay | Admitting: Cardiology

## 2014-09-09 ENCOUNTER — Ambulatory Visit (INDEPENDENT_AMBULATORY_CARE_PROVIDER_SITE_OTHER): Payer: Medicare Other | Admitting: Cardiology

## 2014-09-09 ENCOUNTER — Other Ambulatory Visit: Payer: Self-pay | Admitting: *Deleted

## 2014-09-09 VITALS — BP 131/61 | HR 61 | Ht 62.0 in | Wt 267.7 lb

## 2014-09-09 DIAGNOSIS — I10 Essential (primary) hypertension: Secondary | ICD-10-CM

## 2014-09-09 DIAGNOSIS — I5033 Acute on chronic diastolic (congestive) heart failure: Secondary | ICD-10-CM

## 2014-09-09 DIAGNOSIS — J9611 Chronic respiratory failure with hypoxia: Secondary | ICD-10-CM

## 2014-09-09 DIAGNOSIS — N184 Chronic kidney disease, stage 4 (severe): Secondary | ICD-10-CM

## 2014-09-09 DIAGNOSIS — I739 Peripheral vascular disease, unspecified: Secondary | ICD-10-CM

## 2014-09-09 DIAGNOSIS — E1121 Type 2 diabetes mellitus with diabetic nephropathy: Secondary | ICD-10-CM

## 2014-09-09 DIAGNOSIS — E1159 Type 2 diabetes mellitus with other circulatory complications: Secondary | ICD-10-CM

## 2014-09-09 DIAGNOSIS — E1169 Type 2 diabetes mellitus with other specified complication: Secondary | ICD-10-CM

## 2014-09-09 NOTE — Assessment & Plan Note (Signed)
On chronic O2 

## 2014-09-09 NOTE — Assessment & Plan Note (Signed)
Hospitalized 8/5-8/9, and again 9/18-9/22. EF 55-60% with grade 2 diastolic dysfunction.

## 2014-09-09 NOTE — Progress Notes (Signed)
09/09/2014 [name]   [birth year]  [id number]  Primary [place], [name] Primary Cardiologist: Dr Ellyn Hack  HPI:  64 year-old morbidly obese, sedentary, AA woman with multiple medical problems.The patient has severe PAD and underwent left BKA in September 2014. She had an inferior STEMI in May 2010 and received an RCA BMS. Her EF has been preserved. Other problems include Stage 4 CKD, Type 2 diabetes, and hypertension. She was admitted 06/23/14 with acute on chronic diastolic CHF. We saw her in consult then. Echo 06/23/14 showed an EF of 55-60% with grade 2 diastolic dysfunction. She was discharged 06/27/14 and then re admitted 08/06/14 for CHF. We did not see her during the second admission. She was seen by the renal service then. She was discharged 08/10/14.  She is in the office today for follow up. Her daughter accompanied her. The pt has been stable since discharge. She is fluid restricted and  has been watching her sodium intake. She has been followed by the renal service and has a follow up in a few weeks.      Current Outpatient Prescriptions  Medication Sig Dispense Refill  . amLODipine (NORVASC) 5 MG tablet Take 5 mg by mouth daily.      Marland Kitchen aspirin 325 MG tablet Take 325 mg by mouth every morning.       Marland Kitchen atorvastatin (LIPITOR) 80 MG tablet Take 1 tablet (80 mg total) by mouth daily at 6 PM.  30 tablet  1  . buPROPion (WELLBUTRIN XL) 150 MG 24 hr tablet Take 1 tablet by mouth daily.      . carvedilol (COREG) 25 MG tablet Take 1 tablet (25 mg total) by mouth 2 (two) times daily with a meal.  60 tablet  1  . ferrous sulfate 325 (65 FE) MG tablet Take 325 mg by mouth daily with breakfast.      . furosemide (LASIX) 40 MG tablet Take 40 mg by mouth 2 (two) times daily.       Marland Kitchen glucose blood (ACCU-CHEK AVIVA PLUS) test strip USE ONE STRIP TO CHECK GLUCOSE BEFORE MEALS AND AT BEDTIME      . hydrALAZINE (APRESOLINE) 50 MG tablet Take 1 tablet (50 mg total) by mouth every 8 (eight)  hours.  90 tablet  1  . insulin aspart (NOVOLOG) 100 UNIT/ML injection Inject 0-15 Units into the skin 3 (three) times daily with meals. Sliding scale insulin CBG 70 - 120: 0 units: CBG 121 - 150: 2 units; CBG 151 - 200: 3 units; CBG 201 - 250: 5 units; CBG 251 - 300: 8 units;CBG 301 - 350: 11 units; CBG 351 - 400: 15 units; CBG > 400 : 15 units and notify your doctor  1 vial  12  . insulin glargine (LANTUS) 100 UNIT/ML injection Inject 0.4 mLs (40 Units total) into the skin at bedtime.  10 mL  11  . Insulin Pen Needle 31G X 8 MM MISC USE TO ADMINISTER INSULIN VIA LANTUS AND NOVOLOG 4 TIMES DAILY      . Lancets (FREESTYLE) lancets Use as directed to check blood sugar 3 times daily      . nystatin-triamcinolone ointment (MYCOLOG) Apply 1 application topically 2 (two) times daily.      . Omega-3 Fatty Acids (FISH OIL) 1000 MG CAPS Take 1 capsule by mouth 2 (two) times daily.      . pregabalin (LYRICA) 75 MG capsule Take 75 mg by mouth 2 (two) times daily.      Marland Kitchen  OXYGEN Inhale 2.5 L into the lungs.       No current facility-administered medications for this visit.    No Known Allergies  History   Social History  . Marital Status: Widowed    Spouse Name: N/A    Number of Children: 4  . Years of Education: N/A   Occupational History  .     Social History Main Topics  . Smoking status: Former Smoker    Quit date: 04/29/1995  . Smokeless tobacco: Never Used  . Alcohol Use: No  . Drug Use: No  . Sexual Activity: No   Other Topics Concern  . Not on file   Social History Narrative   Was previously living in an assisted living center while recovering from her stroke. Lesion is now recently moved back home with her family.    She is attended by her daughter, who is super morbidly obese.              Review of Systems: General: negative for chills, fever, night sweats or weight changes.  Cardiovascular: negative for chest pain, dyspnea on exertion, edema, orthopnea, palpitations,  paroxysmal nocturnal dyspnea or shortness of breath Dermatological: negative for rash Respiratory: negative for cough or wheezing Urologic: negative for hematuria Abdominal: negative for nausea, vomiting, diarrhea, bright red blood per rectum, melena, or hematemesis Neurologic: negative for visual changes, syncope, or dizziness All other systems reviewed and are otherwise negative except as noted above.    Blood pressure 131/61, pulse 61, height 5\' 2"  (1.575 m), weight 267 lb 11.2 oz (121.428 kg).  General appearance: alert, cooperative, no distress, morbidly obese and in a wheelchair, on O2 Lungs: clear to auscultation bilaterally Heart: regular rate and rhythm Extremities: Lt leg prosthesis present   ASSESSMENT AND PLAN:   Acute on chronic diastolic congestive heart failure Hospitalized 8/5-8/9, and again 9/18-9/22. EF 55-60% with grade 2 diastolic dysfunction.   Chronic kidney disease (CKD), stage IV (severe) Followed by Dr Louie Boston  Severe obesity (BMI >= 40) BMI 48  Type 2 diabetes mellitus with renal manifestations .  Peripheral arterial disease Lt BKA Sept 2014  Chronic respiratory failure with hypoxia On chronic O2  Hypertension associated with diabetes B/P controlled  CAD (coronary artery disease), native coronary artery S/P RCA BMS in setting of STEMI May 2010. No angina.   PLAN  Same cardiac Rx. F/U Dr Ellyn Hack in 3-4 months. Keep apt with renal service.   [name] KPA-C 09/09/2014 10:42 AM

## 2014-09-09 NOTE — Assessment & Plan Note (Addendum)
BMI 48 

## 2014-09-09 NOTE — Patient Instructions (Signed)
Your physician recommends that you schedule a follow-up appointment in: 4 Months with Dr Ellyn Hack

## 2014-09-09 NOTE — Assessment & Plan Note (Signed)
S/P RCA BMS in setting of STEMI May 2010. No angina.

## 2014-09-09 NOTE — Assessment & Plan Note (Signed)
Lt BKA Sept 2014

## 2014-09-09 NOTE — Assessment & Plan Note (Signed)
B/P controlled 

## 2014-09-09 NOTE — Assessment & Plan Note (Signed)
Followed by Dr Louie Boston

## 2014-09-15 ENCOUNTER — Ambulatory Visit: Payer: Medicare Other | Attending: Internal Medicine | Admitting: Internal Medicine

## 2014-09-15 VITALS — BP 144/65 | HR 66 | Temp 98.3°F | Ht 62.0 in | Wt 265.4 lb

## 2014-09-15 DIAGNOSIS — I129 Hypertensive chronic kidney disease with stage 1 through stage 4 chronic kidney disease, or unspecified chronic kidney disease: Secondary | ICD-10-CM | POA: Diagnosis not present

## 2014-09-15 DIAGNOSIS — D631 Anemia in chronic kidney disease: Secondary | ICD-10-CM | POA: Insufficient documentation

## 2014-09-15 DIAGNOSIS — E119 Type 2 diabetes mellitus without complications: Secondary | ICD-10-CM | POA: Diagnosis not present

## 2014-09-15 DIAGNOSIS — I5033 Acute on chronic diastolic (congestive) heart failure: Secondary | ICD-10-CM | POA: Diagnosis present

## 2014-09-15 DIAGNOSIS — D638 Anemia in other chronic diseases classified elsewhere: Secondary | ICD-10-CM

## 2014-09-15 DIAGNOSIS — Z9981 Dependence on supplemental oxygen: Secondary | ICD-10-CM | POA: Diagnosis not present

## 2014-09-15 DIAGNOSIS — J9611 Chronic respiratory failure with hypoxia: Secondary | ICD-10-CM | POA: Diagnosis not present

## 2014-09-15 DIAGNOSIS — Z794 Long term (current) use of insulin: Secondary | ICD-10-CM | POA: Diagnosis not present

## 2014-09-15 DIAGNOSIS — I2510 Atherosclerotic heart disease of native coronary artery without angina pectoris: Secondary | ICD-10-CM | POA: Diagnosis not present

## 2014-09-15 DIAGNOSIS — Z89512 Acquired absence of left leg below knee: Secondary | ICD-10-CM | POA: Insufficient documentation

## 2014-09-15 DIAGNOSIS — Z8673 Personal history of transient ischemic attack (TIA), and cerebral infarction without residual deficits: Secondary | ICD-10-CM | POA: Diagnosis not present

## 2014-09-15 DIAGNOSIS — Z6841 Body Mass Index (BMI) 40.0 and over, adult: Secondary | ICD-10-CM | POA: Insufficient documentation

## 2014-09-15 DIAGNOSIS — Z9861 Coronary angioplasty status: Secondary | ICD-10-CM | POA: Insufficient documentation

## 2014-09-15 DIAGNOSIS — N184 Chronic kidney disease, stage 4 (severe): Secondary | ICD-10-CM

## 2014-09-15 DIAGNOSIS — I739 Peripheral vascular disease, unspecified: Secondary | ICD-10-CM | POA: Insufficient documentation

## 2014-09-15 DIAGNOSIS — Z7982 Long term (current) use of aspirin: Secondary | ICD-10-CM | POA: Diagnosis not present

## 2014-09-15 DIAGNOSIS — I5031 Acute diastolic (congestive) heart failure: Secondary | ICD-10-CM | POA: Diagnosis not present

## 2014-09-15 DIAGNOSIS — N189 Chronic kidney disease, unspecified: Secondary | ICD-10-CM

## 2014-09-15 DIAGNOSIS — E785 Hyperlipidemia, unspecified: Secondary | ICD-10-CM | POA: Insufficient documentation

## 2014-09-15 DIAGNOSIS — I10 Essential (primary) hypertension: Secondary | ICD-10-CM

## 2014-09-15 DIAGNOSIS — E1122 Type 2 diabetes mellitus with diabetic chronic kidney disease: Secondary | ICD-10-CM

## 2014-09-15 MED ORDER — INSULIN GLARGINE 100 UNIT/ML ~~LOC~~ SOLN: 45.0000 [IU] | Freq: Every day | SUBCUTANEOUS | Status: DC

## 2014-09-15 NOTE — Progress Notes (Signed)
Patient present here for her 2 week follow up with [facility]. Patient states that she checked her sugars this morning not fasting around 10 am and reading was 202. Patient states that she is in no pain at the moment. Patient is here with her daughter today and has confirmed what medication patient is taking and states that she does take eguarlly not missing dosage. Patient BP is 144/65 and O2 is 91% on room air.

## 2014-09-15 NOTE — Progress Notes (Signed)
Patient ID: [name], female   DOB: 1951/10/02, 63 y.o.   MRN: [id number]                                     [facility] Discharge Acute Issues Care Follow Up                                                                        Patient Demographics  [name], is a 64 y.o. female  DOB [birth year]  MRN [id number].  Primary MD  Powers, Charolotte Eke   Reason for TCC follow Up - posthospital discharge, recurrent CHF   Past Medical History  Diagnosis Date  . ST elevation myocardial infarction (STEMI) of inferior wall  03/2009    Ostial/proximal RCA occlusion treated with BMS stent  . CAD (coronary artery disease), native coronary artery      status post PCI to the RCA for inferior STEMI  . Cancer     endo ca  . Hypertension associated with diabetes   . Hyperlipidemia LDL goal <70   . Diabetes mellitus, type II, insulin dependent     With complications - CAD, CVA, peripheral ulcer ,  . Stroke/cerebrovascular accident  4/30/ 2014    Right-sided weakness, mostly with balance issues and only mild weakness.  . Diabetic peripheral neuropathy associated with type 2 diabetes mellitus   . Obesity, Class III, BMI 40-49.9 (morbid obesity)     BMI 43; 5' 2',  235 pounds 6.4 ounces  . CKD (chronic kidney disease), stage IV     Recent acute on chronic exacerbation in July 2014  . PAD (peripheral artery disease)   . Diabetic foot ulcer   . Dry gangrene     Left second toe; now on Sole of L foot    Past Surgical History  Procedure Laterality Date  . Abdominal hysterectomy    . Leg tendon surgery      patient fell in a store right leg  . Leg surgery      hole in bone( during Childhood)  . Cardiac catheterization  03/31/2009    Proximal RCA thrombotic occlusion (inferior STEMI) other coronaries but in a codominant system  . Coronary angioplasty  03/31/2009    PTCA , bare-metal stent 3.5 mm x 24 mm ostium of RCA ,EF 55%  . Amputation Left 08/07/2013   Procedure: left midfoot amputation;  Surgeon: Marianna Payment, MD;  Location: [facility];  Service: Orthopedics;  Laterality: Left;  . Amputation Left 08/09/2013    Procedure: AMPUTATION BELOW KNEE;  Surgeon: Marianna Payment, MD;  Location: [facility];  Service: Orthopedics;  Laterality: Left;   Recent HPI and Hospital Course Patient is a 63 year old female with chronic kidney disease stage IV (being followed by Dr. Moshe Cipro), chronic diastolic heart failure (primary cardiologist Dr. Richardo Priest. Gwenlyn Found with parent, chronic respiratory failure on home O2, chronic anemia secondary to kidney disease, CAD status post PTCI, history of peripheral vascular disease status post left BKA, history of CVA, diabetes, hypertension, DM (last A1C 8.2) has had a recurrent hospitalization for acute chronic diastolic CHF in the setting of worsening  chronic kidney disease stage IV. Since discharge from the hospital on 9/22, she has been doing well, this is her third transitional care clinic visit. She has also had followup with nephrology and cardiology in the interim. Await today is down to 265 pounds, discharge weight was 271 pounds.    [facility] Acute Care Issue to be followed in the Clinic The patient's renal function, hemoglobin, weight was followed closely. All of these issues appear to be stable.  Subjective:   [name] today has, No headache, No chest pain, No abdominal pain - No Nausea, No new weakness tingling or numbness, No Cough - SOB.   Assessment & Plan    Patient Active Problem List   Diagnosis Date Noted  . Pulmonary edema 08/06/2014  . Acute pulmonary edema 08/06/2014  . CKD (chronic kidney disease), stage III 08/06/2014  . S/P BKA (below knee amputation) unilateral 08/06/2014  . Chronic respiratory failure with hypoxia 08/06/2014  . Acute on chronic diastolic CHF (congestive heart failure), NYHA class 4 06/23/2014  . HTN (hypertension) 06/23/2014  . AKI (acute kidney injury)  01/12/2014  . Acute on chronic diastolic congestive heart failure 01/12/2014  . Acute on chronic diastolic CHF (congestive heart failure), NYHA class 3 01/11/2014  . UTI (lower urinary tract infection) 01/11/2014  . Acute CHF 01/11/2014  . Anemia in chronic kidney disease(285.21) 09/25/2013  . Gangrene 09/21/2013  . Acute posthemorrhagic anemia 09/21/2013  . Foot infection 08/06/2013  . Hyponatremia 08/06/2013  . Chronic kidney disease (CKD), stage IV (severe) 08/06/2013  . Severe obesity (BMI >= 40) 06/22/2013    Class: Chronic  . Peripheral arterial disease 05/01/2013  . CAD (coronary artery disease), native coronary artery   . Obesity, Class III, BMI 40-49.9 (morbid obesity)   . Hyperlipidemia LDL goal <70   . Hypertension associated with diabetes   . Unspecified constipation 04/16/2013  . Type 2 diabetes mellitus with renal manifestations 04/16/2013  . Secondary renovascular hypertension, benign 04/09/2013  . Headache(784.0) 03/18/2013  . Nausea and vomiting 03/18/2013  . Acute on chronic renal failure 03/18/2013  . Anemia of chronic disease 03/18/2013  . CVA (cerebral infarction) 03/18/2013  . Non-ketotic hyperosmolar coma 03/18/2013   1. Recurrent admissions for Acute on chronic diastolic heart failure  - Compensated currently, weight down to 265 pounds, discharge weight from hospital on 08/10/14  271 pounds. Has been doing really well, with stable renal function, she remains clinically compensated. Continue current Lasix dose of 40 mg twice a day. Patient has been counseled regarding the importance of weighing herself daily, and if she has a rapid weight gain, she knows that she needs to contact her primary cardiologist or primary care practitioner. Since she is doing exceedingly well, she is stable to be transitioned back to her primary care practitioner Dr. Joneen Caraway.  2. Chronic kidney disease stage IV  - Followed by Dr. Sheilah Pigeon kidney- last creatinine  stable at 2.7.  3. Type 2 diabetes  - Last A1c 8.2 on 08/06/14  - Asked patient to keep a CBG log, and bring to her next visit to PCP. For now, we'll increase Lantus to 45 units, continue SSI  4. Hypertension  - Currently controlled. Continue with Cardizem 50 mg 3 times a day, Coreg 25 mg twice a day, amlodipine 5 mg daily, and Lasix 40 mg twice daily.   5. Anemia  - Is secondary to chronic kidney disease. Currently on iron supplementation, will need to recheck CBC frequency at the discretion  of her primary care practitioner in primary nephrologist.  6. History of CVA  - continue with aspirin statin  7. History of CAD status post PCI  - Stable, without any chest pain or shortness of breath. Continue with aspirin, statin and beta blockers. Has followup with cardiology-Dr. Donnella Bi  8. History of peripheral vascular disease-status post left BKA  - Continue with aggressive risk factor modification.ontinue with aspirin, statin. Blood pressure seems to be adequately controlled.   9. Dyslipidemia  -continue with statin, check lipid panel periodically. Last LDL on 10/13 at 115, goal is less than 100  10. Morbid obesity  - Counseled regarding importance of weight loss  Reason for frequent admissions/ER visits: Recurrent acute diastolic heart failure, no hospitalizations since her discharge on 08/10/14.  Health Maintenance: Further health maintenance deferred to her primary care practitioner Dr. Joneen Caraway at [facility].  Next follow up visit at [facility] - patient's last hospital discharge was on 08/10/14, she has not had a a admission since then. She does not need further followup at the transitional care clinic. She has a followup scheduled with her primary care practitioner on 09/27/2014.  Any follow ups: Followup with her primary care practitioner-Dr. Joneen Caraway on 09/27/14   Objective:   Filed Vitals:   09/15/14 1101  BP: 144/65  Pulse: 66  Temp: 98.3 F (36.8  C)  TempSrc: Oral  Height: 5\' 2"  (1.575 m)  Weight: 265 lb 6.4 oz (120.385 kg)  SpO2: 91%    Wt Readings from Last 3 Encounters:  09/15/14 265 lb 6.4 oz (120.385 kg)  09/09/14 267 lb 11.2 oz (121.428 kg)  09/01/14 268 lb 6.4 oz (121.745 kg)      Medication List       This list is accurate as of: 09/15/14 11:30 AM.  Always use your most recent med list.               ACCU-CHEK AVIVA PLUS test strip  Generic drug:  glucose blood  USE ONE STRIP TO CHECK GLUCOSE BEFORE MEALS AND AT BEDTIME     amLODipine 5 MG tablet  Commonly known as:  NORVASC  Take 5 mg by mouth daily.     aspirin 325 MG tablet  Take 325 mg by mouth every morning.     atorvastatin 80 MG tablet  Commonly known as:  LIPITOR  Take 1 tablet (80 mg total) by mouth daily at 6 PM.     carvedilol 25 MG tablet  Commonly known as:  COREG  Take 1 tablet (25 mg total) by mouth 2 (two) times daily with a meal.     ferrous sulfate 325 (65 FE) MG tablet  Take 325 mg by mouth daily with breakfast.     Fish Oil 1000 MG Caps  Take 1 capsule by mouth 2 (two) times daily.     freestyle lancets  Use as directed to check blood sugar 3 times daily     furosemide 40 MG tablet  Commonly known as:  LASIX  Take 40 mg by mouth 2 (two) times daily.     hydrALAZINE 50 MG tablet  Commonly known as:  APRESOLINE  Take 1 tablet (50 mg total) by mouth every 8 (eight) hours.     insulin aspart 100 UNIT/ML injection  Commonly known as:  novoLOG  Inject 0-15 Units into the skin 3 (three) times daily with meals. Sliding scale insulin CBG 70 - 120: 0 units: CBG 121 - 150: 2 units;  CBG 151 - 200: 3 units; CBG 201 - 250: 5 units; CBG 251 - 300: 8 units;CBG 301 - 350: 11 units; CBG 351 - 400: 15 units; CBG > 400 : 15 units and notify your doctor     insulin glargine 100 UNIT/ML injection  Commonly known as:  LANTUS  Inject 0.45 mLs (45 Units total) into the skin at bedtime.     Insulin Pen Needle 31G X 8 MM Misc  USE TO  ADMINISTER INSULIN VIA LANTUS AND NOVOLOG 4 TIMES DAILY     nystatin-triamcinolone ointment  Commonly known as:  MYCOLOG  Apply 1 application topically 2 (two) times daily.     OXYGEN  Inhale 2.5 L into the lungs.     pregabalin 75 MG capsule  Commonly known as:  LYRICA  Take 75 mg by mouth 2 (two) times daily.     WELLBUTRIN XL 150 MG 24 hr tablet  Generic drug:  buPROPion  Take 1 tablet by mouth daily.       Physical Exam  Awake Alert, Oriented X 3, No new F.N deficits, Normal affect [place].AT,PERRAL Supple Neck,No JVD, No cervical lymphadenopathy appriciated.  Symmetrical Chest wall movement, Good air movement bilaterally, CTAB RRR,No Gallops,Rubs or new Murmurs, No Parasternal Heave +ve B.Sounds, Abd Soft, No tenderness, No organomegaly appriciated, No rebound - guarding or rigidity. No Cyanosis, Clubbing , trace right leg edema, No new Rash or bruise Left BKA   Data Review   Micro Results No results found for this or any previous visit (from the past 240 hour(s)).   CBC No results found for this basename: WBC, HGB, HCT, PLT, MCV, MCH, MCHC, RDW, NEUTRABS, LYMPHSABS, MONOABS, EOSABS, BASOSABS, BANDABS, BANDSABD,  in the last 168 hours  Chemistries  No results found for this basename: NA, K, CL, CO2, GLUCOSE, BUN, CREATININE, GFRCGP, CALCIUM, MG, AST, ALT, ALKPHOS, BILITOT,  in the last 168 hours ------------------------------------------------------------------------------------------------------------------ estimated creatinine clearance is 26.3 ml/min (by C-G formula based on Cr of 2.7). ------------------------------------------------------------------------------------------------------------------ No results found for this basename: HGBA1C,  in the last 72 hours ------------------------------------------------------------------------------------------------------------------ No results found for this basename: CHOL, HDL, LDLCALC, TRIG, CHOLHDL, LDLDIRECT,  in the  last 72 hours ------------------------------------------------------------------------------------------------------------------ No results found for this basename: TSH, T4TOTAL, FREET3, T3FREE, THYROIDAB,  in the last 72 hours ------------------------------------------------------------------------------------------------------------------ No results found for this basename: VITAMINB12, FOLATE, FERRITIN, TIBC, IRON, RETICCTPCT,  in the last 72 hours  Coagulation profile No results found for this basename: INR, PROTIME,  in the last 168 hours  No results found for this basename: DDIMER,  in the last 72 hours  Cardiac Enzymes No results found for this basename: CK, CKMB, TROPONINI, MYOGLOBIN,  in the last 168 hours ------------------------------------------------------------------------------------------------------------------ No components found with this basename: POCBNP,    Time Spent in minutes  5   GHIMIRE,SHANKER M.D on 09/15/2014 at 11:30 AM

## 2014-10-18 ENCOUNTER — Other Ambulatory Visit: Payer: Self-pay

## 2014-10-18 DIAGNOSIS — Z01810 Encounter for preprocedural cardiovascular examination: Secondary | ICD-10-CM

## 2014-10-18 DIAGNOSIS — N184 Chronic kidney disease, stage 4 (severe): Secondary | ICD-10-CM

## 2014-10-19 ENCOUNTER — Other Ambulatory Visit: Payer: Self-pay

## 2014-10-19 DIAGNOSIS — N184 Chronic kidney disease, stage 4 (severe): Secondary | ICD-10-CM

## 2014-10-19 DIAGNOSIS — Z01810 Encounter for preprocedural cardiovascular examination: Secondary | ICD-10-CM

## 2014-10-22 ENCOUNTER — Other Ambulatory Visit: Payer: Self-pay | Admitting: Internal Medicine

## 2014-11-16 ENCOUNTER — Encounter: Payer: Self-pay | Admitting: Vascular Surgery

## 2014-11-17 ENCOUNTER — Ambulatory Visit (HOSPITAL_COMMUNITY)
Admission: RE | Admit: 2014-11-17 | Discharge: 2014-11-17 | Disposition: A | Discharge status: Home / Self Care | Payer: Medicare Other | Source: Ambulatory Visit | Attending: Vascular Surgery | Admitting: Vascular Surgery

## 2014-11-17 ENCOUNTER — Other Ambulatory Visit: Payer: Self-pay

## 2014-11-17 ENCOUNTER — Encounter: Payer: Self-pay | Admitting: Vascular Surgery

## 2014-11-17 ENCOUNTER — Ambulatory Visit (INDEPENDENT_AMBULATORY_CARE_PROVIDER_SITE_OTHER): Payer: Medicare Other | Admitting: Vascular Surgery

## 2014-11-17 ENCOUNTER — Ambulatory Visit (INDEPENDENT_AMBULATORY_CARE_PROVIDER_SITE_OTHER)
Admission: RE | Admit: 2014-11-17 | Discharge: 2014-11-17 | Disposition: A | Discharge status: Home / Self Care | Payer: Medicare Other | Source: Ambulatory Visit | Attending: Vascular Surgery | Admitting: Vascular Surgery

## 2014-11-17 VITALS — BP 191/72 | HR 68 | Resp 16 | Ht 62.0 in | Wt 258.0 lb

## 2014-11-17 DIAGNOSIS — Z01810 Encounter for preprocedural cardiovascular examination: Secondary | ICD-10-CM | POA: Diagnosis present

## 2014-11-17 DIAGNOSIS — N189 Chronic kidney disease, unspecified: Secondary | ICD-10-CM | POA: Insufficient documentation

## 2014-11-17 DIAGNOSIS — N184 Chronic kidney disease, stage 4 (severe): Secondary | ICD-10-CM

## 2014-11-17 NOTE — Assessment & Plan Note (Signed)
Based on her vein mapping, she appears to be a candidate for a fistula in the left arm. Most likely this would be a left brachiocephalic fistula.I have explained the indications for placement of an AV fistula or AV graft. I've explained that if at all possible we will place an AV fistula.  I have reviewed the risks of placement of an AV fistula including but not limited to: failure of the fistula to mature, need for subsequent interventions, and thrombosis. In addition I have reviewed the potential complications of placement of an AV graft.  These risks include, but are not limited to, graft thrombosis, graft infection, wound healing problems, bleeding, arm swelling, and steal syndrome. All the patient's questions were answered and they are agreeable to proceed with surgery. Her surgery has been scheduled for 12/03/2014.

## 2014-11-17 NOTE — Progress Notes (Signed)
Patient ID: [name], female   DOB: Dec 09, 1950, 63 y.o.   MRN: [id number]  Reason for Consult: Evaluate for hemodialysis access   Referred by Dr Corliss Parish  Subjective:     HPI:  [name] is a 63 y.o. female who has stage IV chronic kidney disease. She is not yet on dialysis. However she is approaching the need for dialysis and was sent for evaluation for hemodialysis access. She denies any recent uremic symptoms. Specifically she denies nausea, vomiting, fatigue, anorexia, or palpitations. She is right-handed.  I have reviewed her records from [facility]. She has chronic kidney disease secondary to long-standing and poorly controlled diabetes and hypertension. On 10/06/2014, her GFR was 19. In addition to her renal insufficiency, she has a history of morbid obesity, acute on chronic congestive heart failure, and anemia of chronic disease.  Past Medical History  Diagnosis Date  . ST elevation myocardial infarction (STEMI) of inferior wall  03/2009    Ostial/proximal RCA occlusion treated with BMS stent  . CAD (coronary artery disease), native coronary artery      status post PCI to the RCA for inferior STEMI  . Cancer     endo ca  . Hypertension associated with diabetes   . Hyperlipidemia LDL goal <70   . Diabetes mellitus, type II, insulin dependent     With complications - CAD, CVA, peripheral ulcer ,  . Stroke/cerebrovascular accident  4/30/ 2014    Right-sided weakness, mostly with balance issues and only mild weakness.  . Diabetic peripheral neuropathy associated with type 2 diabetes mellitus   . Obesity, Class III, BMI 40-49.9 (morbid obesity)     BMI 43; 5' 2',  235 pounds 6.4 ounces  . CKD (chronic kidney disease), stage IV     Recent acute on chronic exacerbation in July 2014  . PAD (peripheral artery disease)   . Diabetic foot ulcer   . Dry gangrene     Left second toe; now on Sole of L foot  . Anemia   . CHF (congestive heart  failure)    Family History  Problem Relation Age of Onset  . Alcoholism Mother     Died from complications  . Alcoholism Father     Died from complications  . Cancer Father     Throat  . Pneumonia Father   . Diabetes Father   . Hypertension Father   . Heart disease      Unknown, unclear. Patient is not a good historian.  Essentially no pertinent history known   Past Surgical History  Procedure Laterality Date  . Abdominal hysterectomy    . Leg tendon surgery      patient fell in a store right leg  . Leg surgery      hole in bone( during Childhood)  . Cardiac catheterization  03/31/2009    Proximal RCA thrombotic occlusion (inferior STEMI) other coronaries but in a codominant system  . Coronary angioplasty  03/31/2009    PTCA , bare-metal stent 3.5 mm x 24 mm ostium of RCA ,EF 55%  . Amputation Left 08/07/2013    Procedure: left midfoot amputation;  Surgeon: Marianna Payment, MD;  Location: [facility];  Service: Orthopedics;  Laterality: Left;  . Amputation Left 08/09/2013    Procedure: AMPUTATION BELOW KNEE;  Surgeon: Marianna Payment, MD;  Location: [facility];  Service: Orthopedics;  Laterality: Left;    Short Social History:  History  Substance Use Topics  . Smoking status:  Former Smoker    Quit date: 04/29/1995  . Smokeless tobacco: Never Used  . Alcohol Use: No    No Known Allergies  Current Outpatient Prescriptions  Medication Sig Dispense Refill  . aspirin 81 MG tablet Take 81 mg by mouth daily.    Marland Kitchen atorvastatin (LIPITOR) 80 MG tablet Take 1 tablet (80 mg total) by mouth daily at 6 PM. 30 tablet 1  . buPROPion (WELLBUTRIN XL) 150 MG 24 hr tablet Take 1 tablet by mouth daily.    . carvedilol (COREG) 25 MG tablet Take 1 tablet (25 mg total) by mouth 2 (two) times daily with a meal. 60 tablet 1  . ferrous sulfate 325 (65 FE) MG tablet Take 325 mg by mouth daily with breakfast.    . furosemide (LASIX) 40 MG tablet Take 40 mg by mouth 2 (two) times daily.     Marland Kitchen  glucose blood (ACCU-CHEK AVIVA PLUS) test strip USE ONE STRIP TO CHECK GLUCOSE BEFORE MEALS AND AT BEDTIME    . hydrALAZINE (APRESOLINE) 50 MG tablet Take 1 tablet (50 mg total) by mouth every 8 (eight) hours. 90 tablet 1  . insulin aspart (NOVOLOG) 100 UNIT/ML injection Inject 0-15 Units into the skin 3 (three) times daily with meals. Sliding scale insulin CBG 70 - 120: 0 units: CBG 121 - 150: 2 units; CBG 151 - 200: 3 units; CBG 201 - 250: 5 units; CBG 251 - 300: 8 units;CBG 301 - 350: 11 units; CBG 351 - 400: 15 units; CBG > 400 : 15 units and notify your doctor 1 vial 12  . insulin glargine (LANTUS) 100 UNIT/ML injection Inject 0.45 mLs (45 Units total) into the skin at bedtime. 10 mL 11  . Insulin Pen Needle 31G X 8 MM MISC USE TO ADMINISTER INSULIN VIA LANTUS AND NOVOLOG 4 TIMES DAILY    . Lancets (FREESTYLE) lancets Use as directed to check blood sugar 3 times daily    . nystatin-triamcinolone ointment (MYCOLOG) Apply 1 application topically 2 (two) times daily.    . Omega-3 Fatty Acids (FISH OIL) 1000 MG CAPS Take 1 capsule by mouth 2 (two) times daily.    . pregabalin (LYRICA) 75 MG capsule Take 75 mg by mouth 2 (two) times daily.    Marland Kitchen amLODipine (NORVASC) 5 MG tablet Take 5 mg by mouth daily.    Marland Kitchen aspirin 325 MG tablet Take 325 mg by mouth every morning.     . OXYGEN Inhale 2.5 L into the lungs.     No current facility-administered medications for this visit.    Review of Systems  Constitutional: Negative for chills and fever.  Eyes: Positive for loss of vision.   Respiratory: Negative for cough and wheezing.  Cardiovascular: Negative for chest pain, chest tightness, claudication, dyspnea with exertion, orthopnea and palpitations.  GI: Negative for blood in stool and vomiting.  GU: Negative for dysuria and hematuria.  Musculoskeletal: Negative for leg pain, joint pain and myalgias.  Skin: Positive for rash. Negative for wound.  Neurological: Negative for dizziness and speech  difficulty.  Hematologic: Negative for bruises/bleeds easily. Psychiatric: Negative for depressed mood.        Objective:  Objective  Filed Vitals:   11/17/14 1223  BP: 191/72  Pulse: 68  Resp: 16  Height: 5\' 2"  (1.575 m)  Weight: 258 lb (117.028 kg)  SpO2: 100%   Body mass index is 47.18 kg/(m^2).  Physical Exam  Constitutional: She is oriented to person, place, and time. She  appears well-developed and well-nourished.  HENT:  Head: Normocephalic and atraumatic.  Neck: Neck supple. No JVD present. No thyromegaly present.  Cardiovascular: Normal rate, regular rhythm, normal heart sounds and normal pulses.  Exam reveals no friction rub.   No murmur heard. Pulses:      Radial pulses are 2+ on the right side, and 2+ on the left side.  I do not detect carotid bruits.  Pulmonary/Chest: Breath sounds normal. She has no wheezes. She has no rales.  Abdominal: Soft. Bowel sounds are normal. There is no tenderness.  Musculoskeletal: Normal range of motion. She exhibits no edema.  Lymphadenopathy:    She has no cervical adenopathy.  Neurological: She is alert and oriented to person, place, and time. She has normal strength. No sensory deficit.  Skin: No lesion and no rash noted.  Psychiatric: She has a normal mood and affect.    Data: I have independently interpreted her arterial Doppler study. She has a triphasic waveform in the radial and ulnar positions bilaterally.  I have independently interpreted her vein mapping. On the left side, the forearm cephalic vein looks marginal in size. The upper arm cephalic vein looks reasonable in size. The basilic vein looks reasonable in size in the left although it empties into the brachial system early.  On the right side, the forearm cephalic vein looks marginal in size. The upper arm cephalic vein on the right looks to be potentially adequate. The basilic vein on the right appears to be adequate in size.      Assessment/Plan:      Chronic kidney disease (CKD), stage IV (severe) Based on her vein mapping, she appears to be a candidate for a fistula in the left arm. Most likely this would be a left brachiocephalic fistula.I have explained the indications for placement of an AV fistula or AV graft. I've explained that if at all possible we will place an AV fistula.  I have reviewed the risks of placement of an AV fistula including but not limited to: failure of the fistula to mature, need for subsequent interventions, and thrombosis. In addition I have reviewed the potential complications of placement of an AV graft.  These risks include, but are not limited to, graft thrombosis, graft infection, wound healing problems, bleeding, arm swelling, and steal syndrome. All the patient's questions were answered and they are agreeable to proceed with surgery. Her surgery has been scheduled for 12/03/2014.    Angelia Mould MD [facility]

## 2014-11-29 ENCOUNTER — Other Ambulatory Visit: Payer: Self-pay | Admitting: Internal Medicine

## 2014-12-02 MED ORDER — CHLORHEXIDINE GLUCONATE CLOTH 2 % EX PADS: 6.0000 | MEDICATED_PAD | Freq: Once | CUTANEOUS | Status: DC

## 2014-12-02 MED ORDER — SODIUM CHLORIDE 0.9 % IV SOLN
INTRAVENOUS | Status: DC
  Administered 2014-12-03 (×2): via INTRAVENOUS

## 2014-12-02 MED ORDER — DEXTROSE 5 % IV SOLN
1.5000 g | INTRAVENOUS | Status: AC
  Administered 2014-12-03: 1.5 g via INTRAVENOUS
  Filled 2014-12-02: qty 1.5

## 2014-12-02 NOTE — Progress Notes (Signed)
Anesthesia Chart Review:  SAME DAY WORK-UP.  Patient is a 64 year old female scheduled for LUE AV fistula tomorrow by Dr. Scot Dock.  History includes former smoker, CAD/inferior STEMI s/p RCA BMS '10, HTN, DM2, CVA '14, morbid obesity, CKD stage IV (not yet on dialysis), anemia, endometrial cancer s/p hysterectomy, chronic diastolic CHF,  PAD s/p left AKA '14 (with GA), home O2 (per Epic notes),. She had hospital admissions in 06/2014 and 07/2014 for acute on chronic diastolic CHF in the setting of worsening CKD.   PCP is Dr. Joneen Caraway who is aware that patient will be undergoing HD access surgery. Nephrologist is Dr. Moshe Cipro. Cardiologist is Dr. Ellyn Hack with last cardiology visit 09/09/14 by Kerin Ransom, PA-C. Recommendations to continue same cardiac RX and keep renal follow-up. She was seen by Dr. Burt Knack and Dr. Percival Spanish during her 06/2014 admission and had an echo, but otherwise no additional cardiac work-up was recommended.   Meds include amlodipine, ASA, Lipitor, Wellbutrin, Coreg, 65 Fe, Lasix, hydralazine, Novolog, Lantus, fish oil, Lyrica.   08/06/14 EKG: SR, low voltage precordial leads, no-specific ST/T wave abnormality.  06/23/14 Echo: Technically difficult study with poor acoustic windows. Normal LV size with mild LV hypertrophy. EF 55-60%. Although no diagnostic regional wall motion abnormality was identified, this possibility cannot be completely excluded on thebasis of this study. Moderate (grade II) diastolic dysfunction. Normal RV size and systolic function. Aortic valvepoorly visualized but probably aortic sclerosis withoutsignificant stenosis.  10/23/10 nuclear stress test: Low risk nuclear study. Evidence for inferior infarct. EF 61%. Inferobasal hypokinesis.  03/31/09 Cardiac cath: LM, CX, LAD are smooth and normal. RCA occluded and filled with thrombus.  S/P RCA BMS.   03/18/13 carotid duplex: Bilaterally <40% internal carotid artery stenosis.Rightvertebral is  difficult to insonate. Arterial flow is noted in the vertebral area, but is atypical in appearance. Left vertebral is patent with antegrade flow.  08/06/14 1V CXR: Vascular congestion, with increased interstitial markings, raising concern for pulmonary edema. Small bilateral pleural effusions suspected.  She is for ISTAT on arrival.  Patient is a same day work-up, so she will be further evaluated by her assigned anesthesiologist on the day of surgery.  She has had cardiology and PCP follow-up since her last hospital admissions.  No new cardiac testing ordered.  CKD and obesity are contributing factors in her diastolic CHF.  If no acute CV/CHF symptoms and labs are acceptable then I would anticipate that she can proceed with HD access surgery.  Case is posted for MAC, although her BMI and respiratory status could influence anesthesia type.    George Hugh [facility]/Anesthesiology Phone [phone] 12/02/2014 11:46 AM

## 2014-12-03 ENCOUNTER — Ambulatory Visit (HOSPITAL_COMMUNITY): Payer: Medicare Other | Admitting: Vascular Surgery

## 2014-12-03 ENCOUNTER — Ambulatory Visit (HOSPITAL_COMMUNITY)
Admission: RE | Admit: 2014-12-03 | Discharge: 2014-12-03 | Disposition: A | Discharge status: Home / Self Care | Payer: Medicare Other | Source: Ambulatory Visit | Attending: Vascular Surgery | Admitting: Vascular Surgery

## 2014-12-03 ENCOUNTER — Other Ambulatory Visit: Payer: Self-pay | Admitting: *Deleted

## 2014-12-03 ENCOUNTER — Encounter (HOSPITAL_COMMUNITY)
Admission: RE | Disposition: A | Discharge status: Home / Self Care | Payer: Self-pay | Source: Ambulatory Visit | Attending: Vascular Surgery

## 2014-12-03 DIAGNOSIS — I120 Hypertensive chronic kidney disease with stage 5 chronic kidney disease or end stage renal disease: Secondary | ICD-10-CM | POA: Diagnosis present

## 2014-12-03 DIAGNOSIS — Z8673 Personal history of transient ischemic attack (TIA), and cerebral infarction without residual deficits: Secondary | ICD-10-CM | POA: Insufficient documentation

## 2014-12-03 DIAGNOSIS — Z6841 Body Mass Index (BMI) 40.0 and over, adult: Secondary | ICD-10-CM | POA: Insufficient documentation

## 2014-12-03 DIAGNOSIS — Z9861 Coronary angioplasty status: Secondary | ICD-10-CM | POA: Insufficient documentation

## 2014-12-03 DIAGNOSIS — I509 Heart failure, unspecified: Secondary | ICD-10-CM | POA: Diagnosis not present

## 2014-12-03 DIAGNOSIS — I2510 Atherosclerotic heart disease of native coronary artery without angina pectoris: Secondary | ICD-10-CM | POA: Diagnosis not present

## 2014-12-03 DIAGNOSIS — Z87891 Personal history of nicotine dependence: Secondary | ICD-10-CM | POA: Insufficient documentation

## 2014-12-03 DIAGNOSIS — Z794 Long term (current) use of insulin: Secondary | ICD-10-CM | POA: Diagnosis not present

## 2014-12-03 DIAGNOSIS — E11621 Type 2 diabetes mellitus with foot ulcer: Secondary | ICD-10-CM | POA: Diagnosis not present

## 2014-12-03 DIAGNOSIS — G709 Myoneural disorder, unspecified: Secondary | ICD-10-CM | POA: Diagnosis not present

## 2014-12-03 DIAGNOSIS — N186 End stage renal disease: Secondary | ICD-10-CM

## 2014-12-03 DIAGNOSIS — Z4931 Encounter for adequacy testing for hemodialysis: Secondary | ICD-10-CM

## 2014-12-03 DIAGNOSIS — Z7982 Long term (current) use of aspirin: Secondary | ICD-10-CM | POA: Diagnosis not present

## 2014-12-03 DIAGNOSIS — I252 Old myocardial infarction: Secondary | ICD-10-CM | POA: Diagnosis not present

## 2014-12-03 DIAGNOSIS — N184 Chronic kidney disease, stage 4 (severe): Secondary | ICD-10-CM

## 2014-12-03 HISTORY — PX: AV FISTULA PLACEMENT: SHX1204

## 2014-12-03 LAB — POCT I-STAT 4, (NA,K, GLUC, HGB,HCT)
GLUCOSE: 224 mg/dL — AB (ref 70–99)
HEMATOCRIT: 31 % — AB (ref 36.0–46.0)
HEMOGLOBIN: 10.5 g/dL — AB (ref 12.0–15.0)
POTASSIUM: 4.9 mmol/L (ref 3.5–5.1)
SODIUM: 147 mmol/L — AB (ref 135–145)

## 2014-12-03 LAB — GLUCOSE, CAPILLARY
GLUCOSE-CAPILLARY: 184 mg/dL — AB (ref 70–99)
Glucose-Capillary: 208 mg/dL — ABNORMAL HIGH (ref 70–99)

## 2014-12-03 SURGERY — ARTERIOVENOUS (AV) FISTULA CREATION
Anesthesia: Monitor Anesthesia Care | Site: Arm Upper | Laterality: Left

## 2014-12-03 MED ORDER — LIDOCAINE HCL (CARDIAC) 20 MG/ML IV SOLN
INTRAVENOUS | Status: AC
  Filled 2014-12-03: qty 5

## 2014-12-03 MED ORDER — OXYCODONE HCL 5 MG PO TABS: 5.0000 mg | ORAL_TABLET | Freq: Four times a day (QID) | ORAL | Status: DC | PRN

## 2014-12-03 MED ORDER — LIDOCAINE HCL (PF) 1 % IJ SOLN
INTRAMUSCULAR | Status: AC
  Filled 2014-12-03: qty 30

## 2014-12-03 MED ORDER — 0.9 % SODIUM CHLORIDE (POUR BTL) OPTIME
TOPICAL | Status: DC | PRN
  Administered 2014-12-03: 1000 mL

## 2014-12-03 MED ORDER — FENTANYL CITRATE 0.05 MG/ML IJ SOLN
INTRAMUSCULAR | Status: AC
  Filled 2014-12-03: qty 5

## 2014-12-03 MED ORDER — LIDOCAINE-EPINEPHRINE (PF) 1 %-1:200000 IJ SOLN
INTRAMUSCULAR | Status: DC | PRN
  Administered 2014-12-03: 30 mL

## 2014-12-03 MED ORDER — PROPOFOL INFUSION 10 MG/ML OPTIME
INTRAVENOUS | Status: DC | PRN
  Administered 2014-12-03: 25 ug/kg/min via INTRAVENOUS

## 2014-12-03 MED ORDER — SUCCINYLCHOLINE CHLORIDE 20 MG/ML IJ SOLN
INTRAMUSCULAR | Status: AC
  Filled 2014-12-03: qty 1

## 2014-12-03 MED ORDER — PROTAMINE SULFATE 10 MG/ML IV SOLN
INTRAVENOUS | Status: DC | PRN
  Administered 2014-12-03 (×2): 10 mg via INTRAVENOUS
  Administered 2014-12-03: 20 mg via INTRAVENOUS

## 2014-12-03 MED ORDER — HEPARIN SODIUM (PORCINE) 1000 UNIT/ML IJ SOLN
INTRAMUSCULAR | Status: DC | PRN
  Administered 2014-12-03: 7000 [IU] via INTRAVENOUS

## 2014-12-03 MED ORDER — HEPARIN SODIUM (PORCINE) 5000 UNIT/ML IJ SOLN
INTRAMUSCULAR | Status: DC | PRN
  Administered 2014-12-03: 11:00:00

## 2014-12-03 MED ORDER — SODIUM CHLORIDE 0.9 % IJ SOLN
INTRAMUSCULAR | Status: AC
  Filled 2014-12-03: qty 20

## 2014-12-03 MED ORDER — MIDAZOLAM HCL 2 MG/2ML IJ SOLN
INTRAMUSCULAR | Status: AC
  Filled 2014-12-03: qty 2

## 2014-12-03 MED ORDER — PROPOFOL 10 MG/ML IV BOLUS
INTRAVENOUS | Status: AC
  Filled 2014-12-03: qty 20

## 2014-12-03 MED ORDER — LIDOCAINE HCL (CARDIAC) 20 MG/ML IV SOLN
INTRAVENOUS | Status: DC | PRN
  Administered 2014-12-03: 40 mg via INTRAVENOUS

## 2014-12-03 MED ORDER — THROMBIN 20000 UNITS EX SOLR
CUTANEOUS | Status: AC
  Filled 2014-12-03: qty 20000

## 2014-12-03 MED ORDER — EPHEDRINE SULFATE 50 MG/ML IJ SOLN
INTRAMUSCULAR | Status: AC
  Filled 2014-12-03: qty 2

## 2014-12-03 MED ORDER — ARTIFICIAL TEARS OP OINT
TOPICAL_OINTMENT | OPHTHALMIC | Status: AC
  Filled 2014-12-03: qty 3.5

## 2014-12-03 MED ORDER — LIDOCAINE-EPINEPHRINE (PF) 1 %-1:200000 IJ SOLN
INTRAMUSCULAR | Status: AC
  Filled 2014-12-03: qty 10

## 2014-12-03 MED ORDER — PROTAMINE SULFATE 10 MG/ML IV SOLN
INTRAVENOUS | Status: AC
  Filled 2014-12-03: qty 10

## 2014-12-03 MED ORDER — SODIUM CHLORIDE 0.9 % IV SOLN: INTRAVENOUS | Status: DC

## 2014-12-03 MED ORDER — FENTANYL CITRATE 0.05 MG/ML IJ SOLN
INTRAMUSCULAR | Status: DC | PRN
  Administered 2014-12-03: 50 ug via INTRAVENOUS

## 2014-12-03 MED ORDER — HEPARIN SODIUM (PORCINE) 1000 UNIT/ML IJ SOLN
INTRAMUSCULAR | Status: AC
  Filled 2014-12-03: qty 2

## 2014-12-03 MED ORDER — MIDAZOLAM HCL 2 MG/2ML IJ SOLN
INTRAMUSCULAR | Status: DC | PRN
  Administered 2014-12-03: 2 mg via INTRAVENOUS

## 2014-12-03 SURGICAL SUPPLY — 37 items
ARMBAND PINK RESTRICT EXTREMIT (MISCELLANEOUS) ×3 IMPLANT
CANISTER SUCTION 2500CC (MISCELLANEOUS) ×3 IMPLANT
CANNULA VESSEL 3MM 2 BLNT TIP (CANNULA) ×2 IMPLANT
CLIP TI MEDIUM 6 (CLIP) ×3 IMPLANT
CLIP TI WIDE RED SMALL 6 (CLIP) ×5 IMPLANT
COVER SURGICAL LIGHT HANDLE (MISCELLANEOUS) ×3 IMPLANT
ELECT REM PT RETURN 9FT ADLT (ELECTROSURGICAL) ×3
ELECTRODE REM PT RTRN 9FT ADLT (ELECTROSURGICAL) ×1 IMPLANT
GLOVE BIO SURGEON STRL SZ 6.5 (GLOVE) ×1 IMPLANT
GLOVE BIO SURGEON STRL SZ7.5 (GLOVE) ×5 IMPLANT
GLOVE BIO SURGEONS STRL SZ 6.5 (GLOVE) ×1
GLOVE BIOGEL PI IND STRL 6.5 (GLOVE) IMPLANT
GLOVE BIOGEL PI IND STRL 7.0 (GLOVE) IMPLANT
GLOVE BIOGEL PI IND STRL 8 (GLOVE) ×1 IMPLANT
GLOVE BIOGEL PI INDICATOR 6.5 (GLOVE) ×2
GLOVE BIOGEL PI INDICATOR 7.0 (GLOVE) ×4
GLOVE BIOGEL PI INDICATOR 8 (GLOVE) ×6
GLOVE ECLIPSE 6.5 STRL STRAW (GLOVE) ×4 IMPLANT
GOWN STRL REUS W/ TWL LRG LVL3 (GOWN DISPOSABLE) ×3 IMPLANT
GOWN STRL REUS W/ TWL XL LVL3 (GOWN DISPOSABLE) IMPLANT
GOWN STRL REUS W/TWL LRG LVL3 (GOWN DISPOSABLE) ×9
GOWN STRL REUS W/TWL XL LVL3 (GOWN DISPOSABLE) ×6
KIT BASIN OR (CUSTOM PROCEDURE TRAY) ×3 IMPLANT
KIT ROOM TURNOVER OR (KITS) ×3 IMPLANT
LIQUID BAND (GAUZE/BANDAGES/DRESSINGS) ×3 IMPLANT
NS IRRIG 1000ML POUR BTL (IV SOLUTION) ×3 IMPLANT
PACK CV ACCESS (CUSTOM PROCEDURE TRAY) ×3 IMPLANT
PAD ARMBOARD 7.5X6 YLW CONV (MISCELLANEOUS) ×6 IMPLANT
SPONGE SURGIFOAM ABS GEL 100 (HEMOSTASIS) IMPLANT
SUT PROLENE 6 0 BV (SUTURE) ×3 IMPLANT
SUT SILK 3 0 (SUTURE) ×3
SUT SILK 3-0 18XBRD TIE 12 (SUTURE) IMPLANT
SUT VIC AB 3-0 SH 27 (SUTURE) ×3
SUT VIC AB 3-0 SH 27X BRD (SUTURE) ×1 IMPLANT
SUT VICRYL 4-0 PS2 18IN ABS (SUTURE) ×3 IMPLANT
UNDERPAD 30X30 INCONTINENT (UNDERPADS AND DIAPERS) ×3 IMPLANT
WATER STERILE IRR 1000ML POUR (IV SOLUTION) ×3 IMPLANT

## 2014-12-03 NOTE — H&P (View-Only) (Signed)
Patient ID: Jalessa Renna, female   DOB: [birth year], 64 y.o.   MRN: [id number]  Reason for Consult: Evaluate for hemodialysis access   Referred by Dr Corliss Parish  Subjective:     HPI:  Jeronica Hires is a 64 y.o. female who has stage IV chronic kidney disease. She is not yet on dialysis. However she is approaching the need for dialysis and was sent for evaluation for hemodialysis access. She denies any recent uremic symptoms. Specifically she denies nausea, vomiting, fatigue, anorexia, or palpitations. She is right-handed.  I have reviewed her records from [facility]. She has chronic kidney disease secondary to long-standing and poorly controlled diabetes and hypertension. On 10/06/2014, her GFR was 19. In addition to her renal insufficiency, she has a history of morbid obesity, acute on chronic congestive heart failure, and anemia of chronic disease.  Past Medical History  Diagnosis Date  . ST elevation myocardial infarction (STEMI) of inferior wall  03/2009    Ostial/proximal RCA occlusion treated with BMS stent  . CAD (coronary artery disease), native coronary artery      status post PCI to the RCA for inferior STEMI  . Cancer     endo ca  . Hypertension associated with diabetes   . Hyperlipidemia LDL goal <70   . Diabetes mellitus, type II, insulin dependent     With complications - CAD, CVA, peripheral ulcer ,  . Stroke/cerebrovascular accident  4/30/ 2014    Right-sided weakness, mostly with balance issues and only mild weakness.  . Diabetic peripheral neuropathy associated with type 2 diabetes mellitus   . Obesity, Class III, BMI 40-49.9 (morbid obesity)     BMI 43; 5' 2',  235 pounds 6.4 ounces  . CKD (chronic kidney disease), stage IV     Recent acute on chronic exacerbation in July 2014  . PAD (peripheral artery disease)   . Diabetic foot ulcer   . Dry gangrene     Left second toe; now on Sole of L foot  . Anemia   . CHF (congestive heart  failure)    Family History  Problem Relation Age of Onset  . Alcoholism Mother     Died from complications  . Alcoholism Father     Died from complications  . Cancer Father     Throat  . Pneumonia Father   . Diabetes Father   . Hypertension Father   . Heart disease      Unknown, unclear. Patient is not a good historian.  Essentially no pertinent history known   Past Surgical History  Procedure Laterality Date  . Abdominal hysterectomy    . Leg tendon surgery      patient fell in a store right leg  . Leg surgery      hole in bone( during Childhood)  . Cardiac catheterization  03/31/2009    Proximal RCA thrombotic occlusion (inferior STEMI) other coronaries but in a codominant system  . Coronary angioplasty  03/31/2009    PTCA , bare-metal stent 3.5 mm x 24 mm ostium of RCA ,EF 55%  . Amputation Left 08/07/2013    Procedure: left midfoot amputation;  Surgeon: Marianna Payment, MD;  Location: [facility];  Service: Orthopedics;  Laterality: Left;  . Amputation Left 08/09/2013    Procedure: AMPUTATION BELOW KNEE;  Surgeon: Marianna Payment, MD;  Location: [facility];  Service: Orthopedics;  Laterality: Left;    Short Social History:  History  Substance Use Topics  . Smoking status:  Former Smoker    Quit date: 04/29/1995  . Smokeless tobacco: Never Used  . Alcohol Use: No    No Known Allergies  Current Outpatient Prescriptions  Medication Sig Dispense Refill  . aspirin 81 MG tablet Take 81 mg by mouth daily.    Marland Kitchen atorvastatin (LIPITOR) 80 MG tablet Take 1 tablet (80 mg total) by mouth daily at 6 PM. 30 tablet 1  . buPROPion (WELLBUTRIN XL) 150 MG 24 hr tablet Take 1 tablet by mouth daily.    . carvedilol (COREG) 25 MG tablet Take 1 tablet (25 mg total) by mouth 2 (two) times daily with a meal. 60 tablet 1  . ferrous sulfate 325 (65 FE) MG tablet Take 325 mg by mouth daily with breakfast.    . furosemide (LASIX) 40 MG tablet Take 40 mg by mouth 2 (two) times daily.     Marland Kitchen  glucose blood (ACCU-CHEK AVIVA PLUS) test strip USE ONE STRIP TO CHECK GLUCOSE BEFORE MEALS AND AT BEDTIME    . hydrALAZINE (APRESOLINE) 50 MG tablet Take 1 tablet (50 mg total) by mouth every 8 (eight) hours. 90 tablet 1  . insulin aspart (NOVOLOG) 100 UNIT/ML injection Inject 0-15 Units into the skin 3 (three) times daily with meals. Sliding scale insulin CBG 70 - 120: 0 units: CBG 121 - 150: 2 units; CBG 151 - 200: 3 units; CBG 201 - 250: 5 units; CBG 251 - 300: 8 units;CBG 301 - 350: 11 units; CBG 351 - 400: 15 units; CBG > 400 : 15 units and notify your doctor 1 vial 12  . insulin glargine (LANTUS) 100 UNIT/ML injection Inject 0.45 mLs (45 Units total) into the skin at bedtime. 10 mL 11  . Insulin Pen Needle 31G X 8 MM MISC USE TO ADMINISTER INSULIN VIA LANTUS AND NOVOLOG 4 TIMES DAILY    . Lancets (FREESTYLE) lancets Use as directed to check blood sugar 3 times daily    . nystatin-triamcinolone ointment (MYCOLOG) Apply 1 application topically 2 (two) times daily.    . Omega-3 Fatty Acids (FISH OIL) 1000 MG CAPS Take 1 capsule by mouth 2 (two) times daily.    . pregabalin (LYRICA) 75 MG capsule Take 75 mg by mouth 2 (two) times daily.    Marland Kitchen amLODipine (NORVASC) 5 MG tablet Take 5 mg by mouth daily.    Marland Kitchen aspirin 325 MG tablet Take 325 mg by mouth every morning.     . OXYGEN Inhale 2.5 L into the lungs.     No current facility-administered medications for this visit.    Review of Systems  Constitutional: Negative for chills and fever.  Eyes: Positive for loss of vision.   Respiratory: Negative for cough and wheezing.  Cardiovascular: Negative for chest pain, chest tightness, claudication, dyspnea with exertion, orthopnea and palpitations.  GI: Negative for blood in stool and vomiting.  GU: Negative for dysuria and hematuria.  Musculoskeletal: Negative for leg pain, joint pain and myalgias.  Skin: Positive for rash. Negative for wound.  Neurological: Negative for dizziness and speech  difficulty.  Hematologic: Negative for bruises/bleeds easily. Psychiatric: Negative for depressed mood.        Objective:  Objective  Filed Vitals:   11/17/14 1223  BP: 191/72  Pulse: 68  Resp: 16  Height: 5\' 2"  (1.575 m)  Weight: 258 lb (117.028 kg)  SpO2: 100%   Body mass index is 47.18 kg/(m^2).  Physical Exam  Constitutional: She is oriented to person, place, and time. She  appears well-developed and well-nourished.  HENT:  Head: Normocephalic and atraumatic.  Neck: Neck supple. No JVD present. No thyromegaly present.  Cardiovascular: Normal rate, regular rhythm, normal heart sounds and normal pulses.  Exam reveals no friction rub.   No murmur heard. Pulses:      Radial pulses are 2+ on the right side, and 2+ on the left side.  I do not detect carotid bruits.  Pulmonary/Chest: Breath sounds normal. She has no wheezes. She has no rales.  Abdominal: Soft. Bowel sounds are normal. There is no tenderness.  Musculoskeletal: Normal range of motion. She exhibits no edema.  Lymphadenopathy:    She has no cervical adenopathy.  Neurological: She is alert and oriented to person, place, and time. She has normal strength. No sensory deficit.  Skin: No lesion and no rash noted.  Psychiatric: She has a normal mood and affect.    Data: I have independently interpreted her arterial Doppler study. She has a triphasic waveform in the radial and ulnar positions bilaterally.  I have independently interpreted her vein mapping. On the left side, the forearm cephalic vein looks marginal in size. The upper arm cephalic vein looks reasonable in size. The basilic vein looks reasonable in size in the left although it empties into the brachial system early.  On the right side, the forearm cephalic vein looks marginal in size. The upper arm cephalic vein on the right looks to be potentially adequate. The basilic vein on the right appears to be adequate in size.      Assessment/Plan:      Chronic kidney disease (CKD), stage IV (severe) Based on her vein mapping, she appears to be a candidate for a fistula in the left arm. Most likely this would be a left brachiocephalic fistula.I have explained the indications for placement of an AV fistula or AV graft. I've explained that if at all possible we will place an AV fistula.  I have reviewed the risks of placement of an AV fistula including but not limited to: failure of the fistula to mature, need for subsequent interventions, and thrombosis. In addition I have reviewed the potential complications of placement of an AV graft.  These risks include, but are not limited to, graft thrombosis, graft infection, wound healing problems, bleeding, arm swelling, and steal syndrome. All the patient's questions were answered and they are agreeable to proceed with surgery. Her surgery has been scheduled for 12/03/2014.    Angelia Mould MD [facility]

## 2014-12-03 NOTE — Transfer of Care (Signed)
Immediate Anesthesia Transfer of Care Note  Patient: [name]  Procedure(s) Performed: Procedure(s): ARTERIOVENOUS (AV) FISTULA CREATION (Left)  Patient Location: PACU  Anesthesia Type:MAC  Level of Consciousness: awake, alert  and oriented  Airway & Oxygen Therapy: Patient Spontanous Breathing and Patient connected to nasal cannula oxygen  Post-op Assessment: Report given to PACU RN and Post -op Vital signs reviewed and stable  Post vital signs: Reviewed and stable  Complications: No apparent anesthesia complications

## 2014-12-03 NOTE — Discharge Instructions (Signed)
° ° °  12/03/2014 Navia Yanke [id number] 1951/05/18  Surgeon(s): Angelia Mould, MD  Procedure(s): ARTERIOVENOUS (AV) FISTULA CREATION   Do not stick fistula for 12 weeks

## 2014-12-03 NOTE — Anesthesia Postprocedure Evaluation (Signed)
  Anesthesia Post-op Note  Patient: [name]  Procedure(s) Performed: Procedure(s): ARTERIOVENOUS (AV) FISTULA CREATION (Left)  Patient Location: PACU  Anesthesia Type:General  Level of Consciousness: awake  Airway and Oxygen Therapy: Patient Spontanous Breathing  Post-op Pain: mild  Post-op Assessment: Post-op Vital signs reviewed  Post-op Vital Signs: Reviewed  Last Vitals:  Filed Vitals:   12/03/14 1253  BP: 147/68  Pulse: 64  Temp:   Resp: 16    Complications: No apparent anesthesia complications

## 2014-12-03 NOTE — Interval H&P Note (Signed)
History and Physical Interval Note:  12/03/2014 9:55 AM  [name]  has presented today for surgery, with the diagnosis of Stage IV Chronic Kidney Disease N18.4  The various methods of treatment have been discussed with the patient and family. After consideration of risks, benefits and other options for treatment, the patient has consented to  Procedure(s): ARTERIOVENOUS (AV) FISTULA CREATION (Left) as a surgical intervention .  The patient's history has been reviewed, patient examined, no change in status, stable for surgery.  I have reviewed the patient's chart and labs.  Questions were answered to the patient's satisfaction.     Taiga Lupinacci S

## 2014-12-03 NOTE — Anesthesia Preprocedure Evaluation (Signed)
Anesthesia Evaluation  Patient identified by MRN, date of birth, ID band Patient awake    Reviewed: Allergy & Precautions, NPO status , Patient's Chart, lab work & pertinent test results  Airway Mallampati: II       Dental   Pulmonary former smoker,          Cardiovascular hypertension, + CAD, + Past MI, + Peripheral Vascular Disease and +CHF     Neuro/Psych  Headaches,  Neuromuscular disease CVA    GI/Hepatic   Endo/Other  diabetes  Renal/GU CRFRenal disease     Musculoskeletal   Abdominal   Peds  Hematology   Anesthesia Other Findings   Reproductive/Obstetrics                             Anesthesia Physical Anesthesia Plan  ASA: III  Anesthesia Plan: MAC   Post-op Pain Management:    Induction: Intravenous  Airway Management Planned: Simple Face Mask  Additional Equipment:   Intra-op Plan:   Post-operative Plan:   Informed Consent: I have reviewed the patients History and Physical, chart, labs and discussed the procedure including the risks, benefits and alternatives for the proposed anesthesia with the patient or authorized representative who has indicated his/her understanding and acceptance.     Plan Discussed with: CRNA, Anesthesiologist and Surgeon  Anesthesia Plan Comments:         Anesthesia Quick Evaluation

## 2014-12-05 NOTE — Op Note (Signed)
    NAME: [name]    MRN: [id number] DOB: [birth year]    DATE OF OPERATION: 12/05/2014  PREOP DIAGNOSIS: Chronic kidney disease  POSTOP DIAGNOSIS: Same  PROCEDURE: Left brachial cephalic AV fistula  SURGEON: Judeth Cornfield. Scot Dock, MD, FACS  ASSIST: PA   EBL: minimal  INDICATIONS: [name] is a 64 y.o. female who presents for new access.  TECHNIQUE: The patient was taken to the operating room and sedated by anesthesia. The left upper extremity was prepped and draped in usual sterile fashion. After the skin was infiltrated with 1% lidocaine, a transverse incision was made at the antecubital level. Here the cephalic vein was dissected free and ligated distally. It irrigated up nicely with heparinized saline. The brachial artery was dissected free beneath the fascia. The patient was heparinized. The brachial artery was clamped proximal and distally and a longitudinal arteriotomy was made. The vein was mobilized over and sewn end-to-side to the artery using continuous 6-0 Prolene suture. At the completion was a good thrill in the fistula and a good radial and ulnar signal with the Doppler. Hemostasis was obtained in the wound. The heparin was partially reversed with protamine. The wound was closed with the plan of 3-0 Vicryl and skin closed with 4-0 Vicryl. Dermabond was applied. The patient tolerated the procedure well and was transferred to the recovery room in stable condition. All needle and sponge counts were correct.  Deitra Mayo, MD, FACS [facility]  DATE OF DICTATION:   12/05/2014

## 2014-12-06 ENCOUNTER — Telehealth: Payer: Self-pay | Admitting: Vascular Surgery

## 2014-12-06 ENCOUNTER — Encounter (HOSPITAL_COMMUNITY): Payer: Self-pay | Admitting: Vascular Surgery

## 2014-12-06 NOTE — Telephone Encounter (Addendum)
-----   Message from Mena Goes, RN sent at 12/03/2014  1:29 PM EST ----- Regarding: schedule   ----- Message -----    From: Gabriel Earing, PA-C    Sent: 12/03/2014  11:50 AM      To: [facility] Charge Pool  S/p left BC AVF 12/03/14.  F/u with Dr. Scot Dock in 6 weeks with duplex.   Thanks, Samantha   12/06/14: lm for pt re appt, dpm

## 2014-12-07 ENCOUNTER — Other Ambulatory Visit: Payer: Self-pay | Admitting: Emergency Medicine

## 2014-12-07 ENCOUNTER — Other Ambulatory Visit: Payer: Self-pay | Admitting: Internal Medicine

## 2014-12-07 MED ORDER — ATORVASTATIN CALCIUM 80 MG PO TABS: 80.0000 mg | ORAL_TABLET | Freq: Every day | ORAL | Status: DC

## 2014-12-24 ENCOUNTER — Ambulatory Visit: Payer: Medicare Other | Admitting: Cardiology

## 2014-12-27 ENCOUNTER — Ambulatory Visit: Payer: Medicare Other | Admitting: Cardiology

## 2015-01-10 ENCOUNTER — Encounter (HOSPITAL_COMMUNITY): Payer: Self-pay | Admitting: *Deleted

## 2015-01-10 ENCOUNTER — Emergency Department (HOSPITAL_COMMUNITY): Payer: Medicare Other

## 2015-01-10 ENCOUNTER — Emergency Department (HOSPITAL_COMMUNITY)
Admission: EM | Admit: 2015-01-10 | Discharge: 2015-01-11 | Disposition: A | Discharge status: Home / Self Care | Payer: Medicare Other | Attending: Emergency Medicine | Admitting: Emergency Medicine

## 2015-01-10 DIAGNOSIS — I120 Hypertensive chronic kidney disease with stage 5 chronic kidney disease or end stage renal disease: Secondary | ICD-10-CM | POA: Diagnosis not present

## 2015-01-10 DIAGNOSIS — D649 Anemia, unspecified: Secondary | ICD-10-CM | POA: Diagnosis not present

## 2015-01-10 DIAGNOSIS — N186 End stage renal disease: Secondary | ICD-10-CM | POA: Insufficient documentation

## 2015-01-10 DIAGNOSIS — Z79899 Other long term (current) drug therapy: Secondary | ICD-10-CM | POA: Insufficient documentation

## 2015-01-10 DIAGNOSIS — Z9889 Other specified postprocedural states: Secondary | ICD-10-CM | POA: Diagnosis not present

## 2015-01-10 DIAGNOSIS — Z89512 Acquired absence of left leg below knee: Secondary | ICD-10-CM | POA: Insufficient documentation

## 2015-01-10 DIAGNOSIS — N179 Acute kidney failure, unspecified: Secondary | ICD-10-CM

## 2015-01-10 DIAGNOSIS — I252 Old myocardial infarction: Secondary | ICD-10-CM | POA: Insufficient documentation

## 2015-01-10 DIAGNOSIS — Z7982 Long term (current) use of aspirin: Secondary | ICD-10-CM | POA: Insufficient documentation

## 2015-01-10 DIAGNOSIS — Z992 Dependence on renal dialysis: Secondary | ICD-10-CM | POA: Diagnosis not present

## 2015-01-10 DIAGNOSIS — E785 Hyperlipidemia, unspecified: Secondary | ICD-10-CM | POA: Insufficient documentation

## 2015-01-10 DIAGNOSIS — I2510 Atherosclerotic heart disease of native coronary artery without angina pectoris: Secondary | ICD-10-CM | POA: Insufficient documentation

## 2015-01-10 DIAGNOSIS — E1140 Type 2 diabetes mellitus with diabetic neuropathy, unspecified: Secondary | ICD-10-CM | POA: Diagnosis not present

## 2015-01-10 DIAGNOSIS — I509 Heart failure, unspecified: Secondary | ICD-10-CM | POA: Insufficient documentation

## 2015-01-10 DIAGNOSIS — Z8542 Personal history of malignant neoplasm of other parts of uterus: Secondary | ICD-10-CM | POA: Diagnosis not present

## 2015-01-10 DIAGNOSIS — Z87891 Personal history of nicotine dependence: Secondary | ICD-10-CM | POA: Insufficient documentation

## 2015-01-10 DIAGNOSIS — E119 Type 2 diabetes mellitus without complications: Secondary | ICD-10-CM | POA: Diagnosis not present

## 2015-01-10 DIAGNOSIS — R0981 Nasal congestion: Secondary | ICD-10-CM | POA: Insufficient documentation

## 2015-01-10 DIAGNOSIS — Z9861 Coronary angioplasty status: Secondary | ICD-10-CM | POA: Insufficient documentation

## 2015-01-10 DIAGNOSIS — Z8673 Personal history of transient ischemic attack (TIA), and cerebral infarction without residual deficits: Secondary | ICD-10-CM | POA: Insufficient documentation

## 2015-01-10 DIAGNOSIS — R0602 Shortness of breath: Secondary | ICD-10-CM | POA: Diagnosis present

## 2015-01-10 DIAGNOSIS — E1169 Type 2 diabetes mellitus with other specified complication: Secondary | ICD-10-CM | POA: Insufficient documentation

## 2015-01-10 DIAGNOSIS — Z794 Long term (current) use of insulin: Secondary | ICD-10-CM | POA: Diagnosis not present

## 2015-01-10 DIAGNOSIS — N189 Chronic kidney disease, unspecified: Secondary | ICD-10-CM

## 2015-01-10 DIAGNOSIS — R011 Cardiac murmur, unspecified: Secondary | ICD-10-CM | POA: Insufficient documentation

## 2015-01-10 LAB — CBC WITH DIFFERENTIAL/PLATELET
Basophils Absolute: 0 10*3/uL (ref 0.0–0.1)
Basophils Relative: 0 % (ref 0–1)
EOS ABS: 0.2 10*3/uL (ref 0.0–0.7)
Eosinophils Relative: 4 % (ref 0–5)
HCT: 29.4 % — ABNORMAL LOW (ref 36.0–46.0)
HEMOGLOBIN: 9.2 g/dL — AB (ref 12.0–15.0)
LYMPHS PCT: 23 % (ref 12–46)
Lymphs Abs: 1.5 10*3/uL (ref 0.7–4.0)
MCH: 27.8 pg (ref 26.0–34.0)
MCHC: 31.3 g/dL (ref 30.0–36.0)
MCV: 88.8 fL (ref 78.0–100.0)
Monocytes Absolute: 0.6 10*3/uL (ref 0.1–1.0)
Monocytes Relative: 8 % (ref 3–12)
NEUTROS ABS: 4.3 10*3/uL (ref 1.7–7.7)
Neutrophils Relative %: 65 % (ref 43–77)
Platelets: 168 10*3/uL (ref 150–400)
RBC: 3.31 MIL/uL — ABNORMAL LOW (ref 3.87–5.11)
RDW: 16.7 % — ABNORMAL HIGH (ref 11.5–15.5)
WBC: 6.6 10*3/uL (ref 4.0–10.5)

## 2015-01-10 LAB — COMPREHENSIVE METABOLIC PANEL
ALK PHOS: 113 U/L (ref 39–117)
ALT: 13 U/L (ref 0–35)
ANION GAP: 10 (ref 5–15)
AST: 13 U/L (ref 0–37)
Albumin: 3 g/dL — ABNORMAL LOW (ref 3.5–5.2)
BUN: 122 mg/dL — ABNORMAL HIGH (ref 6–23)
CALCIUM: 9 mg/dL (ref 8.4–10.5)
CHLORIDE: 111 mmol/L (ref 96–112)
CO2: 20 mmol/L (ref 19–32)
Creatinine, Ser: 3.74 mg/dL — ABNORMAL HIGH (ref 0.50–1.10)
GFR calc Af Amer: 14 mL/min — ABNORMAL LOW (ref >90)
GFR, EST NON AFRICAN AMERICAN: 12 mL/min — AB (ref >90)
Glucose, Bld: 223 mg/dL — ABNORMAL HIGH (ref 70–99)
Potassium: 4.3 mmol/L (ref 3.5–5.1)
Sodium: 141 mmol/L (ref 135–145)
TOTAL PROTEIN: 7.1 g/dL (ref 6.0–8.3)
Total Bilirubin: 0.7 mg/dL (ref 0.3–1.2)

## 2015-01-10 LAB — I-STAT TROPONIN, ED: TROPONIN I, POC: 0.01 ng/mL (ref 0.00–0.08)

## 2015-01-10 LAB — BRAIN NATRIURETIC PEPTIDE: B NATRIURETIC PEPTIDE 5: 397.1 pg/mL — AB (ref 0.0–100.0)

## 2015-01-10 MED ORDER — FUROSEMIDE 10 MG/ML IJ SOLN
60.0000 mg | Freq: Once | INTRAMUSCULAR | Status: AC
  Administered 2015-01-10: 60 mg via INTRAVENOUS
  Filled 2015-01-10: qty 6

## 2015-01-10 NOTE — ED Notes (Signed)
Pt c/o sob x2 days with hx of chf, pt also reports a non productive cough, denies fevers, vomiting, diarrhea. Pt alert, oriented.

## 2015-01-10 NOTE — ED Notes (Addendum)
Pt reports cold symptoms for a week. Denies fevers at home. Pt's family member states the pt c/o shortness of breath while lying down. Pt's family called 1 because they were concerned about the pt's hx of CHF

## 2015-01-10 NOTE — ED Notes (Signed)
Per EMS: pt coming from home with c/o shortness of breath, nasal congestion. Per the family pt is short of breath, pt denies shortness of breath, family reports swelling around her shunt that was placed last week, pt denies swelling. Lung sounds clear, 100% on RA, no apparent distress noted upon EMS arrival.

## 2015-01-10 NOTE — ED Provider Notes (Signed)
CSN: [id number]     Arrival date & time 01/10/15  1707 History   First MD Initiated Contact with Patient 01/10/15 2146     Chief Complaint  Patient presents with  . URI     (Consider location/radiation/quality/duration/timing/severity/associated sxs/prior Treatment) HPI [name] is a 64 y.o. female with history of coronary artery disease, CVA, hypertension, diabetes, chronic kidney disease, left BKA, CHF, presents to emergency department complaining of shortness of breath, leg swelling, upper respiratory tract symptoms. Patient states that her symptoms started several days ago. According to her daughter, family noticed that her leg and her left stump was more swollen than normal. Patient also noticed today that she is having hard time laying down flat, states she is very short of breath. She is also complaining of some nasal congestion, sneezing, cough that is nonproductive and dry. This has been gone for several days. Daughter states she brought her here because she was concerned about heart failure related issues. Patient denies any chest pain. No fever or chills. No other complaints.  Past Medical History  Diagnosis Date  . ST elevation myocardial infarction (STEMI) of inferior wall  03/2009    Ostial/proximal RCA occlusion treated with BMS stent  . CAD (coronary artery disease), native coronary artery      status post PCI to the RCA for inferior STEMI  . Cancer     endo ca  . Hypertension associated with diabetes   . Hyperlipidemia LDL goal <70   . Diabetes mellitus, type II, insulin dependent     With complications - CAD, CVA, peripheral ulcer ,  . Stroke/cerebrovascular accident  4/30/ 2014    Right-sided weakness, mostly with balance issues and only mild weakness.  . Diabetic peripheral neuropathy associated with type 2 diabetes mellitus   . Obesity, Class III, BMI 40-49.9 (morbid obesity)     BMI 43; 5' 2',  235 pounds 6.4 ounces  . CKD (chronic kidney disease), stage IV      Recent acute on chronic exacerbation in July 2014  . PAD (peripheral artery disease)   . Diabetic foot ulcer   . Dry gangrene     Left second toe; now on Sole of L foot  . Anemia   . CHF (congestive heart failure)    Past Surgical History  Procedure Laterality Date  . Abdominal hysterectomy    . Leg tendon surgery      patient fell in a store right leg  . Leg surgery      hole in bone( during Childhood)  . Cardiac catheterization  03/31/2009    Proximal RCA thrombotic occlusion (inferior STEMI) other coronaries but in a codominant system  . Coronary angioplasty  03/31/2009    PTCA , bare-metal stent 3.5 mm x 24 mm ostium of RCA ,EF 55%  . Amputation Left 08/07/2013    Procedure: left midfoot amputation;  Surgeon: Marianna Payment, MD;  Location: [facility];  Service: Orthopedics;  Laterality: Left;  . Amputation Left 08/09/2013    Procedure: AMPUTATION BELOW KNEE;  Surgeon: Marianna Payment, MD;  Location: [facility];  Service: Orthopedics;  Laterality: Left;  . Av fistula placement Left 12/03/2014    Procedure: ARTERIOVENOUS (AV) FISTULA CREATION;  Surgeon: Angelia Mould, MD;  Location: [facility];  Service: Vascular;  Laterality: Left;   Family History  Problem Relation Age of Onset  . Alcoholism Mother     Died from complications  . Alcoholism Father     Died from complications  .  Cancer Father     Throat  . Pneumonia Father   . Diabetes Father   . Hypertension Father   . Heart disease      Unknown, unclear. Patient is not a good historian.  Essentially no pertinent history known   History  Substance Use Topics  . Smoking status: Former Smoker    Quit date: 04/29/1995  . Smokeless tobacco: Never Used  . Alcohol Use: No   OB History    No data available     Review of Systems  Constitutional: Negative for fever and chills.  HENT: Positive for congestion. Negative for ear pain, sore throat and voice change.   Respiratory: Positive for shortness of breath. Negative  for cough, chest tightness, wheezing and stridor.   Cardiovascular: Positive for leg swelling. Negative for chest pain and palpitations.  Gastrointestinal: Negative for nausea, vomiting, abdominal pain and diarrhea.  Genitourinary: Negative for dysuria, flank pain and pelvic pain.  Musculoskeletal: Negative for myalgias, arthralgias, neck pain and neck stiffness.  Skin: Negative for rash.  Neurological: Negative for dizziness, weakness and headaches.  All other systems reviewed and are negative.     Allergies  Review of patient's allergies indicates no known allergies.  Home Medications   Prior to Admission medications   Medication Sig Start Date End Date Taking? Authorizing Provider  aspirin 81 MG tablet Take 81 mg by mouth daily.    Historical Provider, MD  atorvastatin (LIPITOR) 80 MG tablet Take 1 tablet (80 mg total) by mouth daily at 6 PM. 12/07/14   Thurnell Lose, MD  buPROPion (WELLBUTRIN XL) 150 MG 24 hr tablet Take 1 tablet by mouth daily. 08/30/14 08/30/15  Historical Provider, MD  carvedilol (COREG) 25 MG tablet Take 1 tablet (25 mg total) by mouth 2 (two) times daily with a meal. 06/27/14   Theodis Blaze, MD  ferrous sulfate 325 (65 FE) MG tablet Take 325 mg by mouth daily with breakfast.    Historical Provider, MD  furosemide (LASIX) 40 MG tablet Take 40-80 mg by mouth 2 (two) times daily. 80 mg in the morning and 40 mg in the evening 08/10/14   Verlee Monte, MD  glucose blood (ACCU-CHEK AVIVA PLUS) test strip USE ONE STRIP TO CHECK GLUCOSE BEFORE MEALS AND AT BEDTIME 06/18/14   Historical Provider, MD  hydrALAZINE (APRESOLINE) 50 MG tablet Take 1 tablet (50 mg total) by mouth every 8 (eight) hours. 06/27/14   Theodis Blaze, MD  insulin aspart (NOVOLOG) 100 UNIT/ML injection Inject 0-15 Units into the skin 3 (three) times daily with meals. Sliding scale insulin CBG 70 - 120: 0 units: CBG 121 - 150: 2 units; CBG 151 - 200: 3 units; CBG 201 - 250: 5 units; CBG 251 - 300: 8  units;CBG 301 - 350: 11 units; CBG 351 - 400: 15 units; CBG > 400 : 15 units and notify your doctor 06/09/13   Ripudeep Krystal Eaton, MD  insulin glargine (LANTUS) 100 UNIT/ML injection Inject 0.45 mLs (45 Units total) into the skin at bedtime. 09/15/14   Shanker Kristeen Mans, MD  Insulin Pen Needle 31G X 8 MM MISC USE TO ADMINISTER INSULIN VIA LANTUS AND NOVOLOG 4 TIMES DAILY 08/30/14   Historical Provider, MD  Lancets (FREESTYLE) lancets Use as directed to check blood sugar 3 times daily 08/30/14 08/30/15  Historical Provider, MD  nystatin-triamcinolone ointment (MYCOLOG) Apply 1 application topically 2 (two) times daily as needed.     Historical Provider, MD  Omega-3 Fatty  Acids (FISH OIL) 1000 MG CAPS Take 1 capsule by mouth 2 (two) times daily.    Historical Provider, MD  oxyCODONE (ROXICODONE) 5 MG immediate release tablet Take 1 tablet (5 mg total) by mouth every 6 (six) hours as needed for moderate pain. 12/03/14   Samantha J Rhyne, PA-C  OXYGEN Inhale 2.5 L into the lungs as needed.     Historical Provider, MD  pregabalin (LYRICA) 75 MG capsule Take 75 mg by mouth 2 (two) times daily.    Historical Provider, MD   BP 141/59 mmHg  Pulse 68  Temp(Src) 98.4 F (36.9 C) (Oral)  Resp 18  SpO2 93% Physical Exam  Constitutional: She appears well-developed and well-nourished. No distress.  HENT:  Head: Normocephalic.  Right Ear: External ear normal.  Left Ear: External ear normal.  Nose: Nose normal.  Eyes: Conjunctivae and EOM are normal. Pupils are equal, round, and reactive to light.  Neck: Neck supple.  Cardiovascular: Normal rate and regular rhythm.   Murmur heard. Pulmonary/Chest: Effort normal and breath sounds normal. No respiratory distress. She has no wheezes. She has no rales.  Abdominal: Soft. Bowel sounds are normal. She exhibits no distension. There is no tenderness. There is no rebound.  Musculoskeletal: She exhibits edema.  Left BKA. Right LE 2+ pitting edema  Neurological: She is  alert.  Skin: Skin is warm and dry.  Psychiatric: She has a normal mood and affect. Her behavior is normal.  Nursing note and vitals reviewed.   ED Course  Procedures (including critical care time) Labs Review Labs Reviewed  CBC WITH DIFFERENTIAL/PLATELET - Abnormal; Notable for the following:    RBC 3.31 (*)    Hemoglobin 9.2 (*)    HCT 29.4 (*)    RDW 16.7 (*)    All other components within normal limits  COMPREHENSIVE METABOLIC PANEL - Abnormal; Notable for the following:    Glucose, Bld 223 (*)    BUN 122 (*)    Creatinine, Ser 3.74 (*)    Albumin 3.0 (*)    GFR calc non Af Amer 12 (*)    GFR calc Af Amer 14 (*)    All other components within normal limits  BRAIN NATRIURETIC PEPTIDE - Abnormal; Notable for the following:    B Natriuretic Peptide 397.1 (*)    All other components within normal limits  I-STAT TROPOININ, ED    Imaging Review Dg Chest 2 View  01/10/2015   CLINICAL DATA:  Shortness of breath and nasal congestion. Swelling around the shunt which was placed last week. History of coronary artery disease, stroke, BC, diabetes, CHF. Nonsmoker.  EXAM: CHEST  2 VIEW  COMPARISON:  08/06/2014  FINDINGS: Borderline heart size and pulmonary vascularity are probably normal for technique and are improved since prior study. No focal airspace disease or consolidation. No blunting of costophrenic angles. No pneumothorax. Calcification of the aorta. Degenerative changes in the spine.  IMPRESSION: Borderline heart size and pulmonary vascularity demonstrate improvement since prior study and is probably normal for technique. No definite evidence of active pulmonary disease.   Electronically Signed   By: Lucienne Capers M.D.   On: 01/10/2015 18:08     EKG Interpretation   Date/Time:  Monday January 10 2015 22:13:50 EST Ventricular Rate:  69 PR Interval:  178 QRS Duration: 86 QT Interval:  426 QTC Calculation: 456 R Axis:   16 Text Interpretation:  Sinus rhythm LAE, consider  biatrial enlargement Low  voltage, precordial leads Borderline T wave abnormalities  agree. no STEMI.  no change from old. Confirmed by Johnney Killian, MD, Jeannie Done [phone]) on 01/11/2015  1:02:12 AM      MDM   Final diagnoses:  Renal failure, acute on chronic  CHF exacerbation   Pt with LE swelling, orthopnea, URI symptoms. Will get labs, including BNP. ECG. CXR. Will monitor.  Lasix 60mg  IV ordered.   12:55 AM Pt's labs show worsening renal function with BUN doubling in the last few months. Creatinine up by 1 from baseline. CXR with no significant findings. BNP 397. Trop neg and ECG unchanged. Daughter states that although legs are more swollen than usual, pt's SOB is not worse than baseline. I discussed pt with Dr. Johnney Killian who has seen her as well. Spoke with Nephrology, Dr. Justin Mend, who recommended increasing pts lasix dose to 160mg  twice a day for now, and their office will contact pt tomorrow or wed for a follow up apt. He did not think pt needed admission at this time. Pt and her daughter are comfortable going home. She will follow up with cardiology as well. Return precautions discussed.   Filed Vitals:   01/10/15 2302 01/10/15 2315 01/11/15 0000 01/11/15 0045  BP: 140/61 155/60 121/86 135/73  Pulse:      Temp:      TempSrc:      Resp: 19 14 18    SpO2:          Renold Genta, PA-C 01/11/15 0111  Charlesetta Shanks, MD 01/12/15 [id number]

## 2015-01-11 NOTE — Discharge Instructions (Signed)
Increase  Your lasix to 160mg  twice a day. You will be contacted by your nephrologist in the next few days for a follow up appointment, if you do not hear from them, make sure to call them. Also call and follow up closely with your cardiologist. Return if worsening symptoms.     Chronic Kidney Disease Chronic kidney disease occurs when the kidneys are damaged over a long period. The kidneys are two organs that lie on either side of the spine between the middle of the back and the front of the abdomen. The kidneys:   Remove wastes and extra water from the blood.   Produce important hormones. These help keep bones strong, regulate blood pressure, and help create red blood cells.   Balance the fluids and chemicals in the blood and tissues. A small amount of kidney damage may not cause problems, but a large amount of damage may make it difficult or impossible for the kidneys to work the way they should. If steps are not taken to slow down the kidney damage or stop it from getting worse, the kidneys may stop working permanently. Most of the time, chronic kidney disease does not go away. However, it can often be controlled, and those with the disease can usually live normal lives. CAUSES  The most common causes of chronic kidney disease are diabetes and high blood pressure (hypertension). Chronic kidney disease may also be caused by:   Diseases that cause the kidneys' filters to become inflamed.   Diseases that affect the immune system.   Genetic diseases.   Medicines that damage the kidneys, such as anti-inflammatory medicines.  Poisoning or exposure to toxic substances.   A reoccurring kidney or urinary infection.   A problem with urine flow. This may be caused by:   Cancer.   Kidney stones.   An enlarged prostate in males. SIGNS AND SYMPTOMS  Because the kidney damage in chronic kidney disease occurs slowly, symptoms develop slowly and may not be obvious until the kidney  damage becomes severe. A person may have a kidney disease for years without showing any symptoms. Symptoms can include:   Swelling (edema) of the legs, ankles, or feet.   Tiredness (lethargy).   Nausea or vomiting.   Confusion.   Problems with urination, such as:   Decreased urine production.   Frequent urination, especially at night.   Frequent accidents in children who are potty trained.   Muscle twitches and cramps.   Shortness of breath.  Weakness.   Persistent itchiness.   Loss of appetite.  Metallic taste in the mouth.  Trouble sleeping.  Slowed development in children.  Short stature in children. DIAGNOSIS  Chronic kidney disease may be detected and diagnosed by tests, including blood, urine, imaging, or kidney biopsy tests.  TREATMENT  Most chronic kidney diseases cannot be cured. Treatment usually involves relieving symptoms and preventing or slowing the progression of the disease. Treatment may include:   A special diet. You may need to avoid alcohol and foods thatare salty and high in potassium.   Medicines. These may:   Lower blood pressure.   Relieve anemia.   Relieve swelling.   Protect the bones. HOME CARE INSTRUCTIONS   Follow your prescribed diet.   Take medicines only as directed by your health care provider. Do not take any new medicines (prescription, over-the-counter, or nutritional supplements) unless approved by your health care provider. Many medicines can worsen your kidney damage or need to have the dose adjusted.  Quit smoking if you smoke. Talk to your health care provider about a smoking cessation program.   Keep all follow-up visits as directed by your health care provider. SEEK IMMEDIATE MEDICAL CARE IF:  Your symptoms get worse or you develop new symptoms.   You develop symptoms of end-stage kidney disease. These include:   Headaches.   Abnormally dark or light skin.   Numbness in the hands  or feet.   Easy bruising.   Frequent hiccups.   Menstruation stops.   You have a fever.   You have decreased urine production.   You havepain or bleeding when urinating. MAKE SURE YOU:  Understand these instructions.  Will watch your condition.  Will get help right away if you are not doing well or get worse. FOR MORE INFORMATION   American Association of Kidney Patients: BombTimer.gl  National Kidney Foundation: www.kidney.[place]: https://mathis.com/  [facility]: www.lifeoptions.org and www.kidneyschool.org Document Released: 08/14/2008 Document Revised: 03/22/2014 Document Reviewed: 07/04/2012 [facility] Patient Information 2015 [place], [place]. This information is not intended to replace advice given to you by your health care provider. Make sure you discuss any questions you have with your health care provider.   Heart Failure Heart failure is a condition in which the heart has trouble pumping blood. This means your heart does not pump blood efficiently for your body to work well. In some cases of heart failure, fluid may back up into your lungs or you may have swelling (edema) in your lower legs. Heart failure is usually a long-term (chronic) condition. It is important for you to take good care of yourself and follow your health care provider's treatment plan. CAUSES  Some health conditions can cause heart failure. Those health conditions include:  High blood pressure (hypertension). Hypertension causes the heart muscle to work harder than normal. When pressure in the blood vessels is high, the heart needs to pump (contract) with more force in order to circulate blood throughout the body. High blood pressure eventually causes the heart to become stiff and weak.  Coronary artery disease (CAD). CAD is the buildup of cholesterol and fat (plaque) in the arteries of the heart. The blockage in the arteries deprives the heart muscle of  oxygen and blood. This can cause chest pain and may lead to a heart attack. High blood pressure can also contribute to CAD.  Heart attack (myocardial infarction). A heart attack occurs when one or more arteries in the heart become blocked. The loss of oxygen damages the muscle tissue of the heart. When this happens, part of the heart muscle dies. The injured tissue does not contract as well and weakens the heart's ability to pump blood.  Abnormal heart valves. When the heart valves do not open and close properly, it can cause heart failure. This makes the heart muscle pump harder to keep the blood flowing.  Heart muscle disease (cardiomyopathy or myocarditis). Heart muscle disease is damage to the heart muscle from a variety of causes. These can include drug or alcohol abuse, infections, or unknown reasons. These can increase the risk of heart failure.  Lung disease. Lung disease makes the heart work harder because the lungs do not work properly. This can cause a strain on the heart, leading it to fail.  Diabetes. Diabetes increases the risk of heart failure. High blood sugar contributes to high fat (lipid) levels in the blood. Diabetes can also cause slow damage to tiny blood vessels that carry important nutrients to the  heart muscle. When the heart does not get enough oxygen and food, it can cause the heart to become weak and stiff. This leads to a heart that does not contract efficiently.  Other conditions can contribute to heart failure. These include abnormal heart rhythms, thyroid problems, and low blood counts (anemia). Certain unhealthy behaviors can increase the risk of heart failure, including:  Being overweight.  Smoking or chewing tobacco.  Eating foods high in fat and cholesterol.  Abusing illicit drugs or alcohol.  Lacking physical activity. SYMPTOMS  Heart failure symptoms may vary and can be hard to detect. Symptoms may include:  Shortness of breath with activity, such as  climbing stairs.  Persistent cough.  Swelling of the feet, ankles, legs, or abdomen.  Unexplained weight gain.  Difficulty breathing when lying flat (orthopnea).  Waking from sleep because of the need to sit up and get more air.  Rapid heartbeat.  Fatigue and loss of energy.  Feeling light-headed, dizzy, or close to fainting.  Loss of appetite.  Nausea.  Increased urination during the night (nocturia). DIAGNOSIS  A diagnosis of heart failure is based on your history, symptoms, physical examination, and diagnostic tests. Diagnostic tests for heart failure may include:  Echocardiography.  Electrocardiography.  Chest X-ray.  Blood tests.  Exercise stress test.  Cardiac angiography.  Radionuclide scans. TREATMENT  Treatment is aimed at managing the symptoms of heart failure. Medicines, behavioral changes, or surgical intervention may be necessary to treat heart failure.  Medicines to help treat heart failure may include:  Angiotensin-converting enzyme (ACE) inhibitors. This type of medicine blocks the effects of a blood protein called angiotensin-converting enzyme. ACE inhibitors relax (dilate) the blood vessels and help lower blood pressure.  Angiotensin receptor blockers (ARBs). This type of medicine blocks the actions of a blood protein called angiotensin. Angiotensin receptor blockers dilate the blood vessels and help lower blood pressure.  Water pills (diuretics). Diuretics cause the kidneys to remove salt and water from the blood. The extra fluid is removed through urination. This loss of extra fluid lowers the volume of blood the heart pumps.  Beta blockers. These prevent the heart from beating too fast and improve heart muscle strength.  Digitalis. This increases the force of the heartbeat.  Healthy behavior changes include:  Obtaining and maintaining a healthy weight.  Stopping smoking or chewing tobacco.  Eating heart-healthy foods.  Limiting or  avoiding alcohol.  Stopping illicit drug use.  Physical activity as directed by your health care provider.  Surgical treatment for heart failure may include:  A procedure to open blocked arteries, repair damaged heart valves, or remove damaged heart muscle tissue.  A pacemaker to improve heart muscle function and control certain abnormal heart rhythms.  An internal cardioverter defibrillator to treat certain serious abnormal heart rhythms.  A left ventricular assist device (LVAD) to assist the pumping ability of the heart. HOME CARE INSTRUCTIONS   Take medicines only as directed by your health care provider. Medicines are important in reducing the workload of your heart, slowing the progression of heart failure, and improving your symptoms.  Do not stop taking your medicine unless directed by your health care provider.  Do not skip any dose of medicine.  Refill your prescriptions before you run out of medicine. Your medicines are needed every day.  Engage in moderate physical activity if directed by your health care provider. Moderate physical activity can benefit some people. The elderly and people with severe heart failure should consult with a health  care provider for physical activity recommendations.  Eat heart-healthy foods. Food choices should be free of trans fat and low in saturated fat, cholesterol, and salt (sodium). Healthy choices include fresh or frozen fruits and vegetables, fish, lean meats, legumes, fat-free or low-fat dairy products, and whole grain or high fiber foods. Talk to a dietitian to learn more about heart-healthy foods.  Limit sodium if directed by your health care provider. Sodium restriction may reduce symptoms of heart failure in some people. Talk to a dietitian to learn more about heart-healthy seasonings.  Use healthy cooking methods. Healthy cooking methods include roasting, grilling, broiling, baking, poaching, steaming, or stir-frying. Talk to a  dietitian to learn more about healthy cooking methods.  Limit fluids if directed by your health care provider. Fluid restriction may reduce symptoms of heart failure in some people.  Weigh yourself every day. Daily weights are important in the early recognition of excess fluid. You should weigh yourself every morning after you urinate and before you eat breakfast. Wear the same amount of clothing each time you weigh yourself. Record your daily weight. Provide your health care provider with your weight record.  Monitor and record your blood pressure if directed by your health care provider.  Check your pulse if directed by your health care provider.  Lose weight if directed by your health care provider. Weight loss may reduce symptoms of heart failure in some people.  Stop smoking or chewing tobacco. Nicotine makes your heart work harder by causing your blood vessels to constrict. Do not use nicotine gum or patches before talking to your health care provider.  Keep all follow-up visits as directed by your health care provider. This is important.  Limit alcohol intake to no more than 1 drink per day for nonpregnant women and 2 drinks per day for men. One drink equals 12 ounces of beer, 5 ounces of wine, or 1 ounces of hard liquor. Drinking more than that is harmful to your heart. Tell your health care provider if you drink alcohol several times a week. Talk with your health care provider about whether alcohol is safe for you. If your heart has already been damaged by alcohol or you have severe heart failure, drinking alcohol should be stopped completely.  Stop illicit drug use.  Stay up-to-date with immunizations. It is especially important to prevent respiratory infections through current pneumococcal and influenza immunizations.  Manage other health conditions such as hypertension, diabetes, thyroid disease, or abnormal heart rhythms as directed by your health care provider.  Learn to manage  stress.  Plan rest periods when fatigued.  Learn strategies to manage high temperatures. If the weather is extremely hot:  Avoid vigorous physical activity.  Use air conditioning or fans or seek a cooler location.  Avoid caffeine and alcohol.  Wear loose-fitting, lightweight, and light-colored clothing.  Learn strategies to manage cold temperatures. If the weather is extremely cold:  Avoid vigorous physical activity.  Layer clothes.  Wear mittens or gloves, a hat, and a scarf when going outside.  Avoid alcohol.  Obtain ongoing education and support as needed.  Participate in or seek rehabilitation as needed to maintain or improve independence and quality of life. SEEK MEDICAL CARE IF:   Your weight increases by 03 lb/1.4 kg in 1 day or 05 lb/2.3 kg in a week.  You have increasing shortness of breath that is unusual for you.  You are unable to participate in your usual physical activities.  You tire easily.  You cough  more than normal, especially with physical activity.  You have any or more swelling in areas such as your hands, feet, ankles, or abdomen.  You are unable to sleep because it is hard to breathe.  You feel like your heart is beating fast (palpitations).  You become dizzy or light-headed upon standing up. SEEK IMMEDIATE MEDICAL CARE IF:   You have difficulty breathing.  There is a change in mental status such as decreased alertness or difficulty with concentration.  You have a pain or discomfort in your chest.  You have an episode of fainting (syncope). MAKE SURE YOU:   Understand these instructions.  Will watch your condition.  Will get help right away if you are not doing well or get worse. Document Released: 11/05/2005 Document Revised: 03/22/2014 Document Reviewed: 12/05/2012 [facility] Patient Information 2015 [place], [place]. This information is not intended to replace advice given to you by your health care provider. Make sure you discuss  any questions you have with your health care provider.

## 2015-01-18 ENCOUNTER — Encounter: Payer: Self-pay | Admitting: Vascular Surgery

## 2015-01-19 ENCOUNTER — Encounter: Payer: Medicare Other | Admitting: Vascular Surgery

## 2015-01-19 ENCOUNTER — Other Ambulatory Visit (HOSPITAL_COMMUNITY): Payer: Medicare Other

## 2015-02-01 ENCOUNTER — Encounter: Payer: Self-pay | Admitting: Vascular Surgery

## 2015-02-02 ENCOUNTER — Other Ambulatory Visit (HOSPITAL_COMMUNITY): Payer: Medicare Other

## 2015-02-02 ENCOUNTER — Encounter: Payer: Medicare Other | Admitting: Vascular Surgery

## 2015-03-01 ENCOUNTER — Encounter: Payer: Self-pay | Admitting: Vascular Surgery

## 2015-03-02 ENCOUNTER — Encounter: Payer: Self-pay | Admitting: Vascular Surgery

## 2015-03-02 ENCOUNTER — Ambulatory Visit (INDEPENDENT_AMBULATORY_CARE_PROVIDER_SITE_OTHER): Payer: Self-pay | Admitting: Vascular Surgery

## 2015-03-02 ENCOUNTER — Ambulatory Visit (HOSPITAL_COMMUNITY)
Admission: RE | Admit: 2015-03-02 | Discharge: 2015-03-02 | Disposition: A | Discharge status: Home / Self Care | Payer: Medicare Other | Source: Ambulatory Visit | Attending: Vascular Surgery | Admitting: Vascular Surgery

## 2015-03-02 VITALS — BP 151/60 | HR 59 | Ht 62.0 in | Wt 258.0 lb

## 2015-03-02 DIAGNOSIS — N184 Chronic kidney disease, stage 4 (severe): Secondary | ICD-10-CM

## 2015-03-02 DIAGNOSIS — Z4931 Encounter for adequacy testing for hemodialysis: Secondary | ICD-10-CM | POA: Diagnosis not present

## 2015-03-02 DIAGNOSIS — N186 End stage renal disease: Secondary | ICD-10-CM | POA: Diagnosis not present

## 2015-03-02 NOTE — Progress Notes (Signed)
Patient name: [name] MRN: [id number] DOB: 09-06-1951 Sex: female  REASON FOR VISIT: follow up after left brachiocephalic AV fistula  HPI: [name] is a 64 y.o. female who underwent a left brachiocephalic AV fistula on [id number]. She comes in for a follow up visit. I had originally seen her in consultation on 11/17/2014. She had stage IV chronic kidney disease with a GFR of 19. Her preoperative vein mapping showed that the forearm cephalic vein was very small on the left. The upper arm cephalic vein looked reasonable. The basilic vein was reasonable although it emptied into the brachial system early. She had a similar situation in the right arm.  He has no specific complaints. She is not on dialysis.  Current Outpatient Prescriptions  Medication Sig Dispense Refill  . aspirin 81 MG tablet Take 81 mg by mouth daily.    Marland Kitchen atorvastatin (LIPITOR) 80 MG tablet Take 1 tablet (80 mg total) by mouth daily at 6 PM. (Patient taking differently: Take 40 mg by mouth daily at 6 PM. ) 30 tablet 2  . buPROPion (WELLBUTRIN XL) 150 MG 24 hr tablet Take 300 mg by mouth daily.     . carvedilol (COREG) 25 MG tablet Take 1 tablet (25 mg total) by mouth 2 (two) times daily with a meal. 60 tablet 1  . ferrous sulfate 325 (65 FE) MG tablet Take 325 mg by mouth daily with breakfast.    . furosemide (LASIX) 40 MG tablet Take 80 mg by mouth 2 (two) times daily.     Marland Kitchen glucose blood (ACCU-CHEK AVIVA PLUS) test strip USE ONE STRIP TO CHECK GLUCOSE BEFORE MEALS AND AT BEDTIME    . hydrALAZINE (APRESOLINE) 50 MG tablet Take 1 tablet (50 mg total) by mouth every 8 (eight) hours. (Patient taking differently: Take 25 mg by mouth every 12 (twelve) hours. ) 90 tablet 1  . insulin aspart (NOVOLOG) 100 UNIT/ML injection Inject 0-15 Units into the skin 3 (three) times daily with meals. Sliding scale insulin CBG 70 - 120: 0 units: CBG 121 - 150: 2 units; CBG 151 - 200: 3 units; CBG 201 - 250: 5 units; CBG 251 - 300: 8  units;CBG 301 - 350: 11 units; CBG 351 - 400: 15 units; CBG > 400 : 15 units and notify your doctor 1 vial 12  . insulin glargine (LANTUS) 100 UNIT/ML injection Inject 0.45 mLs (45 Units total) into the skin at bedtime. 10 mL 11  . Insulin Pen Needle 31G X 8 MM MISC USE TO ADMINISTER INSULIN VIA LANTUS AND NOVOLOG 4 TIMES DAILY    . Lancets (FREESTYLE) lancets Use as directed to check blood sugar 3 times daily    . nystatin-triamcinolone ointment (MYCOLOG) Apply 1 application topically 2 (two) times daily as needed (rash).     . Omega-3 Fatty Acids (FISH OIL) 1000 MG CAPS Take 1 capsule by mouth 2 (two) times daily.    Marland Kitchen oxyCODONE (ROXICODONE) 5 MG immediate release tablet Take 1 tablet (5 mg total) by mouth every 6 (six) hours as needed for moderate pain. 20 tablet 0  . OXYGEN Inhale 2.5 L into the lungs as needed.     . pregabalin (LYRICA) 75 MG capsule Take 75 mg by mouth 2 (two) times daily.     No current facility-administered medications for this visit.   REVIEW OF SYSTEMS: Valu.Nieves ] denotes positive finding; [  ] denotes negative finding  CARDIOVASCULAR:  [ ]  chest pain   [ ]  dyspnea  on exertion    CONSTITUTIONAL:  [ ]  fever   [ ]  chills  PHYSICAL EXAM: Filed Vitals:   03/02/15 1433 03/02/15 1435  BP: 143/57 151/60  Pulse: 58 59  Height: 5\' 2"  (1.575 m)   Weight: 258 lb (117.028 kg)   SpO2: 99%    GENERAL: The patient is a well-nourished female, in no acute distress. The vital signs are documented above. CARDIOVASCULAR: There is a regular rate and rhythm. PULMONARY: There is good air exchange bilaterally without wheezing or rales. Her fistula has an excellent thrill just above the antecubital level but then is more difficult to follow.  Duplex of her fistula shows several areas of increased velocity although the fistula is not especially pulsatile saw not sure that these are significant. However the diameters did not appear to be adequate and range from 0.32 cm-0.61 cm. The vein also  seems somewhat deep. The vein also has several large competing branches.  MEDICAL ISSUES:  STAGE IV CHRONIC KIDNEY DISEASE: Given that the vein is still somewhat small and deep in addition to having several large competing branches, I have recommended ideation of the competing branches and superficialization of the vein. However, the patient would not like to schedule surgery quite yet and would like to think about this some. I do not think this is unreasonable as she is not yet on dialysis and the fistula does have an excellent thrill, there may be some chance that this will enlarge and be adequate for access. However in the meantime I'll schedule her for a follow up duplex scan in 6 weeks. She knows to call sooner if she wishes to proceed with surgery.  [facility] Beeper: [phone]

## 2015-03-02 NOTE — Addendum Note (Signed)
Addended by: Mena Goes on: 03/02/2015 04:27 PM   Modules accepted: Orders

## 2015-03-06 ENCOUNTER — Emergency Department (HOSPITAL_COMMUNITY): Payer: Medicare Other

## 2015-03-06 ENCOUNTER — Emergency Department (HOSPITAL_COMMUNITY)
Admission: EM | Admit: 2015-03-06 | Discharge: 2015-03-07 | Disposition: A | Discharge status: Home / Self Care | Payer: Medicare Other | Attending: Emergency Medicine | Admitting: Emergency Medicine

## 2015-03-06 ENCOUNTER — Encounter (HOSPITAL_COMMUNITY): Payer: Self-pay | Admitting: *Deleted

## 2015-03-06 DIAGNOSIS — Z8542 Personal history of malignant neoplasm of other parts of uterus: Secondary | ICD-10-CM | POA: Diagnosis not present

## 2015-03-06 DIAGNOSIS — E669 Obesity, unspecified: Secondary | ICD-10-CM | POA: Insufficient documentation

## 2015-03-06 DIAGNOSIS — Z7982 Long term (current) use of aspirin: Secondary | ICD-10-CM | POA: Insufficient documentation

## 2015-03-06 DIAGNOSIS — N184 Chronic kidney disease, stage 4 (severe): Secondary | ICD-10-CM | POA: Diagnosis not present

## 2015-03-06 DIAGNOSIS — I129 Hypertensive chronic kidney disease with stage 1 through stage 4 chronic kidney disease, or unspecified chronic kidney disease: Secondary | ICD-10-CM | POA: Insufficient documentation

## 2015-03-06 DIAGNOSIS — I509 Heart failure, unspecified: Secondary | ICD-10-CM | POA: Insufficient documentation

## 2015-03-06 DIAGNOSIS — I2510 Atherosclerotic heart disease of native coronary artery without angina pectoris: Secondary | ICD-10-CM | POA: Diagnosis not present

## 2015-03-06 DIAGNOSIS — Z9861 Coronary angioplasty status: Secondary | ICD-10-CM | POA: Diagnosis not present

## 2015-03-06 DIAGNOSIS — Z794 Long term (current) use of insulin: Secondary | ICD-10-CM | POA: Diagnosis not present

## 2015-03-06 DIAGNOSIS — D649 Anemia, unspecified: Secondary | ICD-10-CM | POA: Insufficient documentation

## 2015-03-06 DIAGNOSIS — M25511 Pain in right shoulder: Secondary | ICD-10-CM | POA: Diagnosis present

## 2015-03-06 DIAGNOSIS — Z87891 Personal history of nicotine dependence: Secondary | ICD-10-CM | POA: Diagnosis not present

## 2015-03-06 DIAGNOSIS — E1140 Type 2 diabetes mellitus with diabetic neuropathy, unspecified: Secondary | ICD-10-CM | POA: Diagnosis not present

## 2015-03-06 DIAGNOSIS — Z9889 Other specified postprocedural states: Secondary | ICD-10-CM | POA: Diagnosis not present

## 2015-03-06 DIAGNOSIS — Z79899 Other long term (current) drug therapy: Secondary | ICD-10-CM | POA: Diagnosis not present

## 2015-03-06 DIAGNOSIS — I252 Old myocardial infarction: Secondary | ICD-10-CM | POA: Insufficient documentation

## 2015-03-06 LAB — CBC
HCT: 34.5 % — ABNORMAL LOW (ref 36.0–46.0)
Hemoglobin: 10.7 g/dL — ABNORMAL LOW (ref 12.0–15.0)
MCH: 27.4 pg (ref 26.0–34.0)
MCHC: 31 g/dL (ref 30.0–36.0)
MCV: 88.2 fL (ref 78.0–100.0)
Platelets: 104 10*3/uL — ABNORMAL LOW (ref 150–400)
RBC: 3.91 MIL/uL (ref 3.87–5.11)
RDW: 16.1 % — ABNORMAL HIGH (ref 11.5–15.5)
WBC: 6.3 10*3/uL (ref 4.0–10.5)

## 2015-03-06 LAB — COMPREHENSIVE METABOLIC PANEL
ALK PHOS: 86 U/L (ref 39–117)
ALT: 18 U/L (ref 0–35)
AST: 20 U/L (ref 0–37)
Albumin: 2.9 g/dL — ABNORMAL LOW (ref 3.5–5.2)
Anion gap: 12 (ref 5–15)
BUN: 122 mg/dL — ABNORMAL HIGH (ref 6–23)
CO2: 22 mmol/L (ref 19–32)
Calcium: 9.8 mg/dL (ref 8.4–10.5)
Chloride: 105 mmol/L (ref 96–112)
Creatinine, Ser: 4.71 mg/dL — ABNORMAL HIGH (ref 0.50–1.10)
GFR calc Af Amer: 10 mL/min — ABNORMAL LOW (ref >90)
GFR calc non Af Amer: 9 mL/min — ABNORMAL LOW (ref >90)
GLUCOSE: 203 mg/dL — AB (ref 70–99)
Potassium: 4.1 mmol/L (ref 3.5–5.1)
SODIUM: 139 mmol/L (ref 135–145)
Total Bilirubin: 0.5 mg/dL (ref 0.3–1.2)
Total Protein: 7.4 g/dL (ref 6.0–8.3)

## 2015-03-06 LAB — APTT: aPTT: 29 seconds (ref 24–37)

## 2015-03-06 LAB — DIFFERENTIAL
BASOS ABS: 0 10*3/uL (ref 0.0–0.1)
BASOS PCT: 0 % (ref 0–1)
EOS PCT: 6 % — AB (ref 0–5)
Eosinophils Absolute: 0.4 10*3/uL (ref 0.0–0.7)
Lymphocytes Relative: 31 % (ref 12–46)
Lymphs Abs: 2 10*3/uL (ref 0.7–4.0)
MONO ABS: 0.4 10*3/uL (ref 0.1–1.0)
Monocytes Relative: 7 % (ref 3–12)
NEUTROS PCT: 56 % (ref 43–77)
Neutro Abs: 3.6 10*3/uL (ref 1.7–7.7)

## 2015-03-06 LAB — PROTIME-INR
INR: 1.09 (ref 0.00–1.49)
PROTHROMBIN TIME: 14.3 s (ref 11.6–15.2)

## 2015-03-06 LAB — I-STAT TROPONIN, ED: Troponin i, poc: 0.01 ng/mL (ref 0.00–0.08)

## 2015-03-06 NOTE — ED Provider Notes (Signed)
CSN: [id number]     Arrival date & time 03/06/15  1523 History   First MD Initiated Contact with Patient 03/06/15 1644     Chief Complaint  Patient presents with  . arm twitching      (Consider location/radiation/quality/duration/timing/severity/associated sxs/prior Treatment) Patient is a 64 y.o. female presenting with extremity weakness. The history is provided by the patient. No language interpreter was used.  Extremity Weakness This is a new problem. The current episode started yesterday. The problem occurs constantly. The problem has been gradually worsening. Associated symptoms include arthralgias and weakness. Nothing aggravates the symptoms. She has tried nothing for the symptoms. The treatment provided no relief.  Pt reports she has soreness in her right shoulder.   Pt reports pain when she lifts shoulder.  Pt reports arm was twitchy.  Pt reports she had a previous stroke that caused her left arm to twitch.   Pt reports she does not feel like she has had a stroke.  Pt reports her family wanted her to get checked out. Pt has pain with lifting arm.    Past Medical History  Diagnosis Date  . ST elevation myocardial infarction (STEMI) of inferior wall  03/2009    Ostial/proximal RCA occlusion treated with BMS stent  . CAD (coronary artery disease), native coronary artery      status post PCI to the RCA for inferior STEMI  . Cancer     endo ca  . Hypertension associated with diabetes   . Hyperlipidemia LDL goal <70   . Diabetes mellitus, type II, insulin dependent     With complications - CAD, CVA, peripheral ulcer ,  . Stroke/cerebrovascular accident  4/30/ 2014    Right-sided weakness, mostly with balance issues and only mild weakness.  . Diabetic peripheral neuropathy associated with type 2 diabetes mellitus   . Obesity, Class III, BMI 40-49.9 (morbid obesity)     BMI 43; 5' 2',  235 pounds 6.4 ounces  . CKD (chronic kidney disease), stage IV     Recent acute on chronic  exacerbation in July 2014  . PAD (peripheral artery disease)   . Diabetic foot ulcer   . Dry gangrene     Left second toe; now on Sole of L foot  . Anemia   . CHF (congestive heart failure)    Past Surgical History  Procedure Laterality Date  . Abdominal hysterectomy    . Leg tendon surgery      patient fell in a store right leg  . Leg surgery      hole in bone( during Childhood)  . Cardiac catheterization  03/31/2009    Proximal RCA thrombotic occlusion (inferior STEMI) other coronaries but in a codominant system  . Coronary angioplasty  03/31/2009    PTCA , bare-metal stent 3.5 mm x 24 mm ostium of RCA ,EF 55%  . Amputation Left 08/07/2013    Procedure: left midfoot amputation;  Surgeon: Marianna Payment, MD;  Location: [facility];  Service: Orthopedics;  Laterality: Left;  . Amputation Left 08/09/2013    Procedure: AMPUTATION BELOW KNEE;  Surgeon: Marianna Payment, MD;  Location: [facility];  Service: Orthopedics;  Laterality: Left;  . Av fistula placement Left 12/03/2014    Procedure: ARTERIOVENOUS (AV) FISTULA CREATION;  Surgeon: Angelia Mould, MD;  Location: [facility];  Service: Vascular;  Laterality: Left;   Family History  Problem Relation Age of Onset  . Alcoholism Mother     Died from complications  . Alcoholism  Father     Died from complications  . Cancer Father     Throat  . Pneumonia Father   . Diabetes Father   . Hypertension Father   . Heart disease      Unknown, unclear. Patient is not a good historian.  Essentially no pertinent history known   History  Substance Use Topics  . Smoking status: Former Smoker    Quit date: 04/29/1995  . Smokeless tobacco: Never Used  . Alcohol Use: No   OB History    No data available     Review of Systems  Musculoskeletal: Positive for arthralgias and extremity weakness.  Neurological: Positive for weakness.  All other systems reviewed and are negative.     Allergies  Review of patient's allergies indicates no  known allergies.  Home Medications   Prior to Admission medications   Medication Sig Start Date End Date Taking? Authorizing Provider  buPROPion (WELLBUTRIN XL) 300 MG 24 hr tablet Take 300 mg by mouth daily. 11/23/14 11/23/15 Yes Historical Provider, MD  aspirin 81 MG tablet Take 81 mg by mouth daily.    Historical Provider, MD  atorvastatin (LIPITOR) 80 MG tablet Take 1 tablet (80 mg total) by mouth daily at 6 PM. Patient taking differently: Take 40 mg by mouth daily at 6 PM.  12/07/14   Thurnell Lose, MD  buPROPion (WELLBUTRIN XL) 150 MG 24 hr tablet Take 300 mg by mouth daily.  08/30/14 08/30/15  Historical Provider, MD  carvedilol (COREG) 25 MG tablet Take 1 tablet (25 mg total) by mouth 2 (two) times daily with a meal. 06/27/14   Theodis Blaze, MD  ferrous sulfate 325 (65 FE) MG tablet Take 325 mg by mouth daily with breakfast.    Historical Provider, MD  furosemide (LASIX) 40 MG tablet Take 80 mg by mouth 2 (two) times daily.  08/10/14   Verlee Monte, MD  glucose blood (ACCU-CHEK AVIVA PLUS) test strip USE ONE STRIP TO CHECK GLUCOSE BEFORE MEALS AND AT BEDTIME 06/18/14   Historical Provider, MD  hydrALAZINE (APRESOLINE) 50 MG tablet Take 1 tablet (50 mg total) by mouth every 8 (eight) hours. Patient taking differently: Take 25 mg by mouth every 12 (twelve) hours.  06/27/14   Theodis Blaze, MD  insulin aspart (NOVOLOG) 100 UNIT/ML injection Inject 0-15 Units into the skin 3 (three) times daily with meals. Sliding scale insulin CBG 70 - 120: 0 units: CBG 121 - 150: 2 units; CBG 151 - 200: 3 units; CBG 201 - 250: 5 units; CBG 251 - 300: 8 units;CBG 301 - 350: 11 units; CBG 351 - 400: 15 units; CBG > 400 : 15 units and notify your doctor 06/09/13   Ripudeep Krystal Eaton, MD  insulin glargine (LANTUS) 100 UNIT/ML injection Inject 0.45 mLs (45 Units total) into the skin at bedtime. 09/15/14   Shanker Kristeen Mans, MD  Insulin Pen Needle 31G X 8 MM MISC USE TO ADMINISTER INSULIN VIA LANTUS AND NOVOLOG 4 TIMES DAILY  08/30/14   Historical Provider, MD  Lancets (FREESTYLE) lancets Use as directed to check blood sugar 3 times daily 08/30/14 08/30/15  Historical Provider, MD  nystatin-triamcinolone ointment (MYCOLOG) Apply 1 application topically 2 (two) times daily as needed (rash).     Historical Provider, MD  Omega-3 Fatty Acids (FISH OIL) 1000 MG CAPS Take 1 capsule by mouth 2 (two) times daily.    Historical Provider, MD  oxyCODONE (ROXICODONE) 5 MG immediate release tablet Take 1 tablet (  5 mg total) by mouth every 6 (six) hours as needed for moderate pain. 12/03/14   Samantha J Rhyne, PA-C  OXYGEN Inhale 2.5 L into the lungs as needed.     Historical Provider, MD  pregabalin (LYRICA) 75 MG capsule Take 75 mg by mouth 2 (two) times daily.    Historical Provider, MD   BP 148/62 mmHg  Pulse 61  Temp(Src) 97.9 F (36.6 C) (Oral)  Resp 15  SpO2 100% Physical Exam  Constitutional: She is oriented to person, place, and time. She appears well-developed and well-nourished.  HENT:  Head: Normocephalic.  Right Ear: External ear normal.  Left Ear: External ear normal.  Eyes: EOM are normal. Pupils are equal, round, and reactive to light.  Neck: Normal range of motion.  Cardiovascular: Normal rate and normal heart sounds.   Pulmonary/Chest: Effort normal.  Abdominal: Soft. She exhibits no distension.  Musculoskeletal: She exhibits tenderness.  Tender right shoulder.  Decreased range of motion,  Pt reports pain with moving shoulder.    Neurological: She is alert and oriented to person, place, and time.  Skin: Skin is warm.  Psychiatric: She has a normal mood and affect.  Nursing note and vitals reviewed.   ED Course  Procedures (including critical care time) Labs Review Labs Reviewed  CBC - Abnormal; Notable for the following:    Hemoglobin 10.7 (*)    HCT 34.5 (*)    RDW 16.1 (*)    Platelets 104 (*)    All other components within normal limits  DIFFERENTIAL - Abnormal; Notable for the following:     Eosinophils Relative 6 (*)    All other components within normal limits  COMPREHENSIVE METABOLIC PANEL - Abnormal; Notable for the following:    Glucose, Bld 203 (*)    BUN 122 (*)    Creatinine, Ser 4.71 (*)    Albumin 2.9 (*)    GFR calc non Af Amer 9 (*)    GFR calc Af Amer 10 (*)    All other components within normal limits  PROTIME-INR  APTT  CBG MONITORING, ED  Randolm Idol, ED    Imaging Review Dg Shoulder Right  03/06/2015   CLINICAL DATA:  Right shoulder pain for 1 week with abduction. Initial encounter.  EXAM: RIGHT SHOULDER - 2+ VIEW  COMPARISON:  08/30/2010  FINDINGS: No acute fracture or dislocation is identified. Mild degenerative changes are again seen at the acromioclavicular joint. No lytic or blastic osseous lesion or focal soft tissue abnormality is identified.  IMPRESSION: No acute osseous abnormality identified. Mild AC joint degenerative change.   Electronically Signed   By: Logan Bores   On: 03/06/2015 18:04     EKG Interpretation   Date/Time:  Sunday March 06 2015 15:34:28 EDT Ventricular Rate:  62 PR Interval:  202 QRS Duration: 84 QT Interval:  426 QTC Calculation: 432 R Axis:   -5 Text Interpretation:  Normal sinus rhythm Nonspecific T wave abnormality  Abnormal ECG No significant change since last tracing Confirmed by [facility]   MD, MARTHA [phone]) on 03/06/2015 8:41:04 PM      MDM  Ct scan no evidence of new stroke.   Pt has had symptoms for over 2 days and symptoms seem muscular.  Bun and creatine in line with previous labs.     Final diagnoses:  Shoulder pain, right    Tylenol for pain. See your Physicain for recheck in 2-3 days for rechecki    Fransico Meadow, PA-C 03/06/15  [place], PA-C 03/06/15 [place], PA-C 03/06/15 2306  Alfonzo Beers, MD 03/06/15 [phone]

## 2015-03-06 NOTE — Discharge Instructions (Signed)
Arthritis, Nonspecific °Arthritis is inflammation of a joint. This usually means pain, redness, warmth or swelling are present. One or more joints may be involved. There are a number of types of arthritis. Your caregiver may not be able to tell what type of arthritis you have right away. °CAUSES  °The most common cause of arthritis is the wear and tear on the joint (osteoarthritis). This causes damage to the cartilage, which can break down over time. The knees, hips, back and neck are most often affected by this type of arthritis. °Other types of arthritis and common causes of joint pain include: °· Sprains and other injuries near the joint. Sometimes minor sprains and injuries cause pain and swelling that develop hours later. °· Rheumatoid arthritis. This affects hands, feet and knees. It usually affects both sides of your body at the same time. It is often associated with chronic ailments, fever, weight loss and general weakness. °· Crystal arthritis. Gout and pseudo gout can cause occasional acute severe pain, redness and swelling in the foot, ankle, or knee. °· Infectious arthritis. Bacteria can get into a joint through a break in overlying skin. This can cause infection of the joint. Bacteria and viruses can also spread through the blood and affect your joints. °· Drug, infectious and allergy reactions. Sometimes joints can become mildly painful and slightly swollen with these types of illnesses. °SYMPTOMS  °· Pain is the main symptom. °· Your joint or joints can also be red, swollen and warm or hot to the touch. °· You may have a fever with certain types of arthritis, or even feel overall ill. °· The joint with arthritis will hurt with movement. Stiffness is present with some types of arthritis. °DIAGNOSIS  °Your caregiver will suspect arthritis based on your description of your symptoms and on your exam. Testing may be needed to find the type of arthritis: °· Blood and sometimes urine tests. °· X-ray tests  and sometimes CT or MRI scans. °· Removal of fluid from the joint (arthrocentesis) is done to check for bacteria, crystals or other causes. Your caregiver (or a specialist) will numb the area over the joint with a local anesthetic, and use a needle to remove joint fluid for examination. This procedure is only minimally uncomfortable. °· Even with these tests, your caregiver may not be able to tell what kind of arthritis you have. Consultation with a specialist (rheumatologist) may be helpful. °TREATMENT  °Your caregiver will discuss with you treatment specific to your type of arthritis. If the specific type cannot be determined, then the following general recommendations may apply. °Treatment of severe joint pain includes: °· Rest. °· Elevation. °· Anti-inflammatory medication (for example, ibuprofen) may be prescribed. Avoiding activities that cause increased pain. °· Only take over-the-counter or prescription medicines for pain and discomfort as recommended by your caregiver. °· Cold packs over an inflamed joint may be used for 10 to 15 minutes every hour. Hot packs sometimes feel better, but do not use overnight. Do not use hot packs if you are diabetic without your caregiver's permission. °· A cortisone shot into arthritic joints may help reduce pain and swelling. °· Any acute arthritis that gets worse over the next 1 to 2 days needs to be looked at to be sure there is no joint infection. °Long-term arthritis treatment involves modifying activities and lifestyle to reduce joint stress jarring. This can include weight loss. Also, exercise is needed to nourish the joint cartilage and remove waste. This helps keep the muscles   around the joint strong. °HOME CARE INSTRUCTIONS  °· Do not take aspirin to relieve pain if gout is suspected. This elevates uric acid levels. °· Only take over-the-counter or prescription medicines for pain, discomfort or fever as directed by your caregiver. °· Rest the joint as much as  possible. °· If your joint is swollen, keep it elevated. °· Use crutches if the painful joint is in your leg. °· Drinking plenty of fluids may help for certain types of arthritis. °· Follow your caregiver's dietary instructions. °· Try low-impact exercise such as: °¨ Swimming. °¨ Water aerobics. °¨ Biking. °¨ Walking. °· Morning stiffness is often relieved by a warm shower. °· Put your joints through regular range-of-motion. °SEEK MEDICAL CARE IF:  °· You do not feel better in 24 hours or are getting worse. °· You have side effects to medications, or are not getting better with treatment. °SEEK IMMEDIATE MEDICAL CARE IF:  °· You have a fever. °· You develop severe joint pain, swelling or redness. °· Many joints are involved and become painful and swollen. °· There is severe back pain and/or leg weakness. °· You have loss of bowel or bladder control. °Document Released: 12/13/2004 Document Revised: 01/28/2012 Document Reviewed: 12/29/2008 °[employer]® Patient Information ©2015 [employer]. This information is not intended to replace advice given to you by your health care provider. Make sure you discuss any questions you have with your health care provider. ° °

## 2015-03-06 NOTE — ED Notes (Signed)
The pt has had some rt arm twitching for 2 days hx of a previous stroke and usually her lt arm twitches.  The pt has renal failure not yet dialysis  Fistula  Lt arm.  Alert oriented

## 2015-03-07 NOTE — ED Notes (Signed)
Pt stable, denies any pain, family at bedside, states understanding of discharge instructions

## 2015-03-11 ENCOUNTER — Encounter: Payer: Self-pay | Admitting: Cardiology

## 2015-03-11 ENCOUNTER — Ambulatory Visit (INDEPENDENT_AMBULATORY_CARE_PROVIDER_SITE_OTHER): Payer: Medicare Other | Admitting: Cardiology

## 2015-03-11 DIAGNOSIS — I5032 Chronic diastolic (congestive) heart failure: Secondary | ICD-10-CM

## 2015-03-11 DIAGNOSIS — N184 Chronic kidney disease, stage 4 (severe): Secondary | ICD-10-CM

## 2015-03-11 DIAGNOSIS — E785 Hyperlipidemia, unspecified: Secondary | ICD-10-CM | POA: Diagnosis not present

## 2015-03-11 DIAGNOSIS — Z9861 Coronary angioplasty status: Secondary | ICD-10-CM

## 2015-03-11 DIAGNOSIS — I739 Peripheral vascular disease, unspecified: Secondary | ICD-10-CM

## 2015-03-11 DIAGNOSIS — I2510 Atherosclerotic heart disease of native coronary artery without angina pectoris: Secondary | ICD-10-CM

## 2015-03-11 DIAGNOSIS — Z79899 Other long term (current) drug therapy: Secondary | ICD-10-CM

## 2015-03-11 DIAGNOSIS — N189 Chronic kidney disease, unspecified: Secondary | ICD-10-CM

## 2015-03-11 DIAGNOSIS — E1122 Type 2 diabetes mellitus with diabetic chronic kidney disease: Secondary | ICD-10-CM

## 2015-03-11 DIAGNOSIS — I10 Essential (primary) hypertension: Secondary | ICD-10-CM

## 2015-03-11 DIAGNOSIS — I252 Old myocardial infarction: Secondary | ICD-10-CM

## 2015-03-11 DIAGNOSIS — Z89512 Acquired absence of left leg below knee: Secondary | ICD-10-CM

## 2015-03-11 NOTE — Patient Instructions (Signed)
Your physician recommends that you return for lab work FASTING (CMET & lipid)  Your physician has requested that you have a lower extremity arterial duplex. This test is an ultrasound of the arteries in the legs. It looks at arterial blood flow in the legs. Allow one hour for Lower Arterial scans. There are no restrictions or special instructions  Your physician wants you to follow-up in: 6 months with Dr. Ellyn Hack. You will receive a reminder letter in the mail two months in advance. If you don't receive a letter, please call our office to schedule the follow-up appointment.

## 2015-03-11 NOTE — Progress Notes (Signed)
PCP: Saralyn Pilar   Orthopedic Surgeon: Dr. Sharol Given Vascular Surgeon: Dr. Deitra Mayo Nephrology: Dr. Moshe Cipro is  Clinic Note: Chief Complaint  Patient presents with  . 6 month visit    ED on 4/17  . Coronary Artery Disease  . PAD  . Congestive Heart Failure    diastolic    HPI: [name] is a 64 y.o. female with a PMH below who presents today for follow ER visit and routine six-month follow up - last clinic visit was with Kerin Ransom PA-C. Her major issues are CAD status post inferior STEMI, chronic diastolic heart failure, PAD, Stage 4 CKD, Type 2 diabetes, and hypertension.   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 There was much confusion and complication of document her outside studies reviewed. Finally, we review Doppler studies (occluded L Popliteal Artery, ABI 0.74, R PTA occluded with 2 V runnoff).  Initially angiography was deferred due to renal dysfunction.  Her gangrene worsened and she underwent  left BKA.  From a Cardiac standpoint, she had an inferior STEMI in May 2010 and received an RCA BMS. Her EF has been preserved. She was admitted 06/23/14 with acute on chronic diastolic CHF. We saw her in consult then. Echo 06/23/14 showed an EF of 55-60% with grade 2 diastolic dysfunction. She was discharged 06/27/14 and then re admitted 08/06/14 for CHF. We did not see her during the second admission. She was seen by the renal service then. She was discharged 08/10/14.  Saw Kerin Ransom, [place] in Oct 2015.  In the interim, she has progressed to Stage IV CKD & has had a  L arm fistula placed.  ER visit 4/19 for R/O CVA. - R arm numb   Past Medical History  Diagnosis Date  . ST elevation myocardial infarction (STEMI) of inferior wall  03/2009   Ostial/proximal RCA occlusion treated with BMS stent  . CAD S/P percutaneous coronary angioplasty 5/110     status post PCI to the RCA for inferior STEMI  . Cancer     endo ca  . Hypertension associated with diabetes   . Hyperlipidemia LDL goal <70   . Diabetes mellitus, type II, insulin dependent     With complications - CAD, CVA, peripheral ulcer ,  . Stroke/cerebrovascular accident  4/30/ 2014    Right-sided weakness, mostly with balance issues and only mild weakness.  . Diabetic peripheral neuropathy associated with type 2 diabetes mellitus   . Obesity, Class III, BMI 40-49.9 (morbid obesity)     BMI 43; 5' 2',  235 pounds 6.4 ounces  . CKD (chronic kidney disease), stage IV     Recent acute on chronic exacerbation in July 2014  . PAD (peripheral artery disease)   . Diabetic foot ulcer   . Dry gangrene     Left second toe; now on Sole of L foot  . Anemia   . Chronic diastolic heart failure, NYHA class 2     Echocardiogram 06/2014:  Technically difficult study. Normal LV size with mild LVH. EF 55-60%. Moderate diastolic dysfunction, normal RV size and function. Likely aortic sclerosis without stenosis    Prior Cardiac Evaluation and Past Surgical History: Past Surgical History  Procedure Laterality Date  . Abdominal hysterectomy    .  Leg tendon surgery      patient fell in a store right leg  . Leg surgery      hole in bone( during Childhood)  . Cardiac catheterization  03/31/2009    Proximal RCA thrombotic occlusion (inferior STEMI) other coronaries but in a codominant system  . Percutaneous coronary stent intervention (pci-s)  03/31/2009    PTCA , bare-metal stent 3.5 mm x 24 mm ostium of RCA ,EF 55%  . Amputation Left 08/07/2013    Procedure: left midfoot amputation;  Surgeon: Marianna Payment, MD;  Location: [facility] ORS;  Service: Orthopedics;  Laterality: Left;  . Amputation Left 08/09/2013    Procedure: AMPUTATION BELOW KNEE;  Surgeon: Marianna Payment, MD;  Location: [facility]  ORS;  Service: Orthopedics;  Laterality: Left;  . Av fistula placement Left 12/03/2014    Procedure: ARTERIOVENOUS (AV) FISTULA CREATION;  Surgeon: Angelia Mould, MD;  Location: [facility] OR;  Service: Vascular;  Laterality: Left;  . Transthoracic echocardiogram  06/2014    Technically difficult study. Normal LV size with mild LVH. EF 55-60%. Moderate - grade 2  diastolic dysfunction, normal RV size and function. Likely aortic sclerosis without stenosis    Interval History: I have not seen her in over a year and a half. Since I last saw her, she is an admission for diastolic heart failure that may actually be more related to renal insufficiency. She has normal tone preserved EF with diastolic dysfunction. She is mostly wheelchair bound - can only do ~6 feet without & hip pain limiting her. "burning sensation in legs".  Transferring is hermost strenuous activity that she she does. Since the discharged in the fall 2015, her PND orthopnea and edema symptoms have improved.  Edema (R leg) - much better She is not very active and therefore is difficult to assess any true exertional symptoms. However with the amount of exertion she does, she denies any chest tightness or pressure with rest or exertion. Moderate exertion will probably make her dyspneic and at least part due to her deconditioning. But no resting dyspnea. Note PND or orthopnea. No rapid irregular heartbeat/palpitations. She can get orthostatic dizziness but no syncope/near syncope.  she had one episode of right arm numbness, but no other TIA or amaurosis fugax symptoms. Her evaluation in the ER was negative for stroke. She has not noted any new sores or lesions on her right foot or leg. She seems to be wearing her prosthesis, does not do much walking with it.  ROS: A comprehensive was performed. Review of Systems  Respiratory: Negative for cough and wheezing.   Cardiovascular: Positive for leg swelling (Mild on the right leg).    Gastrointestinal: Positive for heartburn. Negative for blood in stool and melena.  Genitourinary: Negative for dysuria and hematuria.       She still makes urine.  Musculoskeletal: Positive for back pain.  Neurological: Positive for dizziness (Positional).  Endo/Heme/Allergies: Does not bruise/bleed easily.  Psychiatric/Behavioral: Positive for memory loss.  All other systems reviewed and are negative.   Current Outpatient Prescriptions on File Prior to Visit  Medication Sig Dispense Refill  . aspirin 81 MG tablet Take 81 mg by mouth daily.    Marland Kitchen atorvastatin (LIPITOR) 80 MG tablet Take 1 tablet (80 mg total) by mouth daily at 6 PM. (Patient taking differently: Take 40 mg by mouth daily at 6 PM. ) 30 tablet 2  . buPROPion (WELLBUTRIN XL) 300 MG 24 hr tablet Take 300 mg by mouth daily.    Marland Kitchen  ferrous sulfate 325 (65 FE) MG tablet Take 325 mg by mouth daily with breakfast.    . furosemide (LASIX) 40 MG tablet Take 80 mg by mouth 2 (two) times daily.     Marland Kitchen glucose blood (ACCU-CHEK AVIVA PLUS) test strip USE ONE STRIP TO CHECK GLUCOSE BEFORE MEALS AND AT BEDTIME    . hydrALAZINE (APRESOLINE) 50 MG tablet Take 1 tablet (50 mg total) by mouth every 8 (eight) hours. (Patient taking differently: Take 25 mg by mouth every 12 (twelve) hours. ) 90 tablet 1  . insulin aspart (NOVOLOG) 100 UNIT/ML injection Inject 0-15 Units into the skin 3 (three) times daily with meals. Sliding scale insulin CBG 70 - 120: 0 units: CBG 121 - 150: 2 units; CBG 151 - 200: 3 units; CBG 201 - 250: 5 units; CBG 251 - 300: 8 units;CBG 301 - 350: 11 units; CBG 351 - 400: 15 units; CBG > 400 : 15 units and notify your doctor 1 vial 12  . insulin glargine (LANTUS) 100 UNIT/ML injection Inject 0.45 mLs (45 Units total) into the skin at bedtime. 10 mL 11  . Insulin Pen Needle 31G X 8 MM MISC USE TO ADMINISTER INSULIN VIA LANTUS AND NOVOLOG 4 TIMES DAILY    . Lancets (FREESTYLE) lancets Use as directed to check blood sugar 3 times  daily    . nystatin-triamcinolone ointment (MYCOLOG) Apply 1 application topically 2 (two) times daily as needed (rash).     . Omega-3 Fatty Acids (FISH OIL) 1000 MG CAPS Take 1 capsule by mouth 2 (two) times daily.    Marland Kitchen oxyCODONE (ROXICODONE) 5 MG immediate release tablet Take 1 tablet (5 mg total) by mouth every 6 (six) hours as needed for moderate pain. 20 tablet 0  . OXYGEN Inhale 2.5 L into the lungs as needed.     . pregabalin (LYRICA) 75 MG capsule Take 75 mg by mouth 2 (two) times daily.     No current facility-administered medications on file prior to visit.   No Known Allergies   History  Substance Use Topics  . Smoking status: Former Smoker    Quit date: 04/29/1995  . Smokeless tobacco: Never Used  . Alcohol Use: No   Family History  Problem Relation Age of Onset  . Alcoholism Mother     Died from complications  . Alcoholism Father     Died from complications  . Cancer Father     Throat  . Pneumonia Father   . Diabetes Father   . Hypertension Father   . Heart disease      Unknown, unclear. Patient is not a good historian.  Essentially no pertinent history known    Wt Readings from Last 3 Encounters:  03/11/15 258 lb 14.4 oz (117.436 kg)  03/02/15 258 lb (117.028 kg)  12/03/14 258 lb (117.028 kg)  267 pounds in October 2015 = 9 lb weight loss  PHYSICAL EXAM BP 126/68 mmHg  Pulse 64  Ht 5' 2" (1.575 m)  Wt 258 lb 14.4 oz (117.436 kg)  BMI 47.34 kg/m2 General appearance: alert, cooperative, appears stated age, no distress and morbidly obese ; well groomed. Poor story. HEENT: [place]/AT, EOMI, MMM, anicteric sclera Neck: no adenopathy, no carotid bruit and no JVD Lungs: clear to auscultation bilaterally, normal percussion bilaterally and non-labored Heart: regular rate and rhythm, S1& S2 normal, + S4, No M/R.  Very distant heart sounds due to body habitus Abdomen: soft, non-tender; bowel sounds normal; no masses,  unable to  palpate organomegaly. Extremities:  status post left BKA with prosthetic in place. Right leg has weak, but palpable DP pulse. 1-2+ edema.  Left brachiocephalic AV fistula with normal thrill. Neurologic: Mental status: Alert, oriented, thought content appropriate; Cranial nerves: normal (II-XII grossly intact) Psych: somewhat blunted mood and affect.   Adult ECG Report - not checked b/c EKG checked in ER last week.  Recent Labs: Ordered today  Lab Results  Component Value Date   CHOL 145 03/14/2015   HDL 31* 03/14/2015   LDLCALC 72 03/14/2015   LDLDIRECT 115* 08/31/2014   TRIG 209* 03/14/2015   CHOLHDL 4.7 03/14/2015    ASSESSMENT / PLAN: Problem List Items Addressed This Visit    CAD S/P PCI TO RCA for Inferior STEMI. BMS (Chronic)    No active anginal symptoms. She is on aspirin, statin, beta blocker and hydralazine. Not on ACE inhibitor. The secondary to chronic renal insufficiency.      Relevant Medications   carvedilol (COREG) 25 MG tablet   Chronic diastolic congestive heart failure, NYHA class 2 (Chronic)    No real major issue since her hospitalization. Her diuretic dosing has been monitored and adjusted by her nephrologist. She is on high-dose carvedilol with additional hydralazine for afterload reduction. She is not on nitrate or other antihypertensive agent.as of right now her blood pressure seems well controlled, therefore think we're okay without additional antihypertensive therapy.  Plan for now is to continue with current Lasix dosing, with sliding scale adjustments when necessary.  We also talked about the importance of low-salt diet.      Relevant Medications   carvedilol (COREG) 25 MG tablet   Chronic kidney disease (CKD), stage IV (severe) (Chronic)   Essential hypertension (Chronic)    Well-controlled on carvedilol and hydralazine.      Relevant Medications   carvedilol (COREG) 25 MG tablet   Hyperlipidemia LDL goal <70 (Chronic)    Is on high-dose statin. No recent labs have been checked.  We will order labs today. CMP and fasting lipid panel.labs from October 2015 showed an LDL of 115 which would be well above goal.  (This was the direct LDL).  If we are not at goal, would consider adding Zetia.      Relevant Medications   carvedilol (COREG) 25 MG tablet   Peripheral arterial disease: s/p L BKA; Occluded R Pop A, 2 V runnoff (Chronic)    She has not right lower extremity disease by Dopplers and a status post left BKA. She has not had any followup motion Dopplers since the time of her BKA. Her lower extremity disease is being followed by Dr. Gwenlyn Found.  We'll recheck lower extremity arterial Dopplers of the right leg to monitor for any progression of disease.she is not able to walk, so therefore to assess for any claudication symptoms. She is taking care to monitor her foot and leg for any potential worsening lesions.      Relevant Medications   carvedilol (COREG) 25 MG tablet   Other Relevant Orders   Lower Extremity Arterial Doppler Bilateral   S/P BKA (below knee amputation) unilateral (Chronic)   Severe obesity (BMI >= 40) (Chronic)    Unfortunately she is unable to walk. We talked about possibly trying to do some water aerobic exercises. Also importance of dietary modification.      Status post myocardial infarction of inferior wall (Chronic)    As far as I can tell she has not had any further atrial symptoms. She is a  bare-metal stent to the RCA. Preserved LVEF with no evidence of wall motion abnormality on echo. She is not had Myoview Stress Test since her MI, but in the absence of symptoms, I would be reluctant to reassess. I am fearful that she would have a False positive stress test.      Relevant Medications   carvedilol (COREG) 25 MG tablet   Type 2 diabetes mellitus with renal manifestations (Chronic)    Other Visit Diagnoses    Medication management        Relevant Orders    Comp Met (CMET) (Completed)    Hyperlipidemia        Relevant Medications     carvedilol (COREG) 25 MG tablet    Other Relevant Orders    Lipid panel (Completed)       Orders Placed This Encounter  Procedures  . Lipid panel  . Comp Met (CMET)  . Lower Extremity Arterial Doppler Bilateral    Standing Status: Future     Number of Occurrences: 1     Standing Expiration Date: 03/10/2016    Order Specific Question:  Laterality    Answer:  Bilateral    Order Specific Question:  Where should this test be performed:    Answer:  [facility]   Meds ordered this encounter  Medications  . carvedilol (COREG) 25 MG tablet    Sig: Take 25 mg by mouth.   This is a very difficult clinic visit. It's been over a year and a half since I seen the patient. There has been a significant amount of activity and change in her condition since I last saw her. I had to review several procedure reports and discharge summaries as well as followup notes. I personally reviewed her echocardiogram from August 2015 and importantthe results to the past medical history. She has multiplecardiac and cardiovascular issues that I am not sure how much they're being followed by other providers based on the amount of time since I last saw her. Overall, with charting and patient interaction over an hour was on this patient.  Followup: 6 months    Ashara Lounsbury, Leonie Green, M.D., M.S. Interventional Cardiologist   Pager # [phone]   ADDENDUM  LABS 03/14/15 Lab Results  Component Value Date   CHOL 145 03/14/2015   HDL 31* 03/14/2015   LDLCALC 72 03/14/2015   LDLDIRECT 115* 08/31/2014   TRIG 209* 03/14/2015   CHOLHDL 4.7 03/14/2015     Chemistry      Component Value Date/Time   NA 145 03/14/2015 1425   K 4.9 03/14/2015 1425   CL 106 03/14/2015 1425   CO2 24 03/14/2015 1425   BUN 95* 03/14/2015 1425   CREATININE 3.51* 03/14/2015 1425   CREATININE 4.71* 03/06/2015 1542      Component Value Date/Time   CALCIUM 9.7 03/14/2015 1425   ALKPHOS 84 03/14/2015 1425   AST 15 03/14/2015 1425    ALT 15 03/14/2015 1425   BILITOT 0.5 03/14/2015 1425

## 2015-03-14 ENCOUNTER — Ambulatory Visit (HOSPITAL_COMMUNITY)
Admission: RE | Admit: 2015-03-14 | Discharge: 2015-03-14 | Disposition: A | Discharge status: Home / Self Care | Payer: Medicare Other | Source: Ambulatory Visit | Attending: Cardiovascular Disease | Admitting: Cardiovascular Disease

## 2015-03-14 DIAGNOSIS — I739 Peripheral vascular disease, unspecified: Secondary | ICD-10-CM | POA: Diagnosis not present

## 2015-03-14 NOTE — Progress Notes (Signed)
Arterial Duplex Right Lower Ext. Completed. Divine Hansley, BS, RDMS, RVT  

## 2015-03-15 ENCOUNTER — Encounter: Payer: Self-pay | Admitting: Cardiology

## 2015-03-15 LAB — COMPREHENSIVE METABOLIC PANEL
ALBUMIN: 3.5 g/dL (ref 3.5–5.2)
ALT: 15 U/L (ref 0–35)
AST: 15 U/L (ref 0–37)
Alkaline Phosphatase: 84 U/L (ref 39–117)
BUN: 95 mg/dL — ABNORMAL HIGH (ref 6–23)
CALCIUM: 9.7 mg/dL (ref 8.4–10.5)
CHLORIDE: 106 meq/L (ref 96–112)
CO2: 24 meq/L (ref 19–32)
Creat: 3.51 mg/dL — ABNORMAL HIGH (ref 0.50–1.10)
Glucose, Bld: 213 mg/dL — ABNORMAL HIGH (ref 70–99)
POTASSIUM: 4.9 meq/L (ref 3.5–5.3)
Sodium: 145 mEq/L (ref 135–145)
TOTAL PROTEIN: 7.2 g/dL (ref 6.0–8.3)
Total Bilirubin: 0.5 mg/dL (ref 0.2–1.2)

## 2015-03-15 LAB — LIPID PANEL
Cholesterol: 145 mg/dL (ref 0–200)
HDL: 31 mg/dL — ABNORMAL LOW (ref >=46)
LDL Cholesterol: 72 mg/dL (ref 0–99)
Total CHOL/HDL Ratio: 4.7 Ratio
Triglycerides: 209 mg/dL — ABNORMAL HIGH (ref <150)
VLDL: 42 mg/dL — AB (ref 0–40)

## 2015-03-16 NOTE — Assessment & Plan Note (Signed)
Well-controlled on carvedilol and hydralazine.

## 2015-03-16 NOTE — Assessment & Plan Note (Signed)
She has not right lower extremity disease by Dopplers and a status post left BKA. She has not had any followup motion Dopplers since the time of her BKA. Her lower extremity disease is being followed by Dr. Gwenlyn Found.  We'll recheck lower extremity arterial Dopplers of the right leg to monitor for any progression of disease.she is not able to walk, so therefore to assess for any claudication symptoms. She is taking care to monitor her foot and leg for any potential worsening lesions.

## 2015-03-16 NOTE — Assessment & Plan Note (Signed)
As far as I can tell she has not had any further atrial symptoms. She is a bare-metal stent to the RCA. Preserved LVEF with no evidence of wall motion abnormality on echo. She is not had Myoview Stress Test since her MI, but in the absence of symptoms, I would be reluctant to reassess. I am fearful that she would have a False positive stress test.

## 2015-03-16 NOTE — Assessment & Plan Note (Addendum)
No real major issue since her hospitalization. Her diuretic dosing has been monitored and adjusted by her nephrologist. She is on high-dose carvedilol with additional hydralazine for afterload reduction. She is not on nitrate or other antihypertensive agent.as of right now her blood pressure seems well controlled, therefore think we're okay without additional antihypertensive therapy.  Plan for now is to continue with current Lasix dosing, with sliding scale adjustments when necessary.  We also talked about the importance of low-salt diet.

## 2015-03-16 NOTE — Assessment & Plan Note (Addendum)
Is on high-dose statin. No recent labs have been checked. We will order labs today. CMP and fasting lipid panel.labs from October 2015 showed an LDL of 115 which would be well above goal.  (This was the direct LDL).  If we are not at goal, would consider adding Zetia.

## 2015-03-16 NOTE — Assessment & Plan Note (Signed)
No active anginal symptoms. She is on aspirin, statin, beta blocker and hydralazine. Not on ACE inhibitor. The secondary to chronic renal insufficiency.

## 2015-03-16 NOTE — Assessment & Plan Note (Signed)
Unfortunately she is unable to walk. We talked about possibly trying to do some water aerobic exercises. Also importance of dietary modification.

## 2015-03-25 ENCOUNTER — Telehealth: Payer: Self-pay | Admitting: *Deleted

## 2015-03-25 NOTE — Telephone Encounter (Signed)
LEFT MESSAGE ON VOICEMAIL TO CALL BACK FOR RESULTS

## 2015-03-25 NOTE — Telephone Encounter (Signed)
-----   Message from Leonie Man, MD sent at 03/23/2015 11:48 PM EDT ----- Cholesterol levels look overall a lot better. Triglycerides are still high and that may be an indication of changes in diet with increased fatty starchy foods. Cut back on those as it is better for diabetes as well. Otherwise continue the atorvastatin dose is currently taking. LDL is almost at goal.  Leonie Man, MD

## 2015-03-25 NOTE — Telephone Encounter (Signed)
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 This is no change from last study.

## 2015-03-31 NOTE — Telephone Encounter (Signed)
Spoke to patient.  LEA DOPPLER AND LABS Result given . Verbalized understanding

## 2015-04-11 ENCOUNTER — Encounter: Payer: Self-pay | Admitting: Vascular Surgery

## 2015-04-12 ENCOUNTER — Encounter: Payer: Self-pay | Admitting: Vascular Surgery

## 2015-04-13 ENCOUNTER — Ambulatory Visit (HOSPITAL_COMMUNITY)
Admission: RE | Admit: 2015-04-13 | Discharge: 2015-04-13 | Disposition: A | Discharge status: Home / Self Care | Payer: Medicare Other | Source: Ambulatory Visit | Attending: Vascular Surgery | Admitting: Vascular Surgery

## 2015-04-13 ENCOUNTER — Encounter: Payer: Self-pay | Admitting: Vascular Surgery

## 2015-04-13 ENCOUNTER — Ambulatory Visit (INDEPENDENT_AMBULATORY_CARE_PROVIDER_SITE_OTHER): Payer: Medicare Other | Admitting: Vascular Surgery

## 2015-04-13 VITALS — BP 143/55 | HR 58 | Ht 62.0 in | Wt 258.0 lb

## 2015-04-13 DIAGNOSIS — N184 Chronic kidney disease, stage 4 (severe): Secondary | ICD-10-CM

## 2015-04-13 DIAGNOSIS — Z4931 Encounter for adequacy testing for hemodialysis: Secondary | ICD-10-CM | POA: Insufficient documentation

## 2015-04-13 NOTE — Progress Notes (Signed)
Patient name: [name] MRN: [id number] DOB: August 18, 1951 Sex: female  REASON FOR VISIT: Follow up after AV fistula placement  HPI: [name] is a 64 y.o. female who underwent a left brachiocephalic AV fistula on [id number]. I saw her in follow up on 03/02/2015. At that time, the vein was somewhat small and also noted to be deep. She had several large competing branches. I recommended ligation of the competing branches and superficialization of the vein. However she did not wish to proceed at that time. She comes in now for 6 week follow up visit.  She has no pain or paresthesias in the left upper extremity. She has no specific complaints.  Current Outpatient Prescriptions  Medication Sig Dispense Refill  . aspirin 81 MG tablet Take 81 mg by mouth daily.    Marland Kitchen atorvastatin (LIPITOR) 80 MG tablet Take 1 tablet (80 mg total) by mouth daily at 6 PM. (Patient taking differently: Take 40 mg by mouth daily at 6 PM. ) 30 tablet 2  . buPROPion (WELLBUTRIN XL) 300 MG 24 hr tablet Take 300 mg by mouth daily.    . carvedilol (COREG) 25 MG tablet Take 25 mg by mouth.    . ferrous sulfate 325 (65 FE) MG tablet Take 325 mg by mouth daily with breakfast.    . furosemide (LASIX) 40 MG tablet Take 80 mg by mouth 2 (two) times daily.     Marland Kitchen glucose blood (ACCU-CHEK AVIVA PLUS) test strip USE ONE STRIP TO CHECK GLUCOSE BEFORE MEALS AND AT BEDTIME    . hydrALAZINE (APRESOLINE) 50 MG tablet Take 1 tablet (50 mg total) by mouth every 8 (eight) hours. (Patient taking differently: Take 25 mg by mouth every 12 (twelve) hours. ) 90 tablet 1  . insulin aspart (NOVOLOG) 100 UNIT/ML injection Inject 0-15 Units into the skin 3 (three) times daily with meals. Sliding scale insulin CBG 70 - 120: 0 units: CBG 121 - 150: 2 units; CBG 151 - 200: 3 units; CBG 201 - 250: 5 units; CBG 251 - 300: 8 units;CBG 301 - 350: 11 units; CBG 351 - 400: 15 units; CBG > 400 : 15 units and notify your doctor 1 vial 12  . insulin glargine  (LANTUS) 100 UNIT/ML injection Inject 0.45 mLs (45 Units total) into the skin at bedtime. 10 mL 11  . Insulin Pen Needle 31G X 8 MM MISC USE TO ADMINISTER INSULIN VIA LANTUS AND NOVOLOG 4 TIMES DAILY    . Lancets (FREESTYLE) lancets Use as directed to check blood sugar 3 times daily    . nystatin-triamcinolone ointment (MYCOLOG) Apply 1 application topically 2 (two) times daily as needed (rash).     . Omega-3 Fatty Acids (FISH OIL) 1000 MG CAPS Take 1 capsule by mouth 2 (two) times daily.    Marland Kitchen oxyCODONE (ROXICODONE) 5 MG immediate release tablet Take 1 tablet (5 mg total) by mouth every 6 (six) hours as needed for moderate pain. 20 tablet 0  . OXYGEN Inhale 2.5 L into the lungs as needed.     . pregabalin (LYRICA) 75 MG capsule Take 75 mg by mouth 2 (two) times daily.     No current facility-administered medications for this visit.   REVIEW OF SYSTEMS: Valu.Nieves ] denotes positive finding; [  ] denotes negative finding  CARDIOVASCULAR:  [ ]  chest pain   [ ]  dyspnea on exertion    CONSTITUTIONAL:  [ ]  fever   [ ]  chills  PHYSICAL EXAM: Filed Vitals:  04/13/15 1455  BP: 143/55  Pulse: 58  Height: 5\' 2"  (1.575 m)  Weight: 258 lb (117.028 kg)  SpO2: 98%   GENERAL: The patient is a well-nourished female, in no acute distress. The vital signs are documented above. CARDIOVASCULAR: There is a regular rate and rhythm. PULMONARY: There is good air exchange bilaterally without wheezing or rales. Her fistula has an excellent bruit and thrill. The lower half of the fistula appears to be usable.  DUPLEX OF AV FISTULA: The diameters of the fistula range from a 0.5 to-0.96 cm.  MEDICAL ISSUES: STAGE IV CHRONIC KIDNEY DISEASE: I think that her fistula could be used if needed for dialysis. Although it is somewhat deep in the upper part of the arm as per my previous note she did not wish to consider superficialization. Think there is enough room in the lower part of the fistula to use for access. Therefore we  will hold off on plans to superficialize the fistula unless the dialysis nurses have problems cannulating this once she starts dialysis. I will see her back as needed.  Deitra Mayo [facility]: [phone]

## 2015-05-16 ENCOUNTER — Other Ambulatory Visit: Payer: Self-pay

## 2015-06-06 ENCOUNTER — Other Ambulatory Visit: Payer: Self-pay

## 2015-06-06 DIAGNOSIS — Z1231 Encounter for screening mammogram for malignant neoplasm of breast: Secondary | ICD-10-CM

## 2015-06-15 ENCOUNTER — Ambulatory Visit
Admission: RE | Admit: 2015-06-15 | Discharge: 2015-06-15 | Disposition: A | Discharge status: Home / Self Care | Payer: Medicare Other | Source: Ambulatory Visit

## 2015-06-15 DIAGNOSIS — Z1231 Encounter for screening mammogram for malignant neoplasm of breast: Secondary | ICD-10-CM

## 2015-08-24 ENCOUNTER — Emergency Department (HOSPITAL_COMMUNITY)
Admission: EM | Admit: 2015-08-24 | Discharge: 2015-08-24 | Disposition: A | Discharge status: Home / Self Care | Payer: Medicare Other | Attending: Emergency Medicine | Admitting: Emergency Medicine

## 2015-08-24 ENCOUNTER — Encounter (HOSPITAL_COMMUNITY): Payer: Self-pay | Admitting: Emergency Medicine

## 2015-08-24 DIAGNOSIS — I5032 Chronic diastolic (congestive) heart failure: Secondary | ICD-10-CM | POA: Insufficient documentation

## 2015-08-24 DIAGNOSIS — E1142 Type 2 diabetes mellitus with diabetic polyneuropathy: Secondary | ICD-10-CM | POA: Diagnosis not present

## 2015-08-24 DIAGNOSIS — I120 Hypertensive chronic kidney disease with stage 5 chronic kidney disease or end stage renal disease: Secondary | ICD-10-CM | POA: Insufficient documentation

## 2015-08-24 DIAGNOSIS — Z9861 Coronary angioplasty status: Secondary | ICD-10-CM | POA: Insufficient documentation

## 2015-08-24 DIAGNOSIS — Z872 Personal history of diseases of the skin and subcutaneous tissue: Secondary | ICD-10-CM | POA: Insufficient documentation

## 2015-08-24 DIAGNOSIS — M545 Low back pain, unspecified: Secondary | ICD-10-CM

## 2015-08-24 DIAGNOSIS — Z794 Long term (current) use of insulin: Secondary | ICD-10-CM | POA: Insufficient documentation

## 2015-08-24 DIAGNOSIS — E785 Hyperlipidemia, unspecified: Secondary | ICD-10-CM | POA: Insufficient documentation

## 2015-08-24 DIAGNOSIS — Z87891 Personal history of nicotine dependence: Secondary | ICD-10-CM | POA: Diagnosis not present

## 2015-08-24 DIAGNOSIS — Z9889 Other specified postprocedural states: Secondary | ICD-10-CM | POA: Diagnosis not present

## 2015-08-24 DIAGNOSIS — I252 Old myocardial infarction: Secondary | ICD-10-CM | POA: Insufficient documentation

## 2015-08-24 DIAGNOSIS — Z8673 Personal history of transient ischemic attack (TIA), and cerebral infarction without residual deficits: Secondary | ICD-10-CM | POA: Diagnosis not present

## 2015-08-24 DIAGNOSIS — D649 Anemia, unspecified: Secondary | ICD-10-CM | POA: Insufficient documentation

## 2015-08-24 DIAGNOSIS — Z992 Dependence on renal dialysis: Secondary | ICD-10-CM | POA: Insufficient documentation

## 2015-08-24 DIAGNOSIS — Z7982 Long term (current) use of aspirin: Secondary | ICD-10-CM | POA: Insufficient documentation

## 2015-08-24 DIAGNOSIS — N186 End stage renal disease: Secondary | ICD-10-CM | POA: Insufficient documentation

## 2015-08-24 LAB — CBC WITH DIFFERENTIAL/PLATELET
BASOS ABS: 0 10*3/uL (ref 0.0–0.1)
Basophils Relative: 0 %
Eosinophils Absolute: 0.2 10*3/uL (ref 0.0–0.7)
Eosinophils Relative: 3 %
HEMATOCRIT: 29.4 % — AB (ref 36.0–46.0)
Hemoglobin: 9.6 g/dL — ABNORMAL LOW (ref 12.0–15.0)
LYMPHS PCT: 28 %
Lymphs Abs: 1.8 10*3/uL (ref 0.7–4.0)
MCH: 28.7 pg (ref 26.0–34.0)
MCHC: 32.7 g/dL (ref 30.0–36.0)
MCV: 87.8 fL (ref 78.0–100.0)
Monocytes Absolute: 0.4 10*3/uL (ref 0.1–1.0)
Monocytes Relative: 6 %
NEUTROS ABS: 4.1 10*3/uL (ref 1.7–7.7)
Neutrophils Relative %: 63 %
PLATELETS: 193 10*3/uL (ref 150–400)
RBC: 3.35 MIL/uL — ABNORMAL LOW (ref 3.87–5.11)
RDW: 16.9 % — ABNORMAL HIGH (ref 11.5–15.5)
WBC: 6.5 10*3/uL (ref 4.0–10.5)

## 2015-08-24 LAB — COMPREHENSIVE METABOLIC PANEL
ALT: 12 U/L — ABNORMAL LOW (ref 14–54)
ANION GAP: 12 (ref 5–15)
AST: 18 U/L (ref 15–41)
Albumin: 3.3 g/dL — ABNORMAL LOW (ref 3.5–5.0)
Alkaline Phosphatase: 97 U/L (ref 38–126)
BILIRUBIN TOTAL: 0.5 mg/dL (ref 0.3–1.2)
BUN: 100 mg/dL — ABNORMAL HIGH (ref 6–20)
CO2: 26 mmol/L (ref 22–32)
Calcium: 9.7 mg/dL (ref 8.9–10.3)
Chloride: 100 mmol/L — ABNORMAL LOW (ref 101–111)
Creatinine, Ser: 3.88 mg/dL — ABNORMAL HIGH (ref 0.44–1.00)
GFR calc Af Amer: 13 mL/min — ABNORMAL LOW (ref >60)
GFR, EST NON AFRICAN AMERICAN: 11 mL/min — AB (ref >60)
Glucose, Bld: 208 mg/dL — ABNORMAL HIGH (ref 65–99)
POTASSIUM: 4.1 mmol/L (ref 3.5–5.1)
Sodium: 138 mmol/L (ref 135–145)
TOTAL PROTEIN: 7.9 g/dL (ref 6.5–8.1)

## 2015-08-24 LAB — URINE MICROSCOPIC-ADD ON

## 2015-08-24 LAB — URINALYSIS, ROUTINE W REFLEX MICROSCOPIC
Bilirubin Urine: NEGATIVE
Glucose, UA: NEGATIVE mg/dL
Hgb urine dipstick: NEGATIVE
KETONES UR: NEGATIVE mg/dL
NITRITE: NEGATIVE
PH: 6 (ref 5.0–8.0)
Protein, ur: 30 mg/dL — AB
Specific Gravity, Urine: 1.012 (ref 1.005–1.030)
Urobilinogen, UA: 0.2 mg/dL (ref 0.0–1.0)

## 2015-08-24 MED ORDER — MORPHINE SULFATE (PF) 4 MG/ML IV SOLN
4.0000 mg | Freq: Once | INTRAVENOUS | Status: AC
  Administered 2015-08-24: 4 mg via INTRAMUSCULAR
  Filled 2015-08-24: qty 1

## 2015-08-24 MED ORDER — ONDANSETRON 8 MG PO TBDP
8.0000 mg | ORAL_TABLET | Freq: Once | ORAL | Status: AC
  Administered 2015-08-24: 8 mg via ORAL
  Filled 2015-08-24: qty 1

## 2015-08-24 NOTE — ED Notes (Signed)
Bed: [id number] Expected date: 08/24/15 Expected time: 7:46 AM Means of arrival: Ambulance Comments: Back pain

## 2015-08-24 NOTE — ED Provider Notes (Signed)
CSN: [id number]     Arrival date & time 08/24/15  [id number] History   First MD Initiated Contact with Patient 08/24/15 0800     Chief Complaint  Patient presents with  . Back Pain     (Consider location/radiation/quality/duration/timing/severity/associated sxs/prior Treatment) HPI Patient presents with concern of back and side pain. Pain began about 12 hours ago, initially with pain on the right side, which subsequently radiated to the low back, bilaterally. Since onset has been persistent, worsening, not improved with oxycodone. No new abdominal pain, though there is nausea. No new loss of sensation in her right leg. Patient has multiple medical issues, including diabetes, and is now status post bka of the left leg.  Past Medical History  Diagnosis Date  . ST elevation myocardial infarction (STEMI) of inferior wall ([place])  03/2009    Ostial/proximal RCA occlusion treated with BMS stent  . CAD S/P percutaneous coronary angioplasty 5/110     status post PCI to the RCA for inferior STEMI  . Cancer ([place])     endo ca  . Hypertension associated with diabetes ([place])   . Hyperlipidemia LDL goal <70   . Diabetes mellitus, type II, insulin dependent ([place])     With complications - CAD, CVA, peripheral ulcer ,  . Stroke/cerebrovascular accident [facility])  4/30/ 2014    Right-sided weakness, mostly with balance issues and only mild weakness.  . Diabetic peripheral neuropathy associated with type 2 diabetes mellitus ([place])   . Obesity, Class III, BMI 40-49.9 (morbid obesity) ([facility])     BMI 43; 5' 2',  235 pounds 6.4 ounces  . CKD (chronic kidney disease), stage IV ([place])     Recent acute on chronic exacerbation in July 2014  . PAD (peripheral artery disease) ([place])   . Diabetic foot ulcer ([place])   . Dry gangrene ([facility])     Left second toe; now on Sole of L foot  . Anemia   . Chronic diastolic heart failure, NYHA class 2 ([place])     Echocardiogram 06/2014:  Technically difficult study. Normal LV size with  mild LVH. EF 55-60%. Moderate diastolic dysfunction, normal RV size and function. Likely aortic sclerosis without stenosis   Past Surgical History  Procedure Laterality Date  . Abdominal hysterectomy    . Leg tendon surgery      patient fell in a store right leg  . Leg surgery      hole in bone( during Childhood)  . Cardiac catheterization  03/31/2009    Proximal RCA thrombotic occlusion (inferior STEMI) other coronaries but in a codominant system  . Percutaneous coronary stent intervention (pci-s)  03/31/2009    PTCA , bare-metal stent 3.5 mm x 24 mm ostium of RCA ,EF 55%  . Amputation Left 08/07/2013    Procedure: left midfoot amputation;  Surgeon: Marianna Payment, MD;  Location: [facility] ORS;  Service: Orthopedics;  Laterality: Left;  . Amputation Left 08/09/2013    Procedure: AMPUTATION BELOW KNEE;  Surgeon: Marianna Payment, MD;  Location: [facility] ORS;  Service: Orthopedics;  Laterality: Left;  . Av fistula placement Left 12/03/2014    Procedure: ARTERIOVENOUS (AV) FISTULA CREATION;  Surgeon: Angelia Mould, MD;  Location: [facility];  Service: Vascular;  Laterality: Left;  . Transthoracic echocardiogram  06/2014    Technically difficult study. Normal LV size with mild LVH. EF 55-60%. Moderate - grade 2  diastolic dysfunction, normal RV size and function. Likely aortic sclerosis without stenosis   Family History  Problem Relation  Age of Onset  . Alcoholism Mother     Died from complications  . Alcoholism Father     Died from complications  . Cancer Father     Throat  . Pneumonia Father   . Diabetes Father   . Hypertension Father   . Heart disease      Unknown, unclear. Patient is not a good historian.  Essentially no pertinent history known   Social History  Substance Use Topics  . Smoking status: Former Smoker    Quit date: 04/29/1995  . Smokeless tobacco: Never Used  . Alcohol Use: No   OB History    No data available     Review of Systems  Constitutional:       Per  HPI, otherwise negative  HENT:       Per HPI, otherwise negative  Respiratory:       Per HPI, otherwise negative  Cardiovascular:       Per HPI, otherwise negative  Gastrointestinal: Negative for vomiting.  Endocrine:       Negative aside from HPI  Genitourinary:       Neg aside from HPI   Musculoskeletal:       Per HPI, otherwise negative  Skin: Negative.   Neurological: Negative for syncope.      Allergies  Review of patient's allergies indicates no known allergies.  Home Medications   Prior to Admission medications   Medication Sig Start Date End Date Taking? Authorizing Provider  glucose blood (ACCU-CHEK AVIVA PLUS) test strip USE ONE STRIP TO CHECK GLUCOSE BEFORE MEALS AND AT BEDTIME 06/18/14  Yes Historical Provider, MD  Insulin Pen Needle 31G X 8 MM MISC USE TO ADMINISTER INSULIN VIA LANTUS AND NOVOLOG 4 TIMES DAILY 08/30/14  Yes Historical Provider, MD  Lancets (FREESTYLE) lancets Use as directed to check blood sugar 3 times daily 08/30/14 08/30/15 Yes Historical Provider, MD  aspirin 81 MG tablet Take 81 mg by mouth daily.    Historical Provider, MD  atorvastatin (LIPITOR) 80 MG tablet Take 1 tablet (80 mg total) by mouth daily at 6 PM. Patient taking differently: Take 40 mg by mouth daily at 6 PM.  12/07/14   Thurnell Lose, MD  buPROPion (WELLBUTRIN XL) 300 MG 24 hr tablet Take 300 mg by mouth daily. 11/23/14 11/23/15  Historical Provider, MD  carvedilol (COREG) 25 MG tablet Take 25 mg by mouth. 11/23/14   Historical Provider, MD  ferrous sulfate 325 (65 FE) MG tablet Take 325 mg by mouth daily with breakfast.    Historical Provider, MD  furosemide (LASIX) 40 MG tablet Take 80 mg by mouth 2 (two) times daily.  08/10/14   Verlee Monte, MD  hydrALAZINE (APRESOLINE) 50 MG tablet Take 1 tablet (50 mg total) by mouth every 8 (eight) hours. Patient taking differently: Take 25 mg by mouth every 12 (twelve) hours.  06/27/14   Theodis Blaze, MD  insulin aspart (NOVOLOG) 100 UNIT/ML  injection Inject 0-15 Units into the skin 3 (three) times daily with meals. Sliding scale insulin CBG 70 - 120: 0 units: CBG 121 - 150: 2 units; CBG 151 - 200: 3 units; CBG 201 - 250: 5 units; CBG 251 - 300: 8 units;CBG 301 - 350: 11 units; CBG 351 - 400: 15 units; CBG > 400 : 15 units and notify your doctor 06/09/13   Ripudeep Krystal Eaton, MD  insulin glargine (LANTUS) 100 UNIT/ML injection Inject 0.45 mLs (45 Units total) into the skin at bedtime.  09/15/14   Shanker Kristeen Mans, MD  nystatin-triamcinolone ointment (MYCOLOG) Apply 1 application topically 2 (two) times daily as needed (rash).     Historical Provider, MD  Omega-3 Fatty Acids (FISH OIL) 1000 MG CAPS Take 1 capsule by mouth 2 (two) times daily.    Historical Provider, MD  oxyCODONE (ROXICODONE) 5 MG immediate release tablet Take 1 tablet (5 mg total) by mouth every 6 (six) hours as needed for moderate pain. 12/03/14   Samantha J Rhyne, PA-C  OXYGEN Inhale 2.5 L into the lungs as needed.     Historical Provider, MD  pregabalin (LYRICA) 75 MG capsule Take 75 mg by mouth 2 (two) times daily.    Historical Provider, MD   BP 175/69 mmHg  Pulse 83  Temp(Src) 98.8 F (37.1 C) (Oral)  Resp 16  SpO2 98% Physical Exam  Constitutional: She is oriented to person, place, and time. She appears well-developed and well-nourished. No distress.  Obese elderly female resting, seemingly comfortably, in gurney  HENT:  Head: Normocephalic and atraumatic.  Eyes: Conjunctivae and EOM are normal.  Cardiovascular: Normal rate and regular rhythm.   Pulmonary/Chest: Effort normal and breath sounds normal. No stridor. No respiratory distress.  Abdominal: She exhibits no distension. There is no tenderness. There is no rebound.  Musculoskeletal: She exhibits no edema.  Left leg amputation, prosthesis in place Patient can flex and extend the right hip spontaneously, without apparent discomfort  Neurological: She is alert and oriented to person, place, and time. No  cranial nerve deficit.  Skin: Skin is warm and dry.  Psychiatric: She has a normal mood and affect.  Nursing note and vitals reviewed.   ED Course  Procedures (including critical care time)  Patient is ambulatory, but requests removal of her prosthetic limb, and placement on a bedpan.  She requests intravenous injection of her analgesia.   Labs Review Labs Reviewed  COMPREHENSIVE METABOLIC PANEL - Abnormal; Notable for the following:    Chloride 100 (*)    Glucose, Bld 208 (*)    BUN 100 (*)    Creatinine, Ser 3.88 (*)    Albumin 3.3 (*)    ALT 12 (*)    GFR calc non Af Amer 11 (*)    GFR calc Af Amer 13 (*)    All other components within normal limits  CBC WITH DIFFERENTIAL/PLATELET - Abnormal; Notable for the following:    RBC 3.35 (*)    Hemoglobin 9.6 (*)    HCT 29.4 (*)    RDW 16.9 (*)    All other components within normal limits  URINALYSIS, ROUTINE W REFLEX MICROSCOPIC (NOT AT [facility]) - Abnormal; Notable for the following:    Protein, ur 30 (*)    Leukocytes, UA SMALL (*)    All other components within normal limits  URINE MICROSCOPIC-ADD ON - Abnormal; Notable for the following:    Bacteria, UA FEW (*)    Casts HYALINE CASTS (*)    All other components within normal limits   Labs largely reassuring, noticed for urinary tract infection, worsening kidney disease systemic infection.  MDM  Patient presents with low back pain. Patient has no evidence for new neurovascular compromise, is hemodynamically stable, moving all extremities Patient is ambulatory, though she prefers to have a bedpan. Labs are consistent for her, no evidence for progressive renal dysfunction, nor infection. Patient discharged in stable condition after provision of analgesia here. Patient has home for narcotics.  Carmin Muskrat, MD 08/24/15 1057

## 2015-08-24 NOTE — ED Notes (Signed)
Patient getting dressed. Vomited x1 states she was trying to vomit yesterday and was unsuccessful. Pt states she drank a few sips of water upon getting dressed for d/c and threw it up. RN noted small clear amount in trash can. Pt request zofran.

## 2015-08-24 NOTE — ED Notes (Addendum)
64 yo with New onset of non-traumatic right lower side back pain. States she took an oxycodone with no relief. Denies recent trauma or injury to the back. Denies N/V/D or fever. A/O x4

## 2015-08-24 NOTE — ED Notes (Signed)
Left prosthetic noted. Pt request to remove. Removed  by NT and is in the room with patient.

## 2015-08-24 NOTE — Discharge Instructions (Signed)
As discussed, your evaluation today has been largely reassuring.  But, it is important that you monitor your condition carefully, and do not hesitate to return to the ED if you develop new, or concerning changes in your condition.  Otherwise, please follow-up with your physician for appropriate ongoing care.  Back Pain, Adult Back pain is very common in adults.The cause of back pain is rarely dangerous and the pain often gets better over time.The cause of your back pain may not be known. Some common causes of back pain include:  Strain of the muscles or ligaments supporting the spine.  Wear and tear (degeneration) of the spinal disks.  Arthritis.  Direct injury to the back. For many people, back pain may return. Since back pain is rarely dangerous, most people can learn to manage this condition on their own. HOME CARE INSTRUCTIONS Watch your back pain for any changes. The following actions may help to lessen any discomfort you are feeling:  Remain active. It is stressful on your back to sit or stand in one place for long periods of time. Do not sit, drive, or stand in one place for more than 30 minutes at a time. Take short walks on even surfaces as soon as you are able.Try to increase the length of time you walk each day.  Exercise regularly as directed by your health care provider. Exercise helps your back heal faster. It also helps avoid future injury by keeping your muscles strong and flexible.  Do not stay in bed.Resting more than 1-2 days can delay your recovery.  Pay attention to your body when you bend and lift. The most comfortable positions are those that put less stress on your recovering back. Always use proper lifting techniques, including:  Bending your knees.  Keeping the load close to your body.  Avoiding twisting.  Find a comfortable position to sleep. Use a firm mattress and lie on your side with your knees slightly bent. If you lie on your back, put a pillow  under your knees.  Avoid feeling anxious or stressed.Stress increases muscle tension and can worsen back pain.It is important to recognize when you are anxious or stressed and learn ways to manage it, such as with exercise.  Take medicines only as directed by your health care provider. Over-the-counter medicines to reduce pain and inflammation are often the most helpful.Your health care provider may prescribe muscle relaxant drugs.These medicines help dull your pain so you can more quickly return to your normal activities and healthy exercise.  Apply ice to the injured area:  Put ice in a plastic bag.  Place a towel between your skin and the bag.  Leave the ice on for 20 minutes, 2-3 times a day for the first 2-3 days. After that, ice and heat may be alternated to reduce pain and spasms.  Maintain a healthy weight. Excess weight puts extra stress on your back and makes it difficult to maintain good posture. SEEK MEDICAL CARE IF:  You have pain that is not relieved with rest or medicine.  You have increasing pain going down into the legs or buttocks.  You have pain that does not improve in one week.  You have night pain.  You lose weight.  You have a fever or chills. SEEK IMMEDIATE MEDICAL CARE IF:   You develop new bowel or bladder control problems.  You have unusual weakness or numbness in your arms or legs.  You develop nausea or vomiting.  You develop abdominal pain.  You feel faint.   This information is not intended to replace advice given to you by your health care provider. Make sure you discuss any questions you have with your health care provider.   Document Released: 11/05/2005 Document Revised: 11/26/2014 Document Reviewed: 03/09/2014 Elsevier Interactive Patient Education [employer].

## 2016-01-09 ENCOUNTER — Other Ambulatory Visit: Payer: Self-pay | Admitting: Internal Medicine

## 2016-01-16 ENCOUNTER — Other Ambulatory Visit: Payer: Self-pay | Admitting: Internal Medicine

## 2016-01-19 ENCOUNTER — Encounter: Payer: Self-pay | Admitting: Cardiology

## 2016-01-19 ENCOUNTER — Ambulatory Visit (INDEPENDENT_AMBULATORY_CARE_PROVIDER_SITE_OTHER): Payer: Medicare Other | Admitting: Cardiology

## 2016-01-19 VITALS — BP 144/64 | HR 67 | Ht 62.0 in | Wt 244.0 lb

## 2016-01-19 DIAGNOSIS — I252 Old myocardial infarction: Secondary | ICD-10-CM

## 2016-01-19 DIAGNOSIS — I739 Peripheral vascular disease, unspecified: Secondary | ICD-10-CM | POA: Diagnosis not present

## 2016-01-19 DIAGNOSIS — I2510 Atherosclerotic heart disease of native coronary artery without angina pectoris: Secondary | ICD-10-CM | POA: Diagnosis not present

## 2016-01-19 DIAGNOSIS — E1159 Type 2 diabetes mellitus with other circulatory complications: Secondary | ICD-10-CM

## 2016-01-19 DIAGNOSIS — E785 Hyperlipidemia, unspecified: Secondary | ICD-10-CM | POA: Diagnosis not present

## 2016-01-19 DIAGNOSIS — I152 Hypertension secondary to endocrine disorders: Secondary | ICD-10-CM

## 2016-01-19 DIAGNOSIS — Z9861 Coronary angioplasty status: Secondary | ICD-10-CM

## 2016-01-19 DIAGNOSIS — I5032 Chronic diastolic (congestive) heart failure: Secondary | ICD-10-CM

## 2016-01-19 DIAGNOSIS — I10 Essential (primary) hypertension: Secondary | ICD-10-CM | POA: Diagnosis not present

## 2016-01-19 NOTE — Progress Notes (Signed)
PCP: Suzanna Obey, MD  Clinic Note: Chief Complaint  Patient presents with  . Follow-up    6 MONTHS  . Chest Pain    C/O NONE  . Shortness of Breath    C/O NONE  . Edema    C/O NONE    HPI: [name] is a 65 y.o. female with a PMH below who presents today for ~annual f/u for CAD-InfSTEMI, Chronic HFPEF, CKD-4, PAD, DM-2 & HTN/HLD. - has had LUE HD fistula placed.  Inf STEMI May 2010 -- BMS-RCA  PAD- L BKA in Sept 2014  [name] was last seen in Apr 12, 2015.  No significant issues noted at that time. She had a CHF admission in September 2015, but none since.  Recent Hospitalizations: Emergency room visit back in October 2016 for back pain.  Studies Reviewed:   LEA Dopplers 2015/04/12: Stable occlusion of the right PTA. Otherwise no change from August 2014  Interval History: [name] presents for annual follow-up without any major cardiac complaints. No significant complaints of PND, orthopnea or edema to suggest worsening heart failure. She is on a standing dose of Lasix 80 mg twice a day as well as max dose of carvedilol 25 twice a day. She denies any anginal symptoms with rest or exertion. She is not very active, being limited following her amputation. Activity she does she denie  No chest pain or shortness of breath with rest or exertion.  No PND, orthopnea or edema.  No palpitations, lightheadedness, dizziness, weakness or syncope/near syncope. No TIA/amaurosis fugax symptoms.  No claudication.  ROS: A comprehensive was performed. Review of Systems  Constitutional: Negative for malaise/fatigue.  HENT: Negative for congestion and nosebleeds.   Eyes: Negative for blurred vision.  Respiratory: Positive for shortness of breath (Baseline).   Cardiovascular:       She sleeps with 2-3 pillows for comfort, not orthopnea  Gastrointestinal: Negative for heartburn, blood in stool and melena.  Genitourinary: Negative for hematuria.  Musculoskeletal: Positive for joint  pain. Negative for myalgias and falls.  Neurological: Negative for focal weakness, loss of consciousness and headaches. Dizziness: Some positional dizziness.  Endo/Heme/Allergies: Does not bruise/bleed easily.  Psychiatric/Behavioral: Positive for memory loss (Mostly with discussion had with the daughter.). Negative for depression. The patient is not nervous/anxious.   All other systems reviewed and are negative.   Past Medical History  Diagnosis Date  . ST elevation myocardial infarction (STEMI) of inferior wall ([place])  03/2009    Ostial/proximal RCA occlusion treated with BMS stent  . CAD S/P percutaneous coronary angioplasty 5/110     status post PCI to the RCA for inferior STEMI  . Cancer ([place])     endo ca  . Hypertension associated with diabetes ([place])   . Hyperlipidemia LDL goal <70   . Diabetes mellitus, type II, insulin dependent ([place])     With complications - CAD, CVA, peripheral ulcer ,  . Stroke/cerebrovascular accident [facility])  4/30/ 2014    Right-sided weakness, mostly with balance issues and only mild weakness.  . Diabetic peripheral neuropathy associated with type 2 diabetes mellitus ([place])   . Obesity, Class III, BMI 40-49.9 (morbid obesity) (HCC)     BMI 43; 5' 2',  235 pounds 6.4 ounces  . CKD (chronic kidney disease), stage IV ([place])     Recent acute on chronic exacerbation in July 2014  . PAD (peripheral artery disease) ([place])      -- Most recent Dopplers 2015/04/12 Status post left BKA. Known  right PTA occlusion  . Diabetic foot ulcer ([place])   . Dry gangrene (HCC)     Left second toe; now on Sole of L foot  . Anemia   . Chronic diastolic heart failure, NYHA class 2 ([place])     Echocardiogram 06/2014:  Technically difficult study. Normal LV size with mild LVH. EF 55-60%. Moderate diastolic dysfunction, normal RV size and function. Likely aortic sclerosis without stenosis    Past Surgical History  Procedure Laterality Date  . Abdominal hysterectomy    . Leg tendon  surgery      patient fell in a store right leg  . Leg surgery      hole in bone( during Childhood)  . Cardiac catheterization  03/31/2009    Proximal RCA thrombotic occlusion (inferior STEMI) other coronaries but in a codominant system  . Percutaneous coronary stent intervention (pci-s)  03/31/2009    PTCA , bare-metal stent 3.5 mm x 24 mm ostium of RCA ,EF 55%  . Amputation Left 08/07/2013    Procedure: left midfoot amputation;  Surgeon: Marianna Payment, MD;  Location: [facility] ORS;  Service: Orthopedics;  Laterality: Left;  . Amputation Left 08/09/2013    Procedure: AMPUTATION BELOW KNEE;  Surgeon: Marianna Payment, MD;  Location: [facility] OR[facility];  Service: Orthopedics;  Laterality: Left;  . Av fistula placement Left 12/03/2014    Procedure: ARTERIOVENOUS (AV) FISTULA CREATION;  Surgeon: Angelia Mould, MD;  Location: [facility] OR;  Service: Vascular;  Laterality: Left;  . Transthoracic echocardiogram  06/2014    Technically difficult study. Normal LV size with mild LVH. EF 55-60%. Moderate - grade 2  diastolic dysfunction, normal RV size and function. Likely aortic sclerosis without stenosis   Prior to Admission medications   Medication Sig Start Date End Date Taking? Authorizing Provider  aspirin 81 MG tablet Take 81 mg by mouth daily.   Yes Historical Provider, MD  atorvastatin (LIPITOR) 80 MG tablet Take 1 tablet (80 mg total) by mouth daily at 6 PM. Patient taking differently: Take 40 mg by mouth daily at 6 PM.  12/07/14  Yes Thurnell Lose, MD  carvedilol (COREG) 25 MG tablet Take 25 mg by mouth. 11/23/14  Yes Historical Provider, MD  ferrous sulfate 325 (65 FE) MG tablet Take 325 mg by mouth daily with breakfast.   Yes Historical Provider, MD  furosemide (LASIX) 40 MG tablet Take 80 mg by mouth 2 (two) times daily.  08/10/14  Yes Verlee Monte, MD  glucose blood (ACCU-CHEK AVIVA PLUS) test strip USE ONE STRIP TO CHECK GLUCOSE BEFORE MEALS AND AT BEDTIME 06/18/14  Yes Historical Provider, MD    insulin glargine (LANTUS) 100 UNIT/ML injection Inject 0.45 mLs (45 Units total) into the skin at bedtime. 09/15/14  Yes Shanker Kristeen Mans, MD  insulin lispro (HUMALOG) 100 UNIT/ML injection Inject into the skin once. Sliding scale insulin CBG 70 - 120: 0 units: CBG 121 - 150: 2 units; CBG 151 - 200: 3 units; CBG 201 - 250: 5 units; CBG 251 - 300: 8 units;CBG 301 - 350: 11 units; CBG 351 - 400: 15 units; CBG > 400 : 15 units and notify your doctor   Yes Historical Provider, MD  Insulin Pen Needle 31G X 8 MM MISC USE TO ADMINISTER INSULIN VIA LANTUS AND NOVOLOG 4 TIMES DAILY 08/30/14  Yes Historical Provider, MD  nystatin-triamcinolone ointment (MYCOLOG) Apply 1 application topically 2 (two) times daily as needed (rash).    Yes Historical Provider, MD  Omega-3  Fatty Acids (FISH OIL) 1000 MG CAPS Take 1 capsule by mouth 2 (two) times daily.   Yes Historical Provider, MD  oxyCODONE (ROXICODONE) 5 MG immediate release tablet Take 1 tablet (5 mg total) by mouth every 6 (six) hours as needed for moderate pain. 12/03/14  Yes Samantha J Rhyne, PA-C  OXYGEN Inhale 2.5 L into the lungs as needed.    Yes Historical Provider, MD  pregabalin (LYRICA) 75 MG capsule Take 75 mg by mouth 2 (two) times daily.   Yes Historical Provider, MD    No Known Allergies   Social History   Social History  . Marital Status: Widowed    Spouse Name: N/A  . Number of Children: 4  . Years of Education: N/A   Occupational History  .     Social History Main Topics  . Smoking status: Former Smoker    Quit date: 04/29/1995  . Smokeless tobacco: Never Used  . Alcohol Use: No  . Drug Use: No  . Sexual Activity: No   Other Topics Concern  . None   Social History Narrative   Was previously living in an assisted living center while recovering from her stroke. Lesion is now recently moved back home with her family.    She is attended by her daughter, who is super morbidly obese.   Family History  Problem Relation Age  of Onset  . Alcoholism Mother     Died from complications  . Alcoholism Father     Died from complications  . Cancer Father     Throat  . Pneumonia Father   . Diabetes Father   . Hypertension Father   . Heart disease      Unknown, unclear. Patient is not a good historian.  Essentially no pertinent history known    Wt Readings from Last 3 Encounters:  01/19/16 244 lb (110.678 kg)  04/13/15 258 lb (117.028 kg)  03/11/15 258 lb 14.4 oz (117.436 kg)    PHYSICAL EXAM BP 144/64 mmHg  Pulse 67  Ht 5\' 2"  (1.575 m)  Wt 244 lb (110.678 kg)  BMI 44.62 kg/m2 General appearance: alert, cooperative, appears stated age, no distress and morbidly obese ; well groomed. Poor story. HEENT: [place]/AT, EOMI, MMM, anicteric sclera Neck: no adenopathy, no carotid bruit and no JVD Lungs: clear to auscultation bilaterally, normal percussion bilaterally and non-labored Heart: regular rate and rhythm, S1& S2 normal, + S4, No M/R. Very distant heart sounds due to body habitus Abdomen: soft, non-tender; bowel sounds normal; no masses, unable to palpate organomegaly. Extremities: status post left BKA with prosthetic in place. Right leg has weak, but palpable DP pulse. 1-2+ edema. Left brachiocephalic AV fistula with normal thrill. Neurologic: Mental status: Alert, oriented, thought content appropriate; Cranial nerves: normal (II-XII grossly intact) Psych: somewhat blunted mood and affect.    Adult ECG Report  Rate: 67 ;  Rhythm: normal sinus rhythm and Nonspecific ST and T-wave changes.;   Narrative Interpretation: Stable EKG from April 2016   Other studies Reviewed: Additional studies/ records that were reviewed today include:  Recent Labs:  Lab Results  Component Value Date   CHOL 145 03/14/2015   HDL 31* 03/14/2015   LDLCALC 72 03/14/2015   LDLDIRECT 115* 08/31/2014   TRIG 209* 03/14/2015   CHOLHDL 4.7 03/14/2015    ASSESSMENT / PLAN: Problem List Items Addressed This Visit    Status post  myocardial infarction of inferior wall (Chronic)    [place] until no further anginal symptoms.  She had a bare-metal stent to the RCA back in 2010. No recurrent anginal symptoms. No notable wall motion abnormality echocardiogram with preserved function.  On aspirin, high-dose atorvastatin and carvedilol.      Severe obesity (BMI >= 40) (HCC) (Chronic)    Unable to walk now. Limiting any potential chance for exercise. We discussed trying to excise but for whatever she can do and dietary discretion.      Relevant Medications   insulin lispro (HUMALOG) 100 UNIT/ML injection   Peripheral arterial disease: s/p L BKA; Occluded R Pop A, 2 V runnoff (Chronic)    Due for Re-check of Dopplers. No claudication.      Relevant Orders   EKG 12-Lead   VAS [place] LOWER EXTREMITY ARTERIAL DUPLEX   VAS [place] ABI WITH/WO TBI   Hypertension associated with diabetes ([place]) (Chronic)    Borderline control today. I'm reluctant to add any additional medications. Would consider either ACE inhibitor/ARB or amlodipine.      Relevant Medications   insulin lispro (HUMALOG) 100 UNIT/ML injection   Hyperlipidemia LDL goal <70 (Chronic)    On high-dose atorvastatin. Has not had lipids checked since April of last year. Usually followed by PCP.      Relevant Orders   EKG 12-Lead   VAS [place] LOWER EXTREMITY ARTERIAL DUPLEX   VAS [place] ABI WITH/WO TBI   Essential hypertension - Primary (Chronic)   Relevant Orders   EKG 12-Lead   VAS [place] LOWER EXTREMITY ARTERIAL DUPLEX   VAS [place] ABI WITH/WO TBI   Chronic diastolic congestive heart failure, NYHA class 2 (HCC) (Chronic)    No active heart failure symptoms currently. She is on high-dose Lasix. Available all. Not on the reduction with ACE inhibitor or ARB. This would be the next consideration.      Relevant Orders   EKG 12-Lead   VAS [place] LOWER EXTREMITY ARTERIAL DUPLEX   VAS [place] ABI WITH/WO TBI   CAD S/P PCI TO RCA for Inferior STEMI. BMS (Chronic)    Doing well with no active  anginal symptoms. On aspirin and statin beta blocker. Had previously been on hydralazine, they have been stopped apparently due to hypotension.      Relevant Orders   EKG 12-Lead   VAS [place] LOWER EXTREMITY ARTERIAL DUPLEX   VAS [place] ABI WITH/WO TBI      Current medicines are reviewed at length with the patient today. (+/- concerns) none The following changes have been made: none Studies Ordered:   Orders Placed This Encounter  Procedures  . EKG 12-Lead      Leonie Man, M.D., M.S. Interventional Cardiologist   Pager # [phone] Phone # [phone] [address] [place], [place] [address]

## 2016-01-19 NOTE — Patient Instructions (Signed)
NO CAHN GE  CURRENT MEDICATIONS  Your physician wants you to follow-up in: [place] will receive a reminder letter in the mail two months in advance. If you don't receive a letter, please call our office to schedule the follow-up appointment.  Your physician has requested that you have a lower  extremity arterial duplex. This test is an ultrasound of the arteries in the legs . It looks at arterial blood flow in the legs . Allow one hour for Lower Arterial scans. There are no restrictions or special instructions

## 2016-01-21 ENCOUNTER — Encounter: Payer: Self-pay | Admitting: Cardiology

## 2016-01-21 NOTE — Assessment & Plan Note (Addendum)
Borderline control today. I'm reluctant to add any additional medications. Would consider either ACE inhibitor/ARB or amlodipine.

## 2016-01-21 NOTE — Assessment & Plan Note (Signed)
[name] until no further anginal symptoms. She had a bare-metal stent to the RCA back in 2010. No recurrent anginal symptoms. No notable wall motion abnormality echocardiogram with preserved function.  On aspirin, high-dose atorvastatin and carvedilol.

## 2016-01-21 NOTE — Assessment & Plan Note (Signed)
Due for Re-check of Dopplers. No claudication.

## 2016-01-21 NOTE — Assessment & Plan Note (Signed)
Unable to walk now. Limiting any potential chance for exercise. We discussed trying to excise but for whatever she can do and dietary discretion.

## 2016-01-21 NOTE — Assessment & Plan Note (Signed)
On high-dose atorvastatin. Has not had lipids checked since April of last year. Usually followed by PCP.

## 2016-01-21 NOTE — Assessment & Plan Note (Signed)
Doing well with no active anginal symptoms. On aspirin and statin beta blocker. Had previously been on hydralazine, they have been stopped apparently due to hypotension.

## 2016-01-21 NOTE — Assessment & Plan Note (Signed)
No active heart failure symptoms currently. She is on high-dose Lasix. Available all. Not on the reduction with ACE inhibitor or ARB. This would be the next consideration.

## 2016-01-27 ENCOUNTER — Ambulatory Visit (HOSPITAL_COMMUNITY)
Admission: RE | Admit: 2016-01-27 | Discharge: 2016-01-27 | Disposition: A | Discharge status: Home / Self Care | Payer: Medicare Other | Source: Ambulatory Visit | Attending: Cardiovascular Disease | Admitting: Cardiovascular Disease

## 2016-01-27 DIAGNOSIS — N184 Chronic kidney disease, stage 4 (severe): Secondary | ICD-10-CM | POA: Insufficient documentation

## 2016-01-27 DIAGNOSIS — I739 Peripheral vascular disease, unspecified: Secondary | ICD-10-CM | POA: Diagnosis not present

## 2016-01-27 DIAGNOSIS — I2510 Atherosclerotic heart disease of native coronary artery without angina pectoris: Secondary | ICD-10-CM | POA: Diagnosis not present

## 2016-01-27 DIAGNOSIS — I130 Hypertensive heart and chronic kidney disease with heart failure and stage 1 through stage 4 chronic kidney disease, or unspecified chronic kidney disease: Secondary | ICD-10-CM | POA: Diagnosis not present

## 2016-01-27 DIAGNOSIS — E785 Hyperlipidemia, unspecified: Secondary | ICD-10-CM | POA: Diagnosis not present

## 2016-01-27 DIAGNOSIS — Z9861 Coronary angioplasty status: Secondary | ICD-10-CM | POA: Insufficient documentation

## 2016-01-27 DIAGNOSIS — I5032 Chronic diastolic (congestive) heart failure: Secondary | ICD-10-CM | POA: Diagnosis not present

## 2016-01-27 DIAGNOSIS — E1122 Type 2 diabetes mellitus with diabetic chronic kidney disease: Secondary | ICD-10-CM | POA: Insufficient documentation

## 2016-01-27 DIAGNOSIS — I10 Essential (primary) hypertension: Secondary | ICD-10-CM

## 2016-01-31 ENCOUNTER — Telehealth: Payer: Self-pay | Admitting: *Deleted

## 2016-01-31 NOTE — Telephone Encounter (Signed)
-----   Message from Leonie Man, MD sent at 01/28/2016  1:58 PM EST ----- Lower extremity Dopplers: 01/27/2016 So it looks like she has brisk flow on the right leg. It shows stable flow with the exception of a found blood flow no in the previously thought to be excluded right posterior tibial artery. This is good news.  Please forward to:Kim Henrene Hawking, MD

## 2016-01-31 NOTE — Telephone Encounter (Signed)
LEFT MESSAGE TO CALL BACK

## 2016-02-07 ENCOUNTER — Encounter: Payer: Self-pay | Admitting: *Deleted

## 2016-02-07 NOTE — Telephone Encounter (Signed)
Mailed letter with results. 

## 2016-03-18 ENCOUNTER — Other Ambulatory Visit: Payer: Self-pay | Admitting: Internal Medicine

## 2016-05-23 ENCOUNTER — Other Ambulatory Visit (HOSPITAL_COMMUNITY): Payer: Self-pay | Admitting: *Deleted

## 2016-05-23 NOTE — Discharge Instructions (Signed)
Epoetin Alfa injection °What is this medicine? °EPOETIN ALFA (e POE e tin AL fa) helps your body make more red blood cells. This medicine is used to treat anemia caused by chronic kidney failure, cancer chemotherapy, or HIV-therapy. It may also be used before surgery if you have anemia. °This medicine may be used for other purposes; ask your health care provider or pharmacist if you have questions. °What should I tell my health care provider before I take this medicine? °They need to know if you have any of these conditions: °-blood clotting disorders °-cancer patient not on chemotherapy °-cystic fibrosis °-heart disease, such as angina or heart failure °-hemoglobin level of 12 g/dL or greater °-high blood pressure °-low levels of folate, iron, or vitamin B12 °-seizures °-an unusual or allergic reaction to erythropoietin, albumin, benzyl alcohol, hamster proteins, other medicines, foods, dyes, or preservatives °-pregnant or trying to get pregnant °-breast-feeding °How should I use this medicine? °This medicine is for injection into a vein or under the skin. It is usually given by a health care professional in a hospital or clinic setting. °If you get this medicine at home, you will be taught how to prepare and give this medicine. Use exactly as directed. Take your medicine at regular intervals. Do not take your medicine more often than directed. °It is important that you put your used needles and syringes in a special sharps container. Do not put them in a trash can. If you do not have a sharps container, call your pharmacist or healthcare provider to get one. °Talk to your pediatrician regarding the use of this medicine in children. While this drug may be prescribed for selected conditions, precautions do apply. °Overdosage: If you think you have taken too much of this medicine contact a poison control center or emergency room at once. °NOTE: This medicine is only for you. Do not share this medicine with  others. °What if I miss a dose? °If you miss a dose, take it as soon as you can. If it is almost time for your next dose, take only that dose. Do not take double or extra doses. °What may interact with this medicine? °Do not take this medicine with any of the following medications: °-darbepoetin alfa °This list may not describe all possible interactions. Give your health care provider a list of all the medicines, herbs, non-prescription drugs, or dietary supplements you use. Also tell them if you smoke, drink alcohol, or use illegal drugs. Some items may interact with your medicine. °What should I watch for while using this medicine? °Visit your prescriber or health care professional for regular checks on your progress and for the needed blood tests and blood pressure measurements. It is especially important for the doctor to make sure your hemoglobin level is in the desired range, to limit the risk of potential side effects and to give you the best benefit. Keep all appointments for any recommended tests. Check your blood pressure as directed. Ask your doctor what your blood pressure should be and when you should contact him or her. °As your body makes more red blood cells, you may need to take iron, folic acid, or vitamin B supplements. Ask your doctor or health care provider which products are right for you. If you have kidney disease continue dietary restrictions, even though this medication can make you feel better. Talk with your doctor or health care professional about the foods you eat and the vitamins that you take. °What side effects may I notice   from receiving this medicine? °Side effects that you should report to your doctor or health care professional as soon as possible: °-allergic reactions like skin rash, itching or hives, swelling of the face, lips, or tongue °-breathing problems °-changes in vision °-chest pain °-confusion, trouble speaking or understanding °-feeling faint or lightheaded,  falls °-high blood pressure °-muscle aches or pains °-pain, swelling, warmth in the leg °-rapid weight gain °-severe headaches °-sudden numbness or weakness of the face, arm or leg °-trouble walking, dizziness, loss of balance or coordination °-seizures (convulsions) °-swelling of the ankles, feet, hands °-unusually weak or tired °Side effects that usually do not require medical attention (report to your doctor or health care professional if they continue or are bothersome): °-diarrhea °-fever, chills (flu-like symptoms) °-headaches °-nausea, vomiting °-redness, stinging, or swelling at site where injected °This list may not describe all possible side effects. Call your doctor for medical advice about side effects. You may report side effects to FDA at 1-800-FDA-1088. °Where should I keep my medicine? °Keep out of the reach of children. °Store in a refrigerator between 2 and 8 degrees C (36 and 46 degrees F). Do not freeze or shake. Throw away any unused portion if using a single-dose vial. Multi-dose vials can be kept in the refrigerator for up to 21 days after the initial dose. Throw away unused medicine. °NOTE: This sheet is a summary. It may not cover all possible information. If you have questions about this medicine, talk to your doctor, pharmacist, or health care provider. °  °© 2016, [employer]vier/Gold Standard. (2008-10-19 10:25:44) °Ferumoxytol injection °What is this medicine? °FERUMOXYTOL is an iron complex. Iron is used to make healthy red blood cells, which carry oxygen and nutrients throughout the body. This medicine is used to treat iron deficiency anemia in people with chronic kidney disease. °This medicine may be used for other purposes; ask your health care provider or pharmacist if you have questions. °What should I tell my health care provider before I take this medicine? °They need to know if you have any of these conditions: °-anemia not caused by low iron levels °-high levels of iron in the  blood °-magnetic resonance imaging (MRI) test scheduled °-an unusual or allergic reaction to iron, other medicines, foods, dyes, or preservatives °-pregnant or trying to get pregnant °-breast-feeding °How should I use this medicine? °This medicine is for injection into a vein. It is given by a health care professional in a hospital or clinic setting. °Talk to your pediatrician regarding the use of this medicine in children. Special care may be needed. °Overdosage: If you think you have taken too much of this medicine contact a poison control center or emergency room at once. °NOTE: This medicine is only for you. Do not share this medicine with others. °What if I miss a dose? °It is important not to miss your dose. Call your doctor or health care professional if you are unable to keep an appointment. °What may interact with this medicine? °This medicine may interact with the following medications: °-other iron products °This list may not describe all possible interactions. Give your health care provider a list of all the medicines, herbs, non-prescription drugs, or dietary supplements you use. Also tell them if you smoke, drink alcohol, or use illegal drugs. Some items may interact with your medicine. °What should I watch for while using this medicine? °Visit your doctor or healthcare professional regularly. Tell your doctor or healthcare professional if your symptoms do not start to get better   or if they get worse. You may need blood work done while you are taking this medicine. °You may need to follow a special diet. Talk to your doctor. Foods that contain iron include: whole grains/cereals, dried fruits, beans, or peas, leafy green vegetables, and organ meats (liver, kidney). °What side effects may I notice from receiving this medicine? °Side effects that you should report to your doctor or health care professional as soon as possible: °-allergic reactions like skin rash, itching or hives, swelling of the face,  lips, or tongue °-breathing problems °-changes in blood pressure °-feeling faint or lightheaded, falls °-fever or chills °-flushing, sweating, or hot feelings °-swelling of the ankles or feet °Side effects that usually do not require medical attention (Report these to your doctor or health care professional if they continue or are bothersome.): °-diarrhea °-headache °-nausea, vomiting °-stomach pain °This list may not describe all possible side effects. Call your doctor for medical advice about side effects. You may report side effects to FDA at 1-800-FDA-1088. °Where should I keep my medicine? °This drug is given in a hospital or clinic and will not be stored at home. °NOTE: This sheet is a summary. It may not cover all possible information. If you have questions about this medicine, talk to your doctor, pharmacist, or health care provider. °  °© 2016, Elsevier/Gold Standard. (2012-06-20 15:23:36) ° °

## 2016-05-24 ENCOUNTER — Ambulatory Visit (HOSPITAL_COMMUNITY)
Admission: RE | Admit: 2016-05-24 | Discharge: 2016-05-24 | Disposition: A | Discharge status: Home / Self Care | Payer: Medicare Other | Source: Ambulatory Visit | Attending: Nephrology | Admitting: Nephrology

## 2016-05-24 DIAGNOSIS — D638 Anemia in other chronic diseases classified elsewhere: Secondary | ICD-10-CM | POA: Diagnosis present

## 2016-05-24 DIAGNOSIS — N184 Chronic kidney disease, stage 4 (severe): Secondary | ICD-10-CM | POA: Insufficient documentation

## 2016-05-24 LAB — POCT HEMOGLOBIN-HEMACUE: Hemoglobin: 8.3 g/dL — ABNORMAL LOW (ref 12.0–15.0)

## 2016-05-24 MED ORDER — EPOETIN ALFA 20000 UNIT/ML IJ SOLN
INTRAMUSCULAR | Status: AC
  Filled 2016-05-24: qty 1

## 2016-05-24 MED ORDER — EPOETIN ALFA 20000 UNIT/ML IJ SOLN
20000.0000 [IU] | INTRAMUSCULAR | Status: DC
  Administered 2016-05-24: 20000 [IU] via SUBCUTANEOUS

## 2016-05-24 MED ORDER — SODIUM CHLORIDE 0.9 % IV SOLN
510.0000 mg | INTRAVENOUS | Status: DC
  Administered 2016-05-24: 510 mg via INTRAVENOUS
  Filled 2016-05-24: qty 17

## 2016-06-07 ENCOUNTER — Ambulatory Visit (HOSPITAL_COMMUNITY): Payer: Medicare Other | Attending: Nephrology

## 2016-07-18 ENCOUNTER — Encounter: Payer: Self-pay | Admitting: Gastroenterology

## 2016-08-09 ENCOUNTER — Ambulatory Visit (HOSPITAL_COMMUNITY)
Admission: RE | Admit: 2016-08-09 | Discharge: 2016-08-09 | Disposition: A | Discharge status: Home / Self Care | Payer: Medicare Other | Source: Ambulatory Visit | Attending: Nephrology | Admitting: Nephrology

## 2016-08-09 DIAGNOSIS — N184 Chronic kidney disease, stage 4 (severe): Secondary | ICD-10-CM | POA: Insufficient documentation

## 2016-08-09 LAB — POCT HEMOGLOBIN-HEMACUE: Hemoglobin: 8.8 g/dL — ABNORMAL LOW (ref 12.0–15.0)

## 2016-08-09 MED ORDER — EPOETIN ALFA 20000 UNIT/ML IJ SOLN: 20000.0000 [IU] | INTRAMUSCULAR | Status: DC

## 2016-08-09 MED ORDER — SODIUM CHLORIDE 0.9 % IV SOLN
510.0000 mg | INTRAVENOUS | Status: DC
  Administered 2016-08-09: 510 mg via INTRAVENOUS
  Filled 2016-08-09: qty 17

## 2016-08-09 MED ORDER — EPOETIN ALFA 20000 UNIT/ML IJ SOLN
INTRAMUSCULAR | Status: AC
  Administered 2016-08-09: 20000 [IU] via SUBCUTANEOUS
  Filled 2016-08-09: qty 1

## 2016-08-09 MED ORDER — SODIUM CHLORIDE 0.9 % IV SOLN
INTRAVENOUS | Status: DC
  Administered 2016-08-09: 12:00:00 via INTRAVENOUS

## 2016-08-15 ENCOUNTER — Encounter (HOSPITAL_COMMUNITY): Payer: Self-pay

## 2016-08-15 ENCOUNTER — Ambulatory Visit (HOSPITAL_COMMUNITY)
Admission: RE | Admit: 2016-08-15 | Discharge: 2016-08-15 | Disposition: A | Discharge status: Home / Self Care | Payer: Medicare Other | Source: Ambulatory Visit | Attending: Nephrology | Admitting: Nephrology

## 2016-08-15 DIAGNOSIS — D638 Anemia in other chronic diseases classified elsewhere: Secondary | ICD-10-CM | POA: Diagnosis present

## 2016-08-15 DIAGNOSIS — N184 Chronic kidney disease, stage 4 (severe): Secondary | ICD-10-CM | POA: Insufficient documentation

## 2016-08-15 MED ORDER — SODIUM CHLORIDE 0.9 % IV SOLN: INTRAVENOUS | Status: DC

## 2016-08-15 MED ORDER — SODIUM CHLORIDE 0.9 % IV SOLN
510.0000 mg | INTRAVENOUS | Status: AC
  Administered 2016-08-15: 510 mg via INTRAVENOUS
  Filled 2016-08-15: qty 17

## 2016-08-16 ENCOUNTER — Encounter (HOSPITAL_COMMUNITY): Payer: Medicare Other

## 2016-08-23 ENCOUNTER — Encounter (HOSPITAL_COMMUNITY)
Admission: RE | Admit: 2016-08-23 | Discharge: 2016-08-23 | Disposition: A | Discharge status: Home / Self Care | Payer: Medicare Other | Source: Ambulatory Visit | Attending: Nephrology | Admitting: Nephrology

## 2016-08-23 DIAGNOSIS — D638 Anemia in other chronic diseases classified elsewhere: Secondary | ICD-10-CM | POA: Insufficient documentation

## 2016-08-23 DIAGNOSIS — N184 Chronic kidney disease, stage 4 (severe): Secondary | ICD-10-CM | POA: Diagnosis present

## 2016-08-23 LAB — RENAL FUNCTION PANEL
ANION GAP: 7 (ref 5–15)
Albumin: 3.1 g/dL — ABNORMAL LOW (ref 3.5–5.0)
BUN: 72 mg/dL — ABNORMAL HIGH (ref 6–20)
CO2: 26 mmol/L (ref 22–32)
Calcium: 9.7 mg/dL (ref 8.9–10.3)
Chloride: 109 mmol/L (ref 101–111)
Creatinine, Ser: 2.57 mg/dL — ABNORMAL HIGH (ref 0.44–1.00)
GFR calc Af Amer: 21 mL/min — ABNORMAL LOW (ref >60)
GFR calc non Af Amer: 18 mL/min — ABNORMAL LOW (ref >60)
GLUCOSE: 200 mg/dL — AB (ref 65–99)
POTASSIUM: 4.3 mmol/L (ref 3.5–5.1)
Phosphorus: 3.5 mg/dL (ref 2.5–4.6)
SODIUM: 142 mmol/L (ref 135–145)

## 2016-08-23 LAB — IRON AND TIBC
Iron: 62 ug/dL (ref 28–170)
SATURATION RATIOS: 24 % (ref 10.4–31.8)
TIBC: 263 ug/dL (ref 250–450)
UIBC: 201 ug/dL

## 2016-08-23 LAB — POCT HEMOGLOBIN-HEMACUE: HEMOGLOBIN: 8.5 g/dL — AB (ref 12.0–15.0)

## 2016-08-23 LAB — FERRITIN: FERRITIN: 1474 ng/mL — AB (ref 11–307)

## 2016-08-23 MED ORDER — EPOETIN ALFA 20000 UNIT/ML IJ SOLN
20000.0000 [IU] | INTRAMUSCULAR | Status: DC
  Administered 2016-08-23: 20000 [IU] via SUBCUTANEOUS

## 2016-08-23 MED ORDER — EPOETIN ALFA 20000 UNIT/ML IJ SOLN
INTRAMUSCULAR | Status: AC
  Filled 2016-08-23: qty 1

## 2016-08-24 LAB — PTH, INTACT AND CALCIUM
Calcium, Total (PTH): 9.4 mg/dL (ref 8.7–10.3)
PTH: 120 pg/mL — AB (ref 15–65)

## 2016-08-30 ENCOUNTER — Inpatient Hospital Stay (HOSPITAL_COMMUNITY): Admission: RE | Admit: 2016-08-30 | Payer: Medicare Other | Source: Ambulatory Visit

## 2016-09-06 ENCOUNTER — Encounter (HOSPITAL_COMMUNITY): Payer: Medicare Other

## 2016-09-07 ENCOUNTER — Encounter (HOSPITAL_COMMUNITY)
Admission: RE | Admit: 2016-09-07 | Discharge: 2016-09-07 | Disposition: A | Discharge status: Home / Self Care | Payer: Medicare Other | Source: Ambulatory Visit | Attending: Nephrology | Admitting: Nephrology

## 2016-09-07 DIAGNOSIS — N184 Chronic kidney disease, stage 4 (severe): Secondary | ICD-10-CM | POA: Diagnosis not present

## 2016-09-07 LAB — POCT HEMOGLOBIN-HEMACUE: Hemoglobin: 7.8 g/dL — ABNORMAL LOW (ref 12.0–15.0)

## 2016-09-07 MED ORDER — EPOETIN ALFA 20000 UNIT/ML IJ SOLN
INTRAMUSCULAR | Status: AC
  Administered 2016-09-07: 20000 [IU] via SUBCUTANEOUS
  Filled 2016-09-07: qty 1

## 2016-09-07 MED ORDER — EPOETIN ALFA 20000 UNIT/ML IJ SOLN: 20000.0000 [IU] | INTRAMUSCULAR | Status: DC

## 2016-09-07 NOTE — Progress Notes (Signed)
Pt here today for Procrit injection, HGB 7.8; left message for [name] at [facility]; pt denies blood in stool

## 2016-09-13 ENCOUNTER — Other Ambulatory Visit (HOSPITAL_COMMUNITY): Payer: Self-pay | Admitting: *Deleted

## 2016-09-14 ENCOUNTER — Ambulatory Visit (HOSPITAL_COMMUNITY)
Admission: RE | Admit: 2016-09-14 | Discharge: 2016-09-14 | Disposition: A | Discharge status: Home / Self Care | Payer: Medicare Other | Source: Ambulatory Visit | Attending: Nephrology | Admitting: Nephrology

## 2016-09-14 DIAGNOSIS — D638 Anemia in other chronic diseases classified elsewhere: Secondary | ICD-10-CM | POA: Diagnosis present

## 2016-09-14 DIAGNOSIS — N184 Chronic kidney disease, stage 4 (severe): Secondary | ICD-10-CM | POA: Diagnosis present

## 2016-09-14 LAB — POCT HEMOGLOBIN-HEMACUE: Hemoglobin: 7.9 g/dL — ABNORMAL LOW (ref 12.0–15.0)

## 2016-09-14 MED ORDER — EPOETIN ALFA 20000 UNIT/ML IJ SOLN
INTRAMUSCULAR | Status: AC
  Administered 2016-09-14: 20000 [IU]
  Filled 2016-09-14: qty 1

## 2016-09-14 MED ORDER — SODIUM CHLORIDE 0.9 % IV SOLN
510.0000 mg | INTRAVENOUS | Status: DC
  Administered 2016-09-14: 510 mg via INTRAVENOUS
  Filled 2016-09-14: qty 17

## 2016-09-14 MED ORDER — EPOETIN ALFA 20000 UNIT/ML IJ SOLN: 20000.0000 [IU] | INTRAMUSCULAR | Status: DC

## 2016-09-14 NOTE — Progress Notes (Signed)
Discharged via wheelchair with daughter. No complaints. No issues.  VSS.

## 2016-09-20 ENCOUNTER — Encounter (HOSPITAL_COMMUNITY): Payer: Self-pay

## 2016-09-20 ENCOUNTER — Emergency Department (HOSPITAL_COMMUNITY)
Admission: EM | Admit: 2016-09-20 | Discharge: 2016-09-20 | Disposition: A | Discharge status: Home / Self Care | Payer: Medicare Other | Attending: Emergency Medicine | Admitting: Emergency Medicine

## 2016-09-20 ENCOUNTER — Emergency Department (HOSPITAL_COMMUNITY): Payer: Medicare Other

## 2016-09-20 DIAGNOSIS — Z7982 Long term (current) use of aspirin: Secondary | ICD-10-CM | POA: Diagnosis not present

## 2016-09-20 DIAGNOSIS — M549 Dorsalgia, unspecified: Secondary | ICD-10-CM | POA: Diagnosis not present

## 2016-09-20 DIAGNOSIS — Z794 Long term (current) use of insulin: Secondary | ICD-10-CM | POA: Diagnosis not present

## 2016-09-20 DIAGNOSIS — Z87891 Personal history of nicotine dependence: Secondary | ICD-10-CM | POA: Insufficient documentation

## 2016-09-20 DIAGNOSIS — M25551 Pain in right hip: Secondary | ICD-10-CM

## 2016-09-20 DIAGNOSIS — Z859 Personal history of malignant neoplasm, unspecified: Secondary | ICD-10-CM | POA: Insufficient documentation

## 2016-09-20 DIAGNOSIS — I130 Hypertensive heart and chronic kidney disease with heart failure and stage 1 through stage 4 chronic kidney disease, or unspecified chronic kidney disease: Secondary | ICD-10-CM | POA: Insufficient documentation

## 2016-09-20 DIAGNOSIS — N184 Chronic kidney disease, stage 4 (severe): Secondary | ICD-10-CM | POA: Insufficient documentation

## 2016-09-20 DIAGNOSIS — I2510 Atherosclerotic heart disease of native coronary artery without angina pectoris: Secondary | ICD-10-CM | POA: Insufficient documentation

## 2016-09-20 DIAGNOSIS — Z79899 Other long term (current) drug therapy: Secondary | ICD-10-CM | POA: Diagnosis not present

## 2016-09-20 DIAGNOSIS — I5032 Chronic diastolic (congestive) heart failure: Secondary | ICD-10-CM | POA: Insufficient documentation

## 2016-09-20 DIAGNOSIS — E1122 Type 2 diabetes mellitus with diabetic chronic kidney disease: Secondary | ICD-10-CM | POA: Insufficient documentation

## 2016-09-20 MED ORDER — METAXALONE 400 MG HALF TABLET
400.0000 mg | ORAL_TABLET | Freq: Once | ORAL | Status: AC
  Administered 2016-09-20: 400 mg via ORAL
  Filled 2016-09-20: qty 1

## 2016-09-20 MED ORDER — METAXALONE 800 MG PO TABS: 400.0000 mg | ORAL_TABLET | Freq: Three times a day (TID) | ORAL | 0 refills | Status: DC

## 2016-09-20 NOTE — ED Provider Notes (Signed)
[facility] DEPT Provider Note   CSN: [id number] Arrival date & time: 09/20/16  0305     History   Chief Complaint Chief Complaint  Patient presents with  . Hip Pain    HPI [name] is a 65 y.o. female.  HPI Patient presents with right hip/buttock pain. States she's had it for the last 3 days. Worse with movements. States she had pain a few months ago that resolved on its own. States she did not see anyone for it. No fevers or chills. No dysuria. No abdominal pain. No trauma. She has had a previous left below the knee amputation.    Past Medical History:  Diagnosis Date  . Anemia   . CAD S/P percutaneous coronary angioplasty 5/110    status post PCI to the RCA for inferior STEMI  . Cancer ([place])    endo ca  . Chronic diastolic heart failure, NYHA class 2 ([place])    Echocardiogram 06/2014:  Technically difficult study. Normal LV size with mild LVH. EF 55-60%. Moderate diastolic dysfunction, normal RV size and function. Likely aortic sclerosis without stenosis  . CKD (chronic kidney disease), stage IV ([place])    Recent acute on chronic exacerbation in July 2014  . Diabetes mellitus, type II, insulin dependent ([place])    With complications - CAD, CVA, peripheral ulcer ,  . Diabetic foot ulcer ([place])   . Diabetic peripheral neuropathy associated with type 2 diabetes mellitus ([place])   . Dry gangrene (HCC)    Left second toe; now on Sole of L foot  . Hyperlipidemia LDL goal <70   . Hypertension associated with diabetes ([place])   . Obesity, Class III, BMI 40-49.9 (morbid obesity) (HCC)    BMI 43; 5' 2',  235 pounds 6.4 ounces  . PAD (peripheral artery disease) ([place])     -- Most recent Dopplers April 2016 Status post left BKA. Known right PTA occlusion  . ST elevation myocardial infarction (STEMI) of inferior wall ([place])  03/2009   Ostial/proximal RCA occlusion treated with BMS stent  . Stroke/cerebrovascular accident [facility])  4/30/ 2014   Right-sided weakness, mostly with balance  issues and only mild weakness.    Patient Active Problem List   Diagnosis Date Noted  . S/P BKA (below knee amputation) unilateral ([place]) 08/06/2014  . Chronic respiratory failure with hypoxia ([place]) 08/06/2014  . Essential hypertension 06/23/2014  . Chronic diastolic congestive heart failure, NYHA class 2 ([place]) 01/12/2014  . UTI (lower urinary tract infection) 01/11/2014  . Anemia in chronic kidney disease(285.21) 09/25/2013  . Hyponatremia 08/06/2013  . Chronic kidney disease (CKD), stage IV (severe) ([place]) 08/06/2013  . Severe obesity (BMI >= 40) (HCC) 06/22/2013    Class: Chronic  . Peripheral arterial disease: s/p L BKA; Occluded R Pop A, 2 V runnoff 05/01/2013  . Hyperlipidemia LDL goal <70   . Hypertension associated with diabetes ([place])   . Unspecified constipation 04/16/2013  . Type 2 diabetes mellitus with renal manifestations ([place]) 04/16/2013  . Secondary renovascular hypertension, benign 04/09/2013  . Nausea and vomiting 03/18/2013  . Anemia of chronic disease 03/18/2013  . CVA (cerebral infarction) 03/18/2013  . Non-ketotic hyperosmolar coma ([place]) 03/18/2013  . CAD S/P PCI TO RCA for Inferior STEMI. BMS 03/31/2009  . Status post myocardial infarction of inferior wall 03/31/2009    Past Surgical History:  Procedure Laterality Date  . ABDOMINAL HYSTERECTOMY    . AMPUTATION Left 08/07/2013   Procedure: left midfoot amputation;  Surgeon: Marianna Payment, MD;  Location: [facility];  Service: Orthopedics;  Laterality: Left;  . AMPUTATION Left 08/09/2013   Procedure: AMPUTATION BELOW KNEE;  Surgeon: Marianna Payment, MD;  Location: [facility];  Service: Orthopedics;  Laterality: Left;  . AV FISTULA PLACEMENT Left 12/03/2014   Procedure: ARTERIOVENOUS (AV) FISTULA CREATION;  Surgeon: Angelia Mould, MD;  Location: [facility];  Service: Vascular;  Laterality: Left;  . CARDIAC CATHETERIZATION  03/31/2009   Proximal RCA thrombotic occlusion (inferior STEMI) other coronaries but in a  codominant system  . LEG SURGERY     hole in bone( during Childhood)  . LEG TENDON SURGERY     patient fell in a store right leg  . PERCUTANEOUS CORONARY STENT INTERVENTION (PCI-S)  03/31/2009   PTCA , bare-metal stent 3.5 mm x 24 mm ostium of RCA ,EF 55%  . TRANSTHORACIC ECHOCARDIOGRAM  06/2014   Technically difficult study. Normal LV size with mild LVH. EF 55-60%. Moderate - grade 2  diastolic dysfunction, normal RV size and function. Likely aortic sclerosis without stenosis    OB History    No data available       Home Medications    Prior to Admission medications   Medication Sig Start Date End Date Taking? Authorizing Provider  aspirin 81 MG tablet Take 81 mg by mouth daily.    Historical Provider, MD  atorvastatin (LIPITOR) 80 MG tablet Take 1 tablet (80 mg total) by mouth daily at 6 PM. Patient taking differently: Take 40 mg by mouth daily at 6 PM.  12/07/14   Thurnell Lose, MD  carvedilol (COREG) 25 MG tablet Take 25 mg by mouth. 11/23/14   Historical Provider, MD  ferrous sulfate 325 (65 FE) MG tablet Take 325 mg by mouth daily with breakfast.    Historical Provider, MD  furosemide (LASIX) 40 MG tablet Take 80 mg by mouth 2 (two) times daily.  08/10/14   Verlee Monte, MD  glucose blood (ACCU-CHEK AVIVA PLUS) test strip USE ONE STRIP TO CHECK GLUCOSE BEFORE MEALS AND AT BEDTIME 06/18/14   Historical Provider, MD  insulin glargine (LANTUS) 100 UNIT/ML injection Inject 0.45 mLs (45 Units total) into the skin at bedtime. 09/15/14   Shanker Kristeen Mans, MD  insulin lispro (HUMALOG) 100 UNIT/ML injection Inject into the skin once. Sliding scale insulin CBG 70 - 120: 0 units: CBG 121 - 150: 2 units; CBG 151 - 200: 3 units; CBG 201 - 250: 5 units; CBG 251 - 300: 8 units;CBG 301 - 350: 11 units; CBG 351 - 400: 15 units; CBG > 400 : 15 units and notify your doctor    Historical Provider, MD  Insulin Pen Needle 31G X 8 MM MISC USE TO ADMINISTER INSULIN VIA LANTUS AND NOVOLOG 4 TIMES DAILY  08/30/14   Historical Provider, MD  metaxalone (SKELAXIN) 800 MG tablet Take 0.5-1 tablets (400-800 mg total) by mouth 3 (three) times daily. 09/20/16   Davonna Belling, MD  nystatin-triamcinolone ointment [facility]) Apply 1 application topically 2 (two) times daily as needed (rash).     Historical Provider, MD  Omega-3 Fatty Acids (FISH OIL) 1000 MG CAPS Take 1 capsule by mouth 2 (two) times daily.    Historical Provider, MD  oxyCODONE (ROXICODONE) 5 MG immediate release tablet Take 1 tablet (5 mg total) by mouth every 6 (six) hours as needed for moderate pain. 12/03/14   Samantha J Rhyne, PA-C  OXYGEN Inhale 2.5 L into the lungs as needed.     Historical Provider, MD  pregabalin (LYRICA) 75 MG capsule Take 75 mg by mouth 2 (two) times daily.    Historical Provider, MD    Family History Family History  Problem Relation Age of Onset  . Alcoholism Mother     Died from complications  . Alcoholism Father     Died from complications  . Cancer Father     Throat  . Pneumonia Father   . Diabetes Father   . Hypertension Father   . Heart disease      Unknown, unclear. Patient is not a good historian.  Essentially no pertinent history known    Social History Social History  Substance Use Topics  . Smoking status: Former Smoker    Quit date: 04/29/1995  . Smokeless tobacco: Never Used  . Alcohol use No     Allergies   Review of patient's allergies indicates no known allergies.   Review of Systems Review of Systems  Constitutional: Negative for appetite change.  HENT: Negative for congestion.   Respiratory: Negative for shortness of breath.   Gastrointestinal: Negative for abdominal pain.  Genitourinary: Negative for dysuria.  Musculoskeletal: Positive for back pain. Negative for joint swelling.  Skin: Negative for wound.  Neurological: Negative for weakness.  Psychiatric/Behavioral: Negative for confusion.     Physical Exam Updated Vital Signs BP 149/64 (BP Location: Left  Arm)   Pulse 68   Temp 98.5 F (36.9 C) (Oral)   Resp 16   SpO2 94%   Physical Exam  Constitutional: She appears well-developed.  HENT:  Head: Atraumatic.  Neck: Neck supple.  Cardiovascular: Normal rate.   Pulmonary/Chest: Effort normal.  Abdominal: There is no tenderness.  Musculoskeletal:  No tenderness to lumbar spine. No tenderness over hip. Some mild pain with movement of right hip. Left below-the-knee amputation. No rash. Hip is not irritable.  Neurological: She is alert.  Skin: Skin is warm.  Psychiatric: She has a normal mood and affect.     ED Treatments / Results  Labs (all labs ordered are listed, but only abnormal results are displayed) Labs Reviewed - No data to display  EKG  EKG Interpretation None       Radiology Dg Hip Unilat  With Pelvis 2-3 Views Right  Result Date: 09/20/2016 CLINICAL DATA:  Worsening nontraumatic right hip pain EXAM: DG HIP (WITH OR WITHOUT PELVIS) 2-3V RIGHT COMPARISON:  None. FINDINGS: There is no evidence of hip fracture or dislocation. There is no evidence of arthropathy or other focal bone abnormality. IMPRESSION: Negative. Electronically Signed   By: Andreas Newport M.D.   On: 09/20/2016 06:28    Procedures Procedures (including critical care time)  Medications Ordered in ED Medications  metaxalone (SKELAXIN) tablet 400 mg (not administered)     Initial Impression / Assessment and Plan / ED Course  I have reviewed the triage vital signs and the nursing notes.  Pertinent labs & imaging results that were available during my care of the patient were reviewed by me and considered in my medical decision making (see chart for details).  Clinical Course  Patient with right hip pain. No rash. Not irritable joint. No abdominal pain. Negative x-ray. Will discharge home.   Final diagnoses:  Acute pain of right hip    New Prescriptions New Prescriptions   METAXALONE (SKELAXIN) 800 MG TABLET    Take 0.5-1 tablets  (400-800 mg total) by mouth 3 (three) times daily.     Davonna Belling, MD 09/20/16 [phone]

## 2016-09-20 NOTE — ED Notes (Signed)
Daughter states that her right hip has been hurting for awhile and the pain is worse when they try to move her,   Pt states that it feels like it's pulling apart

## 2016-09-20 NOTE — ED Triage Notes (Signed)
Pt complains of hip pain, no injury

## 2016-09-21 ENCOUNTER — Ambulatory Visit (HOSPITAL_COMMUNITY): Payer: Medicare Other | Attending: Nephrology

## 2016-09-21 ENCOUNTER — Encounter (HOSPITAL_COMMUNITY): Payer: Medicare Other

## 2016-09-26 ENCOUNTER — Ambulatory Visit: Payer: Medicare Other | Admitting: Gastroenterology

## 2016-10-04 ENCOUNTER — Other Ambulatory Visit (HOSPITAL_COMMUNITY): Payer: Self-pay | Admitting: *Deleted

## 2016-10-05 ENCOUNTER — Encounter (HOSPITAL_COMMUNITY)
Admission: RE | Admit: 2016-10-05 | Discharge: 2016-10-05 | Disposition: A | Discharge status: Home / Self Care | Payer: Medicare Other | Source: Ambulatory Visit | Attending: Nephrology | Admitting: Nephrology

## 2016-10-05 DIAGNOSIS — N184 Chronic kidney disease, stage 4 (severe): Secondary | ICD-10-CM

## 2016-10-05 DIAGNOSIS — D638 Anemia in other chronic diseases classified elsewhere: Secondary | ICD-10-CM | POA: Insufficient documentation

## 2016-10-05 LAB — POCT HEMOGLOBIN-HEMACUE: HEMOGLOBIN: 9.6 g/dL — AB (ref 12.0–15.0)

## 2016-10-05 LAB — RENAL FUNCTION PANEL
ANION GAP: 10 (ref 5–15)
Albumin: 3.2 g/dL — ABNORMAL LOW (ref 3.5–5.0)
BUN: 104 mg/dL — ABNORMAL HIGH (ref 6–20)
CALCIUM: 9.5 mg/dL (ref 8.9–10.3)
CHLORIDE: 105 mmol/L (ref 101–111)
CO2: 23 mmol/L (ref 22–32)
CREATININE: 3.24 mg/dL — AB (ref 0.44–1.00)
GFR, EST AFRICAN AMERICAN: 16 mL/min — AB (ref >60)
GFR, EST NON AFRICAN AMERICAN: 14 mL/min — AB (ref >60)
Glucose, Bld: 351 mg/dL — ABNORMAL HIGH (ref 65–99)
Phosphorus: 4.4 mg/dL (ref 2.5–4.6)
Potassium: 4.7 mmol/L (ref 3.5–5.1)
Sodium: 138 mmol/L (ref 135–145)

## 2016-10-05 LAB — IRON AND TIBC
IRON: 56 ug/dL (ref 28–170)
SATURATION RATIOS: 22 % (ref 10.4–31.8)
TIBC: 251 ug/dL (ref 250–450)
UIBC: 195 ug/dL

## 2016-10-05 LAB — FERRITIN: FERRITIN: 1195 ng/mL — AB (ref 11–307)

## 2016-10-05 MED ORDER — EPOETIN ALFA 20000 UNIT/ML IJ SOLN
INTRAMUSCULAR | Status: AC
  Administered 2016-10-05: 20000 [IU] via SUBCUTANEOUS
  Filled 2016-10-05: qty 1

## 2016-10-05 MED ORDER — EPOETIN ALFA 20000 UNIT/ML IJ SOLN
20000.0000 [IU] | INTRAMUSCULAR | Status: DC
  Administered 2016-10-05: 20000 [IU] via SUBCUTANEOUS

## 2016-10-05 MED ORDER — FERUMOXYTOL INJECTION 510 MG/17 ML
510.0000 mg | INTRAVENOUS | Status: AC
  Administered 2016-10-05: 510 mg via INTRAVENOUS
  Filled 2016-10-05: qty 17

## 2016-10-06 LAB — PTH, INTACT AND CALCIUM
CALCIUM TOTAL (PTH): 9.2 mg/dL (ref 8.7–10.3)
PTH: 154 pg/mL — AB (ref 15–65)

## 2016-10-12 ENCOUNTER — Encounter (HOSPITAL_COMMUNITY)
Admission: RE | Admit: 2016-10-12 | Discharge: 2016-10-12 | Disposition: A | Discharge status: Home / Self Care | Payer: Medicare Other | Source: Ambulatory Visit | Attending: Nephrology | Admitting: Nephrology

## 2016-10-12 DIAGNOSIS — N184 Chronic kidney disease, stage 4 (severe): Secondary | ICD-10-CM

## 2016-10-12 LAB — POCT HEMOGLOBIN-HEMACUE: Hemoglobin: 9.9 g/dL — ABNORMAL LOW (ref 12.0–15.0)

## 2016-10-12 MED ORDER — EPOETIN ALFA 20000 UNIT/ML IJ SOLN: 20000.0000 [IU] | INTRAMUSCULAR | Status: DC

## 2016-10-12 MED ORDER — EPOETIN ALFA 20000 UNIT/ML IJ SOLN
INTRAMUSCULAR | Status: AC
  Administered 2016-10-12: 20000 [IU] via SUBCUTANEOUS
  Filled 2016-10-12: qty 1

## 2016-10-19 ENCOUNTER — Encounter (HOSPITAL_COMMUNITY)
Admission: RE | Admit: 2016-10-19 | Discharge: 2016-10-19 | Disposition: A | Discharge status: Home / Self Care | Payer: Medicare Other | Source: Ambulatory Visit | Attending: Nephrology | Admitting: Nephrology

## 2016-10-19 DIAGNOSIS — N184 Chronic kidney disease, stage 4 (severe): Secondary | ICD-10-CM | POA: Insufficient documentation

## 2016-10-19 DIAGNOSIS — D638 Anemia in other chronic diseases classified elsewhere: Secondary | ICD-10-CM | POA: Insufficient documentation

## 2016-10-19 LAB — POCT HEMOGLOBIN-HEMACUE: HEMOGLOBIN: 10.2 g/dL — AB (ref 12.0–15.0)

## 2016-10-19 MED ORDER — EPOETIN ALFA 20000 UNIT/ML IJ SOLN: 20000.0000 [IU] | INTRAMUSCULAR | Status: DC

## 2016-10-19 MED ORDER — EPOETIN ALFA 20000 UNIT/ML IJ SOLN
INTRAMUSCULAR | Status: AC
  Administered 2016-10-19: 20000 [IU] via SUBCUTANEOUS
  Filled 2016-10-19: qty 1

## 2016-10-26 ENCOUNTER — Inpatient Hospital Stay (HOSPITAL_COMMUNITY): Admission: RE | Admit: 2016-10-26 | Payer: Medicare Other | Source: Ambulatory Visit

## 2016-10-29 ENCOUNTER — Other Ambulatory Visit: Payer: Self-pay | Admitting: Internal Medicine

## 2016-11-06 ENCOUNTER — Encounter (HOSPITAL_COMMUNITY)
Admission: RE | Admit: 2016-11-06 | Discharge: 2016-11-06 | Disposition: A | Discharge status: Home / Self Care | Payer: Medicare Other | Source: Ambulatory Visit | Attending: Nephrology | Admitting: Nephrology

## 2016-11-06 DIAGNOSIS — N184 Chronic kidney disease, stage 4 (severe): Secondary | ICD-10-CM

## 2016-11-06 LAB — RENAL FUNCTION PANEL
ALBUMIN: 3 g/dL — AB (ref 3.5–5.0)
ANION GAP: 8 (ref 5–15)
BUN: 105 mg/dL — AB (ref 6–20)
CALCIUM: 9.6 mg/dL (ref 8.9–10.3)
CO2: 24 mmol/L (ref 22–32)
Chloride: 105 mmol/L (ref 101–111)
Creatinine, Ser: 3.79 mg/dL — ABNORMAL HIGH (ref 0.44–1.00)
GFR calc Af Amer: 13 mL/min — ABNORMAL LOW (ref >60)
GFR, EST NON AFRICAN AMERICAN: 12 mL/min — AB (ref >60)
GLUCOSE: 365 mg/dL — AB (ref 65–99)
PHOSPHORUS: 4.1 mg/dL (ref 2.5–4.6)
POTASSIUM: 4.6 mmol/L (ref 3.5–5.1)
SODIUM: 137 mmol/L (ref 135–145)

## 2016-11-06 LAB — FERRITIN: Ferritin: 1164 ng/mL — ABNORMAL HIGH (ref 11–307)

## 2016-11-06 LAB — IRON AND TIBC
IRON: 69 ug/dL (ref 28–170)
SATURATION RATIOS: 29 % (ref 10.4–31.8)
TIBC: 238 ug/dL — ABNORMAL LOW (ref 250–450)
UIBC: 169 ug/dL

## 2016-11-06 LAB — POCT HEMOGLOBIN-HEMACUE: Hemoglobin: 9.9 g/dL — ABNORMAL LOW (ref 12.0–15.0)

## 2016-11-06 MED ORDER — EPOETIN ALFA 20000 UNIT/ML IJ SOLN
INTRAMUSCULAR | Status: AC
  Administered 2016-11-06: 20000 [IU] via SUBCUTANEOUS
  Filled 2016-11-06: qty 1

## 2016-11-06 MED ORDER — EPOETIN ALFA 20000 UNIT/ML IJ SOLN: 20000.0000 [IU] | INTRAMUSCULAR | Status: DC

## 2016-11-07 LAB — PTH, INTACT AND CALCIUM
Calcium, Total (PTH): 9.4 mg/dL (ref 8.7–10.3)
PTH: 145 pg/mL — ABNORMAL HIGH (ref 15–65)

## 2016-11-13 ENCOUNTER — Encounter (HOSPITAL_COMMUNITY): Payer: Medicare Other

## 2016-12-05 ENCOUNTER — Inpatient Hospital Stay (HOSPITAL_COMMUNITY)
Admission: RE | Admit: 2016-12-05 | Discharge: 2016-12-05 | Disposition: A | Discharge status: Home / Self Care | Payer: Medicare Other | Source: Ambulatory Visit | Attending: Nephrology | Admitting: Nephrology

## 2016-12-10 ENCOUNTER — Inpatient Hospital Stay (HOSPITAL_COMMUNITY): Admission: RE | Admit: 2016-12-10 | Payer: Medicare Other | Source: Ambulatory Visit

## 2016-12-14 ENCOUNTER — Encounter (HOSPITAL_COMMUNITY)
Admission: RE | Admit: 2016-12-14 | Discharge: 2016-12-14 | Disposition: A | Discharge status: Home / Self Care | Payer: Medicare Other | Source: Ambulatory Visit | Attending: Nephrology | Admitting: Nephrology

## 2016-12-14 DIAGNOSIS — N184 Chronic kidney disease, stage 4 (severe): Secondary | ICD-10-CM | POA: Diagnosis present

## 2016-12-14 DIAGNOSIS — D638 Anemia in other chronic diseases classified elsewhere: Secondary | ICD-10-CM | POA: Insufficient documentation

## 2016-12-14 LAB — RENAL FUNCTION PANEL
ALBUMIN: 3 g/dL — AB (ref 3.5–5.0)
ANION GAP: 9 (ref 5–15)
BUN: 79 mg/dL — AB (ref 6–20)
CO2: 26 mmol/L (ref 22–32)
Calcium: 10.2 mg/dL (ref 8.9–10.3)
Chloride: 104 mmol/L (ref 101–111)
Creatinine, Ser: 3.32 mg/dL — ABNORMAL HIGH (ref 0.44–1.00)
GFR calc Af Amer: 16 mL/min — ABNORMAL LOW (ref >60)
GFR calc non Af Amer: 14 mL/min — ABNORMAL LOW (ref >60)
GLUCOSE: 238 mg/dL — AB (ref 65–99)
PHOSPHORUS: 3.7 mg/dL (ref 2.5–4.6)
POTASSIUM: 4.9 mmol/L (ref 3.5–5.1)
Sodium: 139 mmol/L (ref 135–145)

## 2016-12-14 LAB — FERRITIN: FERRITIN: 1612 ng/mL — AB (ref 11–307)

## 2016-12-14 LAB — IRON AND TIBC
Iron: 76 ug/dL (ref 28–170)
SATURATION RATIOS: 29 % (ref 10.4–31.8)
TIBC: 263 ug/dL (ref 250–450)
UIBC: 187 ug/dL

## 2016-12-14 LAB — POCT HEMOGLOBIN-HEMACUE: HEMOGLOBIN: 10.1 g/dL — AB (ref 12.0–15.0)

## 2016-12-14 MED ORDER — EPOETIN ALFA 20000 UNIT/ML IJ SOLN
INTRAMUSCULAR | Status: AC
  Filled 2016-12-14: qty 1

## 2016-12-14 MED ORDER — EPOETIN ALFA 20000 UNIT/ML IJ SOLN
20000.0000 [IU] | INTRAMUSCULAR | Status: DC
  Administered 2016-12-14: 20000 [IU] via SUBCUTANEOUS

## 2016-12-15 LAB — PTH, INTACT AND CALCIUM
Calcium, Total (PTH): 10.1 mg/dL (ref 8.7–10.3)
PTH: 117 pg/mL — ABNORMAL HIGH (ref 15–65)

## 2016-12-19 ENCOUNTER — Observation Stay (HOSPITAL_COMMUNITY): Payer: Medicare Other

## 2016-12-19 ENCOUNTER — Encounter (HOSPITAL_COMMUNITY): Payer: Self-pay | Admitting: Family Medicine

## 2016-12-19 ENCOUNTER — Observation Stay (HOSPITAL_COMMUNITY)
Admission: EM | Admit: 2016-12-19 | Discharge: 2016-12-20 | Disposition: A | Discharge status: Home / Self Care | Payer: Medicare Other | Attending: Student in an Organized Health Care Education/Training Program | Admitting: Student in an Organized Health Care Education/Training Program

## 2016-12-19 ENCOUNTER — Emergency Department (HOSPITAL_COMMUNITY): Payer: Medicare Other

## 2016-12-19 DIAGNOSIS — R4781 Slurred speech: Secondary | ICD-10-CM | POA: Diagnosis not present

## 2016-12-19 DIAGNOSIS — E1122 Type 2 diabetes mellitus with diabetic chronic kidney disease: Secondary | ICD-10-CM | POA: Insufficient documentation

## 2016-12-19 DIAGNOSIS — I63412 Cerebral infarction due to embolism of left middle cerebral artery: Principal | ICD-10-CM | POA: Insufficient documentation

## 2016-12-19 DIAGNOSIS — I639 Cerebral infarction, unspecified: Secondary | ICD-10-CM | POA: Diagnosis present

## 2016-12-19 DIAGNOSIS — E1151 Type 2 diabetes mellitus with diabetic peripheral angiopathy without gangrene: Secondary | ICD-10-CM | POA: Insufficient documentation

## 2016-12-19 DIAGNOSIS — E1142 Type 2 diabetes mellitus with diabetic polyneuropathy: Secondary | ICD-10-CM | POA: Insufficient documentation

## 2016-12-19 DIAGNOSIS — Z794 Long term (current) use of insulin: Secondary | ICD-10-CM | POA: Insufficient documentation

## 2016-12-19 DIAGNOSIS — Z955 Presence of coronary angioplasty implant and graft: Secondary | ICD-10-CM | POA: Diagnosis not present

## 2016-12-19 DIAGNOSIS — Z9981 Dependence on supplemental oxygen: Secondary | ICD-10-CM | POA: Diagnosis not present

## 2016-12-19 DIAGNOSIS — I252 Old myocardial infarction: Secondary | ICD-10-CM | POA: Diagnosis not present

## 2016-12-19 DIAGNOSIS — Z8673 Personal history of transient ischemic attack (TIA), and cerebral infarction without residual deficits: Secondary | ICD-10-CM | POA: Insufficient documentation

## 2016-12-19 DIAGNOSIS — I5032 Chronic diastolic (congestive) heart failure: Secondary | ICD-10-CM | POA: Insufficient documentation

## 2016-12-19 DIAGNOSIS — N189 Chronic kidney disease, unspecified: Secondary | ICD-10-CM

## 2016-12-19 DIAGNOSIS — G8929 Other chronic pain: Secondary | ICD-10-CM | POA: Insufficient documentation

## 2016-12-19 DIAGNOSIS — F329 Major depressive disorder, single episode, unspecified: Secondary | ICD-10-CM | POA: Insufficient documentation

## 2016-12-19 DIAGNOSIS — N184 Chronic kidney disease, stage 4 (severe): Secondary | ICD-10-CM | POA: Diagnosis not present

## 2016-12-19 DIAGNOSIS — E1129 Type 2 diabetes mellitus with other diabetic kidney complication: Secondary | ICD-10-CM | POA: Diagnosis present

## 2016-12-19 DIAGNOSIS — D631 Anemia in chronic kidney disease: Secondary | ICD-10-CM | POA: Insufficient documentation

## 2016-12-19 DIAGNOSIS — I130 Hypertensive heart and chronic kidney disease with heart failure and stage 1 through stage 4 chronic kidney disease, or unspecified chronic kidney disease: Secondary | ICD-10-CM | POA: Insufficient documentation

## 2016-12-19 DIAGNOSIS — Z79899 Other long term (current) drug therapy: Secondary | ICD-10-CM | POA: Insufficient documentation

## 2016-12-19 DIAGNOSIS — M549 Dorsalgia, unspecified: Secondary | ICD-10-CM | POA: Diagnosis not present

## 2016-12-19 DIAGNOSIS — Z6841 Body Mass Index (BMI) 40.0 and over, adult: Secondary | ICD-10-CM | POA: Diagnosis not present

## 2016-12-19 DIAGNOSIS — I63312 Cerebral infarction due to thrombosis of left middle cerebral artery: Secondary | ICD-10-CM

## 2016-12-19 DIAGNOSIS — I2510 Atherosclerotic heart disease of native coronary artery without angina pectoris: Secondary | ICD-10-CM | POA: Insufficient documentation

## 2016-12-19 DIAGNOSIS — Z89512 Acquired absence of left leg below knee: Secondary | ICD-10-CM | POA: Insufficient documentation

## 2016-12-19 DIAGNOSIS — Z87891 Personal history of nicotine dependence: Secondary | ICD-10-CM | POA: Insufficient documentation

## 2016-12-19 DIAGNOSIS — E785 Hyperlipidemia, unspecified: Secondary | ICD-10-CM | POA: Insufficient documentation

## 2016-12-19 HISTORY — DX: Pneumonia, unspecified organism: J18.9

## 2016-12-19 HISTORY — DX: Major depressive disorder, single episode, unspecified: F32.9

## 2016-12-19 HISTORY — DX: Malignant neoplasm of cervix uteri, unspecified: C53.9

## 2016-12-19 HISTORY — DX: Depression, unspecified: F32.A

## 2016-12-19 HISTORY — DX: Personal history of other medical treatment: Z92.89

## 2016-12-19 HISTORY — DX: Low back pain, unspecified: M54.50

## 2016-12-19 HISTORY — DX: Low back pain: M54.5

## 2016-12-19 HISTORY — DX: Unspecified osteoarthritis, unspecified site: M19.90

## 2016-12-19 HISTORY — DX: Other chronic pain: G89.29

## 2016-12-19 HISTORY — DX: Dependence on supplemental oxygen: Z99.81

## 2016-12-19 LAB — I-STAT CHEM 8, ED
BUN: 85 mg/dL — ABNORMAL HIGH (ref 6–20)
CREATININE: 3.1 mg/dL — AB (ref 0.44–1.00)
Calcium, Ion: 1.21 mmol/L (ref 1.15–1.40)
Chloride: 106 mmol/L (ref 101–111)
GLUCOSE: 295 mg/dL — AB (ref 65–99)
HCT: 30 % — ABNORMAL LOW (ref 36.0–46.0)
Hemoglobin: 10.2 g/dL — ABNORMAL LOW (ref 12.0–15.0)
POTASSIUM: 4.7 mmol/L (ref 3.5–5.1)
Sodium: 142 mmol/L (ref 135–145)
TCO2: 27 mmol/L (ref 0–100)

## 2016-12-19 LAB — DIFFERENTIAL
Basophils Absolute: 0 10*3/uL (ref 0.0–0.1)
Basophils Relative: 0 %
Eosinophils Absolute: 0.3 10*3/uL (ref 0.0–0.7)
Eosinophils Relative: 5 %
LYMPHS PCT: 29 %
Lymphs Abs: 1.6 10*3/uL (ref 0.7–4.0)
Monocytes Absolute: 0.3 10*3/uL (ref 0.1–1.0)
Monocytes Relative: 5 %
NEUTROS ABS: 3.4 10*3/uL (ref 1.7–7.7)
NEUTROS PCT: 61 %

## 2016-12-19 LAB — COMPREHENSIVE METABOLIC PANEL
ALBUMIN: 3.1 g/dL — AB (ref 3.5–5.0)
ALK PHOS: 101 U/L (ref 38–126)
ALT: 15 U/L (ref 14–54)
AST: 16 U/L (ref 15–41)
Anion gap: 11 (ref 5–15)
BUN: 81 mg/dL — ABNORMAL HIGH (ref 6–20)
CALCIUM: 9.8 mg/dL (ref 8.9–10.3)
CO2: 23 mmol/L (ref 22–32)
CREATININE: 3.2 mg/dL — AB (ref 0.44–1.00)
Chloride: 106 mmol/L (ref 101–111)
GFR calc Af Amer: 16 mL/min — ABNORMAL LOW (ref >60)
GFR calc non Af Amer: 14 mL/min — ABNORMAL LOW (ref >60)
GLUCOSE: 301 mg/dL — AB (ref 65–99)
Potassium: 4.8 mmol/L (ref 3.5–5.1)
SODIUM: 140 mmol/L (ref 135–145)
Total Bilirubin: 0.6 mg/dL (ref 0.3–1.2)
Total Protein: 7.1 g/dL (ref 6.5–8.1)

## 2016-12-19 LAB — APTT: aPTT: 26 seconds (ref 24–36)

## 2016-12-19 LAB — CBC
HCT: 31.7 % — ABNORMAL LOW (ref 36.0–46.0)
Hemoglobin: 10.1 g/dL — ABNORMAL LOW (ref 12.0–15.0)
MCH: 29.2 pg (ref 26.0–34.0)
MCHC: 31.9 g/dL (ref 30.0–36.0)
MCV: 91.6 fL (ref 78.0–100.0)
PLATELETS: 178 10*3/uL (ref 150–400)
RBC: 3.46 MIL/uL — AB (ref 3.87–5.11)
RDW: 16.7 % — ABNORMAL HIGH (ref 11.5–15.5)
WBC: 5.5 10*3/uL (ref 4.0–10.5)

## 2016-12-19 LAB — CREATININE, SERUM
Creatinine, Ser: 3.1 mg/dL — ABNORMAL HIGH (ref 0.44–1.00)
GFR calc Af Amer: 17 mL/min — ABNORMAL LOW (ref >60)
GFR calc non Af Amer: 15 mL/min — ABNORMAL LOW (ref >60)

## 2016-12-19 LAB — PROTIME-INR
INR: 1.03
PROTHROMBIN TIME: 13.5 s (ref 11.4–15.2)

## 2016-12-19 LAB — GLUCOSE, CAPILLARY
GLUCOSE-CAPILLARY: 163 mg/dL — AB (ref 65–99)
GLUCOSE-CAPILLARY: 272 mg/dL — AB (ref 65–99)

## 2016-12-19 LAB — I-STAT TROPONIN, ED: Troponin i, poc: 0.05 ng/mL (ref 0.00–0.08)

## 2016-12-19 LAB — CBG MONITORING, ED: Glucose-Capillary: 272 mg/dL — ABNORMAL HIGH (ref 65–99)

## 2016-12-19 MED ORDER — ASPIRIN EC 81 MG PO TBEC: 81.0000 mg | DELAYED_RELEASE_TABLET | Freq: Every day | ORAL | Status: DC

## 2016-12-19 MED ORDER — HEPARIN SODIUM (PORCINE) 5000 UNIT/ML IJ SOLN
5000.0000 [IU] | Freq: Three times a day (TID) | INTRAMUSCULAR | Status: DC
  Administered 2016-12-19 – 2016-12-20 (×3): 5000 [IU] via SUBCUTANEOUS
  Filled 2016-12-19 (×3): qty 1

## 2016-12-19 MED ORDER — ASPIRIN 325 MG PO TABS
325.0000 mg | ORAL_TABLET | Freq: Every day | ORAL | Status: DC
  Administered 2016-12-19 – 2016-12-20 (×2): 325 mg via ORAL
  Filled 2016-12-19 (×2): qty 1

## 2016-12-19 MED ORDER — BUPROPION HCL ER (XL) 150 MG PO TB24
300.0000 mg | ORAL_TABLET | Freq: Every day | ORAL | Status: DC
  Administered 2016-12-20: 300 mg via ORAL
  Filled 2016-12-19: qty 2

## 2016-12-19 MED ORDER — INSULIN ASPART 100 UNIT/ML ~~LOC~~ SOLN
0.0000 [IU] | Freq: Three times a day (TID) | SUBCUTANEOUS | Status: DC
  Administered 2016-12-20: 5 [IU] via SUBCUTANEOUS
  Administered 2016-12-20: 9 [IU] via SUBCUTANEOUS
  Administered 2016-12-20: 7 [IU] via SUBCUTANEOUS

## 2016-12-19 MED ORDER — ATORVASTATIN CALCIUM 80 MG PO TABS
80.0000 mg | ORAL_TABLET | Freq: Every day | ORAL | Status: DC
  Administered 2016-12-20: 80 mg via ORAL
  Filled 2016-12-19: qty 1

## 2016-12-19 MED ORDER — FERROUS SULFATE 325 (65 FE) MG PO TABS
325.0000 mg | ORAL_TABLET | Freq: Every day | ORAL | Status: DC
  Administered 2016-12-20: 325 mg via ORAL
  Filled 2016-12-19: qty 1

## 2016-12-19 MED ORDER — INSULIN GLARGINE 100 UNIT/ML ~~LOC~~ SOLN
30.0000 [IU] | Freq: Every day | SUBCUTANEOUS | Status: DC
  Administered 2016-12-19: 30 [IU] via SUBCUTANEOUS
  Filled 2016-12-19 (×3): qty 0.3

## 2016-12-19 MED ORDER — STROKE: EARLY STAGES OF RECOVERY BOOK
Freq: Once | Status: DC
  Filled 2016-12-19 (×2): qty 1

## 2016-12-19 MED ORDER — ACETAMINOPHEN 160 MG/5ML PO SOLN: 650.0000 mg | ORAL | Status: DC | PRN

## 2016-12-19 MED ORDER — ACETAMINOPHEN 650 MG RE SUPP: 650.0000 mg | RECTAL | Status: DC | PRN

## 2016-12-19 MED ORDER — ASPIRIN 300 MG RE SUPP
300.0000 mg | Freq: Every day | RECTAL | Status: DC
Start: 2016-12-19 — End: 2016-12-20
  Filled 2016-12-19: qty 1

## 2016-12-19 MED ORDER — SENNOSIDES-DOCUSATE SODIUM 8.6-50 MG PO TABS: 1.0000 | ORAL_TABLET | Freq: Every evening | ORAL | Status: DC | PRN

## 2016-12-19 MED ORDER — ACETAMINOPHEN 325 MG PO TABS: 650.0000 mg | ORAL_TABLET | ORAL | Status: DC | PRN

## 2016-12-19 NOTE — Progress Notes (Addendum)
[name] is a 66 y.o. female patient admitted from ED awake, alert - oriented  X 4 - no acute distress noted.  VSS - Blood pressure (!) 151/53, pulse 69, temperature 98.4 F (36.9 C), resp. rate 19, height 5\' 2"  (1.575 m), weight 116.4 kg (256 lb 9.9 oz), SpO2 99 %.    IV in place, occlusive dsg intact without redness.  Orientation to room, and floor completed with information packet given to patient/family.  Patient watched safety video at this time.  Admission INP armband ID verified with patient/family, and in place.   SR up x 2, fall assessment complete, with patient and family able to verbalize understanding of risk associated with falls, and verbalized understanding to call nsg before up out of bed.  Call light within reach, patient able to voice, and demonstrate understanding.  Skin, clean-dry- intact without evidence of bruising, or skin tears.   No evidence of skin break down noted on exam.     Will cont to eval and treat per MD orders.  Celine Ahr, RN 12/19/2016 6:36 PM

## 2016-12-19 NOTE — Consult Note (Signed)
Requesting Physician: Dr. Venora Maples    Chief Complaint: code stroke  History obtained from:  Patient   And daughter  HPI:                                                                                                                                         [name] is an 66 y.o. female with previous strokes in the past resulting in right sided weakness and imbalance which had resolved. Today at 0900 she was noted to have dysarthria and right facial droop. EMS was called and code stroke was initiated. On arrival she remain dysarthric and had a drift on both right arm and leg. CT head was negative for bleed or acute stroke.   Date last known well: Date: 11/31/2018 Time last known well: Time: 09:00 tPA Given: No: patient refused   Past Medical History:  Diagnosis Date  . Anemia   . CAD S/P percutaneous coronary angioplasty 5/110    status post PCI to the RCA for inferior STEMI  . Cancer ([place])    endo ca  . Chronic diastolic heart failure, NYHA class 2 ([place])    Echocardiogram 06/2014:  Technically difficult study. Normal LV size with mild LVH. EF 55-60%. Moderate diastolic dysfunction, normal RV size and function. Likely aortic sclerosis without stenosis  . CKD (chronic kidney disease), stage IV ([place])    Recent acute on chronic exacerbation in July 2014  . Diabetes mellitus, type II, insulin dependent ([place])    With complications - CAD, CVA, peripheral ulcer ,  . Diabetic foot ulcer ([place])   . Diabetic peripheral neuropathy associated with type 2 diabetes mellitus ([place])   . Dry gangrene (HCC)    Left second toe; now on Sole of L foot  . Hyperlipidemia LDL goal <70   . Hypertension associated with diabetes ([place])   . Obesity, Class III, BMI 40-49.9 (morbid obesity) (HCC)    BMI 43; 5' 2',  235 pounds 6.4 ounces  . PAD (peripheral artery disease) ([place])     -- Most recent Dopplers April 2016 Status post left BKA. Known right PTA occlusion  . ST elevation myocardial infarction (STEMI) of  inferior wall ([place])  03/2009   Ostial/proximal RCA occlusion treated with BMS stent  . Stroke/cerebrovascular accident [facility])  4/30/ 2014   Right-sided weakness, mostly with balance issues and only mild weakness.    Past Surgical History:  Procedure Laterality Date  . ABDOMINAL HYSTERECTOMY    . AMPUTATION Left 08/07/2013   Procedure: left midfoot amputation;  Surgeon: Marianna Payment, MD;  [facility]: [facility];  Service: Orthopedics;  Laterality: Left;  . AMPUTATION Left 08/09/2013   Procedure: AMPUTATION BELOW KNEE;  Surgeon: Marianna Payment, MD;  Location: [facility];  Service: Orthopedics;  Laterality: Left;  . AV FISTULA PLACEMENT Left 12/03/2014   Procedure: ARTERIOVENOUS (AV) FISTULA CREATION;  Surgeon: Angelia Mould, MD;  Location: [facility];  Service: Vascular;  Laterality: Left;  . CARDIAC CATHETERIZATION  03/31/2009   Proximal RCA thrombotic occlusion (inferior STEMI) other coronaries but in a codominant system  . LEG SURGERY     hole in bone( during Childhood)  . LEG TENDON SURGERY     patient fell in a store right leg  . PERCUTANEOUS CORONARY STENT INTERVENTION (PCI-S)  03/31/2009   PTCA , bare-metal stent 3.5 mm x 24 mm ostium of RCA ,EF 55%  . TRANSTHORACIC ECHOCARDIOGRAM  06/2014   Technically difficult study. Normal LV size with mild LVH. EF 55-60%. Moderate - grade 2  diastolic dysfunction, normal RV size and function. Likely aortic sclerosis without stenosis    Family History  Problem Relation Age of Onset  . Alcoholism Mother     Died from complications  . Alcoholism Father     Died from complications  . Cancer Father     Throat  . Pneumonia Father   . Diabetes Father   . Hypertension Father   . Heart disease      Unknown, unclear. Patient is not a good historian.  Essentially no pertinent history known   Social History:  reports that she quit smoking about 21 years ago. She has never used smokeless tobacco. She reports that she does not drink alcohol or use  drugs.  Allergies: No Known Allergies  Medications:                                                                                                                           No current facility-administered medications for this encounter.    Current Outpatient Prescriptions  Medication Sig Dispense Refill  . aspirin 81 MG tablet Take 81 mg by mouth daily.    Marland Kitchen atorvastatin (LIPITOR) 80 MG tablet Take 1 tablet (80 mg total) by mouth daily at 6 PM. (Patient taking differently: Take 40 mg by mouth daily at 6 PM. ) 30 tablet 2  . carvedilol (COREG) 25 MG tablet Take 25 mg by mouth.    . ferrous sulfate 325 (65 FE) MG tablet Take 325 mg by mouth daily with breakfast.    . furosemide (LASIX) 40 MG tablet Take 80 mg by mouth 2 (two) times daily.     Marland Kitchen glucose blood (ACCU-CHEK AVIVA PLUS) test strip USE ONE STRIP TO CHECK GLUCOSE BEFORE MEALS AND AT BEDTIME    . insulin glargine (LANTUS) 100 UNIT/ML injection Inject 0.45 mLs (45 Units total) into the skin at bedtime. 10 mL 11  . insulin lispro (HUMALOG) 100 UNIT/ML injection Inject into the skin once. Sliding scale insulin CBG 70 - 120: 0 units: CBG 121 - 150: 2 units; CBG 151 - 200: 3 units; CBG 201 - 250: 5 units; CBG 251 - 300: 8 units;CBG 301 - 350: 11 units; CBG 351 - 400: 15 units; CBG > 400 : 15 units and notify your doctor    . Insulin Pen Needle 31G X  8 MM MISC USE TO ADMINISTER INSULIN VIA LANTUS AND NOVOLOG 4 TIMES DAILY    . metaxalone (SKELAXIN) 800 MG tablet Take 0.5-1 tablets (400-800 mg total) by mouth 3 (three) times daily. 10 tablet 0  . nystatin-triamcinolone ointment (MYCOLOG) Apply 1 application topically 2 (two) times daily as needed (rash).     . Omega-3 Fatty Acids (FISH OIL) 1000 MG CAPS Take 1 capsule by mouth 2 (two) times daily.    Marland Kitchen oxyCODONE (ROXICODONE) 5 MG immediate release tablet Take 1 tablet (5 mg total) by mouth every 6 (six) hours as needed for moderate pain. 20 tablet 0  . OXYGEN Inhale 2.5 L into the lungs as  needed.     . pregabalin (LYRICA) 75 MG capsule Take 75 mg by mouth 2 (two) times daily.       ROS:                                                                                                                                       History obtained from the patient  General ROS: negative for - chills, fatigue, fever, night sweats, weight gain or weight loss Psychological ROS: negative for - behavioral disorder, hallucinations, memory difficulties, mood swings or suicidal ideation Ophthalmic ROS: negative for - blurry vision, double vision, eye pain or loss of vision ENT ROS: negative for - epistaxis, nasal discharge, oral lesions, sore throat, tinnitus or vertigo Allergy and Immunology ROS: negative for - hives or itchy/watery eyes Hematological and Lymphatic ROS: negative for - bleeding problems, bruising or swollen lymph nodes Endocrine ROS: negative for - galactorrhea, hair pattern changes, polydipsia/polyuria or temperature intolerance Respiratory ROS: negative for - cough, hemoptysis, shortness of breath or wheezing Cardiovascular ROS: negative for - chest pain, dyspnea on exertion, edema or irregular heartbeat Gastrointestinal ROS: negative for - abdominal pain, diarrhea, hematemesis, nausea/vomiting or stool incontinence Genito-Urinary ROS: negative for - dysuria, hematuria, incontinence or urinary frequency/urgency Musculoskeletal ROS: negative for - joint swelling or muscular weakness Neurological ROS: as noted in HPI Dermatological ROS: negative for rash and skin lesion changes  Neurologic Examination:                                                                                                      Height 5\' 2"  (1.575 m), weight 117.2 kg (258 lb 6.1 oz).  HEENT-  Normocephalic, no lesions, without obvious abnormality.  Normal external eye and conjunctiva.  Normal TM's bilaterally.  Normal auditory canals and external ears.  Normal external nose, mucus membranes and septum.   Normal pharynx. Cardiovascular- S1, S2 normal, pulses palpable throughout   Lungs- chest clear, no wheezing, rales, normal symmetric air entry Abdomen- soft, non-tender; bowel sounds normal; no masses,  no organomegaly Extremities- less then 2 second capillary refill Lymph-no adenopathy palpable Musculoskeletal-no joint tenderness, deformity or swelling Skin-warm and dry, no hyperpigmentation, vitiligo, or suspicious lesions  Neurological Examination Mental Status: Alert, oriented, thought content appropriate.  Speech edentulous but more dysarthric than usual. without evidence of aphasia.  Able to follow 3 step commands without difficulty. Cranial Nerves: YN:WGNFA flat bilaterally; Visual fields grossly normal,  III,IV, VI: ptosis not present, extra-ocular motions intact bilaterally, pupils equal, round, reactive to light and accommodation V,VII: smile asymmetric on the right, facial light touch sensation normal bilaterally VIII: hearing normal bilaterally IX,X: uvula rises symmetrically XI: bilateral shoulder shrug XII: midline tongue extension Motor: Right : Upper extremity   4/5    Left:     Upper extremity   5/5  Lower extremity   4/5     Lower extremity   5/5 Tone and bulk:normal tone throughout; no atrophy noted Sensory: Pinprick and light touch intact throughout, bilaterally Deep Tendon Reflexes: 1+ and symmetric throughout Plantars: Right: downgoing   Left: downgoing Cerebellar: normal finger-to-nose,  and normal heel-to-shin test Gait: not tested       Lab Results: Basic Metabolic Panel:  Recent Labs Lab 12/14/16 1403 12/19/16 1137  NA 139 142  K 4.9 4.7  CL 104 106  CO2 26  --   GLUCOSE 238* 295*  BUN 79* 85*  CREATININE 3.32* 3.10*  CALCIUM 10.2  10.1  --   PHOS 3.7  --     Liver Function Tests:  Recent Labs Lab 12/14/16 1403  ALBUMIN 3.0*   No results for input(s): LIPASE, AMYLASE in the last 168 hours. No results for input(s): AMMONIA in the  last 168 hours.  CBC:  Recent Labs Lab 12/14/16 1347 12/19/16 1137  HGB 10.1* 10.2*  HCT  --  30.0*    Cardiac Enzymes: No results for input(s): CKTOTAL, CKMB, CKMBINDEX, TROPONINI in the last 168 hours.  Lipid Panel: No results for input(s): CHOL, TRIG, HDL, CHOLHDL, VLDL, LDLCALC in the last 168 hours.  CBG: No results for input(s): GLUCAP in the last 168 hours.  Microbiology: Results for orders placed or performed during the hospital encounter of 08/06/13  Culture, blood (routine x 2)     Status: None   Collection Time: 08/06/13 11:15 AM  Result Value Ref Range Status   Specimen Description BLOOD LEFT ANTECUBITAL  Final   Special Requests BOTTLES DRAWN AEROBIC AND ANAEROBIC 5CC  Final   Culture  Setup Time   Final    08/06/2013 14:49 Performed at [facility]   Culture   Final    NO GROWTH 5 DAYS Performed at [facility]   Report Status 08/12/2013 FINAL  Final  Culture, blood (routine x 2)     Status: None   Collection Time: 08/06/13 11:40 AM  Result Value Ref Range Status   Specimen Description BLOOD RIGHT ANTECUBITAL  Final   Special Requests BOTTLES DRAWN AEROBIC AND ANAEROBIC 6ML  Final   Culture  Setup Time   Final    08/06/2013 14:49 Performed at [place]   Final    NO GROWTH 5 DAYS Performed at [facility]   Report Status 08/12/2013 FINAL  Final  MRSA PCR Screening  Status: None   Collection Time: 08/07/13  6:30 AM  Result Value Ref Range Status   MRSA by PCR NEGATIVE NEGATIVE Final    Comment:        The GeneXpert MRSA Assay (FDA approved for NASAL specimens only), is one component of a comprehensive MRSA colonization surveillance program. It is not intended to diagnose MRSA infection nor to guide or monitor treatment for MRSA infections.    Coagulation Studies: No results for input(s): LABPROT, INR in the last 72 hours.  Imaging: No results found.     Assessment and plan discussed  with with attending physician and they are in agreement.    Etta Quill PA-C [facility] [phone]  12/19/2016, 11:41 AM   Assessment: 66 y.o. female with acute worsening of right hemiparesis and slurred speech. I do suspect that she has had an ischemic infarct, but her symptoms are very mild and especially since she has some baseline right-sided weakness, I'm not certain what is new and what is old and therefore I don't think that she would be a good candidate for IV TPA at this time.  Stroke Risk Factors - diabetes mellitus, hyperlipidemia and hypertension  1. HgbA1c, fasting lipid panel 2. MRI, MRA  of the brain without contrast 3. Frequent neuro checks 4. Echocardiogram 5. Carotid dopplers 6. Prophylactic therapy-Antiplatelet med: Aspirin - dose 325mg  PO or 300mg  PR 7. Risk factor modification 8. Telemetry monitoring 9. PT consult, OT consult, Speech consult 10. please page stroke NP  Or  PA  Or MD  from 8am -4 pm starting 2/1 as this patient will be followed by the stroke team at this point.   You can look them up on [email or web address]    Roland Rack, MD [facility] [phone]  If 7pm- 7am, please page neurology on call as listed in [place].

## 2016-12-19 NOTE — ED Provider Notes (Signed)
[facility] DEPT Provider Note   CSN: [id number] Arrival date & time: 12/19/16  1130     History   Chief Complaint No chief complaint on file.   HPI [name] is a 66 y.o. female.  HPI Patient was in her normal state of health earlier today when she was found by her granddaughter to have sudden onset of slurred speech and inability to tolerate her secretions beginning at 9 AM.  Patient has a history of coronary artery disease and congestive heart failure as well as diabetes, hypertension, hyperlipidemia, peripheral arterial disease and a prior history of stroke with resultant right-sided symptoms no headache at this time.  Present to the emergency department as a code stroke and was evaluated at the bridge by the stroke team and myself.  Patient denies weakness of arms or legs.   Past Medical History:  Diagnosis Date  . Anemia   . CAD S/P percutaneous coronary angioplasty 5/110    status post PCI to the RCA for inferior STEMI  . Cancer ([place])    endo ca  . Chronic diastolic heart failure, NYHA class 2 ([place])    Echocardiogram 06/2014:  Technically difficult study. Normal LV size with mild LVH. EF 55-60%. Moderate diastolic dysfunction, normal RV size and function. Likely aortic sclerosis without stenosis  . CKD (chronic kidney disease), stage IV ([place])    Recent acute on chronic exacerbation in July 2014  . Diabetes mellitus, type II, insulin dependent ([place])    With complications - CAD, CVA, peripheral ulcer ,  . Diabetic foot ulcer ([place])   . Diabetic peripheral neuropathy associated with type 2 diabetes mellitus ([place])   . Dry gangrene (HCC)    Left second toe; now on Sole of L foot  . Hyperlipidemia LDL goal <70   . Hypertension associated with diabetes ([place])   . Obesity, Class III, BMI 40-49.9 (morbid obesity) (HCC)    BMI 43; 5' 2',  235 pounds 6.4 ounces  . PAD (peripheral artery disease) ([place])     -- Most recent Dopplers April 2016 Status post left BKA. Known right  PTA occlusion  . ST elevation myocardial infarction (STEMI) of inferior wall ([place])  03/2009   Ostial/proximal RCA occlusion treated with BMS stent  . Stroke/cerebrovascular accident [facility])  4/30/ 2014   Right-sided weakness, mostly with balance issues and only mild weakness.    Patient Active Problem List   Diagnosis Date Noted  . S/P BKA (below knee amputation) unilateral ([place]) 08/06/2014  . Chronic respiratory failure with hypoxia ([place]) 08/06/2014  . Essential hypertension 06/23/2014  . Chronic diastolic congestive heart failure, NYHA class 2 ([place]) 01/12/2014  . UTI (lower urinary tract infection) 01/11/2014  . Anemia in chronic kidney disease(285.21) 09/25/2013  . Hyponatremia 08/06/2013  . Chronic kidney disease (CKD), stage IV (severe) ([place]) 08/06/2013  . Severe obesity (BMI >= 40) (HCC) 06/22/2013    Class: Chronic  . Peripheral arterial disease: s/p L BKA; Occluded R Pop A, 2 V runnoff 05/01/2013  . Hyperlipidemia LDL goal <70   . Hypertension associated with diabetes ([place])   . Unspecified constipation 04/16/2013  . Type 2 diabetes mellitus with renal manifestations ([place]) 04/16/2013  . Secondary renovascular hypertension, benign 04/09/2013  . Nausea and vomiting 03/18/2013  . Anemia of chronic disease 03/18/2013  . CVA (cerebral infarction) 03/18/2013  . Non-ketotic hyperosmolar coma ([place]) 03/18/2013  . CAD S/P PCI TO RCA for Inferior STEMI. BMS 03/31/2009  . Status post myocardial infarction of inferior wall 03/31/2009  Past Surgical History:  Procedure Laterality Date  . ABDOMINAL HYSTERECTOMY    . AMPUTATION Left 08/07/2013   Procedure: left midfoot amputation;  Surgeon: Marianna Payment, MD;  Location: [facility];  Service: Orthopedics;  Laterality: Left;  . AMPUTATION Left 08/09/2013   Procedure: AMPUTATION BELOW KNEE;  Surgeon: Marianna Payment, MD;  Location: [facility];  Service: Orthopedics;  Laterality: Left;  . AV FISTULA PLACEMENT Left 12/03/2014   Procedure:  ARTERIOVENOUS (AV) FISTULA CREATION;  Surgeon: Angelia Mould, MD;  Location: [facility];  Service: Vascular;  Laterality: Left;  . CARDIAC CATHETERIZATION  03/31/2009   Proximal RCA thrombotic occlusion (inferior STEMI) other coronaries but in a codominant system  . LEG SURGERY     hole in bone( during Childhood)  . LEG TENDON SURGERY     patient fell in a store right leg  . PERCUTANEOUS CORONARY STENT INTERVENTION (PCI-S)  03/31/2009   PTCA , bare-metal stent 3.5 mm x 24 mm ostium of RCA ,EF 55%  . TRANSTHORACIC ECHOCARDIOGRAM  06/2014   Technically difficult study. Normal LV size with mild LVH. EF 55-60%. Moderate - grade 2  diastolic dysfunction, normal RV size and function. Likely aortic sclerosis without stenosis    OB History    No data available       Home Medications    Prior to Admission medications   Medication Sig Start Date End Date Taking? Authorizing Provider  aspirin 81 MG tablet Take 81 mg by mouth daily.    Historical Provider, MD  atorvastatin (LIPITOR) 80 MG tablet Take 1 tablet (80 mg total) by mouth daily at 6 PM. Patient taking differently: Take 40 mg by mouth daily at 6 PM.  12/07/14   Thurnell Lose, MD  carvedilol (COREG) 25 MG tablet Take 25 mg by mouth. 11/23/14   Historical Provider, MD  ferrous sulfate 325 (65 FE) MG tablet Take 325 mg by mouth daily with breakfast.    Historical Provider, MD  furosemide (LASIX) 40 MG tablet Take 80 mg by mouth 2 (two) times daily.  08/10/14   Verlee Monte, MD  glucose blood (ACCU-CHEK AVIVA PLUS) test strip USE ONE STRIP TO CHECK GLUCOSE BEFORE MEALS AND AT BEDTIME 06/18/14   Historical Provider, MD  insulin glargine (LANTUS) 100 UNIT/ML injection Inject 0.45 mLs (45 Units total) into the skin at bedtime. 09/15/14   Shanker Kristeen Mans, MD  insulin lispro (HUMALOG) 100 UNIT/ML injection Inject into the skin once. Sliding scale insulin CBG 70 - 120: 0 units: CBG 121 - 150: 2 units; CBG 151 - 200: 3 units; CBG 201 - 250: 5  units; CBG 251 - 300: 8 units;CBG 301 - 350: 11 units; CBG 351 - 400: 15 units; CBG > 400 : 15 units and notify your doctor    Historical Provider, MD  Insulin Pen Needle 31G X 8 MM MISC USE TO ADMINISTER INSULIN VIA LANTUS AND NOVOLOG 4 TIMES DAILY 08/30/14   Historical Provider, MD  metaxalone (SKELAXIN) 800 MG tablet Take 0.5-1 tablets (400-800 mg total) by mouth 3 (three) times daily. 09/20/16   Davonna Belling, MD  nystatin-triamcinolone ointment [facility]) Apply 1 application topically 2 (two) times daily as needed (rash).     Historical Provider, MD  Omega-3 Fatty Acids (FISH OIL) 1000 MG CAPS Take 1 capsule by mouth 2 (two) times daily.    Historical Provider, MD  oxyCODONE (ROXICODONE) 5 MG immediate release tablet Take 1 tablet (5 mg total) by mouth every 6 (six)  hours as needed for moderate pain. 12/03/14   Samantha J Rhyne, PA-C  OXYGEN Inhale 2.5 L into the lungs as needed.     Historical Provider, MD  pregabalin (LYRICA) 75 MG capsule Take 75 mg by mouth 2 (two) times daily.    Historical Provider, MD    Family History Family History  Problem Relation Age of Onset  . Alcoholism Mother     Died from complications  . Alcoholism Father     Died from complications  . Cancer Father     Throat  . Pneumonia Father   . Diabetes Father   . Hypertension Father   . Heart disease      Unknown, unclear. Patient is not a good historian.  Essentially no pertinent history known    Social History Social History  Substance Use Topics  . Smoking status: Former Smoker    Quit date: 04/29/1995  . Smokeless tobacco: Never Used  . Alcohol use No     Allergies   Patient has no known allergies.   Review of Systems Review of Systems  All other systems reviewed and are negative.    Physical Exam Updated Vital Signs Ht 5\' 2"  (1.575 m)   Wt 258 lb 6.1 oz (117.2 kg)   BMI 47.26 kg/m   Physical Exam  Constitutional: She is oriented to person, place, and time. She appears  well-developed and well-nourished. No distress.  HENT:  Head: Normocephalic and atraumatic.  Eyes: EOM are normal.  Neck: Normal range of motion.  Cardiovascular: Normal rate and regular rhythm.   Pulmonary/Chest: Effort normal and breath sounds normal.  Abdominal: Soft. She exhibits no distension. There is no tenderness.  Musculoskeletal: Normal range of motion.  Neurological: She is alert and oriented to person, place, and time.  Dysarthric speech.  Normal grip strength bilaterally.  Normal plantar flexion of the right foot.  Unable to test left lower STEMI secondary to prosthesis  Skin: Skin is warm and dry.  Psychiatric: She has a normal mood and affect. Judgment normal.  Nursing note and vitals reviewed.    ED Treatments / Results  Labs (all labs ordered are listed, but only abnormal results are displayed) Labs Reviewed  CBC - Abnormal; Notable for the following:       Result Value   RBC 3.46 (*)    Hemoglobin 10.1 (*)    HCT 31.7 (*)    RDW 16.7 (*)    All other components within normal limits  I-STAT CHEM 8, ED - Abnormal; Notable for the following:    BUN 85 (*)    Creatinine, Ser 3.10 (*)    Glucose, Bld 295 (*)    Hemoglobin 10.2 (*)    HCT 30.0 (*)    All other components within normal limits  DIFFERENTIAL  PROTIME-INR  APTT  COMPREHENSIVE METABOLIC PANEL  I-STAT TROPOININ, ED  CBG MONITORING, ED    EKG  EKG Interpretation  Date/Time:  Wednesday December 19 2016 11:49:51 EST Ventricular Rate:  69 PR Interval:    QRS Duration: 84 QT Interval:  398 QTC Calculation: 427 R Axis:   37 Text Interpretation:  Sinus rhythm Borderline repolarization abnormality nonspecific inferior lateral T wave inversions as compared to ecg april 2016 Confirmed by Cheresa Siers  MD, Lennette Bihari ([id number]) on 12/19/2016 11:55:12 AM       Radiology Ct Head Code Stroke W/o Cm  Result Date: 12/19/2016 CLINICAL DATA:  Code stroke.  Slurred speech EXAM: CT HEAD WITHOUT CONTRAST TECHNIQUE:  Contiguous axial images were obtained from the base of the skull through the vertex without intravenous contrast. COMPARISON:  CT head 03/06/2015 FINDINGS: Brain: Generalized atrophy with progression from the prior study. Chronic microvascular ischemic change in the white matter. Chronic infarcts in the right cerebellum and right parietal lobe unchanged. Negative for acute infarct.  Negative for hemorrhage or mass. Vascular: No hyperdense vessel or unexpected calcification. Skull: Negative Sinuses/Orbits: Mild mucosal edema left maxillary sinus. Normal orbit. Other: None ASPECTS ([place] Stroke Program Early CT Score) - Ganglionic level infarction (caudate, lentiform nuclei, internal capsule, insula, M1-M3 cortex): 7 - Supraganglionic infarction (M4-M6 cortex): 3 Total score (0-10 with 10 being normal): 10 IMPRESSION: 1. Negative for acute infarct. Moderate chronic ischemic changes are stable 2. ASPECTS is 10 These results were called by telephone at the time of interpretation on 12/19/2016 at 11:53 am to [facility], who verbally acknowledged these results. Electronically Signed   By: Franchot Gallo M.D.   On: 12/19/2016 11:54    Procedures Procedures (including critical care time)  Medications Ordered in ED Medications - No data to display   Initial Impression / Assessment and Plan / ED Course  I have reviewed the triage vital signs and the nursing notes.  Pertinent labs & imaging results that were available during my care of the patient were reviewed by me and considered in my medical decision making (see chart for details).     Code stroke on arrival.  Concerning for an acute ischemic event given her slurred speech.  Stroke team at the bedside.  CT negative for acute bleed.  I discussed the case with Dr. Leonel Ramsay of neurology who recommends admission hospitalist service and stroke workup.   Final Clinical Impressions(s) / ED Diagnoses   Final diagnoses:  Slurred speech    New  Prescriptions New Prescriptions   No medications on file     Jola Schmidt, MD 12/19/16 1214

## 2016-12-19 NOTE — ED Notes (Signed)
Pt voiced that she would like more information in considering receiving TPA. Dr. Venora Maples and Waunita Schooner Neuro PA made aware. Waunita Schooner PA states to await Dr. Cecil Cobbs arrival to unit to speak with patient

## 2016-12-19 NOTE — ED Notes (Signed)
Pt has finished in mri

## 2016-12-19 NOTE — ED Notes (Signed)
The pt is alert  Speech slurred

## 2016-12-19 NOTE — Code Documentation (Signed)
66 y.o. Female presents from home to the [facility] ED via [facility] as code stroke. History is provided by patient and daughter who is at bedside. At 0900 this morning the patient had an acute onset of slurred speech. EMS was then called. EMS reports slurred speech that waxed and waned en route and also appreciated a right sided facial droop. CBG stated to be 384. Upon arrival to [facility], labs were drawn and pt taken to CT. CTH negative for acute infarct. Moderate chronic ischemic changes are stable. ASPECTS is 10. NIHSS 4. Neuro exam remarkable for slight facial droop, RUE drift, RLE drift, and dysarthria. Upon assessment, dysarthria has continued to wax and wane. See EMR for code stroke times and NIHSS. tPA offered but patient refused medication. Pt to remain in the tPA window until 13:30. Bedside handoff with ED RN Raquel Sarna.

## 2016-12-19 NOTE — ED Notes (Signed)
Spoke with Stroke RN via Telephone and informed him regarding situation, states will page Dr. Leonel Ramsay immediately and relay information.

## 2016-12-19 NOTE — H&P (Signed)
Date: 12/19/2016               Patient Name:  [name] MRN: [id number]  DOB: 26-Feb-1951 Age / Sex: 66 y.o., female   PCP: Katherina Mires, MD         Medical Service: Internal Medicine Teaching Service         Attending Physician: Dr. Lalla Brothers    First Contact: Dr. Asencion Partridge Pager: [phone]  Second Contact: Dr. Maryellen Pile Pager: [phone]       After Hours (After 5p/  First Contact Pager: [phone]  weekends / holidays): Second Contact Pager: [phone]   Chief Complaint: Acute dysarthria  History of Present Illness: [name] is a 66 y.o. woman with PMH CAD (s/p STEMI with PCI to RCA 2010), chronic dCHF, CKD4, T2DM, HTN, HLD, obesity, PAD, L BKA, and CVA in 2014 with residual right hemiparesis who presents for acute onset dysarthria and right facial droop that began shortly after 9AM today. Her daughter noticed that her mother was talking strangely and at first attributed to her possibly eating something. Howevere it persisted and she became concerned about stroke. [name] did not experience any new weakness, numbness, dysphagia, or confusion. She states that her problem has to do with producing words and speaking clearly rather than word-finding. She is unsure if she has been taking her Aspirin or statin daily. Family called EMS promptly this morning and she was brought to [facility] ED as a code stroke.  In the the ED, vitals were unremarkable and exam revealed slight facial droop, RUE and RLE drift, and dysarthria. CT head was negative for acute infarct or hemorrhage. tPA offered but patient refused, symptoms seemed mild and resolving. Labs remarkable for CBG 301, Cr 3.2 (baseline 2.5), Hb 10.1 (MCV 91). IMTS was contacted for admission.  Meds:  Current Meds  Medication Sig  . buPROPion (WELLBUTRIN XL) 300 MG 24 hr tablet Take 300 mg by mouth daily.  . carvedilol (COREG) 25 MG tablet Take 25 mg by mouth every 12 (twelve) hours.   . ferrous sulfate 325 (65 FE)  MG tablet Take 325 mg by mouth daily with breakfast.  . furosemide (LASIX) 40 MG tablet Take 80 mg by mouth 2 (two) times daily.   Marland Kitchen glucose blood (ACCU-CHEK AVIVA PLUS) test strip USE ONE STRIP TO CHECK GLUCOSE BEFORE MEALS AND AT BEDTIME  . insulin glargine (LANTUS) 100 UNIT/ML injection Inject 0.45 mLs (45 Units total) into the skin at bedtime. (Patient taking differently: Inject 50 Units into the skin at bedtime. )  . insulin lispro protamine-lispro (HUMALOG 50/50 MIX) (50-50) 100 UNIT/ML SUSP injection Inject 5-8 Units into the skin See admin instructions. 5 units with breakfast, 8 units with lunch, 8 units with dinner  . Insulin Pen Needle 31G X 8 MM MISC USE TO ADMINISTER INSULIN VIA LANTUS AND NOVOLOG 4 TIMES DAILY  . metaxalone (SKELAXIN) 800 MG tablet Take 0.5-1 tablets (400-800 mg total) by mouth 3 (three) times daily. (Patient taking differently: Take 400-800 mg by mouth daily. )  . OXYGEN Inhale 2.5 L into the lungs as needed.     Allergies: Allergies as of 12/19/2016  . (No Known Allergies)   Past Medical History:  Diagnosis Date  . Anemia   . CAD S/P percutaneous coronary angioplasty 5/110    status post PCI to the RCA for inferior STEMI  . Cancer ([place])    endo ca  . Chronic diastolic heart failure, NYHA  class 2 ([place])    Echocardiogram 06/2014:  Technically difficult study. Normal LV size with mild LVH. EF 55-60%. Moderate diastolic dysfunction, normal RV size and function. Likely aortic sclerosis without stenosis  . CKD (chronic kidney disease), stage IV ([place])    Recent acute on chronic exacerbation in July 2014  . Diabetes mellitus, type II, insulin dependent ([place])    With complications - CAD, CVA, peripheral ulcer ,  . Diabetic foot ulcer ([place])   . Diabetic peripheral neuropathy associated with type 2 diabetes mellitus ([place])   . Dry gangrene (HCC)    Left second toe; now on Sole of L foot  . Hyperlipidemia LDL goal <70   . Hypertension associated with diabetes ([place])     . Obesity, Class III, BMI 40-49.9 (morbid obesity) (HCC)    BMI 43; 5' 2',  235 pounds 6.4 ounces  . PAD (peripheral artery disease) ([place])     -- Most recent Dopplers April 2016 Status post left BKA. Known right PTA occlusion  . ST elevation myocardial infarction (STEMI) of inferior wall ([place])  03/2009   Ostial/proximal RCA occlusion treated with BMS stent  . Stroke/cerebrovascular accident [facility])  4/30/ 2014   Right-sided weakness, mostly with balance issues and only mild weakness.    Family History:  Family History  Problem Relation Age of Onset  . Alcoholism Mother     Died from complications  . Alcoholism Father     Died from complications  . Cancer Father     Throat  . Pneumonia Father   . Diabetes Father   . Hypertension Father   . Heart disease      Unknown, unclear. Patient is not a good historian.  Essentially no pertinent history known    Social History:  Social History   Social History  . Marital status: Widowed    Spouse name: N/A  . Number of children: 4  . Years of education: N/A   Occupational History  .  Unemployed   Social History Main Topics  . Smoking status: Former Smoker    Quit date: 04/29/1995  . Smokeless tobacco: Never Used  . Alcohol use No  . Drug use: No  . Sexual activity: No   Other Topics Concern  . Not on file   Social History Narrative   Was previously living in an assisted living center while recovering from her stroke. [place] is now recently moved back home with her family.    She is attended by her daughter, who is super morbidly obese.    Review of Systems: A complete ROS was negative except as per HPI.   Physical Exam: Blood pressure 160/66, pulse 67, temperature 98.4 F (36.9 C), resp. rate 16, height 5\' 2"  (1.575 m), weight 256 lb 9.9 oz (116.4 kg), SpO2 98 %.  General appearance: Obese woman resting comfortably in bed, in no distress, conversational with dysarthric speech HENT: Normocephalic, atraumatic, moist  mucous membranes, neck supple Eyes: PERRL, EOM inact, non-icteric Cardiovascular: Regular rate and rhythm, no murmurs, rubs, gallops Respiratory: Clear to auscultation bilaterally, normal work of breathing Abdomen: Obese, BS+, soft, non-tender, non-distended Extremities: Normal range of motion, no edema, 2+ peripheral pulses Skin: Warm, dry, intact Neuro: Alert and oriented, dysarthric speech, vision intact, face appears symmetric, tongue midline and uvula elevates symmetrically, no pronator drift, strength and sensation intact throughout, gait deferred Psych: Positive affect, thoughts linear and goal-directed  EKG: NSR, nonspecific new inferolateral TWI  Assessment & Plan by Problem:  Acute onset  dysarthria and right facial droop, due to acute left putamen infarct in this 66 yo woman with numerous risk factors. CT head was negative and she refused tPA. MRI brain revealed small acute infarct within the left posterior putamen extending into posterior corona radiata. Chronic R cerebellar and occipital infarcts, and advanced chronic microvascular ischemic changes and moderate parenchymal volume loss. MRA negative for occlusion, stenosis, or aneurysm. - Neuro following, appreciate recs - Started on Aspirin 325 mg  - Resume high intensity statin - Lipitor 80 mg  - TTE - VAS US carotids - Checking lipid panel, HbA1c - Permissive HTN < 220/110 - Telemetry, neurochecks - PT, OT, SLP eval and treat  T2DM, last A1c 8.3 in 06/2016, at home prescribed Lantus 50 units QHS and Humalog 50/50 5-8 units TID AC - SSI-S TID AC - Lantus 30 units QHS  Anemia of chronic disease, Hb 10.1, MCV 91, ferritin 1600 on 12/14/2016. - continue home Ferrous sulfate 325 mg QD  CKD 4, with creatinine 3.1 today from baseline 2.5 - Trend BMP  Chronic diastolic CHF, LVEF 56-38% with G2DD in 06/2014 - Holding home Coreg 25 BID and Lasix 80 mg BID in setting of permissive HTN - Daily weights  Depression - continue home  Bupropion 300 mg  Chronic back pain - holding home Metaxalone and Oxycodone   FEN/GI: HH CM diet, replete electrolytes as needed  DVT ppx: Heparin TID  Code status: Full code  Dispo: Admit patient to Observation with expected length of stay less than 2 midnights.  Signed: Asencion Partridge, MD 12/19/2016, 2:03 PM  Pager: [phone]

## 2016-12-19 NOTE — ED Notes (Signed)
Report called mri just took her to [facility]

## 2016-12-19 NOTE — ED Notes (Signed)
Dr. Leonel Ramsay speaking with family and patient

## 2016-12-19 NOTE — ED Triage Notes (Signed)
Pt presents from home via [facility] with c/o right sided weakness and right sided facial droop and slurred speech - LSN 0900. Pt has hx CVA with resolved right-sided deficits. See RRRN Denise's note/documentation.

## 2016-12-19 NOTE — ED Notes (Signed)
Attempted to give report unsuccessful

## 2016-12-19 NOTE — Progress Notes (Signed)
Received report on pt.

## 2016-12-20 ENCOUNTER — Encounter (HOSPITAL_COMMUNITY): Payer: Self-pay | Admitting: *Deleted

## 2016-12-20 ENCOUNTER — Observation Stay (HOSPITAL_BASED_OUTPATIENT_CLINIC_OR_DEPARTMENT_OTHER): Payer: Medicare Other

## 2016-12-20 DIAGNOSIS — I10 Essential (primary) hypertension: Secondary | ICD-10-CM | POA: Diagnosis not present

## 2016-12-20 DIAGNOSIS — I63312 Cerebral infarction due to thrombosis of left middle cerebral artery: Secondary | ICD-10-CM

## 2016-12-20 DIAGNOSIS — Z794 Long term (current) use of insulin: Secondary | ICD-10-CM

## 2016-12-20 DIAGNOSIS — E1159 Type 2 diabetes mellitus with other circulatory complications: Secondary | ICD-10-CM | POA: Diagnosis not present

## 2016-12-20 DIAGNOSIS — I6789 Other cerebrovascular disease: Secondary | ICD-10-CM | POA: Diagnosis not present

## 2016-12-20 DIAGNOSIS — E785 Hyperlipidemia, unspecified: Secondary | ICD-10-CM | POA: Diagnosis not present

## 2016-12-20 DIAGNOSIS — I639 Cerebral infarction, unspecified: Secondary | ICD-10-CM | POA: Diagnosis not present

## 2016-12-20 LAB — BASIC METABOLIC PANEL
ANION GAP: 9 (ref 5–15)
BUN: 83 mg/dL — ABNORMAL HIGH (ref 6–20)
CO2: 24 mmol/L (ref 22–32)
Calcium: 9.4 mg/dL (ref 8.9–10.3)
Chloride: 106 mmol/L (ref 101–111)
Creatinine, Ser: 3.23 mg/dL — ABNORMAL HIGH (ref 0.44–1.00)
GFR calc Af Amer: 16 mL/min — ABNORMAL LOW (ref >60)
GFR, EST NON AFRICAN AMERICAN: 14 mL/min — AB (ref >60)
Glucose, Bld: 276 mg/dL — ABNORMAL HIGH (ref 65–99)
POTASSIUM: 4.4 mmol/L (ref 3.5–5.1)
SODIUM: 139 mmol/L (ref 135–145)

## 2016-12-20 LAB — LIPID PANEL
Cholesterol: 197 mg/dL (ref 0–200)
HDL: 26 mg/dL — ABNORMAL LOW (ref >40)
LDL CALC: 108 mg/dL — AB (ref 0–99)
Total CHOL/HDL Ratio: 7.6 RATIO
Triglycerides: 314 mg/dL — ABNORMAL HIGH (ref <150)
VLDL: 63 mg/dL — ABNORMAL HIGH (ref 0–40)

## 2016-12-20 LAB — ECHOCARDIOGRAM COMPLETE
Height: 62 in
Weight: 4105.85 oz

## 2016-12-20 LAB — GLUCOSE, CAPILLARY
GLUCOSE-CAPILLARY: 271 mg/dL — AB (ref 65–99)
GLUCOSE-CAPILLARY: 317 mg/dL — AB (ref 65–99)
Glucose-Capillary: 379 mg/dL — ABNORMAL HIGH (ref 65–99)

## 2016-12-20 MED ORDER — INSULIN GLARGINE 100 UNIT/ML ~~LOC~~ SOLN
35.0000 [IU] | Freq: Every day | SUBCUTANEOUS | Status: DC
  Filled 2016-12-20: qty 0.35

## 2016-12-20 MED ORDER — ATORVASTATIN CALCIUM 80 MG PO TABS: 80.0000 mg | ORAL_TABLET | Freq: Every day | ORAL | 2 refills | Status: DC

## 2016-12-20 MED ORDER — CLOPIDOGREL BISULFATE 75 MG PO TABS: 75.0000 mg | ORAL_TABLET | Freq: Every day | ORAL | 2 refills | Status: DC

## 2016-12-20 MED ORDER — PERFLUTREN LIPID MICROSPHERE
1.0000 mL | INTRAVENOUS | Status: AC | PRN
  Administered 2016-12-20: 2 mL via INTRAVENOUS
  Filled 2016-12-20: qty 10

## 2016-12-20 NOTE — Progress Notes (Signed)
STROKE TEAM PROGRESS NOTE   HISTORY OF PRESENT ILLNESS (per record) [name] is an 66 y.o. female with previous strokes in the past resulting in right sided weakness and imbalance which had resolved. Today 12/19/2016 at 0900 (LKW) she was noted to have dysarthria and right facial droop. EMS was called and code stroke was initiated. On arrival she remain dysarthric and had a drift on both right arm and leg. CT head was negative for bleed or acute stroke. Patient was not administered IV t-PA as she refused. She was admitted for further evaluation and treatment.   SUBJECTIVE (INTERVAL HISTORY) Daughter is at bedside. She still has right facial droop and slurry speech which are new. She still has residue deficit on the right arm from previous stroke. She takes baby ASA and high dose lipitor at home.    OBJECTIVE Temp:  [97.4 F (36.3 C)-98.8 F (37.1 C)] 97.4 F (36.3 C) (02/01 1337) Pulse Rate:  [74-82] 74 (02/01 1337) Cardiac Rhythm: Normal sinus rhythm (02/01 0700) Resp:  [17-18] 17 (02/01 1337) BP: (136-160)/(48-56) 156/50 (02/01 1337) SpO2:  [95 %-100 %] 97 % (02/01 1337)  CBC:  Recent Labs Lab 12/19/16 1132 12/19/16 1137  WBC 5.5  --   NEUTROABS 3.4  --   HGB 10.1* 10.2*  HCT 31.7* 30.0*  MCV 91.6  --   PLT 178  --     Basic Metabolic Panel:  Recent Labs Lab 12/14/16 1403 12/19/16 1132 12/19/16 1137 12/19/16 1855 12/20/16 0424  NA 139 140 142  --  139  K 4.9 4.8 4.7  --  4.4  CL 104 106 106  --  106  CO2 26 23  --   --  24  GLUCOSE 238* 301* 295*  --  276*  BUN 79* 81* 85*  --  83*  CREATININE 3.32* 3.20* 3.10* 3.10* 3.23*  CALCIUM 10.2  10.1 9.8  --   --  9.4  PHOS 3.7  --   --   --   --     Lipid Panel:    Component Value Date/Time   CHOL 197 12/20/2016 0424   TRIG 314 (H) 12/20/2016 0424   HDL 26 (L) 12/20/2016 0424   CHOLHDL 7.6 12/20/2016 0424   VLDL 63 (H) 12/20/2016 0424   LDLCALC 108 (H) 12/20/2016 0424   HgbA1c:  Lab Results  Component  Value Date   HGBA1C 8.2 (H) 08/06/2014   Urine Drug Screen: No results found for: LABOPIA, COCAINSCRNUR, LABBENZ, AMPHETMU, THCU, LABBARB    IMAGING I have personally reviewed the radiological images below and agree with the radiology interpretations.  Ct Head Code Stroke W/o Cm 12/19/2016 1. Negative for acute infarct. Moderate chronic ischemic changes are stable 2. ASPECTS is 10   Mr Brain Wo Contrast Mr Jodene Nam Head Wo Contrast 12/19/2016 1. Small acute infarct within the left posterior putamen extending into posterior corona radiata. No associated hemorrhage identified. 2. Chronic right cerebellar and occipital infarcts. 3. Advanced chronic microvascular ischemic changes and moderate brain parenchymal volume loss is stable. 4. Moderate motion degradation of time-of-flight MRA, suboptimal assessment for subtle stenosis or small aneurysm. No large vessel occlusion, high-grade stenosis, or aneurysm identified in the anterior or posterior circulation.   2-D echocardiogram - Left ventricle: The cavity size was normal. Wall thickness was increased in a pattern of mild LVH. Systolic function was vigorous. The estimated ejection fraction was in the range of 65% to 70%. Wall motion was normal; there were no regional wall  motion abnormalities. Doppler parameters are consistent with abnormal left ventricular relaxation (grade 1 diastolic dysfunction). Doppler parameters are consistent with high ventricular filling pressure. - Aortic valve: There was mild stenosis. - Mitral valve: Calcified annulus. Mildly thickened leaflets . - Left atrium: The atrium was moderately dilated.  Carotid Doppler   There is 1-39% bilateral ICA stenosis. Vertebral artery flow is antegrade.     PHYSICAL EXAM  Temp:  [97.4 F (36.3 C)-98.6 F (37 C)] 97.4 F (36.3 C) (02/01 1337) Pulse Rate:  [74-82] 74 (02/01 1337) Resp:  [17-18] 17 (02/01 1337) BP: (136-156)/(50-55) 156/50 (02/01 1337) SpO2:  [97 %-98 %] 97 %  (02/01 1337)  General - obese, well developed, in no apparent distress.  Ophthalmologic - Fundi not visualized due to noncooperation.  Cardiovascular - Regular rate and rhythm.  Mental Status -  Level of arousal and orientation to time, place, and person were intact. Language including expression, naming, repetition, comprehension was assessed and found intact. Fund of Knowledge was assessed and was intact.  Cranial Nerves II - XII - II - Visual field intact OU. III, IV, VI - Extraocular movements intact. V - Facial sensation intact bilaterally. VII - right facial droop. VIII - Hearing & vestibular intact bilaterally. X - Palate elevates symmetrically, mild dysarthria. XI - Chin turning & shoulder shrug intact bilaterally. XII - Tongue protrusion intact.  Motor Strength - The patient's strength was normal in all extremities except right pronator drift was absent and LLE BKA.  Bulk was normal and fasciculations were absent.   Motor Tone - Muscle tone was assessed at the neck and appendages and was normal.  Reflexes - The patient's reflexes were 1+ in all extremities and she had no pathological reflexes.  Sensory - Light touch, temperature/pinprick were assessed and were symmetrical.    Coordination - The patient had normal movements in the hands with no ataxia or dysmetria.  Tremor was absent.  Gait and Station - deferred.   ASSESSMENT/PLAN [name] is a 67 y.o. 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 She did not receive IV t-PA as she refused.   Stroke:  Small left posterior putamen infarct extending into the posterior corona radiata secondary to small vessel disease source  Resultant  Right facial droop and mild dysarthria  MRI  small left posterior putamen infarct extending  into the posterior corona radiata. Old right cerebellar and occipital infarcts.   MRA  no large vessel occlusion  Carotid Doppler  no ICA stenosis  2D Echo  EF 65-70%. No source of embolus  LDL 108  HgbA1c pending  Heparin 5000 units sq tid for VTE prophylaxis  Diet heart healthy/carb modified Room service appropriate? Yes; Fluid consistency: Thin  No antithrombotic prior to admission, now on aspirin 325 mg daily. Recommend to change to plavix for further stroke prevention.  Patient counseled to be compliant with her antithrombotic medications  Ongoing aggressive stroke risk factor management  Therapy recommendations:  HH PT, no OT  Disposition:  home  History stroke   In 2014 with right cerebellar and right occipital infarcts  resulting with ? right hemiparesis as per pt  Hypertension  Stable  Permissive hypertension (OK if < 220/120) but gradually normalize in 5-7 days  Long-term BP goal normotensive  Hyperlipidemia  Home meds:  Lipitor 80, resumed  in hospital  LDL 108, goal < 70  Continue statin at discharge  Diabetes type II  HgbA1c pending, goal < 7.0  Diabetes coordinator following.   Recommendations made  On lantus  SSI  Other Stroke Risk Factors  Advanced age  Morbid Obesity, Body mass index is 46.94 kg/m., recommend weight loss, diet and exercise as appropriate    Chronic diastolic CHF  Coronary artery disease s/p PCI  PAD  Other Active Problems  Anemia of chronic disease  Chronic disease stage IV  Depression  Chronic back pain  Hospital day # 0  Neurology will sign off. Please call with questions. Pt will follow up with carolyn martin NP at [facility] in about 6 weeks. Thanks for the consult.  Rosalin Hawking, MD PhD Stroke Neurology 12/21/2016 12:32 AM    To contact Stroke Continuity provider, please refer to [email or web address] After hours, contact General Neurology

## 2016-12-20 NOTE — Evaluation (Signed)
Physical Therapy Evaluation Patient Details Name: [name] MRN: [id number] DOB: 1951-11-07 Today's Date: 12/20/2016   History of Present Illness  [name] is a 66 y.o. 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  Clinical Impression  Pt is close to baseline functioning and should be safe at home with daughter and family's assist.  Pt is at min guard level at this time without her usual stamina, but similar function.. There are no further acute PT needs.  Will sign off at this time in favor of management at home with HHPT.     Follow Up Recommendations Home health PT;Supervision for mobility/OOB    Equipment Recommendations       Recommendations for Other Services       Precautions / Restrictions Precautions Precautions: Fall Precaution Comments: L BKA prosthesis Restrictions Weight Bearing Restrictions: No      Mobility  Bed Mobility               General bed mobility comments: sitting upright on arrival  Transfers Overall transfer level: Needs assistance Equipment used: Rolling walker (2 wheeled) Transfers: Sit to/from Stand Sit to Stand: Min guard         General transfer comment: slow and mildly effortful, but no assist with L prosthesis  Ambulation/Gait Ambulation/Gait assistance: Min guard Ambulation Distance (Feet): 18 Feet (then additional 25 feet with RW) Assistive device: Rolling walker (2 wheeled) Gait Pattern/deviations: Step-to pattern     General Gait Details: stable step to gait.  Quick to fatigue and become more clumbsy.  Stairs            Wheelchair Mobility    Modified Rankin (Stroke Patients Only) Modified Rankin (Stroke Patients  Only) Pre-Morbid Rankin Score: Moderate disability Modified Rankin: Moderately severe disability     Balance Overall balance assessment: Needs assistance   Sitting balance-Leahy Scale: Fair       Standing balance-Leahy Scale: Poor Standing balance comment: reliant on the RW                             Pertinent Vitals/Pain Pain Assessment: No/denies pain    Home Living Family/patient expects to be discharged to:: Private residence Living Arrangements: Children;Other relatives Available Help at Discharge: Family;Available 24 hours/[name] Type of Home: Apartment Home Access: Stairs to enter   Entrance Stairs-Number of Steps: 1 Home Layout: One level Home Equipment: Bedside commode;Walker - 2 wheels;Wheelchair - manual      Prior Function Level of Independence: Needs assistance   Gait / Transfers Assistance Needed: primarily at w/c level for mobility. Does use RW with prosthesis for short distance mobility  ADL's / Homemaking Assistance Needed: Assist for bathing and dressing, to don L BKA prosthesis        Hand Dominance   Dominant Hand: Right    Extremity/Trunk Assessment   Upper Extremity Assessment Upper Extremity Assessment: Defer to OT evaluation    Lower Extremity Assessment Lower Extremity Assessment: Overall WFL for tasks assessed (general proximal weakness, but generally baseline function)       Communication   Communication: Expressive difficulties  Cognition Arousal/Alertness: Awake/alert Behavior During Therapy: Flat affect Overall Cognitive Status: Within  Functional Limits for tasks assessed                      General Comments      Exercises     Assessment/Plan    PT Assessment All further PT needs can be met in the next venue of care  PT Problem List Decreased strength;Decreased activity tolerance;Decreased mobility;Decreased knowledge of use of DME;Obesity          PT Treatment Interventions      PT Goals  (Current goals can be found in the Care Plan section)  Acute Rehab PT Goals Patient Stated Goal: home PT Goal Formulation: With patient    Frequency     Barriers to discharge        Co-evaluation PT/OT/SLP Co-Evaluation/Treatment: Yes Reason for Co-Treatment: For patient/therapist safety PT goals addressed during session: Mobility/safety with mobility         End of Session   Activity Tolerance: Patient tolerated treatment well;Patient limited by fatigue Patient left: in chair;with call bell/phone within reach;with family/visitor present Nurse Communication: Mobility status    Functional Assessment Tool Used: clinical judgement Functional Limitation: Mobility: Walking and moving around Mobility: Walking and Moving Around Current Status (K3276): At least 1 percent but less than 20 percent impaired, limited or restricted Mobility: Walking and Moving Around Goal Status [phone]): At least 1 percent but less than 20 percent impaired, limited or restricted    Time: 1353-1430 PT Time Calculation (min) (ACUTE ONLY): 37 min   Charges:   PT Evaluation $PT Eval Moderate Complexity: 1 Procedure     PT G Codes:   PT G-Codes **NOT FOR INPATIENT CLASS** Functional Assessment Tool Used: clinical judgement Functional Limitation: Mobility: Walking and moving around Mobility: Walking and Moving Around Current Status (K9574): At least 1 percent but less than 20 percent impaired, limited or restricted Mobility: Walking and Moving Around Goal Status [phone]): At least 1 percent but less than 20 percent impaired, limited or restricted    Burnard Bunting 12/20/2016, 2:49 PM 12/20/2016  Donnella Sham, PT [phone]  (pager)

## 2016-12-20 NOTE — Care Management Obs Status (Signed)
[facility] NOTIFICATION   Patient Details  Name: [name] MRN: [id number] Date of Birth: 01-Jun-1951   Medicare Observation Status Notification Given:  Yes    Sharin Mons, RN 12/20/2016, 1:35 PM

## 2016-12-20 NOTE — Progress Notes (Signed)
  Echocardiogram 2D Echocardiogram with Definity has been performed.  Dung Prien 12/20/2016, 11:11 AM

## 2016-12-20 NOTE — Evaluation (Signed)
Occupational Therapy Evaluation Patient Details Name: [name] MRN: [id number] DOB: 1951-04-24 Today's Date: 12/20/2016    History of Present Illness Gretchen Weinfeld is a 66 y.o. 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   Clinical Impression   Pt reports she required assist with bathing and dressing PTA; was able to complete toilet transfers independently. Currently pt overall min guard for functional mobility and min-max assist for ADL. Pt reports she feels she has returned to her functional baseline except for speech deficits. Encouraged functional independence with pt and family and improving activity tolerance; they are agreeable. Pt planning to d/c home with 24/7 supervision from family. No further acute OT needs identified; signing off at this time. Please re-consult if needs change. Thank you for this referral.    Follow Up Recommendations  No OT follow up;Supervision/Assistance - 24 hour    Equipment Recommendations  None recommended by OT    Recommendations for Other Services       Precautions / Restrictions Precautions Precautions: Fall Precaution Comments: L BKA prosthesis Restrictions Weight Bearing Restrictions: No      Mobility Bed Mobility               General bed mobility comments: sitting upright on arrival  Transfers Overall transfer level: Needs assistance Equipment used: Rolling walker (2 wheeled) Transfers: Sit to/from Stand Sit to Stand: Min guard         General transfer comment: slow and mildly effortful, but no assist with L prosthesis    Balance Overall balance assessment: Needs assistance   Sitting balance-Leahy Scale: Fair       Standing  balance-Leahy Scale: Poor Standing balance comment: reliant on the RW                            ADL Overall ADL's : Needs assistance/impaired Eating/Feeding: Set up;Sitting   Grooming: Set up;Sitting   Upper Body Bathing: Minimal assistance;Sitting   Lower Body Bathing: Maximal assistance;Sit to/from stand   Upper Body Dressing : Min guard;Sitting   Lower Body Dressing: Maximal assistance;Sit to/from stand Lower Body Dressing Details (indicate cue type and reason): to don prosthesis and shoe Toilet Transfer: Min guard;Ambulation;BSC;RW   Toileting- Clothing Manipulation and Hygiene: Maximal assistance;Sit to/from stand       Functional mobility during ADLs: Min guard;Rolling walker General ADL Comments: Per pt and daughter, pt currenlty at functional baseline. Only noticible deficits at speech related.     Vision Vision Assessment?: No apparent visual deficits   Perception     Praxis      Pertinent Vitals/Pain Pain Assessment: No/denies pain     Hand Dominance Right   Extremity/Trunk Assessment Upper Extremity Assessment Upper Extremity Assessment: Overall WFL for tasks assessed (R grossly weaker than L but functional, at baseline)   Lower Extremity Assessment Lower Extremity Assessment: Defer to PT evaluation       Communication Communication Communication: Expressive difficulties   Cognition Arousal/Alertness: Awake/alert Behavior During Therapy: Flat affect Overall Cognitive Status: Within Functional Limits for tasks assessed                     General Comments  Exercises       Shoulder Instructions      Home Living Family/patient expects to be discharged to:: Private residence Living Arrangements: Children;Other relatives Available Help at Discharge: Family;Available 24 hours/day Type of Home: Apartment Home Access: Stairs to enter Entrance Stairs-Number of Steps: 1   Home Layout: One level     Bathroom  Shower/Tub: Tub/shower unit (only sponge bathes)   Bathroom Toilet:  (only uses BSC) Bathroom Accessibility: Yes How Accessible: Accessible via walker (if going in sideways) Home Equipment: Bedside commode;Walker - 2 wheels;Wheelchair - manual          Prior Functioning/Environment Level of Independence: Needs assistance  Gait / Transfers Assistance Needed: primarily at w/c level for mobility. Does use RW with prosthesis for short distance mobility ADL's / Homemaking Assistance Needed: Assist for bathing and dressing, to don L BKA prosthesis            OT Problem List:     OT Treatment/Interventions:      OT Goals(Current goals can be found in the care plan section) Acute Rehab OT Goals Patient Stated Goal: home OT Goal Formulation: All assessment and education complete, DC therapy  OT Frequency:     Barriers to D/C:            Co-evaluation PT/OT/SLP Co-Evaluation/Treatment: Yes Reason for Co-Treatment: For patient/therapist safety PT goals addressed during session: Mobility/safety with mobility OT goals addressed during session: ADL's and self-care      End of Session Equipment Utilized During Treatment: Rolling walker;Other (comment) (L BKA prosthesis) Nurse Communication: Mobility status  Activity Tolerance: Patient tolerated treatment well Patient left: in chair;with call bell/phone within reach;with family/visitor present   Time: 1353-1430 OT Time Calculation (min): 37 min Charges:  OT General Charges $OT Visit: 1 Procedure OT Evaluation $OT Eval Moderate Complexity: 1 Procedure G-Codes: OT G-codes **NOT FOR INPATIENT CLASS** Functional Assessment Tool Used: Clinical judgement Functional Limitation: Self care Self Care Current Status (N9892): At least 20 percent but less than 40 percent impaired, limited or restricted Self Care Goal Status (J1941): At least 20 percent but less than 40 percent impaired, limited or restricted Self Care Discharge Status  [phone]): At least 20 percent but less than 40 percent impaired, limited or restricted   Binnie Kand M.S., OTR/L Pager: [phone]  12/20/2016, 3:15 PM

## 2016-12-20 NOTE — Progress Notes (Signed)
Internal Medicine Attending:   I saw and examined the patient. I reviewed the resident's note and I agree with the resident's findings and plan as documented in the resident's note.  See my admission attestation as needed. 66 year old woman who is hospital day #1 with an acute left-sided CVA causing dysarthria. Examined the patient with the daughter at the bedside today. Her speech is getting a little bit better but not back to her baseline yet. Plan refer therapy consults today. Stroke evaluation is underway. Stroke team is following. Current medical management with aspirin and atorvastatin for secondary prevention.

## 2016-12-20 NOTE — Progress Notes (Signed)
Patient was discharged home with home health by MD order; discharged instructions  review and give to patient and her daughter with care notes; IV DIC; per MD, Case Manager will work for patient's home health needs tomorrow 12/21/2016; patient will be escorted to the car by nurse tech via wheelchair.

## 2016-12-20 NOTE — Progress Notes (Addendum)
   Subjective:  No acute events overnight. Patient states she feels alright and that her speech is improving - daughter confirms. Denies weakness, chest pain, shortness of breath. No concerns at this time.   **Addendum: Dr. Charlynn Grimes discussed with neurology; work up was reassuring - carotids clear, no source of thrombus on echo. Ok to discharge patient on plavix (stop asa), and risk factor modification. Patient agrees to [facility] PT recommendation.   Objective:  Vital signs in last 24 hours: Vitals:   12/20/16 0103 12/20/16 0300 12/20/16 0500 12/20/16 1337  BP: (!) 136/50 (!) 145/55 (!) 151/54 (!) 156/50  Pulse: 75 76 82 74  Resp: 18 18 18 17   Temp: 98.6 F (37 C) 98.6 F (37 C) 98.4 F (36.9 C) 97.4 F (36.3 C)  TempSrc: Oral Oral Oral Oral  SpO2: 98% 97% 98% 97%  Weight:      Height:       Constitutional: NAD, sitting up in chair CV: RRR, no murmurs, rubs or gallops Resp: CTAB, no increased work of breathing Abd: soft, NDNT Neuro: slurred speech improved from presentation. No appreciable residual right sided weakness from prior CVA. CN otherwise appear intact.   Assessment/Plan:  Active Problems:   Type 2 diabetes mellitus with renal manifestations (HCC)   Anemia in chronic kidney disease   CVA (cerebral vascular accident) ([place])  Acute left post putamen CVA: Patient with improvement of her speech, no new neurological symptoms over night. LDL 108. ECHO LVEF 65-70%, G1DD; no clot or regional wall motion abnormalities.  --f/u HbA1c --f/u Carotid dopplers --neuro following - appreciate their recc's --ASA, atorvastatin 80mg  daily --permissive HTN <220/110 --f/u PT/OT/SLP evals  T2DM: AM glucose 270. --Lantus 35U qhs --SSI-S TID wc --f/u HbA1c  Anemia of chronic disease: --continue home Fe-SO4 325mg  daily  CKD stage 4: Cr 3.2 this AM, this seems to be about her new baseline as of November 2017. --f/u AM BMP  Chronic diastolic CHF: Patient with improved diastolic  function on today's ECHO - G1DD.  --holding home Coreg 25mg  BID and Lasix 80mg  BID in setting of permissive hypertension.  --daily weights  MDD: --continue home bupropion 300mg  daily  Chronic back pain: --stable; holding metaxalone and oxycodone for now  Dispo: Anticipated discharge in approximately 1-2 day(s).   Alphonzo Grieve, MD 12/20/2016, 2:45 PM Pager [phone]

## 2016-12-20 NOTE — Progress Notes (Signed)
VASCULAR LAB PRELIMINARY  PRELIMINARY  PRELIMINARY  PRELIMINARY  Carotid duplex completed.    Preliminary report:  1-39% ICA plaquing.  Vertebral artery flow is antegrade.   Othel Dicostanzo, RVT 12/20/2016, 11:27 AM

## 2016-12-20 NOTE — Discharge Summary (Signed)
Name: [name] MRN: [id number] DOB: 01-14-1951 66 y.o. PCP: Katherina Mires, MD  Date of Admission: 12/19/2016 11:31 AM Date of Discharge: 12/20/2016 Attending Physician: Axel Filler, MD  Discharge Diagnosis: 1. Acute CVA Active Problems:   Type 2 diabetes mellitus with renal manifestations (HCC)   Anemia in chronic kidney disease   CVA (cerebral vascular accident) [facility])   Discharge Medications: Allergies as of 12/20/2016   No Known Allergies     Medication List    STOP taking these medications   oxyCODONE 5 MG immediate release tablet Commonly known as:  ROXICODONE     TAKE these medications   ACCU-CHEK AVIVA PLUS test strip Generic drug:  glucose blood USE ONE STRIP TO CHECK GLUCOSE BEFORE MEALS AND AT BEDTIME   atorvastatin 80 MG tablet Commonly known as:  LIPITOR Take 1 tablet (80 mg total) by mouth daily at 6 PM. What changed:  Another medication with the same name was added. Make sure you understand how and when to take each.   atorvastatin 80 MG tablet Commonly known as:  LIPITOR Take 1 tablet (80 mg total) by mouth daily. What changed:  You were already taking a medication with the same name, and this prescription was added. Make sure you understand how and when to take each.   buPROPion 300 MG 24 hr tablet Commonly known as:  WELLBUTRIN XL Take 300 mg by mouth daily.   carvedilol 25 MG tablet Commonly known as:  COREG Take 25 mg by mouth every 12 (twelve) hours.   clopidogrel 75 MG tablet Commonly known as:  PLAVIX Take 1 tablet (75 mg total) by mouth daily.   ferrous sulfate 325 (65 FE) MG tablet Take 325 mg by mouth daily with breakfast.   furosemide 40 MG tablet Commonly known as:  LASIX Take 80 mg by mouth 2 (two) times daily.   insulin glargine 100 UNIT/ML injection Commonly known as:  LANTUS Inject 0.45 mLs (45 Units total) into the skin at bedtime. What changed:  how much to take   insulin lispro protamine-lispro (50-50)  100 UNIT/ML Susp injection Commonly known as:  HUMALOG 50/50 MIX Inject 5-8 Units into the skin See admin instructions. 5 units with breakfast, 8 units with lunch, 8 units with dinner   Insulin Pen Needle 31G X 8 MM Misc USE TO ADMINISTER INSULIN VIA LANTUS AND NOVOLOG 4 TIMES DAILY   metaxalone 800 MG tablet Commonly known as:  SKELAXIN Take 0.5-1 tablets (400-800 mg total) by mouth 3 (three) times daily. What changed:  when to take this   OXYGEN Inhale 2.5 L into the lungs as needed.       Disposition and follow-up:   [name] was discharged from [facility] in Good condition.  At the hospital follow up visit please address:  1.   --is the dysarthria improving?  --any new focal neurological signs? --is she taking plavix and atorvastatin every day? --Has HH PT contacted patient and started therapy? --Please optimize diabetes and hypertension management as necessary.  2.  Labs / imaging needed at time of follow-up: none  3.  Pending labs/ test needing follow-up: none  Follow-up Appointments: Follow-up Information    Schedule an appointment as soon as possible for a visit with Suzanna Obey, MD.   Specialty:  Family Medicine Why:  to be seen in 1-2 weeks Contact information: [address] [place] [address] [phone]  Hospital Course by problem list: Active Problems:   Type 2 diabetes mellitus with renal manifestations (HCC)   Anemia in chronic kidney disease   CVA (cerebral vascular accident) ([place])   Acute left posterior putamen CVA: Patient with PMH of CAD s/p PCI 1610, chronic diastolic CHF, CKD 4 s/p AVF, T2DM, HTN, HLD, obesity, PAD, L BKA, and h/o CVA with residual right sided weakness presented to the [facility] with acute onset of dysarthria where Code Stroke was ultimately called - her exam revealed slight facial droop and dysarthria which was already improving per family. Patient presented within tPA  window, however refused it at the time. CT head was negative for acute infarct or hemorrhage, while MRI did show a small acute infarct with no associated hemorrhage in the left posterior putamen. Further stroke work up included below. On day of discharge, patient had further improvement of her dysarthria and no new symptoms appeared. Neurology followed the patient throughout her hospitalization and recommended Plavix and atorvastatin 80mg  daily for stroke risk reduction.   T2DM: A1c on 2/1 was 9.7% which resulted after patient was discharged. She was discharged on her home dose of Lantus 50U qhs and Humalog 50/50 TID with meals.  Chronic diastolic CHF, HTN: Patient's ECHO during hospitalization showed LVEF 65-70% with G1DD. During hospitalization she was allowed permissive hypertension and at discharge she was continued on her carvedilol 25mg  BID and Lasix 80mg  BID.  Anemia or chronic disease: Patient was continued on home ferrous sulfate 325mg  daily.   CKD stage 4: Patient with what appears to be new baseline Cr of around 3.2. This remained stable.  MDD: Patient continued on home bupropion 300mg  daily.  Discharge Vitals:   BP (!) 156/50 (BP Location: Right Arm)   Pulse 74   Temp 97.4 F (36.3 C) (Oral)   Resp 17   Ht 5\' 2"  (1.575 m)   Wt 256 lb 9.9 oz (116.4 kg)   SpO2 97%   BMI 46.94 kg/m   Pertinent Labs, Studies, and Procedures:  CBC    Component Value Date/Time   WBC 5.5 12/19/2016 1132   RBC 3.46 (L) 12/19/2016 1132   HGB 10.2 (L) 12/19/2016 1137   HCT 30.0 (L) 12/19/2016 1137   PLT 178 12/19/2016 1132   MCV 91.6 12/19/2016 1132   MCH 29.2 12/19/2016 1132   MCHC 31.9 12/19/2016 1132   RDW 16.7 (H) 12/19/2016 1132   LYMPHSABS 1.6 12/19/2016 1132   MONOABS 0.3 12/19/2016 1132   EOSABS 0.3 12/19/2016 1132   BASOSABS 0.0 12/19/2016 1132   BMET    Component Value Date/Time   NA 139 12/20/2016 0424   K 4.4 12/20/2016 0424   CL 106 12/20/2016 0424   CO2 24  12/20/2016 0424   GLUCOSE 276 (H) 12/20/2016 0424   BUN 83 (H) 12/20/2016 0424   CREATININE 3.23 (H) 12/20/2016 0424   CREATININE 3.51 (H) 03/14/2015 1425   CALCIUM 9.4 12/20/2016 0424   CALCIUM 10.1 12/14/2016 1403   GFRNONAA 14 (L) 12/20/2016 0424   GFRAA 16 (L) 12/20/2016 0424   Lipid Panel     Component Value Date/Time   CHOL 197 12/20/2016 0424   TRIG 314 (H) 12/20/2016 0424   HDL 26 (L) 12/20/2016 0424   CHOLHDL 7.6 12/20/2016 0424   VLDL 63 (H) 12/20/2016 0424   LDLCALC 108 (H) 12/20/2016 0424   LDLDIRECT 115 (H) 08/31/2014 0935   Hgb A1c 12/20/2016: 9.7  ECHO 12/20/2016: LVEF 65-70%, G1DD; high ventricular filling pressure  CT  head 12/19/2016: No acute infarct. Moderate chronic ischemic changes.  MRI brain 12/19/2016: Acute small infarct within left posterior putamen extending into posterior corona radiata. No associated hemorrhage identified.  Chronic right cerebellar and occipital infarcts. Advanced chronic microvascular ischemic changes and moderate brain parenchymal volume loss - stable.  MRA head 12/19/2016: Motion degraded study. No large vessel occlusion, high grade stenosis, or aneurysm in the anterior or posterior circulation.  Discharge Instructions: Discharge Instructions    Call MD for:  extreme fatigue    Complete by:  As directed    Call MD for:  persistant dizziness or light-headedness    Complete by:  As directed    Diet - low sodium heart healthy    Complete by:  As directed    Discharge instructions    Complete by:  As directed    Please take atorvastatin 80mg  daily and plavix (clopidogrel) 75mg  daily to help reduce your risk for another stroke.   STOP taking aspirin.  Please follow up with your primary care provider in about 1-2 weeks; they will help manage your diet, diabetes, and blood pressure. This will also help reduce the risk for another stroke.   Somebody from home health will contact you about setting up physical therapy.   Increase  activity slowly    Complete by:  As directed       Signed: Alphonzo Grieve, MD 12/20/2016, 5:36 PM   Pager [phone]

## 2016-12-20 NOTE — Progress Notes (Signed)
Inpatient Diabetes Program Recommendations  AACE/ADA: New Consensus Statement on Inpatient Glycemic Control (2015)  Target Ranges:  Prepandial:   less than 140 mg/dL      Peak postprandial:   less than 180 mg/dL (1-2 hours)      Critically ill patients:  140 - 180 mg/dL   Lab Results  Component Value Date   GLUCAP 271 (H) 12/20/2016   HGBA1C 8.2 (H) 08/06/2014   Results for LAMANDA, RUDDER (MRN [id number]) as of 12/20/2016 10:37  Ref. Range 12/19/2016 11:48 12/19/2016 19:03 12/19/2016 20:27 12/20/2016 07:53  Glucose-Capillary Latest Ref Range: 65 - 99 mg/dL 272 (H) 163 (H) 272 (H) 271 (H)    Review of Glycemic Control  Diabetes history:     DM2, CKD stage 4, Obese Outpatient Diabetes medications:     Lantus 50 units QHS,     Novolog 50/50 Mix 5-8 units TIDAC Current orders for Inpatient glycemic control:     Sensitive correction scale Novolog 0-9 units TIDAC    Lantus 30 units QHS  Inpatient Diabetes Program Recommendations, please consider:     Increasing Lantus to 40 units QHS;    Adding Meal coverage of Novolog 5 units TIDAC if patient eats > 50% of meal.  Thank you,  Windy Carina, RN, MSN Diabetes Coordinator Inpatient Diabetes Program [phone] (Team Pager)

## 2016-12-20 NOTE — Progress Notes (Signed)
PT Cancellation Note  Patient Details Name: [name] MRN: [id number] DOB: 07-22-1951   Cancelled Treatment:    Reason Eval/Treat Not Completed: Patient at procedure or test/unavailable.  Pt having and Echo and going to vascular. Will try to eval today as able. 12/20/2016  Donnella Sham, [place] [phone]  (pager)   [name] [name] 12/20/2016, 11:56 AM

## 2016-12-21 ENCOUNTER — Inpatient Hospital Stay (HOSPITAL_COMMUNITY): Admission: RE | Admit: 2016-12-21 | Payer: Medicare Other | Source: Ambulatory Visit

## 2016-12-21 LAB — HEMOGLOBIN A1C
HEMOGLOBIN A1C: 9.7 % — AB (ref 4.8–5.6)
Mean Plasma Glucose: 232 mg/dL

## 2016-12-21 NOTE — Care Management Note (Signed)
Case Management Note  Patient Details  Name: [name] MRN: [id number] Date of Birth: 01-14-1951  Subjective/Objective:                    Action/Plan: Pt  d/c with home health services (PT).  Expected Discharge Date:  12/20/16               Expected Discharge Plan:  [name]  In-House Referral:     Discharge planning Services  CM Consult  Post Acute Care Choice:    Choice offered to:  Patient/ [name] ( daughter)  DME Arranged:    DME Agency:     HH Arranged:  PT [place]:  [place] (referral made to [facility] [phone])  Status of Service:  Completed, signed off  If discussed at [place] of Stay Meetings, dates discussed:    Additional Comments:  Sharin Mons, RN 12/21/2016, 9:05 AM

## 2016-12-25 LAB — VAS US CAROTID
LCCADDIAS: -9 cm/s
LCCADSYS: -69 cm/s
LCCAPSYS: 90 cm/s
LEFT ECA DIAS: -9 cm/s
LEFT VERTEBRAL DIAS: 14 cm/s
LICADSYS: -49 cm/s
Left CCA prox dias: 4 cm/s
Left ICA dist dias: -10 cm/s
Left ICA prox dias: -17 cm/s
Left ICA prox sys: -69 cm/s
RCCADSYS: 50 cm/s
RCCAPDIAS: 9 cm/s
RCCAPSYS: 81 cm/s
RIGHT ECA DIAS: -1 cm/s
RIGHT VERTEBRAL DIAS: -4 cm/s

## 2017-01-16 ENCOUNTER — Ambulatory Visit (HOSPITAL_COMMUNITY)
Admission: RE | Admit: 2017-01-16 | Discharge: 2017-01-16 | Disposition: A | Discharge status: Home / Self Care | Payer: Medicare Other | Source: Ambulatory Visit | Attending: Nephrology | Admitting: Nephrology

## 2017-01-16 DIAGNOSIS — N184 Chronic kidney disease, stage 4 (severe): Secondary | ICD-10-CM | POA: Diagnosis not present

## 2017-01-16 LAB — RENAL FUNCTION PANEL
ANION GAP: 8 (ref 5–15)
Albumin: 3 g/dL — ABNORMAL LOW (ref 3.5–5.0)
BUN: 92 mg/dL — ABNORMAL HIGH (ref 6–20)
CALCIUM: 9.5 mg/dL (ref 8.9–10.3)
CO2: 23 mmol/L (ref 22–32)
Chloride: 108 mmol/L (ref 101–111)
Creatinine, Ser: 2.93 mg/dL — ABNORMAL HIGH (ref 0.44–1.00)
GFR calc Af Amer: 18 mL/min — ABNORMAL LOW (ref >60)
GFR calc non Af Amer: 16 mL/min — ABNORMAL LOW (ref >60)
GLUCOSE: 260 mg/dL — AB (ref 65–99)
PHOSPHORUS: 4.4 mg/dL (ref 2.5–4.6)
Potassium: 4.1 mmol/L (ref 3.5–5.1)
SODIUM: 139 mmol/L (ref 135–145)

## 2017-01-16 LAB — IRON AND TIBC
Iron: 68 ug/dL (ref 28–170)
SATURATION RATIOS: 29 % (ref 10.4–31.8)
TIBC: 237 ug/dL — ABNORMAL LOW (ref 250–450)
UIBC: 169 ug/dL

## 2017-01-16 LAB — FERRITIN: Ferritin: 1259 ng/mL — ABNORMAL HIGH (ref 11–307)

## 2017-01-16 LAB — POCT HEMOGLOBIN-HEMACUE: Hemoglobin: 8.6 g/dL — ABNORMAL LOW (ref 12.0–15.0)

## 2017-01-16 MED ORDER — EPOETIN ALFA 20000 UNIT/ML IJ SOLN
20000.0000 [IU] | INTRAMUSCULAR | Status: DC
Start: 2017-01-16 — End: 2017-01-17
  Administered 2017-01-16: 20000 [IU] via SUBCUTANEOUS

## 2017-01-16 MED ORDER — EPOETIN ALFA 20000 UNIT/ML IJ SOLN
INTRAMUSCULAR | Status: AC
Start: 2017-01-16 — End: 2017-01-16
  Filled 2017-01-16: qty 1

## 2017-01-17 LAB — PTH, INTACT AND CALCIUM
Calcium, Total (PTH): 9.3 mg/dL (ref 8.7–10.3)
PTH: 156 pg/mL — AB (ref 15–65)

## 2017-01-23 ENCOUNTER — Ambulatory Visit (HOSPITAL_COMMUNITY)
Admission: RE | Admit: 2017-01-23 | Discharge: 2017-01-23 | Disposition: A | Discharge status: Home / Self Care | Payer: Medicare Other | Source: Ambulatory Visit | Attending: Nephrology | Admitting: Nephrology

## 2017-01-23 DIAGNOSIS — N184 Chronic kidney disease, stage 4 (severe): Secondary | ICD-10-CM | POA: Insufficient documentation

## 2017-01-23 LAB — POCT HEMOGLOBIN-HEMACUE: Hemoglobin: 8.9 g/dL — ABNORMAL LOW (ref 12.0–15.0)

## 2017-01-23 MED ORDER — EPOETIN ALFA 20000 UNIT/ML IJ SOLN
20000.0000 [IU] | INTRAMUSCULAR | Status: DC
  Administered 2017-01-23: 20000 [IU] via SUBCUTANEOUS

## 2017-01-23 MED ORDER — EPOETIN ALFA 20000 UNIT/ML IJ SOLN
INTRAMUSCULAR | Status: AC
  Filled 2017-01-23: qty 1

## 2017-01-30 ENCOUNTER — Encounter (HOSPITAL_COMMUNITY): Payer: Medicare Other

## 2017-02-06 ENCOUNTER — Ambulatory Visit: Payer: Medicare Other | Admitting: Cardiology

## 2017-02-21 ENCOUNTER — Ambulatory Visit (INDEPENDENT_AMBULATORY_CARE_PROVIDER_SITE_OTHER): Payer: Medicare Other | Admitting: Pharmacist Clinician (PhC)/ Clinical Pharmacy Specialist

## 2017-02-21 ENCOUNTER — Ambulatory Visit (INDEPENDENT_AMBULATORY_CARE_PROVIDER_SITE_OTHER): Payer: Medicare Other | Admitting: Cardiology

## 2017-02-21 ENCOUNTER — Encounter: Payer: Self-pay | Admitting: Cardiology

## 2017-02-21 ENCOUNTER — Encounter: Payer: Self-pay | Admitting: Pharmacist Clinician (PhC)/ Clinical Pharmacy Specialist

## 2017-02-21 VITALS — BP 155/64 | HR 63 | Ht 62.0 in | Wt 254.0 lb

## 2017-02-21 DIAGNOSIS — E785 Hyperlipidemia, unspecified: Secondary | ICD-10-CM

## 2017-02-21 DIAGNOSIS — I252 Old myocardial infarction: Secondary | ICD-10-CM

## 2017-02-21 DIAGNOSIS — E1159 Type 2 diabetes mellitus with other circulatory complications: Secondary | ICD-10-CM

## 2017-02-21 DIAGNOSIS — I63032 Cerebral infarction due to thrombosis of left carotid artery: Secondary | ICD-10-CM

## 2017-02-21 DIAGNOSIS — I5032 Chronic diastolic (congestive) heart failure: Secondary | ICD-10-CM

## 2017-02-21 DIAGNOSIS — I739 Peripheral vascular disease, unspecified: Secondary | ICD-10-CM

## 2017-02-21 DIAGNOSIS — I2510 Atherosclerotic heart disease of native coronary artery without angina pectoris: Secondary | ICD-10-CM

## 2017-02-21 DIAGNOSIS — Z9861 Coronary angioplasty status: Secondary | ICD-10-CM | POA: Diagnosis not present

## 2017-02-21 DIAGNOSIS — I10 Essential (primary) hypertension: Secondary | ICD-10-CM | POA: Diagnosis not present

## 2017-02-21 MED ORDER — EZETIMIBE 10 MG PO TABS: 10.0000 mg | ORAL_TABLET | Freq: Every day | ORAL | 3 refills | Status: DC

## 2017-02-21 NOTE — Assessment & Plan Note (Signed)
Despite having had a large MI, she is not had any further anginal symptoms. Her echo shows well-preserved EF with no regional wall motion abnormalities. No recurrent anginal symptoms at this point.

## 2017-02-21 NOTE — Assessment & Plan Note (Signed)
Appears to be euvolemic on exam. No active heart failure symptoms. Need to closely monitor blood pressure. She is on stable dose of Lasix which she is taking routinely but not needing additional dosing.

## 2017-02-21 NOTE — Assessment & Plan Note (Signed)
No real claudication symptoms mostly because she doesn't walk much. She is due for recheck Dopplers.

## 2017-02-21 NOTE — Assessment & Plan Note (Signed)
Status post recent stroke. Improving slowly. Remains somewhat immobilized at baseline. No clear reason to check any further cardiac evaluation since TEE. No sensations to suggest atrial fibrillation, therefore we will hold off on checking monitor as well. Continue higher dose statin and Plavix.

## 2017-02-21 NOTE — Assessment & Plan Note (Signed)
Patient with elevated LDL cholesterol on atorvastatin 40 mg.  At her last appointment was increased to 80 mg daily.  Since then, patient has been hospitalized with another stroke.  Her LDL cholesterol was at 72 on atorvastatin 80 about a year ago.  Will add ezetimibe 10 mg daily and repeat labs in 6 weeks.  With her significant history, will start her on PCSK-9 inhibitor at that time if her LDL is still not to goal at < 70.  She also knows that she is to call the office should she not be able to afford ezetimibe.  Triglycerides were also elevated at her last check, but hemoglobin A1c at that time was 9.7.

## 2017-02-21 NOTE — Assessment & Plan Note (Signed)
Still doing well with no active angina symptoms on aspirin, statin and beta blocker. If her blood pressure continues to be elevated, we can consider restarting another medication which would probably be better suited to be amlodipine. She is also on now increased dose of atorvastatin along with Plavix.

## 2017-02-21 NOTE — Patient Instructions (Addendum)
Medication Instructions: no changes   Labwork:  FLP in 6 weeks   Testing/Procedures:  Your physician has requested that you have an abdominal aorta duplex. During this test, an ultrasound is used to evaluate the aorta. Allow 30 minutes for this exam. Do not eat after midnight the day before and avoid carbonated beverages  Your physician has requested that you have a lower extremity arterial duplex. This test is an ultrasound of the arteries in the legs or arms. It looks at arterial blood flow in the legs and arms. Allow one hour for Lower and Upper Arterial scans. There are no restrictions or special instructions  Your physician has requested that you have an ankle brachial index (ABI). During this test an ultrasound and blood pressure cuff are used to evaluate the arteries that supply the arms and legs with blood. Allow thirty minutes for this exam. There are no restrictions or special instructions.    Follow-Up: in 6 months with Dr. Ellyn Hack   Any Other Special Instructions Will Be Listed Below (If Applicable).     If you need a refill on your cardiac medications before your next appointment, please call your pharmacy.

## 2017-02-21 NOTE — Assessment & Plan Note (Signed)
Borderline control again today, but usually is much better controlled on current meds. They were allowing some permissive hypertension in the hospital. Currently only on carvedilol. If pressures continue be elevated, we may need to add calcium channel blocker. With her renal function I would be reluctant to use ACE inhibitor or ARB.

## 2017-02-21 NOTE — Assessment & Plan Note (Signed)
Apparently her atorvastatin dose was reduced to 40 mg but I last saw her. She is now on 80 mg. In the past she had relatively well-controlled cholesterol levels. We will need to recheck lipids. I've asked her to follow with Rockne Menghini, RPH-CPP who has seen today. - plan is to add Zetia & recheck labs in 6-8 weeks.    Pending results, may consider PCSK9-I

## 2017-02-21 NOTE — Progress Notes (Signed)
02/21/2017 [name] 1950-12-25 [id number]   HPI:  [name] is a 66 y.o. female patient of Dr. Ellyn Hack, who is in the office to see him today. Because of her elevated LDL cholesterol, despite high intensity statin therapy, we were asked to initiate Repatha therapy.   Her cardiac history is significant for an inferior STEMI in May 2010, stage 4 CKD, CVA,  PAD, DM-2, hypertension and hyperlipidemia.  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      Current Medications:  Atorvastatin 80 mg daily  Cholesterol Goals:   LDL < 70  Family history:   Father died at age 23 from cancer, mother at 42 from pulmonary issues (she only had 1 lung at the time).  One sister died from MI at age 17.  Diet:   Mix of home cooked meals and eating out.  Admits to some fried foods (french fries); does not add salt to any meals or when cooking  Exercise:    Unable due to balance issues d/t amputation; was previously in therapy  Labs:   12/20/2016:  TC 197, TG 314, HDL 26, LDL 108 (pt taking 40 mg atorvastatin)    Current Outpatient Prescriptions  Medication Sig Dispense Refill  . atorvastatin (LIPITOR) 80 MG tablet Take 1 tablet (80 mg total) by mouth daily at 6 PM. 30 tablet 2  . buPROPion (WELLBUTRIN XL) 300 MG 24 hr tablet Take 300 mg by mouth daily.    . carvedilol (COREG) 25 MG tablet Take 25 mg by mouth every 12 (twelve) hours.     . clopidogrel (PLAVIX) 75 MG tablet Take 1 tablet (75 mg total) by mouth daily. 30 tablet 2  . ezetimibe (ZETIA) 10 MG tablet Take 1 tablet (10 mg total) by mouth daily. 30 tablet 3  . ferrous sulfate 325 (65 FE) MG tablet Take 325 mg by mouth daily with breakfast.    . furosemide (LASIX) 40 MG tablet Take 80 mg by mouth 2 (two) times daily.     Marland Kitchen glucose blood (ACCU-CHEK AVIVA PLUS) test strip USE ONE STRIP TO CHECK GLUCOSE BEFORE MEALS AND AT BEDTIME    . insulin glargine (LANTUS) 100 UNIT/ML  injection Inject 0.45 mLs (45 Units total) into the skin at bedtime. (Patient taking differently: Inject 50 Units into the skin at bedtime. ) 10 mL 11  . insulin lispro protamine-lispro (HUMALOG 50/50 MIX) (50-50) 100 UNIT/ML SUSP injection Inject 5-8 Units into the skin See admin instructions. 5 units with breakfast, 8 units with lunch, 8 units with dinner    . Insulin Pen Needle 31G X 8 MM MISC USE TO ADMINISTER INSULIN VIA LANTUS AND NOVOLOG 4 TIMES DAILY    . metaxalone (SKELAXIN) 800 MG tablet Take 0.5-1 tablets (400-800 mg total) by mouth 3 (three) times daily. (Patient taking differently: Take 400-800 mg by mouth daily. ) 10 tablet 0  . OXYGEN Inhale 2.5 L into the lungs as needed.      No current facility-administered medications for this visit.     No Known Allergies  Past Medical History:  Diagnosis Date  . Anemia   . Arthritis    "right shoulder" (12/19/2016)  . CAD S/P percutaneous coronary angioplasty 5/110    status post PCI to the RCA for inferior STEMI  . Cervical cancer ([place])   . CHF (congestive heart failure) ([place])   . Chronic diastolic heart failure, NYHA  class 2 ([place])    Echocardiogram 06/2014:  Technically difficult study. Normal LV size with mild LVH. EF 55-60%. Moderate diastolic dysfunction, normal RV size and function. Likely aortic sclerosis without stenosis  . Chronic lower back pain   . CKD (chronic kidney disease), stage IV ([place])    Recent acute on chronic exacerbation in July 2014  . Depression   . Diabetes mellitus, type II, insulin dependent ([place])    With complications - CAD, CVA, peripheral ulcer ,  . Diabetic foot ulcer ([place])   . Diabetic peripheral neuropathy associated with type 2 diabetes mellitus ([place])   . Dry gangrene (HCC)    Left second toe; now on Sole of L foot  . History of blood transfusion    "w/hysterectomy"   . Hyperlipidemia LDL goal <70   . Hypertension associated with diabetes ([place])   . Obesity, Class III, BMI 40-49.9 (morbid  obesity) (HCC)    BMI 43; 5' 2',  235 pounds 6.4 ounces  . On home oxygen therapy    "2.5L prn; sometimes as little as once/month" (12/19/2016)  . PAD (peripheral artery disease) ([place])     -- Most recent Dopplers April 2016 Status post left BKA. Known right PTA occlusion  . Pneumonia    "childhood"  . ST elevation myocardial infarction (STEMI) of inferior wall ([place])  03/2009   Ostial/proximal RCA occlusion treated with BMS stent  . Stroke ([place]) 12/19/2016   "slurring" (12/19/2016)  . Stroke/cerebrovascular accident [facility])  4/30/ 2014   Right-sided weakness, mostly with balance issues and only mild weakness.    There were no vitals taken for this visit.   Hyperlipidemia LDL goal <70 Patient with elevated LDL cholesterol on atorvastatin 40 mg.  At her last appointment was increased to 80 mg daily.  Since then, patient has been hospitalized with another stroke.  Her LDL cholesterol was at 72 on atorvastatin 80 about a year ago.  Will add ezetimibe 10 mg daily and repeat labs in 6 weeks.  With her significant history, will start her on PCSK-9 inhibitor at that time if her LDL is still not to goal at < 70.  She also knows that she is to call the office should she not be able to afford ezetimibe.  Triglycerides were also elevated at her last check, but hemoglobin A1c at that time was 9.7.     Tommy Medal PharmD CPP [facility]

## 2017-02-21 NOTE — Progress Notes (Signed)
PCP: Suzanna Obey, MD Orthopedic Surgeon: Dr. Sharol Given Vascular Surgeon: Dr. Deitra Mayo Nephrology: Dr. Moshe Cipro  Clinic Note: Chief Complaint  Patient presents with  . Hospitalization Follow-up    recent CVA  . Coronary Artery Disease  . PAD  . Hyperlipidemia    HPI: [name] is a 66 y.o. female with a PMH below who presents today for annual f/u for CAD-InfSTEMI, Chronic HFPEF, CKD-4, PAD, DM-2 & HTN/HLD. - has had LUE HD fistula placed.  Also has h/o CVA - residual R sided weakness.   Inf STEMI May 2010 -- BMS-RCA  CVA in April 2014  PAD- L BKA in Sept 7824 - for complications of diabetic foot ulcer with dry gangrene  [name] was last seen in march 2017 - no complaints.  Limited by amputation.  Baseline dyspnea.  Sleeps 2-3 pillows for comfort - not Orthopnea.  Recent Hospitalizations:   Slurred speech - Jan 31, '08: acute CVA (posterior putamen). Refused tPA.  D/c on Plavix & atorvastatin increased to 80 mg.  Studies Reviewed:   2 D Echo: LVEF 65-70% with G1DD. No RWMA. Mod LA dilation  MRI brain 12/19/2016:  Acute small infarct within left posterior putamen extending into posterior corona radiata. No associated hemorrhage identified. Chronic right cerebellar and occipital infarcts.  Advanced chronic microvascular ischemic changes and moderate brain parenchymal volume loss - stable.   MRA head 12/19/2016: Motion degraded study. No large vessel occlusion, high grade stenosis, or aneurysm in the anterior or posterior circulation   Carotid Dopplers 12/20/2016: Bilateral: intimal wall thickening CCA. Right: mild mixed plaque @ origin ICA. Left: mild to moderate mixed plaque origin ICA with acoustic shadowing. 1-39% ICA plaquing, bilaterally. Vertebral artery flow is antegrade.  Interval History: [name] returns today for cardiac evaluation following her recent stroke. She has not had any significant cardiac symptoms to speak of. She is still essentially  limited because she doesn't walk around much with her prosthetic foot. Currently she is dealing with a cold and has some congestion which made her more short of breath and usual, but doesn't note any resting dyspnea or chest tightness/pressure. He denies any headaches or blurred vision. Her speech is improving dramatically. Minimal residual effect from her recent stroke. No sensation of rapid irregular heartbeat/palpitations, syncope/near syncope or TIAs amaurosis fugax symptoms since discharge. She doesn't walk around much because of generalized immobility, with what she does do she denies any exertional chest tightness or pressure. She has some exertional dyspnea but that is no change from baseline. No PND, orthopnea or edema.   No melena, hematochezia, hematuria, or epstaxis. No claudication.  ROS: A comprehensive was performed. Review of Systems  HENT: Negative for congestion, hearing loss, nosebleeds and sinus pain.   Respiratory: Positive for shortness of breath.   Musculoskeletal: Positive for joint pain.  Neurological: Positive for dizziness (positional - occasiona.  - balance issues) and speech change (Speech is slowly improving, still has some difficulty with saying words.). Negative for focal weakness.  Psychiatric/Behavioral: Positive for memory loss.  All other systems reviewed and are negative.   Past Medical History:  Diagnosis Date  . Anemia   . Arthritis    "right shoulder" (12/19/2016)  . CAD S/P percutaneous coronary angioplasty 5/110    status post PCI to the RCA for inferior STEMI  . Cervical cancer ([place])   . Chronic diastolic heart failure, NYHA class 2 ([place])    Echocardiogram 06/2014:  Technically difficult study. Normal LV size with mild LVH. EF  55-60%. Moderate diastolic dysfunction, normal RV size and function. Likely aortic sclerosis without stenosis  . Chronic lower back pain   . CKD (chronic kidney disease), stage IV ([place])    Recent acute on chronic  exacerbation in July 2014  . Depression   . Diabetes mellitus, type II, insulin dependent ([place])    With complications - CAD, CVA, peripheral ulcer ,  . Diabetic foot ulcer ([place])   . Diabetic peripheral neuropathy associated with type 2 diabetes mellitus ([place])   . 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 History of blood transfusion    "w/hysterectomy"   . Hyperlipidemia LDL goal <70   . Hypertension associated with diabetes ([place])   . Obesity, Class III, BMI 40-49.9 (morbid obesity) (HCC)    BMI 43; 5' 2',  235 pounds 6.4 ounces  . On home oxygen therapy    "2.5L prn; sometimes as little as once/month" (12/19/2016)  . PAD (peripheral artery disease) ([place])     -- Most recent Dopplers April 2016 Status post left BKA. Known right PTA occlusion  . Pneumonia    "childhood"  . ST elevation myocardial infarction (STEMI) of inferior wall ([place])  03/2009   Ostial/proximal RCA occlusion treated with BMS stent  . Stroke/cerebrovascular accident ([place]) 03/18/2013; 12/19/2016   a. Right-sided weakness, mostly with balance issues and only mild weakness;; b. Slurring speach -- L Posterior Putamen CVA    Past Surgical History:  Procedure Laterality Date  . ABDOMINAL HYSTERECTOMY    . AMPUTATION Left 08/07/2013   Procedure: left midfoot amputation;  Surgeon: Marianna Payment, MD;  Location: [facility];  Service: Orthopedics;  Laterality: Left;  . AMPUTATION Left 08/09/2013   Procedure: AMPUTATION BELOW KNEE;  Surgeon: Marianna Payment, MD;  Location: [facility];  Service: Orthopedics;  Laterality: Left;  . AV FISTULA PLACEMENT Left 12/03/2014   Procedure: ARTERIOVENOUS (AV) FISTULA CREATION;  Surgeon: Angelia Mould, MD;  Location: [facility];  Service: Vascular;  Laterality: Left;  . CARDIAC CATHETERIZATION  03/31/2009   Proximal RCA thrombotic occlusion (inferior STEMI) other coronaries but in a codominant system  . EYE SURGERY     "bleeding; vessels leaking"  .  [place]   "broke her leg in 2 places when she was young"  . LEG SURGERY     hole in bone( during Childhood)  . LEG TENDON SURGERY Right 2000s   patient fell @ [facility]  . PERCUTANEOUS CORONARY STENT INTERVENTION (PCI-S)  03/31/2009   PTCA , bare-metal stent 3.5 mm x 24 mm ostium of RCA ,EF 55%  . TRANSTHORACIC ECHOCARDIOGRAM  06/2014; 12/2016   a. Technically difficult study. Normal LV size with mild LVH. EF 55-60%. Mod - GR 2 DD. Nl RV size & fxn. ~ Aortic Sclerosis w/ stenosis;; b. LVEF 65-70%, GR 1-2 DD. No RWMA, Mod LA Dilation    Current Meds  Medication Sig  . atorvastatin (LIPITOR) 80 MG tablet Take 1 tablet (80 mg total) by mouth daily at 6 PM.  . buPROPion (WELLBUTRIN XL) 300 MG 24 hr tablet Take 300 mg by mouth daily.  . carvedilol (COREG) 25 MG tablet Take 25 mg by mouth every 12 (twelve) hours.   . clopidogrel (PLAVIX) 75 MG tablet Take 1 tablet (75 mg total) by mouth daily.  . ferrous sulfate 325 (65 FE) MG tablet Take 325 mg by mouth daily with breakfast.  . furosemide (  LASIX) 40 MG tablet Take 80 mg by mouth 2 (two) times daily.   Marland Kitchen glucose blood (ACCU-CHEK AVIVA PLUS) test strip USE ONE STRIP TO CHECK GLUCOSE BEFORE MEALS AND AT BEDTIME  . insulin glargine (LANTUS) 100 UNIT/ML injection Inject 0.45 mLs (45 Units total) into the skin at bedtime. (Patient taking differently: Inject 50 Units into the skin at bedtime. )  . insulin lispro protamine-lispro (HUMALOG 50/50 MIX) (50-50) 100 UNIT/ML SUSP injection Inject 5-8 Units into the skin See admin instructions. 5 units with breakfast, 8 units with lunch, 8 units with dinner  . Insulin Pen Needle 31G X 8 MM MISC USE TO ADMINISTER INSULIN VIA LANTUS AND NOVOLOG 4 TIMES DAILY  . metaxalone (SKELAXIN) 800 MG tablet Take 0.5-1 tablets (400-800 mg total) by mouth 3 (three) times daily. (Patient taking differently: Take 400-800 mg by mouth daily. )  . OXYGEN Inhale 2.5 L into the lungs as needed.     No Known  Allergies  Social History   Social History  . Marital status: Widowed    Spouse name: N/A  . Number of children: 4  . Years of education: N/A   Occupational History  .  Unemployed   Social History Main Topics  . Smoking status: Former Smoker    Quit date: 04/29/1995  . Smokeless tobacco: Never Used  . Alcohol use No  . Drug use: No  . Sexual activity: No   Other Topics Concern  . None   Social History Narrative   Was previously living in an assisted living center while recovering from her stroke. Lesion is now recently moved back home with her family.    She is attended by her daughter, who is super morbidly obese.    family history includes Alcoholism in her father and mother; Cancer in her father; Diabetes in her father; Hypertension in her father; Pneumonia in her father.  Wt Readings from Last 3 Encounters:  02/21/17 254 lb (115.2 kg)  12/19/16 256 lb 9.9 oz (116.4 kg)  10/05/16 252 lb (114.3 kg)    PHYSICAL EXAM BP (!) 155/64   Pulse 63   Ht 5\' 2"  (1.575 m)   Wt 254 lb (115.2 kg)   SpO2 98%   BMI 46.46 kg/m  General appearance: alert, cooperative, appears stated age, no distress and morbidly obese ; well groomed. Poor Historian. HEENT: /AT, EOMI, MMM, anicteric sclera; very slight right-sided facial droop. Neck: no adenopathy, no carotid bruit and no JVD Lungs: clear to auscultation bilaterally, normal percussion bilaterally and non-labored Heart: regular rate and rhythm, S1& S2 normal, + S4, No M/R. Very distant heart sounds due to body habitus Abdomen: soft, non-tender; bowel sounds normal; no masses, unable to palpate organomegaly. Extremities: status post left BKA with prosthetic in place. Right leg has weak, but palpable DP pulse. 1-2+ edema. Left brachiocephalic AV fistula with normal thrill. Neurologic: Mental status: Alert, oriented, thought content appropriate; Cranial nerves: Mild facial droop. Otherwise grossly normal. Psych: somewhat blunted  mood and affect.    Adult ECG Report n/a  Other studies Reviewed: Additional studies/ records that were reviewed today include:  Recent Labs: * -- up from 2016 Lab Results  Component Value Date   CHOL 197 12/20/2016   HDL 26 (L) 12/20/2016   LDLCALC 108 (H) 12/20/2016   LDLDIRECT 115 (H) 08/31/2014   TRIG 314 (H) 12/20/2016   CHOLHDL 7.6 12/20/2016    Lab Results  Component Value Date   CREATININE 2.93 (H) 01/16/2017  BUN 92 (H) 01/16/2017   NA 139 01/16/2017   K 4.1 01/16/2017   CL 108 01/16/2017   CO2 23 01/16/2017   Lab Results  Component Value Date   HGBA1C 9.7 (H) 12/20/2016    ASSESSMENT / PLAN: Problem List Items Addressed This Visit    CAD S/P PCI TO RCA for Inferior STEMI. BMS - Primary (Chronic)    Still doing well with no active angina symptoms on aspirin, statin and beta blocker. If her blood pressure continues to be elevated, we can consider restarting another medication which would probably be better suited to be amlodipine. She is also on now increased dose of atorvastatin along with Plavix.      Relevant Orders   VAS [place] AAA DUPLEX   VAS [place] ABI WITH/WO TBI   VAS [place] LOWER EXTREMITY ARTERIAL DUPLEX   Chronic diastolic congestive heart failure, NYHA class 2 (HCC) (Chronic)    Appears to be euvolemic on exam. No active heart failure symptoms. Need to closely monitor blood pressure. She is on stable dose of Lasix which she is taking routinely but not needing additional dosing.      CVA (cerebral vascular accident) [facility])    Status post recent stroke. Improving slowly. Remains somewhat immobilized at baseline. No clear reason to check any further cardiac evaluation since TEE. No sensations to suggest atrial fibrillation, therefore we will hold off on checking monitor as well. Continue higher dose statin and Plavix.      Hyperlipidemia LDL goal <70 (Chronic)    Apparently her atorvastatin dose was reduced to 40 mg but I last saw her. She is now on 80 mg.  In the past she had relatively well-controlled cholesterol levels. We will need to recheck lipids. I've asked her to follow with Rockne Menghini, RPH-CPP who has seen today. - plan is to add Zetia & recheck labs in 6-8 weeks.    Pending results, may consider [id number]      Relevant Orders   VAS [place] AAA DUPLEX   VAS [place] ABI WITH/WO TBI   VAS [place] LOWER EXTREMITY ARTERIAL DUPLEX   Hypertension associated with diabetes ([place]) (Chronic)    Borderline control again today, but usually is much better controlled on current meds. They were allowing some permissive hypertension in the hospital. Currently only on carvedilol. If pressures continue be elevated, we may need to add calcium channel blocker. With her renal function I would be reluctant to use ACE inhibitor or ARB.      Peripheral arterial disease: s/p L BKA; Occluded R Pop A, 2 V runnoff (Chronic)    No real claudication symptoms mostly because she doesn't walk much. She is due for recheck Dopplers.      Relevant Orders   VAS [place] AAA DUPLEX   VAS [place] ABI WITH/WO TBI   VAS [place] LOWER EXTREMITY ARTERIAL DUPLEX   Severe obesity (BMI >= 40) (HCC) (Chronic)   Status post myocardial infarction of inferior wall (Chronic)    Despite having had a large MI, she is not had any further anginal symptoms. Her echo shows well-preserved EF with no regional wall motion abnormalities. No recurrent anginal symptoms at this point.         Current medicines are reviewed at length with the patient today. (+/- concerns) n/a The following changes have been made: Adding Zetia per Tommy Medal, PRH-CCP  Patient Instructions  Medication Instructions: no changes   Labwork:  FLP in 6 weeks   Testing/Procedures:  Your  physician has requested that you have an abdominal aorta duplex. During this test, an ultrasound is used to evaluate the aorta. Allow 30 minutes for this exam. Do not eat after midnight the day before and avoid carbonated beverages  Your  physician has requested that you have a lower extremity arterial duplex. This test is an ultrasound of the arteries in the legs or arms. It looks at arterial blood flow in the legs and arms. Allow one hour for Lower and Upper Arterial scans. There are no restrictions or special instructions  Your physician has requested that you have an ankle brachial index (ABI). During this test an ultrasound and blood pressure cuff are used to evaluate the arteries that supply the arms and legs with blood. Allow thirty minutes for this exam. There are no restrictions or special instructions.    Follow-Up: in 6 months with Dr. Ellyn Hack   Any Other Special Instructions Will Be Listed Below (If Applicable).     If you need a refill on your cardiac medications before your next appointment, please call your pharmacy.     Studies Ordered:   No orders of the defined types were placed in this encounter.     Glenetta Hew, M.D., M.S. Interventional Cardiologist   Pager # [phone] Phone # [phone] [address] [place], [place] [address]

## 2017-02-28 ENCOUNTER — Ambulatory Visit: Payer: Self-pay | Admitting: Nurse Practitioner

## 2017-03-01 ENCOUNTER — Encounter: Payer: Self-pay | Admitting: Nurse Practitioner

## 2017-03-20 ENCOUNTER — Inpatient Hospital Stay (HOSPITAL_COMMUNITY): Admission: RE | Admit: 2017-03-20 | Payer: Medicare Other | Source: Ambulatory Visit

## 2017-03-20 ENCOUNTER — Encounter (HOSPITAL_COMMUNITY): Payer: Medicare Other

## 2017-03-21 ENCOUNTER — Other Ambulatory Visit (HOSPITAL_COMMUNITY): Payer: Self-pay | Admitting: *Deleted

## 2017-03-22 ENCOUNTER — Encounter (HOSPITAL_COMMUNITY): Payer: Self-pay | Admitting: Cardiology

## 2017-03-22 ENCOUNTER — Ambulatory Visit (HOSPITAL_COMMUNITY)
Admission: RE | Admit: 2017-03-22 | Discharge: 2017-03-22 | Disposition: A | Discharge status: Home / Self Care | Payer: Medicare Other | Source: Ambulatory Visit | Attending: Nephrology | Admitting: Nephrology

## 2017-03-22 DIAGNOSIS — D631 Anemia in chronic kidney disease: Secondary | ICD-10-CM | POA: Insufficient documentation

## 2017-03-22 DIAGNOSIS — N184 Chronic kidney disease, stage 4 (severe): Secondary | ICD-10-CM | POA: Diagnosis not present

## 2017-03-22 LAB — IRON AND TIBC
IRON: 38 ug/dL (ref 28–170)
Saturation Ratios: 20 % (ref 10.4–31.8)
TIBC: 190 ug/dL — ABNORMAL LOW (ref 250–450)
UIBC: 152 ug/dL

## 2017-03-22 LAB — RENAL FUNCTION PANEL
ANION GAP: 11 (ref 5–15)
Albumin: 2.6 g/dL — ABNORMAL LOW (ref 3.5–5.0)
BUN: 94 mg/dL — ABNORMAL HIGH (ref 6–20)
CHLORIDE: 101 mmol/L (ref 101–111)
CO2: 22 mmol/L (ref 22–32)
Calcium: 9.2 mg/dL (ref 8.9–10.3)
Creatinine, Ser: 3.02 mg/dL — ABNORMAL HIGH (ref 0.44–1.00)
GFR, EST AFRICAN AMERICAN: 18 mL/min — AB (ref >60)
GFR, EST NON AFRICAN AMERICAN: 15 mL/min — AB (ref >60)
Glucose, Bld: 383 mg/dL — ABNORMAL HIGH (ref 65–99)
PHOSPHORUS: 4.2 mg/dL (ref 2.5–4.6)
POTASSIUM: 4.2 mmol/L (ref 3.5–5.1)
Sodium: 134 mmol/L — ABNORMAL LOW (ref 135–145)

## 2017-03-22 LAB — FERRITIN: FERRITIN: 1465 ng/mL — AB (ref 11–307)

## 2017-03-22 LAB — POCT HEMOGLOBIN-HEMACUE: Hemoglobin: 7.5 g/dL — ABNORMAL LOW (ref 12.0–15.0)

## 2017-03-22 MED ORDER — EPOETIN ALFA 20000 UNIT/ML IJ SOLN
20000.0000 [IU] | INTRAMUSCULAR | Status: DC
Start: 2017-03-22 — End: 2017-03-23
  Administered 2017-03-22: 20000 [IU] via SUBCUTANEOUS

## 2017-03-22 MED ORDER — EPOETIN ALFA 20000 UNIT/ML IJ SOLN
INTRAMUSCULAR | Status: AC
  Filled 2017-03-22: qty 1

## 2017-03-23 LAB — PTH, INTACT AND CALCIUM
Calcium, Total (PTH): 8.9 mg/dL (ref 8.7–10.3)
PTH: 122 pg/mL — AB (ref 15–65)

## 2017-03-29 ENCOUNTER — Encounter (HOSPITAL_COMMUNITY)
Admission: RE | Admit: 2017-03-29 | Discharge: 2017-03-29 | Disposition: A | Discharge status: Home / Self Care | Payer: Medicare Other | Source: Ambulatory Visit | Attending: Nephrology | Admitting: Nephrology

## 2017-03-29 DIAGNOSIS — N184 Chronic kidney disease, stage 4 (severe): Secondary | ICD-10-CM | POA: Diagnosis not present

## 2017-03-29 LAB — POCT HEMOGLOBIN-HEMACUE: HEMOGLOBIN: 7.9 g/dL — AB (ref 12.0–15.0)

## 2017-03-29 MED ORDER — EPOETIN ALFA 20000 UNIT/ML IJ SOLN
INTRAMUSCULAR | Status: AC
  Filled 2017-03-29: qty 1

## 2017-03-29 MED ORDER — EPOETIN ALFA 20000 UNIT/ML IJ SOLN
20000.0000 [IU] | INTRAMUSCULAR | Status: DC
  Administered 2017-03-29: 20000 [IU] via SUBCUTANEOUS

## 2017-04-04 ENCOUNTER — Other Ambulatory Visit: Payer: Self-pay | Admitting: Internal Medicine

## 2017-04-05 ENCOUNTER — Encounter (HOSPITAL_COMMUNITY)
Admission: RE | Admit: 2017-04-05 | Discharge: 2017-04-05 | Disposition: A | Discharge status: Home / Self Care | Payer: Medicare Other | Source: Ambulatory Visit | Attending: Nephrology | Admitting: Nephrology

## 2017-04-05 DIAGNOSIS — N184 Chronic kidney disease, stage 4 (severe): Secondary | ICD-10-CM

## 2017-04-05 LAB — POCT HEMOGLOBIN-HEMACUE: Hemoglobin: 7.6 g/dL — ABNORMAL LOW (ref 12.0–15.0)

## 2017-04-05 MED ORDER — EPOETIN ALFA 20000 UNIT/ML IJ SOLN
INTRAMUSCULAR | Status: AC
  Administered 2017-04-05: 11:00:00 20000 [IU] via SUBCUTANEOUS
  Filled 2017-04-05: qty 1

## 2017-04-05 MED ORDER — EPOETIN ALFA 20000 UNIT/ML IJ SOLN
20000.0000 [IU] | INTRAMUSCULAR | Status: DC
  Administered 2017-04-05: 20000 [IU] via SUBCUTANEOUS

## 2017-04-11 ENCOUNTER — Telehealth: Payer: Self-pay

## 2017-04-11 ENCOUNTER — Ambulatory Visit: Payer: Self-pay | Admitting: Nurse Practitioner

## 2017-04-11 NOTE — Telephone Encounter (Signed)
NO SHOW FOR APPT TODAY FOR STROKE HOSPITAL FOLLOW UP.

## 2017-04-12 ENCOUNTER — Encounter (HOSPITAL_COMMUNITY): Payer: Medicare Other

## 2017-04-16 ENCOUNTER — Encounter: Payer: Self-pay | Admitting: Nurse Practitioner

## 2017-04-24 ENCOUNTER — Inpatient Hospital Stay (HOSPITAL_COMMUNITY): Admission: RE | Admit: 2017-04-24 | Payer: Medicare Other | Source: Ambulatory Visit

## 2017-05-07 ENCOUNTER — Encounter (HOSPITAL_COMMUNITY): Payer: Self-pay | Admitting: Cardiology

## 2017-05-23 ENCOUNTER — Inpatient Hospital Stay (HOSPITAL_COMMUNITY)
Admission: RE | Admit: 2017-05-23 | Discharge: 2017-05-23 | Disposition: A | Discharge status: Home / Self Care | Payer: Medicare Other | Source: Ambulatory Visit | Attending: Nephrology | Admitting: Nephrology

## 2017-05-23 ENCOUNTER — Encounter (HOSPITAL_COMMUNITY): Payer: Self-pay

## 2017-06-05 ENCOUNTER — Other Ambulatory Visit: Payer: Self-pay | Admitting: Internal Medicine

## 2017-06-06 ENCOUNTER — Ambulatory Visit (HOSPITAL_COMMUNITY)
Admission: RE | Admit: 2017-06-06 | Discharge: 2017-06-06 | Disposition: A | Discharge status: Home / Self Care | Payer: Medicare Other | Source: Ambulatory Visit | Attending: Nephrology | Admitting: Nephrology

## 2017-06-06 DIAGNOSIS — N184 Chronic kidney disease, stage 4 (severe): Secondary | ICD-10-CM | POA: Diagnosis present

## 2017-06-06 LAB — IRON AND TIBC
Iron: 42 ug/dL (ref 28–170)
Saturation Ratios: 17 % (ref 10.4–31.8)
TIBC: 241 ug/dL — AB (ref 250–450)
UIBC: 199 ug/dL

## 2017-06-06 LAB — RENAL FUNCTION PANEL
ALBUMIN: 2.9 g/dL — AB (ref 3.5–5.0)
Anion gap: 10 (ref 5–15)
BUN: 85 mg/dL — ABNORMAL HIGH (ref 6–20)
CALCIUM: 9.7 mg/dL (ref 8.9–10.3)
CO2: 24 mmol/L (ref 22–32)
Chloride: 106 mmol/L (ref 101–111)
Creatinine, Ser: 2.79 mg/dL — ABNORMAL HIGH (ref 0.44–1.00)
GFR calc non Af Amer: 17 mL/min — ABNORMAL LOW (ref >60)
GFR, EST AFRICAN AMERICAN: 19 mL/min — AB (ref >60)
GLUCOSE: 154 mg/dL — AB (ref 65–99)
PHOSPHORUS: 4 mg/dL (ref 2.5–4.6)
Potassium: 4.3 mmol/L (ref 3.5–5.1)
SODIUM: 140 mmol/L (ref 135–145)

## 2017-06-06 LAB — FERRITIN: Ferritin: 828 ng/mL — ABNORMAL HIGH (ref 11–307)

## 2017-06-06 LAB — POCT HEMOGLOBIN-HEMACUE: HEMOGLOBIN: 7.8 g/dL — AB (ref 12.0–15.0)

## 2017-06-06 MED ORDER — EPOETIN ALFA 20000 UNIT/ML IJ SOLN
20000.0000 [IU] | INTRAMUSCULAR | Status: DC
  Administered 2017-06-06: 20000 [IU] via SUBCUTANEOUS

## 2017-06-06 MED ORDER — EPOETIN ALFA 20000 UNIT/ML IJ SOLN
INTRAMUSCULAR | Status: AC
  Filled 2017-06-06: qty 1

## 2017-06-06 NOTE — Progress Notes (Signed)
hemocue today 7.8 denies chest pain, SOB, or signs of bleeding.  Called [facility] and left a message with Erline Levine stating the above, as well as letting her know we hve not seen the patient since 04/05/2017 and at that time she was 7.6.  Gave patient her injection, encouraged her to keep her appts here at short stay and asked Erline Levine to call if any further orders needed.

## 2017-06-07 LAB — PTH, INTACT AND CALCIUM
Calcium, Total (PTH): 9.6 mg/dL (ref 8.7–10.3)
PTH: 186 pg/mL — ABNORMAL HIGH (ref 15–65)

## 2017-06-13 ENCOUNTER — Encounter (HOSPITAL_COMMUNITY)
Admission: RE | Admit: 2017-06-13 | Discharge: 2017-06-13 | Disposition: A | Discharge status: Home / Self Care | Payer: Medicare Other | Source: Ambulatory Visit | Attending: Nephrology | Admitting: Nephrology

## 2017-06-13 DIAGNOSIS — N184 Chronic kidney disease, stage 4 (severe): Secondary | ICD-10-CM | POA: Diagnosis present

## 2017-06-13 LAB — POCT HEMOGLOBIN-HEMACUE: Hemoglobin: 8.4 g/dL — ABNORMAL LOW (ref 12.0–15.0)

## 2017-06-13 MED ORDER — EPOETIN ALFA 20000 UNIT/ML IJ SOLN
INTRAMUSCULAR | Status: AC
  Filled 2017-06-13: qty 1

## 2017-06-13 MED ORDER — EPOETIN ALFA 20000 UNIT/ML IJ SOLN
20000.0000 [IU] | INTRAMUSCULAR | Status: DC
  Administered 2017-06-13: 11:00:00 20000 [IU] via SUBCUTANEOUS

## 2017-06-20 ENCOUNTER — Encounter (HOSPITAL_COMMUNITY): Payer: Medicare Other

## 2017-06-20 ENCOUNTER — Ambulatory Visit (HOSPITAL_COMMUNITY)
Admission: RE | Admit: 2017-06-20 | Payer: Medicare Other | Source: Ambulatory Visit | Attending: Cardiology | Admitting: Cardiology

## 2017-06-20 ENCOUNTER — Other Ambulatory Visit: Payer: Self-pay | Admitting: Cardiology

## 2017-06-20 DIAGNOSIS — I714 Abdominal aortic aneurysm, without rupture, unspecified: Secondary | ICD-10-CM

## 2017-06-20 NOTE — Progress Notes (Signed)
Was aspirated indication checking an abdominal aortic Sound. Based on her having PAD, I felt that her age that she warranted screening evaluation. [name] Hew, MD

## 2017-07-02 ENCOUNTER — Encounter (HOSPITAL_COMMUNITY): Payer: Self-pay | Admitting: Emergency Medicine

## 2017-07-02 ENCOUNTER — Ambulatory Visit (HOSPITAL_COMMUNITY)
Admission: EM | Admit: 2017-07-02 | Discharge: 2017-07-02 | Disposition: A | Discharge status: Home / Self Care | Payer: Medicare Other | Attending: Family Medicine | Admitting: Family Medicine

## 2017-07-02 DIAGNOSIS — S46911A Strain of unspecified muscle, fascia and tendon at shoulder and upper arm level, right arm, initial encounter: Secondary | ICD-10-CM

## 2017-07-02 MED ORDER — METAXALONE 800 MG PO TABS: 400.0000 mg | ORAL_TABLET | Freq: Three times a day (TID) | ORAL | 0 refills | Status: DC

## 2017-07-02 NOTE — ED Provider Notes (Signed)
  [place]   [id number] 07/02/17 Arrival Time: 2423  ASSESSMENT & PLAN:  1. Strain of right shoulder, initial encounter     Meds ordered this encounter  Medications  . DISCONTD: metaxalone (SKELAXIN) 800 MG tablet    Sig: Take 0.5-1 tablets (400-800 mg total) by mouth 3 (three) times daily.    Dispense:  10 tablet    Refill:  0    Order Specific Question:   Supervising Provider    Answer:   Sherlene Shams [[id number]]  . metaxalone (SKELAXIN) 800 MG tablet    Sig: Take 0.5-1 tablets (400-800 mg total) by mouth 3 (three) times daily.    Dispense:  10 tablet    Refill:  0    Order Specific Question:   Supervising Provider    Answer:   Sherlene Shams [[id number]]    Reviewed expectations re: course of current medical issues. Questions answered. Outlined signs and symptoms indicating need for more acute intervention. Patient verbalized understanding. After Visit Summary given.   SUBJECTIVE:  [name] is a 66 y.o. female who presents with complaint of right shoulder discomfort.  She has been having pain with abduction.  ROS: As per HPI.   OBJECTIVE:  Vitals:   07/02/17 1551  BP: (!) 196/71  Pulse: 77  Temp: 98.4 F (36.9 C)  TempSrc: Oral  SpO2: 99%     General appearance: alert; no distress HEENT: normocephalic; atraumatic; conjunctivae normal; TMs normal; nasal mucosa normal; oral mucosa normal Neck: supple Lungs: clear to auscultation bilaterally Heart: regular rate and rhythm MS - Tenderness right shoulder with abduction, internal/external rotation. Neurologic: normal symmetric reflexes; normal gait Psychological:  alert and cooperative; normal mood and affect    Labs Reviewed - No data to display  No results found.  No Known Allergies  PMHx, SurgHx, SocialHx, Medications, and Allergies were reviewed in the Visit Navigator and updated as appropriate.      Lysbeth Penner, [place] 07/02/17 [phone]

## 2017-07-02 NOTE — ED Triage Notes (Addendum)
Pt states she has been suffering from right shoulder pain for three days.  She denies any injury to the shoulder.  She states she took some "pain pills" her daughter gave her but they did not help.  She reports being told she may have arthritis in the arm/shoulder and was prescribed a muscle relaxer but she no longer has the medication.

## 2017-09-04 ENCOUNTER — Encounter (HOSPITAL_COMMUNITY): Payer: Self-pay | Admitting: Cardiology

## 2017-12-15 IMAGING — MR MR HEAD W/O CM
9 of 11 series · 30 of 48 positions shown · non-contrast
Comparison: 12/19/2016 CT head

CLINICAL DATA: 65 y/o F; right-sided facial droop and slurred
speech.

EXAM:
MRI HEAD WITHOUT CONTRAST
MRA HEAD WITHOUT CONTRAST
TECHNIQUE: Multiplanar, multiecho pulse sequences of the brain and surrounding
structures were obtained without intravenous contrast. Angiographic
images of the head were obtained using MRA technique without
contrast.

[Series 3: DWI · axial · 3.0mm · 1.09mm/px · z∈[-65,+67]mm · 7 of 90 slices shown (1 of 4)]
[im 1/90]
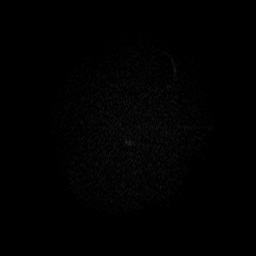
[im 15/90]
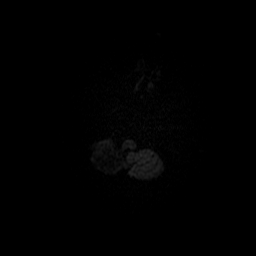
[im 30/90]
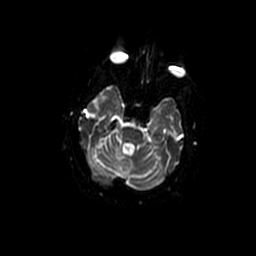
[im 45/90]
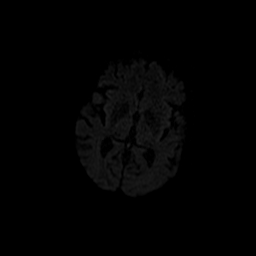
[im 60/90]
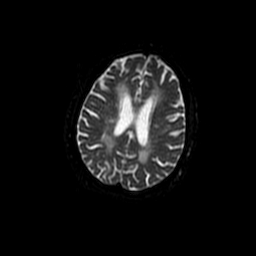
[im 75/90]
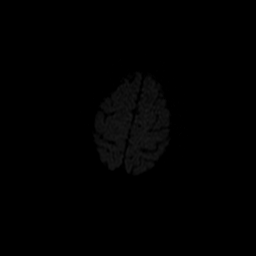
[im 90/90]
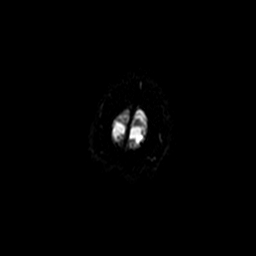

[Series 4: (id) mt fs · axial · 1.4mm · 0.43mm/px · z∈[-78,-31]mm · 4 of 152 slices shown]
[im 1/152]
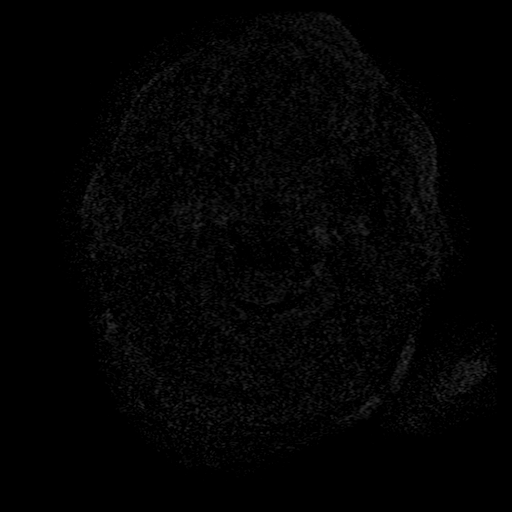
[im 28/152]
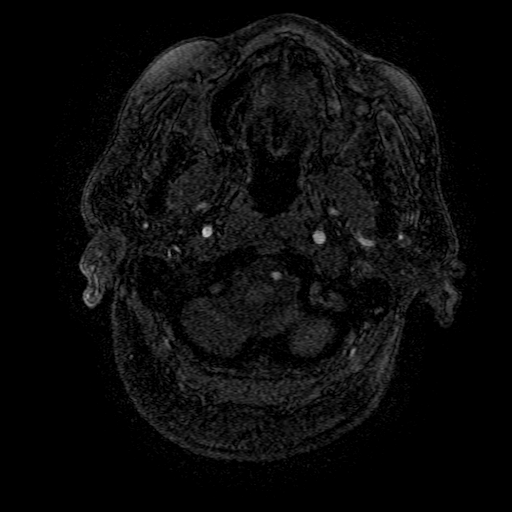
[im 42/152]
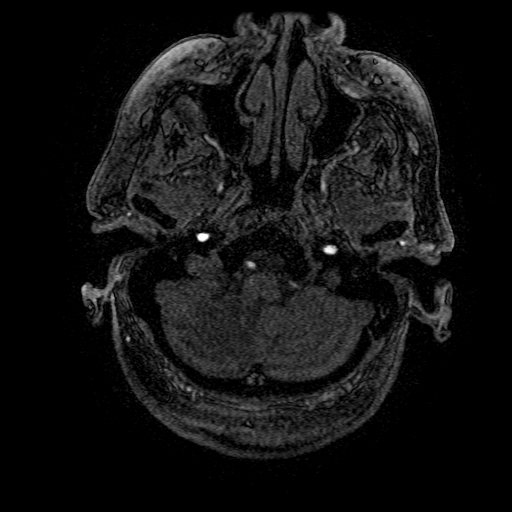
[im 69/152]
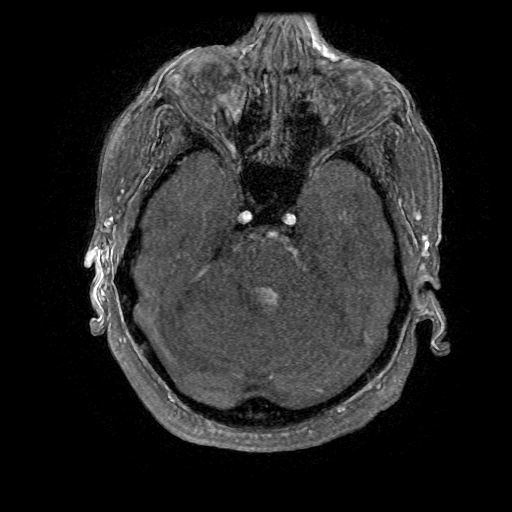

[Series 5: T1 · sagittal · 5.0mm · 0.47mm/px · 2 of 23 slices shown]
[im 1/23]
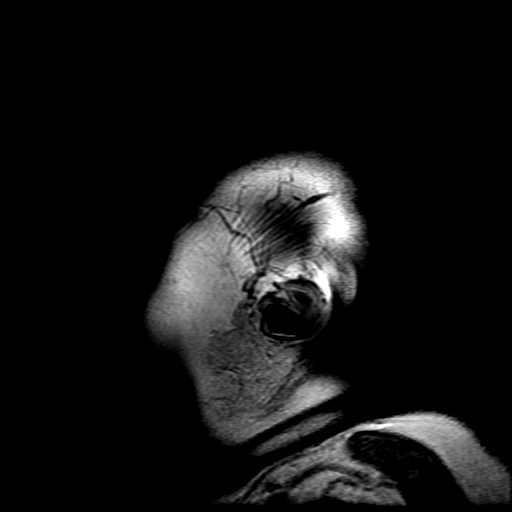
[im 23/23]
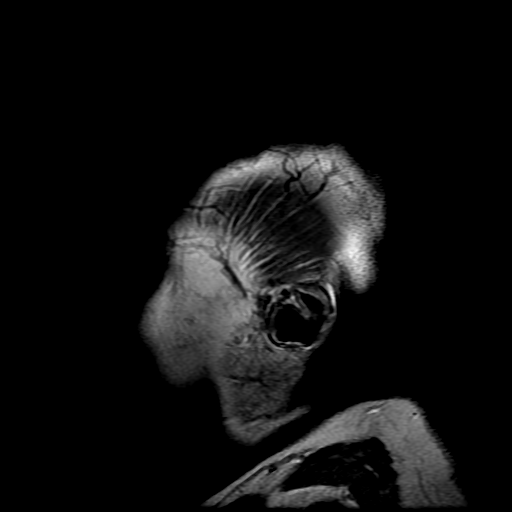

[Series 7: T2 · axial · 5.0mm · 0.43mm/px · z∈[-64,+67]mm · 2 of 23 slices shown (1 of 2)]
[im 1/23]
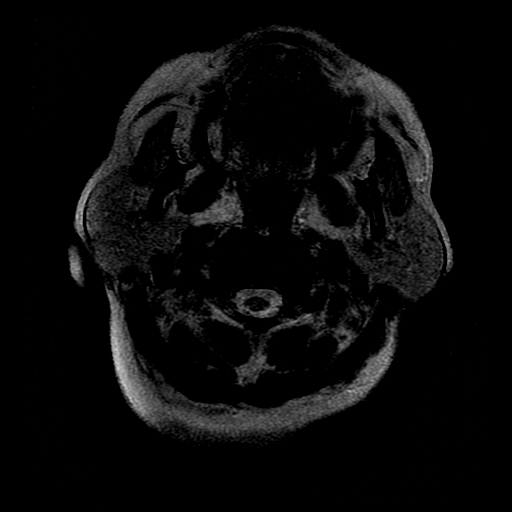
[im 23/23]
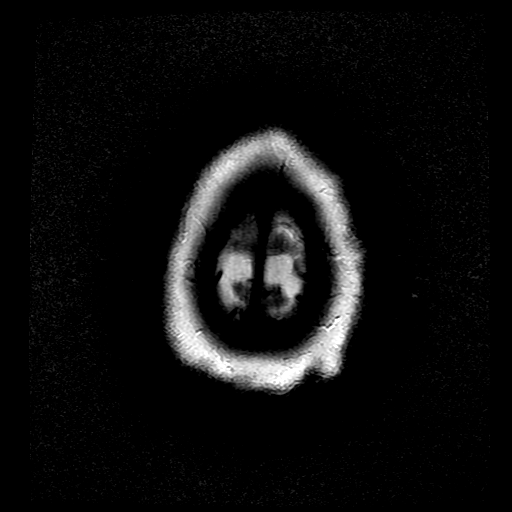

[Series 8: FLAIR · axial · 5.0mm · 0.43mm/px · z∈[-64,+67]mm · 2 of 23 slices shown]
[im 1/23]
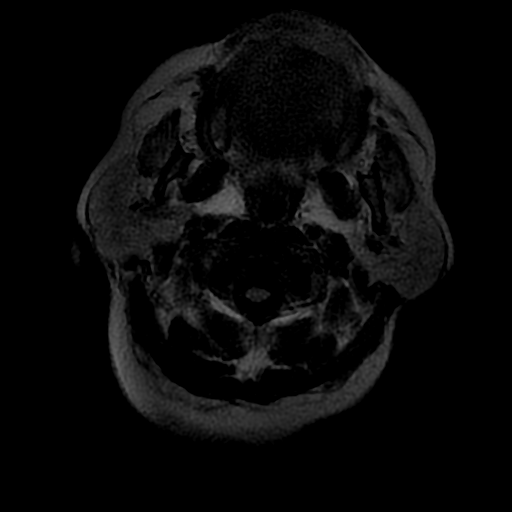
[im 23/23]
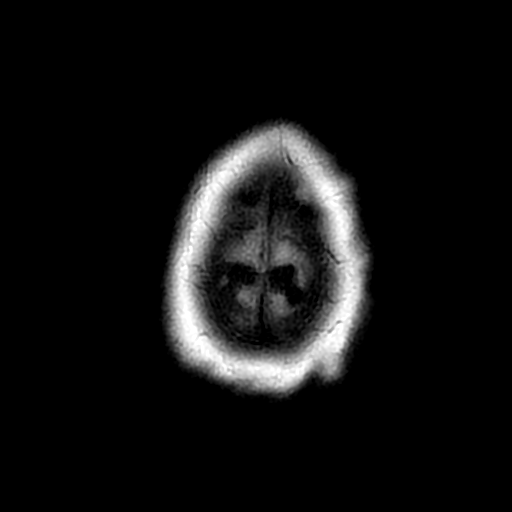

[Series 9: DWI · coronal · 5.0mm · 1.09mm/px · 5 of 60 slices shown (2 of 4)]
[im 1/60]
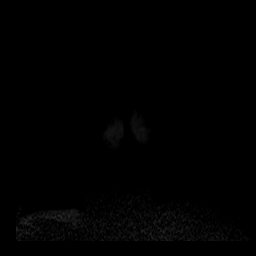
[im 15/60]
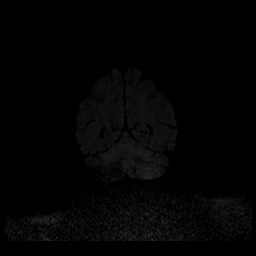
[im 30/60]
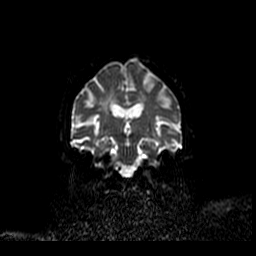
[im 45/60]
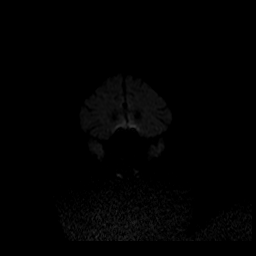
[im 60/60]
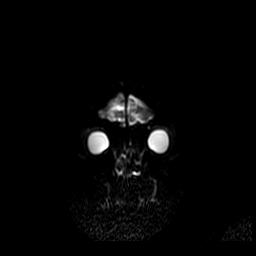

[Series 13: T2 · coronal · 5.0mm · 0.39mm/px · 2 of 23 slices shown (2 of 2)]
[im 1/23]
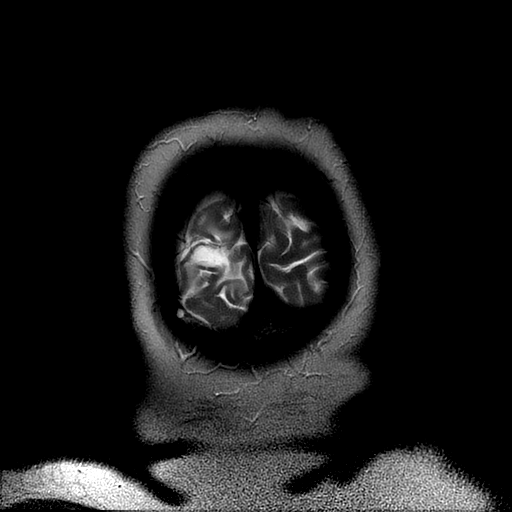
[im 23/23]
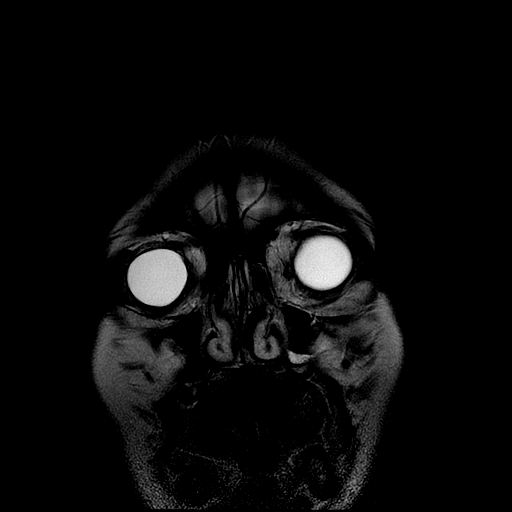

[Series 300: DWI · axial · 3.0mm · 1.09mm/px · z∈[-65,+67]mm · 4 of 45 slices shown (3 of 4)]
[im 1/45]
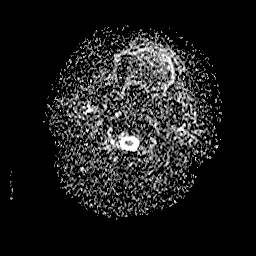
[im 15/45]
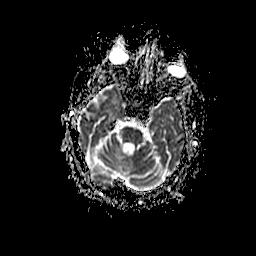
[im 30/45]
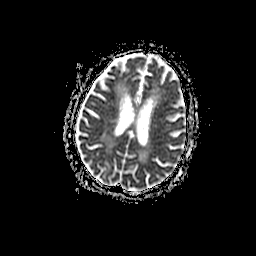
[im 45/45]
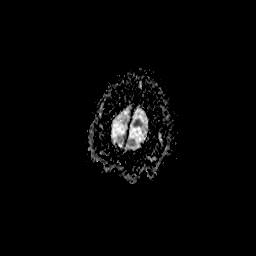

[Series 900: DWI · coronal · 5.0mm · 1.09mm/px · 2 of 30 slices shown (4 of 4)]
[im 1/30]
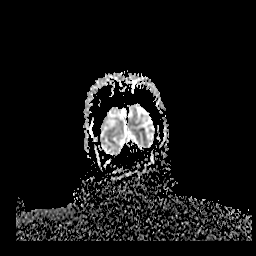
[im 30/30]
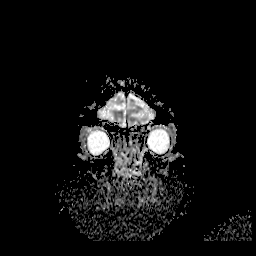

[30 of 48 positions shown; findings below may reference images not displayed]

FINDINGS: MRI HEAD FINDINGS

Brain: Small acute infarct within the left posterior putamen and
extending into posterior corona radiata. No associated hemorrhage
identified in the area of infarction.

Right cerebellar and right occipital encephalomalacia compatible
with chronic infarction. T2 FLAIR hyperintense signal abnormality in
subcortical and periventricular white matter is nonspecific but
compatible with advanced chronic microvascular ischemic changes.
Moderate brain parenchymal volume loss for age. Small foci of
susceptibility hypointensity within left medial temporal lobe, left
lentiform nucleus inferiorly, and in the right cerebellar hemisphere
compatible with hemosiderin deposition from old microhemorrhage.

Vascular: Normal flow voids.

Skull and upper cervical spine: Normal marrow signal.

Sinuses/Orbits: Left maxillary sinus mucosal thickening. Otherwise
no abnormal signal of the paranasal sinuses or mastoid air cells.
Bilateral intra-ocular lens replacement.

Other: None.

MRA HEAD FINDINGS

Moderate motion degradation. Suboptimal assessment for subtle
stenosis or small aneurysm. No large vessel occlusion, high-grade
stenosis, or aneurysm identified in the anterior or posterior
circulation. Stable diminutive flow related signal within the right
vertebral artery. Patent anterior left posterior communicating
artery and small left P1 segment compatible with fetal left PCA. No
right posterior communicating artery identified, likely hypoplastic
or absent.
IMPRESSION: 1. Small acute infarct within the left posterior putamen extending
into posterior corona radiata. No associated hemorrhage identified.
2. Chronic right cerebellar and occipital infarcts.
3. Advanced chronic microvascular ischemic changes and moderate
brain parenchymal volume loss is stable.
4. Moderate motion degradation of time-of-flight MRA, suboptimal
assessment for subtle stenosis or small aneurysm. No large vessel
occlusion, high-grade stenosis, or aneurysm identified in the
anterior or posterior circulation.
These results will be called to the ordering clinician or
representative by the Radiologist Assistant, and communication
documented in the PACS or zVision Dashboard.

By: Soetaryo Jiguja M.D.

## 2018-01-30 ENCOUNTER — Other Ambulatory Visit: Payer: Self-pay | Admitting: Family Medicine

## 2018-01-30 DIAGNOSIS — Z1231 Encounter for screening mammogram for malignant neoplasm of breast: Secondary | ICD-10-CM

## 2018-05-13 ENCOUNTER — Other Ambulatory Visit: Payer: Self-pay | Admitting: Family Medicine

## 2018-05-13 DIAGNOSIS — R5381 Other malaise: Secondary | ICD-10-CM

## 2018-05-15 ENCOUNTER — Other Ambulatory Visit: Payer: Self-pay | Admitting: Family Medicine

## 2018-05-15 DIAGNOSIS — M8580 Other specified disorders of bone density and structure, unspecified site: Secondary | ICD-10-CM

## 2018-05-15 DIAGNOSIS — Z1231 Encounter for screening mammogram for malignant neoplasm of breast: Secondary | ICD-10-CM

## 2018-07-02 ENCOUNTER — Other Ambulatory Visit: Payer: Medicare Other

## 2018-07-02 ENCOUNTER — Inpatient Hospital Stay: Admission: RE | Admit: 2018-07-02 | Payer: Medicare Other | Source: Ambulatory Visit

## 2019-08-09 ENCOUNTER — Inpatient Hospital Stay (HOSPITAL_COMMUNITY)
Admission: EM | Admit: 2019-08-09 | Discharge: 2019-08-13 | DRG: 674 | Disposition: A | Discharge status: Home / Self Care | Payer: Medicare Other | Attending: Internal Medicine | Admitting: Internal Medicine

## 2019-08-09 ENCOUNTER — Emergency Department (HOSPITAL_COMMUNITY): Payer: Medicare Other

## 2019-08-09 ENCOUNTER — Observation Stay (HOSPITAL_COMMUNITY): Payer: Medicare Other

## 2019-08-09 ENCOUNTER — Other Ambulatory Visit: Payer: Self-pay

## 2019-08-09 DIAGNOSIS — Z7902 Long term (current) use of antithrombotics/antiplatelets: Secondary | ICD-10-CM

## 2019-08-09 DIAGNOSIS — Z6841 Body Mass Index (BMI) 40.0 and over, adult: Secondary | ICD-10-CM

## 2019-08-09 DIAGNOSIS — D631 Anemia in chronic kidney disease: Secondary | ICD-10-CM | POA: Diagnosis present

## 2019-08-09 DIAGNOSIS — M545 Low back pain: Secondary | ICD-10-CM | POA: Diagnosis present

## 2019-08-09 DIAGNOSIS — Z8249 Family history of ischemic heart disease and other diseases of the circulatory system: Secondary | ICD-10-CM

## 2019-08-09 DIAGNOSIS — E162 Hypoglycemia, unspecified: Secondary | ICD-10-CM

## 2019-08-09 DIAGNOSIS — I252 Old myocardial infarction: Secondary | ICD-10-CM

## 2019-08-09 DIAGNOSIS — I639 Cerebral infarction, unspecified: Secondary | ICD-10-CM | POA: Diagnosis present

## 2019-08-09 DIAGNOSIS — Z794 Long term (current) use of insulin: Secondary | ICD-10-CM

## 2019-08-09 DIAGNOSIS — E1151 Type 2 diabetes mellitus with diabetic peripheral angiopathy without gangrene: Secondary | ICD-10-CM | POA: Diagnosis present

## 2019-08-09 DIAGNOSIS — Z79899 Other long term (current) drug therapy: Secondary | ICD-10-CM

## 2019-08-09 DIAGNOSIS — E1129 Type 2 diabetes mellitus with other diabetic kidney complication: Secondary | ICD-10-CM | POA: Diagnosis present

## 2019-08-09 DIAGNOSIS — E1142 Type 2 diabetes mellitus with diabetic polyneuropathy: Secondary | ICD-10-CM | POA: Diagnosis present

## 2019-08-09 DIAGNOSIS — Z7901 Long term (current) use of anticoagulants: Secondary | ICD-10-CM

## 2019-08-09 DIAGNOSIS — Z8541 Personal history of malignant neoplasm of cervix uteri: Secondary | ICD-10-CM

## 2019-08-09 DIAGNOSIS — I10 Essential (primary) hypertension: Secondary | ICD-10-CM | POA: Diagnosis present

## 2019-08-09 DIAGNOSIS — F329 Major depressive disorder, single episode, unspecified: Secondary | ICD-10-CM | POA: Diagnosis present

## 2019-08-09 DIAGNOSIS — E785 Hyperlipidemia, unspecified: Secondary | ICD-10-CM | POA: Diagnosis present

## 2019-08-09 DIAGNOSIS — N185 Chronic kidney disease, stage 5: Secondary | ICD-10-CM | POA: Diagnosis present

## 2019-08-09 DIAGNOSIS — Z833 Family history of diabetes mellitus: Secondary | ICD-10-CM

## 2019-08-09 DIAGNOSIS — E11649 Type 2 diabetes mellitus with hypoglycemia without coma: Secondary | ICD-10-CM | POA: Diagnosis present

## 2019-08-09 DIAGNOSIS — Z9861 Coronary angioplasty status: Secondary | ICD-10-CM

## 2019-08-09 DIAGNOSIS — I5032 Chronic diastolic (congestive) heart failure: Secondary | ICD-10-CM | POA: Diagnosis present

## 2019-08-09 DIAGNOSIS — Z809 Family history of malignant neoplasm, unspecified: Secondary | ICD-10-CM

## 2019-08-09 DIAGNOSIS — E872 Acidosis, unspecified: Secondary | ICD-10-CM

## 2019-08-09 DIAGNOSIS — M1990 Unspecified osteoarthritis, unspecified site: Secondary | ICD-10-CM | POA: Diagnosis present

## 2019-08-09 DIAGNOSIS — E875 Hyperkalemia: Secondary | ICD-10-CM

## 2019-08-09 DIAGNOSIS — Z8673 Personal history of transient ischemic attack (TIA), and cerebral infarction without residual deficits: Secondary | ICD-10-CM

## 2019-08-09 DIAGNOSIS — N179 Acute kidney failure, unspecified: Secondary | ICD-10-CM | POA: Diagnosis present

## 2019-08-09 DIAGNOSIS — D649 Anemia, unspecified: Secondary | ICD-10-CM | POA: Diagnosis present

## 2019-08-09 DIAGNOSIS — D638 Anemia in other chronic diseases classified elsewhere: Secondary | ICD-10-CM | POA: Diagnosis present

## 2019-08-09 DIAGNOSIS — E1169 Type 2 diabetes mellitus with other specified complication: Secondary | ICD-10-CM | POA: Diagnosis present

## 2019-08-09 DIAGNOSIS — Z9981 Dependence on supplemental oxygen: Secondary | ICD-10-CM

## 2019-08-09 DIAGNOSIS — Z90710 Acquired absence of both cervix and uterus: Secondary | ICD-10-CM

## 2019-08-09 DIAGNOSIS — G8929 Other chronic pain: Secondary | ICD-10-CM | POA: Diagnosis present

## 2019-08-09 DIAGNOSIS — Z811 Family history of alcohol abuse and dependence: Secondary | ICD-10-CM

## 2019-08-09 DIAGNOSIS — T68XXXA Hypothermia, initial encounter: Secondary | ICD-10-CM

## 2019-08-09 DIAGNOSIS — E1122 Type 2 diabetes mellitus with diabetic chronic kidney disease: Secondary | ICD-10-CM

## 2019-08-09 DIAGNOSIS — Z89512 Acquired absence of left leg below knee: Secondary | ICD-10-CM

## 2019-08-09 DIAGNOSIS — E1165 Type 2 diabetes mellitus with hyperglycemia: Secondary | ICD-10-CM | POA: Diagnosis present

## 2019-08-09 DIAGNOSIS — Z20828 Contact with and (suspected) exposure to other viral communicable diseases: Secondary | ICD-10-CM | POA: Diagnosis present

## 2019-08-09 DIAGNOSIS — Z87891 Personal history of nicotine dependence: Secondary | ICD-10-CM

## 2019-08-09 DIAGNOSIS — N184 Chronic kidney disease, stage 4 (severe): Secondary | ICD-10-CM

## 2019-08-09 DIAGNOSIS — I739 Peripheral vascular disease, unspecified: Secondary | ICD-10-CM | POA: Diagnosis present

## 2019-08-09 DIAGNOSIS — Z6838 Body mass index (BMI) 38.0-38.9, adult: Secondary | ICD-10-CM

## 2019-08-09 DIAGNOSIS — I129 Hypertensive chronic kidney disease with stage 1 through stage 4 chronic kidney disease, or unspecified chronic kidney disease: Secondary | ICD-10-CM | POA: Diagnosis present

## 2019-08-09 LAB — GLUCOSE, CAPILLARY
Glucose-Capillary: 110 mg/dL — ABNORMAL HIGH (ref 70–99)
Glucose-Capillary: 140 mg/dL — ABNORMAL HIGH (ref 70–99)

## 2019-08-09 LAB — CBC WITH DIFFERENTIAL/PLATELET
Abs Immature Granulocytes: 0.02 10*3/uL (ref 0.00–0.07)
Basophils Absolute: 0 10*3/uL (ref 0.0–0.1)
Basophils Relative: 0 %
Eosinophils Absolute: 0 10*3/uL (ref 0.0–0.5)
Eosinophils Relative: 1 %
HCT: 29.2 % — ABNORMAL LOW (ref 36.0–46.0)
Hemoglobin: 9.7 g/dL — ABNORMAL LOW (ref 12.0–15.0)
Immature Granulocytes: 0 %
Lymphocytes Relative: 16 %
Lymphs Abs: 0.8 10*3/uL (ref 0.7–4.0)
MCH: 33 pg (ref 26.0–34.0)
MCHC: 33.2 g/dL (ref 30.0–36.0)
MCV: 99.3 fL (ref 80.0–100.0)
Monocytes Absolute: 0.5 10*3/uL (ref 0.1–1.0)
Monocytes Relative: 9 %
Neutro Abs: 3.8 10*3/uL (ref 1.7–7.7)
Neutrophils Relative %: 74 %
Platelets: 157 10*3/uL (ref 150–400)
RBC: 2.94 MIL/uL — ABNORMAL LOW (ref 3.87–5.11)
RDW: 14.2 % (ref 11.5–15.5)
WBC: 5.1 10*3/uL (ref 4.0–10.5)
nRBC: 0 % (ref 0.0–0.2)

## 2019-08-09 LAB — LACTIC ACID, PLASMA
Lactic Acid, Venous: 3 mmol/L (ref 0.5–1.9)
Lactic Acid, Venous: 2.5 mmol/L (ref 0.5–1.9)

## 2019-08-09 LAB — COMPREHENSIVE METABOLIC PANEL
ALT: 10 U/L (ref 0–44)
AST: 35 U/L (ref 15–41)
Albumin: 3.1 g/dL — ABNORMAL LOW (ref 3.5–5.0)
Alkaline Phosphatase: 118 U/L (ref 38–126)
Anion gap: 10 (ref 5–15)
BUN: 84 mg/dL — ABNORMAL HIGH (ref 8–23)
CO2: 22 mmol/L (ref 22–32)
Calcium: 9.3 mg/dL (ref 8.9–10.3)
Chloride: 107 mmol/L (ref 98–111)
Creatinine, Ser: 4.09 mg/dL — ABNORMAL HIGH (ref 0.44–1.00)
GFR calc Af Amer: 12 mL/min — ABNORMAL LOW (ref >60)
GFR calc non Af Amer: 11 mL/min — ABNORMAL LOW (ref >60)
Glucose, Bld: 207 mg/dL — ABNORMAL HIGH (ref 70–99)
Potassium: 5.4 mmol/L — ABNORMAL HIGH (ref 3.5–5.1)
Sodium: 139 mmol/L (ref 135–145)
Total Bilirubin: 0.9 mg/dL (ref 0.3–1.2)
Total Protein: 6.7 g/dL (ref 6.5–8.1)

## 2019-08-09 LAB — URINALYSIS, ROUTINE W REFLEX MICROSCOPIC
Bilirubin Urine: NEGATIVE
Glucose, UA: NEGATIVE mg/dL
Hgb urine dipstick: NEGATIVE
Ketones, ur: NEGATIVE mg/dL
Nitrite: NEGATIVE
Protein, ur: 30 mg/dL — AB
Specific Gravity, Urine: 1.011 (ref 1.005–1.030)
pH: 8 (ref 5.0–8.0)

## 2019-08-09 LAB — HEMOGLOBIN A1C
Hgb A1c MFr Bld: 6.1 % — ABNORMAL HIGH (ref 4.8–5.6)
Mean Plasma Glucose: 128.37 mg/dL

## 2019-08-09 LAB — CBG MONITORING, ED
Glucose-Capillary: 160 mg/dL — ABNORMAL HIGH (ref 70–99)
Glucose-Capillary: 98 mg/dL (ref 70–99)

## 2019-08-09 LAB — PROTIME-INR
INR: 1.1 (ref 0.8–1.2)
Prothrombin Time: 13.7 seconds (ref 11.4–15.2)

## 2019-08-09 LAB — MAGNESIUM: Magnesium: 2.3 mg/dL (ref 1.7–2.4)

## 2019-08-09 LAB — APTT: aPTT: 20 seconds — ABNORMAL LOW (ref 24–36)

## 2019-08-09 LAB — PHOSPHORUS: Phosphorus: 5.1 mg/dL — ABNORMAL HIGH (ref 2.5–4.6)

## 2019-08-09 MED ORDER — FERROUS SULFATE 325 (65 FE) MG PO TABS
325.0000 mg | ORAL_TABLET | Freq: Every day | ORAL | Status: DC
  Administered 2019-08-10 – 2019-08-13 (×3): 325 mg via ORAL
  Filled 2019-08-09 (×3): qty 1

## 2019-08-09 MED ORDER — CARVEDILOL 25 MG PO TABS
25.0000 mg | ORAL_TABLET | Freq: Two times a day (BID) | ORAL | Status: DC
  Administered 2019-08-09 – 2019-08-13 (×7): 25 mg via ORAL
  Filled 2019-08-09: qty 2
  Filled 2019-08-09 (×6): qty 1

## 2019-08-09 MED ORDER — ENOXAPARIN SODIUM 30 MG/0.3ML ~~LOC~~ SOLN
30.0000 mg | SUBCUTANEOUS | Status: DC
  Administered 2019-08-10 – 2019-08-11 (×2): 30 mg via SUBCUTANEOUS
  Filled 2019-08-09 (×3): qty 0.3

## 2019-08-09 MED ORDER — SODIUM CHLORIDE 0.9 % IV SOLN
2.0000 g | Freq: Once | INTRAVENOUS | Status: AC
  Administered 2019-08-09: 14:00:00 2 g via INTRAVENOUS
  Filled 2019-08-09: qty 20

## 2019-08-09 MED ORDER — ATORVASTATIN CALCIUM 80 MG PO TABS
80.0000 mg | ORAL_TABLET | Freq: Every day | ORAL | Status: DC
  Administered 2019-08-10 – 2019-08-12 (×3): 80 mg via ORAL
  Filled 2019-08-09 (×3): qty 1

## 2019-08-09 MED ORDER — SODIUM ZIRCONIUM CYCLOSILICATE 10 G PO PACK
10.0000 g | PACK | Freq: Two times a day (BID) | ORAL | Status: DC
  Administered 2019-08-10 (×2): 10 g via ORAL
  Filled 2019-08-09 (×2): qty 1

## 2019-08-09 MED ORDER — CLOPIDOGREL BISULFATE 75 MG PO TABS
75.0000 mg | ORAL_TABLET | Freq: Every day | ORAL | Status: DC
  Administered 2019-08-10 – 2019-08-13 (×3): 75 mg via ORAL
  Filled 2019-08-09 (×3): qty 1

## 2019-08-09 MED ORDER — ACETAMINOPHEN 650 MG RE SUPP: 650.0000 mg | Freq: Four times a day (QID) | RECTAL | Status: DC | PRN

## 2019-08-09 MED ORDER — SODIUM CHLORIDE 0.45 % IV SOLN
INTRAVENOUS | Status: AC
  Administered 2019-08-10 (×2): via INTRAVENOUS

## 2019-08-09 MED ORDER — SODIUM CHLORIDE 0.9 % IV BOLUS
500.0000 mL | Freq: Once | INTRAVENOUS | Status: AC
  Administered 2019-08-09: 14:00:00 500 mL via INTRAVENOUS

## 2019-08-09 MED ORDER — ONDANSETRON HCL 4 MG PO TABS: 4.0000 mg | ORAL_TABLET | Freq: Four times a day (QID) | ORAL | Status: DC | PRN

## 2019-08-09 MED ORDER — ONDANSETRON HCL 4 MG/2ML IJ SOLN: 4.0000 mg | Freq: Four times a day (QID) | INTRAMUSCULAR | Status: DC | PRN

## 2019-08-09 MED ORDER — BUPROPION HCL ER (XL) 150 MG PO TB24
300.0000 mg | ORAL_TABLET | Freq: Every day | ORAL | Status: DC
  Administered 2019-08-10 – 2019-08-13 (×3): 300 mg via ORAL
  Filled 2019-08-09 (×3): qty 2

## 2019-08-09 MED ORDER — ACETAMINOPHEN 325 MG PO TABS: 650.0000 mg | ORAL_TABLET | Freq: Four times a day (QID) | ORAL | Status: DC | PRN

## 2019-08-09 NOTE — Consult Note (Addendum)
Renal Service Consult Note [facility]  [name] 08/09/2019 Sol [place] Requesting Physician:  Dr Doristine Bosworth, R.   Reason for Consult:  Renal failure HPI: [name] is a 68 y.o. year-old with hx of PAD sp L BKA, CKD IV, CVA (2014), DM2, anemia presented to ED brought by EMS for hypoglycemia episode.  Pt lives w/ her daughter at home and uses a WC.  No tobacco/ etoh use.  In ED pt was hypothermic and was therefore admitted. rec'd IV fluid bolus 500 cc, Rocephin x 1.  K 5.4, creat 4.0.    [name] is a poor historian for recent events. She grew up in [place], had 4 kids and lives w/ one of her daughters in [place].  Uses a WC to get around, has some memory issues. Denies any SOB, poor appetite for fatigue.  Said she vomited twice in [name] few days before coming to hospital. Couldn't get anymore detail on hx of N/V.  None now.  No leg swelling, no orthopnea, coug or SOB.  Takes lasix at home.     ROS  denies CP  no joint pain   no HA  no blurry vision  no rash  no dysuria  no difficulty voiding  no change in urine color    Past Medical History  Past Medical History:  Diagnosis Date  . Anemia   . Arthritis    "right shoulder" (12/19/2016)  . CAD S/P percutaneous coronary angioplasty 5/110    status post PCI to [name] RCA for inferior STEMI  . Cervical cancer ([place])   . Chronic diastolic heart failure, NYHA class 2 ([place])    Echocardiogram 06/2014:  Technically difficult study. Normal LV size with mild LVH. EF 55-60%. Moderate diastolic dysfunction, normal RV size and function. Likely aortic sclerosis without stenosis  . Chronic lower back pain   . CKD (chronic kidney disease), stage IV ([place])    Recent acute on chronic exacerbation in July 2014  . Depression   . Diabetes mellitus, type II, insulin dependent ([place])    With complications - CAD, CVA, peripheral ulcer ,  . Diabetic foot ulcer ([place])   . Diabetic peripheral neuropathy associated with type 2 diabetes mellitus  ([place])   . 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 History of blood transfusion    "w/hysterectomy"   . Hyperlipidemia LDL goal <70   . Hypertension associated with diabetes ([place])   . Obesity, Class III, BMI 40-49.9 (morbid obesity) (HCC)    BMI 43; 5' 2',  235 pounds 6.4 ounces  . On home oxygen therapy    "2.5L prn; sometimes as little as once/month" (12/19/2016)  . PAD (peripheral artery disease) ([place])     -- Most recent Dopplers April 2016 Status post left BKA. Known right PTA occlusion  . Pneumonia    "childhood"  . ST elevation myocardial infarction (STEMI) of inferior wall ([place])  03/2009   Ostial/proximal RCA occlusion treated with BMS stent  . Stroke/cerebrovascular accident ([place]) 03/18/2013; 12/19/2016   a. Right-sided weakness, mostly with balance issues and only mild weakness;; b. Slurring speach -- L Posterior Putamen CVA   Past Surgical History  Past Surgical History:  Procedure Laterality Date  . ABDOMINAL HYSTERECTOMY    . AMPUTATION Left 08/07/2013   Procedure: left midfoot amputation;  Surgeon: Marianna Payment, MD;  Location: [facility];  Service: Orthopedics;  Laterality: Left;  .  AMPUTATION Left 08/09/2013   Procedure: AMPUTATION BELOW KNEE;  Surgeon: Marianna Payment, MD;  [facility];  Service: Orthopedics;  Laterality: Left;  . AV FISTULA PLACEMENT Left 12/03/2014   Procedure: ARTERIOVENOUS (AV) FISTULA CREATION;  Surgeon: Angelia Mould, MD;  Location: [place];  Service: Vascular;  Laterality: Left;  . CARDIAC CATHETERIZATION  03/31/2009   Proximal RCA thrombotic occlusion (inferior STEMI) other coronaries but in a codominant system  . EYE SURGERY     "bleeding; vessels leaking"  . [place]   "broke her leg in 2 places when she was young"  . LEG SURGERY     hole in bone( during Childhood)  . LEG TENDON SURGERY Right 2000s   [name] fell @ [place]  . PERCUTANEOUS CORONARY STENT INTERVENTION (PCI-S)   03/31/2009   PTCA , bare-metal stent 3.5 mm x 24 mm ostium of RCA ,EF 55%  . TRANSTHORACIC ECHOCARDIOGRAM  06/2014; 12/2016   a. Technically difficult study. Normal LV size with mild LVH. EF 55-60%. Mod - GR 2 DD. Nl RV size & fxn. ~ Aortic Sclerosis w/ stenosis;; b. LVEF 65-70%, GR 1-2 DD. No RWMA, Mod LA Dilation   Family History  Family History  Problem Relation Age of Onset  . Alcoholism Mother        Died from complications  . Alcoholism Father        Died from complications  . Cancer Father        Throat  . Pneumonia Father   . Diabetes Father   . Hypertension Father   . Heart disease Unknown        Unknown, unclear. [name] is not a good historian.  Essentially no pertinent history known   Social History  reports that she quit smoking about 24 years ago. She has never used smokeless tobacco. She reports that she does not drink alcohol or use drugs. Allergies No Known Allergies Home medications Prior to Admission medications   Medication Sig Start Date End Date Taking? Authorizing Provider  atorvastatin (LIPITOR) 80 MG tablet Take 1 tablet (80 mg total) by mouth daily at 6 PM. 12/20/16  Yes Svalina, Estill Dooms, MD  buPROPion (WELLBUTRIN XL) 300 MG 24 hr tablet Take 300 mg by mouth daily.   Yes [provider]  carvedilol (COREG) 25 MG tablet Take 25 mg by mouth every 12 (twelve) hours.  11/23/14  Yes [provider]  clopidogrel (PLAVIX) 75 MG tablet Take 1 tablet (75 mg total) by mouth daily. 12/20/16  Yes Alphonzo Grieve, MD  colchicine 0.6 MG tablet Take 0.6 mg by mouth daily.  03/19/18  Yes [provider]  ferrous sulfate 325 (65 FE) MG tablet Take 325 mg by mouth daily with breakfast.   Yes [provider]  furosemide (LASIX) 40 MG tablet Take 80 mg by mouth 2 (two) times daily.  08/10/14  Yes Verlee Monte, MD  insulin glargine (LANTUS) 100 UNIT/ML injection Inject 0.45 mLs (45 Units total) into [name] skin at bedtime. [name] taking differently:  Inject 50 Units into [name] skin at bedtime.  09/15/14  Yes Ghimire, Henreitta Leber, MD  insulin lispro protamine-lispro (HUMALOG 50/50 MIX) (50-50) 100 UNIT/ML SUSP injection Inject 5-8 Units into [name] skin See admin instructions. 5 units with breakfast, 8 units with lunch, 8 units with dinner   Yes [provider]  metaxalone (SKELAXIN) 800 MG tablet Take 0.5-1 tablets (400-800 mg total) by mouth 3 (three) times daily. [name] taking differently: Take 400-800  mg by mouth 3 (three) times daily as needed for muscle spasms.  07/02/17  Yes Lysbeth Penner, FNP  Omega-3 Fatty Acids (FISH OIL) 1000 MG CAPS Take 1,000 mg by mouth 2 (two) times daily.   Yes [provider]  ezetimibe (ZETIA) 10 MG tablet Take 1 tablet (10 mg total) by mouth daily. [name] not taking: Reported on 08/09/2019 02/21/17 05/22/17  Leonie Man, MD  glucose blood (ACCU-CHEK AVIVA PLUS) test strip USE ONE STRIP TO CHECK GLUCOSE BEFORE MEALS AND AT BEDTIME 06/18/14   [provider]  Insulin Pen Needle 31G X 8 MM MISC USE TO ADMINISTER INSULIN VIA LANTUS AND NOVOLOG 4 TIMES DAILY 08/30/14   [provider]  OXYGEN Inhale 2.5 L into [name] lungs as needed.     [provider]   Liver Function Tests Recent Labs  Lab 08/09/19 1224  AST 35  ALT 10  ALKPHOS 118  BILITOT 0.9  PROT 6.7  ALBUMIN 3.1*   No results for input(s): LIPASE, AMYLASE in [name] last 168 hours. CBC Recent Labs  Lab 08/09/19 1224  WBC 5.1  NEUTROABS 3.8  HGB 9.7*  HCT 29.2*  MCV 99.3  PLT 474   Basic Metabolic Panel Recent Labs  Lab 08/09/19 1224  NA 139  K 5.4*  CL 107  CO2 22  GLUCOSE 207*  BUN 84*  CREATININE 4.09*  CALCIUM 9.3  PHOS 5.1*   Iron/TIBC/Ferritin/ %Sat    Component Value Date/Time   IRON 42 06/06/2017 1218   TIBC 241 (L) 06/06/2017 1218   FERRITIN 828 (H) 06/06/2017 1218   IRONPCTSAT 17 06/06/2017 1218    Vitals:   08/09/19 1815 08/09/19 1830 08/09/19 1845 08/09/19 1900  BP: (!)  166/59 (!) 146/59 (!) 156/78 (!) 166/83  Pulse: 73 72 73 73  Resp: _0 (!) 27  Temp:      TempSrc:      SpO2: 100% 100% 100% 100%    Exam Gen morbidly obese, pleasant, lying flat, no distress No rash, cyanosis or gangrene Sclera anicteric, throat clear  No jvd or bruits Chest clear bilat to bases RRR no MRG Abd soft ntnd no mass or ascites +bs obese GU defer MS no joint effusions or deformity, L BKA  Ext L stump and RLE w/o any edema, no wounds, skin turgor poor in LE's Neuro is alert, Ox 2, doesn't know [name] year    Home meds:  - Atorvastatin 80 qd/ clopidogrel 75 qd/ ezetimibe 10 qd  -  Home O2 2.5 L  - insulin glargine 50 u hs/ insulin 50/50 5u/ 8u/8u w/ meals tid  - furosemide 80 bid/ carvedilol 25 bid  - colchicine 0.6 qd  - bupropion 300 qd    Na 139 K 5.4 CO2 22  BUN 84  Cr 4.09  eGFR 11-12     WBC 5k  Hb 9.7  plt 157   Assessment/ Plan: 1. CKD stage 5 - has had some vomiting which could have contributed to low BS's.  Poor historian so not sure if this is acute or chronic vomiting issues.  Could be uremic but no gross exam findings to support. She may be slightly dry, hold lasix and give gentle IVF's.   Will get records from office in am.  [name] use to follow w/ Dr Moshe Cipro.  She has an AVF in L arm but is occluded (put in 2016). Will follow.  2. Hypoglycemia - per primary, is on insulin  2  types  3. PAD sp L BKA 4. HTN - BP's 150/70, on coreg and lasix at home, continue 5. Hyperkalemia - renal diet, lokelma 6. DM2 - per primary      Kelly Splinter  MD 08/09/2019, 10:14 PM

## 2019-08-09 NOTE — ED Notes (Signed)
Date and time results received: 08/09/19  (use smartphrase ".now" to insert current time)  Test: Lactic Critical Value: 3.0  Name of Provider Notified: Johana, [place] notifed at [phone]  Orders Received? Or Actions Taken?: Orders Received - See Orders for details

## 2019-08-09 NOTE — ED Notes (Signed)
Bair Hugger applied to patient.

## 2019-08-09 NOTE — H&P (Signed)
History and Physical    [name] JHE:[id number] DOB: [birth year] DOA: 08/09/2019  PCP: Katherina Mires, MD  Patient coming from: Home  I have personally briefly reviewed patient's old medical records in [place]  Chief Complaint: Hypoglycemia  HPI: [name] is a 68 y.o. female with medical history significant of coronary artery disease, peripheral arterial disease status post left BKA, chronic kidney disease stage IV with fistula placed in January 2015, CVA in 2014, diabetes mellitus, anemia of chronic disease brought by EMS to the emergency department due to low blood sugar of 36 upon arrival.  She was given orange juice along with D10 with blood sugar repeated was 106.  She takes 50 units of Lantus at bedtime, insulin lispro, 5 units in the morning, 8 units in the lunch and dinner.  She denies headache, blurry vision, lightheadedness, dizziness, chest pain, shortness of breath, nausea, vomiting, diarrhea, decreased appetite, weight loss, melena, night sweats, fever, chills, cough, congestion, urinary or bowel changes.  She lives with her daughter at home and uses wheelchair for ambulation.  She denies smoking, alcohol, illicit drug use.  She is compliant with her medications at home.  ED Course: Upon arrival in the ED she was found to have hypothermic-she was placed on Bair hugger, 500 cc of IV fluid bolus given.  Chest x-ray negative, UA is pending.  He was given 1 dose of IV Rocephin.  She was found to have AKI on chronic kidney disease stage IV.  Lactic acid: Elevated.  Potassium level 5.4-above baseline.  EDP consulted nephrology for further evaluation.  Review of Systems: As per HPI otherwise negative.    Past Medical History:  Diagnosis Date  . Anemia   . Arthritis    "right shoulder" (12/19/2016)  . CAD S/P percutaneous coronary angioplasty 5/110    status post PCI to the RCA for inferior STEMI  . Cervical cancer ([place])   . Chronic diastolic heart failure, NYHA  class 2 ([place])    Echocardiogram 06/2014:  Technically difficult study. Normal LV size with mild LVH. EF 55-60%. Moderate diastolic dysfunction, normal RV size and function. Likely aortic sclerosis without stenosis  . Chronic lower back pain   . CKD (chronic kidney disease), stage IV ([place])    Recent acute on chronic exacerbation in July 2014  . Depression   . Diabetes mellitus, type II, insulin dependent ([place])    With complications - CAD, CVA, peripheral ulcer ,  . Diabetic foot ulcer ([place])   . Diabetic peripheral neuropathy associated with type 2 diabetes mellitus ([place])   . 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 History of blood transfusion    "w/hysterectomy"   . Hyperlipidemia LDL goal <70   . Hypertension associated with diabetes ([place])   . Obesity, Class III, BMI 40-49.9 (morbid obesity) (HCC)    BMI 43; 5' 2',  235 pounds 6.4 ounces  . On home oxygen therapy    "2.5L prn; sometimes as little as once/month" (12/19/2016)  . PAD (peripheral artery disease) ([place])     -- Most recent Dopplers April 2016 Status post left BKA. Known right PTA occlusion  . Pneumonia    "childhood"  . ST elevation myocardial infarction (STEMI) of inferior wall ([place])  03/2009   Ostial/proximal RCA occlusion treated with BMS stent  . Stroke/cerebrovascular accident ([place]) 03/18/2013; 12/19/2016   a. Right-sided weakness, mostly with balance issues and only  mild weakness;; b. Slurring speach -- L Posterior Putamen CVA    Past Surgical History:  Procedure Laterality Date  . ABDOMINAL HYSTERECTOMY    . AMPUTATION Left 08/07/2013   Procedure: left midfoot amputation;  Surgeon: Marianna Payment, MD;  [facility];  Service: Orthopedics;  Laterality: Left;  . AMPUTATION Left 08/09/2013   Procedure: AMPUTATION BELOW KNEE;  Surgeon: Marianna Payment, MD;  [facility];  Service: Orthopedics;  Laterality: Left;  . AV FISTULA PLACEMENT Left 12/03/2014   Procedure:  ARTERIOVENOUS (AV) FISTULA CREATION;  Surgeon: Angelia Mould, MD;  Location: [place];  Service: Vascular;  Laterality: Left;  . CARDIAC CATHETERIZATION  03/31/2009   Proximal RCA thrombotic occlusion (inferior STEMI) other coronaries but in a codominant system  . EYE SURGERY     "bleeding; vessels leaking"  . [place]   "broke her leg in 2 places when she was young"  . LEG SURGERY     hole in bone( during Childhood)  . LEG TENDON SURGERY Right 2000s   patient fell @ [place]  . PERCUTANEOUS CORONARY STENT INTERVENTION (PCI-S)  03/31/2009   PTCA , bare-metal stent 3.5 mm x 24 mm ostium of RCA ,EF 55%  . TRANSTHORACIC ECHOCARDIOGRAM  06/2014; 12/2016   a. Technically difficult study. Normal LV size with mild LVH. EF 55-60%. Mod - GR 2 DD. Nl RV size & fxn. ~ Aortic Sclerosis w/ stenosis;; b. LVEF 65-70%, GR 1-2 DD. No RWMA, Mod LA Dilation     reports that she quit smoking about 24 years ago. She has never used smokeless tobacco. She reports that she does not drink alcohol or use drugs.  No Known Allergies  Family History  Problem Relation Age of Onset  . Alcoholism Mother        Died from complications  . Alcoholism Father        Died from complications  . Cancer Father        Throat  . Pneumonia Father   . Diabetes Father   . Hypertension Father   . Heart disease Unknown        Unknown, unclear. Patient is not a good historian.  Essentially no pertinent history known    Prior to Admission medications   Medication Sig Start Date End Date Taking? Authorizing Provider  atorvastatin (LIPITOR) 80 MG tablet Take 1 tablet (80 mg total) by mouth daily at 6 PM. 12/20/16  Yes Svalina, Estill Dooms, MD  buPROPion (WELLBUTRIN XL) 300 MG 24 hr tablet Take 300 mg by mouth daily.   Yes [provider]  carvedilol (COREG) 25 MG tablet Take 25 mg by mouth every 12 (twelve) hours.  11/23/14  Yes [provider]  clopidogrel (PLAVIX) 75 MG tablet Take 1 tablet (75 mg  total) by mouth daily. 12/20/16  Yes Alphonzo Grieve, MD  colchicine 0.6 MG tablet Take 0.6 mg by mouth daily.  03/19/18  Yes [provider]  ferrous sulfate 325 (65 FE) MG tablet Take 325 mg by mouth daily with breakfast.   Yes [provider]  furosemide (LASIX) 40 MG tablet Take 80 mg by mouth 2 (two) times daily.  08/10/14  Yes Verlee Monte, MD  insulin glargine (LANTUS) 100 UNIT/ML injection Inject 0.45 mLs (45 Units total) into the skin at bedtime. Patient taking differently: Inject 50 Units into the skin at bedtime.  09/15/14  Yes Ghimire, Henreitta Leber, MD  insulin lispro protamine-lispro (HUMALOG 50/50 MIX) (50-50) 100 UNIT/ML SUSP  injection Inject 5-8 Units into the skin See admin instructions. 5 units with breakfast, 8 units with lunch, 8 units with dinner   Yes [provider]  metaxalone (SKELAXIN) 800 MG tablet Take 0.5-1 tablets (400-800 mg total) by mouth 3 (three) times daily. Patient taking differently: Take 400-800 mg by mouth 3 (three) times daily as needed for muscle spasms.  07/02/17  Yes Lysbeth Penner, FNP  Omega-3 Fatty Acids (FISH OIL) 1000 MG CAPS Take 1,000 mg by mouth 2 (two) times daily.   Yes [provider]  ezetimibe (ZETIA) 10 MG tablet Take 1 tablet (10 mg total) by mouth daily. Patient not taking: Reported on 08/09/2019 02/21/17 05/22/17  Leonie Man, MD  glucose blood (ACCU-CHEK AVIVA PLUS) test strip USE ONE STRIP TO CHECK GLUCOSE BEFORE MEALS AND AT BEDTIME 06/18/14   [provider]  Insulin Pen Needle 31G X 8 MM MISC USE TO ADMINISTER INSULIN VIA LANTUS AND NOVOLOG 4 TIMES DAILY 08/30/14   [provider]  OXYGEN Inhale 2.5 L into the lungs as needed.     [provider]    Physical Exam: Vitals:   08/09/19 1330 08/09/19 1430 08/09/19 1600 08/09/19 1616  BP: (!) 154/59  (!) 154/67   Pulse: 74  73   Resp: 19  17   Temp:  (!) 97.5 F (36.4 C)  98.5 F (36.9 C)  TempSrc:  Oral  Oral  SpO2: 100%   100%     Constitutional: NAD, calm, comfortable Vitals:   08/09/19 1330 08/09/19 1430 08/09/19 1600 08/09/19 1616  BP: (!) 154/59  (!) 154/67   Pulse: 74  73   Resp: 19  17   Temp:  (!) 97.5 F (36.4 C)  98.5 F (36.9 C)  TempSrc:  Oral  Oral  SpO2: 100%  100%    Constitutional: Alert and oriented x3, not in acute distress, communicating well, following commands.  On bear hugger.   Eyes: PERRL, lids and conjunctivae normal ENMT: Mucous membranes are dry t. Posterior pharynx clear of any exudate or lesions.Normal dentition.  Neck: normal, supple, no masses, no thyromegaly Respiratory: clear to auscultation bilaterally, no wheezing, no crackles. Normal respiratory effort. No accessory muscle use.  Cardiovascular: Regular rate and rhythm, no murmurs / rubs / gallops. No extremity edema. 2+ pedal pulses. No carotid bruits.  Abdomen: no tenderness, no masses palpated. No hepatosplenomegaly. Bowel sounds positive.  Musculoskeletal: Left BKA noted. Skin: no rashes, lesions, ulcers. No induration Neurologic: CN 2-12 grossly intact. Sensation intact. Psychiatric: Normal judgment and insight. Alert and oriented x 3. Normal mood.    Labs on Admission: I have personally reviewed following labs and imaging studies  CBC: Recent Labs  Lab 08/09/19 1224  WBC 5.1  NEUTROABS 3.8  HGB 9.7*  HCT 29.2*  MCV 99.3  PLT 115   Basic Metabolic Panel: Recent Labs  Lab 08/09/19 1224  NA 139  K 5.4*  CL 107  CO2 22  GLUCOSE 207*  BUN 84*  CREATININE 4.09*  CALCIUM 9.3   GFR: CrCl cannot be calculated (Unknown ideal weight.). Liver Function Tests: Recent Labs  Lab 08/09/19 1224  AST 35  ALT 10  ALKPHOS 118  BILITOT 0.9  PROT 6.7  ALBUMIN 3.1*   No results for input(s): LIPASE, AMYLASE in the last 168 hours. No results for input(s): AMMONIA in the last 168 hours. Coagulation Profile: Recent Labs  Lab 08/09/19 1356  INR 1.1   Cardiac Enzymes: No results for  input(s):  CKTOTAL, CKMB, CKMBINDEX, TROPONINI in the last 168 hours. BNP (last 3 results) No results for input(s): PROBNP in the last 8760 hours. HbA1C: Recent Labs    08/09/19 1224  HGBA1C 6.1*   CBG: Recent Labs  Lab 08/09/19 1155 08/09/19 1700  GLUCAP 160* 98   Lipid Profile: No results for input(s): CHOL, HDL, LDLCALC, TRIG, CHOLHDL, LDLDIRECT in the last 72 hours. Thyroid Function Tests: No results for input(s): TSH, T4TOTAL, FREET4, T3FREE, THYROIDAB in the last 72 hours. Anemia Panel: No results for input(s): VITAMINB12, FOLATE, FERRITIN, TIBC, IRON, RETICCTPCT in the last 72 hours. Urine analysis:    Component Value Date/Time   COLORURINE YELLOW 08/09/2019 1518   APPEARANCEUR HAZY (A) 08/09/2019 1518   LABSPEC 1.011 08/09/2019 1518   PHURINE 8.0 08/09/2019 1518   GLUCOSEU NEGATIVE 08/09/2019 1518   HGBUR NEGATIVE 08/09/2019 1518   BILIRUBINUR NEGATIVE 08/09/2019 1518   KETONESUR NEGATIVE 08/09/2019 1518   PROTEINUR 30 (A) 08/09/2019 1518   UROBILINOGEN 0.2 08/24/2015 1002   NITRITE NEGATIVE 08/09/2019 1518   LEUKOCYTESUR LARGE (A) 08/09/2019 1518    Radiological Exams on Admission: Dg Chest Portable 1 View  Result Date: 08/09/2019 CLINICAL DATA:  Hypothermia, hypoglycemia EXAM: PORTABLE CHEST 1 VIEW COMPARISON:  Chest x-rays dated 01/10/2015 and 08/06/2014. FINDINGS: Stable cardiomegaly. Lungs are clear. No pleural effusion or pneumothorax seen. Osseous structures about the chest are unremarkable. IMPRESSION: 1. No active cardiopulmonary disease. No evidence of pneumonia or pulmonary edema. 2. Stable cardiomegaly. Electronically Signed   By: Franki Cabot M.D.   On: 08/09/2019 13:41    EKG: Normal sinus rhythm, no acute ST-T wave changes noted.  Assessment/Plan Principal Problem:   Hypoglycemia Active Problems:   Anemia of chronic disease   Type 2 diabetes mellitus with renal manifestations (HCC)   Peripheral arterial disease: s/p L BKA; Occluded R Pop A, 2 V  runnoff   Chronic kidney disease (CKD), stage IV (severe) (HCC)   Essential hypertension   CVA (cerebral vascular accident) ([place])   Hyperkalemia   Lactic acidosis   Hypothermia    Severe hypoglycemia: -Likely secondary to worsening kidney function.  Patient was found to have blood sugar of 28.-Orange juice and D10 was given by EMS. -Place patient under observation.  Continuous blood sugar monitoring. -We will hold insulin for now.  Hypothermia: -Likely secondary to hypoglycemia, less likely infection as patient has no leukocytosis, UA is positive for bacteria and leukocytes however patient denies any symptoms.  She received 1 dose of IV Rocephin in ED. -Currently on Bair hugger re.  Monitor temperature for now. -Urine culture and blood culture is pending.  Reassess for antibiotics tomorrow.  AKI on CKD stage IV: -Patient has underlying hypertension and diabetes. -Received 500 cc of IV bolus in ED. -Monitor kidney function.  Avoid nephrotoxic medication. -Obtain renal ultrasound.  EDP consulted nephrology for further evaluation and management.  Hyperkalemia: -Secondary to worsening kidney function.  EKG: No acute changes. -On telemetry.  Monitor BMP tomorrow.  Lactic acidosis: Likely secondary to dehydration versus worsening of kidney function. -Monitor LA levels.  Type 2 diabetes mellitus: Check A1c. -Hold sliding scale insulin for now due to episode of hypoglycemia. -Monitor blood sugar closely.  Anemia of chronic disease: -Secondary to chronic kidney disease. -H&H is stable.  Continue ferrous sulfate.  Hypertension: Elevated -Continue Coreg and monitor blood pressure closely.  Hyperlipidemia: -Continue statin.  History of CVA in 2014: At baseline. -Continue Plavix and statin.  History of PAD status post left  BKA: -Continue Plavix and statin.  DVT prophylaxis: Renal dose Lovenox. Code Status: Full code Family Communication: None present at bedside.  Plan of care  discussed with patient in length and he verbalized understanding and agreed with it. Disposition Plan: TBD Consults called: Nephrology by EDP Admission status: Observation   Mckinley Jewel MD [facility] Pager [phone]  If 7PM-7AM, please contact night-coverage [email or web address] Password [email or web address]  08/09/2019, 5:27 PM

## 2019-08-09 NOTE — Progress Notes (Signed)
Notified bedside nurse of need to draw lactic acid.  

## 2019-08-09 NOTE — ED Triage Notes (Signed)
Patient arrived by ems for hypoglycemia. Ems found BS to be 28. Given D10 and BS to 106. On arrival alert and oriented, denies pain

## 2019-08-09 NOTE — ED Notes (Signed)
Pt daughter Eartha Vonbehren [phone] Pt daughter [name] [phone]

## 2019-08-09 NOTE — ED Notes (Signed)
Unable to obtain temp x2 attempts

## 2019-08-09 NOTE — ED Notes (Signed)
Dinner tray ordered.

## 2019-08-09 NOTE — ED Provider Notes (Signed)
[name]MENT Provider Note   CSN: [id number] Arrival date & time: 08/09/19  1029     History   Chief Complaint No chief complaint on file.   HPI [name] is a 68 y.o. female.     68 y.o female with a PMH of Anemia, CAD, CKD, DM presents to the ED with a chief complaint of hypoglycemia.  Patient was brought in by EMS, had a blood glucose of 36 upon arrival, was given orange juice along with D10 with a blood sugar of 106.  Patient reports she has not been sick recently, is somewhat hard to understand, states last ate soup this morning.  Has been taking her medication appropriately.  The history is provided by the patient and a relative.    Past Medical History:  Diagnosis Date  . Anemia   . Arthritis    "right shoulder" (12/19/2016)  . CAD S/P percutaneous coronary angioplasty 5/110    status post PCI to the RCA for inferior STEMI  . Cervical cancer ([place])   . Chronic diastolic heart failure, NYHA class 2 ([place])    Echocardiogram 06/2014:  Technically difficult study. Normal LV size with mild LVH. EF 55-60%. Moderate diastolic dysfunction, normal RV size and function. Likely aortic sclerosis without stenosis  . Chronic lower back pain   . CKD (chronic kidney disease), stage IV ([place])    Recent acute on chronic exacerbation in July 2014  . Depression   . Diabetes mellitus, type II, insulin dependent ([place])    With complications - CAD, CVA, peripheral ulcer ,  . Diabetic foot ulcer ([place])   . Diabetic peripheral neuropathy associated with type 2 diabetes mellitus ([place])   . 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 History of blood transfusion    "w/hysterectomy"   . Hyperlipidemia LDL goal <70   . Hypertension associated with diabetes ([place])   . Obesity, Class III, BMI 40-49.9 (morbid obesity) (HCC)    BMI 43; 5' 2',  235 pounds 6.4 ounces  . On home oxygen therapy    "2.5L prn; sometimes as little as  once/month" (12/19/2016)  . PAD (peripheral artery disease) ([place])     -- Most recent Dopplers April 2016 Status post left BKA. Known right PTA occlusion  . Pneumonia    "childhood"  . ST elevation myocardial infarction (STEMI) of inferior wall ([place])  03/2009   Ostial/proximal RCA occlusion treated with BMS stent  . Stroke/cerebrovascular accident ([place]) 03/18/2013; 12/19/2016   a. Right-sided weakness, mostly with balance issues and only mild weakness;; b. Slurring speach -- L Posterior Putamen CVA    Patient Active Problem List   Diagnosis Date Noted  . CVA (cerebral vascular accident) ([place]) 12/19/2016  . S/P BKA (below knee amputation) unilateral ([place]) 08/06/2014  . Chronic respiratory failure with hypoxia ([place]) 08/06/2014  . Essential hypertension 06/23/2014  . Chronic diastolic congestive heart failure, NYHA class 2 ([place]) 01/12/2014  . UTI (lower urinary tract infection) 01/11/2014  . Anemia in chronic kidney disease 09/25/2013  . Hyponatremia 08/06/2013  . Chronic kidney disease (CKD), stage IV (severe) ([place]) 08/06/2013  . Severe obesity (BMI >= 40) (HCC) 06/22/2013    Class: Chronic  . Peripheral arterial disease: s/p L BKA; Occluded R Pop A, 2 V runnoff 05/01/2013  . Hyperlipidemia LDL goal <70   . Hypertension associated with diabetes ([place])   . Unspecified constipation 04/16/2013  .  Type 2 diabetes mellitus with renal manifestations ([place]) 04/16/2013  . Secondary renovascular hypertension, benign 04/09/2013  . Nausea and vomiting 03/18/2013  . Anemia of chronic disease 03/18/2013  . CVA (cerebral infarction) 03/18/2013  . Non-ketotic hyperosmolar coma ([place]) 03/18/2013  . CAD S/P PCI TO RCA for Inferior STEMI. BMS 03/31/2009  . Status post myocardial infarction of inferior wall 03/31/2009    Past Surgical History:  Procedure Laterality Date  . ABDOMINAL HYSTERECTOMY    . AMPUTATION Left 08/07/2013   Procedure: left midfoot amputation;  Surgeon: Marianna Payment, MD;   Location: [facility];  Service: Orthopedics;  Laterality: Left;  . AMPUTATION Left 08/09/2013   Procedure: AMPUTATION BELOW KNEE;  Surgeon: Marianna Payment, MD;  Location: [facility];  Service: Orthopedics;  Laterality: Left;  . AV FISTULA PLACEMENT Left 12/03/2014   Procedure: ARTERIOVENOUS (AV) FISTULA CREATION;  Surgeon: Angelia Mould, MD;  Location: [facility];  Service: Vascular;  Laterality: Left;  . CARDIAC CATHETERIZATION  03/31/2009   Proximal RCA thrombotic occlusion (inferior STEMI) other coronaries but in a codominant system  . EYE SURGERY     "bleeding; vessels leaking"  . [place]   "broke her leg in 2 places when she was young"  . LEG SURGERY     hole in bone( during Childhood)  . LEG TENDON SURGERY Right 2000s   patient fell @ [place]  . PERCUTANEOUS CORONARY STENT INTERVENTION (PCI-S)  03/31/2009   PTCA , bare-metal stent 3.5 mm x 24 mm ostium of RCA ,EF 55%  . TRANSTHORACIC ECHOCARDIOGRAM  06/2014; 12/2016   a. Technically difficult study. Normal LV size with mild LVH. EF 55-60%. Mod - GR 2 DD. Nl RV size & fxn. ~ Aortic Sclerosis w/ stenosis;; b. LVEF 65-70%, GR 1-2 DD. No RWMA, Mod LA Dilation     OB History   No obstetric history on file.      Home Medications    Prior to Admission medications   Medication Sig Start Date End Date Taking? Authorizing Provider  atorvastatin (LIPITOR) 80 MG tablet Take 1 tablet (80 mg total) by mouth daily at 6 PM. 12/20/16  Yes Svalina, Estill Dooms, MD  buPROPion (WELLBUTRIN XL) 300 MG 24 hr tablet Take 300 mg by mouth daily.   Yes [provider]  carvedilol (COREG) 25 MG tablet Take 25 mg by mouth every 12 (twelve) hours.  11/23/14  Yes [provider]  clopidogrel (PLAVIX) 75 MG tablet Take 1 tablet (75 mg total) by mouth daily. 12/20/16  Yes Alphonzo Grieve, MD  colchicine 0.6 MG tablet Take 0.6 mg by mouth daily.  03/19/18  Yes [provider]  ferrous sulfate 325 (65 FE) MG tablet Take 325 mg by mouth  daily with breakfast.   Yes [provider]  furosemide (LASIX) 40 MG tablet Take 80 mg by mouth 2 (two) times daily.  08/10/14  Yes Verlee Monte, MD  insulin glargine (LANTUS) 100 UNIT/ML injection Inject 0.45 mLs (45 Units total) into the skin at bedtime. Patient taking differently: Inject 50 Units into the skin at bedtime.  09/15/14  Yes Ghimire, Henreitta Leber, MD  insulin lispro protamine-lispro (HUMALOG 50/50 MIX) (50-50) 100 UNIT/ML SUSP injection Inject 5-8 Units into the skin See admin instructions. 5 units with breakfast, 8 units with lunch, 8 units with dinner   Yes [provider]  metaxalone (SKELAXIN) 800 MG tablet Take 0.5-1 tablets (400-800 mg total) by mouth 3 (three) times daily. Patient taking differently: Take  400-800 mg by mouth 3 (three) times daily as needed for muscle spasms.  07/02/17  Yes Lysbeth Penner, FNP  Omega-3 Fatty Acids (FISH OIL) 1000 MG CAPS Take 1,000 mg by mouth 2 (two) times daily.   Yes [provider]  ezetimibe (ZETIA) 10 MG tablet Take 1 tablet (10 mg total) by mouth daily. Patient not taking: Reported on 08/09/2019 02/21/17 05/22/17  Leonie Man, MD  glucose blood (ACCU-CHEK AVIVA PLUS) test strip USE ONE STRIP TO CHECK GLUCOSE BEFORE MEALS AND AT BEDTIME 06/18/14   [provider]  Insulin Pen Needle 31G X 8 MM MISC USE TO ADMINISTER INSULIN VIA LANTUS AND NOVOLOG 4 TIMES DAILY 08/30/14   [provider]  OXYGEN Inhale 2.5 L into the lungs as needed.     [provider]    Family History Family History  Problem Relation Age of Onset  . Alcoholism Mother        Died from complications  . Alcoholism Father        Died from complications  . Cancer Father        Throat  . Pneumonia Father   . Diabetes Father   . Hypertension Father   . Heart disease Unknown        Unknown, unclear. Patient is not a good historian.  Essentially no pertinent history known    Social History Social History    Tobacco Use  . Smoking status: Former Smoker    Quit date: 04/29/1995    Years since quitting: 24.2  . Smokeless tobacco: Never Used  Substance Use Topics  . Alcohol use: No  . Drug use: No     Allergies   Patient has no known allergies.   Review of Systems Review of Systems  Constitutional: Negative for chills and fever.  HENT: Negative for ear pain and sore throat.   Eyes: Negative for pain and visual disturbance.  Respiratory: Negative for cough and shortness of breath.   Cardiovascular: Negative for chest pain and palpitations.  Gastrointestinal: Negative for abdominal pain and vomiting.  Genitourinary: Negative for dysuria and hematuria.  Musculoskeletal: Negative for arthralgias and back pain.  Skin: Negative for color change and rash.  Neurological: Negative for seizures and syncope.  All other systems reviewed and are negative.    Physical Exam Updated Vital Signs BP (!) 154/59   Pulse 74   Temp (!) 97.5 F (36.4 C) (Oral)   Resp 19   SpO2 100%   Physical Exam Vitals signs and nursing note reviewed.  Constitutional:      General: She is not in acute distress.    Appearance: She is well-developed. She is obese. She is ill-appearing.     Comments: Acute on chronic ill appearing.   HENT:     Head: Normocephalic and atraumatic.     Mouth/Throat:     Pharynx: No oropharyngeal exudate.  Eyes:     Pupils: Pupils are equal, round, and reactive to light.  Neck:     Musculoskeletal: Normal range of motion.  Cardiovascular:     Rate and Rhythm: Normal rate and regular rhythm.     Heart sounds: Normal heart sounds.  Pulmonary:     Effort: Pulmonary effort is normal. No respiratory distress.     Breath sounds: Decreased breath sounds present. No wheezing, rhonchi or rales.  Abdominal:     General: Bowel sounds are normal. There is no distension.     Palpations: Abdomen is soft.  Tenderness: There is no abdominal tenderness.  Musculoskeletal:         General: No tenderness or deformity.     Right lower leg: No edema.     Left lower leg: No edema.     Comments: Left BKA.  Feet:     Left foot:     Amputation: Left leg is amputated above knee.  Skin:    General: Skin is cool and dry.  Neurological:     Mental Status: She is alert and oriented to person, place, and time.      ED Treatments / Results  Labs (all labs ordered are listed, but only abnormal results are displayed) Labs Reviewed  CBC WITH DIFFERENTIAL/PLATELET - Abnormal; Notable for the following components:      Result Value   RBC 2.94 (*)    Hemoglobin 9.7 (*)    HCT 29.2 (*)    All other components within normal limits  COMPREHENSIVE METABOLIC PANEL - Abnormal; Notable for the following components:   Potassium 5.4 (*)    Glucose, Bld 207 (*)    BUN 84 (*)    Creatinine, Ser 4.09 (*)    Albumin 3.1 (*)    GFR calc non Af Amer 11 (*)    GFR calc Af Amer 12 (*)    All other components within normal limits  URINALYSIS, ROUTINE W REFLEX MICROSCOPIC - Abnormal; Notable for the following components:   APPearance HAZY (*)    Protein, ur 30 (*)    Leukocytes,Ua LARGE (*)    Bacteria, UA FEW (*)    All other components within normal limits  LACTIC ACID, PLASMA - Abnormal; Notable for the following components:   Lactic Acid, Venous 3.0 (*)    All other components within normal limits  LACTIC ACID, PLASMA - Abnormal; Notable for the following components:   Lactic Acid, Venous 2.5 (*)    All other components within normal limits  APTT - Abnormal; Notable for the following components:   aPTT 20 (*)    All other components within normal limits  CULTURE, BLOOD (ROUTINE X 2)  CULTURE, BLOOD (ROUTINE X 2)  URINE CULTURE  PROTIME-INR    EKG None  Radiology Dg Chest Portable 1 View  Result Date: 08/09/2019 CLINICAL DATA:  Hypothermia, hypoglycemia EXAM: PORTABLE CHEST 1 VIEW COMPARISON:  Chest x-rays dated 01/10/2015 and 08/06/2014. FINDINGS: Stable  cardiomegaly. Lungs are clear. No pleural effusion or pneumothorax seen. Osseous structures about the chest are unremarkable. IMPRESSION: 1. No active cardiopulmonary disease. No evidence of pneumonia or pulmonary edema. 2. Stable cardiomegaly. Electronically Signed   By: Franki Cabot M.D.   On: 08/09/2019 13:41    Procedures Procedures (including critical care time)  Medications Ordered in ED Medications  sodium chloride 0.9 % bolus 500 mL (500 mLs Intravenous New Bag/Given 08/09/19 1402)  cefTRIAXone (ROCEPHIN) 2 g in sodium chloride 0.9 % 100 mL IVPB (2 g Intravenous New Bag/Given 08/09/19 1358)     Initial Impression / Assessment and Plan / ED Course  I have reviewed the triage vital signs and the nursing notes.  Pertinent labs & imaging results that were available during my care of the patient were reviewed by me and considered in my medical decision making (see chart for details).  Clinical Course as of Aug 08 1545  Sun Aug 09, 2019  1319 BUN(!): 84 [JS]  1319 Creatinine(!): 4.09 [JS]    Clinical Course User Index [JS] Janeece Fitting, PA-C  Patient with a past medical history of diabetes, CKD stage IV presents to the ED with complaints of hypoglycemia.  Picked up by EMS, with a blood sugar of 26 was given D10 on arrival which bumped her blood sugar to 106.  Patient reports she has been feeling a little run down, denies any recent sickness.  She is currently a dialysis patient, does make urine.  Lactic acid was remarkable for 3.0 trending down to 2.5.  1:21 PM attempted to contact both daughters Barnetta Chapel and Blaine at their respective phone numbers, unable to obtain any collateral history.  CMP remarkable for some hyperkalemia, patient is a dialysis patient, creatinine level seems to be worsened since her last visit 2 years ago now remarkable at 5.4.  BUN is significantly elevated.  LFTs are within normal limit.  CBC without leukocytosis, hemoglobin slightly decreased but  consistent with her previous visits.  Blood cultures along with sepsis criteria was activated on patient PT/INR within normal limits.  Chest x-ray: 1. No active cardiopulmonary disease. No evidence of pneumonia or  pulmonary edema.  2. Stable cardiomegaly.     Patient was given a dose of ceftriaxone in 500 mL of fluids to help rehydrate.  UA is currently pending.  3:48 PM  Spoke to hospitalist service who will admit patient for further management.  Portions of this note were generated with Lobbyist. Dictation errors may occur despite best attempts at proofreading.  Final Clinical Impressions(s) / ED Diagnoses   Final diagnoses:  Hypoglycemia  Hypothermia, initial encounter    ED Discharge Orders    None       Janeece Fitting, PA-C 08/09/19 1549    Maudie Flakes, MD 08/10/19 [phone]

## 2019-08-09 NOTE — ED Notes (Signed)
Pt given 8 fL oz of Orange Juice

## 2019-08-10 ENCOUNTER — Encounter (HOSPITAL_COMMUNITY): Payer: Self-pay | Admitting: *Deleted

## 2019-08-10 DIAGNOSIS — E1165 Type 2 diabetes mellitus with hyperglycemia: Secondary | ICD-10-CM | POA: Diagnosis present

## 2019-08-10 DIAGNOSIS — E875 Hyperkalemia: Secondary | ICD-10-CM | POA: Diagnosis present

## 2019-08-10 DIAGNOSIS — I10 Essential (primary) hypertension: Secondary | ICD-10-CM | POA: Diagnosis not present

## 2019-08-10 DIAGNOSIS — I252 Old myocardial infarction: Secondary | ICD-10-CM | POA: Diagnosis not present

## 2019-08-10 DIAGNOSIS — E1151 Type 2 diabetes mellitus with diabetic peripheral angiopathy without gangrene: Secondary | ICD-10-CM | POA: Diagnosis present

## 2019-08-10 DIAGNOSIS — D638 Anemia in other chronic diseases classified elsewhere: Secondary | ICD-10-CM | POA: Diagnosis not present

## 2019-08-10 DIAGNOSIS — N179 Acute kidney failure, unspecified: Secondary | ICD-10-CM | POA: Diagnosis present

## 2019-08-10 DIAGNOSIS — I5032 Chronic diastolic (congestive) heart failure: Secondary | ICD-10-CM | POA: Diagnosis present

## 2019-08-10 DIAGNOSIS — Z6841 Body Mass Index (BMI) 40.0 and over, adult: Secondary | ICD-10-CM | POA: Diagnosis not present

## 2019-08-10 DIAGNOSIS — N184 Chronic kidney disease, stage 4 (severe): Secondary | ICD-10-CM

## 2019-08-10 DIAGNOSIS — T68XXXA Hypothermia, initial encounter: Secondary | ICD-10-CM | POA: Diagnosis present

## 2019-08-10 DIAGNOSIS — E1169 Type 2 diabetes mellitus with other specified complication: Secondary | ICD-10-CM | POA: Diagnosis present

## 2019-08-10 DIAGNOSIS — I129 Hypertensive chronic kidney disease with stage 1 through stage 4 chronic kidney disease, or unspecified chronic kidney disease: Secondary | ICD-10-CM | POA: Diagnosis present

## 2019-08-10 DIAGNOSIS — E1142 Type 2 diabetes mellitus with diabetic polyneuropathy: Secondary | ICD-10-CM | POA: Diagnosis present

## 2019-08-10 DIAGNOSIS — E872 Acidosis: Secondary | ICD-10-CM | POA: Diagnosis present

## 2019-08-10 DIAGNOSIS — F329 Major depressive disorder, single episode, unspecified: Secondary | ICD-10-CM | POA: Diagnosis present

## 2019-08-10 DIAGNOSIS — D631 Anemia in chronic kidney disease: Secondary | ICD-10-CM | POA: Diagnosis present

## 2019-08-10 DIAGNOSIS — D649 Anemia, unspecified: Secondary | ICD-10-CM | POA: Diagnosis present

## 2019-08-10 DIAGNOSIS — M1990 Unspecified osteoarthritis, unspecified site: Secondary | ICD-10-CM | POA: Diagnosis present

## 2019-08-10 DIAGNOSIS — I63032 Cerebral infarction due to thrombosis of left carotid artery: Secondary | ICD-10-CM | POA: Diagnosis not present

## 2019-08-10 DIAGNOSIS — M545 Low back pain: Secondary | ICD-10-CM | POA: Diagnosis present

## 2019-08-10 DIAGNOSIS — Z20828 Contact with and (suspected) exposure to other viral communicable diseases: Secondary | ICD-10-CM | POA: Diagnosis present

## 2019-08-10 DIAGNOSIS — N185 Chronic kidney disease, stage 5: Secondary | ICD-10-CM | POA: Diagnosis present

## 2019-08-10 DIAGNOSIS — E162 Hypoglycemia, unspecified: Secondary | ICD-10-CM | POA: Diagnosis not present

## 2019-08-10 DIAGNOSIS — G8929 Other chronic pain: Secondary | ICD-10-CM | POA: Diagnosis present

## 2019-08-10 DIAGNOSIS — E11649 Type 2 diabetes mellitus with hypoglycemia without coma: Secondary | ICD-10-CM | POA: Diagnosis present

## 2019-08-10 DIAGNOSIS — E1122 Type 2 diabetes mellitus with diabetic chronic kidney disease: Secondary | ICD-10-CM | POA: Diagnosis present

## 2019-08-10 DIAGNOSIS — E785 Hyperlipidemia, unspecified: Secondary | ICD-10-CM | POA: Diagnosis present

## 2019-08-10 LAB — CBC
HCT: 24.3 % — ABNORMAL LOW (ref 36.0–46.0)
Hemoglobin: 8.2 g/dL — ABNORMAL LOW (ref 12.0–15.0)
MCH: 33.3 pg (ref 26.0–34.0)
MCHC: 33.7 g/dL (ref 30.0–36.0)
MCV: 98.8 fL (ref 80.0–100.0)
Platelets: 131 10*3/uL — ABNORMAL LOW (ref 150–400)
RBC: 2.46 MIL/uL — ABNORMAL LOW (ref 3.87–5.11)
RDW: 14.1 % (ref 11.5–15.5)
WBC: 4.9 10*3/uL (ref 4.0–10.5)
nRBC: 0 % (ref 0.0–0.2)

## 2019-08-10 LAB — GLUCOSE, CAPILLARY
Glucose-Capillary: 96 mg/dL (ref 70–99)
Glucose-Capillary: 79 mg/dL (ref 70–99)
Glucose-Capillary: 149 mg/dL — ABNORMAL HIGH (ref 70–99)
Glucose-Capillary: 179 mg/dL — ABNORMAL HIGH (ref 70–99)
Glucose-Capillary: 166 mg/dL — ABNORMAL HIGH (ref 70–99)
Glucose-Capillary: 141 mg/dL — ABNORMAL HIGH (ref 70–99)
Glucose-Capillary: 203 mg/dL — ABNORMAL HIGH (ref 70–99)
Glucose-Capillary: 158 mg/dL — ABNORMAL HIGH (ref 70–99)
Glucose-Capillary: 146 mg/dL — ABNORMAL HIGH (ref 70–99)
Glucose-Capillary: 169 mg/dL — ABNORMAL HIGH (ref 70–99)
Glucose-Capillary: 149 mg/dL — ABNORMAL HIGH (ref 70–99)

## 2019-08-10 LAB — COMPREHENSIVE METABOLIC PANEL
ALT: 11 U/L (ref 0–44)
AST: 13 U/L — ABNORMAL LOW (ref 15–41)
Albumin: 2.6 g/dL — ABNORMAL LOW (ref 3.5–5.0)
Alkaline Phosphatase: 97 U/L (ref 38–126)
Anion gap: 11 (ref 5–15)
BUN: 82 mg/dL — ABNORMAL HIGH (ref 8–23)
CO2: 22 mmol/L (ref 22–32)
Calcium: 9.2 mg/dL (ref 8.9–10.3)
Chloride: 109 mmol/L (ref 98–111)
Creatinine, Ser: 4.5 mg/dL — ABNORMAL HIGH (ref 0.44–1.00)
GFR calc Af Amer: 11 mL/min — ABNORMAL LOW (ref >60)
GFR calc non Af Amer: 9 mL/min — ABNORMAL LOW (ref >60)
Glucose, Bld: 183 mg/dL — ABNORMAL HIGH (ref 70–99)
Potassium: 4.2 mmol/L (ref 3.5–5.1)
Sodium: 142 mmol/L (ref 135–145)
Total Bilirubin: 0.7 mg/dL (ref 0.3–1.2)
Total Protein: 5.8 g/dL — ABNORMAL LOW (ref 6.5–8.1)

## 2019-08-10 LAB — TSH: TSH: 1.948 u[IU]/mL (ref 0.350–4.500)

## 2019-08-10 LAB — HIV ANTIBODY (ROUTINE TESTING W REFLEX): HIV Screen 4th Generation wRfx: NONREACTIVE

## 2019-08-10 MED ORDER — INSULIN ASPART 100 UNIT/ML ~~LOC~~ SOLN
0.0000 [IU] | Freq: Three times a day (TID) | SUBCUTANEOUS | Status: DC
  Administered 2019-08-10: 2 [IU] via SUBCUTANEOUS
  Administered 2019-08-11: 3 [IU] via SUBCUTANEOUS
  Administered 2019-08-11: 09:00:00 2 [IU] via SUBCUTANEOUS
  Administered 2019-08-11: 12:00:00 3 [IU] via SUBCUTANEOUS
  Administered 2019-08-12: 2 [IU] via SUBCUTANEOUS
  Administered 2019-08-13: 3 [IU] via SUBCUTANEOUS
  Administered 2019-08-13: 08:00:00 2 [IU] via SUBCUTANEOUS

## 2019-08-10 NOTE — Progress Notes (Signed)
[facility] Progress Note   68 y.o. year-old with hx of PAD sp L BKA, CKD IV, CVA (2014), DM2, anemia presented to ED brought by EMS for hypoglycemia episode.  Pt lives w/ her daughter at home and uses a WC.  No tobacco/ etoh use.  In ED pt was hypothermic and was therefore admitted. rec'd IV fluid bolus 500 cc, Rocephin x 1.  K 5.4, creat 4.0.   Assessment/ Plan:   1. CKD stage 4/5 - has had some vomiting which could have contributed to low BS's.  Poor historian so not sure if this is acute or chronic vomiting issues.  Could be uremic but no gross exam findings to support. She may be slightly dry, hold lasix and give gentle IVF's.   Will get records from office in am.  Patient use to follow w/ Dr Moshe Cipro.  She has an AVF in L arm but is occluded (put in 2016).  - Should have a permanent access placed soon; will consult [facility] in the AM and vein map. - She doesn't have any absolute indications for dialysis but may be starting to exhibit some uremic sxs; would like to avoid initiating HD through a catheter. 2. Hypoglycemia - per primary, is on insulin  2 types  3. PAD sp L BKA 4. HTN - BP's 150/70, on coreg and lasix at home, continue 5. Hyperkalemia - renal diet, lokelma 6. DM2 - per primary  Subjective:   No further nausea and denies dyspnea or CP   Objective:   BP (!) 140/57 (BP Location: Right Arm)   Pulse 76   Temp 98.4 F (36.9 C) (Oral)   Resp 17   SpO2 100%   Intake/Output Summary (Last 24 hours) at 08/10/2019 1841 Last data filed at 08/10/2019 1430 Gross per 24 hour  Intake 720 ml  Output 700 ml  Net 20 ml   Weight change:   Physical Exam: Gen morbidly obese, pleasant, lying flat, no distress No rash, cyanosis or gangrene Sclera anicteric, throat clear  No jvd or bruits Chest clear bilat to bases RRR no MRG Abd soft ntnd no mass or ascites +bs obese Ext L stump and RLE w/o any edema, no wounds, skin turgor poor in LE's Neuro is alert, Ox 2 Access:  no bruit in the left arm  Imaging: US Renal  Result Date: 08/09/2019 CLINICAL DATA:  Acute on chronic kidney disease EXAM: RENAL / URINARY TRACT ULTRASOUND COMPLETE COMPARISON:  06/01/2013 FINDINGS: Right Kidney: Renal measurements: 9.1 x 4.2 x 2.8 cm = volume: 57 mL. Mild renal cortical atrophy. Increased renal cortical echogenicity. No mass or hydronephrosis visualized. Left Kidney: Renal measurements: 9.0 x 5.1 x 3.3 = volume: 80 mL. Mild renal cortical atrophy. Increased renal cortical echogenicity. No mass or hydronephrosis visualized. Bladder: Appears normal for degree of bladder distention. IMPRESSION: 1. No evidence of obstructive uropathy. 2. Echogenic kidneys suggesting sequela of chronic medical renal disease. Electronically Signed   By: Davina Poke M.D.   On: 08/09/2019 17:53   Dg Chest Portable 1 View  Result Date: 08/09/2019 CLINICAL DATA:  Hypothermia, hypoglycemia EXAM: PORTABLE CHEST 1 VIEW COMPARISON:  Chest x-rays dated 01/10/2015 and 08/06/2014. FINDINGS: Stable cardiomegaly. Lungs are clear. No pleural effusion or pneumothorax seen. Osseous structures about the chest are unremarkable. IMPRESSION: 1. No active cardiopulmonary disease. No evidence of pneumonia or pulmonary edema. 2. Stable cardiomegaly. Electronically Signed   By: Franki Cabot M.D.   On: 08/09/2019 13:41    Labs: [facility]  Recent Labs  Lab 08/09/19 1224 08/10/19 0535  NA 139 142  K 5.4* 4.2  CL 107 109  CO2 22 22  GLUCOSE 207* 183*  BUN 84* 82*  CREATININE 4.09* 4.50*  CALCIUM 9.3 9.2  PHOS 5.1*  --    CBC Recent Labs  Lab 08/09/19 1224 08/10/19 0535  WBC 5.1 4.9  NEUTROABS 3.8  --   HGB 9.7* 8.2*  HCT 29.2* 24.3*  MCV 99.3 98.8  PLT 157 131*    Medications:    . atorvastatin  80 mg Oral q1800  . buPROPion  300 mg Oral Daily  . carvedilol  25 mg Oral Q12H  . clopidogrel  75 mg Oral Daily  . enoxaparin (LOVENOX) injection  30 mg Subcutaneous Q24H  . ferrous sulfate  325 mg Oral Q  breakfast  . insulin aspart  0-9 Units Subcutaneous TID WC      Otelia Santee, MD 08/10/2019, 6:41 PM

## 2019-08-10 NOTE — Progress Notes (Signed)
PROGRESS NOTE        PATIENT DETAILS Name: [name] Age: 68 y.o. Sex: female Date of Birth: 10-Mar-1951 Admit Date: 08/09/2019 Admitting Physician Mckinley Jewel, MD DZH:[id number], Jannifer Rodney, MD  Brief Narrative: Patient is a 68 y.o. female with history of CKD stage IV, PAD s/p left BKA, CAD, insulin-dependent DM-2, anemia of chronic disease-was brought to the hospital for evaluation of severe hypoglycemia, was found to have worsening renal function.  See below for further details  Subjective: Lying comfortably in bed-denies any nausea, vomiting or diarrhea.  CBG stable overnight.  Assessment/Plan: Hypoglycemia: Probably secondary to very well controlled DM-2 in the setting of worsening renal function and accumulation of long-acting insulin.  A1c is 6.1-suspect we can just manage with SSI at this point.  Given advanced age, frailty and numerous other medical comorbidities-best to allow some amount of permissive hyperglycemia in order to avoid life-threatening hypoglycemic episodes.  Hypothermia: Secondary to hypoglycemia-no indication of any infection at this point.  Hypothermia has resolved.  AKI on CKD stage IV: Not sure if this is AKI versus progression of underlying CKD stage III.  Creatinine continues to worsen-await further input from nephrology.  Volume status is relatively stable.  Hyperkalemia: Resolved-with Lokelma  Insulin-dependent DM-2 (A1c 6.1): See above.  Dyslipidemia: Continue statin  Hypertension: Controlled-continue Coreg  History of CVA: At baseline-moving all 4 extremities-continue Plavix and statin  History of PAD s/p left BKA  Anemia of chronic kidney disease: Hemoglobin stable-follow periodically  Depression: Continue Wellbutrin  Obesity: Estimated body mass index is 46.46 kg/m as calculated from the following:   Height as of 02/21/17: 5\' 2"  (1.575 m).   Weight as of 02/21/17: 115.2 kg.    Diet: Diet Order            Diet  renal with fluid restriction Fluid restriction: 1200 mL Fluid; Room service appropriate? Yes; Fluid consistency: Thin  Diet effective now               DVT Prophylaxis: Prophylactic Lovenox  Code Status: Full code   Family Communication: None at bedside-tried calling patient's daughter-unable to leave voicemail  Disposition Plan: Remain inpatient  Antimicrobial agents: Anti-infectives (From admission, onward)   Start     Dose/Rate Route Frequency Ordered Stop   08/09/19 1330  cefTRIAXone (ROCEPHIN) 2 g in sodium chloride 0.9 % 100 mL IVPB     2 g 200 mL/hr over 30 Minutes Intravenous  Once 08/09/19 1326 08/09/19 1608      Procedures: None  CONSULTS:  nephrology  Time spent: 25- minutes-Greater than 50% of this time was spent in counseling, explanation of diagnosis, planning of further management, and coordination of care.  MEDICATIONS: Scheduled Meds: . atorvastatin  80 mg Oral q1800  . buPROPion  300 mg Oral Daily  . carvedilol  25 mg Oral Q12H  . clopidogrel  75 mg Oral Daily  . enoxaparin (LOVENOX) injection  30 mg Subcutaneous Q24H  . ferrous sulfate  325 mg Oral Q breakfast  . sodium zirconium cyclosilicate  10 g Oral BID   Continuous Infusions: . sodium chloride 65 mL/hr at 08/10/19 0030   PRN Meds:.acetaminophen **OR** acetaminophen, ondansetron **OR** ondansetron (ZOFRAN) IV   PHYSICAL EXAM: Vital signs: Vitals:   08/09/19 2103 08/10/19 0049 08/10/19 0607 08/10/19 0945  BP: (!) 156/73   (!) 148/55  Pulse:  82 74 73  Resp:  14 15 15   Temp: 99 F (37.2 C)  98.9 F (37.2 C)   TempSrc:   Oral   SpO2:  99% 99% 100%   There were no vitals filed for this visit. There is no height or weight on file to calculate BMI.   Gen Exam:Alert awake-not in any distress HEENT:atraumatic, normocephalic Chest: B/L clear to auscultation anteriorly CVS:S1S2 regular Abdomen:soft non tender, non distended Extremities: Left BKA Neurology: Appears nonfocal but  has generalized weakness Skin: no rash  I have personally reviewed following labs and imaging studies  LABORATORY DATA: CBC: Recent Labs  Lab 08/09/19 1224 08/10/19 0535  WBC 5.1 4.9  NEUTROABS 3.8  --   HGB 9.7* 8.2*  HCT 29.2* 24.3*  MCV 99.3 98.8  PLT 157 131*    Basic Metabolic Panel: Recent Labs  Lab 08/09/19 1224 08/10/19 0535  NA 139 142  K 5.4* 4.2  CL 107 109  CO2 22 22  GLUCOSE 207* 183*  BUN 84* 82*  CREATININE 4.09* 4.50*  CALCIUM 9.3 9.2  MG 2.3  --   PHOS 5.1*  --     GFR: CrCl cannot be calculated (Unknown ideal weight.).  Liver Function Tests: Recent Labs  Lab 08/09/19 1224 08/10/19 0535  AST 35 13*  ALT 10 11  ALKPHOS 118 97  BILITOT 0.9 0.7  PROT 6.7 5.8*  ALBUMIN 3.1* 2.6*   No results for input(s): LIPASE, AMYLASE in the last 168 hours. No results for input(s): AMMONIA in the last 168 hours.  Coagulation Profile: Recent Labs  Lab 08/09/19 1356  INR 1.1    Cardiac Enzymes: No results for input(s): CKTOTAL, CKMB, CKMBINDEX, TROPONINI in the last 168 hours.  BNP (last 3 results) No results for input(s): PROBNP in the last 8760 hours.  HbA1C: Recent Labs    08/09/19 1224  HGBA1C 6.1*    CBG: Recent Labs  Lab 08/10/19 0130 08/10/19 0326 08/10/19 0605 08/10/19 0741 08/10/19 1013  GLUCAP 149* 179* 166* 141* 203*    Lipid Profile: No results for input(s): CHOL, HDL, LDLCALC, TRIG, CHOLHDL, LDLDIRECT in the last 72 hours.  Thyroid Function Tests: Recent Labs    08/10/19 0535  TSH 1.948    Anemia Panel: No results for input(s): VITAMINB12, FOLATE, FERRITIN, TIBC, IRON, RETICCTPCT in the last 72 hours.  Urine analysis:    Component Value Date/Time   COLORURINE YELLOW 08/09/2019 1518   APPEARANCEUR HAZY (A) 08/09/2019 1518   LABSPEC 1.011 08/09/2019 1518   PHURINE 8.0 08/09/2019 1518   GLUCOSEU NEGATIVE 08/09/2019 1518   HGBUR NEGATIVE 08/09/2019 1518   BILIRUBINUR NEGATIVE 08/09/2019 1518   KETONESUR  NEGATIVE 08/09/2019 1518   PROTEINUR 30 (A) 08/09/2019 1518   UROBILINOGEN 0.2 08/24/2015 1002   NITRITE NEGATIVE 08/09/2019 1518   LEUKOCYTESUR LARGE (A) 08/09/2019 1518    Sepsis Labs: Lactic Acid, Venous    Component Value Date/Time   LATICACIDVEN 2.5 (HH) 08/09/2019 1356    MICROBIOLOGY: Recent Results (from the past 240 hour(s))  Blood culture (routine x 2)     Status: None (Preliminary result)   Collection Time: 08/09/19 12:42 PM   Specimen: BLOOD RIGHT FOREARM  Result Value Ref Range Status   Specimen Description BLOOD RIGHT FOREARM  Final   Special Requests   Final    BOTTLES DRAWN AEROBIC AND ANAEROBIC Blood Culture adequate volume   Culture   Final    NO GROWTH < 24 HOURS Performed at [facility]  Lab, [address], [place], [place] [address]    Report Status PENDING  Incomplete  Blood culture (routine x 2)     Status: None (Preliminary result)   Collection Time: 08/09/19 12:47 PM   Specimen: BLOOD RIGHT WRIST  Result Value Ref Range Status   Specimen Description BLOOD RIGHT WRIST  Final   Special Requests   Final    BOTTLES DRAWN AEROBIC AND ANAEROBIC Blood Culture adequate volume   Culture   Final    NO GROWTH < 24 HOURS Performed at [facility], [place] [address], [place], [place] [address]    Report Status PENDING  Incomplete    RADIOLOGY STUDIES/RESULTS: US Renal  Result Date: 08/09/2019 CLINICAL DATA:  Acute on chronic kidney disease EXAM: RENAL / URINARY TRACT ULTRASOUND COMPLETE COMPARISON:  06/01/2013 FINDINGS: Right Kidney: Renal measurements: 9.1 x 4.2 x 2.8 cm = volume: 57 mL. Mild renal cortical atrophy. Increased renal cortical echogenicity. No mass or hydronephrosis visualized. Left Kidney: Renal measurements: 9.0 x 5.1 x 3.3 = volume: 80 mL. Mild renal cortical atrophy. Increased renal cortical echogenicity. No mass or hydronephrosis visualized. Bladder: Appears normal for degree of bladder distention. IMPRESSION: 1. No evidence of  obstructive uropathy. 2. Echogenic kidneys suggesting sequela of chronic medical renal disease. Electronically Signed   By: Davina Poke M.D.   On: 08/09/2019 17:53   Dg Chest Portable 1 View  Result Date: 08/09/2019 CLINICAL DATA:  Hypothermia, hypoglycemia EXAM: PORTABLE CHEST 1 VIEW COMPARISON:  Chest x-rays dated 01/10/2015 and 08/06/2014. FINDINGS: Stable cardiomegaly. Lungs are clear. No pleural effusion or pneumothorax seen. Osseous structures about the chest are unremarkable. IMPRESSION: 1. No active cardiopulmonary disease. No evidence of pneumonia or pulmonary edema. 2. Stable cardiomegaly. Electronically Signed   By: Franki Cabot M.D.   On: 08/09/2019 13:41     LOS: 0 days   Oren Binet, MD  [facility]  If 7PM-7AM, please contact night-coverage  Please page via [email or web address]  Go to [email or web address] and use [facility]'s universal password to access. If you do not have the password, please contact the hospital operator.  Locate the [facility] provider you are looking for under [facility] and page to a number that you can be directly reached. If you still have difficulty reaching the provider, please page the [facility] (Director on Call) for the Hospitalists listed on amion for assistance.  08/10/2019, 10:55 AM

## 2019-08-10 NOTE — Plan of Care (Signed)
  Problem: Clinical Measurements: Goal: Ability to maintain clinical measurements within normal limits will improve Outcome: Progressing Goal: Diagnostic test results will improve Outcome: Progressing   

## 2019-08-11 ENCOUNTER — Inpatient Hospital Stay (HOSPITAL_COMMUNITY): Payer: Medicare Other

## 2019-08-11 DIAGNOSIS — N185 Chronic kidney disease, stage 5: Secondary | ICD-10-CM

## 2019-08-11 DIAGNOSIS — N184 Chronic kidney disease, stage 4 (severe): Secondary | ICD-10-CM

## 2019-08-11 LAB — RENAL FUNCTION PANEL
Albumin: 2.5 g/dL — ABNORMAL LOW (ref 3.5–5.0)
Anion gap: 11 (ref 5–15)
BUN: 84 mg/dL — ABNORMAL HIGH (ref 8–23)
CO2: 21 mmol/L — ABNORMAL LOW (ref 22–32)
Calcium: 8.6 mg/dL — ABNORMAL LOW (ref 8.9–10.3)
Chloride: 108 mmol/L (ref 98–111)
Creatinine, Ser: 4 mg/dL — ABNORMAL HIGH (ref 0.44–1.00)
GFR calc Af Amer: 13 mL/min — ABNORMAL LOW (ref >60)
GFR calc non Af Amer: 11 mL/min — ABNORMAL LOW (ref >60)
Glucose, Bld: 247 mg/dL — ABNORMAL HIGH (ref 70–99)
Phosphorus: 4.1 mg/dL (ref 2.5–4.6)
Potassium: 4.3 mmol/L (ref 3.5–5.1)
Sodium: 140 mmol/L (ref 135–145)

## 2019-08-11 LAB — URINE CULTURE

## 2019-08-11 LAB — GLUCOSE, CAPILLARY
Glucose-Capillary: 171 mg/dL — ABNORMAL HIGH (ref 70–99)
Glucose-Capillary: 241 mg/dL — ABNORMAL HIGH (ref 70–99)
Glucose-Capillary: 202 mg/dL — ABNORMAL HIGH (ref 70–99)
Glucose-Capillary: 168 mg/dL — ABNORMAL HIGH (ref 70–99)

## 2019-08-11 LAB — CBC
HCT: 23.4 % — ABNORMAL LOW (ref 36.0–46.0)
Hemoglobin: 7.5 g/dL — ABNORMAL LOW (ref 12.0–15.0)
MCH: 31.9 pg (ref 26.0–34.0)
MCHC: 32.1 g/dL (ref 30.0–36.0)
MCV: 99.6 fL (ref 80.0–100.0)
Platelets: 125 10*3/uL — ABNORMAL LOW (ref 150–400)
RBC: 2.35 MIL/uL — ABNORMAL LOW (ref 3.87–5.11)
RDW: 14.2 % (ref 11.5–15.5)
WBC: 4.9 10*3/uL (ref 4.0–10.5)
nRBC: 0 % (ref 0.0–0.2)

## 2019-08-11 MED ORDER — CEFAZOLIN SODIUM-DEXTROSE 2-4 GM/100ML-% IV SOLN
2.0000 g | INTRAVENOUS | Status: AC
  Administered 2019-08-12: 2 g via INTRAVENOUS
  Filled 2019-08-11: qty 100

## 2019-08-11 NOTE — Consult Note (Addendum)
[facility] [place] CONSULT NOTE  VASCULAR SURGERY ASSESSMENT & PLAN:   STAGE IV/V CHRONIC KIDNEY DISEASE: We have been asked to place hemodialysis access.  She has a left brachiocephalic fistula which was placed in 2018 and is occluded.  I have reviewed her vein map.  She might potentially be a candidate for a basilic vein transposition on the left although the vein is somewhat small at the antecubital level and her upper arm is short.  The upper arm cephalic vein on the right side looks reasonable and she could potentially be a candidate for a fistula on the right.  I have discussed this with her and she is agreeable to place access in the right arm.   I have written to move her IV to the left arm.  Her hemoglobin is 7.5 however I think this is delusional given that she is several liters positive since admission.  She has a weakly palpable radial pulse bilaterally but does have a biphasic signal in both wrists.  She is scheduled for a right arm fistula or graft tomorrow by Dr. Monica Martinez.  Preop orders have been written.  I have discussed the procedure with the patient who is agreeable to proceed tomorrow. I have also discussed these plans with her daughter [name] over the phone.   [name] Mayo, MD, [place] [phone] Office: [phone]    MRN : [id number]  Reason for Consult: CKD Referring Physician: Dr. Otelia Santee  History of Present Illness: 68 y/o female with CKD stage IV/V.  Presented to the ED for hypothermia with hyperkalemia.  We have been asked to provide permanent access.  She has a history of Left brachial cephalic AV fistula that is now occluded.    Past medical history:hx of PAD sp L BKA, CKD IV, CVA (2014), DM2, CAD post angioplasty, and anemia.      Current Facility-Administered Medications  Medication Dose Route Frequency Provider Last Rate Last Dose  . 0.45 % sodium chloride infusion   Intravenous Continuous Roney Jaffe, MD  65 mL/hr at 08/10/19 1539    . acetaminophen (TYLENOL) tablet 650 mg  650 mg Oral Q6H PRN Pahwani, Rinka R, MD       Or  . acetaminophen (TYLENOL) suppository 650 mg  650 mg Rectal Q6H PRN Pahwani, Rinka R, MD      . atorvastatin (LIPITOR) tablet 80 mg  80 mg Oral q1800 Pahwani, Rinka R, MD   80 mg at 08/10/19 1804  . buPROPion (WELLBUTRIN XL) 24 hr tablet 300 mg  300 mg Oral Daily Pahwani, Rinka R, MD   300 mg at 08/11/19 0958  . carvedilol (COREG) tablet 25 mg  25 mg Oral Q12H Pahwani, Rinka R, MD   25 mg at 08/11/19 0959  . clopidogrel (PLAVIX) tablet 75 mg  75 mg Oral Daily Pahwani, Rinka R, MD   75 mg at 08/11/19 0958  . enoxaparin (LOVENOX) injection 30 mg  30 mg Subcutaneous Q24H Pahwani, Rinka R, MD   30 mg at 08/10/19 1804  . ferrous sulfate tablet 325 mg  325 mg Oral Q breakfast Pahwani, Rinka R, MD   325 mg at 08/11/19 0958  . insulin aspart (novoLOG) injection 0-9 Units  0-9 Units Subcutaneous TID WC Jonetta Osgood, MD   2 Units at 08/11/19 0840  . ondansetron (ZOFRAN) tablet 4 mg  4 mg Oral Q6H PRN Pahwani, Rinka R, MD       Or  . ondansetron (ZOFRAN) injection 4  mg  4 mg Intravenous Q6H PRN Pahwani, Rinka R, MD        Pt meds include: Statin :Yes Betablocker: Yes ASA: No Other anticoagulants/antiplatelets: Plavix  Past Medical History:  Diagnosis Date  . Anemia   . Arthritis    "right shoulder" (12/19/2016)  . CAD S/P percutaneous coronary angioplasty 5/110    status post PCI to the RCA for inferior STEMI  . Cervical cancer ([place])   . Chronic diastolic heart failure, NYHA class 2 ([place])    Echocardiogram 06/2014:  Technically difficult study. Normal LV size with mild LVH. EF 55-60%. Moderate diastolic dysfunction, normal RV size and function. Likely aortic sclerosis without stenosis  . Chronic lower back pain   . CKD (chronic kidney disease), stage IV ([place])    Recent acute on chronic exacerbation in July 2014  . Depression   . Diabetes mellitus, type II, insulin  dependent ([place])    With complications - CAD, CVA, peripheral ulcer ,  . Diabetic foot ulcer ([place])   . Diabetic peripheral neuropathy associated with type 2 diabetes mellitus ([place])   . 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 History of blood transfusion    "w/hysterectomy"   . Hyperlipidemia LDL goal <70   . Hypertension associated with diabetes ([place])   . Obesity, Class III, BMI 40-49.9 (morbid obesity) (H[facility])    BMI 43; 5' 2',  235 pounds 6.4 ounces  . On home oxygen therapy    "2.5L prn; sometimes as little as once/month" (12/19/2016)  . PAD (peripheral artery disease) ([place])     -- Most recent Dopplers April 2016 Status post left BKA. Known right PTA occlusion  . Pneumonia    "childhood"  . ST elevation myocardial infarction (STEMI) of inferior wall ([place])  03/2009   Ostial/proximal RCA occlusion treated with BMS stent  . Stroke/cerebrovascular accident ([place]) 03/18/2013; 12/19/2016   a. Right-sided weakness, mostly with balance issues and only mild weakness;; b. Slurring speach -- L Posterior Putamen CVA    Past Surgical History:  Procedure Laterality Date  . ABDOMINAL HYSTERECTOMY    . AMPUTATION Left 08/07/2013   Procedure: left midfoot amputation;  Surgeon: Marianna Payment, MD;  Location: [facility];  Service: Orthopedics;  Laterality: Left;  . AMPUTATION Left 08/09/2013   Procedure: AMPUTATION BELOW KNEE;  Surgeon: Marianna Payment, MD;  Location: [facility];  Service: Orthopedics;  Laterality: Left;  . AV FISTULA PLACEMENT Left 12/03/2014   Procedure: ARTERIOVENOUS (AV) FISTULA CREATION;  Surgeon: Angelia Mould, MD;  Location: [facility];  Service: Vascular;  Laterality: Left;  . CARDIAC CATHETERIZATION  03/31/2009   Proximal RCA thrombotic occlusion (inferior STEMI) other coronaries but in a codominant system  . EYE SURGERY     "bleeding; vessels leaking"  . [place]   "broke her leg in 2 places when she was young"  . LEG  SURGERY     hole in bone( during Childhood)  . LEG TENDON SURGERY Right 2000s   patient fell @ [facility]  . PERCUTANEOUS CORONARY STENT INTERVENTION (PCI-S)  03/31/2009   PTCA , bare-metal stent 3.5 mm x 24 mm ostium of RCA ,EF 55%  . TRANSTHORACIC ECHOCARDIOGRAM  06/2014; 12/2016   a. Technically difficult study. Normal LV size with mild LVH. EF 55-60%. Mod - GR 2 DD. Nl RV size & fxn. ~ Aortic Sclerosis w/ stenosis;; b. LVEF 65-70%, GR 1-2  DD. No RWMA, Mod LA Dilation    Social History Social History   Tobacco Use  . Smoking status: Former Smoker    Quit date: 04/29/1995    Years since quitting: 24.3  . Smokeless tobacco: Never Used  Substance Use Topics  . Alcohol use: No  . Drug use: No    Family History Family History  Problem Relation Age of Onset  . Alcoholism Mother        Died from complications  . Alcoholism Father        Died from complications  . Cancer Father        Throat  . Pneumonia Father   . Diabetes Father   . Hypertension Father   . Heart disease Other        Unknown, unclear. Patient is not a good historian.  Essentially no pertinent history known    No Known Allergies   REVIEW OF SYSTEMS  General: [ ]  Weight loss, [ ]  Fever, [ ]  chills Neurologic: [ ]  Dizziness, [ ]  Blackouts, [ ]  Seizure [ ]  Stroke, [ ]  "Mini stroke", [ ]  Slurred speech, [ ]  Temporary blindness; [ ]  weakness in arms or legs, [ ]  Hoarseness [ ]  Dysphagia Cardiac: [ ]  Chest pain/pressure, [ ]  Shortness of breath at rest [ ]  Shortness of breath with exertion, [ ]  Atrial fibrillation or irregular heartbeat  Vascular: [ ]  Pain in legs with walking, [ ]  Pain in legs at rest, [ ]  Pain in legs at night,  [ ]  Non-healing ulcer, [ ]  Blood clot in vein/DVT,   Pulmonary: [ ]  Home oxygen, [ ]  Productive cough, [ ]  Coughing up blood, [ ]  Asthma,  [ ]  Wheezing [ ]  COPD Musculoskeletal:  [ ]  Arthritis, [ ]  Low back pain, [ ]  Joint pain Hematologic: [ ]  Easy Bruising, [x ] Anemia; [ ]   Hepatitis Gastrointestinal: [ ]  Blood in stool, [ ]  Gastroesophageal Reflux/heartburn, Urinary: [x ] chronic Kidney disease, [ ]  on HD - [ ]  MWF or [ ]  TTHS, [ ]  Burning with urination, [ ]  Difficulty urinating Skin: [ ]  Rashes, [ ]  Wounds Psychological: [ ]  Anxiety, [x ] Depression  Physical Examination Vitals:   08/10/19 0945 08/10/19 1341 08/10/19 2020 08/11/19 0421  BP: (!) 148/55 (!) 140/57 (!) 134/56 (!) 133/54  Pulse: 73 76 75 83  Resp: 15 17    Temp:  98.4 F (36.9 C) 98.2 F (36.8 C) 98.8 F (37.1 C)  TempSrc:  Oral Oral Oral  SpO2: 100% 100% 96% 99%  Weight:    96.6 kg   Body mass index is 38.95 kg/m.  General:  WDWN in NAD Gait: Normal HENT: WNL Eyes: Pupils equal Pulmonary: normal non-labored breathing , without Rales, rhonchi,  wheezing Cardiac: RRR, without  Murmurs, rubs or gallops; No carotid bruits Abdomen: soft, NT, no masses Skin: no rashes, ulcers noted;  no Gangrene , no cellulitis; no open wounds;   Vascular Exam/Pulses:palpable radial and brachial   Musculoskeletal: no muscle wasting or atrophy; no edema  Neurologic: A&O X 3; Appropriate Affect ;  SENSATION: normal; MOTOR FUNCTION: 5/5 Symmetric, s/p left BKA Speech is fluent/normal   Significant Diagnostic Studies: CBC Lab Results  Component Value Date   WBC 4.9 08/11/2019   HGB 7.5 (L) 08/11/2019   HCT 23.4 (L) 08/11/2019   MCV 99.6 08/11/2019   PLT 125 (L) 08/11/2019    BMET    Component Value Date/Time   NA 140 08/11/2019 0332  K 4.3 08/11/2019 0332   CL 108 08/11/2019 0332   CO2 21 (L) 08/11/2019 0332   GLUCOSE 247 (H) 08/11/2019 0332   BUN 84 (H) 08/11/2019 0332   CREATININE 4.00 (H) 08/11/2019 0332   CREATININE 3.51 (H) 03/14/2015 1425   CALCIUM 8.6 (L) 08/11/2019 0332   CALCIUM 9.6 06/06/2017 1218   GFRNONAA 11 (L) 08/11/2019 0332   GFRAA 13 (L) 08/11/2019 0332   CrCl cannot be calculated (Unknown ideal weight.).  COAG Lab Results  Component Value Date   INR  1.1 08/09/2019   INR 1.03 12/19/2016   INR 1.09 03/06/2015     Non-Invasive Vascular Imaging: pending vein mapping  ASSESSMENT/PLAN:  CKD stageIV/V  She previous access of left BC fistula created 2016 by Dr. Scot Dock that was never used and isnow occluded.  She now needs new access.  She is not yet on HD.    She is right hand dominant.   Roxy Horseman 08/11/2019 12:03 PM

## 2019-08-11 NOTE — TOC Progression Note (Addendum)
Transition of Care [facility]) - Progression Note    Patient Details  Name: [name] MRN: [id number] Date of Birth: [birth year]  Transition of Care [facility] CM/SW Contact  Eileen Stanford, LCSW Phone Number: 08/11/2019, 3:55 PM  Clinical Narrative:   Pt is declining SNF and wants to go home with home health. Pt lives with two daughters. One of pt's daughter in [place] in pt's decision making. [name] request that pt get PT, OT, RN, RN AID. MD notified. CSW will search for [facility] agency that will take pt given insurance. [name] [name], [name]) contacts updated in chart.  [facility] declined [facility] declined [facility] declined [facility] reviewing    Expected Discharge Plan: [place] Barriers to Discharge: Continued Medical Work up  Expected Discharge Plan and Services Expected Discharge Plan: [place] In-house Referral: NA   Post Acute Care Choice: [place] arrangements for the past 2 months: Single Family Home                           HH Arranged: PT, OT, Nurse's Aide, RN           Social Determinants of Health (SDOH) Interventions    Readmission Risk Interventions No flowsheet data found.

## 2019-08-11 NOTE — Progress Notes (Signed)
PROGRESS NOTE        PATIENT DETAILS Name: [name] Age: 68 y.o. Sex: female Date of Birth: 10-11-1951 Admit Date: 08/09/2019 Admitting Physician Mckinley Jewel, MD IBB:[id number], Jannifer Rodney, MD  Brief Narrative: Patient is a 68 y.o. female with history of CKD stage IV, PAD s/p left BKA, CAD, insulin-dependent DM-2, anemia of chronic disease-was brought to the hospital for evaluation of severe hypoglycemia, was found to have worsening renal function.  See below for further details  Subjective: She is awake and alert-no major issues overnight.  Assessment/Plan: Hypoglycemia: Probably secondary to very well controlled DM-2 in the setting of worsening renal function and accumulation of long-acting insulin.  A1c is 6.1-CBGs are stable with just SSI.  Given advanced age, frailty and numerous other medical comorbidities-best to allow some amount of permissive hyperglycemia in order to avoid life-threatening hypoglycemic episodes.  Hypothermia: Secondary to hypoglycemia-no indication of any infection at this point.  Hypothermia has resolved.  AKI on CKD stage IV: Not sure if this is AKI versus progression of underlying CKD stage III.  Creatinine is better today compared to yesterday- Vascular surgery consulted for permanent access-as her left brachiocephalic fistula is fortunately occluded.  Hyperkalemia: Resolved-with Lokelma  Insulin-dependent DM-2 (A1c 6.1): See above.  Dyslipidemia: Continue statin  Hypertension: Controlled-continue Coreg  History of CVA: At baseline-moving all 4 extremities-continue Plavix and statin  History of PAD s/p left BKA  Anemia of chronic kidney disease: Hemoglobin low but stable-likely secondary to CKD.  No evidence of blood loss.  Follow.  Depression: Continue Wellbutrin  Obesity: Estimated body mass index is 38.95 kg/m as calculated from the following:   Height as of 02/21/17: 5\' 2"  (1.575 m).   Weight as of this encounter:  96.6 kg.    Diet: Diet Order            Diet renal with fluid restriction Fluid restriction: 1200 mL Fluid; Room service appropriate? Yes; Fluid consistency: Thin  Diet effective now               DVT Prophylaxis: Prophylactic Lovenox  Code Status: Full code   Family Communication: Phone conversation with numerous daughters on 9/22.  Disposition Plan: Remain inpatient  Antimicrobial agents: Anti-infectives (From admission, onward)   Start     Dose/Rate Route Frequency Ordered Stop   08/09/19 1330  cefTRIAXone (ROCEPHIN) 2 g in sodium chloride 0.9 % 100 mL IVPB     2 g 200 mL/hr over 30 Minutes Intravenous  Once 08/09/19 1326 08/09/19 1608      Procedures: None  CONSULTS:  nephrology  Time spent: 25- minutes-Greater than 50% of this time was spent in counseling, explanation of diagnosis, planning of further management, and coordination of care.  MEDICATIONS: Scheduled Meds:  atorvastatin  80 mg Oral q1800   buPROPion  300 mg Oral Daily   carvedilol  25 mg Oral Q12H   clopidogrel  75 mg Oral Daily   enoxaparin (LOVENOX) injection  30 mg Subcutaneous Q24H   ferrous sulfate  325 mg Oral Q breakfast   insulin aspart  0-9 Units Subcutaneous TID WC   Continuous Infusions:  sodium chloride 65 mL/hr at 08/10/19 1539   PRN Meds:.acetaminophen **OR** acetaminophen, ondansetron **OR** ondansetron (ZOFRAN) IV   PHYSICAL EXAM: Vital signs: Vitals:   08/10/19 2020 08/11/19 0421 08/11/19 1300 08/11/19 1333  BP: Marland Kitchen)  134/56 (!) 133/54 (!) 151/61 (!) 151/61  Pulse: 75 83 83 82  Resp:   16   Temp: 98.2 F (36.8 C) 98.8 F (37.1 C) 99.1 F (37.3 C) 99.1 F (37.3 C)  TempSrc: Oral Oral Oral Oral  SpO2: 96% 99% 100% 100%  Weight:  96.6 kg     Filed Weights   08/11/19 0421  Weight: 96.6 kg   Body mass index is 38.95 kg/m.   Gen Exam:Alert awake-not in any distress HEENT:atraumatic, normocephalic Chest: B/L clear to auscultation anteriorly CVS:S1S2  regular Abdomen:soft non tender, non distended Extremities: Left BKA Neurology: Non focal Skin: no rash  I have personally reviewed following labs and imaging studies  LABORATORY DATA: CBC: Recent Labs  Lab 08/09/19 1224 08/10/19 0535 08/11/19 0332  WBC 5.1 4.9 4.9  NEUTROABS 3.8  --   --   HGB 9.7* 8.2* 7.5*  HCT 29.2* 24.3* 23.4*  MCV 99.3 98.8 99.6  PLT 157 131* 125*    Basic Metabolic Panel: Recent Labs  Lab 08/09/19 1224 08/10/19 0535 08/11/19 0332  NA 139 142 140  K 5.4* 4.2 4.3  CL 107 109 108  CO2 22 22 21*  GLUCOSE 207* 183* 247*  BUN 84* 82* 84*  CREATININE 4.09* 4.50* 4.00*  CALCIUM 9.3 9.2 8.6*  MG 2.3  --   --   PHOS 5.1*  --  4.1    GFR: CrCl cannot be calculated (Unknown ideal weight.).  Liver Function Tests: Recent Labs  Lab 08/09/19 1224 08/10/19 0535 08/11/19 0332  AST 35 13*  --   ALT 10 11  --   ALKPHOS 118 97  --   BILITOT 0.9 0.7  --   PROT 6.7 5.8*  --   ALBUMIN 3.1* 2.6* 2.5*   No results for input(s): LIPASE, AMYLASE in the last 168 hours. No results for input(s): AMMONIA in the last 168 hours.  Coagulation Profile: Recent Labs  Lab 08/09/19 1356  INR 1.1    Cardiac Enzymes: No results for input(s): CKTOTAL, CKMB, CKMBINDEX, TROPONINI in the last 168 hours.  BNP (last 3 results) No results for input(s): PROBNP in the last 8760 hours.  HbA1C: Recent Labs    08/09/19 1224  HGBA1C 6.1*    CBG: Recent Labs  Lab 08/10/19 1357 08/10/19 1619 08/10/19 2106 08/11/19 0742 08/11/19 1112  GLUCAP 146* 169* 149* 171* 241*    Lipid Profile: No results for input(s): CHOL, HDL, LDLCALC, TRIG, CHOLHDL, LDLDIRECT in the last 72 hours.  Thyroid Function Tests: Recent Labs    08/10/19 0535  TSH 1.948    Anemia Panel: No results for input(s): VITAMINB12, FOLATE, FERRITIN, TIBC, IRON, RETICCTPCT in the last 72 hours.  Urine analysis:    Component Value Date/Time   COLORURINE YELLOW 08/09/2019 1518    APPEARANCEUR HAZY (A) 08/09/2019 1518   LABSPEC 1.011 08/09/2019 1518   PHURINE 8.0 08/09/2019 1518   GLUCOSEU NEGATIVE 08/09/2019 1518   HGBUR NEGATIVE 08/09/2019 1518   BILIRUBINUR NEGATIVE 08/09/2019 1518   KETONESUR NEGATIVE 08/09/2019 1518   PROTEINUR 30 (A) 08/09/2019 1518   UROBILINOGEN 0.2 08/24/2015 1002   NITRITE NEGATIVE 08/09/2019 1518   LEUKOCYTESUR LARGE (A) 08/09/2019 1518    Sepsis Labs: Lactic Acid, Venous    Component Value Date/Time   LATICACIDVEN 2.5 (HH) 08/09/2019 1356    MICROBIOLOGY: Recent Results (from the past 240 hour(s))  Blood culture (routine x 2)     Status: None (Preliminary result)   Collection Time: 08/09/19 12:42  PM   Specimen: BLOOD RIGHT FOREARM  Result Value Ref Range Status   Specimen Description BLOOD RIGHT FOREARM  Final   Special Requests   Final    BOTTLES DRAWN AEROBIC AND ANAEROBIC Blood Culture adequate volume   Culture   Final    NO GROWTH 2 DAYS Performed at [facility], [address], [place], [place] [address]    Report Status PENDING  Incomplete  Blood culture (routine x 2)     Status: None (Preliminary result)   Collection Time: 08/09/19 12:47 PM   Specimen: BLOOD RIGHT WRIST  Result Value Ref Range Status   Specimen Description BLOOD RIGHT WRIST  Final   Special Requests   Final    BOTTLES DRAWN AEROBIC AND ANAEROBIC Blood Culture adequate volume   Culture   Final    NO GROWTH 2 DAYS Performed at [facility], [place] [address], [place], [place] [address]    Report Status PENDING  Incomplete  Urine culture     Status: Abnormal   Collection Time: 08/09/19  3:04 PM   Specimen: In/Out Cath Urine  Result Value Ref Range Status   Specimen Description IN/OUT CATH URINE  Final   Special Requests   Final    NONE Performed at [facility], [place] [address], [place], [place] [address]    Culture MULTIPLE SPECIES PRESENT, SUGGEST RECOLLECTION (A)  Final   Report Status 08/11/2019 FINAL  Final     RADIOLOGY STUDIES/RESULTS: US Renal  Result Date: 08/09/2019 CLINICAL DATA:  Acute on chronic kidney disease EXAM: RENAL / URINARY TRACT ULTRASOUND COMPLETE COMPARISON:  06/01/2013 FINDINGS: Right Kidney: Renal measurements: 9.1 x 4.2 x 2.8 cm = volume: 57 mL. Mild renal cortical atrophy. Increased renal cortical echogenicity. No mass or hydronephrosis visualized. Left Kidney: Renal measurements: 9.0 x 5.1 x 3.3 = volume: 80 mL. Mild renal cortical atrophy. Increased renal cortical echogenicity. No mass or hydronephrosis visualized. Bladder: Appears normal for degree of bladder distention. IMPRESSION: 1. No evidence of obstructive uropathy. 2. Echogenic kidneys suggesting sequela of chronic medical renal disease. Electronically Signed   By: Davina Poke M.D.   On: 08/09/2019 17:53   Dg Chest Portable 1 View  Result Date: 08/09/2019 CLINICAL DATA:  Hypothermia, hypoglycemia EXAM: PORTABLE CHEST 1 VIEW COMPARISON:  Chest x-rays dated 01/10/2015 and 08/06/2014. FINDINGS: Stable cardiomegaly. Lungs are clear. No pleural effusion or pneumothorax seen. Osseous structures about the chest are unremarkable. IMPRESSION: 1. No active cardiopulmonary disease. No evidence of pneumonia or pulmonary edema. 2. Stable cardiomegaly. Electronically Signed   By: Franki Cabot M.D.   On: 08/09/2019 13:41     LOS: 1 day   Oren Binet, MD  [facility]  If 7PM-7AM, please contact night-coverage  Please page via [email or web address]  Go to [email or web address] and use [facility] universal password to access. If you do not have the password, please contact the hospital operator.  Locate the [facility] provider you are looking for under [facility] and page to a number that you can be directly reached. If you still have difficulty reaching the provider, please page the [facility] (Director on Call) for the Hospitalists listed on amion for assistance.  08/11/2019, 2:14 PM

## 2019-08-11 NOTE — Evaluation (Signed)
Physical Therapy Evaluation Patient Details Name: [name] MRN: [id number] DOB: Mar 03, 1951 Today's Date: 08/11/2019   History of Present Illness  68 y.o. female PMH coronary artery disease, peripheral arterial disease status post left BKA, chronic kidney disease stage IV with fistula placed in January 2015, CVA in 2014, diabetes mellitus, anemia of chronic disease brought by EMS to ED low blood sugar of 36, hypothermia and worsening renal function. Admitted 9/20 for hypoglycemia, hypothermia and AKI  Clinical Impression  PTA pt living with daughter in single story home with level entry. Pt reports spending most of her day in bed but she is able to independently transfer to [facility]. Pt reports that she only uses her prosthetic for transfers to wc and only uses wc for mobility in public. [name] and granddaughter assist with bathing and dressing. [name] will start back as school bus driver in October, at which time pt will be left alone for periods throughout the day. Pt voices concern about this. Pt is limited in mobility by generalized weakness, and decreased mobility due to R LE BKA. Pt currently requires min A for bed mobility. Pt unable to attempt transfers due to no drop arm recliner in room. PT will bring w/c in next session to practice transfers. Given pt's decreased mobility, PT recommends SNF level rehab at d/c. Pt has already stated she will refuse. Pt will continue to work with pt on strength and transfers acutely.     Follow Up Recommendations SNF;Supervision/Assistance - 24 hour    Equipment Recommendations  None recommended by PT    Recommendations for Other Services OT consult     Precautions / Restrictions Precautions Precautions: Fall Required Braces or Orthoses: Other Brace Other Brace: L BK prosthetic      Mobility  Bed Mobility Overal bed mobility: Needs Assistance Bed Mobility: Supine to Sit;Sit to Supine     Supine to sit: Min assist Sit to supine:  Min assist   General bed mobility comments: minA for supporting trunk to come to upright, min A for bringing L LE back into bed  Transfers                 General transfer comment: unable to attempt due to no drop arm recliner        Balance Overall balance assessment: Needs assistance Sitting-balance support: Feet supported;No upper extremity supported Sitting balance-Leahy Scale: Fair                                       Pertinent Vitals/Pain Pain Assessment: No/denies pain    Home Living Family/patient expects to be discharged to:: Private residence Living Arrangements: Children(lives with daughter) Available Help at Discharge: Family;Available PRN/intermittently Type of Home: House Home Access: Level entry     Home Layout: One level Home Equipment: Walker - 2 wheels;Bedside commode;Wheelchair - manual      Prior Function Level of Independence: Needs assistance   Gait / Transfers Assistance Needed: pt spends day in bed and is independent in squat pivot transfer to drop arm BSC, dons prosthetic for transfer to [facility], only uses wc for going out of house for doctor's appointments  ADL's / Homemaking Assistance Needed: daughter and daughter assist in bathing and dressing, and iADLs           Extremity/Trunk Assessment   Upper Extremity Assessment Upper Extremity Assessment: Overall WFL for tasks assessed  Lower Extremity Assessment Lower Extremity Assessment: RLE deficits/detail;LLE deficits/detail RLE Deficits / Details: L BKA, hip and knee ROM WFL, strength grossly assess in hip and knee to be 3+/5 LLE Deficits / Details: ROM WFL, strength grossly assessed at 4/5 LLE Sensation: WNL       Communication   Communication: No difficulties  Cognition Arousal/Alertness: Awake/alert Behavior During Therapy: WFL for tasks assessed/performed Overall Cognitive Status: Within Functional Limits for tasks assessed                                         General Comments General comments (skin integrity, edema, etc.): VSS, pt very cold throughout session,     Exercises General Exercises - Lower Extremity Ankle Circles/Pumps: Left;Seated;10 reps Long Arc Quad: AROM;Both;10 reps;Seated Hip ABduction/ADduction: AROM;Both;10 reps;Seated Hip Flexion/Marching: AROM;Both;10 reps;Seated   Assessment/Plan    PT Assessment Patient needs continued PT services  PT Problem List Decreased strength;Decreased activity tolerance;Decreased mobility       PT Treatment Interventions DME instruction;Functional mobility training;Balance training;Therapeutic activities;Therapeutic exercise;Cognitive remediation;Patient/family education;Wheelchair mobility training    PT Goals (Current goals can be found in the Care Plan section)  Acute Rehab PT Goals Patient Stated Goal: get warm PT Goal Formulation: With patient Time For Goal Achievement: 08/25/19 Potential to Achieve Goals: Fair    Frequency Min 2X/week   Barriers to discharge Decreased caregiver support daughter works as a Teacher, early years/pre so long periods of day pt left alone       AM-PAC PT "6 Clicks" Mobility  Outcome Measure Help needed turning from your back to your side while in a flat bed without using bedrails?: None Help needed moving from lying on your back to sitting on the side of a flat bed without using bedrails?: A Little Help needed moving to and from a bed to a chair (including a wheelchair)?: A Lot Help needed standing up from a chair using your arms (e.g., wheelchair or bedside chair)?: Total Help needed to walk in hospital room?: Total Help needed climbing 3-5 steps with a railing? : Total 6 Click Score: 12    End of Session   Activity Tolerance: Patient tolerated treatment well Patient left: in bed;with call bell/phone within reach;with bed alarm set Nurse Communication: Mobility status PT Visit Diagnosis: Other abnormalities of gait and  mobility (R26.89);Muscle weakness (generalized) (M62.81);Difficulty in walking, not elsewhere classified (R26.2)    Time: 7824-2353 PT Time Calculation (min) (ACUTE ONLY): 19 min   Charges:   PT Evaluation $PT Eval Moderate Complexity: 1 Mod          Charlies Rayburn B. Migdalia Dk PT, DPT Acute Rehabilitation Services Pager [phone] Office [phone]   [place] 08/11/2019, 1:21 PM

## 2019-08-11 NOTE — Progress Notes (Signed)
UE vein mapping       has been completed. Preliminary results can be found under CV proc through chart review. Gwenith Tschida, BS, RDMS, RVT   

## 2019-08-11 NOTE — Progress Notes (Signed)
[facility] Progress Note   68 y.o.year-old with hx of PAD sp L BKA, CKD IV, CVA (2014), DM2, anemia presented to ED brought by EMS for hypoglycemia episode. Pt lives w/ her daughter at home and uses a WC. No tobacco/ etoh use. In ED pt was hypothermic and was therefore admitted. rec'd IV fluid bolus 500 cc, Rocephin x 1. K 5.4, creat 4.0.  Assessment/ Plan:   1. CKD stage 4/5 - has had some vomiting which could have contributed to low BS's. Poor historian so not sure if this is acute or chronic vomiting issues. Could be uremic but no gross exam findings to support. She may be slightly dry, hold lasix and give gentle IVF's. Will get records from office in am. Patient use to follow w/ Dr Moshe Cipro. She has an AVF in L arm but is occluded (put in 2016).  - Should have a permanent access placed soon; appreciate t VVS seeing the pt.  - She doesn't have any absolute indications for dialysis but may be starting to exhibit some uremic sxs; would like to avoid initiating HD through a catheter. Likely to initiate HD in under 6 mths. She's asking to go home so I am not opposed to having this done within the next month  2. Hypoglycemia - per primary, is on insulin 2 types  3. PAD sp L BKA 4. HTN - BP's 150/70, on coreg and lasix at home, continue 5. Hyperkalemia - renal diet, lokelma 6. DM2 - per primary  Subjective:   No further nausea and denies dyspnea or CP   Objective:   BP (!) 151/61   Pulse 82   Temp 99.1 F (37.3 C) (Oral)   Resp 16   Wt 96.6 kg   SpO2 100%   BMI 38.95 kg/m   Intake/Output Summary (Last 24 hours) at 08/11/2019 1536 Last data filed at 08/11/2019 0428 Gross per 24 hour  Intake 1714.31 ml  Output 1000 ml  Net 714.31 ml   Weight change:   Physical Exam: Genmorbidly obese, pleasant, lying flat, no distress No rash, cyanosis or gangrene Sclera anicteric, throat clear  No jvd or bruits Chest clear bilatto bases RRR no MRG Abd soft  ntnd no mass or ascites +bsobese ExtL stump and RLE w/o anyedema, no wounds, skin turgor poor in LE's Neuro is alert, Ox2 Access: no bruit in the left arm  Imaging: US Renal  Result Date: 08/09/2019 CLINICAL DATA:  Acute on chronic kidney disease EXAM: RENAL / URINARY TRACT ULTRASOUND COMPLETE COMPARISON:  06/01/2013 FINDINGS: Right Kidney: Renal measurements: 9.1 x 4.2 x 2.8 cm = volume: 57 mL. Mild renal cortical atrophy. Increased renal cortical echogenicity. No mass or hydronephrosis visualized. Left Kidney: Renal measurements: 9.0 x 5.1 x 3.3 = volume: 80 mL. Mild renal cortical atrophy. Increased renal cortical echogenicity. No mass or hydronephrosis visualized. Bladder: Appears normal for degree of bladder distention. IMPRESSION: 1. No evidence of obstructive uropathy. 2. Echogenic kidneys suggesting sequela of chronic medical renal disease. Electronically Signed   By: Davina Poke M.D.   On: 08/09/2019 17:53    Labs: BMET Recent Labs  Lab 08/09/19 1224 08/10/19 0535 08/11/19 0332  NA 139 142 140  K 5.4* 4.2 4.3  CL 107 109 108  CO2 22 22 21*  GLUCOSE 207* 183* 247*  BUN 84* 82* 84*  CREATININE 4.09* 4.50* 4.00*  CALCIUM 9.3 9.2 8.6*  PHOS 5.1*  --  4.1   CBC Recent Labs  Lab 08/09/19 1224  08/10/19 0535 08/11/19 0332  WBC 5.1 4.9 4.9  NEUTROABS 3.8  --   --   HGB 9.7* 8.2* 7.5*  HCT 29.2* 24.3* 23.4*  MCV 99.3 98.8 99.6  PLT 157 131* 125*    Medications:    . atorvastatin  80 mg Oral q1800  . buPROPion  300 mg Oral Daily  . carvedilol  25 mg Oral Q12H  . clopidogrel  75 mg Oral Daily  . enoxaparin (LOVENOX) injection  30 mg Subcutaneous Q24H  . ferrous sulfate  325 mg Oral Q breakfast  . insulin aspart  0-9 Units Subcutaneous TID WC      Otelia Santee, MD 08/11/2019, 3:36 PM

## 2019-08-12 ENCOUNTER — Inpatient Hospital Stay (HOSPITAL_COMMUNITY): Payer: Medicare Other | Admitting: Certified Registered Nurse Anesthetist

## 2019-08-12 ENCOUNTER — Encounter (HOSPITAL_COMMUNITY)
Admission: EM | Disposition: A | Discharge status: Home / Self Care | Payer: Self-pay | Source: Home / Self Care | Attending: Internal Medicine

## 2019-08-12 ENCOUNTER — Encounter (HOSPITAL_COMMUNITY): Payer: Self-pay | Admitting: Orthopedic Surgery

## 2019-08-12 DIAGNOSIS — N184 Chronic kidney disease, stage 4 (severe): Secondary | ICD-10-CM

## 2019-08-12 HISTORY — PX: AV FISTULA PLACEMENT: SHX1204

## 2019-08-12 LAB — BASIC METABOLIC PANEL
Anion gap: 9 (ref 5–15)
BUN: 77 mg/dL — ABNORMAL HIGH (ref 8–23)
CO2: 20 mmol/L — ABNORMAL LOW (ref 22–32)
Calcium: 9 mg/dL (ref 8.9–10.3)
Chloride: 113 mmol/L — ABNORMAL HIGH (ref 98–111)
Creatinine, Ser: 3.72 mg/dL — ABNORMAL HIGH (ref 0.44–1.00)
GFR calc Af Amer: 14 mL/min — ABNORMAL LOW (ref >60)
GFR calc non Af Amer: 12 mL/min — ABNORMAL LOW (ref >60)
Glucose, Bld: 144 mg/dL — ABNORMAL HIGH (ref 70–99)
Potassium: 4 mmol/L (ref 3.5–5.1)
Sodium: 142 mmol/L (ref 135–145)

## 2019-08-12 LAB — GLUCOSE, CAPILLARY
Glucose-Capillary: 148 mg/dL — ABNORMAL HIGH (ref 70–99)
Glucose-Capillary: 124 mg/dL — ABNORMAL HIGH (ref 70–99)
Glucose-Capillary: 131 mg/dL — ABNORMAL HIGH (ref 70–99)
Glucose-Capillary: 158 mg/dL — ABNORMAL HIGH (ref 70–99)
Glucose-Capillary: 274 mg/dL — ABNORMAL HIGH (ref 70–99)

## 2019-08-12 LAB — CBC
HCT: 24.1 % — ABNORMAL LOW (ref 36.0–46.0)
Hemoglobin: 7.8 g/dL — ABNORMAL LOW (ref 12.0–15.0)
MCH: 32.1 pg (ref 26.0–34.0)
MCHC: 32.4 g/dL (ref 30.0–36.0)
MCV: 99.2 fL (ref 80.0–100.0)
Platelets: 126 10*3/uL — ABNORMAL LOW (ref 150–400)
RBC: 2.43 MIL/uL — ABNORMAL LOW (ref 3.87–5.11)
RDW: 13.9 % (ref 11.5–15.5)
WBC: 5.6 10*3/uL (ref 4.0–10.5)
nRBC: 0 % (ref 0.0–0.2)

## 2019-08-12 SURGERY — ARTERIOVENOUS (AV) FISTULA CREATION
Anesthesia: Monitor Anesthesia Care | Laterality: Right

## 2019-08-12 MED ORDER — FENTANYL CITRATE (PF) 250 MCG/5ML IJ SOLN
INTRAMUSCULAR | Status: AC
  Filled 2019-08-12: qty 5

## 2019-08-12 MED ORDER — MIDAZOLAM HCL 2 MG/2ML IJ SOLN
INTRAMUSCULAR | Status: AC
  Filled 2019-08-12: qty 2

## 2019-08-12 MED ORDER — SODIUM CHLORIDE 0.9 % IV SOLN
INTRAVENOUS | Status: DC
  Administered 2019-08-12 (×2): via INTRAVENOUS

## 2019-08-12 MED ORDER — PROTAMINE SULFATE 10 MG/ML IV SOLN
INTRAVENOUS | Status: DC | PRN
  Administered 2019-08-12 (×2): 10 mg via INTRAVENOUS

## 2019-08-12 MED ORDER — ONDANSETRON HCL 4 MG/2ML IJ SOLN: 4.0000 mg | Freq: Once | INTRAMUSCULAR | Status: DC | PRN

## 2019-08-12 MED ORDER — 0.9 % SODIUM CHLORIDE (POUR BTL) OPTIME
TOPICAL | Status: DC | PRN
  Administered 2019-08-12: 1000 mL

## 2019-08-12 MED ORDER — FENTANYL CITRATE (PF) 250 MCG/5ML IJ SOLN
INTRAMUSCULAR | Status: DC | PRN
  Administered 2019-08-12 (×2): 12.5 ug via INTRAVENOUS

## 2019-08-12 MED ORDER — HEPARIN SODIUM (PORCINE) 1000 UNIT/ML IJ SOLN
INTRAMUSCULAR | Status: DC | PRN
  Administered 2019-08-12: 5000 [IU] via INTRAVENOUS

## 2019-08-12 MED ORDER — SODIUM CHLORIDE 0.9 % IV SOLN
INTRAVENOUS | Status: AC
  Filled 2019-08-12: qty 1.2

## 2019-08-12 MED ORDER — PROPOFOL 500 MG/50ML IV EMUL
INTRAVENOUS | Status: DC | PRN
  Administered 2019-08-12: 50 ug/kg/min via INTRAVENOUS

## 2019-08-12 MED ORDER — PROPOFOL 10 MG/ML IV BOLUS
INTRAVENOUS | Status: AC
  Filled 2019-08-12: qty 20

## 2019-08-12 MED ORDER — CEFAZOLIN SODIUM 1 G IJ SOLR
INTRAMUSCULAR | Status: AC
  Filled 2019-08-12: qty 20

## 2019-08-12 MED ORDER — PAPAVERINE HCL 30 MG/ML IJ SOLN
INTRAMUSCULAR | Status: AC
  Filled 2019-08-12: qty 2

## 2019-08-12 MED ORDER — DEXAMETHASONE SODIUM PHOSPHATE 10 MG/ML IJ SOLN
INTRAMUSCULAR | Status: DC | PRN
  Administered 2019-08-12: 5 mg via INTRAVENOUS

## 2019-08-12 MED ORDER — SODIUM CHLORIDE 0.9 % IV SOLN
INTRAVENOUS | Status: DC | PRN
  Administered 2019-08-12: 13:00:00 15 ug/min via INTRAVENOUS

## 2019-08-12 MED ORDER — PHENYLEPHRINE HCL (PRESSORS) 10 MG/ML IV SOLN
INTRAVENOUS | Status: DC | PRN
  Administered 2019-08-12 (×2): 120 ug via INTRAVENOUS

## 2019-08-12 MED ORDER — LIDOCAINE HCL (PF) 1 % IJ SOLN
INTRAMUSCULAR | Status: AC
  Filled 2019-08-12: qty 30

## 2019-08-12 MED ORDER — ONDANSETRON HCL 4 MG/2ML IJ SOLN
INTRAMUSCULAR | Status: DC | PRN
  Administered 2019-08-12: 4 mg via INTRAVENOUS

## 2019-08-12 MED ORDER — ENOXAPARIN SODIUM 30 MG/0.3ML ~~LOC~~ SOLN
30.0000 mg | SUBCUTANEOUS | Status: DC
  Administered 2019-08-13: 08:00:00 30 mg via SUBCUTANEOUS
  Filled 2019-08-12: qty 0.3

## 2019-08-12 MED ORDER — ONDANSETRON HCL 4 MG/2ML IJ SOLN
INTRAMUSCULAR | Status: AC
  Filled 2019-08-12: qty 2

## 2019-08-12 MED ORDER — LIDOCAINE HCL 1 % IJ SOLN
INTRAMUSCULAR | Status: DC | PRN
  Administered 2019-08-12: 6 mL

## 2019-08-12 MED ORDER — DEXAMETHASONE SODIUM PHOSPHATE 10 MG/ML IJ SOLN
INTRAMUSCULAR | Status: AC
  Filled 2019-08-12: qty 1

## 2019-08-12 MED ORDER — SODIUM CHLORIDE 0.9 % IV SOLN: INTRAVENOUS | Status: DC

## 2019-08-12 MED ORDER — MIDAZOLAM HCL 2 MG/2ML IJ SOLN
INTRAMUSCULAR | Status: DC | PRN
  Administered 2019-08-12: 2 mg via INTRAVENOUS

## 2019-08-12 MED ORDER — FENTANYL CITRATE (PF) 100 MCG/2ML IJ SOLN: 25.0000 ug | INTRAMUSCULAR | Status: DC | PRN

## 2019-08-12 MED ORDER — HYDROCODONE-ACETAMINOPHEN 5-325 MG PO TABS
1.0000 | ORAL_TABLET | ORAL | Status: DC | PRN
  Administered 2019-08-12 – 2019-08-13 (×3): 1 via ORAL
  Filled 2019-08-12 (×2): qty 1
  Filled 2019-08-12: qty 2

## 2019-08-12 MED ORDER — LIDOCAINE 2% (20 MG/ML) 5 ML SYRINGE
INTRAMUSCULAR | Status: AC
  Filled 2019-08-12: qty 5

## 2019-08-12 MED ORDER — SODIUM CHLORIDE 0.9 % IV SOLN
INTRAVENOUS | Status: DC | PRN
  Administered 2019-08-12: 500 mL

## 2019-08-12 SURGICAL SUPPLY — 38 items
ADH SKN CLS APL DERMABOND .7 (GAUZE/BANDAGES/DRESSINGS) ×1
ADH SKN CLS LQ APL DERMABOND (GAUZE/BANDAGES/DRESSINGS) ×1
AGENT HMST SPONGE THK3/8 (HEMOSTASIS)
ARMBAND PINK RESTRICT EXTREMIT (MISCELLANEOUS) ×6 IMPLANT
CANISTER SUCT 3000ML PPV (MISCELLANEOUS) ×3 IMPLANT
CLIP VESOCCLUDE MED 6/CT (CLIP) ×3 IMPLANT
CLIP VESOCCLUDE SM WIDE 6/CT (CLIP) ×3 IMPLANT
COVER PROBE W GEL 5X96 (DRAPES) ×3 IMPLANT
COVER WAND RF STERILE (DRAPES) ×3 IMPLANT
DECANTER SPIKE VIAL GLASS SM (MISCELLANEOUS) ×3 IMPLANT
DERMABOND ADHESIVE PROPEN (GAUZE/BANDAGES/DRESSINGS) ×2
DERMABOND ADVANCED (GAUZE/BANDAGES/DRESSINGS) ×2
DERMABOND ADVANCED .7 DNX12 (GAUZE/BANDAGES/DRESSINGS) ×1 IMPLANT
DERMABOND ADVANCED .7 DNX6 (GAUZE/BANDAGES/DRESSINGS) IMPLANT
ELECT REM PT RETURN 9FT ADLT (ELECTROSURGICAL) ×3
ELECTRODE REM PT RTRN 9FT ADLT (ELECTROSURGICAL) ×1 IMPLANT
GLOVE BIO SURGEON STRL SZ7.5 (GLOVE) ×3 IMPLANT
GLOVE BIOGEL PI IND STRL 8 (GLOVE) ×1 IMPLANT
GLOVE BIOGEL PI INDICATOR 8 (GLOVE) ×2
GOWN STRL REUS W/ TWL LRG LVL3 (GOWN DISPOSABLE) ×2 IMPLANT
GOWN STRL REUS W/ TWL XL LVL3 (GOWN DISPOSABLE) ×2 IMPLANT
GOWN STRL REUS W/TWL LRG LVL3 (GOWN DISPOSABLE) ×6
GOWN STRL REUS W/TWL XL LVL3 (GOWN DISPOSABLE) ×6
HEMOSTAT SPONGE AVITENE ULTRA (HEMOSTASIS) IMPLANT
HEMOSTAT SURGICEL 2X14 (HEMOSTASIS) ×2 IMPLANT
KIT BASIN OR (CUSTOM PROCEDURE TRAY) ×3 IMPLANT
KIT TURNOVER KIT B (KITS) ×3 IMPLANT
NS IRRIG 1000ML POUR BTL (IV SOLUTION) ×3 IMPLANT
PACK CV ACCESS (CUSTOM PROCEDURE TRAY) ×3 IMPLANT
PAD ARMBOARD 7.5X6 YLW CONV (MISCELLANEOUS) ×6 IMPLANT
SUT MNCRL AB 4-0 PS2 18 (SUTURE) ×3 IMPLANT
SUT PROLENE 6 0 BV (SUTURE) ×3 IMPLANT
SUT PROLENE 7 0 BV 1 (SUTURE) IMPLANT
SUT VIC AB 3-0 SH 27 (SUTURE) ×3
SUT VIC AB 3-0 SH 27X BRD (SUTURE) ×1 IMPLANT
TOWEL GREEN STERILE (TOWEL DISPOSABLE) ×3 IMPLANT
UNDERPAD 30X30 (UNDERPADS AND DIAPERS) ×3 IMPLANT
WATER STERILE IRR 1000ML POUR (IV SOLUTION) ×3 IMPLANT

## 2019-08-12 NOTE — Transfer of Care (Signed)
Immediate Anesthesia Transfer of Care Note  Patient: [name]  Procedure(s) Performed: ARTERIOVENOUS (AV) FISTULA CREATION (Right )  Patient Location: PACU  Anesthesia Type:MAC  Level of Consciousness: awake and patient cooperative  Airway & Oxygen Therapy: Patient Spontanous Breathing  Post-op Assessment: Report given to RN and Post -op Vital signs reviewed and stable  Post vital signs: Reviewed and stable  Last Vitals:  Vitals Value Taken Time  BP 110/98 08/12/19 1438  Temp    Pulse 75 08/12/19 1439  Resp 17 08/12/19 1439  SpO2 96 % 08/12/19 1439  Vitals shown include unvalidated device data.  Last Pain:  Vitals:   08/12/19 0730  TempSrc:   PainSc: 0-No pain      Patients Stated Pain Goal: 0 (87/68/11 5726)  Complications: No apparent anesthesia complications

## 2019-08-12 NOTE — Progress Notes (Signed)
PT Cancellation Note  Patient Details Name: Laurieanne Galloway MRN: [id number] DOB: June 14, 1951   Cancelled Treatment:    Reason Eval/Treat Not Completed: (P) Patient at procedure or test/unavailable Pt off floor for procedure. PT will follow back this afternoon as able.  Art Levan B. Migdalia Dk PT, DPT Acute Rehabilitation Services Pager [phone] Office [phone]    [place] 08/12/2019, 12:01 PM

## 2019-08-12 NOTE — Anesthesia Postprocedure Evaluation (Signed)
Anesthesia Post Note  Patient: [name]  Procedure(s) Performed: ARTERIOVENOUS (AV) FISTULA CREATION (Right )     Patient location during evaluation: PACU Anesthesia Type: MAC Level of consciousness: awake and alert and oriented Pain management: pain level controlled Vital Signs Assessment: post-procedure vital signs reviewed and stable Respiratory status: spontaneous breathing, nonlabored ventilation and respiratory function stable Cardiovascular status: stable and blood pressure returned to baseline Postop Assessment: no apparent nausea or vomiting Anesthetic complications: no    Last Vitals:  Vitals:   08/12/19 0506 08/12/19 1058  BP: (!) 148/69 (!) 184/60  Pulse: 78 80  Resp: 18   Temp: 36.8 C   SpO2: (!) 89%     Last Pain:  Vitals:   08/12/19 0730  TempSrc:   PainSc: 0-No pain                 [name]

## 2019-08-12 NOTE — Anesthesia Procedure Notes (Signed)
Procedure Name: MAC Date/Time: 08/12/2019 12:58 PM Performed by: Kathryne Hitch, CRNA Pre-anesthesia Checklist: Patient identified, Emergency Drugs available, Suction available and Patient being monitored Patient Re-evaluated:Patient Re-evaluated prior to induction Oxygen Delivery Method: Simple face mask Preoxygenation: Pre-oxygenation with 100% oxygen Induction Type: IV induction Dental Injury: Teeth and Oropharynx as per pre-operative assessment

## 2019-08-12 NOTE — Progress Notes (Signed)
OT Cancellation Note  Patient Details Name: [name] MRN: [id number] DOB: 08/14/1951   Cancelled Treatment:    Reason Eval/Treat Not Completed: Patient at procedure or test/ unavailable pt off floor for procedure. OT will return later as time allows and pt is appropriate.   Dorinda Hill OTR/L Acute Rehabilitation Services Office: [place] 08/12/2019, 11:12 AM

## 2019-08-12 NOTE — Discharge Instructions (Addendum)
Correction coverage: Sensitive (thin, NPO, renal)  CBG < 70: Implement Hypoglycemia Standing Orders and refer to Hypoglycemia Standing Orders sidebar report  CBG 70 - 120: 0 units  CBG 121 - 150: 1 unit  CBG 151 - 200: 2 units  CBG 201 - 250: 3 units  CBG 251 - 300: 5 units  CBG 301 - 350: 7 units  CBG 351 - 400 9 units  CBG > 400 call MD and obtain STAT lab verification      [facility]  Discharge Instructions  AV Fistula or Graft Surgery for Dialysis Access  Please refer to the following instructions for your post-procedure care. Your surgeon or physician assistant will discuss any changes with you.  Activity  You may drive the day following your surgery, if you are comfortable and no longer taking prescription pain medication. Resume full activity as the soreness in your incision resolves.  Bathing/Showering  You may shower after you go home. Keep your incision dry for 48 hours. Do not soak in a bathtub, hot tub, or swim until the incision heals completely. You may not shower if you have a hemodialysis catheter.  Incision Care  Clean your incision with mild soap and water after 48 hours. Pat the area dry with a clean towel. You do not need a bandage unless otherwise instructed. Do not apply any ointments or creams to your incision. You may have skin glue on your incision. Do not peel it off. It will come off on its own in about one week. Your arm may swell a bit after surgery. To reduce swelling use pillows to elevate your arm so it is above your heart. Your doctor will tell you if you need to lightly wrap your arm with an ACE bandage.  Diet  Resume your normal diet. There are not special food restrictions following this procedure. In order to heal from your surgery, it is CRITICAL to get adequate nutrition. Your body requires vitamins, minerals, and protein. Vegetables are the best source of vitamins and minerals. Vegetables also provide  the perfect balance of protein. Processed food has little nutritional value, so try to avoid this.  Medications  Resume taking all of your medications. If your incision is causing pain, you may take over-the counter pain relievers such as acetaminophen (Tylenol). If you were prescribed a stronger pain medication, please be aware these medications can cause nausea and constipation. Prevent nausea by taking the medication with a snack or meal. Avoid constipation by drinking plenty of fluids and eating foods with high amount of fiber, such as fruits, vegetables, and grains. Do not take Tylenol if you are taking prescription pain medications.     Follow up Your surgeon may want to see you in the office following your access surgery. If so, this will be arranged at the time of your surgery.  Please call us immediately for any of the following conditions:  Increased pain, redness, drainage (pus) from your incision site Fever of 101 degrees or higher Severe or worsening pain at your incision site Hand pain or numbness.  Reduce your risk of vascular disease:  Stop smoking. If you would like help, call [facility] at 1-800-QUIT-NOW (763) 001-5912) or [place] [facility] [place] your cholesterol Maintain a desired weight Control your diabetes Keep your blood pressure down  Dialysis  It will take several weeks to several months for your new dialysis access to be ready for use. Your surgeon will determine when  it is OK to use it. Your nephrologist will continue to direct your dialysis. You can continue to use your Permcath until your new access is ready for use.  If you have any questions, please call the office at [phone].     Food Basics for Chronic Kidney Disease When your kidneys are not working well, they cannot remove waste and excess substances from your blood as effectively as they did before. This can lead to a buildup and imbalance of these substances, which can  worsen kidney damage and affect how your body functions. Certain foods lead to a buildup of these substances in the body. By changing your diet as recommended by your diet and nutrition specialist (dietitian) or health care provider, you could help prevent further kidney damage and delay or prevent the need for dialysis. What are tips for following this plan? General instructions   Work with your health care provider and dietitian to develop a meal plan that is right for you. Foods you can eat, limit, or avoid will be different for each person depending on the stage of kidney disease and any other existing health conditions.  Talk with your health care provider about whether you should take a vitamin and mineral supplement.  Use standard measuring cups and spoons to measure servings of foods. Use a kitchen scale to measure portions of protein foods.  If directed by your health care provider, avoid drinking too much fluid. Measure and count all liquids, including water, ice, soups, flavored gelatin, and frozen desserts such as popsicles or ice cream. Reading food labels  Check the amount of sodium in foods. Choose foods that have less than 300 milligrams (mg) per serving.  Check the ingredient list for phosphorus or potassium-based additives or preservatives.  Check the amount of saturated and trans fat. Limit or avoid these fats as told by your dietitian. Shopping  Avoid buying foods that are: ? Processed, frozen, or prepackaged. ? Calcium-enriched or fortified.  Do not buy foods that have salt or sodium listed among the first five ingredients.  Do not buy canned vegetables. Cooking  Replace animal proteins, such as meat, fish, eggs, or dairy, with plant proteins from beans, nuts, and soy. ? Use soy milk instead of cow's milk. ? Add beans or tofu to soups, casseroles, or pasta dishes instead of meat.  Soak vegetables, such as potatoes, before cooking to reduce potassium. To do  this: ? Peel and cut into small pieces. ? Soak in warm water for at least 2 hours. For every 1 cup of vegetables, use 10 cups of water. ? Drain and rinse with warm water. ? Boil for at least 5 minutes. Meal planning  Limit the amount of protein from plant and animal sources you eat each day.  Do not add salt to food when cooking or before eating.  Eat meals and snacks at around the same time each day. If you have diabetes:  If you have diabetes (diabetes mellitus) and chronic kidney disease, it is important to keep your blood glucose in the target range recommended by your health care provider. Follow your diabetes management plan. This may include: ? Checking your blood glucose regularly. ? Taking oral medicines, insulin, or both. ? Exercising for at least 30 minutes on 5 or more days each week, or as told by your health care provider. ? Tracking how many servings of carbohydrates you eat at each meal.  You may be given specific guidelines on how much of certain foods  and nutrients you may eat, depending on your stage of kidney disease and whether you have high blood pressure (hypertension). Follow your meal plan as told by your dietitian. What nutrients should be limited? The items listed are not a complete list. Talk with your dietitian about what dietary choices are best for you. Potassium Potassium affects how steadily your heart beats. If too much potassium builds up in your blood, it can cause an irregular heartbeat or even a heart attack. You may need to eat less potassium, depending on your blood potassium levels and the stage of kidney disease. Talk to your dietitian about how much potassium you may have each day. You may need to limit or avoid foods that are high in potassium, such as:  Milk and soy milk.  Fruits, such as bananas, papaya, apricots, nectarines, melon, prunes, raisins, kiwi, and oranges.  Vegetables, such as potatoes, sweet potatoes, yams, tomatoes, leafy  greens, beets, okra, avocado, pumpkin, and winter squash.  White and lima beans. Phosphorus Phosphorus is a mineral found in your bones. A balance between calcium and phosphorous is needed to build and maintain healthy bones. Too much phosphorus pulls calcium from your bones. This can make your bones weak and more likely to break. Too much phosphorus can also make your skin itch. You may need to eat less phosphorus depending on your blood phosphorus levels and the stage of kidney disease. Talk to your dietitian about how much potassium you may have each day. You may need to take medicine to lower your blood phosphorus levels if diet changes do not help. You may need to limit or avoid foods that are high in phosphorus, such as:  Milk and dairy products.  Dried beans and peas.  Tofu, soy milk, and other soy-based meat replacements.  Colas.  Nuts and peanut butter.  Meat, poultry, and fish.  Bran cereals and oatmeals. Protein Protein helps you to make and keep muscle. It also helps in the repair of your bodys cells and tissues. One of the natural breakdown products of protein is a waste product called urea. When your kidneys are not working properly, they cannot remove wastes, such as urea, like they did before you developed chronic kidney disease. Reducing how much protein you eat can help prevent a buildup of urea in your blood. Depending on your stage of kidney disease, you may need to limit foods that are high in protein. Sources of animal protein include:  Meat (all types).  Fish and seafood.  Poultry.  Eggs.  Dairy. Other protein foods include:  Beans and legumes.  Nuts and nut butter.  Soy and tofu. Sodium Sodium, which is found in salt, helps maintain a healthy balance of fluids in your body. Too much sodium can increase your blood pressure and have a negative effect on the function of your heart and lungs. Too much sodium can also cause your body to retain too much  fluid, making your kidneys work harder. Most people should have less than 2,300 milligrams (mg) of sodium each day. If you have hypertension, you may need to limit your sodium to 1,500 mg each day. Talk to your dietitian about how much sodium you may have each day. You may need to limit or avoid foods that are high in sodium, such as:  Salt seasonings.  Soy sauce.  Cured and processed meats.  Salted crackers and snack foods.  Fast food.  Canned soups and most canned foods.  Pickled foods.  Vegetable juice.  Boxed mixes or ready-to-eat boxed meals and side dishes.  Bottled dressings, sauces, and marinades. Summary  Chronic kidney disease can lead to a buildup and imbalance of waste and excess substances in the body. Certain foods lead to a buildup of these substances. By adjusting your intake of these foods, you could help prevent more kidney damage and delay or prevent the need for dialysis.  Food adjustments are different for each person with chronic kidney disease. Work with a dietitian to set up nutrient goals and a meal plan that is right for you.  If you have diabetes and chronic kidney disease, it is important to keep your blood glucose in the target range recommended by your health care provider. This information is not intended to replace advice given to you by your health care provider. Make sure you discuss any questions you have with your health care provider. Document Released: 01/26/2003 Document Revised: 02/26/2019 Document Reviewed: 10/31/2016 Elsevier Patient Education  2020 [employer].

## 2019-08-12 NOTE — Progress Notes (Signed)
PT Cancellation Note  Patient Details Name: [name] MRN: [id number] DOB: [birth year]   Cancelled Treatment:    Reason Eval/Treat Not Completed: (P) Pain limiting ability to participate Pt just returned from OR complaining of arm pain. RN request therapy tomorrow.  Alexiss Iturralde B. Migdalia Dk PT, DPT Acute Rehabilitation Services Pager [phone] Office [phone]  [place] 08/12/2019, 4:38 PM

## 2019-08-12 NOTE — Op Note (Signed)
OPERATIVE NOTE   PROCEDURE: right brachiocephalic arteriovenous fistula placement  PRE-OPERATIVE DIAGNOSIS: chronic kidney disease stage IV/V  POST-OPERATIVE DIAGNOSIS: same as above   SURGEON: Marty Heck, MD  ASSISTANT(S): Arlee Muslim, PA  ANESTHESIA: MAC  ESTIMATED BLOOD LOSS: 100 cc  FINDING(S): 1.  Cephalic vein: 4 mm, acceptable 2.  Brachial artery: 4 mm, atherosclerotic disease evident 3.  Venous outflow: palpable thrill  4.  Radial flow: dopplerable radial signal  SPECIMEN(S):  none  INDICATIONS:   [name] is a 68 y.o. female who presents with stage IV/V CKD and need for permanent hemodialysis access.  The patient is scheduled for right brachiocephalic arteriovenous fistula placement.  The patient is aware the risks include but are not limited to: bleeding, infection, steal syndrome, nerve damage, ischemic monomelic neuropathy, failure to mature, and need for additional procedures.  The patient is aware of the risks of the procedure and elects to proceed forward.  DESCRIPTION: After full informed written consent was obtained from the patient, the patient was brought back to the operating room and placed supine upon the operating table.  Prior to induction, the patient received IV antibiotics.   After obtaining adequate anesthesia, the patient was then prepped and draped in the standard fashion for a right arm access procedure.  I turned my attention first to identifying the patient's cephalic vein and brachial artery.  Using SonoSite guidance, the location of these vessels were marked out on the skin just below the elbow.     At this point, I injected local anesthetic to obtain a field block of the antecubitum.  In total, I injected about 8 mL of 1% lidocaine without epinephrine.  I made a transverse incision just below the level of the antecubitum and dissected through the subcutaneous tissue and fascia to gain exposure of the brachial artery.  This was  noted to be 4 mm in diameter externally.  This was dissected out proximally and distally and controlled with vessel loops .  I then dissected out the cephalic vein.  This was noted to be 4 mm in diameter externally.  The distal segment of the vein was ligated with a  2-0 silk, and the vein was transected.  The proximal segment was interrogated with serial dilators.  I initially tried to pass a 4 mm dilator and it would not pass with a lot of resistance.  I kept downsizing until I could get a 2 mm dilator to pass.  I continue to upsize the dilator with some resistance until I got a 3.5 mm dilator to pass with some resistance.  I then instilled the heparinized saline into the vein and clamped it.  At this point, I reset my exposure of the brachial artery and placed the artery under tension proximally and distally.  I made an arteriotomy with a #11 blade, and then I extended the arteriotomy with a Potts scissor.  I injected heparinized saline proximal and distal to this arteriotomy.  The vein was then sewn to the artery in an end-to-side configuration with a running stitch of 6-0 Prolene.  Prior to completing this anastomosis, I allowed the vein and artery to backbleed.  There was no evidence of clot from any vessels.  I completed the anastomosis in the usual fashion and then released all vessel loops and clamps.    There was a palpable thrill in the venous outflow, and there was a dopplerable radial signal.  At this point, I irrigated out the surgical wound.  Unfortunately there was bleeding from the area where we had difficulty getting dilators to pass.  I suspected that we had avulsed part of the vein wall here from passing dilators given a stenosis and I could only get a #2 dilator to pass initially.  Ultimately had to extend my incision slightly and then using Metzenbaum scissors further mobilized the cephalic vein in the upper arm.  Ultimately identified a tear on the posterior wall of the vein.  This was  repaired with several 6-0 Prolenes in figure-of-eight fashion until I could get hemostasis.  There was still a great thrill.  The wound was copiously irrigated.  I put some Surgicel around the area where I had to repair the vein posteriorly.  The subcutaneous tissue was reapproximated with a running stitch of 3-0 Vicryl.  The skin was then reapproximated with a running subcuticular stitch of 4-0 Monocryl.  The skin was then cleaned, dried, and reinforced with Dermabond.  The patient tolerated this procedure well.   COMPLICATIONS: None  CONDITION: Stable   Marty Heck, MD [facility] [place] Office: [phone] Pager: [phone]  08/12/2019, 2:24 PM

## 2019-08-12 NOTE — Progress Notes (Signed)
[name] Progress Note   68 y.o.year-old with hx of PAD sp L BKA, CKD IV, CVA (2014), DM2, anemia presented to ED brought by EMS for hypoglycemia episode. Pt lives w/ her daughter at home and uses a WC. No tobacco/ etoh use. In ED pt was hypothermic and was therefore admitted. rec'd IV fluid bolus 500 cc, Rocephin x 1. K 5.4, creat 4.0. Assessment/ Plan:    1. CKD stage 4/5 - has had some vomiting which could have contributed to low BS's. Poor historian so not sure if this is acute or chronic vomiting issues. Could be uremic but no gross exam findings to support. She may be slightly dry, hold lasix and give gentle IVF's. Will get records from office in am. Patient use to follow w/ Dr Moshe Cipro. She has an AVF in L arm but is occluded (put in 2016). - Appreciate  VVS seeing the pt; Dr. Carlis Abbott to place an access this AM..  - She doesn't have any absolute indications for dialysis but may be starting to exhibit some uremic sxs; would like to avoid initiating HD through a catheter. Likely to initiate HD in under 6 mths. She should be able to go home after surgery.  - F/U with [facility] in 2-4 weeks  2. Hypoglycemia - per primary, is on insulin 2 types  3. PAD sp L BKA 4. HTN - BP's 150/70, on coreg and lasix at home, continue 5. Hyperkalemia - renal diet, lokelma PRN 6. DM2 - per primary  Subjective:   Denies f/c/n/v.   Objective:   BP (!) 148/69 (BP Location: Left Arm)   Pulse 78   Temp 98.3 F (36.8 C) (Oral)   Resp 18   Wt 94.8 kg   SpO2 (!) 89%   BMI 38.23 kg/m   Intake/Output Summary (Last 24 hours) at 08/12/2019 1015 Last data filed at 08/12/2019 6629 Gross per 24 hour  Intake -  Output 600 ml  Net -600 ml   Weight change: -1.8 kg  Physical Exam: Genmorbidly obese, pleasant, lying flat, no distress No rash, cyanosis or gangrene Sclera anicteric, throat clear  No jvd or bruits Chest clear bilatto bases RRR no  MRG Abd soft ntnd no mass or ascites +bsobese ExtL stump and RLE w/o anyedema, no wounds, skin turgor poor in LE's Neuro is alert, Ox2 Access: no bruit in the left arm  Imaging: Vas [place] Upper Ext Vein Mapping (pre-op Avf)  Result Date: 08/11/2019 UPPER EXTREMITY VEIN MAPPING  Indications: Pre-access. Performing Technologist: June Leap RDMS, RVT  Examination Guidelines: A complete evaluation includes B-mode imaging, spectral Doppler, color Doppler, and power Doppler as needed of all accessible portions of each vessel. Bilateral testing is considered an integral part of a complete examination. Limited examinations for reoccurring indications may be performed as noted. +-----------------+-------------+----------+---------+ Right Cephalic   Diameter (cm)Depth (cm)Findings  +-----------------+-------------+----------+---------+ Shoulder             0.33        1.22             +-----------------+-------------+----------+---------+ Mid upper arm        0.34        0.50             +-----------------+-------------+----------+---------+ Dist upper arm       0.34        0.92   tortuous  +-----------------+-------------+----------+---------+ Antecubital fossa    0.36                         +-----------------+-------------+----------+---------+  Prox forearm         0.20               branching +-----------------+-------------+----------+---------+ Mid forearm          0.20                         +-----------------+-------------+----------+---------+ +-----------------+-------------+----------+---------+ Right Basilic    Diameter (cm)Depth (cm)Findings  +-----------------+-------------+----------+---------+ Mid upper arm        0.36        1.93             +-----------------+-------------+----------+---------+ Dist upper arm       0.36        1.80   branching +-----------------+-------------+----------+---------+ Antecubital fossa    0.36                          +-----------------+-------------+----------+---------+ +-----------------+-------------+----------+---------+ Left Basilic     Diameter (cm)Depth (cm)Findings  +-----------------+-------------+----------+---------+ Mid upper arm        0.43        2.50             +-----------------+-------------+----------+---------+ Dist upper arm       0.37        1.72             +-----------------+-------------+----------+---------+ Antecubital fossa    0.26        1.40   branching +-----------------+-------------+----------+---------+ Previous AVF left arm. *See table(s) above for measurements and observations.  Diagnosing physician: Deitra Mayo MD Electronically signed by Deitra Mayo MD on 08/11/2019 at 4:14:07 PM.    Final     Labs: BMET Recent Labs  Lab 08/09/19 1224 08/10/19 0535 08/11/19 0332 08/12/19 0318  NA 139 142 140 142  K 5.4* 4.2 4.3 4.0  CL 107 109 108 113*  CO2 22 22 21* 20*  GLUCOSE 207* 183* 247* 144*  BUN 84* 82* 84* 77*  CREATININE 4.09* 4.50* 4.00* 3.72*  CALCIUM 9.3 9.2 8.6* 9.0  PHOS 5.1*  --  4.1  --    CBC Recent Labs  Lab 08/09/19 1224 08/10/19 0535 08/11/19 0332 08/12/19 0318  WBC 5.1 4.9 4.9 5.6  NEUTROABS 3.8  --   --   --   HGB 9.7* 8.2* 7.5* 7.8*  HCT 29.2* 24.3* 23.4* 24.1*  MCV 99.3 98.8 99.6 99.2  PLT 157 131* 125* 126*    Medications:    . atorvastatin  80 mg Oral q1800  . buPROPion  300 mg Oral Daily  . carvedilol  25 mg Oral Q12H  . clopidogrel  75 mg Oral Daily  . enoxaparin (LOVENOX) injection  30 mg Subcutaneous Q24H  . ferrous sulfate  325 mg Oral Q breakfast  . insulin aspart  0-9 Units Subcutaneous TID WC      Otelia Santee, MD 08/12/2019, 10:15 AM

## 2019-08-12 NOTE — Progress Notes (Signed)
[facility] [place]  Subjective  - No complaints  Objective (!) 184/60 80 98.3 F (36.8 C) (Oral) 18 (!) 89%  Intake/Output Summary (Last 24 hours) at 08/12/2019 1157 Last data filed at 08/12/2019 0528 Gross per 24 hour  Intake -  Output 600 ml  Net -600 ml    Right brachial 2+ palpable Right radial 1+ palpable  Laboratory Lab Results: Recent Labs    08/11/19 0332 08/12/19 0318  WBC 4.9 5.6  HGB 7.5* 7.8*  HCT 23.4* 24.1*  PLT 125* 126*   BMET Recent Labs    08/11/19 0332 08/12/19 0318  NA 140 142  K 4.3 4.0  CL 108 113*  CO2 21* 20*  GLUCOSE 247* 144*  BUN 84* 77*  CREATININE 4.00* 3.72*  CALCIUM 8.6* 9.0    COAG Lab Results  Component Value Date   INR 1.1 08/09/2019   INR 1.03 12/19/2016   INR 1.09 03/06/2015   No results found for: PTT  Assessment/Planning:  Plan for right arm AVF.  Risks and benefits discussed. Consent signed.  Marty Heck 08/12/2019 11:57 AM --

## 2019-08-12 NOTE — Anesthesia Preprocedure Evaluation (Addendum)
Anesthesia Evaluation  Patient identified by MRN, date of birth, ID band Patient awake    Reviewed: Allergy & Precautions, NPO status , Patient's Chart, lab work & pertinent test results, reviewed documented beta blocker date and time   Airway Mallampati: II  TM Distance: >3 FB Neck ROM: Full    Dental  (+) Edentulous Upper, Edentulous Lower   Pulmonary pneumonia, resolved, former smoker,    Pulmonary exam normal breath sounds clear to auscultation       Cardiovascular hypertension, Pt. on medications and Pt. on home beta blockers + CAD, + Past MI, + Cardiac Stents, + Peripheral Vascular Disease and +CHF  Normal cardiovascular exam Rhythm:Regular Rate:Normal     Neuro/Psych PSYCHIATRIC DISORDERS Depression Diabetic peripheral neuropathy  Neuromuscular disease CVA    GI/Hepatic negative GI ROS, Neg liver ROS,   Endo/Other  diabetes, Well Controlled, Type 2, Insulin DependentMorbid obesityHyperlipidemia Gout  Renal/GU CRFRenal disease  negative genitourinary   Musculoskeletal  (+) Arthritis , Osteoarthritis,    Abdominal (+) + obese,   Peds  Hematology  (+) anemia ,   Anesthesia Other Findings   Reproductive/Obstetrics                             Anesthesia Physical Anesthesia Plan  ASA: III  Anesthesia Plan: MAC   Post-op Pain Management:    Induction: Intravenous  PONV Risk Score and Plan: 2 and Treatment may vary due to age or medical condition, Ondansetron and Propofol infusion  Airway Management Planned: Natural Airway  Additional Equipment:   Intra-op Plan:   Post-operative Plan:   Informed Consent: I have reviewed the patients History and Physical, chart, labs and discussed the procedure including the risks, benefits and alternatives for the proposed anesthesia with the patient or authorized representative who has indicated his/her understanding and acceptance.        Plan Discussed with: CRNA and Surgeon  Anesthesia Plan Comments:         Anesthesia Quick Evaluation

## 2019-08-12 NOTE — Progress Notes (Addendum)
PROGRESS NOTE        PATIENT DETAILS Name: [name] Age: 68 y.o. Sex: female Date of Birth: May 23, 1951 Admit Date: 08/09/2019 Admitting Physician Mckinley Jewel, MD CVE:[id number], Jannifer Rodney, MD  Brief Narrative: Patient is a 68 y.o. female with history of CKD stage IV, PAD s/p left BKA, CAD, insulin-dependent DM-2, anemia of chronic disease-was brought to the hospital for evaluation of severe hypoglycemia, was found to have worsening renal function.  See below for further details  Subjective: Awake and alert-no major issues overnight.  Denies any chest pain or shortness of breath.  Assessment/Plan: Hypoglycemia: Probably secondary to very well controlled DM-2 in the setting of worsening renal function and accumulation of long-acting insulin.  A1c is 6.1-CBGs are stable with just SSI.  Given advanced age, frailty and numerous other medical comorbidities-best to allow some amount of permissive hyperglycemia in order to avoid life-threatening hypoglycemic episodes.  Suspect should be stable to go home with just sliding scale insulin-discussed with her daughter over the phone today.  Hypothermia: Secondary to hypoglycemia-no indication of any infection at this point.  Hypothermia has resolved.  AKI on CKD stage IV: AKI likely hemodynamically mediated-improving. Seen by nephrology and subsequently by [facility]-underwent right brachiocephalic arteriovenous fistula placement on 9/23.  No further recommendations from nephrology apart from outpatient follow-up with her primary nephrologist.  Hyperkalemia: Margaretha Glassing  Insulin-dependent DM-2 (A1c 6.1): See above.  Dyslipidemia: Continue statin  Hypertension: Controlled-continue Coreg  Chronic diastolic heart failure: Volume status stable.  Watch closely.  History of CVA: At baseline-moving all 4 extremities-continue Plavix and statin  History of PAD s/p left BKA  Anemia of chronic kidney disease: Hemoglobin  low but stable-likely secondary to CKD.  No evidence of blood loss.  Follow.  Depression: Continue Wellbutrin  Obesity: Estimated body mass index is 38.23 kg/m as calculated from the following:   Height as of this encounter: 5\' 2"  (1.575 m).   Weight as of this encounter: 94.8 kg.    Diet: Diet Order            Diet NPO time specified Except for: Sips with Meds  Diet effective midnight               DVT Prophylaxis: Prophylactic Lovenox  Code Status: Full code   Family Communication: Spoke with patient's daughter over the phone on 9/23  Disposition Plan: Remain inpatient-Home likely on 9/24-we will ask RN to provide insulin education today.  Antimicrobial agents: Anti-infectives (From admission, onward)   Start     Dose/Rate Route Frequency Ordered Stop   08/12/19 0700  [MAR Hold]  ceFAZolin (ANCEF) IVPB 2g/100 mL premix     (MAR Hold since Wed 08/12/2019 at 1029.Hold Reason: Transfer to a Procedural area.)  Note to Pharmacy: Send with pt to OR   2 g 200 mL/hr over 30 Minutes Intravenous On call 08/11/19 1629 08/12/19 1327   08/09/19 1330  cefTRIAXone (ROCEPHIN) 2 g in sodium chloride 0.9 % 100 mL IVPB     2 g 200 mL/hr over 30 Minutes Intravenous  Once 08/09/19 1326 08/09/19 1608      Procedures: None  CONSULTS:  nephrology  Time spent: 25- minutes-Greater than 50% of this time was spent in counseling, explanation of diagnosis, planning of further management, and coordination of care.  MEDICATIONS: Scheduled Meds:  [MAR Hold] atorvastatin  80  mg Oral q1800   [MAR Hold] buPROPion  300 mg Oral Daily   [MAR Hold] carvedilol  25 mg Oral Q12H   [MAR Hold] clopidogrel  75 mg Oral Daily   [MAR Hold] enoxaparin (LOVENOX) injection  30 mg Subcutaneous Q24H   [MAR Hold] ferrous sulfate  325 mg Oral Q breakfast   [MAR Hold] insulin aspart  0-9 Units Subcutaneous TID WC   Continuous Infusions:  sodium chloride     sodium chloride     PRN Meds:.[MAR  Hold] acetaminophen **OR** [MAR Hold] acetaminophen, fentaNYL (SUBLIMAZE) injection, [MAR Hold] ondansetron **OR** [MAR Hold] ondansetron (ZOFRAN) IV, ondansetron (ZOFRAN) IV   PHYSICAL EXAM: Vital signs: Vitals:   08/11/19 2115 08/12/19 0506 08/12/19 1036 08/12/19 1058  BP: (!) 148/79 (!) 148/69  (!) 184/60  Pulse: 83 78  80  Resp: 17 18    Temp: 99 F (37.2 C) 98.3 F (36.8 C)    TempSrc: Oral Oral    SpO2: 99% (!) 89%    Weight:  94.8 kg    Height:   5\' 2"  (1.575 m)    Filed Weights   08/11/19 0421 08/12/19 0506  Weight: 96.6 kg 94.8 kg   Body mass index is 38.23 kg/m.   Gen Exam:Alert awake-not in any distress HEENT:atraumatic, normocephalic Chest: B/L clear to auscultation anteriorly CVS:S1S2 regular Abdomen:soft non tender, non distended Extremities:no edema Neurology: Non focal Skin: no rash  I have personally reviewed following labs and imaging studies  LABORATORY DATA: CBC: Recent Labs  Lab 08/09/19 1224 08/10/19 0535 08/11/19 0332 08/12/19 0318  WBC 5.1 4.9 4.9 5.6  NEUTROABS 3.8  --   --   --   HGB 9.7* 8.2* 7.5* 7.8*  HCT 29.2* 24.3* 23.4* 24.1*  MCV 99.3 98.8 99.6 99.2  PLT 157 131* 125* 126*    Basic Metabolic Panel: Recent Labs  Lab 08/09/19 1224 08/10/19 0535 08/11/19 0332 08/12/19 0318  NA 139 142 140 142  K 5.4* 4.2 4.3 4.0  CL 107 109 108 113*  CO2 22 22 21* 20*  GLUCOSE 207* 183* 247* 144*  BUN 84* 82* 84* 77*  CREATININE 4.09* 4.50* 4.00* 3.72*  CALCIUM 9.3 9.2 8.6* 9.0  MG 2.3  --   --   --   PHOS 5.1*  --  4.1  --     GFR: Estimated Creatinine Clearance: 15.5 mL/min (A) (by C-G formula based on SCr of 3.72 mg/dL (H)).  Liver Function Tests: Recent Labs  Lab 08/09/19 1224 08/10/19 0535 08/11/19 0332  AST 35 13*  --   ALT 10 11  --   ALKPHOS 118 97  --   BILITOT 0.9 0.7  --   PROT 6.7 5.8*  --   ALBUMIN 3.1* 2.6* 2.5*   No results for input(s): LIPASE, AMYLASE in the last 168 hours. No results for input(s):  AMMONIA in the last 168 hours.  Coagulation Profile: Recent Labs  Lab 08/09/19 1356  INR 1.1    Cardiac Enzymes: No results for input(s): CKTOTAL, CKMB, CKMBINDEX, TROPONINI in the last 168 hours.  BNP (last 3 results) No results for input(s): PROBNP in the last 8760 hours.  HbA1C: No results for input(s): HGBA1C in the last 72 hours.  CBG: Recent Labs  Lab 08/11/19 1534 08/11/19 2117 08/12/19 0732 08/12/19 1043 08/12/19 1439  GLUCAP 202* 168* 148* 124* 131*    Lipid Profile: No results for input(s): CHOL, HDL, LDLCALC, TRIG, CHOLHDL, LDLDIRECT in the last 72 hours.  Thyroid  Function Tests: Recent Labs    08/10/19 0535  TSH 1.948    Anemia Panel: No results for input(s): VITAMINB12, FOLATE, FERRITIN, TIBC, IRON, RETICCTPCT in the last 72 hours.  Urine analysis:    Component Value Date/Time   COLORURINE YELLOW 08/09/2019 1518   APPEARANCEUR HAZY (A) 08/09/2019 1518   LABSPEC 1.011 08/09/2019 1518   PHURINE 8.0 08/09/2019 1518   GLUCOSEU NEGATIVE 08/09/2019 1518   HGBUR NEGATIVE 08/09/2019 1518   BILIRUBINUR NEGATIVE 08/09/2019 1518   KETONESUR NEGATIVE 08/09/2019 1518   PROTEINUR 30 (A) 08/09/2019 1518   UROBILINOGEN 0.2 08/24/2015 1002   NITRITE NEGATIVE 08/09/2019 1518   LEUKOCYTESUR LARGE (A) 08/09/2019 1518    Sepsis Labs: Lactic Acid, Venous    Component Value Date/Time   LATICACIDVEN 2.5 (HH) 08/09/2019 1356    MICROBIOLOGY: Recent Results (from the past 240 hour(s))  Blood culture (routine x 2)     Status: None (Preliminary result)   Collection Time: 08/09/19 12:42 PM   Specimen: BLOOD RIGHT FOREARM  Result Value Ref Range Status   Specimen Description BLOOD RIGHT FOREARM  Final   Special Requests   Final    BOTTLES DRAWN AEROBIC AND ANAEROBIC Blood Culture adequate volume   Culture   Final    NO GROWTH 3 DAYS Performed at [facility], [place] [address] [place], [place] [address]    Report Status PENDING  Incomplete  Blood  culture (routine x 2)     Status: None (Preliminary result)   Collection Time: 08/09/19 12:47 PM   Specimen: BLOOD RIGHT WRIST  Result Value Ref Range Status   Specimen Description BLOOD RIGHT WRIST  Final   Special Requests   Final    BOTTLES DRAWN AEROBIC AND ANAEROBIC Blood Culture adequate volume   Culture   Final    NO GROWTH 3 DAYS Performed at [facility], [place] [address] [place], [place] [address]    Report Status PENDING  Incomplete  Urine culture     Status: Abnormal   Collection Time: 08/09/19  3:04 PM   Specimen: In/Out Cath Urine  Result Value Ref Range Status   Specimen Description IN/OUT CATH URINE  Final   Special Requests   Final    NONE Performed at [facility], [place] [address], [place], [place] [address]    Culture MULTIPLE SPECIES PRESENT, SUGGEST RECOLLECTION (A)  Final   Report Status 08/11/2019 FINAL  Final    RADIOLOGY STUDIES/RESULTS: US Renal  Result Date: 08/09/2019 CLINICAL DATA:  Acute on chronic kidney disease EXAM: RENAL / URINARY TRACT ULTRASOUND COMPLETE COMPARISON:  06/01/2013 FINDINGS: Right Kidney: Renal measurements: 9.1 x 4.2 x 2.8 cm = volume: 57 mL. Mild renal cortical atrophy. Increased renal cortical echogenicity. No mass or hydronephrosis visualized. Left Kidney: Renal measurements: 9.0 x 5.1 x 3.3 = volume: 80 mL. Mild renal cortical atrophy. Increased renal cortical echogenicity. No mass or hydronephrosis visualized. Bladder: Appears normal for degree of bladder distention. IMPRESSION: 1. No evidence of obstructive uropathy. 2. Echogenic kidneys suggesting sequela of chronic medical renal disease. Electronically Signed   By: Davina Poke M.D.   On: 08/09/2019 17:53   Dg Chest Portable 1 View  Result Date: 08/09/2019 CLINICAL DATA:  Hypothermia, hypoglycemia EXAM: PORTABLE CHEST 1 VIEW COMPARISON:  Chest x-rays dated 01/10/2015 and 08/06/2014. FINDINGS: Stable cardiomegaly. Lungs are clear. No pleural effusion or  pneumothorax seen. Osseous structures about the chest are unremarkable. IMPRESSION: 1. No active cardiopulmonary disease. No evidence  of pneumonia or pulmonary edema. 2. Stable cardiomegaly. Electronically Signed   By: Franki Cabot M.D.   On: 08/09/2019 13:41   Vas [place] Upper Ext Vein Mapping (pre-op Avf)  Result Date: 08/11/2019 UPPER EXTREMITY VEIN MAPPING  Indications: Pre-access. Performing Technologist: June Leap RDMS, RVT  Examination Guidelines: A complete evaluation includes B-mode imaging, spectral Doppler, color Doppler, and power Doppler as needed of all accessible portions of each vessel. Bilateral testing is considered an integral part of a complete examination. Limited examinations for reoccurring indications may be performed as noted. +-----------------+-------------+----------+---------+  Right Cephalic    Diameter (cm) Depth (cm) Findings   +-----------------+-------------+----------+---------+  Shoulder              0.33         1.22               +-----------------+-------------+----------+---------+  Mid upper arm         0.34         0.50               +-----------------+-------------+----------+---------+  Dist upper arm        0.34         0.92    tortuous   +-----------------+-------------+----------+---------+  Antecubital fossa     0.36                            +-----------------+-------------+----------+---------+  Prox forearm          0.20                 branching  +-----------------+-------------+----------+---------+  Mid forearm           0.20                            +-----------------+-------------+----------+---------+ +-----------------+-------------+----------+---------+  Right Basilic     Diameter (cm) Depth (cm) Findings   +-----------------+-------------+----------+---------+  Mid upper arm         0.36         1.93               +-----------------+-------------+----------+---------+  Dist upper arm        0.36         1.80    branching   +-----------------+-------------+----------+---------+  Antecubital fossa     0.36                            +-----------------+-------------+----------+---------+ +-----------------+-------------+----------+---------+  Left Basilic      Diameter (cm) Depth (cm) Findings   +-----------------+-------------+----------+---------+  Mid upper arm         0.43         2.50               +-----------------+-------------+----------+---------+  Dist upper arm        0.37         1.72               +-----------------+-------------+----------+---------+  Antecubital fossa     0.26         1.40    branching  +-----------------+-------------+----------+---------+ Previous AVF left arm. *See table(s) above for measurements and observations.  Diagnosing physician: Deitra Mayo MD Electronically signed by Deitra Mayo MD on 08/11/2019 at 4:14:07 PM.    Final      LOS: 2 days   Oren Binet, MD  [facility]  If 7PM-7AM, please contact night-coverage  Please page via [email or web address]  Go to [email or web address] and use [place]'s universal password to access. If you do not have the password, please contact the hospital operator.  Locate the [facility] provider you are looking for under [facility] and page to a number that you can be directly reached. If you still have difficulty reaching the provider, please page the [facility] (Director on Call) for the Hospitalists listed on amion for assistance.  08/12/2019, 3:21 PM

## 2019-08-13 ENCOUNTER — Encounter (HOSPITAL_COMMUNITY): Payer: Self-pay | Admitting: Vascular Surgery

## 2019-08-13 DIAGNOSIS — N179 Acute kidney failure, unspecified: Principal | ICD-10-CM

## 2019-08-13 DIAGNOSIS — I10 Essential (primary) hypertension: Secondary | ICD-10-CM

## 2019-08-13 LAB — GLUCOSE, CAPILLARY
Glucose-Capillary: 181 mg/dL — ABNORMAL HIGH (ref 70–99)
Glucose-Capillary: 212 mg/dL — ABNORMAL HIGH (ref 70–99)

## 2019-08-13 MED ORDER — INSULIN PEN NEEDLE 32G X 8 MM MISC: 0 refills | Status: DC

## 2019-08-13 MED ORDER — NOVOLOG FLEXPEN 100 UNIT/ML ~~LOC~~ SOPN: PEN_INJECTOR | SUBCUTANEOUS | 0 refills | Status: DC

## 2019-08-13 MED ORDER — INSULIN PEN NEEDLE 31G X 8 MM MISC: 0 refills | Status: DC

## 2019-08-13 NOTE — Plan of Care (Signed)
  Problem: Health Behavior/Discharge Planning: Goal: Ability to manage health-related needs will improve Outcome: Adequate for Discharge   Problem: Clinical Measurements: Goal: Ability to maintain clinical measurements within normal limits will improve Outcome: Adequate for Discharge Goal: Diagnostic test results will improve Outcome: Adequate for Discharge   Problem: Activity: Goal: Risk for activity intolerance will decrease Outcome: Adequate for Discharge   Problem: Clinical Measurements: Goal: Will remain free from infection Outcome: Completed/Met Goal: Respiratory complications will improve Outcome: Completed/Met Goal: Cardiovascular complication will be avoided Outcome: Completed/Met   Problem: Nutrition: Goal: Adequate nutrition will be maintained Outcome: Completed/Met   Problem: Elimination: Goal: Will not experience complications related to bowel motility Outcome: Completed/Met   Problem: Pain Managment: Goal: General experience of comfort will improve Outcome: Completed/Met   Problem: Safety: Goal: Ability to remain free from injury will improve Outcome: Completed/Met   Problem: Skin Integrity: Goal: Risk for impaired skin integrity will decrease Outcome: Completed/Met

## 2019-08-13 NOTE — TOC Transition Note (Signed)
Transition of Care [facility]) - CM/SW Discharge Note   Patient Details  Name: [name] MRN: [id number] Date of Birth: March 05, 1951  [facility]) CM/SW Contact:  Bethena Roys, RN Phone Number: 08/13/2019, 12:46 PM   Clinical Narrative:   Plan for patient to transition home today. CM was able to find an agency to accept the patient for [place]. [facility] willing to staff the patient within 48 hours of transition home. Patient and daughter agreeable to services. Orders placed in Epic- daughter states that the granddaughter will assist in transportation home. No further needs from CM at this time.    Final next level of care: [place] Barriers to Discharge: No Barriers Identified   Patient Goals and CMS Choice Patient states their goals for this hospitalization and ongoing recovery are:: "to return home" CMS Medicare.gov Compare Post Acute Care list provided to:: Patient Choice offered to / list presented to : Patient   Discharge Plan and Services In-house Referral: NA   Post Acute Care Choice: Home Health             HH Arranged: Social Work [facility] Agency: [facility] Date [facility] Agency Contacted: 08/13/19 Time [place]: 1941 Representative spoke with at [place]: [place]  Social Determinants of Health (SDOH) Interventions     Readmission Risk Interventions No flowsheet data found.

## 2019-08-13 NOTE — Care Management Important Message (Signed)
Important Message  Patient Details  Name: [name] MRN: [id number] Date of Birth: [birth year]   Medicare Important Message Given:  Yes     [name] 08/13/2019, 3:23 PM

## 2019-08-13 NOTE — Evaluation (Addendum)
Occupational Therapy Evaluation Patient Details Name: [name] MRN: [id number] DOB: [birth year] Today's Date: 08/13/2019    History of Present Illness 68 y.o. female PMH coronary artery disease, peripheral arterial disease status post left BKA, chronic kidney disease stage IV with fistula placed in January 2015, CVA in 2014, diabetes mellitus, anemia of chronic disease brought by EMS to ED low blood sugar of 36, hypothermia and worsening renal function. Admitted 9/20 for hypoglycemia, hypothermia and AKI   Clinical Impression   Pt admitted with above diagnoses, with generalized weakness, baseline L BKA, and pain from R antecubital incision limiting ability to engage in BADL at desired level of ind. PTA, pt living with dtr whom works during the day. Pt with inconsistencies on report in regards to PLOF. She mostly states that she sits on the side of the bed most of the day and dtr has to help her into a w/c. She claims to occasionally have help but mostly transfers to [facility] alone. At time of eval, pt sitting EOB attempting to feed self and ultimately needing a min A given pain at R antecubital incision site. She was min A +2 for safety with squat pivot transfers. Educated pt on safety and awareness of deficits, pt adamant that she will be able to at home. Given current level of function, recommend SNF at d/c for safe progression in BADL prior to returning home. If SNF refused, pt will need HHOT and 24/7 supervision. Will continue to follow per POC listed below.     Follow Up Recommendations  SNF;Supervision/Assistance - 24 hour;Other (comment)(HHOT if SNF refused)    Equipment Recommendations  None recommended by OT    Recommendations for Other Services       Precautions / Restrictions Precautions Precautions: Fall Required Braces or Orthoses: Other Brace Other Brace: L BK prosthetic Restrictions Weight Bearing Restrictions: No      Mobility Bed Mobility               General  bed mobility comments: sitting on side of bed on arrival  Transfers Overall transfer level: Needs assistance Equipment used: Pushed w/c Transfers: Squat Pivot Transfers     Squat pivot transfers: Min assist;+2 physical assistance;+2 safety/equipment     General transfer comment: min A +2 for safety from EOB to w/c; w/c angled and pulled closely to pt    Balance Overall balance assessment: Needs assistance Sitting-balance support: Feet supported;No upper extremity supported Sitting balance-Leahy Scale: Fair                                     ADL either performed or assessed with clinical judgement   ADL Overall ADL's : Needs assistance/impaired Eating/Feeding: Minimal assistance;Sitting Eating/Feeding Details (indicate cue type and reason): to cut up food and get to mouth with RUE antecubital incision Grooming: Minimal assistance;Sitting       Lower Body Bathing: Moderate assistance;Sit to/from stand;Sitting/lateral leans   Upper Body Dressing : Minimal assistance;Sitting   Lower Body Dressing: Maximal assistance;Total assistance;Sit to/from stand;Sitting/lateral leans   Toilet Transfer: Minimal assistance;+2 for physical assistance;+2 for safety/equipment;Squat-pivot;BSC   Toileting- Clothing Manipulation and Hygiene: Maximal assistance;Sitting/lateral lean;Sit to/from stand   Tub/ Shower Transfer: Minimal assistance;+2 for physical assistance;+2 for safety/equipment;Shower seat   Functional mobility during ADLs: Minimal assistance;+2 for physical assistance;+2 for safety/equipment;Wheelchair(for SPT to w/c) General ADL Comments: p tltd 2/2 generalized weakness, hx of L BKA, and pain from R  antecubital incision     Vision Patient Visual Report: No change from baseline       Perception     Praxis      Pertinent Vitals/Pain Pain Assessment: Faces Faces Pain Scale: Hurts a little bit Pain Location: R antecubital region Pain Descriptors /  Indicators: Sore;Aching;Guarding Pain Intervention(s): Monitored during session     Hand Dominance     Extremity/Trunk Assessment Upper Extremity Assessment Upper Extremity Assessment: Generalized weakness;RUE deficits/detail RUE Deficits / Details: antecubital incision, sore and difficulty engaging in BADL/t/f RUE is dominant   Lower Extremity Assessment Lower Extremity Assessment: Defer to PT evaluation       Communication Communication Communication: No difficulties   Cognition Arousal/Alertness: Awake/alert Behavior During Therapy: WFL for tasks assessed/performed Overall Cognitive Status: No family/caregiver present to determine baseline cognitive functioning                                 General Comments: unsure of baseline, pt with inconsistencies on home report. She appears to have decreased awareness of deficits and safety throughout eval   General Comments       Exercises     Shoulder Instructions      Home Living Family/patient expects to be discharged to:: Private residence Living Arrangements: Children(dtr, granddtr does not live there but occasionally helps) Available Help at Discharge: Family;Available PRN/intermittently(dtr works during day) Type of Home: House Home Access: Level entry     [place]: One level     Bathroom Shower/Tub: Teacher, early years/pre: (uses BSC) Bathroom Accessibility: No   Home Equipment: Environmental consultant - 2 wheels;Bedside commode;Wheelchair - manual   Additional Comments: pt with inconsistencies on report from OT to PT. Pt shares that she puts some sort of chair in the tub to shower with dtr, tells other she takes sponge baths      Prior Functioning/Environment Level of Independence: Needs assistance  Gait / Transfers Assistance Needed: pt shares she spends most of the day sitting on the side of her bed and squat pivots to [facility]. Dons prosthetic for w/c t/f when leaving house. Shares with OT dtr helps  her transfer, tells PT she does it by herself ADL's / Homemaking Assistance Needed: dtr and granddtr assist with bathing, dressing, IADL management            OT Problem List: Decreased strength;Decreased knowledge of use of DME or AE;Obesity;Decreased activity tolerance;Decreased safety awareness;Impaired balance (sitting and/or standing);Impaired UE functional use      OT Treatment/Interventions: Self-care/ADL training;Therapeutic exercise;Patient/family education;Balance training;Energy conservation;Therapeutic activities;DME and/or AE instruction;Cognitive remediation/compensation    OT Goals(Current goals can be found in the care plan section) Acute Rehab OT Goals Patient Stated Goal: go home OT Goal Formulation: With patient Time For Goal Achievement: 08/27/19 Potential to Achieve Goals: Good  OT Frequency: Min 2X/week   Barriers to D/C:    reported dtr had recently fallen and hurt self       Co-evaluation PT/OT/SLP Co-Evaluation/Treatment: Yes Reason for Co-Treatment: To address functional/ADL transfers PT goals addressed during session: Mobility/safety with mobility;Proper use of DME OT goals addressed during session: ADL's and self-care;Strengthening/ROM      AM-PAC OT "6 Clicks" Daily Activity     Outcome Measure Help from another person eating meals?: A Little Help from another person taking care of personal grooming?: A Little Help from another person toileting, which includes using toliet, bedpan, or urinal?: A Lot Help  from another person bathing (including washing, rinsing, drying)?: A Lot Help from another person to put on and taking off regular upper body clothing?: A Little Help from another person to put on and taking off regular lower body clothing?: A Lot 6 Click Score: 15   End of Session Equipment Utilized During Treatment: Gait belt;Other (comment)(w/c) Nurse Communication: Mobility status  Activity Tolerance: Patient tolerated treatment  well Patient left: in bed;with call bell/phone within reach;Other (comment)(sitting EOB)  OT Visit Diagnosis: Other abnormalities of gait and mobility (R26.89);Muscle weakness (generalized) (M62.81);Pain Pain - Right/Left: Right Pain - part of body: Arm                Time: 1326-1400 OT Time Calculation (min): 34 min Charges:  OT General Charges $OT Visit: 1 Visit OT Evaluation $OT Eval Moderate Complexity: 1 [place], MSOT, OTR/L [employer] OT/ Acute Relief OT [facility] Office: [phone]   Zenovia Jarred 08/13/2019, 3:18 PM

## 2019-08-13 NOTE — Progress Notes (Signed)
[facility]  Subjective  - States right hand feels good, no numbness or tingling or weakness.   Objective (!) 120/40 83 98.1 F (36.7 C) (Oral) 15 96%  Intake/Output Summary (Last 24 hours) at 08/13/2019 0747 Last data filed at 08/13/2019 0640 Gross per 24 hour  Intake 1260 ml  Output 700 ml  Net 560 ml    Right brachiocephalic AVF with great thrill, no hematoma, incision looks great  Laboratory Lab Results: Recent Labs    08/11/19 0332 08/12/19 0318  WBC 4.9 5.6  HGB 7.5* 7.8*  HCT 23.4* 24.1*  PLT 125* 126*   BMET Recent Labs    08/11/19 0332 08/12/19 0318  NA 140 142  K 4.3 4.0  CL 108 113*  CO2 21* 20*  GLUCOSE 247* 144*  BUN 84* 77*  CREATININE 4.00* 3.72*  CALCIUM 8.6* 9.0    COAG Lab Results  Component Value Date   INR 1.1 08/09/2019   INR 1.03 12/19/2016   INR 1.09 03/06/2015   No results found for: PTT  Assessment/Planning: POD #1 s/p right brachiocephalic AVF.  Good thrill.  No weakness or numbness in right hand.  Incision looks great.  Can be discharged from our standpoint.  Will arrange follow-up in 4-6 weeks for duplex to ensure it is maturing.    [name] 08/13/2019 7:47 AM --

## 2019-08-13 NOTE — Discharge Summary (Signed)
PATIENT DETAILS Name: [name] Age: 68 y.o. Sex: female Date of Birth: 10-29-1951 MRN: [id number]. Admitting Physician: Mckinley Jewel, MD RCV:[id number], Jannifer Rodney, MD  Admit Date: 08/09/2019 Discharge date: 08/13/2019  Recommendations for Outpatient Follow-up:  1. Follow up with PCP in 1-2 weeks 2. Please obtain BMP/CBC in one week 3. Ensure outpatient follow-up with nephrology and vascular surgery 4. Allow some amount of permissive hyperglycemia-to prevent life-threatening hypoglycemic episodes.Being discharged just on sliding scale insulin-recheck A1c in the next few months.   Admitted From:  Home  Disposition: Home with home health services   [place]: Yes  Equipment/Devices: None  Discharge Condition: Stable  CODE STATUS: FULL CODE  Diet recommendation:  Diet Order            Diet - low sodium heart healthy        Diet Carb Modified        Diet heart healthy/carb modified Room service appropriate? Yes; Fluid consistency: Thin  Diet effective now               Brief Summary: See H&P, Labs, Consult and Test reports for all details in brief,Patient is a 68 y.o. female with history of CKD stage IV, PAD s/p left BKA, CAD, insulin-dependent DM-2, anemia of chronic disease-was brought to the hospital for evaluation of severe hypoglycemia, was found to have worsening renal function.  See below for further details  Brief Hospital Course: Hypoglycemia: Probably secondary to very well controlled DM-2 in the setting of worsening renal function and accumulation of long-acting insulin.  A1c is 6.1-CBGs are stable with just SSI.  Given advanced age, frailty and numerous other medical comorbidities-best to allow some amount of permissive hyperglycemia in order to avoid life-threatening hypoglycemic episodes.  Suspect should be stable to go home with just sliding scale insulin-discussed with her daughter over the phone on 9/23-have asked nursing staff to provide  education prior to discharge.  Hypothermia: Secondary to hypoglycemia-no indication of any infection at this point.  Hypothermia has resolved.  AKI on CKD stage IV: AKI likely hemodynamically mediated-renal function slowly improving improving. Seen by nephrology and subsequently by [facility]-underwent right brachiocephalic arteriovenous fistula placement on 9/23.  No further recommendations from nephrology apart from outpatient follow-up with her primary nephrologist.  Hyperkalemia: Margaretha Glassing  Insulin-dependent DM-2 (A1c 6.1): See above.  Dyslipidemia: Continue statin  Hypertension: Controlled-continue Coreg  Chronic diastolic heart failure: Volume status stable.  Spoke with nephrology (Dr. Tanna Savoy to resume diuretics on discharge.  History of CVA: At baseline-moving all 4 extremities-continue Plavix and statin  History of PAD s/p left BKA  Anemia of chronic kidney disease: Hemoglobin low but stable-likely secondary to CKD.  No evidence of blood loss.  Follow.  Depression: Continue Wellbutrin  Obesity: Estimated body mass index is 38.23 kg/m as calculated from the following:   Height as of this encounter: 5\' 2"  (1.575 m).   Weight as of this encounter: 94.8 kg.   Procedures/Studies: 7/51>>[facility] brachiocephalic arteriovenous fistula placement  Discharge Diagnoses:  Principal Problem:   Hypoglycemia Active Problems:   Anemia of chronic disease   Type 2 diabetes mellitus with renal manifestations (HCC)   Peripheral arterial disease: s/p L BKA; Occluded R Pop A, 2 V runnoff   Chronic kidney disease (CKD), stage IV (severe) (HCC)   Essential hypertension   CVA (cerebral vascular accident) ([place])   Hyperkalemia   Lactic acidosis   Hypothermia   AKI (acute kidney injury) [facility])   Discharge Instructions:  Activity:  As tolerated with Full fall precautions use walker/cane & assistance as needed  Discharge Instructions    Call MD for:  difficulty  breathing, headache or visual disturbances   Complete by: As directed    Diet - low sodium heart healthy   Complete by: As directed    Diet Carb Modified   Complete by: As directed    Discharge instructions   Complete by: As directed    Follow with Primary MD  Katherina Mires, MD in 1-2 weeks  Follow-up with your primary nephrologist in the next 2 weeks  Follow with vascular surgery as instructed  Please get a complete blood count and chemistry panel checked by your Primary MD at your next visit, and again as instructed by your Primary MD.  Get Medicines reviewed and adjusted: Please take all your medications with you for your next visit with your Primary MD  Laboratory/radiological data: Please request your Primary MD to go over all hospital tests and procedure/radiological results at the follow up, please ask your Primary MD to get all Hospital records sent to his/her office.  In some cases, they will be blood work, cultures and biopsy results pending at the time of your discharge. Please request that your primary care M.D. follows up on these results.  Also Note the following: If you experience worsening of your admission symptoms, develop shortness of breath, life threatening emergency, suicidal or homicidal thoughts you must seek medical attention immediately by calling 911 or calling your MD immediately  if symptoms less severe.  You must read complete instructions/literature along with all the possible adverse reactions/side effects for all the Medicines you take and that have been prescribed to you. Take any new Medicines after you have completely understood and accpet all the possible adverse reactions/side effects.   Do not drive when taking Pain medications or sleeping medications (Benzodaizepines)  Do not take more than prescribed Pain, Sleep and Anxiety Medications. It is not advisable to combine anxiety,sleep and pain medications without talking with your primary care  practitioner  Special Instructions: If you have smoked or chewed Tobacco  in the last 2 yrs please stop smoking, stop any regular Alcohol  and or any Recreational drug use.  Wear Seat belts while driving.  Please note: You were cared for by a hospitalist during your hospital stay. Once you are discharged, your primary care physician will handle any further medical issues. Please note that NO REFILLS for any discharge medications will be authorized once you are discharged, as it is imperative that you return to your primary care physician (or establish a relationship with a primary care physician if you do not have one) for your post hospital discharge needs so that they can reassess your need for medications and monitor your lab values.   Increase activity slowly   Complete by: As directed      Allergies as of 08/13/2019   No Known Allergies     Medication List    STOP taking these medications   insulin glargine 100 UNIT/ML injection Commonly known as: LANTUS   insulin lispro protamine-lispro (50-50) 100 UNIT/ML Susp injection Commonly known as: HUMALOG 50/50 MIX     TAKE these medications   Accu-Chek Aviva Plus test strip Generic drug: glucose blood USE ONE STRIP TO CHECK GLUCOSE BEFORE MEALS AND AT BEDTIME   atorvastatin 80 MG tablet Commonly known as: LIPITOR Take 1 tablet (80 mg total) by mouth daily at 6 PM.   buPROPion 300 MG 24  hr tablet Commonly known as: WELLBUTRIN XL Take 300 mg by mouth daily.   carvedilol 25 MG tablet Commonly known as: COREG Take 25 mg by mouth every 12 (twelve) hours.   clopidogrel 75 MG tablet Commonly known as: Plavix Take 1 tablet (75 mg total) by mouth daily.   colchicine 0.6 MG tablet Take 0.6 mg by mouth daily.   ezetimibe 10 MG tablet Commonly known as: ZETIA Take 1 tablet (10 mg total) by mouth daily.   ferrous sulfate 325 (65 FE) MG tablet Take 325 mg by mouth daily with breakfast.   Fish Oil 1000 MG Caps Take 1,000 mg  by mouth 2 (two) times daily.   furosemide 40 MG tablet Commonly known as: LASIX Take 80 mg by mouth 2 (two) times daily.   Insulin Pen Needle 31G X 8 MM Misc USE TO ADMINISTER INSULIN VIA LANTUS AND NOVOLOG What changed: additional instructions   metaxalone 800 MG tablet Commonly known as: SKELAXIN Take 0.5-1 tablets (400-800 mg total) by mouth 3 (three) times daily. What changed:   when to take this  reasons to take this   NovoLOG FlexPen 100 UNIT/ML FlexPen Generic drug: insulin aspart 00-9 Units, Subcutaneous, 3 times daily with meals CBG < 70: Implement Hypoglycemia measures CBG 70 - 120: 0 units CBG 121 - 150: 1 unit CBG 151 - 200: 2 units CBG 201 - 250: 3 units CBG 251 - 300: 5 units CBG 301 - 350: 7 units CBG 351 - 400: 9 units CBG > 400: call MD   OXYGEN Inhale 2.5 L into the lungs as needed.      Follow-up Information    Marty Heck, MD In 6 weeks.   Specialty: Vascular Surgery Why: Office will call you to arrange your appt (sent) Contact information: [place] [address] [phone]        Katherina Mires, MD. Schedule an appointment as soon as possible for a visit in 2 week(s).   Specialty: Family Medicine Contact information: [place] [address] [phone]        [place], [place]. Schedule an appointment as soon as possible for a visit in 2 week(s).   Why: With your primary nephrologist Contact information: [place] [address] [phone]          No Known Allergies   Consultations:   nephrology and vascular surgery   Other Procedures/Studies: US Renal  Result Date: 08/09/2019 CLINICAL DATA:  Acute on chronic kidney disease EXAM: RENAL / URINARY TRACT ULTRASOUND COMPLETE COMPARISON:  06/01/2013 FINDINGS: Right Kidney: Renal measurements: 9.1 x 4.2 x 2.8 cm = volume: 57 mL. Mild renal cortical atrophy. Increased renal cortical echogenicity. No mass or  hydronephrosis visualized. Left Kidney: Renal measurements: 9.0 x 5.1 x 3.3 = volume: 80 mL. Mild renal cortical atrophy. Increased renal cortical echogenicity. No mass or hydronephrosis visualized. Bladder: Appears normal for degree of bladder distention. IMPRESSION: 1. No evidence of obstructive uropathy. 2. Echogenic kidneys suggesting sequela of chronic medical renal disease. Electronically Signed   By: Davina Poke M.D.   On: 08/09/2019 17:53   Dg Chest Portable 1 View  Result Date: 08/09/2019 CLINICAL DATA:  Hypothermia, hypoglycemia EXAM: PORTABLE CHEST 1 VIEW COMPARISON:  Chest x-rays dated 01/10/2015 and 08/06/2014. FINDINGS: Stable cardiomegaly. Lungs are clear. No pleural effusion or pneumothorax seen. Osseous structures about the chest are unremarkable. IMPRESSION: 1. No active cardiopulmonary disease. No evidence of pneumonia or pulmonary edema. 2.  Stable cardiomegaly. Electronically Signed   By: Franki Cabot M.D.   On: 08/09/2019 13:41   Vas [place] Upper Ext Vein Mapping (pre-op Avf)  Result Date: 08/11/2019 UPPER EXTREMITY VEIN MAPPING  Indications: Pre-access. Performing Technologist: June Leap RDMS, RVT  Examination Guidelines: A complete evaluation includes B-mode imaging, spectral Doppler, color Doppler, and power Doppler as needed of all accessible portions of each vessel. Bilateral testing is considered an integral part of a complete examination. Limited examinations for reoccurring indications may be performed as noted. +-----------------+-------------+----------+---------+  Right Cephalic    Diameter (cm) Depth (cm) Findings   +-----------------+-------------+----------+---------+  Shoulder              0.33         1.22               +-----------------+-------------+----------+---------+  Mid upper arm         0.34         0.50               +-----------------+-------------+----------+---------+  Dist upper arm        0.34         0.92    tortuous    +-----------------+-------------+----------+---------+  Antecubital fossa     0.36                            +-----------------+-------------+----------+---------+  Prox forearm          0.20                 branching  +-----------------+-------------+----------+---------+  Mid forearm           0.20                            +-----------------+-------------+----------+---------+ +-----------------+-------------+----------+---------+  Right Basilic     Diameter (cm) Depth (cm) Findings   +-----------------+-------------+----------+---------+  Mid upper arm         0.36         1.93               +-----------------+-------------+----------+---------+  Dist upper arm        0.36         1.80    branching  +-----------------+-------------+----------+---------+  Antecubital fossa     0.36                            +-----------------+-------------+----------+---------+ +-----------------+-------------+----------+---------+  Left Basilic      Diameter (cm) Depth (cm) Findings   +-----------------+-------------+----------+---------+  Mid upper arm         0.43         2.50               +-----------------+-------------+----------+---------+  Dist upper arm        0.37         1.72               +-----------------+-------------+----------+---------+  Antecubital fossa     0.26         1.40    branching  +-----------------+-------------+----------+---------+ Previous AVF left arm. *See table(s) above for measurements and observations.  Diagnosing physician: Deitra Mayo MD Electronically signed by Deitra Mayo MD on 08/11/2019 at 4:14:07 PM.    Final      TODAY-DAY OF DISCHARGE:  Subjective:   [name] today has no headache,no chest  abdominal pain,no new weakness tingling or numbness, feels much better wants to go home today.   Objective:   Blood pressure (!) 120/40, pulse 83, temperature 98.1 F (36.7 C), temperature source Oral, resp. rate 15, height 5\' 2"  (1.575 m), weight 94.7 kg, SpO2  96 %.  Intake/Output Summary (Last 24 hours) at 08/13/2019 1241 Last data filed at 08/13/2019 1017 Gross per 24 hour  Intake 1500 ml  Output 700 ml  Net 800 ml   Filed Weights   08/11/19 0421 08/12/19 0506 08/13/19 0700  Weight: 96.6 kg 94.8 kg 94.7 kg    Exam: Awake Alert, Oriented *3, No new F.N deficits, Normal affect [place].AT,PERRAL Supple Neck,No JVD, No cervical lymphadenopathy appriciated.  Symmetrical Chest wall movement, Good air movement bilaterally, CTAB RRR,No Gallops,Rubs or new Murmurs, No Parasternal Heave +ve B.Sounds, Abd Soft, Non tender, No organomegaly appriciated, No rebound -guarding or rigidity. No Cyanosis, Clubbing or edema, No new Rash or bruise   PERTINENT RADIOLOGIC STUDIES: US Renal  Result Date: 08/09/2019 CLINICAL DATA:  Acute on chronic kidney disease EXAM: RENAL / URINARY TRACT ULTRASOUND COMPLETE COMPARISON:  06/01/2013 FINDINGS: Right Kidney: Renal measurements: 9.1 x 4.2 x 2.8 cm = volume: 57 mL. Mild renal cortical atrophy. Increased renal cortical echogenicity. No mass or hydronephrosis visualized. Left Kidney: Renal measurements: 9.0 x 5.1 x 3.3 = volume: 80 mL. Mild renal cortical atrophy. Increased renal cortical echogenicity. No mass or hydronephrosis visualized. Bladder: Appears normal for degree of bladder distention. IMPRESSION: 1. No evidence of obstructive uropathy. 2. Echogenic kidneys suggesting sequela of chronic medical renal disease. Electronically Signed   By: Davina Poke M.D.   On: 08/09/2019 17:53   Dg Chest Portable 1 View  Result Date: 08/09/2019 CLINICAL DATA:  Hypothermia, hypoglycemia EXAM: PORTABLE CHEST 1 VIEW COMPARISON:  Chest x-rays dated 01/10/2015 and 08/06/2014. FINDINGS: Stable cardiomegaly. Lungs are clear. No pleural effusion or pneumothorax seen. Osseous structures about the chest are unremarkable. IMPRESSION: 1. No active cardiopulmonary disease. No evidence of pneumonia or pulmonary edema. 2. Stable cardiomegaly.  Electronically Signed   By: Franki Cabot M.D.   On: 08/09/2019 13:41   Vas [place] Upper Ext Vein Mapping (pre-op Avf)  Result Date: 08/11/2019 UPPER EXTREMITY VEIN MAPPING  Indications: Pre-access. Performing Technologist: June Leap RDMS, RVT  Examination Guidelines: A complete evaluation includes B-mode imaging, spectral Doppler, color Doppler, and power Doppler as needed of all accessible portions of each vessel. Bilateral testing is considered an integral part of a complete examination. Limited examinations for reoccurring indications may be performed as noted. +-----------------+-------------+----------+---------+  Right Cephalic    Diameter (cm) Depth (cm) Findings   +-----------------+-------------+----------+---------+  Shoulder              0.33         1.22               +-----------------+-------------+----------+---------+  Mid upper arm         0.34         0.50               +-----------------+-------------+----------+---------+  Dist upper arm        0.34         0.92    tortuous   +-----------------+-------------+----------+---------+  Antecubital fossa     0.36                            +-----------------+-------------+----------+---------+  Prox forearm  0.20                 branching  +-----------------+-------------+----------+---------+  Mid forearm           0.20                            +-----------------+-------------+----------+---------+ +-----------------+-------------+----------+---------+  Right Basilic     Diameter (cm) Depth (cm) Findings   +-----------------+-------------+----------+---------+  Mid upper arm         0.36         1.93               +-----------------+-------------+----------+---------+  Dist upper arm        0.36         1.80    branching  +-----------------+-------------+----------+---------+  Antecubital fossa     0.36                            +-----------------+-------------+----------+---------+ +-----------------+-------------+----------+---------+   Left Basilic      Diameter (cm) Depth (cm) Findings   +-----------------+-------------+----------+---------+  Mid upper arm         0.43         2.50               +-----------------+-------------+----------+---------+  Dist upper arm        0.37         1.72               +-----------------+-------------+----------+---------+  Antecubital fossa     0.26         1.40    branching  +-----------------+-------------+----------+---------+ Previous AVF left arm. *See table(s) above for measurements and observations.  Diagnosing physician: Deitra Mayo MD Electronically signed by Deitra Mayo MD on 08/11/2019 at 4:14:07 PM.    Final      PERTINENT LAB RESULTS: CBC: Recent Labs    08/11/19 0332 08/12/19 0318  WBC 4.9 5.6  HGB 7.5* 7.8*  HCT 23.4* 24.1*  PLT 125* 126*   CMET CMP     Component Value Date/Time   NA 142 08/12/2019 0318   K 4.0 08/12/2019 0318   CL 113 (H) 08/12/2019 0318   CO2 20 (L) 08/12/2019 0318   GLUCOSE 144 (H) 08/12/2019 0318   BUN 77 (H) 08/12/2019 0318   CREATININE 3.72 (H) 08/12/2019 0318   CREATININE 3.51 (H) 03/14/2015 1425   CALCIUM 9.0 08/12/2019 0318   CALCIUM 9.6 06/06/2017 1218   PROT 5.8 (L) 08/10/2019 0535   ALBUMIN 2.5 (L) 08/11/2019 0332   AST 13 (L) 08/10/2019 0535   ALT 11 08/10/2019 0535   ALKPHOS 97 08/10/2019 0535   BILITOT 0.7 08/10/2019 0535   GFRNONAA 12 (L) 08/12/2019 0318   GFRAA 14 (L) 08/12/2019 0318    GFR Estimated Creatinine Clearance: 15.5 mL/min (A) (by C-G formula based on SCr of 3.72 mg/dL (H)). No results for input(s): LIPASE, AMYLASE in the last 72 hours. No results for input(s): CKTOTAL, CKMB, CKMBINDEX, TROPONINI in the last 72 hours. Invalid input(s): POCBNP No results for input(s): DDIMER in the last 72 hours. No results for input(s): HGBA1C in the last 72 hours. No results for input(s): CHOL, HDL, LDLCALC, TRIG, CHOLHDL, LDLDIRECT in the last 72 hours. No results for input(s): TSH, T4TOTAL, T3FREE,  THYROIDAB in the last 72 hours.  Invalid input(s): FREET3 No results for input(s): VITAMINB12, FOLATE, FERRITIN, TIBC, IRON, RETICCTPCT in the  last 72 hours. Coags: No results for input(s): INR in the last 72 hours.  Invalid input(s): PT Microbiology: Recent Results (from the past 240 hour(s))  Blood culture (routine x 2)     Status: None (Preliminary result)   Collection Time: 08/09/19 12:42 PM   Specimen: BLOOD RIGHT FOREARM  Result Value Ref Range Status   Specimen Description BLOOD RIGHT FOREARM  Final   Special Requests   Final    BOTTLES DRAWN AEROBIC AND ANAEROBIC Blood Culture adequate volume   Culture   Final    NO GROWTH 4 DAYS Performed at [facility], [address] [place], [place] [address]    Report Status PENDING  Incomplete  Blood culture (routine x 2)     Status: None (Preliminary result)   Collection Time: 08/09/19 12:47 PM   Specimen: BLOOD RIGHT WRIST  Result Value Ref Range Status   Specimen Description BLOOD RIGHT WRIST  Final   Special Requests   Final    BOTTLES DRAWN AEROBIC AND ANAEROBIC Blood Culture adequate volume   Culture   Final    NO GROWTH 4 DAYS Performed at [facility], [place] [address], [place], [place] [address]    Report Status PENDING  Incomplete  Urine culture     Status: Abnormal   Collection Time: 08/09/19  3:04 PM   Specimen: In/Out Cath Urine  Result Value Ref Range Status   Specimen Description IN/OUT CATH URINE  Final   Special Requests   Final    NONE Performed at [facility], [place] [address] [place], [place] [address]    Culture MULTIPLE SPECIES PRESENT, SUGGEST RECOLLECTION (A)  Final   Report Status 08/11/2019 FINAL  Final    FURTHER DISCHARGE INSTRUCTIONS:  Get Medicines reviewed and adjusted: Please take all your medications with you for your next visit with your Primary MD  Laboratory/radiological data: Please request your Primary MD to go over all hospital tests and procedure/radiological  results at the follow up, please ask your Primary MD to get all Hospital records sent to his/her office.  In some cases, they will be blood work, cultures and biopsy results pending at the time of your discharge. Please request that your primary care M.D. goes through all the records of your hospital data and follows up on these results.  Also Note the following: If you experience worsening of your admission symptoms, develop shortness of breath, life threatening emergency, suicidal or homicidal thoughts you must seek medical attention immediately by calling 911 or calling your MD immediately  if symptoms less severe.  You must read complete instructions/literature along with all the possible adverse reactions/side effects for all the Medicines you take and that have been prescribed to you. Take any new Medicines after you have completely understood and accpet all the possible adverse reactions/side effects.   Do not drive when taking Pain medications or sleeping medications (Benzodaizepines)  Do not take more than prescribed Pain, Sleep and Anxiety Medications. It is not advisable to combine anxiety,sleep and pain medications without talking with your primary care practitioner  Special Instructions: If you have smoked or chewed Tobacco  in the last 2 yrs please stop smoking, stop any regular Alcohol  and or any Recreational drug use.  Wear Seat belts while driving.  Please note: You were cared for by a hospitalist during your hospital stay. Once you are discharged, your primary care physician will handle any further medical issues. Please note that NO  REFILLS for any discharge medications will be authorized once you are discharged, as it is imperative that you return to your primary care physician (or establish a relationship with a primary care physician if you do not have one) for your post hospital discharge needs so that they can reassess your need for medications and monitor your lab  values.  Total Time spent coordinating discharge including counseling, education and face to face time equals  45 minutes.  SignedOren Binet 08/13/2019 12:41 PM

## 2019-08-13 NOTE — Progress Notes (Signed)
Physical Therapy Treatment Patient Details Name: [name] MRN: [id number] DOB: [birth year] Today's Date: 08/13/2019    History of Present Illness 68 y.o. female PMH coronary artery disease, peripheral arterial disease status post left BKA, chronic kidney disease stage IV with fistula placed in January 2015, CVA in 2014, diabetes mellitus, anemia of chronic disease brought by EMS to ED low blood sugar of 36, hypothermia and worsening renal function. Admitted 9/20 for hypoglycemia, hypothermia and AKI, s/p R arm AVF 08/12/19    PT Comments    Pt agreeable to perform transfer from bed to w/c to simulate transfers she reports she does independently at home when her daughter is at work. Pt requires minAx2 for safe squat pivot transfer to/from w/c from bed. Pt having increased R UE pain from AVF yesterday which is limiting her safe mobility. PT continues to recommend SNF level rehab at d/c to return to independence with transfers, however pt d/c'ing home this afternoon.     Follow Up Recommendations  SNF;Supervision/Assistance - 24 hour     Equipment Recommendations  None recommended by PT    Recommendations for Other Services OT consult     Precautions / Restrictions Precautions Precautions: Fall Required Braces or Orthoses: Other Brace Other Brace: L BK prosthetic Restrictions Weight Bearing Restrictions: No    Mobility  Bed Mobility               General bed mobility comments: sitting on side of bed on arrival  Transfers Overall transfer level: Needs assistance Equipment used: Pushed w/c Transfers: Squat Pivot Transfers     Squat pivot transfers: Min assist;+2 physical assistance;+2 safety/equipment     General transfer comment: min A +2 for safety from EOB to w/c; w/c angled and pulled closely to pt        Balance Overall balance assessment: Needs assistance Sitting-balance support: Feet supported;No upper extremity supported Sitting balance-Leahy Scale:  Fair                                      Cognition Arousal/Alertness: Awake/alert Behavior During Therapy: WFL for tasks assessed/performed Overall Cognitive Status: No family/caregiver present to determine baseline cognitive functioning                                 General Comments: unsure of baseline, pt with inconsistencies on home report. She appears to have decreased awareness of deficits and safety throughout eval      Exercises      General Comments General comments (skin integrity, edema, etc.): Pt with decreased use of R UE due to AVF yesterday. unable to fully extend and painful with weightbearing which is integral in her transfers      Pertinent Vitals/Pain Pain Assessment: Faces Faces Pain Scale: Hurts a little bit Pain Location: R antecubital region Pain Descriptors / Indicators: Sore;Aching;Guarding Pain Intervention(s): Limited activity within patient's tolerance;Monitored during session;Repositioned    Home Living Family/patient expects to be discharged to:: Private residence Living Arrangements: Children(dtr, granddtr does not live there but occasionally helps) Available Help at Discharge: Family;Available PRN/intermittently(dtr works during day) Type of Home: House Home Access: Level entry   Home Layout: One [place]: Environmental consultant - 2 wheels;Bedside commode;Wheelchair - manual Additional Comments: pt with inconsistencies on report from OT to PT. Pt shares that she puts some sort of chair in the  tub to shower with dtr, tells other she takes sponge baths    Prior Function Level of Independence: Needs assistance  Gait / Transfers Assistance Needed: pt shares she spends most of the day sitting on the side of her bed and squat pivots to [facility]. Dons prosthetic for w/c t/f when leaving house. Shares with OT dtr helps her transfer, tells PT she does it by herself ADL's / Homemaking Assistance Needed: dtr and granddtr assist with  bathing, dressing, IADL management     PT Goals (current goals can now be found in the care plan section) Acute Rehab PT Goals Patient Stated Goal: go home PT Goal Formulation: With patient Time For Goal Achievement: 08/25/19 Potential to Achieve Goals: Fair Progress towards PT goals: Progressing toward goals    Frequency    Min 2X/week      PT Plan Current plan remains appropriate    Co-evaluation PT/OT/SLP Co-Evaluation/Treatment: Yes Reason for Co-Treatment: For patient/therapist safety PT goals addressed during session: Mobility/safety with mobility OT goals addressed during session: ADL's and self-care;Strengthening/ROM      AM-PAC PT "6 Clicks" Mobility   Outcome Measure  Help needed turning from your back to your side while in a flat bed without using bedrails?: None Help needed moving from lying on your back to sitting on the side of a flat bed without using bedrails?: A Little Help needed moving to and from a bed to a chair (including a wheelchair)?: A Lot Help needed standing up from a chair using your arms (e.g., wheelchair or bedside chair)?: Total Help needed to walk in hospital room?: Total Help needed climbing 3-5 steps with a railing? : Total 6 Click Score: 12    End of Session Equipment Utilized During Treatment: Gait belt Activity Tolerance: Patient tolerated treatment well Patient left: in bed;with call bell/phone within reach;with bed alarm set Nurse Communication: Mobility status PT Visit Diagnosis: Other abnormalities of gait and mobility (R26.89);Muscle weakness (generalized) (M62.81);Difficulty in walking, not elsewhere classified (R26.2)     Time: 7544-9201 PT Time Calculation (min) (ACUTE ONLY): 22 min  Charges:  $Therapeutic Activity: 8-22 mins                     Adelei Scobey B. Migdalia Dk PT, DPT Acute Rehabilitation Services Pager [phone] Office [phone]    [place] 08/13/2019, 4:22 PM

## 2019-08-13 NOTE — Progress Notes (Addendum)
[facility] Progress Note   68 y.o.year-old with hx of PAD sp L BKA, CKD IV, CVA (2014), DM2, anemia presented to ED brought by EMS for hypoglycemia episode. Pt lives w/ her daughter at home and uses a WC. No tobacco/ etoh use. In ED pt was hypothermic and was therefore admitted. rec'd IV fluid bolus 500 cc, Rocephin x 1. K 5.4, creat 4.0. Assessment/ Plan:   1. CKD stage 4/5 - has had some vomiting which could have contributed to low BS's. Poor historian so not sure if this is acute or chronic vomiting issues. Could be uremic but no gross exam findings to support. She may be slightly dry, hold lasix and give gentle IVF's. Will get records from office in am. Patient use to follow w/ Dr Moshe Cipro. She has an AVF in L arm but is occluded (put in 2016). - Appreciate VVSseeing the pt; Dr. Carlis Abbott placed a rt BCF on 9/23.  - She doesn't have any absolute indications for dialysis but may be starting to exhibit some uremic sxs; would like to avoid initiating HD through a catheter.Likely to initiate HD in under 6 mths. She should be able to go home after surgery.  - Continue Lasix 80mg  PO BID as outpt.  - F/U with [place] in 2-4 weeks  2. Hypoglycemia - per primary, is on insulin 2 types  3. PAD sp L BKA 4. HTN - BP's 150/70, on coreg and lasix at home, continue 5. Hyperkalemia - renal diet, lokelma PRN 6. DM2 - per primary  Subjective:   Denies f/c/n/v.   Objective:   BP (!) 120/40 (BP Location: Left Leg)   Pulse 83   Temp 98.1 F (36.7 C) (Oral)   Resp 15   Ht 5\' 2"  (1.575 m)   Wt 94.7 kg   SpO2 96%   BMI 38.19 kg/m   Intake/Output Summary (Last 24 hours) at 08/13/2019 1239 Last data filed at 08/13/2019 1017 Gross per 24 hour  Intake 1500 ml  Output 700 ml  Net 800 ml   Weight change: -0.1 kg  Physical Exam: Genmorbidly obese, pleasant, lying flat, no distress No rash, cyanosis or gangrene Sclera anicteric, throat clear   No jvd or bruits Chest clear bilatto bases RRR no MRG Abd soft ntnd no mass or ascites +bsobese ExtL stump and RLE w/o anyedema, no wounds, skin turgor poor in LE's Neuro is alert, Ox2 Access: no bruit in the left arm, bruit present in rt BCF  Imaging: Vas [place] Upper Ext Vein Mapping (pre-op Avf)  Result Date: 08/11/2019 UPPER EXTREMITY VEIN MAPPING  Indications: Pre-access. Performing Technologist: June Leap RDMS, RVT  Examination Guidelines: A complete evaluation includes B-mode imaging, spectral Doppler, color Doppler, and power Doppler as needed of all accessible portions of each vessel. Bilateral testing is considered an integral part of a complete examination. Limited examinations for reoccurring indications may be performed as noted. +-----------------+-------------+----------+---------+ Right Cephalic   Diameter (cm)Depth (cm)Findings  +-----------------+-------------+----------+---------+ Shoulder             0.33        1.22             +-----------------+-------------+----------+---------+ Mid upper arm        0.34        0.50             +-----------------+-------------+----------+---------+ Dist upper arm       0.34        0.92   tortuous  +-----------------+-------------+----------+---------+  Antecubital fossa    0.36                         +-----------------+-------------+----------+---------+ Prox forearm         0.20               branching +-----------------+-------------+----------+---------+ Mid forearm          0.20                         +-----------------+-------------+----------+---------+ +-----------------+-------------+----------+---------+ Right Basilic    Diameter (cm)Depth (cm)Findings  +-----------------+-------------+----------+---------+ Mid upper arm        0.36        1.93             +-----------------+-------------+----------+---------+ Dist upper arm       0.36        1.80   branching  +-----------------+-------------+----------+---------+ Antecubital fossa    0.36                         +-----------------+-------------+----------+---------+ +-----------------+-------------+----------+---------+ Left Basilic     Diameter (cm)Depth (cm)Findings  +-----------------+-------------+----------+---------+ Mid upper arm        0.43        2.50             +-----------------+-------------+----------+---------+ Dist upper arm       0.37        1.72             +-----------------+-------------+----------+---------+ Antecubital fossa    0.26        1.40   branching +-----------------+-------------+----------+---------+ Previous AVF left arm. *See table(s) above for measurements and observations.  Diagnosing physician: Deitra Mayo MD Electronically signed by Deitra Mayo MD on 08/11/2019 at 4:14:07 PM.    Final     Labs: BMET Recent Labs  Lab 08/09/19 1224 08/10/19 0535 08/11/19 0332 08/12/19 0318  NA 139 142 140 142  K 5.4* 4.2 4.3 4.0  CL 107 109 108 113*  CO2 22 22 21* 20*  GLUCOSE 207* 183* 247* 144*  BUN 84* 82* 84* 77*  CREATININE 4.09* 4.50* 4.00* 3.72*  CALCIUM 9.3 9.2 8.6* 9.0  PHOS 5.1*  --  4.1  --    CBC Recent Labs  Lab 08/09/19 1224 08/10/19 0535 08/11/19 0332 08/12/19 0318  WBC 5.1 4.9 4.9 5.6  NEUTROABS 3.8  --   --   --   HGB 9.7* 8.2* 7.5* 7.8*  HCT 29.2* 24.3* 23.4* 24.1*  MCV 99.3 98.8 99.6 99.2  PLT 157 131* 125* 126*    Medications:    . atorvastatin  80 mg Oral q1800  . buPROPion  300 mg Oral Daily  . carvedilol  25 mg Oral Q12H  . clopidogrel  75 mg Oral Daily  . enoxaparin (LOVENOX) injection  30 mg Subcutaneous Q24H  . ferrous sulfate  325 mg Oral Q breakfast  . insulin aspart  0-9 Units Subcutaneous TID WC      Otelia Santee, MD 08/13/2019, 12:39 PM

## 2019-08-14 LAB — CULTURE, BLOOD (ROUTINE X 2)
Culture: NO GROWTH
Culture: NO GROWTH
Special Requests: ADEQUATE
Special Requests: ADEQUATE

## 2019-08-14 NOTE — Care Management (Signed)
08-14-19 1302 Late Entry: CM received call from the family stating that Flex Pen is $600.00 and will not be able to afford. CM did call pharmacy [facility] and the insurance is asking for prior authorization. CM did send text page to MD to see if he can assist with the prior authorization. No further needs from CM at this time. Bethena Roys, RN,BSN Case Manager [phone]

## 2019-08-16 ENCOUNTER — Other Ambulatory Visit: Payer: Self-pay | Admitting: Internal Medicine

## 2019-08-16 DIAGNOSIS — Z794 Long term (current) use of insulin: Secondary | ICD-10-CM

## 2019-08-16 DIAGNOSIS — E1169 Type 2 diabetes mellitus with other specified complication: Secondary | ICD-10-CM

## 2019-08-16 MED ORDER — "INSULIN SYRINGE 31G X 5/16"" 0.3 ML MISC": 1.0000 | Freq: Three times a day (TID) | 0 refills | Status: DC

## 2019-08-16 MED ORDER — INSULIN ASPART 100 UNIT/ML ~~LOC~~ SOLN: 0.0000 [IU] | Freq: Three times a day (TID) | SUBCUTANEOUS | 0 refills | Status: DC

## 2019-08-17 NOTE — Progress Notes (Signed)
Addendum: Initially received a phone call from 6 E. charge nurse on 08/16/2019, regarding patient's insulin prescription too costly. Discussed with daughter and family member on the phone on 08/16/2019.  Based on that initial discussion prescribed NovoLog vial as well as NovoLog syringes for the patient. After that, I called [place], discussed with the pharmacist regarding what prescriptions were filled in. Patient received and paid for lispro pen and pen needles for less than $10 each.  They were under the impression that the Humalog prescription that they received from the pharmacy is same as Humalog 50-50 mix that the patient was on before the admission. Clarified with the family that short acting Humalog and Humalog 50-50 at different medication and short acting Humalog and short acting NovoLog are interchangeable for the most part and based on the indication that the patient was discharged with. Still informed the family to verify with the pharmacist at [facility] to ensure that they have the right medication and they understand the prescription appropriately. Received fax today for prior authorization for insulin NovoLog as well as NovoLog syringes. Discussed with pharmacist on 08/17/2019.  This was a duplicate fill.  Patient's family did visit the pharmacy yesterday per the pharmacist, and at that time of leaving the pharmacy there was no confusion regarding Humalog versus NovoLog use. Informed the pharmacist to disregard the current prescription of NovoLog while in NovoLog syringes. Patient is going to follow-up with PCP who can prescribe further prescriptions.  Berle Mull 5:20 PM 08/17/2019

## 2019-08-18 ENCOUNTER — Ambulatory Visit: Payer: Medicare Other | Admitting: Cardiology

## 2019-09-16 ENCOUNTER — Other Ambulatory Visit: Payer: Self-pay

## 2019-09-16 DIAGNOSIS — N184 Chronic kidney disease, stage 4 (severe): Secondary | ICD-10-CM

## 2019-09-22 ENCOUNTER — Encounter (HOSPITAL_COMMUNITY): Payer: Medicare Other

## 2019-09-22 ENCOUNTER — Encounter: Payer: Medicare Other | Admitting: Vascular Surgery

## 2019-10-21 ENCOUNTER — Emergency Department (HOSPITAL_COMMUNITY)
Admission: EM | Admit: 2019-10-21 | Discharge: 2019-10-21 | Disposition: A | Discharge status: Home / Self Care | Payer: Medicare Other | Attending: Emergency Medicine | Admitting: Emergency Medicine

## 2019-10-21 ENCOUNTER — Other Ambulatory Visit: Payer: Self-pay

## 2019-10-21 ENCOUNTER — Emergency Department (HOSPITAL_COMMUNITY): Payer: Medicare Other

## 2019-10-21 DIAGNOSIS — I252 Old myocardial infarction: Secondary | ICD-10-CM | POA: Diagnosis not present

## 2019-10-21 DIAGNOSIS — I5032 Chronic diastolic (congestive) heart failure: Secondary | ICD-10-CM | POA: Insufficient documentation

## 2019-10-21 DIAGNOSIS — R0602 Shortness of breath: Secondary | ICD-10-CM | POA: Diagnosis present

## 2019-10-21 DIAGNOSIS — R05 Cough: Secondary | ICD-10-CM | POA: Diagnosis not present

## 2019-10-21 DIAGNOSIS — Z79899 Other long term (current) drug therapy: Secondary | ICD-10-CM | POA: Diagnosis not present

## 2019-10-21 DIAGNOSIS — N184 Chronic kidney disease, stage 4 (severe): Secondary | ICD-10-CM | POA: Insufficient documentation

## 2019-10-21 DIAGNOSIS — Z87891 Personal history of nicotine dependence: Secondary | ICD-10-CM | POA: Insufficient documentation

## 2019-10-21 DIAGNOSIS — R600 Localized edema: Secondary | ICD-10-CM | POA: Diagnosis not present

## 2019-10-21 DIAGNOSIS — Z7901 Long term (current) use of anticoagulants: Secondary | ICD-10-CM | POA: Insufficient documentation

## 2019-10-21 DIAGNOSIS — I130 Hypertensive heart and chronic kidney disease with heart failure and stage 1 through stage 4 chronic kidney disease, or unspecified chronic kidney disease: Secondary | ICD-10-CM | POA: Insufficient documentation

## 2019-10-21 DIAGNOSIS — Z794 Long term (current) use of insulin: Secondary | ICD-10-CM | POA: Insufficient documentation

## 2019-10-21 DIAGNOSIS — E1122 Type 2 diabetes mellitus with diabetic chronic kidney disease: Secondary | ICD-10-CM | POA: Diagnosis not present

## 2019-10-21 DIAGNOSIS — I509 Heart failure, unspecified: Secondary | ICD-10-CM

## 2019-10-21 LAB — BASIC METABOLIC PANEL
Anion gap: 12 (ref 5–15)
BUN: 77 mg/dL — ABNORMAL HIGH (ref 8–23)
CO2: 18 mmol/L — ABNORMAL LOW (ref 22–32)
Calcium: 9 mg/dL (ref 8.9–10.3)
Chloride: 111 mmol/L (ref 98–111)
Creatinine, Ser: 2.78 mg/dL — ABNORMAL HIGH (ref 0.44–1.00)
GFR calc Af Amer: 19 mL/min — ABNORMAL LOW (ref >60)
GFR calc non Af Amer: 17 mL/min — ABNORMAL LOW (ref >60)
Glucose, Bld: 271 mg/dL — ABNORMAL HIGH (ref 70–99)
Potassium: 3.6 mmol/L (ref 3.5–5.1)
Sodium: 141 mmol/L (ref 135–145)

## 2019-10-21 LAB — CBC
HCT: 26.8 % — ABNORMAL LOW (ref 36.0–46.0)
Hemoglobin: 8.3 g/dL — ABNORMAL LOW (ref 12.0–15.0)
MCH: 31.7 pg (ref 26.0–34.0)
MCHC: 31 g/dL (ref 30.0–36.0)
MCV: 102.3 fL — ABNORMAL HIGH (ref 80.0–100.0)
Platelets: 136 10*3/uL — ABNORMAL LOW (ref 150–400)
RBC: 2.62 MIL/uL — ABNORMAL LOW (ref 3.87–5.11)
RDW: 14.6 % (ref 11.5–15.5)
WBC: 5 10*3/uL (ref 4.0–10.5)
nRBC: 0 % (ref 0.0–0.2)

## 2019-10-21 MED ORDER — SODIUM CHLORIDE 0.9% FLUSH: 3.0000 mL | Freq: Once | INTRAVENOUS | Status: DC

## 2019-10-21 MED ORDER — FUROSEMIDE 10 MG/ML IJ SOLN
80.0000 mg | Freq: Once | INTRAMUSCULAR | Status: AC
  Administered 2019-10-21: 19:00:00 80 mg via INTRAVENOUS
  Filled 2019-10-21: qty 8

## 2019-10-21 NOTE — ED Notes (Signed)
IV team unable to obtain IV access.  Dr. Alvino Chapel made aware

## 2019-10-21 NOTE — ED Notes (Signed)
IV attempted x's 3 without success  IV team consult placed

## 2019-10-21 NOTE — ED Notes (Signed)
Instructions called and given to pt's daughter per pt's request

## 2019-10-21 NOTE — ED Provider Notes (Signed)
[facility] EMERGENCY DEPARTMENT Provider Note   CSN: [id number] Arrival date & time: 10/21/19  1348     History   Chief Complaint Chief Complaint  Patient presents with  . Shortness of Breath  . Leg Swelling    HPI [name] is a 68 y.o. female.     HPI Patient presents with shortness of breath.  Began a day or 2 ago.  Worse with lying down.  Also some swelling in her right leg.  No fevers.  No cough.  Not been around anyone sick.  No chest pain.  Has dialysis access on her right arm but is not on dialysis yet.  Patient is on Lasix but does not know if there is been any change in her medications.  States her daughter helps her take the medicines. Past Medical History:  Diagnosis Date  . Anemia   . Arthritis    "right shoulder" (12/19/2016)  . CAD S/P percutaneous coronary angioplasty 5/110    status post PCI to the RCA for inferior STEMI  . Cervical cancer ([place])   . Chronic diastolic heart failure, NYHA class 2 ([place])    Echocardiogram 06/2014:  Technically difficult study. Normal LV size with mild LVH. EF 55-60%. Moderate diastolic dysfunction, normal RV size and function. Likely aortic sclerosis without stenosis  . Chronic lower back pain   . CKD (chronic kidney disease), stage IV ([place])    Recent acute on chronic exacerbation in July 2014  . Depression   . Diabetes mellitus, type II, insulin dependent ([place])    With complications - CAD, CVA, peripheral ulcer ,  . Diabetic foot ulcer ([place])   . Diabetic peripheral neuropathy associated with type 2 diabetes mellitus ([place])   . 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 History of blood transfusion    "w/hysterectomy"   . Hyperlipidemia LDL goal <70   . Hypertension associated with diabetes ([place])   . Obesity, Class III, BMI 40-49.9 (morbid obesity) (HCC)    BMI 43; 5' 2',  235 pounds 6.4 ounces  . On home oxygen therapy    "2.5L prn; sometimes as little as  once/month" (12/19/2016)  . PAD (peripheral artery disease) ([place])     -- Most recent Dopplers April 2016 Status post left BKA. Known right PTA occlusion  . Pneumonia    "childhood"  . ST elevation myocardial infarction (STEMI) of inferior wall ([place])  03/2009   Ostial/proximal RCA occlusion treated with BMS stent  . Stroke/cerebrovascular accident ([place]) 03/18/2013; 12/19/2016   a. Right-sided weakness, mostly with balance issues and only mild weakness;; b. Slurring speach -- L Posterior Putamen CVA    Patient Active Problem List   Diagnosis Date Noted  . AKI (acute kidney injury) ([place]) 08/10/2019  . Hypoglycemia 08/09/2019  . Hyperkalemia 08/09/2019  . Lactic acidosis 08/09/2019  . Hypothermia 08/09/2019  . CVA (cerebral vascular accident) ([place]) 12/19/2016  . S/P BKA (below knee amputation) unilateral ([place]) 08/06/2014  . Chronic respiratory failure with hypoxia ([place]) 08/06/2014  . Essential hypertension 06/23/2014  . Chronic diastolic congestive heart failure, NYHA class 2 ([place]) 01/12/2014  . UTI (lower urinary tract infection) 01/11/2014  . Anemia in chronic kidney disease 09/25/2013  . Hyponatremia 08/06/2013  . Chronic kidney disease (CKD), stage IV (severe) ([place]) 08/06/2013  . Severe obesity (BMI >= 40) (HCC) 06/22/2013    Class: Chronic  . Peripheral arterial disease: s/p  L BKA; Occluded R Pop A, 2 V runnoff 05/01/2013  . Hyperlipidemia LDL goal <70   . Hypertension associated with diabetes ([place])   . Unspecified constipation 04/16/2013  . Type 2 diabetes mellitus with renal manifestations ([place]) 04/16/2013  . Secondary renovascular hypertension, benign 04/09/2013  . Nausea and vomiting 03/18/2013  . Anemia of chronic disease 03/18/2013  . CVA (cerebral infarction) 03/18/2013  . Non-ketotic hyperosmolar coma ([place]) 03/18/2013  . CAD S/P PCI TO RCA for Inferior STEMI. BMS 03/31/2009  . Status post myocardial infarction of inferior wall 03/31/2009    Past Surgical History:   Procedure Laterality Date  . ABDOMINAL HYSTERECTOMY    . AMPUTATION Left 08/07/2013   Procedure: left midfoot amputation;  Surgeon: Marianna Payment, MD;  Location: [facility];  Service: Orthopedics;  Laterality: Left;  . AMPUTATION Left 08/09/2013   Procedure: AMPUTATION BELOW KNEE;  Surgeon: Marianna Payment, MD;  Location: [facility];  Service: Orthopedics;  Laterality: Left;  . AV FISTULA PLACEMENT Left 12/03/2014   Procedure: ARTERIOVENOUS (AV) FISTULA CREATION;  Surgeon: Angelia Mould, MD;  Location: [facility];  Service: Vascular;  Laterality: Left;  . AV FISTULA PLACEMENT Right 08/12/2019   Procedure: ARTERIOVENOUS (AV) FISTULA CREATION;  Surgeon: Marty Heck, MD;  Location: [facility];  Service: Vascular;  Laterality: Right;  . CARDIAC CATHETERIZATION  03/31/2009   Proximal RCA thrombotic occlusion (inferior STEMI) other coronaries but in a codominant system  . EYE SURGERY     "bleeding; vessels leaking"  . [place]   "broke her leg in 2 places when she was young"  . LEG SURGERY     hole in bone( during Childhood)  . LEG TENDON SURGERY Right 2000s   patient fell @ [facility]  . PERCUTANEOUS CORONARY STENT INTERVENTION (PCI-S)  03/31/2009   PTCA , bare-metal stent 3.5 mm x 24 mm ostium of RCA ,EF 55%  . TRANSTHORACIC ECHOCARDIOGRAM  06/2014; 12/2016   a. Technically difficult study. Normal LV size with mild LVH. EF 55-60%. Mod - GR 2 DD. Nl RV size & fxn. ~ Aortic Sclerosis w/ stenosis;; b. LVEF 65-70%, GR 1-2 DD. No RWMA, Mod LA Dilation     OB History   No obstetric history on file.      Home Medications    Prior to Admission medications   Medication Sig Start Date End Date Taking? Authorizing Provider  atorvastatin (LIPITOR) 80 MG tablet Take 1 tablet (80 mg total) by mouth daily at 6 PM. 12/20/16  Yes Svalina, Estill Dooms, MD  buPROPion (WELLBUTRIN XL) 300 MG 24 hr tablet Take 300 mg by mouth daily.    [provider]  carvedilol (COREG) 25 MG tablet Take 25  mg by mouth every 12 (twelve) hours.  11/23/14   [provider]  clopidogrel (PLAVIX) 75 MG tablet Take 1 tablet (75 mg total) by mouth daily. 12/20/16   Alphonzo Grieve, MD  colchicine 0.6 MG tablet Take 0.6 mg by mouth daily.  03/19/18   [provider]  ezetimibe (ZETIA) 10 MG tablet Take 1 tablet (10 mg total) by mouth daily. Patient not taking: Reported on 08/09/2019 02/21/17 05/22/17  Leonie Man, MD  ferrous sulfate 325 (65 FE) MG tablet Take 325 mg by mouth daily with breakfast.    [provider]  furosemide (LASIX) 40 MG tablet Take 80 mg by mouth 2 (two) times daily.  08/10/14   Verlee Monte, MD  glucose blood (ACCU-CHEK AVIVA PLUS) test strip  USE ONE STRIP TO CHECK GLUCOSE BEFORE MEALS AND AT BEDTIME 06/18/14   [provider]  insulin aspart (NOVOLOG) 100 UNIT/ML injection Inject 0-9 Units into the skin 3 (three) times daily before meals. 0-9 Units, Subcutaneous, 3 times daily with meals CBG < 70: Implement Hypoglycemia measures CBG 70 - 120: 0 units CBG 121 - 150: 1 unit CBG 151 - 200: 2 units CBG 201 - 250: 3 units CBG 251 - 300: 5 units CBG 301 - 350: 7 units CBG 351 - 400: 9 units CBG > 400: call MD 08/16/19   Lavina Hamman, MD  Insulin Syringe-Needle U-100 (INSULIN SYRINGE .3CC/31GX5/16") 31G X 5/16" 0.3 ML MISC 1 Act by Does not apply route 4 (four) times daily -  before meals and at bedtime. 08/16/19   Lavina Hamman, MD  metaxalone (SKELAXIN) 800 MG tablet Take 0.5-1 tablets (400-800 mg total) by mouth 3 (three) times daily. Patient taking differently: Take 400-800 mg by mouth 3 (three) times daily as needed for muscle spasms.  07/02/17   Lysbeth Penner, FNP  Omega-3 Fatty Acids (FISH OIL) 1000 MG CAPS Take 1,000 mg by mouth 2 (two) times daily.    [provider]  OXYGEN Inhale 2.5 L into the lungs as needed.     [provider]    Family History Family History  Problem Relation Age of Onset  . Alcoholism Mother        Died  from complications  . Alcoholism Father        Died from complications  . Cancer Father        Throat  . Pneumonia Father   . Diabetes Father   . Hypertension Father   . Heart disease Other        Unknown, unclear. Patient is not a good historian.  Essentially no pertinent history known    Social History Social History   Tobacco Use  . Smoking status: Former Smoker    Quit date: 04/29/1995    Years since quitting: 24.4  . Smokeless tobacco: Never Used  Substance Use Topics  . Alcohol use: No  . Drug use: No     Allergies   Patient has no known allergies.   Review of Systems Review of Systems  Constitutional: Negative for appetite change.  HENT: Negative for congestion.   Respiratory: Positive for cough and shortness of breath.   Cardiovascular: Negative for chest pain.  Gastrointestinal: Negative for abdominal pain.  Genitourinary: Negative for dysuria.  Musculoskeletal: Negative for back pain.  Neurological: Negative for weakness.     Physical Exam Updated Vital Signs BP (!) 189/66 (BP Location: Right Arm)   Pulse 73   Temp 98.4 F (36.9 C) (Oral)   Resp 18   SpO2 94%   Physical Exam Vitals signs and nursing note reviewed.  HENT:     Head: Atraumatic.  Cardiovascular:     Rate and Rhythm: Normal rate.  Pulmonary:     Comments: Mildly harsh breath sounds with some dyspnea. Chest:     Chest wall: No tenderness.  Musculoskeletal:     Comments: Previous left below the knee amputation.  Moderate edema to right lower leg.  Skin:    Capillary Refill: Capillary refill takes less than 2 seconds.  Neurological:     Comments: Awake and pleasant, but may have some confusion.      ED Treatments / Results  Labs (all labs ordered are listed, but only abnormal results are displayed)  Labs Reviewed  BASIC METABOLIC PANEL - Abnormal; Notable for the following components:      Result Value   CO2 18 (*)    Glucose, Bld 271 (*)    BUN 77 (*)    Creatinine,  Ser 2.78 (*)    GFR calc non Af Amer 17 (*)    GFR calc Af Amer 19 (*)    All other components within normal limits  CBC - Abnormal; Notable for the following components:   RBC 2.62 (*)    Hemoglobin 8.3 (*)    HCT 26.8 (*)    MCV 102.3 (*)    Platelets 136 (*)    All other components within normal limits    EKG EKG Interpretation  Date/Time:  Wednesday October 21 2019 16:55:55 EST Ventricular Rate:  74 PR Interval:    QRS Duration: 89 QT Interval:  416 QTC Calculation: 462 R Axis:   20 Text Interpretation: Sinus rhythm Borderline T abnormalities, diffuse leads Confirmed by Davonna Belling [phone]) on 10/21/2019 6:36:29 PM   Radiology Dg Chest 2 View  Result Date: 10/21/2019 CLINICAL DATA:  Shortness of breath. EXAM: CHEST - 2 VIEW COMPARISON:  Chest x-rays dated 08/09/2019 and 06/23/2014 FINDINGS: Stable cardiomegaly. Small bilateral pleural effusions. New slight haziness in both lungs consistent with mild pulmonary edema. Aortic atherosclerosis. No significant bone abnormality. IMPRESSION: 1. New mild pulmonary edema with small bilateral pleural effusions. 2. Stable cardiomegaly. 3.  Aortic Atherosclerosis (ICD10-I70.0). Electronically Signed   By: Lorriane Shire M.D.   On: 10/21/2019 14:37    Procedures Procedures (including critical care time)  Medications Ordered in ED Medications  sodium chloride flush (NS) 0.9 % injection 3 mL (has no administration in time range)  furosemide (LASIX) injection 80 mg (80 mg Intravenous Given 10/21/19 1926)     Initial Impression / Assessment and Plan / ED Course  I have reviewed the triage vital signs and the nursing notes.  Pertinent labs & imaging results that were available during my care of the patient were reviewed by me and considered in my medical decision making (see chart for details).        Patient with shortness of breath.  History of heart failure.  X-ray shows some pulmonary edema.  However patient feels much  better after IV Lasix.  Think she can manage this at home.  Already on a relatively high dose of Lasix 80 twice a day.  Will have patient contact her PCP tomorrow about adjusting her medicines.  Discharge home.  Final Clinical Impressions(s) / ED Diagnoses   Final diagnoses:  Acute on chronic congestive heart failure, unspecified heart failure type [facility])    ED Discharge Orders    None       Davonna Belling, MD 10/21/19 2242

## 2019-10-21 NOTE — Discharge Instructions (Signed)
You have too much fluid on your lungs.  You seem improved after the IV Lasix today but call your primary care doctor about potentially adjusting your medications for the next few days to get more fluid off.

## 2019-10-21 NOTE — ED Triage Notes (Signed)
Pt reports 2 days of SOB and leg swelling. Denies recent fevers. VSS. NAd at present.

## 2020-02-03 ENCOUNTER — Other Ambulatory Visit: Payer: Self-pay

## 2020-02-03 ENCOUNTER — Emergency Department (HOSPITAL_COMMUNITY): Payer: Medicare Other

## 2020-02-03 ENCOUNTER — Emergency Department (HOSPITAL_COMMUNITY)
Admission: EM | Admit: 2020-02-03 | Discharge: 2020-02-03 | Disposition: A | Discharge status: Home / Self Care | Payer: Medicare Other | Attending: Emergency Medicine | Admitting: Emergency Medicine

## 2020-02-03 DIAGNOSIS — Z79899 Other long term (current) drug therapy: Secondary | ICD-10-CM | POA: Diagnosis not present

## 2020-02-03 DIAGNOSIS — E1122 Type 2 diabetes mellitus with diabetic chronic kidney disease: Secondary | ICD-10-CM | POA: Diagnosis not present

## 2020-02-03 DIAGNOSIS — I5032 Chronic diastolic (congestive) heart failure: Secondary | ICD-10-CM | POA: Insufficient documentation

## 2020-02-03 DIAGNOSIS — I252 Old myocardial infarction: Secondary | ICD-10-CM | POA: Insufficient documentation

## 2020-02-03 DIAGNOSIS — Z8541 Personal history of malignant neoplasm of cervix uteri: Secondary | ICD-10-CM | POA: Insufficient documentation

## 2020-02-03 DIAGNOSIS — Z89512 Acquired absence of left leg below knee: Secondary | ICD-10-CM | POA: Insufficient documentation

## 2020-02-03 DIAGNOSIS — Z8673 Personal history of transient ischemic attack (TIA), and cerebral infarction without residual deficits: Secondary | ICD-10-CM | POA: Insufficient documentation

## 2020-02-03 DIAGNOSIS — Y998 Other external cause status: Secondary | ICD-10-CM | POA: Insufficient documentation

## 2020-02-03 DIAGNOSIS — I2510 Atherosclerotic heart disease of native coronary artery without angina pectoris: Secondary | ICD-10-CM | POA: Diagnosis not present

## 2020-02-03 DIAGNOSIS — Z9981 Dependence on supplemental oxygen: Secondary | ICD-10-CM | POA: Diagnosis not present

## 2020-02-03 DIAGNOSIS — E1140 Type 2 diabetes mellitus with diabetic neuropathy, unspecified: Secondary | ICD-10-CM | POA: Insufficient documentation

## 2020-02-03 DIAGNOSIS — S8992XA Unspecified injury of left lower leg, initial encounter: Secondary | ICD-10-CM | POA: Diagnosis present

## 2020-02-03 DIAGNOSIS — Z794 Long term (current) use of insulin: Secondary | ICD-10-CM | POA: Diagnosis not present

## 2020-02-03 DIAGNOSIS — Y9389 Activity, other specified: Secondary | ICD-10-CM | POA: Diagnosis not present

## 2020-02-03 DIAGNOSIS — N189 Chronic kidney disease, unspecified: Secondary | ICD-10-CM

## 2020-02-03 DIAGNOSIS — S81802A Unspecified open wound, left lower leg, initial encounter: Secondary | ICD-10-CM

## 2020-02-03 DIAGNOSIS — Z87891 Personal history of nicotine dependence: Secondary | ICD-10-CM | POA: Diagnosis not present

## 2020-02-03 DIAGNOSIS — I509 Heart failure, unspecified: Secondary | ICD-10-CM

## 2020-02-03 DIAGNOSIS — I130 Hypertensive heart and chronic kidney disease with heart failure and stage 1 through stage 4 chronic kidney disease, or unspecified chronic kidney disease: Secondary | ICD-10-CM | POA: Diagnosis not present

## 2020-02-03 DIAGNOSIS — X58XXXA Exposure to other specified factors, initial encounter: Secondary | ICD-10-CM | POA: Insufficient documentation

## 2020-02-03 DIAGNOSIS — N184 Chronic kidney disease, stage 4 (severe): Secondary | ICD-10-CM | POA: Insufficient documentation

## 2020-02-03 DIAGNOSIS — Y929 Unspecified place or not applicable: Secondary | ICD-10-CM | POA: Diagnosis not present

## 2020-02-03 LAB — CBC
HCT: 32.3 % — ABNORMAL LOW (ref 36.0–46.0)
Hemoglobin: 9.6 g/dL — ABNORMAL LOW (ref 12.0–15.0)
MCH: 31.7 pg (ref 26.0–34.0)
MCHC: 29.7 g/dL — ABNORMAL LOW (ref 30.0–36.0)
MCV: 106.6 fL — ABNORMAL HIGH (ref 80.0–100.0)
Platelets: 136 10*3/uL — ABNORMAL LOW (ref 150–400)
RBC: 3.03 MIL/uL — ABNORMAL LOW (ref 3.87–5.11)
RDW: 15.1 % (ref 11.5–15.5)
WBC: 4.8 10*3/uL (ref 4.0–10.5)
nRBC: 0 % (ref 0.0–0.2)

## 2020-02-03 LAB — BASIC METABOLIC PANEL
Anion gap: 12 (ref 5–15)
BUN: 56 mg/dL — ABNORMAL HIGH (ref 8–23)
CO2: 21 mmol/L — ABNORMAL LOW (ref 22–32)
Calcium: 9.1 mg/dL (ref 8.9–10.3)
Chloride: 110 mmol/L (ref 98–111)
Creatinine, Ser: 3.5 mg/dL — ABNORMAL HIGH (ref 0.44–1.00)
GFR calc Af Amer: 15 mL/min — ABNORMAL LOW (ref >60)
GFR calc non Af Amer: 13 mL/min — ABNORMAL LOW (ref >60)
Glucose, Bld: 168 mg/dL — ABNORMAL HIGH (ref 70–99)
Potassium: 4.2 mmol/L (ref 3.5–5.1)
Sodium: 143 mmol/L (ref 135–145)

## 2020-02-03 LAB — CBG MONITORING, ED: Glucose-Capillary: 147 mg/dL — ABNORMAL HIGH (ref 70–99)

## 2020-02-03 LAB — BRAIN NATRIURETIC PEPTIDE: B Natriuretic Peptide: 1551.5 pg/mL — ABNORMAL HIGH (ref 0.0–100.0)

## 2020-02-03 MED ORDER — BACITRACIN ZINC 500 UNIT/GM EX OINT
1.0000 "application " | TOPICAL_OINTMENT | Freq: Once | CUTANEOUS | Status: AC
  Administered 2020-02-03: 1 via TOPICAL
  Filled 2020-02-03: qty 0.9

## 2020-02-03 MED ORDER — SODIUM CHLORIDE 0.9% FLUSH: 3.0000 mL | Freq: Once | INTRAVENOUS | Status: DC

## 2020-02-03 MED ORDER — FUROSEMIDE 10 MG/ML IJ SOLN
80.0000 mg | Freq: Once | INTRAMUSCULAR | Status: DC
  Filled 2020-02-03: qty 8

## 2020-02-03 MED ORDER — FUROSEMIDE 10 MG/ML IJ SOLN
80.0000 mg | Freq: Once | INTRAMUSCULAR | Status: AC
  Administered 2020-02-03: 80 mg via INTRAMUSCULAR

## 2020-02-03 NOTE — ED Notes (Addendum)
Pt assisted into taxi with tech and daughter. Offered PTAR transport home due to pt being unable to move much, daughter declined. Discharge paperwork reviewed with daughter

## 2020-02-03 NOTE — ED Notes (Signed)
Patient verbalizes understanding of discharge instructions. Opportunity for questioning and answers were provided. Armband removed by staff, pt discharged from ED by wheelchair with daughter

## 2020-02-03 NOTE — ED Notes (Signed)
[name](Daughter#[phone]) called/would like to come visit her mother once in the room.

## 2020-02-03 NOTE — Plan of Care (Signed)
[facility] at Home  Consult Note  Chief Complaint: [facility]  History of Present Illness: [name] is a 69 y.o female with CAD< HFpEF, CKD Stage IV, DM, and HTN who presented to the ED as the urging of her PCP. She personal does not have any concerns. Her daughter has noticed progressive SHOB and a LE wound. Her SHOB seems to be worse with exertion. Given her progressive DOE her PCP felt it would be best for her to come to the ED. Per the EDP she has evidence of pitting edema on her right LE (s/p left BKA). Her lab results illustrate and elevated BNP but otherwise stable renal function. CXR was consistent with pulmonary edema. She was recommended admission for IV diuretics. Staff discuss the hospital at home program with the patient (and her daughter) and she agrees with enrollment.  Meds:  Current Meds  Medication Sig  . atorvastatin (LIPITOR) 80 MG tablet Take 1 tablet (80 mg total) by mouth daily at 6 PM.  . buPROPion (WELLBUTRIN XL) 300 MG 24 hr tablet Take 300 mg by mouth daily.  . carvedilol (COREG) 25 MG tablet Take 25 mg by mouth every 12 (twelve) hours.   . clopidogrel (PLAVIX) 75 MG tablet Take 1 tablet (75 mg total) by mouth daily.  . colchicine 0.6 MG tablet Take 0.6 mg by mouth daily.   . febuxostat (ULORIC) 40 MG tablet Take 40 mg by mouth daily.  . furosemide (LASIX) 40 MG tablet Take 80 mg by mouth 2 (two) times daily.   Marland Kitchen glucose blood (ACCU-CHEK AVIVA PLUS) test strip 1 each by Other route See admin instructions. Use one strip to check glucose before meals and at bedtime.  . insulin lispro (HUMALOG) 100 UNIT/ML KwikPen Inject 0-9 Units into the skin 3 (three) times daily.   . Insulin Syringe-Needle U-100 (INSULIN SYRINGE .3CC/31GX5/16") 31G X 5/16" 0.3 ML MISC 1 Act by Does not apply route 4 (four) times daily -  before meals and at bedtime. (Patient taking differently: 1 each by Other route 4 (four) times daily -  before meals and at bedtime. )  . Omega-3 Fatty Acids (FISH  OIL) 1000 MG CAPS Take 1,000 mg by mouth 2 (two) times daily.  . OXYGEN Inhale 2.5 L into the lungs as needed (Breathing.).   Marland Kitchen torsemide (DEMADEX) 5 MG tablet Take 5 mg by mouth in the morning and at bedtime.   Clinical Screening: (ALL ANSWERS MUST BE NO)  Based on current presentation is the patient likely to require: advanced diagnostics, advanced imaging (CT, MRI, nuclear stress testing), cardiac catheterization, EGD/colonoscopy, or lab monitoring not amendable to home monitoring (troponin, >q12 hour labs): NO.  Based on current presentation is the patient is likely to require: mechanical ventilation (invasive and noninvasive, history of intubation) and/or vasoactive medications: NO.  Based on current presentation is the patient likely to require a surgical or IR procedure including but not limited to intraabdominal abscess drainage, percutaneous nephrostomy tube placement, thoracentesis for parapneumonic effusion, surgical wound debridement: NO.  Based on current presentation is the patient is likely to require: daily specialty consultation, blood transfusions, respiratory isolation/airborne precautions, active adjustments of opiates or IV pain medications: NO.  Does the patient have barriers that would make it unsafe to provide care in the home including but not limited to severe AMS, active substance use disorder, history of or high risk of noncompliance with primary treatment plan: NO.  Has the patient ever been intubated or do they have a  new tracheostomy: NO.  Does the patient have an unstable arrhythmia: NO.  Is hemodialysis likely to be required (i.e. already on HD or newly anuric/sever ATN): NO.  Is there risk for inability to obtain IV access: NO.]  Social Screening: (ALL QUESTIONS MUST BE YES) Does the patient have a home recovery environment? YES.  Is the patient's home recovery environment in an eligible geography [facility])? YES.  Does the patient's home have  water, electricity, bathroom, heat/ac, refrigerator? YES.  Does the patient feel safe at home? YES.  Are family/caregivers willing to participate, as needed, while the patient participates [facility].  Is there a person in the home (patient or other) willing/able to take vital signs and answer phone calls? YES.  Is the patient willing to put pets in a secure area while [facility] and affiliated staff are in the home? YES.  Is patient willing/able to participate in the [facility] (this includes [facility] affiliated staff entering the home, and associated services)? YES.  Is the patient/patient's HCP willing/able to sign consent? YES.  Assessment / Plan:  Based on the HPI and information obtained the patient is a candidate for the [facility]. Consent has been signed and the patient has been provided with a copy.   [facility] has been notified and will present to the patient's house tomorrow morning.   Home health / DME needs: None  Medication needs: None  Other needs: None  Patient's contact information:  Phone: [phone], daughter) Address: [place],  [place] [address]  Please do not hesitate to call with questions/concerns.   Ina Homes, MD 02/03/2020, 7:54 PM  Pager: [phone]

## 2020-02-03 NOTE — ED Triage Notes (Signed)
Pt's family called EMS for patient to be evaluated for existing CHF; however patient has no complaints. Stage for CKD, hasn't started dialysis yet. Pt denies cp, shob. Feels like she's in her usual state of health.

## 2020-02-03 NOTE — Discharge Instructions (Addendum)
Follow up with the heart failure team that will see you at your home tomorrow.  They will manage your treatment

## 2020-02-03 NOTE — ED Provider Notes (Addendum)
[facility] EMERGENCY DEPARTMENT Provider Note   CSN: [id number] Arrival date & time: 02/03/20  1527     History Chief Complaint  Patient presents with  . Congestive Heart Failure    [name] is a 69 y.o. female.  HPI   Patient presented to the ED for evaluation of of congestive heart failure.  Patient has a history of chronic kidney disease and congestive heart failure.  She has a fistula but is not on dialysis.  Patient was seen by her primary care doctor who instructed her to come to the ED for evaluation.  Patient herself denies any complaints.  She denies having any chest pain or shortness of breath.  However, her daughter states that she has been having difficulty with shortness of breath.  It increases with activity.  She saw her doctor who was concerned about her breathing issues and her laboratory tests and felt she need to come to the hospital for evaluation.  Past Medical History:  Diagnosis Date  . Anemia   . Arthritis    "right shoulder" (12/19/2016)  . CAD S/P percutaneous coronary angioplasty 5/110    status post PCI to the RCA for inferior STEMI  . Cervical cancer ([place])   . Chronic diastolic heart failure, NYHA class 2 (Home)    Echocardiogram 06/2014:  Technically difficult study. Normal LV size with mild LVH. EF 55-60%. Moderate diastolic dysfunction, normal RV size and function. Likely aortic sclerosis without stenosis  . Chronic lower back pain   . CKD (chronic kidney disease), stage IV ([place])    Recent acute on chronic exacerbation in July 2014  . Depression   . Diabetes mellitus, type II, insulin dependent ([place])    With complications - CAD, CVA, peripheral ulcer ,  . Diabetic foot ulcer ([place])   . Diabetic peripheral neuropathy associated with type 2 diabetes mellitus ([place])   . 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 History of blood transfusion    "w/hysterectomy"   . Hyperlipidemia LDL goal <70     . Hypertension associated with diabetes ([place])   . Obesity, Class III, BMI 40-49.9 (morbid obesity) (HCC)    BMI 43; 5' 2',  235 pounds 6.4 ounces  . On home oxygen therapy    "2.5L prn; sometimes as little as once/month" (12/19/2016)  . PAD (peripheral artery disease) ([place])     -- Most recent Dopplers April 2016 Status post left BKA. Known right PTA occlusion  . Pneumonia    "childhood"  . ST elevation myocardial infarction (STEMI) of inferior wall ([place])  03/2009   Ostial/proximal RCA occlusion treated with BMS stent  . Stroke/cerebrovascular accident ([place]) 03/18/2013; 12/19/2016   a. Right-sided weakness, mostly with balance issues and only mild weakness;; b. Slurring speach -- L Posterior Putamen CVA    Patient Active Problem List   Diagnosis Date Noted  . AKI (acute kidney injury) ([place]) 08/10/2019  . Hypoglycemia 08/09/2019  . Hyperkalemia 08/09/2019  . Lactic acidosis 08/09/2019  . Hypothermia 08/09/2019  . CVA (cerebral vascular accident) ([place]) 12/19/2016  . S/P BKA (below knee amputation) unilateral ([place]) 08/06/2014  . Chronic respiratory failure with hypoxia ([place]) 08/06/2014  . Essential hypertension 06/23/2014  . Chronic diastolic congestive heart failure, NYHA class 2 ([place]) 01/12/2014  . UTI (lower urinary tract infection) 01/11/2014  . Anemia in chronic kidney disease 09/25/2013  . Hyponatremia 08/06/2013  . Chronic kidney  disease (CKD), stage IV (severe) ([place]) 08/06/2013  . Severe obesity (BMI >= 40) (HCC) 06/22/2013    Class: Chronic  . Peripheral arterial disease: s/p L BKA; Occluded R Pop A, 2 V runnoff 05/01/2013  . Hyperlipidemia LDL goal <70   . Hypertension associated with diabetes ([place])   . Unspecified constipation 04/16/2013  . Type 2 diabetes mellitus with renal manifestations ([place]) 04/16/2013  . Secondary renovascular hypertension, benign 04/09/2013  . Nausea and vomiting 03/18/2013  . Anemia of chronic disease 03/18/2013  . CVA (cerebral infarction)  03/18/2013  . Non-ketotic hyperosmolar coma ([place]) 03/18/2013  . CAD S/P PCI TO RCA for Inferior STEMI. BMS 03/31/2009  . Status post myocardial infarction of inferior wall 03/31/2009    Past Surgical History:  Procedure Laterality Date  . ABDOMINAL HYSTERECTOMY    . AMPUTATION Left 08/07/2013   Procedure: left midfoot amputation;  Surgeon: Marianna Payment, MD;  Location: [facility];  Service: Orthopedics;  Laterality: Left;  . AMPUTATION Left 08/09/2013   Procedure: AMPUTATION BELOW KNEE;  Surgeon: Marianna Payment, MD;  Location: [facility];  Service: Orthopedics;  Laterality: Left;  . AV FISTULA PLACEMENT Left 12/03/2014   Procedure: ARTERIOVENOUS (AV) FISTULA CREATION;  Surgeon: Angelia Mould, MD;  Location: [facility];  Service: Vascular;  Laterality: Left;  . AV FISTULA PLACEMENT Right 08/12/2019   Procedure: ARTERIOVENOUS (AV) FISTULA CREATION;  Surgeon: Marty Heck, MD;  Location: [facility];  Service: Vascular;  Laterality: Right;  . CARDIAC CATHETERIZATION  03/31/2009   Proximal RCA thrombotic occlusion (inferior STEMI) other coronaries but in a codominant system  . EYE SURGERY     "bleeding; vessels leaking"  . Colon   "broke her leg in 2 places when she was young"  . LEG SURGERY     hole in bone( during Childhood)  . LEG TENDON SURGERY Right 2000s   patient fell @ [facility]  . PERCUTANEOUS CORONARY STENT INTERVENTION (PCI-S)  03/31/2009   PTCA , bare-metal stent 3.5 mm x 24 mm ostium of RCA ,EF 55%  . TRANSTHORACIC ECHOCARDIOGRAM  06/2014; 12/2016   a. Technically difficult study. Normal LV size with mild LVH. EF 55-60%. Mod - GR 2 DD. Nl RV size & fxn. ~ Aortic Sclerosis w/ stenosis;; b. LVEF 65-70%, GR 1-2 DD. No RWMA, Mod LA Dilation     OB History   No obstetric history on file.     Family History  Problem Relation Age of Onset  . Alcoholism Mother        Died from complications  . Alcoholism Father        Died from complications  . Cancer Father          Throat  . Pneumonia Father   . Diabetes Father   . Hypertension Father   . Heart disease Other        Unknown, unclear. Patient is not a good historian.  Essentially no pertinent history known    Social History   Tobacco Use  . Smoking status: Former Smoker    Quit date: 04/29/1995    Years since quitting: 24.7  . Smokeless tobacco: Never Used  Substance Use Topics  . Alcohol use: No  . Drug use: No    Home Medications Prior to Admission medications   Medication Sig Start Date End Date Taking? Authorizing Provider  atorvastatin (LIPITOR) 80 MG tablet Take 1 tablet (80 mg total) by mouth daily at 6 PM. 12/20/16  Yes [place],  Estill Dooms, MD  buPROPion (WELLBUTRIN XL) 300 MG 24 hr tablet Take 300 mg by mouth daily.   Yes [provider]  carvedilol (COREG) 25 MG tablet Take 25 mg by mouth every 12 (twelve) hours.  11/23/14  Yes [provider]  clopidogrel (PLAVIX) 75 MG tablet Take 1 tablet (75 mg total) by mouth daily. 12/20/16  Yes Alphonzo Grieve, MD  colchicine 0.6 MG tablet Take 0.6 mg by mouth daily.  03/19/18  Yes [provider]  febuxostat (ULORIC) 40 MG tablet Take 40 mg by mouth daily. 01/06/20  Yes [provider]  furosemide (LASIX) 40 MG tablet Take 80 mg by mouth 2 (two) times daily.  08/10/14  Yes Verlee Monte, MD  glucose blood (ACCU-CHEK AVIVA PLUS) test strip 1 each by Other route See admin instructions. Use one strip to check glucose before meals and at bedtime. 06/18/14  Yes [provider]  insulin lispro (HUMALOG) 100 UNIT/ML KwikPen Inject 0-9 Units into the skin 3 (three) times daily.  08/14/19  Yes [provider]  Insulin Syringe-Needle U-100 (INSULIN SYRINGE .3CC/31GX5/16") 31G X 5/16" 0.3 ML MISC 1 Act by Does not apply route 4 (four) times daily -  before meals and at bedtime. Patient taking differently: 1 each by Other route 4 (four) times daily -  before meals and at bedtime.  08/16/19  Yes Lavina Hamman, MD   Omega-3 Fatty Acids (FISH OIL) 1000 MG CAPS Take 1,000 mg by mouth 2 (two) times daily.   Yes [provider]  OXYGEN Inhale 2.5 L into the lungs as needed (Breathing.).    Yes [provider]  torsemide (DEMADEX) 5 MG tablet Take 5 mg by mouth in the morning and at bedtime. 11/30/19  Yes [provider]  ezetimibe (ZETIA) 10 MG tablet Take 1 tablet (10 mg total) by mouth daily. Patient not taking: Reported on 08/09/2019 02/21/17 05/22/17  Leonie Man, MD  insulin aspart (NOVOLOG) 100 UNIT/ML injection Inject 0-9 Units into the skin 3 (three) times daily before meals. 0-9 Units, Subcutaneous, 3 times daily with meals CBG < 70: Implement Hypoglycemia measures CBG 70 - 120: 0 units CBG 121 - 150: 1 unit CBG 151 - 200: 2 units CBG 201 - 250: 3 units CBG 251 - 300: 5 units CBG 301 - 350: 7 units CBG 351 - 400: 9 units CBG > 400: call MD Patient not taking: Reported on 02/03/2020 08/16/19   Lavina Hamman, MD  metaxalone (SKELAXIN) 800 MG tablet Take 0.5-1 tablets (400-800 mg total) by mouth 3 (three) times daily. Patient not taking: Reported on 02/03/2020 07/02/17   Lysbeth Penner, FNP    Allergies    Patient has no known allergies.  Review of Systems   Review of Systems  All other systems reviewed and are negative.   Physical Exam Updated Vital Signs BP (!) 177/69   Pulse 61   Temp 98.4 F (36.9 C)   Resp 19   SpO2 94%   Physical Exam Vitals and nursing note reviewed.  Constitutional:      General: She is not in acute distress.    Appearance: She is well-developed.  HENT:     Head: Normocephalic and atraumatic.     Right Ear: External ear normal.     Left Ear: External ear normal.  Eyes:     General: No scleral icterus.       Right eye: No discharge.  Left eye: No discharge.     Conjunctiva/sclera: Conjunctivae normal.  Neck:     Trachea: No tracheal deviation.  Cardiovascular:     Rate and Rhythm: Normal rate and regular rhythm.   Pulmonary:     Effort: Pulmonary effort is normal. No respiratory distress.     Breath sounds: Normal breath sounds. No stridor. No wheezing or rales.  Abdominal:     General: Bowel sounds are normal. There is no distension.     Palpations: Abdomen is soft.     Tenderness: There is no abdominal tenderness. There is no guarding or rebound.  Musculoskeletal:        General: No tenderness.     Cervical back: Neck supple.     Comments: Status post amputation left lower extremity, right lower extremity with mild amount of edema, superficial ulcerative wounds noted anterior lower leg, no surrounding erythema or purulence; AV fistula bilateral antecubital fossa's  Skin:    General: Skin is warm and dry.     Findings: No rash.  Neurological:     Mental Status: She is alert.     Cranial Nerves: No cranial nerve deficit (no facial droop, extraocular movements intact, no slurred speech).     Sensory: No sensory deficit.     Motor: No abnormal muscle tone or seizure activity.     Coordination: Coordination normal.     ED Results / Procedures / Treatments   Labs (all labs ordered are listed, but only abnormal results are displayed) Labs Reviewed  BASIC METABOLIC PANEL - Abnormal; Notable for the following components:      Result Value   CO2 21 (*)    Glucose, Bld 168 (*)    BUN 56 (*)    Creatinine, Ser 3.50 (*)    GFR calc non Af Amer 13 (*)    GFR calc Af Amer 15 (*)    All other components within normal limits  CBC - Abnormal; Notable for the following components:   RBC 3.03 (*)    Hemoglobin 9.6 (*)    HCT 32.3 (*)    MCV 106.6 (*)    MCHC 29.7 (*)    Platelets 136 (*)    All other components within normal limits  BRAIN NATRIURETIC PEPTIDE - Abnormal; Notable for the following components:   B Natriuretic Peptide 1,551.5 (*)    All other components within normal limits  CBG MONITORING, ED - Abnormal; Notable for the following components:   Glucose-Capillary 147 (*)    All  other components within normal limits    EKG EKG Interpretation  Date/Time:  Wednesday February 03 2020 16:50:32 EDT Ventricular Rate:  63 PR Interval:    QRS Duration: 86 QT Interval:  491 QTC Calculation: 503 R Axis:   -13 Text Interpretation: Sinus rhythm Probable left atrial enlargement Low voltage, precordial leads Borderline T abnormalities, inferior leads Prolonged QT interval No significant change since last tracing Confirmed by Dorie Rank [id number]) on 02/03/2020 5:00:51 PM   Radiology DG Chest 2 View  Result Date: 02/03/2020 CLINICAL DATA:  Chest pain short of breath EXAM: CHEST - 2 VIEW COMPARISON:  10/21/2019, 08/09/2019 FINDINGS: Cardiomegaly with vascular congestion and diffuse interstitial and hazy pulmonary opacities suspicious for edema. There may be small pleural effusions. Aortic atherosclerosis. No pneumothorax. IMPRESSION: Cardiomegaly with vascular congestion and diffuse interstitial and hazy lung opacity suspicious for pulmonary edema. Probable trace pleural effusions. Electronically Signed   By: Donavan Foil M.D.   On: 02/03/2020 17:32  Procedures Procedures (including critical care time)  Medications Ordered in ED Medications  sodium chloride flush (NS) 0.9 % injection 3 mL (has no administration in time range)  furosemide (LASIX) injection 80 mg (80 mg Intravenous Not Given 02/03/20 2149)  furosemide (LASIX) injection 80 mg (has no administration in time range)  bacitracin ointment 1 application (1 application Topical Given 02/03/20 2149)    ED Course  I have reviewed the triage vital signs and the nursing notes.  Pertinent labs & imaging results that were available during my care of the patient were reviewed by me and considered in my medical decision making (see chart for details).  Clinical Course as of Feb 02 2150  Wed Feb 03, 2020  1659 Labs reviewed.  BNP is elevated, increased from 5 years ago however patient does have chronic kidney disease now.    [JK]  1700 Creatinine is elevated compared to most recent although similar to values 5 months ago   [JK]  1736 [facility] hospital at home program is coming to assess patient.  IV lasix ordered   [JK]  4010 CXR suggestive of CHF   [JK]  2150 Notified that RN was unable to obtain IV.  I have changed the iv dose to IM.  Pt is anxious to go home.     [JK]    Clinical Course User Index [JK] Dorie Rank, MD   MDM Rules/Calculators/A&P                      Pt presents with CKD and concerns for CHF.  Pt does have a chronic leg wound but no signs of acute infection.  Pt not in any resp difficulty.  She was assessed by the heart failure at home team.  Pt is a good candidate and would like to go home.   Pt given 80 mg lasix IV.  Remained stable.  Final Clinical Impression(s) / ED Diagnoses Final diagnoses:  Acute on chronic congestive heart failure, unspecified heart failure type ([place])  Chronic kidney disease, unspecified CKD stage  Wound of left lower extremity, initial encounter    Rx / DC Orders ED Discharge Orders    None       Dorie Rank, MD 02/03/20 Raye Sorrow    Dorie Rank, MD 02/03/20 2151

## 2020-02-09 ENCOUNTER — Inpatient Hospital Stay (HOSPITAL_COMMUNITY)
Admission: EM | Admit: 2020-02-09 | Discharge: 2020-02-26 | DRG: 673 | Disposition: A | Discharge status: Hospice | Payer: Medicare Other | Attending: Family Medicine | Admitting: Family Medicine

## 2020-02-09 ENCOUNTER — Ambulatory Visit (HOSPITAL_BASED_OUTPATIENT_CLINIC_OR_DEPARTMENT_OTHER)
Admit: 2020-02-09 | Discharge: 2020-02-09 | Disposition: A | Discharge status: Home / Self Care | Payer: Medicare Other | Attending: Family Medicine | Admitting: Family Medicine

## 2020-02-09 ENCOUNTER — Emergency Department (HOSPITAL_COMMUNITY): Payer: Medicare Other

## 2020-02-09 ENCOUNTER — Encounter (HOSPITAL_COMMUNITY): Payer: Self-pay | Admitting: Family Medicine

## 2020-02-09 ENCOUNTER — Other Ambulatory Visit: Payer: Self-pay

## 2020-02-09 DIAGNOSIS — Z8249 Family history of ischemic heart disease and other diseases of the circulatory system: Secondary | ICD-10-CM

## 2020-02-09 DIAGNOSIS — I63032 Cerebral infarction due to thrombosis of left carotid artery: Secondary | ICD-10-CM

## 2020-02-09 DIAGNOSIS — D509 Iron deficiency anemia, unspecified: Secondary | ICD-10-CM | POA: Diagnosis present

## 2020-02-09 DIAGNOSIS — E1129 Type 2 diabetes mellitus with other diabetic kidney complication: Secondary | ICD-10-CM | POA: Diagnosis present

## 2020-02-09 DIAGNOSIS — L03115 Cellulitis of right lower limb: Secondary | ICD-10-CM

## 2020-02-09 DIAGNOSIS — I132 Hypertensive heart and chronic kidney disease with heart failure and with stage 5 chronic kidney disease, or end stage renal disease: Secondary | ICD-10-CM | POA: Diagnosis present

## 2020-02-09 DIAGNOSIS — Z6841 Body Mass Index (BMI) 40.0 and over, adult: Secondary | ICD-10-CM

## 2020-02-09 DIAGNOSIS — M7989 Other specified soft tissue disorders: Secondary | ICD-10-CM

## 2020-02-09 DIAGNOSIS — Z9861 Coronary angioplasty status: Secondary | ICD-10-CM

## 2020-02-09 DIAGNOSIS — Z7189 Other specified counseling: Secondary | ICD-10-CM

## 2020-02-09 DIAGNOSIS — K117 Disturbances of salivary secretion: Secondary | ICD-10-CM | POA: Diagnosis present

## 2020-02-09 DIAGNOSIS — Z955 Presence of coronary angioplasty implant and graft: Secondary | ICD-10-CM

## 2020-02-09 DIAGNOSIS — Z992 Dependence on renal dialysis: Secondary | ICD-10-CM

## 2020-02-09 DIAGNOSIS — N179 Acute kidney failure, unspecified: Secondary | ICD-10-CM | POA: Diagnosis not present

## 2020-02-09 DIAGNOSIS — Z7902 Long term (current) use of antithrombotics/antiplatelets: Secondary | ICD-10-CM

## 2020-02-09 DIAGNOSIS — R17 Unspecified jaundice: Secondary | ICD-10-CM | POA: Diagnosis present

## 2020-02-09 DIAGNOSIS — E440 Moderate protein-calorie malnutrition: Secondary | ICD-10-CM | POA: Diagnosis present

## 2020-02-09 DIAGNOSIS — N189 Chronic kidney disease, unspecified: Secondary | ICD-10-CM | POA: Diagnosis present

## 2020-02-09 DIAGNOSIS — N186 End stage renal disease: Secondary | ICD-10-CM

## 2020-02-09 DIAGNOSIS — Z9981 Dependence on supplemental oxygen: Secondary | ICD-10-CM

## 2020-02-09 DIAGNOSIS — E860 Dehydration: Secondary | ICD-10-CM | POA: Diagnosis present

## 2020-02-09 DIAGNOSIS — D696 Thrombocytopenia, unspecified: Secondary | ICD-10-CM | POA: Diagnosis present

## 2020-02-09 DIAGNOSIS — E1165 Type 2 diabetes mellitus with hyperglycemia: Secondary | ICD-10-CM | POA: Diagnosis present

## 2020-02-09 DIAGNOSIS — Z89512 Acquired absence of left leg below knee: Secondary | ICD-10-CM

## 2020-02-09 DIAGNOSIS — G9341 Metabolic encephalopathy: Secondary | ICD-10-CM | POA: Diagnosis not present

## 2020-02-09 DIAGNOSIS — I2510 Atherosclerotic heart disease of native coronary artery without angina pectoris: Secondary | ICD-10-CM | POA: Diagnosis not present

## 2020-02-09 DIAGNOSIS — I252 Old myocardial infarction: Secondary | ICD-10-CM

## 2020-02-09 DIAGNOSIS — E1151 Type 2 diabetes mellitus with diabetic peripheral angiopathy without gangrene: Secondary | ICD-10-CM | POA: Diagnosis present

## 2020-02-09 DIAGNOSIS — Z79899 Other long term (current) drug therapy: Secondary | ICD-10-CM

## 2020-02-09 DIAGNOSIS — I639 Cerebral infarction, unspecified: Secondary | ICD-10-CM | POA: Diagnosis present

## 2020-02-09 DIAGNOSIS — L853 Xerosis cutis: Secondary | ICD-10-CM

## 2020-02-09 DIAGNOSIS — Z833 Family history of diabetes mellitus: Secondary | ICD-10-CM

## 2020-02-09 DIAGNOSIS — R296 Repeated falls: Secondary | ICD-10-CM | POA: Diagnosis present

## 2020-02-09 DIAGNOSIS — I5033 Acute on chronic diastolic (congestive) heart failure: Secondary | ICD-10-CM

## 2020-02-09 DIAGNOSIS — Z20822 Contact with and (suspected) exposure to covid-19: Secondary | ICD-10-CM | POA: Diagnosis present

## 2020-02-09 DIAGNOSIS — R609 Edema, unspecified: Secondary | ICD-10-CM | POA: Diagnosis not present

## 2020-02-09 DIAGNOSIS — F329 Major depressive disorder, single episode, unspecified: Secondary | ICD-10-CM | POA: Diagnosis present

## 2020-02-09 DIAGNOSIS — E785 Hyperlipidemia, unspecified: Secondary | ICD-10-CM | POA: Diagnosis present

## 2020-02-09 DIAGNOSIS — Z09 Encounter for follow-up examination after completed treatment for conditions other than malignant neoplasm: Secondary | ICD-10-CM

## 2020-02-09 DIAGNOSIS — N184 Chronic kidney disease, stage 4 (severe): Secondary | ICD-10-CM | POA: Diagnosis not present

## 2020-02-09 DIAGNOSIS — N2581 Secondary hyperparathyroidism of renal origin: Secondary | ICD-10-CM | POA: Diagnosis present

## 2020-02-09 DIAGNOSIS — Z66 Do not resuscitate: Secondary | ICD-10-CM | POA: Diagnosis not present

## 2020-02-09 DIAGNOSIS — W19XXXA Unspecified fall, initial encounter: Secondary | ICD-10-CM

## 2020-02-09 DIAGNOSIS — E538 Deficiency of other specified B group vitamins: Secondary | ICD-10-CM | POA: Diagnosis present

## 2020-02-09 DIAGNOSIS — L8990 Pressure ulcer of unspecified site, unspecified stage: Secondary | ICD-10-CM | POA: Insufficient documentation

## 2020-02-09 DIAGNOSIS — N185 Chronic kidney disease, stage 5: Secondary | ICD-10-CM | POA: Diagnosis present

## 2020-02-09 DIAGNOSIS — M109 Gout, unspecified: Secondary | ICD-10-CM | POA: Diagnosis present

## 2020-02-09 DIAGNOSIS — Z87891 Personal history of nicotine dependence: Secondary | ICD-10-CM

## 2020-02-09 DIAGNOSIS — I69331 Monoplegia of upper limb following cerebral infarction affecting right dominant side: Secondary | ICD-10-CM

## 2020-02-09 DIAGNOSIS — Z9889 Other specified postprocedural states: Secondary | ICD-10-CM

## 2020-02-09 DIAGNOSIS — R5381 Other malaise: Secondary | ICD-10-CM | POA: Diagnosis present

## 2020-02-09 DIAGNOSIS — E861 Hypovolemia: Secondary | ICD-10-CM | POA: Diagnosis present

## 2020-02-09 DIAGNOSIS — Z515 Encounter for palliative care: Secondary | ICD-10-CM | POA: Diagnosis not present

## 2020-02-09 DIAGNOSIS — Z789 Other specified health status: Secondary | ICD-10-CM

## 2020-02-09 DIAGNOSIS — Z794 Long term (current) use of insulin: Secondary | ICD-10-CM

## 2020-02-09 DIAGNOSIS — E1122 Type 2 diabetes mellitus with diabetic chronic kidney disease: Secondary | ICD-10-CM | POA: Diagnosis present

## 2020-02-09 DIAGNOSIS — D631 Anemia in chronic kidney disease: Secondary | ICD-10-CM

## 2020-02-09 LAB — GLUCOSE, CAPILLARY
Glucose-Capillary: 132 mg/dL — ABNORMAL HIGH (ref 70–99)
Glucose-Capillary: 145 mg/dL — ABNORMAL HIGH (ref 70–99)

## 2020-02-09 LAB — CBC WITH DIFFERENTIAL/PLATELET
Abs Immature Granulocytes: 0.02 10*3/uL (ref 0.00–0.07)
Basophils Absolute: 0 10*3/uL (ref 0.0–0.1)
Basophils Relative: 0 %
Eosinophils Absolute: 0 10*3/uL (ref 0.0–0.5)
Eosinophils Relative: 0 %
HCT: 31.1 % — ABNORMAL LOW (ref 36.0–46.0)
Hemoglobin: 9.5 g/dL — ABNORMAL LOW (ref 12.0–15.0)
Immature Granulocytes: 0 %
Lymphocytes Relative: 7 %
Lymphs Abs: 0.4 10*3/uL — ABNORMAL LOW (ref 0.7–4.0)
MCH: 31.4 pg (ref 26.0–34.0)
MCHC: 30.5 g/dL (ref 30.0–36.0)
MCV: 102.6 fL — ABNORMAL HIGH (ref 80.0–100.0)
Monocytes Absolute: 0.8 10*3/uL (ref 0.1–1.0)
Monocytes Relative: 14 %
Neutro Abs: 4.4 10*3/uL (ref 1.7–7.7)
Neutrophils Relative %: 79 %
Platelets: 89 10*3/uL — ABNORMAL LOW (ref 150–400)
RBC: 3.03 MIL/uL — ABNORMAL LOW (ref 3.87–5.11)
RDW: 15.6 % — ABNORMAL HIGH (ref 11.5–15.5)
WBC: 5.6 10*3/uL (ref 4.0–10.5)
nRBC: 0 % (ref 0.0–0.2)

## 2020-02-09 LAB — CBG MONITORING, ED
Glucose-Capillary: 147 mg/dL — ABNORMAL HIGH (ref 70–99)
Glucose-Capillary: 145 mg/dL — ABNORMAL HIGH (ref 70–99)
Glucose-Capillary: 138 mg/dL — ABNORMAL HIGH (ref 70–99)

## 2020-02-09 LAB — COMPREHENSIVE METABOLIC PANEL
ALT: 14 U/L (ref 0–44)
AST: 13 U/L — ABNORMAL LOW (ref 15–41)
Albumin: 2.9 g/dL — ABNORMAL LOW (ref 3.5–5.0)
Alkaline Phosphatase: 98 U/L (ref 38–126)
Anion gap: 15 (ref 5–15)
BUN: 68 mg/dL — ABNORMAL HIGH (ref 8–23)
CO2: 21 mmol/L — ABNORMAL LOW (ref 22–32)
Calcium: 9.1 mg/dL (ref 8.9–10.3)
Chloride: 105 mmol/L (ref 98–111)
Creatinine, Ser: 3.98 mg/dL — ABNORMAL HIGH (ref 0.44–1.00)
GFR calc Af Amer: 13 mL/min — ABNORMAL LOW (ref >60)
GFR calc non Af Amer: 11 mL/min — ABNORMAL LOW (ref >60)
Glucose, Bld: 159 mg/dL — ABNORMAL HIGH (ref 70–99)
Potassium: 4.1 mmol/L (ref 3.5–5.1)
Sodium: 141 mmol/L (ref 135–145)
Total Bilirubin: 2.2 mg/dL — ABNORMAL HIGH (ref 0.3–1.2)
Total Protein: 6.1 g/dL — ABNORMAL LOW (ref 6.5–8.1)

## 2020-02-09 LAB — HEMOGLOBIN A1C
Hgb A1c MFr Bld: 6 % — ABNORMAL HIGH (ref 4.8–5.6)
Mean Plasma Glucose: 125.5 mg/dL

## 2020-02-09 LAB — TROPONIN I (HIGH SENSITIVITY)
Troponin I (High Sensitivity): 23 ng/L — ABNORMAL HIGH (ref <18)
Troponin I (High Sensitivity): 21 ng/L — ABNORMAL HIGH (ref <18)

## 2020-02-09 LAB — RESPIRATORY PANEL BY RT PCR (FLU A&B, COVID)
Influenza A by PCR: NEGATIVE
Influenza B by PCR: NEGATIVE
SARS Coronavirus 2 by RT PCR: NEGATIVE

## 2020-02-09 LAB — BILIRUBIN, FRACTIONATED(TOT/DIR/INDIR)
Bilirubin, Direct: 1 mg/dL — ABNORMAL HIGH (ref 0.0–0.2)
Indirect Bilirubin: 1.2 mg/dL — ABNORMAL HIGH (ref 0.3–0.9)
Total Bilirubin: 2.2 mg/dL — ABNORMAL HIGH (ref 0.3–1.2)

## 2020-02-09 LAB — LACTIC ACID, PLASMA: Lactic Acid, Venous: 2.7 mmol/L (ref 0.5–1.9)

## 2020-02-09 LAB — BRAIN NATRIURETIC PEPTIDE: B Natriuretic Peptide: 1476.5 pg/mL — ABNORMAL HIGH (ref 0.0–100.0)

## 2020-02-09 MED ORDER — ACETAMINOPHEN 325 MG PO TABS
650.0000 mg | ORAL_TABLET | Freq: Four times a day (QID) | ORAL | Status: DC | PRN
  Administered 2020-02-10 – 2020-02-21 (×3): 650 mg via ORAL
  Filled 2020-02-09 (×3): qty 2

## 2020-02-09 MED ORDER — VANCOMYCIN HCL IN DEXTROSE 1-5 GM/200ML-% IV SOLN
1000.0000 mg | Freq: Once | INTRAVENOUS | Status: DC
  Filled 2020-02-09: qty 200

## 2020-02-09 MED ORDER — SODIUM CHLORIDE 0.9% FLUSH
3.0000 mL | Freq: Two times a day (BID) | INTRAVENOUS | Status: DC
  Administered 2020-02-10 – 2020-02-26 (×13): 3 mL via INTRAVENOUS

## 2020-02-09 MED ORDER — VANCOMYCIN VARIABLE DOSE PER UNSTABLE RENAL FUNCTION (PHARMACIST DOSING): Status: DC

## 2020-02-09 MED ORDER — SODIUM CHLORIDE 0.9% FLUSH: 3.0000 mL | INTRAVENOUS | Status: DC | PRN

## 2020-02-09 MED ORDER — VANCOMYCIN HCL 2000 MG/400ML IV SOLN
2000.0000 mg | Freq: Once | INTRAVENOUS | Status: AC
  Administered 2020-02-09: 2000 mg via INTRAVENOUS
  Filled 2020-02-09: qty 400

## 2020-02-09 MED ORDER — ATORVASTATIN CALCIUM 80 MG PO TABS
80.0000 mg | ORAL_TABLET | Freq: Every day | ORAL | Status: DC
  Administered 2020-02-09 – 2020-02-25 (×14): 80 mg via ORAL
  Filled 2020-02-09 (×7): qty 1
  Filled 2020-02-09: qty 2
  Filled 2020-02-09 (×9): qty 1

## 2020-02-09 MED ORDER — ONDANSETRON HCL 4 MG PO TABS: 4.0000 mg | ORAL_TABLET | Freq: Four times a day (QID) | ORAL | Status: DC | PRN

## 2020-02-09 MED ORDER — CHLORHEXIDINE GLUCONATE CLOTH 2 % EX PADS: 6.0000 | MEDICATED_PAD | Freq: Every day | CUTANEOUS | Status: DC

## 2020-02-09 MED ORDER — SODIUM CHLORIDE 0.9 % IV SOLN
250.0000 mL | INTRAVENOUS | Status: DC | PRN
  Administered 2020-02-10: 250 mL via INTRAVENOUS

## 2020-02-09 MED ORDER — FEBUXOSTAT 40 MG PO TABS
40.0000 mg | ORAL_TABLET | Freq: Every day | ORAL | Status: DC
  Administered 2020-02-09 – 2020-02-11 (×3): 40 mg via ORAL
  Filled 2020-02-09 (×3): qty 1

## 2020-02-09 MED ORDER — FUROSEMIDE 80 MG PO TABS
80.0000 mg | ORAL_TABLET | Freq: Two times a day (BID) | ORAL | Status: DC
  Administered 2020-02-09 – 2020-02-11 (×5): 80 mg via ORAL
  Filled 2020-02-09 (×4): qty 1
  Filled 2020-02-09: qty 4

## 2020-02-09 MED ORDER — ONDANSETRON HCL 4 MG/2ML IJ SOLN
4.0000 mg | Freq: Four times a day (QID) | INTRAMUSCULAR | Status: DC | PRN
  Administered 2020-02-22: 4 mg via INTRAVENOUS
  Filled 2020-02-09: qty 2

## 2020-02-09 MED ORDER — INSULIN ASPART 100 UNIT/ML ~~LOC~~ SOLN
0.0000 [IU] | Freq: Three times a day (TID) | SUBCUTANEOUS | Status: DC
  Administered 2020-02-10 – 2020-02-13 (×4): 1 [IU] via SUBCUTANEOUS

## 2020-02-09 MED ORDER — ACETAMINOPHEN 650 MG RE SUPP: 650.0000 mg | Freq: Four times a day (QID) | RECTAL | Status: DC | PRN

## 2020-02-09 MED ORDER — CLOPIDOGREL BISULFATE 75 MG PO TABS
75.0000 mg | ORAL_TABLET | Freq: Every day | ORAL | Status: DC
  Administered 2020-02-09 – 2020-02-26 (×17): 75 mg via ORAL
  Filled 2020-02-09 (×17): qty 1

## 2020-02-09 MED ORDER — OMEGA-3-ACID ETHYL ESTERS 1 G PO CAPS
1.0000 g | ORAL_CAPSULE | Freq: Two times a day (BID) | ORAL | Status: DC
  Administered 2020-02-09 – 2020-02-25 (×16): 1 g via ORAL
  Filled 2020-02-09 (×30): qty 1

## 2020-02-09 MED ORDER — CARVEDILOL 25 MG PO TABS
25.0000 mg | ORAL_TABLET | Freq: Two times a day (BID) | ORAL | Status: DC
  Administered 2020-02-09 – 2020-02-26 (×30): 25 mg via ORAL
  Filled 2020-02-09: qty 2
  Filled 2020-02-09 (×31): qty 1

## 2020-02-09 MED ORDER — FUROSEMIDE 10 MG/ML IJ SOLN
40.0000 mg | Freq: Once | INTRAMUSCULAR | Status: AC
  Administered 2020-02-09: 40 mg via INTRAVENOUS
  Filled 2020-02-09: qty 4

## 2020-02-09 MED ORDER — BUPROPION HCL ER (XL) 150 MG PO TB24
300.0000 mg | ORAL_TABLET | Freq: Every day | ORAL | Status: DC
  Administered 2020-02-09 – 2020-02-12 (×4): 300 mg via ORAL
  Filled 2020-02-09 (×4): qty 2

## 2020-02-09 MED ORDER — SODIUM CHLORIDE 0.9% FLUSH
3.0000 mL | Freq: Two times a day (BID) | INTRAVENOUS | Status: DC
  Administered 2020-02-09 – 2020-02-17 (×14): 3 mL via INTRAVENOUS

## 2020-02-09 MED ORDER — COLCHICINE 0.6 MG PO TABS
0.6000 mg | ORAL_TABLET | Freq: Every day | ORAL | Status: DC
  Administered 2020-02-09 – 2020-02-10 (×2): 0.6 mg via ORAL
  Filled 2020-02-09 (×3): qty 1

## 2020-02-09 NOTE — ED Notes (Signed)
Patient placed on pure wick °

## 2020-02-09 NOTE — H&P (Signed)
History and Physical    [name] IWL:[id number] DOB: 1951-09-10 DOA: 02/09/2020  PCP: Katherina Mires, MD   Patient coming from: Home   Chief Complaint: Right leg swelling, pain, drainage   HPI: [name] is a 69 y.o. female with medical history significant for hypertension, diabetes, coronary artery disease, history of CVA, chronic diastolic CHF, and CKD 4, now presenting to the emergency department with progressive redness, swelling, and pain involving the right lower extremity.  Patient was recently seen in the ED with shortness of breath, was given a dose of IM Lasix, and has had improvement in her breathing since then.  Her daughter also notes that her home diuretics were recently changed and seems to be helping.  But unfortunately, over the past few days, she has had progressive redness, swelling, and pain involving the lower right leg and the patient also reports some purulent drainage from the anterior aspect.  She has not noted any fevers or chills.  She denies chest pain.  She denies any history of DVT or PE.  Patient has reportedly fallen a couple times recently at home but with no significant injury per the patient and her daughter.  ED Course: Upon arrival to the ED, patient is found to be afebrile, saturating mid 90s on room air, and with stable blood pressure.  EKG features sinus rhythm with PAC.  Chest x-ray with cardiomegaly and interstitial edema.  Radiographs of the right tib/fib, foot, femur, as well as hips and pelvis are all negative for acute osseous abnormality.  Noncontrast head CT negative for acute intracranial abnormality.  Chemistry panel with creatinine of 3.98, up from 3.50 several days earlier.  Total bilirubin is 2.2.  CBC features a stable microcytic anemia and a worsened thrombocytopenia with platelets now 89,000.  Lactic acid is elevated to 3.1, then down to 2.7.  High-sensitivity troponin is slightly elevated and stable when repeated.  BNP is elevated 1477.   Patient was treated with IV Lasix in the ED, blood cultures were collected, and vancomycin was started.  Review of Systems:  All other systems reviewed and apart from HPI, are negative.  Past Medical History:  Diagnosis Date  . Anemia   . Arthritis    "right shoulder" (12/19/2016)  . CAD S/P percutaneous coronary angioplasty 5/110    status post PCI to the RCA for inferior STEMI  . Cervical cancer ([place])   . Chronic diastolic heart failure, NYHA class 2 ([place])    Echocardiogram 06/2014:  Technically difficult study. Normal LV size with mild LVH. EF 55-60%. Moderate diastolic dysfunction, normal RV size and function. Likely aortic sclerosis without stenosis  . Chronic lower back pain   . CKD (chronic kidney disease), stage IV ([place])    Recent acute on chronic exacerbation in July 2014  . Depression   . Diabetes mellitus, type II, insulin dependent ([place])    With complications - CAD, CVA, peripheral ulcer ,  . Diabetic foot ulcer ([place])   . Diabetic peripheral neuropathy associated with type 2 diabetes mellitus ([place])   . 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 History of blood transfusion    "w/hysterectomy"   . Hyperlipidemia LDL goal <70   . Hypertension associated with diabetes ([place])   . Obesity, Class III, BMI 40-49.9 (morbid obesity) ([facility])    BMI 43; 5' 2',  235 pounds 6.4 ounces  . On home oxygen therapy    "  2.5L prn; sometimes as little as once/month" (12/19/2016)  . PAD (peripheral artery disease) ([place])     -- Most recent Dopplers April 2016 Status post left BKA. Known right PTA occlusion  . Pneumonia    "childhood"  . ST elevation myocardial infarction (STEMI) of inferior wall ([place])  03/2009   Ostial/proximal RCA occlusion treated with BMS stent  . Stroke/cerebrovascular accident ([place]) 03/18/2013; 12/19/2016   a. Right-sided weakness, mostly with balance issues and only mild weakness;; b. Slurring speach -- L Posterior Putamen CVA    Past  Surgical History:  Procedure Laterality Date  . ABDOMINAL HYSTERECTOMY    . AMPUTATION Left 08/07/2013   Procedure: left midfoot amputation;  Surgeon: Marianna Payment, MD;  Location: [facility];  Service: Orthopedics;  Laterality: Left;  . AMPUTATION Left 08/09/2013   Procedure: AMPUTATION BELOW KNEE;  Surgeon: Marianna Payment, MD;  Location: [facility];  Service: Orthopedics;  Laterality: Left;  . AV FISTULA PLACEMENT Left 12/03/2014   Procedure: ARTERIOVENOUS (AV) FISTULA CREATION;  Surgeon: Angelia Mould, MD;  Location: [facility];  Service: Vascular;  Laterality: Left;  . AV FISTULA PLACEMENT Right 08/12/2019   Procedure: ARTERIOVENOUS (AV) FISTULA CREATION;  Surgeon: Marty Heck, MD;  Location: [facility];  Service: Vascular;  Laterality: Right;  . CARDIAC CATHETERIZATION  03/31/2009   Proximal RCA thrombotic occlusion (inferior STEMI) other coronaries but in a codominant system  . EYE SURGERY     "bleeding; vessels leaking"  . [address]   "broke her leg in 2 places when she was young"  . LEG SURGERY     hole in bone( during Childhood)  . LEG TENDON SURGERY Right 2000s   patient fell @ [place]  . PERCUTANEOUS CORONARY STENT INTERVENTION (PCI-S)  03/31/2009   PTCA , bare-metal stent 3.5 mm x 24 mm ostium of RCA ,EF 55%  . TRANSTHORACIC ECHOCARDIOGRAM  06/2014; 12/2016   a. Technically difficult study. Normal LV size with mild LVH. EF 55-60%. Mod - GR 2 DD. Nl RV size & fxn. ~ Aortic Sclerosis w/ stenosis;; b. LVEF 65-70%, GR 1-2 DD. No RWMA, Mod LA Dilation     reports that she quit smoking about 24 years ago. She has never used smokeless tobacco. She reports that she does not drink alcohol or use drugs.  No Known Allergies  Family History  Problem Relation Age of Onset  . Alcoholism Mother        Died from complications  . Alcoholism Father        Died from complications  . Cancer Father        Throat  . Pneumonia Father   . Diabetes Father   . Hypertension  Father   . Heart disease Other        Unknown, unclear. Patient is not a good historian.  Essentially no pertinent history known     Prior to Admission medications   Medication Sig Start Date End Date Taking? Authorizing Provider  atorvastatin (LIPITOR) 80 MG tablet Take 1 tablet (80 mg total) by mouth daily at 6 PM. 12/20/16  Yes Svalina, Estill Dooms, MD  buPROPion (WELLBUTRIN XL) 300 MG 24 hr tablet Take 300 mg by mouth daily.   Yes [provider]  carvedilol (COREG) 25 MG tablet Take 25 mg by mouth every 12 (twelve) hours.  11/23/14  Yes [provider]  clopidogrel (PLAVIX) 75 MG tablet Take 1 tablet (75 mg total) by mouth daily. 12/20/16  Yes Alphonzo Grieve,  MD  colchicine 0.6 MG tablet Take 0.6 mg by mouth daily.  03/19/18  Yes [provider]  febuxostat (ULORIC) 40 MG tablet Take 40 mg by mouth daily. 01/06/20  Yes [provider]  furosemide (LASIX) 40 MG tablet Take 80 mg by mouth 2 (two) times daily.  08/10/14  Yes Verlee Monte, MD  insulin lispro (HUMALOG) 100 UNIT/ML KwikPen Inject 0-9 Units into the skin 3 (three) times daily.  08/14/19  Yes [provider]  Omega-3 Fatty Acids (FISH OIL) 1000 MG CAPS Take 1,000 mg by mouth 2 (two) times daily.   Yes [provider]  ezetimibe (ZETIA) 10 MG tablet Take 1 tablet (10 mg total) by mouth daily. Patient not taking: Reported on 02/09/2020 02/21/17 02/08/29  Leonie Man, MD  glucose blood (ACCU-CHEK AVIVA PLUS) test strip 1 each by Other route See admin instructions. Use one strip to check glucose before meals and at bedtime. 06/18/14   [provider]  insulin aspart (NOVOLOG) 100 UNIT/ML injection Inject 0-9 Units into the skin 3 (three) times daily before meals. 0-9 Units, Subcutaneous, 3 times daily with meals CBG < 70: Implement Hypoglycemia measures CBG 70 - 120: 0 units CBG 121 - 150: 1 unit CBG 151 - 200: 2 units CBG 201 - 250: 3 units CBG 251 - 300: 5 units CBG 301 - 350: 7 units  CBG 351 - 400: 9 units CBG > 400: call MD Patient not taking: Reported on 02/03/2020 08/16/19   Lavina Hamman, MD  Insulin Syringe-Needle U-100 (INSULIN SYRINGE .3CC/31GX5/16") 31G X 5/16" 0.3 ML MISC 1 Act by Does not apply route 4 (four) times daily -  before meals and at bedtime. Patient taking differently: 1 each by Other route 4 (four) times daily -  before meals and at bedtime.  08/16/19   Lavina Hamman, MD  metaxalone (SKELAXIN) 800 MG tablet Take 0.5-1 tablets (400-800 mg total) by mouth 3 (three) times daily. Patient not taking: Reported on 02/03/2020 07/02/17   Lysbeth Penner, FNP  OXYGEN Inhale 2.5 L into the lungs as needed (Breathing.).     [provider]    Physical Exam: Vitals:   02/09/20 0130 02/09/20 0400 02/09/20 0438 02/09/20 0525  BP: (!) 141/65 97/75 (!) 146/60   Pulse: 65 64 62   Resp: 20 18 15    Temp:    98.6 F (37 C)  TempSrc:    Oral  SpO2: 92% 97% 94%   Weight:      Height:         Constitutional: NAD, calm  Eyes: PERTLA, lids and conjunctivae normal ENMT: Mucous membranes are moist. Posterior pharynx clear of any exudate or lesions.   Neck: normal, supple, no masses, no thyromegaly Respiratory: clear to auscultation bilaterally, no wheezing, no crackles. No accessory muscle use.  Cardiovascular: S1 & S2 heard, regular rate and rhythm. No extremity edema. No significant JVD. Abdomen: No distension, no tenderness, soft. Bowel sounds active.  Musculoskeletal: no clubbing / cyanosis. Status-post left BKA.   Skin: Lower right leg erythematous, warm, edematous, and tender. Warm, dry, well-perfused. Neurologic: Mild dysarthria, no aphasia. Sensation intact. Moving all extremities.  Psychiatric: Sleeping, wakes to voice, oriented to person and place. Calm and cooperative.    Labs and Imaging on Admission: I have personally reviewed following labs and imaging studies  CBC: Recent Labs  Lab 02/03/20 1537 02/09/20 0122  WBC 4.8 5.6  NEUTROABS   --  4.4  HGB 9.6*  9.5*  HCT 32.3* 31.1*  MCV 106.6* 102.6*  PLT 136* 89*   Basic Metabolic Panel: Recent Labs  Lab 02/03/20 1537 02/09/20 0122  NA 143 141  K 4.2 4.1  CL 110 105  CO2 21* 21*  GLUCOSE 168* 159*  BUN 56* 68*  CREATININE 3.50* 3.98*  CALCIUM 9.1 9.1   GFR: Estimated Creatinine Clearance: 14.5 mL/min (A) (by C-G formula based on SCr of 3.98 mg/dL (H)). Liver Function Tests: Recent Labs  Lab 02/09/20 0122  AST 13*  ALT 14  ALKPHOS 98  BILITOT 2.2*  PROT 6.1*  ALBUMIN 2.9*   No results for input(s): LIPASE, AMYLASE in the last 168 hours. No results for input(s): AMMONIA in the last 168 hours. Coagulation Profile: No results for input(s): INR, PROTIME in the last 168 hours. Cardiac Enzymes: No results for input(s): CKTOTAL, CKMB, CKMBINDEX, TROPONINI in the last 168 hours. BNP (last 3 results) No results for input(s): PROBNP in the last 8760 hours. HbA1C: No results for input(s): HGBA1C in the last 72 hours. CBG: Recent Labs  Lab 02/03/20 1531  GLUCAP 147*   Lipid Profile: No results for input(s): CHOL, HDL, LDLCALC, TRIG, CHOLHDL, LDLDIRECT in the last 72 hours. Thyroid Function Tests: No results for input(s): TSH, T4TOTAL, FREET4, T3FREE, THYROIDAB in the last 72 hours. Anemia Panel: No results for input(s): VITAMINB12, FOLATE, FERRITIN, TIBC, IRON, RETICCTPCT in the last 72 hours. Urine analysis:    Component Value Date/Time   COLORURINE YELLOW 08/09/2019 1518   APPEARANCEUR HAZY (A) 08/09/2019 1518   LABSPEC 1.011 08/09/2019 1518   PHURINE 8.0 08/09/2019 1518   GLUCOSEU NEGATIVE 08/09/2019 1518   HGBUR NEGATIVE 08/09/2019 1518   BILIRUBINUR NEGATIVE 08/09/2019 1518   KETONESUR NEGATIVE 08/09/2019 1518   PROTEINUR 30 (A) 08/09/2019 1518   UROBILINOGEN 0.2 08/24/2015 1002   NITRITE NEGATIVE 08/09/2019 1518   LEUKOCYTESUR LARGE (A) 08/09/2019 1518   Sepsis Labs: @LABRCNTIP (procalcitonin:4,lacticidven:4) ) Recent Results (from the  past 240 hour(s))  Blood culture (routine x 2)     Status: None (Preliminary result)   Collection Time: 02/09/20  1:25 AM   Specimen: BLOOD LEFT ARM  Result Value Ref Range Status   Specimen Description BLOOD LEFT ARM  Final   Special Requests   Final    BOTTLES DRAWN AEROBIC AND ANAEROBIC Blood Culture adequate volume Performed at [facility], [place] [address] [place], [place] [address]    Culture NO GROWTH < 12 HOURS  Final   Report Status PENDING  Incomplete  Respiratory Panel by RT PCR (Flu A&B, Covid) - Nasopharyngeal Swab     Status: None   Collection Time: 02/09/20  3:08 AM   Specimen: Nasopharyngeal Swab  Result Value Ref Range Status   SARS Coronavirus 2 by RT PCR NEGATIVE NEGATIVE Final    Comment: (NOTE) SARS-CoV-2 target nucleic acids are NOT DETECTED. The SARS-CoV-2 RNA is generally detectable in upper respiratoy specimens during the acute phase of infection. The lowest concentration of SARS-CoV-2 viral copies this assay can detect is 131 copies/mL. A negative result does not preclude SARS-Cov-2 infection and should not be used as the sole basis for treatment or other patient management decisions. A negative result may occur with  improper specimen collection/handling, submission of specimen other than nasopharyngeal swab, presence of viral mutation(s) within the areas targeted by this assay, and inadequate number of viral copies (<131 copies/mL). A negative result must be combined with clinical observations, patient history, and epidemiological information. The expected result  is Negative. Fact Sheet for Patients:  PinkCheek.be Fact Sheet for Healthcare Providers:  GravelBags.it This test is not yet ap proved or cleared by the [place] FDA and  has been authorized for detection and/or diagnosis of SARS-CoV-2 by FDA under an Emergency Use Authorization (EUA). This EUA will remain  in effect (meaning  this test can be used) for the duration of the COVID-19 declaration under Section 564(b)(1) of the Act, 21 U.S.C. section 360bbb-3(b)(1), unless the authorization is terminated or revoked sooner.    Influenza A by PCR NEGATIVE NEGATIVE Final   Influenza B by PCR NEGATIVE NEGATIVE Final    Comment: (NOTE) The Xpert Xpress SARS-CoV-2/FLU/RSV assay is intended as an aid in  the diagnosis of influenza from Nasopharyngeal swab specimens and  should not be used as a sole basis for treatment. Nasal washings and  aspirates are unacceptable for Xpert Xpress SARS-CoV-2/FLU/RSV  testing. Fact Sheet for Patients: PinkCheek.be Fact Sheet for Healthcare Providers: GravelBags.it This test is not yet approved or cleared by the [place] FDA and  has been authorized for detection and/or diagnosis of SARS-CoV-2 by  FDA under an Emergency Use Authorization (EUA). This EUA will remain  in effect (meaning this test can be used) for the duration of the  Covid-19 declaration under Section 564(b)(1) of the Act, 21  U.S.C. section 360bbb-3(b)(1), unless the authorization is  terminated or revoked. Performed at [facility], [place] [address] [place], [place] [address]   Blood culture (routine x 2)     Status: None (Preliminary result)   Collection Time: 02/09/20  3:15 AM   Specimen: BLOOD LEFT HAND  Result Value Ref Range Status   Specimen Description BLOOD LEFT HAND  Final   Special Requests   Final    BOTTLES DRAWN AEROBIC ONLY Blood Culture results may not be optimal due to an inadequate volume of blood received in culture bottles   Culture NO GROWTH <12 HOURS  Final   Report Status PENDING  Incomplete     Radiological Exams on Admission: DG Pelvis 1-2 Views  Result Date: 02/09/2020 CLINICAL DATA:  Cellulitis of right lower extremity EXAM: PELVIS - 1-2 VIEW COMPARISON:  None. FINDINGS: There is diffuse osteopenia. Bilateral hip  osteoarthritis is seen with superior joint space loss. No definite fracture seen. There is dense vascular calcifications. IMPRESSION: Diffuse osteopenia, however no definite acute osseous abnormality. Electronically Signed   By: Prudencio Pair M.D.   On: 02/09/2020 02:15   DG Tibia/Fibula Right  Result Date: 02/09/2020 CLINICAL DATA:  Right lower extremity drainage and tenderness EXAM: RIGHT TIBIA AND FIBULA - 2 VIEW COMPARISON:  None. FINDINGS: There is no evidence of fracture or other focal bone lesions. Pretibial soft tissue swelling seen. Dense vascular calcifications are noted. There is diffuse osteopenia. IMPRESSION: No acute osseous abnormality.  Pretibial soft tissue swelling. Electronically Signed   By: Prudencio Pair M.D.   On: 02/09/2020 02:15   DG Ankle Complete Right  Result Date: 02/09/2020 CLINICAL DATA:  Fall you will itis EXAM: RIGHT ANKLE - COMPLETE 3+ VIEW COMPARISON:  November 20, 2013 FINDINGS: No fracture or dislocation. Cystic erosive type changes seen within the midfoot and partially visualized MTP joint. Hindfoot osteoarthritis is noted at the tibiotalar joint. Calcaneal enthesophytes. Dense vascular calcifications. There is diffuse soft tissue swelling seen in the pretibial region. IMPRESSION: No acute osseous abnormality pretibial soft tissue swelling. Since 2015 erosive type change within the midfoot which could be due to inflammatory/Charcot arthropathy. Electronically  Signed   By: Prudencio Pair M.D.   On: 02/09/2020 02:13   CT Head Wo Contrast  Result Date: 02/09/2020 CLINICAL DATA:  Leg cellulitis ,cephalopathy EXAM: CT HEAD WITHOUT CONTRAST TECHNIQUE: Contiguous axial images were obtained from the base of the skull through the vertex without intravenous contrast. COMPARISON:  December 19, 2016 FINDINGS: Brain: No evidence of acute territorial infarction, hemorrhage, hydrocephalus,extra-axial collection or mass lesion/mass effect. There is dilatation the ventricles and sulci  consistent with age-related atrophy. Low-attenuation changes in the deep white matter consistent with small vessel ischemia. Prior left-sided lacunar infarct involving the corona radiata Vascular: No hyperdense vessel or unexpected calcification. Skull: The skull is intact. No fracture or focal lesion identified. Sinuses/Orbits: The visualized paranasal sinuses and mastoid air cells are clear. The orbits and globes intact. Other: None IMPRESSION: No acute intracranial abnormality. Findings consistent with age related atrophy and chronic small vessel ischemia Electronically Signed   By: Prudencio Pair M.D.   On: 02/09/2020 04:59   DG Chest Portable 1 View  Result Date: 02/09/2020 CLINICAL DATA:  Shortness of breath EXAM: PORTABLE CHEST 1 VIEW COMPARISON:  February 03, 2020 FINDINGS: There is cardiomegaly. Diffusely increased interstitial markings are seen throughout both lungs. No acute osseous abnormality. IMPRESSION: Cardiomegaly and interstitial edema. Electronically Signed   By: Prudencio Pair M.D.   On: 02/09/2020 02:11   DG FEMUR, MIN 2 VIEWS RIGHT  Result Date: 02/09/2020 CLINICAL DATA:  Drainage and tenderness EXAM: RIGHT FEMUR 2 VIEWS COMPARISON:  None. FINDINGS: There is no evidence of fracture or other focal bone lesions. There is diffuse osteopenia. Dense vascular calcifications are noted. Fragmented ossicle seen adjacent to the inferior patellar pole. IMPRESSION: No acute osseous abnormality. Electronically Signed   By: Prudencio Pair M.D.   On: 02/09/2020 02:14    EKG: Independently reviewed. Sinus rhythm, PAC.   Assessment/Plan   1. Right leg cellulitis  - Presents with several days of worsening redness, swelling, and pain involving the lower right leg and reports there was some purulent drainage from the anterior aspect   - Blood cultures collected in ED and she was started on vancomycin in ED  - Check RLE venous doppler to exclude DVT, continue vancomycin for now, follow cultures and  clinical response to treatment    2. Acute on chronic diastolic CHF  - Per patient and her daughter, SOB has been improving with recent change in her diuretics  - She was given a dose of IV Lasix in ED   - Continue Lasix 80 mg PO BID, fluid-restrict diet, continue Coreg, follow daily wt and I/Os    3. Anemia; thrombocytopenia - Hgb appears stable at 9.5 and thrombocytopenia worse with platelets now 89,000  - No bleeding reported, check anemia panel, monitor    4. Hyperbilirubinemia  - Total bilirubin is 2.2 without abdominal pain, was previously normal  - Fractionate bilirubin, repeat CMP in am    5. Insulin-dependent DM  - Continue CBG checks and insulin, update A1c   6. History of CVA  - Continue Plavix and statin   7. CKD IV  - SCr is 3.98 in ED, similar to priors   - Renally-dose medications, monitor    8. CAD   - No chest pain, continue ASA and Plavix    DVT prophylaxis: SCD Code Status: Full  Family Communication: Daughter updated at bedside Disposition Plan: Likely back home with daughter in 1-2 days pending RLE venous doppler and, if not DVT, improvement in the  suspected infectious etiology  Consults called: None  Admission status: Observation    Vianne Bulls, MD [facility] Pager: See [email or web address]  If 7AM-7PM, please contact the daytime attending [email or web address]  02/09/2020, 6:00 AM

## 2020-02-09 NOTE — ED Notes (Signed)
Pt bladder scanned, after pt used bedpan. Highest amount scanned was 681 mL.

## 2020-02-09 NOTE — ED Notes (Signed)
Lunch Tray Ordered @ 1028. 

## 2020-02-09 NOTE — Progress Notes (Signed)
Lower extremity venous has been completed.   Preliminary results in CV Proc.   Abram Sander 02/09/2020 11:42 AM

## 2020-02-09 NOTE — Progress Notes (Signed)
Orthopedic Tech Progress Note Patient Details:  Anaiah Mcmannis Oct 03, 1951 [id number] Applied while patient was in the ED  Ortho Devices Type of Ortho Device: Haematologist Ortho Device/Splint Location: RLE Ortho Device/Splint Interventions: Ordered, Application   Post Interventions Patient Tolerated: Well Instructions Provided: Care of device, Adjustment of device   Janit Pagan 02/09/2020, 7:06 PM

## 2020-02-09 NOTE — Progress Notes (Signed)
Patient came from ED with foley present. Foley d/c'd per protocol. Patient has no wounds and is on PO lasix. Purewick placed for output documentation.

## 2020-02-09 NOTE — Progress Notes (Signed)
Received handoff from [place] in ED at approx 1815. Informed foley inserted due to need to measure I&O.   Patient arrived to unit at 1845. Patient drowsy, A&O x 3 (disoriented to time). No distress, VS assessed and WDL. No complaints of pain. Observed sangineous vaginal discharge; patient care performed.   Patient stated lives at home with daughter and uses wheelchair and [facility] at baseline. Patient unable to recall when she last fell at home.

## 2020-02-09 NOTE — Consult Note (Signed)
[facility]  INPATIENT CONSULTATION  Reason for Consultation: CKD and hypervolemia Requesting Provider: Dr. Sarajane Jews  HPI: [name] is an 69 y.o. female with HTN, DM, CAD, h/o CVA, HFpEF, CKD 4 who is seen for evaluation and management of CKD in setting of hypervolemia.    She presented to ED last night for LE swelling, pain, erythema of RLE on background of recent edema being managed by ^ po diuretics after IM dose in ED.  She is being treated here for cellulitis with vancomycin.  DVT has been ruled out.  Per family her dyspnea has been improving after recent changes to diuretics.    Follows at [facility] but has missed several recent appts, last being seen 07/2019.  She had R BC AVF placed 07/2019.  PMH: Past Medical History:  Diagnosis Date  . Anemia   . Arthritis    "right shoulder" (12/19/2016)  . CAD S/P percutaneous coronary angioplasty 5/110    status post PCI to the RCA for inferior STEMI  . Cervical cancer ([place])   . Chronic diastolic heart failure, NYHA class 2 ([place])    Echocardiogram 06/2014:  Technically difficult study. Normal LV size with mild LVH. EF 55-60%. Moderate diastolic dysfunction, normal RV size and function. Likely aortic sclerosis without stenosis  . Chronic lower back pain   . CKD (chronic kidney disease), stage IV ([place])    Recent acute on chronic exacerbation in July 2014  . Depression   . Diabetes mellitus, type II, insulin dependent ([place])    With complications - CAD, CVA, peripheral ulcer ,  . Diabetic foot ulcer ([place])   . Diabetic peripheral neuropathy associated with type 2 diabetes mellitus ([place])   . 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 History of blood transfusion    "w/hysterectomy"   . Hyperlipidemia LDL goal <70   . Hypertension associated with diabetes ([place])   . Obesity, Class III, BMI 40-49.9 (morbid obesity) (HCC)    BMI 43; 5' 2',  235 pounds 6.4 ounces  . On home oxygen therapy    "2.5L  prn; sometimes as little as once/month" (12/19/2016)  . PAD (peripheral artery disease) ([place])     -- Most recent Dopplers April 2016 Status post left BKA. Known right PTA occlusion  . Pneumonia    "childhood"  . ST elevation myocardial infarction (STEMI) of inferior wall ([place])  03/2009   Ostial/proximal RCA occlusion treated with BMS stent  . Stroke/cerebrovascular accident ([place]) 03/18/2013; 12/19/2016   a. Right-sided weakness, mostly with balance issues and only mild weakness;; b. Slurring speach -- L Posterior Putamen CVA   PSH: Past Surgical History:  Procedure Laterality Date  . ABDOMINAL HYSTERECTOMY    . AMPUTATION Left 08/07/2013   Procedure: left midfoot amputation;  Surgeon: Marianna Payment, MD;  Location: [facility];  Service: Orthopedics;  Laterality: Left;  . AMPUTATION Left 08/09/2013   Procedure: AMPUTATION BELOW KNEE;  Surgeon: Marianna Payment, MD;  Location: [facility];  Service: Orthopedics;  Laterality: Left;  . AV FISTULA PLACEMENT Left 12/03/2014   Procedure: ARTERIOVENOUS (AV) FISTULA CREATION;  Surgeon: Angelia Mould, MD;  Location: [facility];  Service: Vascular;  Laterality: Left;  . AV FISTULA PLACEMENT Right 08/12/2019   Procedure: ARTERIOVENOUS (AV) FISTULA CREATION;  Surgeon: Marty Heck, MD;  Location: [facility];  Service: Vascular;  Laterality: Right;  . CARDIAC CATHETERIZATION  03/31/2009   Proximal  RCA thrombotic occlusion (inferior STEMI) other coronaries but in a codominant system  . EYE SURGERY     "bleeding; vessels leaking"  . [place]   "broke her leg in 2 places when she was young"  . LEG SURGERY     hole in bone( during Childhood)  . LEG TENDON SURGERY Right 2000s   patient fell @ [place]  . PERCUTANEOUS CORONARY STENT INTERVENTION (PCI-S)  03/31/2009   PTCA , bare-metal stent 3.5 mm x 24 mm ostium of RCA ,EF 55%  . TRANSTHORACIC ECHOCARDIOGRAM  06/2014; 12/2016   a. Technically difficult study. Normal LV size with mild LVH. EF  55-60%. Mod - GR 2 DD. Nl RV size & fxn. ~ Aortic Sclerosis w/ stenosis;; b. LVEF 65-70%, GR 1-2 DD. No RWMA, Mod LA Dilation    Past Medical History:  Diagnosis Date  . Anemia   . Arthritis    "right shoulder" (12/19/2016)  . CAD S/P percutaneous coronary angioplasty 5/110    status post PCI to the RCA for inferior STEMI  . Cervical cancer ([place])   . Chronic diastolic heart failure, NYHA class 2 ([place])    Echocardiogram 06/2014:  Technically difficult study. Normal LV size with mild LVH. EF 55-60%. Moderate diastolic dysfunction, normal RV size and function. Likely aortic sclerosis without stenosis  . Chronic lower back pain   . CKD (chronic kidney disease), stage IV ([place])    Recent acute on chronic exacerbation in July 2014  . Depression   . Diabetes mellitus, type II, insulin dependent ([place])    With complications - CAD, CVA, peripheral ulcer ,  . Diabetic foot ulcer ([place])   . Diabetic peripheral neuropathy associated with type 2 diabetes mellitus ([place])   . 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 History of blood transfusion    "w/hysterectomy"   . Hyperlipidemia LDL goal <70   . Hypertension associated with diabetes ([place])   . Obesity, Class III, BMI 40-49.9 (morbid obesity) (HCC)    BMI 43; 5' 2',  235 pounds 6.4 ounces  . On home oxygen therapy    "2.5L prn; sometimes as little as once/month" (12/19/2016)  . PAD (peripheral artery disease) ([place])     -- Most recent Dopplers April 2016 Status post left BKA. Known right PTA occlusion  . Pneumonia    "childhood"  . ST elevation myocardial infarction (STEMI) of inferior wall ([place])  03/2009   Ostial/proximal RCA occlusion treated with BMS stent  . Stroke/cerebrovascular accident ([place]) 03/18/2013; 12/19/2016   a. Right-sided weakness, mostly with balance issues and only mild weakness;; b. Slurring speach -- L Posterior Putamen CVA    Medications:  I have reviewed the patient's current  medications.  (Not in a hospital admission)   ALLERGIES:  No Known Allergies  FAM HX: Family History  Problem Relation Age of Onset  . Alcoholism Mother        Died from complications  . Alcoholism Father        Died from complications  . Cancer Father        Throat  . Pneumonia Father   . Diabetes Father   . Hypertension Father   . Heart disease Other        Unknown, unclear. Patient is not a good historian.  Essentially no pertinent history known    Social History:   reports that she quit smoking about 24  years ago. She has never used smokeless tobacco. She reports that she does not drink alcohol or use drugs.  ROS: 12 system ROS per HPI above  Blood pressure (!) 117/43, pulse 60, temperature 98.6 F (37 C), temperature source Oral, resp. rate 15, height 5\' 2"  (1.575 m), weight 95 kg, SpO2 91 %. PHYSICAL EXAM: Gen: chronically ill appearing but nontoxic on stretcher  Eyes: anicteric ENT: MMM Neck: supple CV:  RRR no rub Abd: soft, nontnder Lungs: clear ant and laterally; pt unable to sit up for back auscultation GU: no foley Extr:  1+ edema, L BKA, R with pretibial erythema and 2 shallow ulcerations without purulent drainage; RUE AVF with thrill and bruit near anastamosis but feels very small Neuro:  AOx3 but difficulty with details.    Results for orders placed or performed during the hospital encounter of 02/09/20 (from the past 48 hour(s))  CBC with Differential/Platelet     Status: Abnormal   Collection Time: 02/09/20  1:22 AM  Result Value Ref Range   WBC 5.6 4.0 - 10.5 K/uL   RBC 3.03 (L) 3.87 - 5.11 MIL/uL   Hemoglobin 9.5 (L) 12.0 - 15.0 g/dL   HCT 31.1 (L) 36.0 - 46.0 %   MCV 102.6 (H) 80.0 - 100.0 fL   MCH 31.4 26.0 - 34.0 pg   MCHC 30.5 30.0 - 36.0 g/dL   RDW 15.6 (H) 11.5 - 15.5 %   Platelets 89 (L) 150 - 400 K/uL    Comment: REPEATED TO VERIFY PLATELET COUNT CONFIRMED BY SMEAR SPECIMEN CHECKED FOR CLOTS Immature Platelet Fraction may  be clinically indicated, consider ordering this additional test [id number]    nRBC 0.0 0.0 - 0.2 %   Neutrophils Relative % 79 %   Neutro Abs 4.4 1.7 - 7.7 K/uL   Lymphocytes Relative 7 %   Lymphs Abs 0.4 (L) 0.7 - 4.0 K/uL   Monocytes Relative 14 %   Monocytes Absolute 0.8 0.1 - 1.0 K/uL   Eosinophils Relative 0 %   Eosinophils Absolute 0.0 0.0 - 0.5 K/uL   Basophils Relative 0 %   Basophils Absolute 0.0 0.0 - 0.1 K/uL   WBC Morphology MILD LEFT SHIFT (1-5% METAS, OCC MYELO, OCC BANDS)    Immature Granulocytes 0 %   Abs Immature Granulocytes 0.02 0.00 - 0.07 K/uL    Comment: Performed at [facility], [address], [place], [place] [address]  Comprehensive metabolic panel     Status: Abnormal   Collection Time: 02/09/20  1:22 AM  Result Value Ref Range   Sodium 141 135 - 145 mmol/L   Potassium 4.1 3.5 - 5.1 mmol/L   Chloride 105 98 - 111 mmol/L   CO2 21 (L) 22 - 32 mmol/L   Glucose, Bld 159 (H) 70 - 99 mg/dL    Comment: Glucose reference range applies only to samples taken after fasting for at least 8 hours.   BUN 68 (H) 8 - 23 mg/dL   Creatinine, Ser 3.98 (H) 0.44 - 1.00 mg/dL   Calcium 9.1 8.9 - 10.3 mg/dL   Total Protein 6.1 (L) 6.5 - 8.1 g/dL   Albumin 2.9 (L) 3.5 - 5.0 g/dL   AST 13 (L) 15 - 41 U/L   ALT 14 0 - 44 U/L   Alkaline Phosphatase 98 38 - 126 U/L   Total Bilirubin 2.2 (H) 0.3 - 1.2 mg/dL   GFR calc non Af Amer 11 (L) >60 mL/min   GFR calc Af  Amer 13 (L) >60 mL/min   Anion gap 15 5 - 15    Comment: Performed at [facility], [place] [address], [place], [place] [address]  Troponin I (High Sensitivity)     Status: Abnormal   Collection Time: 02/09/20  1:22 AM  Result Value Ref Range   Troponin I (High Sensitivity) 23 (H) <18 ng/L    Comment: (NOTE) Elevated high sensitivity troponin I (hsTnI) values and significant  changes across serial measurements may suggest ACS but many other  chronic and acute conditions are known to elevate hsTnI results.   Refer to the "Links" section for chest pain algorithms and additional  guidance. Performed at [facility], [place] [address] [place], [place] [address]   Brain natriuretic peptide     Status: Abnormal   Collection Time: 02/09/20  1:23 AM  Result Value Ref Range   B Natriuretic Peptide 1,476.5 (H) 0.0 - 100.0 pg/mL    Comment: Performed at [facility] [address], [place], [place] [address]  Lactic acid, plasma     Status: Abnormal   Collection Time: 02/09/20  1:23 AM  Result Value Ref Range   Lactic Acid, Venous 3.1 (HH) 0.5 - 1.9 mmol/L    Comment: CRITICAL VALUE NOTED.  VALUE IS CONSISTENT WITH PREVIOUSLY REPORTED AND CALLED VALUE. Performed at [facility], [place] [address], [place], [place] [address]   Blood culture (routine x 2)     Status: None (Preliminary result)   Collection Time: 02/09/20  1:25 AM   Specimen: BLOOD LEFT ARM  Result Value Ref Range   Specimen Description BLOOD LEFT ARM    Special Requests      BOTTLES DRAWN AEROBIC AND ANAEROBIC Blood Culture adequate volume Performed at [facility], [place] [address], [place], [place] [address]    Culture NO GROWTH < 12 HOURS    Report Status PENDING   Respiratory Panel by RT PCR (Flu A&B, Covid) - Nasopharyngeal Swab     Status: None   Collection Time: 02/09/20  3:08 AM   Specimen: Nasopharyngeal Swab  Result Value Ref Range   SARS Coronavirus 2 by RT PCR NEGATIVE NEGATIVE    Comment: (NOTE) SARS-CoV-2 target nucleic acids are NOT DETECTED. The SARS-CoV-2 RNA is generally detectable in upper respiratoy specimens during the acute phase of infection. The lowest concentration of SARS-CoV-2 viral copies this assay can detect is 131 copies/mL. A negative result does not preclude SARS-Cov-2 infection and should not be used as the sole basis for treatment or other patient management decisions. A negative result may occur with  improper specimen collection/handling, submission of specimen  other than nasopharyngeal swab, presence of viral mutation(s) within the areas targeted by this assay, and inadequate number of viral copies (<131 copies/mL). A negative result must be combined with clinical observations, patient history, and epidemiological information. The expected result is Negative. Fact Sheet for Patients:  PinkCheek.be Fact Sheet for Healthcare Providers:  GravelBags.it This test is not yet ap proved or cleared by the [place] FDA and  has been authorized for detection and/or diagnosis of SARS-CoV-2 by FDA under an Emergency Use Authorization (EUA). This EUA will remain  in effect (meaning this test can be used) for the duration of the COVID-19 declaration under Section 564(b)(1) of the Act, 21 U.S.C. section 360bbb-3(b)(1), unless the authorization is terminated or revoked sooner.    Influenza A by PCR NEGATIVE NEGATIVE   Influenza B by PCR  NEGATIVE NEGATIVE    Comment: (NOTE) The Xpert Xpress SARS-CoV-2/FLU/RSV assay is intended as an aid in  the diagnosis of influenza from Nasopharyngeal swab specimens and  should not be used as a sole basis for treatment. Nasal washings and  aspirates are unacceptable for Xpert Xpress SARS-CoV-2/FLU/RSV  testing. Fact Sheet for Patients: PinkCheek.be Fact Sheet for Healthcare Providers: GravelBags.it This test is not yet approved or cleared by the [place] FDA and  has been authorized for detection and/or diagnosis of SARS-CoV-2 by  FDA under an Emergency Use Authorization (EUA). This EUA will remain  in effect (meaning this test can be used) for the duration of the  Covid-19 declaration under Section 564(b)(1) of the Act, 21  U.S.C. section 360bbb-3(b)(1), unless the authorization is  terminated or revoked. Performed at [facility], [place] [address], [place], [place] [address]   Blood  culture (routine x 2)     Status: None (Preliminary result)   Collection Time: 02/09/20  3:15 AM   Specimen: BLOOD LEFT HAND  Result Value Ref Range   Specimen Description BLOOD LEFT HAND    Special Requests      BOTTLES DRAWN AEROBIC ONLY Blood Culture results may not be optimal due to an inadequate volume of blood received in culture bottles   Culture NO GROWTH <12 HOURS    Report Status PENDING   Lactic acid, plasma     Status: Abnormal   Collection Time: 02/09/20  3:16 AM  Result Value Ref Range   Lactic Acid, Venous 2.7 (HH) 0.5 - 1.9 mmol/L    Comment: CRITICAL VALUE NOTED.  VALUE IS CONSISTENT WITH PREVIOUSLY REPORTED AND CALLED VALUE. Performed at [facility], [place] [address], [place], [place] [address]   Troponin I (High Sensitivity)     Status: Abnormal   Collection Time: 02/09/20  3:16 AM  Result Value Ref Range   Troponin I (High Sensitivity) 21 (H) <18 ng/L    Comment: (NOTE) Elevated high sensitivity troponin I (hsTnI) values and significant  changes across serial measurements may suggest ACS but many other  chronic and acute conditions are known to elevate hsTnI results.  Refer to the "Links" section for chest pain algorithms and additional  guidance. Performed at [facility], [place] [address], [place], [place] [address]   Bilirubin, fractionated(tot/dir/indir)     Status: Abnormal   Collection Time: 02/09/20  3:16 AM  Result Value Ref Range   Total Bilirubin 2.2 (H) 0.3 - 1.2 mg/dL   Bilirubin, Direct 1.0 (H) 0.0 - 0.2 mg/dL   Indirect Bilirubin 1.2 (H) 0.3 - 0.9 mg/dL    Comment: Performed at [facility] [address] [place], [place] [address]  CBG monitoring, ED     Status: Abnormal   Collection Time: 02/09/20  8:32 AM  Result Value Ref Range   Glucose-Capillary 147 (H) 70 - 99 mg/dL    Comment: Glucose reference range applies only to samples taken after fasting for at least 8 hours.  CBG monitoring, ED     Status: Abnormal    Collection Time: 02/09/20  1:52 PM  Result Value Ref Range   Glucose-Capillary 145 (H) 70 - 99 mg/dL    Comment: Glucose reference range applies only to samples taken after fasting for at least 8 hours.   Comment 1 Notify RN    Comment 2 Document in Chart     DG Pelvis 1-2 Views  Result Date: 02/09/2020 CLINICAL DATA:  Cellulitis of right lower extremity EXAM: PELVIS - 1-2 VIEW COMPARISON:  None. FINDINGS: There is diffuse osteopenia. Bilateral hip osteoarthritis is seen with superior joint space loss. No definite fracture seen. There is dense vascular calcifications. IMPRESSION: Diffuse osteopenia, however no definite acute osseous abnormality. Electronically Signed   By: Prudencio Pair M.D.   On: 02/09/2020 02:15   DG Tibia/Fibula Right  Result Date: 02/09/2020 CLINICAL DATA:  Right lower extremity drainage and tenderness EXAM: RIGHT TIBIA AND FIBULA - 2 VIEW COMPARISON:  None. FINDINGS: There is no evidence of fracture or other focal bone lesions. Pretibial soft tissue swelling seen. Dense vascular calcifications are noted. There is diffuse osteopenia. IMPRESSION: No acute osseous abnormality.  Pretibial soft tissue swelling. Electronically Signed   By: Prudencio Pair M.D.   On: 02/09/2020 02:15   DG Ankle Complete Right  Result Date: 02/09/2020 CLINICAL DATA:  Fall you will itis EXAM: RIGHT ANKLE - COMPLETE 3+ VIEW COMPARISON:  November 20, 2013 FINDINGS: No fracture or dislocation. Cystic erosive type changes seen within the midfoot and partially visualized MTP joint. Hindfoot osteoarthritis is noted at the tibiotalar joint. Calcaneal enthesophytes. Dense vascular calcifications. There is diffuse soft tissue swelling seen in the pretibial region. IMPRESSION: No acute osseous abnormality pretibial soft tissue swelling. Since 2015 erosive type change within the midfoot which could be due to inflammatory/Charcot arthropathy. Electronically Signed   By: Prudencio Pair M.D.   On: 02/09/2020 02:13   CT  Head Wo Contrast  Result Date: 02/09/2020 CLINICAL DATA:  Leg cellulitis ,cephalopathy EXAM: CT HEAD WITHOUT CONTRAST TECHNIQUE: Contiguous axial images were obtained from the base of the skull through the vertex without intravenous contrast. COMPARISON:  December 19, 2016 FINDINGS: Brain: No evidence of acute territorial infarction, hemorrhage, hydrocephalus,extra-axial collection or mass lesion/mass effect. There is dilatation the ventricles and sulci consistent with age-related atrophy. Low-attenuation changes in the deep white matter consistent with small vessel ischemia. Prior left-sided lacunar infarct involving the corona radiata Vascular: No hyperdense vessel or unexpected calcification. Skull: The skull is intact. No fracture or focal lesion identified. Sinuses/Orbits: The visualized paranasal sinuses and mastoid air cells are clear. The orbits and globes intact. Other: None IMPRESSION: No acute intracranial abnormality. Findings consistent with age related atrophy and chronic small vessel ischemia Electronically Signed   By: Prudencio Pair M.D.   On: 02/09/2020 04:59   DG Chest Portable 1 View  Result Date: 02/09/2020 CLINICAL DATA:  Shortness of breath EXAM: PORTABLE CHEST 1 VIEW COMPARISON:  February 03, 2020 FINDINGS: There is cardiomegaly. Diffusely increased interstitial markings are seen throughout both lungs. No acute osseous abnormality. IMPRESSION: Cardiomegaly and interstitial edema. Electronically Signed   By: Prudencio Pair M.D.   On: 02/09/2020 02:11   DG FEMUR, MIN 2 VIEWS RIGHT  Result Date: 02/09/2020 CLINICAL DATA:  Drainage and tenderness EXAM: RIGHT FEMUR 2 VIEWS COMPARISON:  None. FINDINGS: There is no evidence of fracture or other focal bone lesions. There is diffuse osteopenia. Dense vascular calcifications are noted. Fragmented ossicle seen adjacent to the inferior patellar pole. IMPRESSION: No acute osseous abnormality. Electronically Signed   By: Prudencio Pair M.D.   On:  02/09/2020 02:14   VAS [place] LOWER EXTREMITY VENOUS (DVT)  Result Date: 02/09/2020  Lower Venous DVTStudy Indications: Swelling, and Edema.  Limitations: Patient pain tolerance, patient positioning. Comparison Study: no prior Performing Technologist: Abram Sander RVS  Examination Guidelines: A complete evaluation includes B-mode imaging, spectral Doppler, color Doppler, and power Doppler as needed of all  accessible portions of each vessel. Bilateral testing is considered an integral part of a complete examination. Limited examinations for reoccurring indications may be performed as noted. The reflux portion of the exam is performed with the patient in reverse Trendelenburg.  +---------+---------------+---------+-----------+----------+-------------------+ RIGHT    CompressibilityPhasicitySpontaneityPropertiesThrombus Aging      +---------+---------------+---------+-----------+----------+-------------------+ CFV      Full           Yes      Yes                                      +---------+---------------+---------+-----------+----------+-------------------+ SFJ      Full                                                             +---------+---------------+---------+-----------+----------+-------------------+ FV Prox  Full                                                             +---------+---------------+---------+-----------+----------+-------------------+ FV Mid                  Yes      Yes                  unable to tolerate                                                        compression         +---------+---------------+---------+-----------+----------+-------------------+ FV Distal               Yes      Yes                  unable to tolerate                                                        compression         +---------+---------------+---------+-----------+----------+-------------------+ PFV      Full                                                              +---------+---------------+---------+-----------+----------+-------------------+ POP                     Yes      Yes                  unable to tolerate  compression         +---------+---------------+---------+-----------+----------+-------------------+ PTV      Full                                                             +---------+---------------+---------+-----------+----------+-------------------+ PERO     Full                                                             +---------+---------------+---------+-----------+----------+-------------------+   +----+---------------+---------+-----------+----------+--------------+ LEFTCompressibilityPhasicitySpontaneityPropertiesThrombus Aging +----+---------------+---------+-----------+----------+--------------+ CFV                                              Not visualized +----+---------------+---------+-----------+----------+--------------+     Summary: RIGHT: - There is no evidence of deep vein thrombosis in the lower extremity. However, portions of this examination were limited- see technologist comments above.  - No cystic structure found in the popliteal fossa.   *See table(s) above for measurements and observations. Electronically signed by Deitra Mayo MD on 02/09/2020 at 1:08:31 PM.    Final     Assessment/Plan **cellulitis:  RLE; antibiotics per primary. Wound consulted  **CKD 5:  No uremic symptoms and no current indications for dialysis.  Will follow.  It feels like her AVF isn't mature yet, will do another exam tomorrow but if not will refer back to [facility].   **HFpEF:  Has edema on CXR today still, lasix 80 IV x 1 now then reassess.  Was doing a bit better by report on lasix 80 po BID, may be able to resume tomorrow.  Low na diet, fluid restrict.  **Anemia:  W/u per primary pending.  Hasn't required ESA to date,  monitor, may need.   **Thrombocytopenia:  No bleeding noted, follow.    **DM: per primary  **h/o CAD, h/o CVA  Justin Mend 02/09/2020, 2:15 PM

## 2020-02-09 NOTE — ED Notes (Signed)
Foley insertion site appears fine. 

## 2020-02-09 NOTE — ED Notes (Signed)
Bladder scan 570ml. Notified Santiago Glad NP of findings.

## 2020-02-09 NOTE — ED Notes (Signed)
Spoke with pt's daughter and gave update and let her know pt was being moved upstairs to a room.

## 2020-02-09 NOTE — ED Provider Notes (Signed)
[facility] EMERGENCY DEPARTMENT Provider Note   CSN: [id number] Arrival date & time: 02/09/20  6283     History Chief Complaint  Patient presents with  . Cellulitis    Aura Bibby is a 69 y.o. female.  Patient with history of CAD, CHF, CKD not yet on dialysis, diabetes, hypertension and previous stroke presenting from home with course of pain and drainage to her right lower leg.  She is a poor historian.  States she is here because her right leg is tender and draining and swelling.  She believes is been that way for about a week.  She denies having fever, nausea or vomiting. Chart review shows she was seen in the ED on March 17 and treated for CHF with IM Lasix and discharged home. Patient denies any chest pain or shortness of breath currently.  She denies any fever cough or vomiting.  Patient's daughter has arrived and is able to provide additional history.  She states the patient's leg started becoming red over the past several days.  Patient has had multiple falls recently and may have injured her hip when she fell.  They feel her breathing is at baseline.  Her torsemide was stopped but she is taking furosemide for her CHF.  He was referred back to the ED today for worsening pain and concern for cellulitis.  No fever at home.  The history is provided by the patient and the EMS personnel. The history is limited by the condition of the patient.       Past Medical History:  Diagnosis Date  . Anemia   . Arthritis    "right shoulder" (12/19/2016)  . CAD S/P percutaneous coronary angioplasty 5/110    status post PCI to the RCA for inferior STEMI  . Cervical cancer ([place])   . Chronic diastolic heart failure, NYHA class 2 ([place])    Echocardiogram 06/2014:  Technically difficult study. Normal LV size with mild LVH. EF 55-60%. Moderate diastolic dysfunction, normal RV size and function. Likely aortic sclerosis without stenosis  . Chronic lower back pain   . CKD  (chronic kidney disease), stage IV ([place])    Recent acute on chronic exacerbation in July 2014  . Depression   . Diabetes mellitus, type II, insulin dependent ([place])    With complications - CAD, CVA, peripheral ulcer ,  . Diabetic foot ulcer ([place])   . Diabetic peripheral neuropathy associated with type 2 diabetes mellitus ([place])   . 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 History of blood transfusion    "w/hysterectomy"   . Hyperlipidemia LDL goal <70   . Hypertension associated with diabetes ([place])   . Obesity, Class III, BMI 40-49.9 (morbid obesity) (HCC)    BMI 43; 5' 2',  235 pounds 6.4 ounces  . On home oxygen therapy    "2.5L prn; sometimes as little as once/month" (12/19/2016)  . PAD (peripheral artery disease) ([place])     -- Most recent Dopplers April 2016 Status post left BKA. Known right PTA occlusion  . Pneumonia    "childhood"  . ST elevation myocardial infarction (STEMI) of inferior wall ([place])  03/2009   Ostial/proximal RCA occlusion treated with BMS stent  . Stroke/cerebrovascular accident ([place]) 03/18/2013; 12/19/2016   a. Right-sided weakness, mostly with balance issues and only mild weakness;; b. Slurring speach -- L Posterior Putamen CVA    Patient Active Problem List  Diagnosis Date Noted  . AKI (acute kidney injury) ([place]) 08/10/2019  . Hypoglycemia 08/09/2019  . Hyperkalemia 08/09/2019  . Lactic acidosis 08/09/2019  . Hypothermia 08/09/2019  . CVA (cerebral vascular accident) ([place]) 12/19/2016  . S/P BKA (below knee amputation) unilateral ([place]) 08/06/2014  . Chronic respiratory failure with hypoxia ([place]) 08/06/2014  . Essential hypertension 06/23/2014  . Chronic diastolic congestive heart failure, NYHA class 2 ([place]) 01/12/2014  . UTI (lower urinary tract infection) 01/11/2014  . Anemia in chronic kidney disease 09/25/2013  . Hyponatremia 08/06/2013  . Chronic kidney disease (CKD), stage IV (severe) ([place]) 08/06/2013  . Severe  obesity (BMI >= 40) (HCC) 06/22/2013    Class: Chronic  . Peripheral arterial disease: s/p L BKA; Occluded R Pop A, 2 V runnoff 05/01/2013  . Hyperlipidemia LDL goal <70   . Hypertension associated with diabetes ([place])   . Unspecified constipation 04/16/2013  . Type 2 diabetes mellitus with renal manifestations ([place]) 04/16/2013  . Secondary renovascular hypertension, benign 04/09/2013  . Nausea and vomiting 03/18/2013  . Anemia of chronic disease 03/18/2013  . CVA (cerebral infarction) 03/18/2013  . Non-ketotic hyperosmolar coma ([place]) 03/18/2013  . CAD S/P PCI TO RCA for Inferior STEMI. BMS 03/31/2009  . Status post myocardial infarction of inferior wall 03/31/2009    Past Surgical History:  Procedure Laterality Date  . ABDOMINAL HYSTERECTOMY    . AMPUTATION Left 08/07/2013   Procedure: left midfoot amputation;  Surgeon: Marianna Payment, MD;  Location: [facility];  Service: Orthopedics;  Laterality: Left;  . AMPUTATION Left 08/09/2013   Procedure: AMPUTATION BELOW KNEE;  Surgeon: Marianna Payment, MD;  Location: [facility];  Service: Orthopedics;  Laterality: Left;  . AV FISTULA PLACEMENT Left 12/03/2014   Procedure: ARTERIOVENOUS (AV) FISTULA CREATION;  Surgeon: Angelia Mould, MD;  Location: [place];  Service: Vascular;  Laterality: Left;  . AV FISTULA PLACEMENT Right 08/12/2019   Procedure: ARTERIOVENOUS (AV) FISTULA CREATION;  Surgeon: Marty Heck, MD;  Location: [place];  Service: Vascular;  Laterality: Right;  . CARDIAC CATHETERIZATION  03/31/2009   Proximal RCA thrombotic occlusion (inferior STEMI) other coronaries but in a codominant system  . EYE SURGERY     "bleeding; vessels leaking"  . [place]   "broke her leg in 2 places when she was young"  . LEG SURGERY     hole in bone( during Childhood)  . LEG TENDON SURGERY Right 2000s   patient fell @ [place]  . PERCUTANEOUS CORONARY STENT INTERVENTION (PCI-S)  03/31/2009   PTCA , bare-metal stent 3.5 mm x 24 mm  ostium of RCA ,EF 55%  . TRANSTHORACIC ECHOCARDIOGRAM  06/2014; 12/2016   a. Technically difficult study. Normal LV size with mild LVH. EF 55-60%. Mod - GR 2 DD. Nl RV size & fxn. ~ Aortic Sclerosis w/ stenosis;; b. LVEF 65-70%, GR 1-2 DD. No RWMA, Mod LA Dilation     OB History   No obstetric history on file.     Family History  Problem Relation Age of Onset  . Alcoholism Mother        Died from complications  . Alcoholism Father        Died from complications  . Cancer Father        Throat  . Pneumonia Father   . Diabetes Father   . Hypertension Father   . Heart disease Other        Unknown, unclear. Patient is not a good historian.  Essentially no pertinent history known    Social History   Tobacco Use  . Smoking status: Former Smoker    Quit date: 04/29/1995    Years since quitting: 24.8  . Smokeless tobacco: Never Used  Substance Use Topics  . Alcohol use: No  . Drug use: No    Home Medications Prior to Admission medications   Medication Sig Start Date End Date Taking? Authorizing Provider  atorvastatin (LIPITOR) 80 MG tablet Take 1 tablet (80 mg total) by mouth daily at 6 PM. 12/20/16   Alphonzo Grieve, MD  buPROPion (WELLBUTRIN XL) 300 MG 24 hr tablet Take 300 mg by mouth daily.    [provider]  carvedilol (COREG) 25 MG tablet Take 25 mg by mouth every 12 (twelve) hours.  11/23/14   [provider]  clopidogrel (PLAVIX) 75 MG tablet Take 1 tablet (75 mg total) by mouth daily. 12/20/16   Alphonzo Grieve, MD  colchicine 0.6 MG tablet Take 0.6 mg by mouth daily.  03/19/18   [provider]  ezetimibe (ZETIA) 10 MG tablet Take 1 tablet (10 mg total) by mouth daily. Patient not taking: Reported on 08/09/2019 02/21/17 05/22/17  Leonie Man, MD  febuxostat (ULORIC) 40 MG tablet Take 40 mg by mouth daily. 01/06/20   [provider]  furosemide (LASIX) 40 MG tablet Take 80 mg by mouth 2 (two) times daily.  08/10/14   Verlee Monte, MD  glucose  blood (ACCU-CHEK AVIVA PLUS) test strip 1 each by Other route See admin instructions. Use one strip to check glucose before meals and at bedtime. 06/18/14   [provider]  insulin aspart (NOVOLOG) 100 UNIT/ML injection Inject 0-9 Units into the skin 3 (three) times daily before meals. 0-9 Units, Subcutaneous, 3 times daily with meals CBG < 70: Implement Hypoglycemia measures CBG 70 - 120: 0 units CBG 121 - 150: 1 unit CBG 151 - 200: 2 units CBG 201 - 250: 3 units CBG 251 - 300: 5 units CBG 301 - 350: 7 units CBG 351 - 400: 9 units CBG > 400: call MD Patient not taking: Reported on 02/03/2020 08/16/19   Lavina Hamman, MD  insulin lispro (HUMALOG) 100 UNIT/ML KwikPen Inject 0-9 Units into the skin 3 (three) times daily.  08/14/19   [provider]  Insulin Syringe-Needle U-100 (INSULIN SYRINGE .3CC/31GX5/16") 31G X 5/16" 0.3 ML MISC 1 Act by Does not apply route 4 (four) times daily -  before meals and at bedtime. Patient taking differently: 1 each by Other route 4 (four) times daily -  before meals and at bedtime.  08/16/19   Lavina Hamman, MD  metaxalone (SKELAXIN) 800 MG tablet Take 0.5-1 tablets (400-800 mg total) by mouth 3 (three) times daily. Patient not taking: Reported on 02/03/2020 07/02/17   Lysbeth Penner, FNP  Omega-3 Fatty Acids (FISH OIL) 1000 MG CAPS Take 1,000 mg by mouth 2 (two) times daily.    [provider]  OXYGEN Inhale 2.5 L into the lungs as needed (Breathing.).     [provider]  torsemide (DEMADEX) 5 MG tablet Take 5 mg by mouth in the morning and at bedtime. 11/30/19   [provider]    Allergies    Patient has no known allergies.  Review of Systems   Review of Systems  Unable to perform ROS: Mental status change    Physical Exam Updated Vital Signs BP 136/60   Pulse 65   Resp 14  Ht 5\' 2"  (1.575 m)   Wt 95 kg   SpO2 98%   BMI 38.31 kg/m   Physical Exam Vitals and nursing note reviewed.  Constitutional:       General: She is not in acute distress.    Appearance: She is well-developed. She is obese.     Comments: Chronically ill-appearing, somewhat slow to respond  HENT:     Head: Normocephalic and atraumatic.     Mouth/Throat:     Pharynx: No oropharyngeal exudate.  Eyes:     Conjunctiva/sclera: Conjunctivae normal.     Pupils: Pupils are equal, round, and reactive to light.  Neck:     Comments: No meningismus. Cardiovascular:     Rate and Rhythm: Normal rate and regular rhythm.     Heart sounds: Normal heart sounds. No murmur.  Pulmonary:     Effort: Pulmonary effort is normal. No respiratory distress.     Breath sounds: Normal breath sounds.  Abdominal:     Palpations: Abdomen is soft.     Tenderness: There is no abdominal tenderness. There is no guarding or rebound.  Musculoskeletal:        General: Swelling and signs of injury present. No tenderness.     Cervical back: Normal range of motion and neck supple.     Right lower leg: Edema present.     Comments: Left BKA Right leg somewhat shortened and externally rotated.  Erythema to right shin with tenderness and apparent ruptured blisters with clear drainage Diffuse tenderness with some ecchymosis across medial ankle.  Intact DP pulse with Doppler  Skin:    General: Skin is warm.  Neurological:     Mental Status: She is alert.     Cranial Nerves: No cranial nerve deficit.     Motor: No abnormal muscle tone.     Coordination: Coordination normal.     Comments: Patient oriented to person and place.  She moves all extremities.  She is somewhat sleepy and slow to respond.  Psychiatric:        Behavior: Behavior normal.       ED Results / Procedures / Treatments   Labs (all labs ordered are listed, but only abnormal results are displayed) Labs Reviewed  CBC WITH DIFFERENTIAL/PLATELET - Abnormal; Notable for the following components:      Result Value   RBC 3.03 (*)    Hemoglobin 9.5 (*)    HCT 31.1 (*)    MCV 102.6  (*)    RDW 15.6 (*)    Platelets 89 (*)    Lymphs Abs 0.4 (*)    All other components within normal limits  COMPREHENSIVE METABOLIC PANEL - Abnormal; Notable for the following components:   CO2 21 (*)    Glucose, Bld 159 (*)    BUN 68 (*)    Creatinine, Ser 3.98 (*)    Total Protein 6.1 (*)    Albumin 2.9 (*)    AST 13 (*)    Total Bilirubin 2.2 (*)    GFR calc non Af Amer 11 (*)    GFR calc Af Amer 13 (*)    All other components within normal limits  BRAIN NATRIURETIC PEPTIDE - Abnormal; Notable for the following components:   B Natriuretic Peptide 1,476.5 (*)    All other components within normal limits  LACTIC ACID, PLASMA - Abnormal; Notable for the following components:   Lactic Acid, Venous 3.1 (*)    All other components within normal limits  TROPONIN  I (HIGH SENSITIVITY) - Abnormal; Notable for the following components:   Troponin I (High Sensitivity) 23 (*)    All other components within normal limits  CULTURE, BLOOD (ROUTINE X 2)  CULTURE, BLOOD (ROUTINE X 2)  RESPIRATORY PANEL BY RT PCR (FLU A&B, COVID)  LACTIC ACID, PLASMA  TROPONIN I (HIGH SENSITIVITY)    EKG EKG Interpretation  Date/Time:  Tuesday February 09 2020 00:43:01 EDT Ventricular Rate:  66 PR Interval:    QRS Duration: 97 QT Interval:  436 QTC Calculation: 457 R Axis:   -15 Text Interpretation: Sinus rhythm Atrial premature complex Borderline left axis deviation Nonspecific T abnormalities, lateral leads No significant change was found Confirmed by Ezequiel Essex [phone]) on 02/09/2020 1:09:03 AM   Radiology DG Pelvis 1-2 Views  Result Date: 02/09/2020 CLINICAL DATA:  Cellulitis of right lower extremity EXAM: PELVIS - 1-2 VIEW COMPARISON:  None. FINDINGS: There is diffuse osteopenia. Bilateral hip osteoarthritis is seen with superior joint space loss. No definite fracture seen. There is dense vascular calcifications. IMPRESSION: Diffuse osteopenia, however no definite acute osseous abnormality.  Electronically Signed   By: Prudencio Pair M.D.   On: 02/09/2020 02:15   DG Tibia/Fibula Right  Result Date: 02/09/2020 CLINICAL DATA:  Right lower extremity drainage and tenderness EXAM: RIGHT TIBIA AND FIBULA - 2 VIEW COMPARISON:  None. FINDINGS: There is no evidence of fracture or other focal bone lesions. Pretibial soft tissue swelling seen. Dense vascular calcifications are noted. There is diffuse osteopenia. IMPRESSION: No acute osseous abnormality.  Pretibial soft tissue swelling. Electronically Signed   By: Prudencio Pair M.D.   On: 02/09/2020 02:15   DG Ankle Complete Right  Result Date: 02/09/2020 CLINICAL DATA:  Fall you will itis EXAM: RIGHT ANKLE - COMPLETE 3+ VIEW COMPARISON:  November 20, 2013 FINDINGS: No fracture or dislocation. Cystic erosive type changes seen within the midfoot and partially visualized MTP joint. Hindfoot osteoarthritis is noted at the tibiotalar joint. Calcaneal enthesophytes. Dense vascular calcifications. There is diffuse soft tissue swelling seen in the pretibial region. IMPRESSION: No acute osseous abnormality pretibial soft tissue swelling. Since 2015 erosive type change within the midfoot which could be due to inflammatory/Charcot arthropathy. Electronically Signed   By: Prudencio Pair M.D.   On: 02/09/2020 02:13   DG Chest Portable 1 View  Result Date: 02/09/2020 CLINICAL DATA:  Shortness of breath EXAM: PORTABLE CHEST 1 VIEW COMPARISON:  February 03, 2020 FINDINGS: There is cardiomegaly. Diffusely increased interstitial markings are seen throughout both lungs. No acute osseous abnormality. IMPRESSION: Cardiomegaly and interstitial edema. Electronically Signed   By: Prudencio Pair M.D.   On: 02/09/2020 02:11   DG FEMUR, MIN 2 VIEWS RIGHT  Result Date: 02/09/2020 CLINICAL DATA:  Drainage and tenderness EXAM: RIGHT FEMUR 2 VIEWS COMPARISON:  None. FINDINGS: There is no evidence of fracture or other focal bone lesions. There is diffuse osteopenia. Dense vascular  calcifications are noted. Fragmented ossicle seen adjacent to the inferior patellar pole. IMPRESSION: No acute osseous abnormality. Electronically Signed   By: Prudencio Pair M.D.   On: 02/09/2020 02:14    Procedures Procedures (including critical care time)  Medications Ordered in ED Medications - No data to display  ED Course  I have reviewed the triage vital signs and the nursing notes.  Pertinent labs & imaging results that were available during my care of the patient were reviewed by me and considered in my medical decision making (see chart for details).    MDM Rules/Calculators/A&P  CHF with recent visit for same.  Here with increasing pain, swelling and drainage to right leg and concern for cellulitis.  Patient does have intact distal pulses.  Her right leg is somewhat shortened and externally rotated  X-rays negative for hip fracture or other fracture in her right lower leg.  Chest x-ray is consistent with CHF.  Patient given dose of IV Lasix and started on antibiotics for her erythema and warmth to her leg with drainage. Creatinine slightly worse than baseline at 3.98. Would benefit from Doppler study which is not available at this time.  Seems to have worsening CHF not responding to outpatient diuretics as well as new onset cellulitis of her lower extremity. Plan for admission for IV diuresis as well as antibiotics. D/w Dr. Myna Hidalgo. Patient and daughter in agreement.   Jaleesa Cervi was evaluated in Emergency Department on 02/09/2020 for the symptoms described in the history of present illness. She was evaluated in the context of the global COVID-19 pandemic, which necessitated consideration that the patient might be at risk for infection with the SARS-CoV-2 virus that causes COVID-19. Institutional protocols and algorithms that pertain to the evaluation of patients at risk for COVID-19 are in a state of rapid change based on information released by regulatory  bodies including the CDC and federal and state organizations. These policies and algorithms were followed during the patient's care in the ED. 8 staples is not Parkinson's to so it is soft.  He has got P waves observed that he is Final Clinical Impression(s) / ED Diagnoses Final diagnoses:  Fall    Rx / DC Orders ED Discharge Orders    None       Lakoda Mcanany, Annie Main, MD 02/09/20 [id number]

## 2020-02-09 NOTE — ED Triage Notes (Signed)
Pt from home c/o cellulitis with purulent drainage in RLE. A&Ox4, GCS 15. Leg tender to palpation, erythema noted. VSS.

## 2020-02-09 NOTE — Progress Notes (Signed)
Pharmacy Antibiotic Note  [name] is a 69 y.o. female admitted on 02/09/2020 with cellulitis.  Pharmacy has been consulted for vancomycin dosing.  Plan: Vancomycin 2gm IV x 1 Will check random level in ~3-5 days to assess for redose F/u cultures and clinical course  Height: 5\' 2"  (157.5 cm) Weight: 209 lb 7 oz (95 kg) IBW/kg (Calculated) : 50.1  No data recorded.  Recent Labs  Lab 02/03/20 1537 02/09/20 0122 02/09/20 0123  WBC 4.8 5.6  --   CREATININE 3.50* 3.98*  --   LATICACIDVEN  --   --  3.1*    Estimated Creatinine Clearance: 14.5 mL/min (A) (by C-G formula based on SCr of 3.98 mg/dL (H)).    No Known Allergies  Thank you for allowing pharmacy to be a part of this patient's care.  Excell Seltzer [place] 02/09/2020 3:01 AM

## 2020-02-09 NOTE — Progress Notes (Addendum)
Progress Note    [name]  SUG:[id number] DOB: 1951/04/01  DOA: 02/09/2020 PCP: Katherina Mires, MD    Brief Narrative:   Chief complaint: Swelling/erythema/drainage from right leg wound  Medical records reviewed and are as summarized below:  [name] is an 69 y.o. female past medical history significant for hypertension, diabetes, CAD, CVA, chronic diastolic heart failure, chronic kidney disease stage IV presents emergency department chief complaint persistent worsening swelling/erythema and drainage from right leg.  Work-up in the emergency department includes a chest x-ray revealing cardiomegaly with interstitial edema with elevated BNP concerning for acute on chronic heart failure.  Patient was provided with vancomycin for cellulitis of the right leg and IV Lasix for acute on chronic heart failure.  Assessment/Plan:   Principal Problem:   Acute on chronic diastolic CHF (congestive heart failure) (HCC) Active Problems:   Acute renal failure superimposed on stage 4 chronic kidney disease (HCC)   Cellulitis of right leg   Type 2 diabetes mellitus with renal manifestations (HCC)   CKD (chronic kidney disease), stage IV (HCC)   Thrombocytopenia (HCC)   Hyperbilirubinemia   CAD S/P PCI TO RCA for Inferior STEMI. BMS   Anemia in chronic kidney disease   CVA (cerebral vascular accident) ([place])   #1.  Cellulitis of her right leg.  Patient reports she had a blister several weeks ago that burst.  She states her diuretics were adjusted in the last couple of days and the swelling has improved the drainage has decreased but she still has some purulent drainage from the open wound that appears to be a blister that has burst.  She is afebrile hemodynamically stable her lactic acid is now within the limits of normal. -Continue vancomycin per pharmacy -Wound consult -Follow lower extremity Doppler results -Follow blood cultures -Continue home Lasix -Monitor closely  #2. Acute on  chronic diastolic heart failure.  Patient had worsening shortness of breath with signs of volume overload prior to presentation that was treated outpatient with a change in her diuretics.  Reports dyspnea with exertion improving but still has some shortness of breath.  BNP elevated.  Chest x-ray with cardiomegaly and interstitial edema.  Home medications include Coreg and Lasix.  She received 40 mg of Lasix IV in the emergency department.  Echo remotely with an EF of 18% grade 1 diastolic dysfunction. -Continue home lasix for now -bladder scan revealed 500cc  -will hold off on additional lasix given repeat bladder scan results -insert foley for accurate intake and output -Monitor intake and output -Continue Coreg -Obtain daily weights  #3. Acute kidney injury superimpose on  Chronic kidney disease stage V.  Patient's creatinine 3.9.  Chart review indicates baseline between 3 5 and 4.0.  Potassium level 4.0 which seems to be close to her baseline as well.  Spoke to the daughter who indicates there has been conversation with nephrology about a "one-time dialysis" situation.  She has right AV fistula that was placed in September.  Has not had any dialysis as of yet.  Of note she was provided with 40 mg of Lasix in the emergency department followed by 80 mg p.o. and the patient has not made any urine. Repeat bladder scan yields 500cc. -will insert foley -Hold off on extra 57m IV lasix for now.  -appreciate nephrology assistance  #4.  Diabetes type 2.  Home medications include Humalog adding scale NovoLog.  Urine glucose on admission 159. -Obtain a hemoglobin A1c -Continue very sensitive sliding scale for  optimal control  #5.  Thrombocytopenia.  Etiology unclear.  Patient with a history but current level is below what appears to be at baseline of 130. No s/sx bleeding. Total bili elevated but ast/alt and alk phos within limits of normal -monitor -OP follow up  #6.  Anemia.  Related to chronic  disease.  Hemoglobin currently 9.5.  Chart review indicates this is close to her baseline.  No sign symptoms of active bleeding. -Monitor  #7.  History of CVA.  Home medications include Plavix and statin. -Continue home meds  #8.  Peripheral artery disease.  Status post left BKA 2015.   Family Communication/Anticipated D/C date and plan/Code Status   DVT prophylaxis: Lovenox ordered. Code Status: Full Code.  Family Communication: daughter on phone Disposition Plan: discharge to home once cellulitis improves and evaluated by nephrology   Medical Consultants:    [facility]   Anti-Infectives:    None  Subjective:   Awake somewhat lethargic.  Reports pain in right lower extremity.  Denies shortness of breath  Objective:    Vitals:   02/09/20 1000 02/09/20 1009 02/09/20 1015 02/09/20 1030  BP: 113/63 113/62 (!) 127/48 (!) 143/62  Pulse: 64 61 64 64  Resp: (!) 21 (!) 21 (!) 22 14  Temp:      TempSrc:      SpO2: 95% 93% 93%   Weight:      Height:        Intake/Output Summary (Last 24 hours) at 02/09/2020 1101 Last data filed at 02/09/2020 8453 Gross per 24 hour  Intake 630.56 ml  Output --  Net 630.56 ml   Filed Weights   02/09/20 0038  Weight: 95 kg    Exam: General: Awake slightly lethargic chronically ill-appearing in no acute distress CV: Regular rate and rhythm no murmur gallop or rub left below the knee amputation right lower extremity edema with erythema and tenderness open draining wound pedal pulses present palpable Respiratory: No increased work of breathing with conversation breath sounds are distant but clear no crackles no wheezes Abdomen: Obese soft positive bowel sounds throughout nontender to palpation no rebound or guarding Neuro: Slightly lethargic but arouses to verbal stimuli.  Oriented x3 speech is slow and slightly slurred due to poor dentition bilateral grip 5 out of 5  Data Reviewed:   I have personally reviewed following labs  and imaging studies:  Labs: Labs show the following:   Basic Metabolic Panel: Recent Labs  Lab 02/03/20 1537 02/09/20 0122  NA 143 141  K 4.2 4.1  CL 110 105  CO2 21* 21*  GLUCOSE 168* 159*  BUN 56* 68*  CREATININE 3.50* 3.98*  CALCIUM 9.1 9.1   GFR Estimated Creatinine Clearance: 14.5 mL/min (A) (by C-G formula based on SCr of 3.98 mg/dL (H)). Liver Function Tests: Recent Labs  Lab 02/09/20 0122 02/09/20 0316  AST 13*  --   ALT 14  --   ALKPHOS 98  --   BILITOT 2.2* 2.2*  PROT 6.1*  --   ALBUMIN 2.9*  --    No results for input(s): LIPASE, AMYLASE in the last 168 hours. No results for input(s): AMMONIA in the last 168 hours. Coagulation profile No results for input(s): INR, PROTIME in the last 168 hours.  CBC: Recent Labs  Lab 02/03/20 1537 02/09/20 0122  WBC 4.8 5.6  NEUTROABS  --  4.4  HGB 9.6* 9.5*  HCT 32.3* 31.1*  MCV 106.6* 102.6*  PLT 136* 89*   Cardiac Enzymes:  No results for input(s): CKTOTAL, CKMB, CKMBINDEX, TROPONINI in the last 168 hours. BNP (last 3 results) No results for input(s): PROBNP in the last 8760 hours. CBG: Recent Labs  Lab 02/03/20 1531 02/09/20 0832  GLUCAP 147* 147*   D-Dimer: No results for input(s): DDIMER in the last 72 hours. Hgb A1c: No results for input(s): HGBA1C in the last 72 hours. Lipid Profile: No results for input(s): CHOL, HDL, LDLCALC, TRIG, CHOLHDL, LDLDIRECT in the last 72 hours. Thyroid function studies: No results for input(s): TSH, T4TOTAL, T3FREE, THYROIDAB in the last 72 hours.  Invalid input(s): FREET3 Anemia work up: No results for input(s): VITAMINB12, FOLATE, FERRITIN, TIBC, IRON, RETICCTPCT in the last 72 hours. Sepsis Labs: Recent Labs  Lab 02/03/20 1537 02/09/20 0122 02/09/20 0123 02/09/20 0316  WBC 4.8 5.6  --   --   LATICACIDVEN  --   --  3.1* 2.7*    Microbiology Recent Results (from the past 240 hour(s))  Blood culture (routine x 2)     Status: None (Preliminary result)     Collection Time: 02/09/20  1:25 AM   Specimen: BLOOD LEFT ARM  Result Value Ref Range Status   Specimen Description BLOOD LEFT ARM  Final   Special Requests   Final    BOTTLES DRAWN AEROBIC AND ANAEROBIC Blood Culture adequate volume Performed at [facility], [place] [address] [place], [place] [address]    Culture NO GROWTH < 12 HOURS  Final   Report Status PENDING  Incomplete  Respiratory Panel by RT PCR (Flu A&B, Covid) - Nasopharyngeal Swab     Status: None   Collection Time: 02/09/20  3:08 AM   Specimen: Nasopharyngeal Swab  Result Value Ref Range Status   SARS Coronavirus 2 by RT PCR NEGATIVE NEGATIVE Final    Comment: (NOTE) SARS-CoV-2 target nucleic acids are NOT DETECTED. The SARS-CoV-2 RNA is generally detectable in upper respiratoy specimens during the acute phase of infection. The lowest concentration of SARS-CoV-2 viral copies this assay can detect is 131 copies/mL. A negative result does not preclude SARS-Cov-2 infection and should not be used as the sole basis for treatment or other patient management decisions. A negative result may occur with  improper specimen collection/handling, submission of specimen other than nasopharyngeal swab, presence of viral mutation(s) within the areas targeted by this assay, and inadequate number of viral copies (<131 copies/mL). A negative result must be combined with clinical observations, patient history, and epidemiological information. The expected result is Negative. Fact Sheet for Patients:  PinkCheek.be Fact Sheet for Healthcare Providers:  GravelBags.it This test is not yet ap proved or cleared by the [place] FDA and  has been authorized for detection and/or diagnosis of SARS-CoV-2 by FDA under an Emergency Use Authorization (EUA). This EUA will remain  in effect (meaning this test can be used) for the duration of the COVID-19 declaration under Section  564(b)(1) of the Act, 21 U.S.C. section 360bbb-3(b)(1), unless the authorization is terminated or revoked sooner.    Influenza A by PCR NEGATIVE NEGATIVE Final   Influenza B by PCR NEGATIVE NEGATIVE Final    Comment: (NOTE) The Xpert Xpress SARS-CoV-2/FLU/RSV assay is intended as an aid in  the diagnosis of influenza from Nasopharyngeal swab specimens and  should not be used as a sole basis for treatment. Nasal washings and  aspirates are unacceptable for Xpert Xpress SARS-CoV-2/FLU/RSV  testing. Fact Sheet for Patients: PinkCheek.be Fact Sheet for Healthcare Providers: GravelBags.it This test is not yet approved  or cleared by the [place] and  has been authorized for detection and/or diagnosis of SARS-CoV-2 by  FDA under an Emergency Use Authorization (EUA). This EUA will remain  in effect (meaning this test can be used) for the duration of the  Covid-19 declaration under Section 564(b)(1) of the Act, 21  U.S.C. section 360bbb-3(b)(1), unless the authorization is  terminated or revoked. Performed at [facility], [place] [address] [place], [place] [address]   Blood culture (routine x 2)     Status: None (Preliminary result)   Collection Time: 02/09/20  3:15 AM   Specimen: BLOOD LEFT HAND  Result Value Ref Range Status   Specimen Description BLOOD LEFT HAND  Final   Special Requests   Final    BOTTLES DRAWN AEROBIC ONLY Blood Culture results may not be optimal due to an inadequate volume of blood received in culture bottles   Culture NO GROWTH <12 HOURS  Final   Report Status PENDING  Incomplete    Procedures and diagnostic studies:  DG Pelvis 1-2 Views  Result Date: 02/09/2020 CLINICAL DATA:  Cellulitis of right lower extremity EXAM: PELVIS - 1-2 VIEW COMPARISON:  None. FINDINGS: There is diffuse osteopenia. Bilateral hip osteoarthritis is seen with superior joint space loss. No definite fracture seen.  There is dense vascular calcifications. IMPRESSION: Diffuse osteopenia, however no definite acute osseous abnormality. Electronically Signed   By: Prudencio Pair M.D.   On: 02/09/2020 02:15   DG Tibia/Fibula Right  Result Date: 02/09/2020 CLINICAL DATA:  Right lower extremity drainage and tenderness EXAM: RIGHT TIBIA AND FIBULA - 2 VIEW COMPARISON:  None. FINDINGS: There is no evidence of fracture or other focal bone lesions. Pretibial soft tissue swelling seen. Dense vascular calcifications are noted. There is diffuse osteopenia. IMPRESSION: No acute osseous abnormality.  Pretibial soft tissue swelling. Electronically Signed   By: Prudencio Pair M.D.   On: 02/09/2020 02:15   DG Ankle Complete Right  Result Date: 02/09/2020 CLINICAL DATA:  Fall you will itis EXAM: RIGHT ANKLE - COMPLETE 3+ VIEW COMPARISON:  November 20, 2013 FINDINGS: No fracture or dislocation. Cystic erosive type changes seen within the midfoot and partially visualized MTP joint. Hindfoot osteoarthritis is noted at the tibiotalar joint. Calcaneal enthesophytes. Dense vascular calcifications. There is diffuse soft tissue swelling seen in the pretibial region. IMPRESSION: No acute osseous abnormality pretibial soft tissue swelling. Since 2015 erosive type change within the midfoot which could be due to inflammatory/Charcot arthropathy. Electronically Signed   By: Prudencio Pair M.D.   On: 02/09/2020 02:13   CT Head Wo Contrast  Result Date: 02/09/2020 CLINICAL DATA:  Leg cellulitis ,cephalopathy EXAM: CT HEAD WITHOUT CONTRAST TECHNIQUE: Contiguous axial images were obtained from the base of the skull through the vertex without intravenous contrast. COMPARISON:  December 19, 2016 FINDINGS: Brain: No evidence of acute territorial infarction, hemorrhage, hydrocephalus,extra-axial collection or mass lesion/mass effect. There is dilatation the ventricles and sulci consistent with age-related atrophy. Low-attenuation changes in the deep white matter  consistent with small vessel ischemia. Prior left-sided lacunar infarct involving the corona radiata Vascular: No hyperdense vessel or unexpected calcification. Skull: The skull is intact. No fracture or focal lesion identified. Sinuses/Orbits: The visualized paranasal sinuses and mastoid air cells are clear. The orbits and globes intact. Other: None IMPRESSION: No acute intracranial abnormality. Findings consistent with age related atrophy and chronic small vessel ischemia Electronically Signed   By: Prudencio Pair M.D.   On: 02/09/2020 04:59   DG  Chest Portable 1 View  Result Date: 02/09/2020 CLINICAL DATA:  Shortness of breath EXAM: PORTABLE CHEST 1 VIEW COMPARISON:  February 03, 2020 FINDINGS: There is cardiomegaly. Diffusely increased interstitial markings are seen throughout both lungs. No acute osseous abnormality. IMPRESSION: Cardiomegaly and interstitial edema. Electronically Signed   By: Prudencio Pair M.D.   On: 02/09/2020 02:11   DG FEMUR, MIN 2 VIEWS RIGHT  Result Date: 02/09/2020 CLINICAL DATA:  Drainage and tenderness EXAM: RIGHT FEMUR 2 VIEWS COMPARISON:  None. FINDINGS: There is no evidence of fracture or other focal bone lesions. There is diffuse osteopenia. Dense vascular calcifications are noted. Fragmented ossicle seen adjacent to the inferior patellar pole. IMPRESSION: No acute osseous abnormality. Electronically Signed   By: Prudencio Pair M.D.   On: 02/09/2020 02:14    Medications:   . atorvastatin  80 mg Oral q1800  . buPROPion  300 mg Oral Daily  . carvedilol  25 mg Oral Q12H  . clopidogrel  75 mg Oral Daily  . colchicine  0.6 mg Oral Daily  . febuxostat  40 mg Oral Daily  . furosemide  80 mg Oral BID  . insulin aspart  0-6 Units Subcutaneous TID WC  . omega-3 acid ethyl esters  1 g Oral BID  . sodium chloride flush  3 mL Intravenous Q12H  . sodium chloride flush  3 mL Intravenous Q12H  . vancomycin variable dose per unstable renal function (pharmacist dosing)   Does not  apply See admin instructions   Continuous Infusions: . sodium chloride       LOS: 0 days   Radene Gunning NP  [facility]   How to contact the [facility] Attending or Consulting provider [place] or covering provider during after hours [place], for this patient?  1. Check the care team in [facility] and look for a) attending/consulting TR[facility] provider listed and b) the [facility] team listed 2. Log into [email or web address] and use [place]'s universal password to access. If you do not have the password, please contact the hospital operator. 3. Locate the [facility] provider you are looking for under [facility] and page to a number that you can be directly reached. 4. If you still have difficulty reaching the provider, please page the [facility] (Director on Call) for the Hospitalists listed on amion for assistance.  02/09/2020, 11:01 AM

## 2020-02-09 NOTE — Progress Notes (Signed)
Spoke to daughter [name] and updated her on her mothers plan of care. Visitation policy explained. All questions answered.

## 2020-02-09 NOTE — Consult Note (Signed)
WOC Nurse Consult Note: Patient receiving care in [facility] ED 036.  Consult completed remotely after review of record, including photo of wounds, and report from Vascular study of RLE--no evidence of DVT Reason for Consult: RLE wound Wound type: secondary to fluid overload, edema to leg Pressure Injury POA: Yes/No/NA Measurement: see photo for wound particulars Wound bed: Drainage (amount, consistency, odor)  Periwound: intact Dressing procedure/placement/frequency: Wash RLE with soap and water. Pat dry. Place foam dressings over the wounds, then call Ortho Tech to apply an unna boot to the extremity. Thank you for the consult. [place] nurse will not follow at this time.  Please re-consult the [place] team if needed.  Val Riles, RN, MSN, CWOCN, CNS-BC, pager [phone]

## 2020-02-10 ENCOUNTER — Observation Stay (HOSPITAL_COMMUNITY): Payer: Medicare Other

## 2020-02-10 DIAGNOSIS — E1122 Type 2 diabetes mellitus with diabetic chronic kidney disease: Secondary | ICD-10-CM | POA: Diagnosis present

## 2020-02-10 DIAGNOSIS — I739 Peripheral vascular disease, unspecified: Secondary | ICD-10-CM | POA: Diagnosis not present

## 2020-02-10 DIAGNOSIS — Z6841 Body Mass Index (BMI) 40.0 and over, adult: Secondary | ICD-10-CM | POA: Diagnosis not present

## 2020-02-10 DIAGNOSIS — D509 Iron deficiency anemia, unspecified: Secondary | ICD-10-CM | POA: Diagnosis present

## 2020-02-10 DIAGNOSIS — E538 Deficiency of other specified B group vitamins: Secondary | ICD-10-CM | POA: Diagnosis present

## 2020-02-10 DIAGNOSIS — Z789 Other specified health status: Secondary | ICD-10-CM | POA: Diagnosis not present

## 2020-02-10 DIAGNOSIS — R17 Unspecified jaundice: Secondary | ICD-10-CM | POA: Diagnosis not present

## 2020-02-10 DIAGNOSIS — I5033 Acute on chronic diastolic (congestive) heart failure: Secondary | ICD-10-CM | POA: Diagnosis not present

## 2020-02-10 DIAGNOSIS — Z7189 Other specified counseling: Secondary | ICD-10-CM | POA: Diagnosis not present

## 2020-02-10 DIAGNOSIS — E1151 Type 2 diabetes mellitus with diabetic peripheral angiopathy without gangrene: Secondary | ICD-10-CM | POA: Diagnosis present

## 2020-02-10 DIAGNOSIS — N2581 Secondary hyperparathyroidism of renal origin: Secondary | ICD-10-CM | POA: Diagnosis present

## 2020-02-10 DIAGNOSIS — L853 Xerosis cutis: Secondary | ICD-10-CM | POA: Diagnosis not present

## 2020-02-10 DIAGNOSIS — I639 Cerebral infarction, unspecified: Secondary | ICD-10-CM | POA: Diagnosis not present

## 2020-02-10 DIAGNOSIS — Z20822 Contact with and (suspected) exposure to covid-19: Secondary | ICD-10-CM | POA: Diagnosis not present

## 2020-02-10 DIAGNOSIS — D696 Thrombocytopenia, unspecified: Secondary | ICD-10-CM | POA: Diagnosis present

## 2020-02-10 DIAGNOSIS — Z66 Do not resuscitate: Secondary | ICD-10-CM | POA: Diagnosis not present

## 2020-02-10 DIAGNOSIS — N185 Chronic kidney disease, stage 5: Secondary | ICD-10-CM | POA: Diagnosis not present

## 2020-02-10 DIAGNOSIS — E440 Moderate protein-calorie malnutrition: Secondary | ICD-10-CM | POA: Diagnosis not present

## 2020-02-10 DIAGNOSIS — T82868A Thrombosis of vascular prosthetic devices, implants and grafts, initial encounter: Secondary | ICD-10-CM | POA: Diagnosis not present

## 2020-02-10 DIAGNOSIS — N186 End stage renal disease: Secondary | ICD-10-CM

## 2020-02-10 DIAGNOSIS — N179 Acute kidney failure, unspecified: Secondary | ICD-10-CM | POA: Diagnosis not present

## 2020-02-10 DIAGNOSIS — G9341 Metabolic encephalopathy: Secondary | ICD-10-CM | POA: Diagnosis not present

## 2020-02-10 DIAGNOSIS — N184 Chronic kidney disease, stage 4 (severe): Secondary | ICD-10-CM | POA: Diagnosis not present

## 2020-02-10 DIAGNOSIS — I2510 Atherosclerotic heart disease of native coronary artery without angina pectoris: Secondary | ICD-10-CM | POA: Diagnosis not present

## 2020-02-10 DIAGNOSIS — Z992 Dependence on renal dialysis: Secondary | ICD-10-CM | POA: Diagnosis not present

## 2020-02-10 DIAGNOSIS — E1165 Type 2 diabetes mellitus with hyperglycemia: Secondary | ICD-10-CM | POA: Diagnosis present

## 2020-02-10 DIAGNOSIS — Z515 Encounter for palliative care: Secondary | ICD-10-CM | POA: Diagnosis not present

## 2020-02-10 DIAGNOSIS — D631 Anemia in chronic kidney disease: Secondary | ICD-10-CM | POA: Diagnosis present

## 2020-02-10 DIAGNOSIS — E785 Hyperlipidemia, unspecified: Secondary | ICD-10-CM | POA: Diagnosis present

## 2020-02-10 DIAGNOSIS — I132 Hypertensive heart and chronic kidney disease with heart failure and with stage 5 chronic kidney disease, or end stage renal disease: Secondary | ICD-10-CM | POA: Diagnosis not present

## 2020-02-10 DIAGNOSIS — K117 Disturbances of salivary secretion: Secondary | ICD-10-CM | POA: Diagnosis present

## 2020-02-10 DIAGNOSIS — E860 Dehydration: Secondary | ICD-10-CM | POA: Diagnosis present

## 2020-02-10 DIAGNOSIS — L03115 Cellulitis of right lower limb: Secondary | ICD-10-CM | POA: Diagnosis not present

## 2020-02-10 LAB — COMPREHENSIVE METABOLIC PANEL
ALT: 14 U/L (ref 0–44)
AST: 11 U/L — ABNORMAL LOW (ref 15–41)
Albumin: 2.5 g/dL — ABNORMAL LOW (ref 3.5–5.0)
Alkaline Phosphatase: 95 U/L (ref 38–126)
Anion gap: 13 (ref 5–15)
BUN: 78 mg/dL — ABNORMAL HIGH (ref 8–23)
CO2: 21 mmol/L — ABNORMAL LOW (ref 22–32)
Calcium: 8.7 mg/dL — ABNORMAL LOW (ref 8.9–10.3)
Chloride: 105 mmol/L (ref 98–111)
Creatinine, Ser: 4.22 mg/dL — ABNORMAL HIGH (ref 0.44–1.00)
GFR calc Af Amer: 12 mL/min — ABNORMAL LOW (ref >60)
GFR calc non Af Amer: 10 mL/min — ABNORMAL LOW (ref >60)
Glucose, Bld: 125 mg/dL — ABNORMAL HIGH (ref 70–99)
Potassium: 3.9 mmol/L (ref 3.5–5.1)
Sodium: 139 mmol/L (ref 135–145)
Total Bilirubin: 2.1 mg/dL — ABNORMAL HIGH (ref 0.3–1.2)
Total Protein: 5.7 g/dL — ABNORMAL LOW (ref 6.5–8.1)

## 2020-02-10 LAB — CBC WITH DIFFERENTIAL/PLATELET
Abs Immature Granulocytes: 0.03 10*3/uL (ref 0.00–0.07)
Basophils Absolute: 0 10*3/uL (ref 0.0–0.1)
Basophils Relative: 0 %
Eosinophils Absolute: 0 10*3/uL (ref 0.0–0.5)
Eosinophils Relative: 0 %
HCT: 26.1 % — ABNORMAL LOW (ref 36.0–46.0)
Hemoglobin: 8.1 g/dL — ABNORMAL LOW (ref 12.0–15.0)
Immature Granulocytes: 0 %
Lymphocytes Relative: 6 %
Lymphs Abs: 0.4 10*3/uL — ABNORMAL LOW (ref 0.7–4.0)
MCH: 31.4 pg (ref 26.0–34.0)
MCHC: 31 g/dL (ref 30.0–36.0)
MCV: 101.2 fL — ABNORMAL HIGH (ref 80.0–100.0)
Monocytes Absolute: 1 10*3/uL (ref 0.1–1.0)
Monocytes Relative: 14 %
Neutro Abs: 5.5 10*3/uL (ref 1.7–7.7)
Neutrophils Relative %: 80 %
Platelets: 74 10*3/uL — ABNORMAL LOW (ref 150–400)
RBC: 2.58 MIL/uL — ABNORMAL LOW (ref 3.87–5.11)
RDW: 15.5 % (ref 11.5–15.5)
WBC: 6.9 10*3/uL (ref 4.0–10.5)
nRBC: 0.3 % — ABNORMAL HIGH (ref 0.0–0.2)

## 2020-02-10 LAB — GLUCOSE, CAPILLARY
Glucose-Capillary: 118 mg/dL — ABNORMAL HIGH (ref 70–99)
Glucose-Capillary: 151 mg/dL — ABNORMAL HIGH (ref 70–99)
Glucose-Capillary: 169 mg/dL — ABNORMAL HIGH (ref 70–99)
Glucose-Capillary: 124 mg/dL — ABNORMAL HIGH (ref 70–99)

## 2020-02-10 LAB — FOLATE: Folate: 5.7 ng/mL — ABNORMAL LOW (ref >5.9)

## 2020-02-10 LAB — IRON AND TIBC
Iron: 14 ug/dL — ABNORMAL LOW (ref 28–170)
Saturation Ratios: 8 % — ABNORMAL LOW (ref 10.4–31.8)
TIBC: 169 ug/dL — ABNORMAL LOW (ref 250–450)
UIBC: 155 ug/dL

## 2020-02-10 LAB — RETICULOCYTES
Immature Retic Fract: 11 % (ref 2.3–15.9)
RBC.: 2.59 MIL/uL — ABNORMAL LOW (ref 3.87–5.11)
Retic Count, Absolute: 36.8 10*3/uL (ref 19.0–186.0)
Retic Ct Pct: 1.4 % (ref 0.4–3.1)

## 2020-02-10 LAB — LACTIC ACID, PLASMA: Lactic Acid, Venous: 3.1 mmol/L (ref 0.5–1.9)

## 2020-02-10 LAB — VITAMIN B12: Vitamin B-12: 2254 pg/mL — ABNORMAL HIGH (ref 180–914)

## 2020-02-10 LAB — FERRITIN: Ferritin: 1383 ng/mL — ABNORMAL HIGH (ref 11–307)

## 2020-02-10 MED ORDER — SODIUM CHLORIDE 0.9 % IV SOLN
510.0000 mg | Freq: Once | INTRAVENOUS | Status: AC
  Administered 2020-02-10: 510 mg via INTRAVENOUS
  Filled 2020-02-10: qty 17

## 2020-02-10 MED ORDER — COLCHICINE 0.6 MG PO TABS
0.3000 mg | ORAL_TABLET | Freq: Every day | ORAL | Status: DC
  Administered 2020-02-11: 0.3 mg via ORAL
  Filled 2020-02-10 (×2): qty 0.5

## 2020-02-10 NOTE — Progress Notes (Signed)
Per phone convo with daughter Leonard Schwartz at 1210; patient is at home with daughters [name] and Barnetta Chapel and either can be contacted.   Spoke with daughter Barnetta Chapel at bedside [phone]; patient needs feeding assistance and agreed with need for soft diet. Patient increased alertness and interaction with daughter's presence, daughter assisted with feeding.   Spoke with Eliseo Squires via phone at approx [phone] and informed of need of soft diet. Informed daughter Barnetta Chapel at bedside, care team awaiting results of vasc u/s, monitoring kidney function and pt. receiving IV ABX for cellulitis.

## 2020-02-10 NOTE — Evaluation (Signed)
Physical Therapy Evaluation Patient Details Name: [name] MRN: [id number] DOB: 1951/01/07 Today's Date: 02/10/2020   History of Present Illness  Pt is a 69 y/o female admitted secondary to RLE cellulitis and volume overload. PMH includes CAD s/p stent, DM, CKD, CVA, dCHF, and L BKA.   Clinical Impression  Pt admitted secondary to problem above with deficits below. Pt only able to provide limited information given cognitive deficits and lethargy. Requiring total A to roll from side to side. Unsafe to attempt further mobility with +1 assist. Feel pt would benefit from SNF level therapies at d/c. Will continue to follow acutely to maximize functional mobility independence and safety.     Follow Up Recommendations SNF;Supervision/Assistance - 24 hour(max HH services if family decides to take pt home)    Equipment Recommendations  Other (comment)(hoyer lift and hoyer lift pad)    Recommendations for Other Services       Precautions / Restrictions Precautions Precautions: Fall;Other (comment) Precaution Comments: L BKA at baseline  Restrictions Weight Bearing Restrictions: No      Mobility  Bed Mobility Overal bed mobility: Needs Assistance Bed Mobility: Rolling Rolling: Total assist         General bed mobility comments: Total A to roll from side to side. Pt only able to help very minimally. Unsafe to perform further mobility with +1 assist.   Transfers                    Ambulation/Gait                Stairs            Wheelchair Mobility    Modified Rankin (Stroke Patients Only)       Balance                                             Pertinent Vitals/Pain Pain Assessment: Faces Faces Pain Scale: Hurts even more Pain Location: RLE with movement  Pain Descriptors / Indicators: Guarding;Grimacing Pain Intervention(s): Limited activity within patient's tolerance;Monitored during session;Repositioned    Home Living  Family/patient expects to be discharged to:: Private residence Living Arrangements: Children Available Help at Discharge: Family;Available PRN/intermittently Type of Home: House Home Access: Level entry     Home Layout: One level Home Equipment: Walker - 2 wheels;Bedside commode;Wheelchair - manual Additional Comments: Info gained from previous admission, as no family present and pt unable to answer questions.     Prior Function Level of Independence: Needs assistance   Gait / Transfers Assistance Needed: requires assist for transfers per RN and per previous notes.   ADL's / Homemaking Assistance Needed: Pt reports independence, but per notes, requires assist for ADL, and IADL        Hand Dominance        Extremity/Trunk Assessment   Upper Extremity Assessment Upper Extremity Assessment: Generalized weakness    Lower Extremity Assessment Lower Extremity Assessment: LLE deficits/detail;RLE deficits/detail RLE Deficits / Details: Limited ROM secondary to pain.  LLE Deficits / Details: L BKA at baseline        Communication   Communication: Expressive difficulties(very soft spoken, garbled speech)  Cognition Arousal/Alertness: Lethargic Behavior During Therapy: Flat affect Overall Cognitive Status: No family/caregiver present to determine baseline cognitive functioning  General Comments: Pt falling asleep easily during session. Required cues to stay awake. Was able to follow simple 1 step commands. Required extended time to answer questions and would sometimes not answer at all. Disoriented to time and situation       General Comments General comments (skin integrity, edema, etc.): No family present     Exercises     Assessment/Plan    PT Assessment Patient needs continued PT services  PT Problem List Decreased strength;Decreased range of motion;Decreased activity tolerance;Decreased balance;Decreased mobility;Decreased  cognition;Decreased knowledge of use of DME;Decreased safety awareness;Decreased knowledge of precautions;Pain       PT Treatment Interventions DME instruction;Functional mobility training;Therapeutic activities;Balance training;Therapeutic exercise;Patient/family education;Wheelchair mobility training    PT Goals (Current goals can be found in the Care Plan section)  Acute Rehab PT Goals PT Goal Formulation: Patient unable to participate in goal setting Time For Goal Achievement: 02/24/20 Potential to Achieve Goals: Fair    Frequency Min 3X/week   Barriers to discharge        Co-evaluation               AM-PAC PT "6 Clicks" Mobility  Outcome Measure Help needed turning from your back to your side while in a flat bed without using bedrails?: Total Help needed moving from lying on your back to sitting on the side of a flat bed without using bedrails?: Total Help needed moving to and from a bed to a chair (including a wheelchair)?: Total Help needed standing up from a chair using your arms (e.g., wheelchair or bedside chair)?: Total Help needed to walk in hospital room?: Total Help needed climbing 3-5 steps with a railing? : Total 6 Click Score: 6    End of Session   Activity Tolerance: Patient limited by lethargy Patient left: in bed;with call bell/phone within reach;with bed alarm set;with nursing/sitter in room Nurse Communication: Mobility status PT Visit Diagnosis: Difficulty in walking, not elsewhere classified (R26.2)    Time: 6578-4696 PT Time Calculation (min) (ACUTE ONLY): 10 min   Charges:   PT Evaluation $PT Eval Moderate Complexity: 1 Mod          Lou Miner, DPT  Acute Rehabilitation Services  Pager: [phone] Office: [phone]   Rudean Hitt 02/10/2020, 5:48 PM

## 2020-02-10 NOTE — Progress Notes (Signed)
AVF duplex completed. Refer to "CV Proc" under chart review to view preliminary results.  02/10/2020 12:40 PM Kelby Aline., MHA, RVT, RDCS, RDMS

## 2020-02-10 NOTE — Progress Notes (Signed)
Paged Chester at [phone]. Please consult PT and OT for patient.  Paged Vann at 1105. Critical lab value: Lactic acid 3.1 on 3/23 & 2.7 on 3/23 @ 0316. Was just notified by lab today @ 1030  Patient left unit for vascular at 1210. Returned to unit at approx 1255.   Paged Vann at 1515.108 mL via bladder scan @ 1440. 1 stool & 1 urine occurrence @ 0954. 202 mL via bladder scan @ 0400 this AM

## 2020-02-10 NOTE — Progress Notes (Addendum)
Progress Note    [name]  SWF:[id number] DOB: September 03, 1951  DOA: 02/09/2020 PCP: Katherina Mires, MD    Brief Narrative:     Medical records reviewed and are as summarized below:  [name] is an 69 y.o. female with PMH including diabetes, CAD, chronic diastolic CHF, CKD 4 presented with progressive redness, edema and pain right lower extremity. Admitted for right leg cellulitis and volume overload.   Assessment/Plan:   Principal Problem:   Acute on chronic diastolic CHF (congestive heart failure) (HCC) Active Problems:   Acute renal failure superimposed on stage 4 chronic kidney disease (HCC)   Type 2 diabetes mellitus with renal manifestations (HCC)   CAD S/P PCI TO RCA for Inferior STEMI. BMS   CKD (chronic kidney disease), stage IV (HCC)   Anemia in chronic kidney disease   CVA (cerebral vascular accident) ([place])   Cellulitis of right leg   Thrombocytopenia (HCC)   Hyperbilirubinemia   Cellulitis of her right leg.   -Patient reports she had a blister several weeks ago that burst.   -She states her diuretics were adjusted in the last couple of days and the swelling has improved the drainage has decreased but she still has some purulent drainage from the open wound that appears to be a blister that has burst.   -Continue vancomycin per pharmacy-no white blood cell count, and with Unna [name] placement unable to monitor redness so we will continue vancomycin today and plan to de-escalate tomorrow if afebrile. Will need close monitoring in light of worsening renal function -Wound consult-Unna [name] was placed yesterday -DVT studies negative -Follow blood cultures   Acute on chronic diastolic heart failure.  Patient had worsening shortness of breath with signs of volume overload prior to presentation that was treated outpatient with a change in her diuretics.   -Reports dyspnea with exertion improving but still has some shortness of breath.   -Chest x-ray with  cardiomegaly and interstitial edema.  -Continue home lasix for now but defer changes to nephrology -Monitor intake and output -Continue Coreg -Obtain daily weights (doubt weights are correct as yesterday she was 210 pounds and today she is 280)  Acute kidney injury superimpose on  Chronic kidney disease stage V.   -baseline between 3 5 and 4.0.  -right AV fistula that was placed in September.   -Has not had any dialysis as of yet.   -appreciate nephrology assistance   Diabetes type 2.  Home medications include Humalog adding scale NovoLog.  -Sliding scale insulin   Thrombocytopenia.  Etiology unclear.  Patient with a history but current level is below what appears to be at baseline of 130 -No Lovenox/heparin Monitor   Anemia.  Related to chronic disease.  Hemoglobin currently 9.5.  Chart review indicates this is close to her baseline.  No sign symptoms of active bleeding. -Defer ESA to nephrology -We will give dose of IV iron  History of CVA.  Home medications include Plavix and statin. -Continue home meds  Peripheral artery disease.  Status post left BKA 2015.  Morbid obesity Body mass index is 51.21 kg/m. -I do not think this is her correct BMI as her weight I think is recorded incorrectly  Family Communication/Anticipated D/C date and plan/Code Status   DVT prophylaxis: Encourage movement, platelets too low for heparin or Lovenox Left leg amputated Right leg and Unna [name] Code Status: Full Code.  Family Communication: update daughter catherine via nurse Disposition Plan: Needs continued hospitalization with close monitoring  of her renal function as creatinine continues to worsen and possible need for hemodialysis to be started   Medical Consultants:    Nephrology     Subjective:   No current complaints of leg pain  Objective:    Vitals:   02/09/20 2229 02/10/20 0101 02/10/20 0350 02/10/20 0718  BP:  (!) 117/46 (!) 131/47 134/60  Pulse:  60 60 (!) 59   Resp:  18 18 20   Temp:  98.6 F (37 C) 98.1 F (36.7 C) 98.2 F (36.8 C)  TempSrc:   Oral Oral  SpO2: 98% 92% 94% 100%  Weight:   127 kg   Height:        Intake/Output Summary (Last 24 hours) at 02/10/2020 1287 Last data filed at 02/09/2020 2046 Gross per 24 hour  Intake 440 ml  Output 875 ml  Net -435 ml   Filed Weights   02/09/20 1854 02/09/20 2041 02/10/20 0350  Weight: 93.9 kg 127.5 kg 127 kg    Exam: In bed, chronically ill-appearing, older than stated age Regular rate and rhythm Right leg with Unna [name] to knee Left leg status post amputation No increased work of breathing Speech difficult to understand but patient able to relay needs  Data Reviewed:   I have personally reviewed following labs and imaging studies:  Labs: Labs show the following:   Basic Metabolic Panel: Recent Labs  Lab 02/03/20 1537 02/03/20 1537 02/09/20 0122 02/10/20 0452  NA 143  --  141 139  K 4.2   < > 4.1 3.9  CL 110  --  105 105  CO2 21*  --  21* 21*  GLUCOSE 168*  --  159* 125*  BUN 56*  --  68* 78*  CREATININE 3.50*  --  3.98* 4.22*  CALCIUM 9.1  --  9.1 8.7*   < > = values in this interval not displayed.   GFR Estimated Creatinine Clearance: 16.3 mL/min (A) (by C-G formula based on SCr of 4.22 mg/dL (H)). Liver Function Tests: Recent Labs  Lab 02/09/20 0122 02/09/20 0316 02/10/20 0452  AST 13*  --  11*  ALT 14  --  14  ALKPHOS 98  --  95  BILITOT 2.2* 2.2* 2.1*  PROT 6.1*  --  5.7*  ALBUMIN 2.9*  --  2.5*   No results for input(s): LIPASE, AMYLASE in the last 168 hours. No results for input(s): AMMONIA in the last 168 hours. Coagulation profile No results for input(s): INR, PROTIME in the last 168 hours.  CBC: Recent Labs  Lab 02/03/20 1537 02/09/20 0122 02/10/20 0452  WBC 4.8 5.6 6.9  NEUTROABS  --  4.4 5.5  HGB 9.6* 9.5* 8.1*  HCT 32.3* 31.1* 26.1*  MCV 106.6* 102.6* 101.2*  PLT 136* 89* 74*   Cardiac Enzymes: No results for input(s): CKTOTAL,  CKMB, CKMBINDEX, TROPONINI in the last 168 hours. BNP (last 3 results) No results for input(s): PROBNP in the last 8760 hours. CBG: Recent Labs  Lab 02/09/20 1352 02/09/20 1720 02/09/20 1849 02/09/20 2126 02/10/20 0627  GLUCAP 145* 138* 132* 145* 118*   D-Dimer: No results for input(s): DDIMER in the last 72 hours. Hgb A1c: Recent Labs    02/09/20 0122  HGBA1C 6.0*   Lipid Profile: No results for input(s): CHOL, HDL, LDLCALC, TRIG, CHOLHDL, LDLDIRECT in the last 72 hours. Thyroid function studies: No results for input(s): TSH, T4TOTAL, T3FREE, THYROIDAB in the last 72 hours.  Invalid input(s): FREET3 Anemia work up: [employer]  02/10/20 0452  VITAMINB12 2,254*  FOLATE 5.7*  FERRITIN 1,383*  TIBC 169*  IRON 14*  RETICCTPCT 1.4   Sepsis Labs: Recent Labs  Lab 02/03/20 1537 02/09/20 0122 02/09/20 0123 02/09/20 0316 02/10/20 0452  WBC 4.8 5.6  --   --  6.9  LATICACIDVEN  --   --  3.1* 2.7*  --     Microbiology Recent Results (from the past 240 hour(s))  Blood culture (routine x 2)     Status: None (Preliminary result)   Collection Time: 02/09/20  1:25 AM   Specimen: BLOOD LEFT ARM  Result Value Ref Range Status   Specimen Description BLOOD LEFT ARM  Final   Special Requests   Final    BOTTLES DRAWN AEROBIC AND ANAEROBIC Blood Culture adequate volume   Culture   Final    NO GROWTH 1 DAY Performed at [facility], [place] [address] [place], [place] [address]    Report Status PENDING  Incomplete  Respiratory Panel by RT PCR (Flu A&B, Covid) - Nasopharyngeal Swab     Status: None   Collection Time: 02/09/20  3:08 AM   Specimen: Nasopharyngeal Swab  Result Value Ref Range Status   SARS Coronavirus 2 by RT PCR NEGATIVE NEGATIVE Final    Comment: (NOTE) SARS-CoV-2 target nucleic acids are NOT DETECTED. The SARS-CoV-2 RNA is generally detectable in upper respiratoy specimens during the acute phase of infection. The lowest concentration of SARS-CoV-2  viral copies this assay can detect is 131 copies/mL. A negative result does not preclude SARS-Cov-2 infection and should not be used as the sole basis for treatment or other patient management decisions. A negative result may occur with  improper specimen collection/handling, submission of specimen other than nasopharyngeal swab, presence of viral mutation(s) within the areas targeted by this assay, and inadequate number of viral copies (<131 copies/mL). A negative result must be combined with clinical observations, patient history, and epidemiological information. The expected result is Negative. Fact Sheet for Patients:  PinkCheek.be Fact Sheet for Healthcare Providers:  GravelBags.it This test is not yet ap proved or cleared by the [place] FDA and  has been authorized for detection and/or diagnosis of SARS-CoV-2 by FDA under an Emergency Use Authorization (EUA). This EUA will remain  in effect (meaning this test can be used) for the duration of the COVID-19 declaration under Section 564(b)(1) of the Act, 21 U.S.C. section 360bbb-3(b)(1), unless the authorization is terminated or revoked sooner.    Influenza A by PCR NEGATIVE NEGATIVE Final   Influenza B by PCR NEGATIVE NEGATIVE Final    Comment: (NOTE) The Xpert Xpress SARS-CoV-2/FLU/RSV assay is intended as an aid in  the diagnosis of influenza from Nasopharyngeal swab specimens and  should not be used as a sole basis for treatment. Nasal washings and  aspirates are unacceptable for Xpert Xpress SARS-CoV-2/FLU/RSV  testing. Fact Sheet for Patients: PinkCheek.be Fact Sheet for Healthcare Providers: GravelBags.it This test is not yet approved or cleared by the [place] FDA and  has been authorized for detection and/or diagnosis of SARS-CoV-2 by  FDA under an Emergency Use Authorization (EUA). This EUA will  remain  in effect (meaning this test can be used) for the duration of the  Covid-19 declaration under Section 564(b)(1) of the Act, 21  U.S.C. section 360bbb-3(b)(1), unless the authorization is  terminated or revoked. Performed at [facility], [place] [address], [place], [place] [address]   Blood culture (routine x 2)  Status: None (Preliminary result)   Collection Time: 02/09/20  3:15 AM   Specimen: BLOOD LEFT HAND  Result Value Ref Range Status   Specimen Description BLOOD LEFT HAND  Final   Special Requests   Final    BOTTLES DRAWN AEROBIC ONLY Blood Culture results may not be optimal due to an inadequate volume of blood received in culture bottles   Culture   Final    NO GROWTH 1 DAY Performed at [facility], [place] [address], [place], [place] [address]    Report Status PENDING  Incomplete    Procedures and diagnostic studies:  DG Pelvis 1-2 Views  Result Date: 02/09/2020 CLINICAL DATA:  Cellulitis of right lower extremity EXAM: PELVIS - 1-2 VIEW COMPARISON:  None. FINDINGS: There is diffuse osteopenia. Bilateral hip osteoarthritis is seen with superior joint space loss. No definite fracture seen. There is dense vascular calcifications. IMPRESSION: Diffuse osteopenia, however no definite acute osseous abnormality. Electronically Signed   By: Prudencio Pair M.D.   On: 02/09/2020 02:15   DG Tibia/Fibula Right  Result Date: 02/09/2020 CLINICAL DATA:  Right lower extremity drainage and tenderness EXAM: RIGHT TIBIA AND FIBULA - 2 VIEW COMPARISON:  None. FINDINGS: There is no evidence of fracture or other focal bone lesions. Pretibial soft tissue swelling seen. Dense vascular calcifications are noted. There is diffuse osteopenia. IMPRESSION: No acute osseous abnormality.  Pretibial soft tissue swelling. Electronically Signed   By: Prudencio Pair M.D.   On: 02/09/2020 02:15   DG Ankle Complete Right  Result Date: 02/09/2020 CLINICAL DATA:  Fall you will itis EXAM: RIGHT ANKLE  - COMPLETE 3+ VIEW COMPARISON:  November 20, 2013 FINDINGS: No fracture or dislocation. Cystic erosive type changes seen within the midfoot and partially visualized MTP joint. Hindfoot osteoarthritis is noted at the tibiotalar joint. Calcaneal enthesophytes. Dense vascular calcifications. There is diffuse soft tissue swelling seen in the pretibial region. IMPRESSION: No acute osseous abnormality pretibial soft tissue swelling. Since 2015 erosive type change within the midfoot which could be due to inflammatory/Charcot arthropathy. Electronically Signed   By: Prudencio Pair M.D.   On: 02/09/2020 02:13   CT Head Wo Contrast  Result Date: 02/09/2020 CLINICAL DATA:  Leg cellulitis ,cephalopathy EXAM: CT HEAD WITHOUT CONTRAST TECHNIQUE: Contiguous axial images were obtained from the base of the skull through the vertex without intravenous contrast. COMPARISON:  December 19, 2016 FINDINGS: Brain: No evidence of acute territorial infarction, hemorrhage, hydrocephalus,extra-axial collection or mass lesion/mass effect. There is dilatation the ventricles and sulci consistent with age-related atrophy. Low-attenuation changes in the deep white matter consistent with small vessel ischemia. Prior left-sided lacunar infarct involving the corona radiata Vascular: No hyperdense vessel or unexpected calcification. Skull: The skull is intact. No fracture or focal lesion identified. Sinuses/Orbits: The visualized paranasal sinuses and mastoid air cells are clear. The orbits and globes intact. Other: None IMPRESSION: No acute intracranial abnormality. Findings consistent with age related atrophy and chronic small vessel ischemia Electronically Signed   By: Prudencio Pair M.D.   On: 02/09/2020 04:59   DG Chest Portable 1 View  Result Date: 02/09/2020 CLINICAL DATA:  Shortness of breath EXAM: PORTABLE CHEST 1 VIEW COMPARISON:  February 03, 2020 FINDINGS: There is cardiomegaly. Diffusely increased interstitial markings are seen throughout  both lungs. No acute osseous abnormality. IMPRESSION: Cardiomegaly and interstitial edema. Electronically Signed   By: Prudencio Pair M.D.   On: 02/09/2020 02:11   DG FEMUR, MIN 2 VIEWS RIGHT  Result Date: 02/09/2020 CLINICAL  DATA:  Drainage and tenderness EXAM: RIGHT FEMUR 2 VIEWS COMPARISON:  None. FINDINGS: There is no evidence of fracture or other focal bone lesions. There is diffuse osteopenia. Dense vascular calcifications are noted. Fragmented ossicle seen adjacent to the inferior patellar pole. IMPRESSION: No acute osseous abnormality. Electronically Signed   By: Prudencio Pair M.D.   On: 02/09/2020 02:14   VAS [place] LOWER EXTREMITY VENOUS (DVT)  Result Date: 02/09/2020  Lower Venous DVTStudy Indications: Swelling, and Edema.  Limitations: Patient pain tolerance, patient positioning. Comparison Study: no prior Performing Technologist: Abram Sander RVS  Examination Guidelines: A complete evaluation includes B-mode imaging, spectral Doppler, color Doppler, and power Doppler as needed of all accessible portions of each vessel. Bilateral testing is considered an integral part of a complete examination. Limited examinations for reoccurring indications may be performed as noted. The reflux portion of the exam is performed with the patient in reverse Trendelenburg.  +---------+---------------+---------+-----------+----------+-------------------+ RIGHT    CompressibilityPhasicitySpontaneityPropertiesThrombus Aging      +---------+---------------+---------+-----------+----------+-------------------+ CFV      Full           Yes      Yes                                      +---------+---------------+---------+-----------+----------+-------------------+ SFJ      Full                                                             +---------+---------------+---------+-----------+----------+-------------------+ FV Prox  Full                                                              +---------+---------------+---------+-----------+----------+-------------------+ FV Mid                  Yes      Yes                  unable to tolerate                                                        compression         +---------+---------------+---------+-----------+----------+-------------------+ FV Distal               Yes      Yes                  unable to tolerate                                                        compression         +---------+---------------+---------+-----------+----------+-------------------+ PFV      Full                                                             +---------+---------------+---------+-----------+----------+-------------------+  POP                     Yes      Yes                  unable to tolerate                                                        compression         +---------+---------------+---------+-----------+----------+-------------------+ PTV      Full                                                             +---------+---------------+---------+-----------+----------+-------------------+ PERO     Full                                                             +---------+---------------+---------+-----------+----------+-------------------+   +----+---------------+---------+-----------+----------+--------------+ LEFTCompressibilityPhasicitySpontaneityPropertiesThrombus Aging +----+---------------+---------+-----------+----------+--------------+ CFV                                              Not visualized +----+---------------+---------+-----------+----------+--------------+     Summary: RIGHT: - There is no evidence of deep vein thrombosis in the lower extremity. However, portions of this examination were limited- see technologist comments above.  - No cystic structure found in the popliteal fossa.   *See table(s) above for measurements and observations.  Electronically signed by Deitra Mayo MD on 02/09/2020 at 1:08:31 PM.    Final     Medications:   . atorvastatin  80 mg Oral q1800  . buPROPion  300 mg Oral Daily  . carvedilol  25 mg Oral Q12H  . clopidogrel  75 mg Oral Daily  . colchicine  0.6 mg Oral Daily  . febuxostat  40 mg Oral Daily  . furosemide  80 mg Oral BID  . insulin aspart  0-6 Units Subcutaneous TID WC  . omega-3 acid ethyl esters  1 g Oral BID  . sodium chloride flush  3 mL Intravenous Q12H  . sodium chloride flush  3 mL Intravenous Q12H  . vancomycin variable dose per unstable renal function (pharmacist dosing)   Does not apply See admin instructions   Continuous Infusions: . sodium chloride       LOS: 0 days   Geradine Girt  [facility]   How to contact the [facility] Attending or Consulting provider [place] or covering provider during after hours [place], for this patient?  1. Check the care team in [facility] and look for a) attending/consulting [facility] provider listed and b) the [facility] team listed 2. Log into [email or web address] and use [place]'s universal password to access. If you do not have the password, please contact the hospital operator. 3. Locate the [facility] provider you are looking for under [facility] and page to  a number that you can be directly reached. 4. If you still have difficulty reaching the provider, please page the [facility] (Director on Call) for the Hospitalists listed on amion for assistance.  02/10/2020, 9:39 AM

## 2020-02-10 NOTE — Progress Notes (Signed)
[facility] Progress Note   Subjective:   Daughter at bedside and other family on conference call.   Long discussion re: renal function.  She's having some confusion - this has happened in a prior admission.    Objective Vitals:   02/10/20 0718 02/10/20 1134 02/10/20 1347 02/10/20 1409  BP: 134/60 (!) 127/57 (!) 129/54 (!) 127/53  Pulse: (!) 59 62 (!) 59 (!) 56  Resp: 20 18 (!) 22   Temp: 98.2 F (36.8 C) 98.2 F (36.8 C)  98.5 F (36.9 C)  TempSrc: Oral Oral  Oral  SpO2: 100% 95% 99% 93%  Weight:      Height:       Physical Exam General: elderly woman who is comfortable Heart: RRR, no rub Lungs: normal WOB, clear anteriorly and laterally Abdomen: soft Extremities: R leg in Unna boot, trace edema Dialysis Access: RUE AVF +t/b near elbow, lose fistula past juxta and feel collateral in mid upper arm. No arm swelling  Additional Objective Labs: Basic Metabolic Panel: Recent Labs  Lab 02/03/20 1537 02/09/20 0122 02/10/20 0452  NA 143 141 139  K 4.2 4.1 3.9  CL 110 105 105  CO2 21* 21* 21*  GLUCOSE 168* 159* 125*  BUN 56* 68* 78*  CREATININE 3.50* 3.98* 4.22*  CALCIUM 9.1 9.1 8.7*   Liver Function Tests: Recent Labs  Lab 02/09/20 0122 02/09/20 0316 02/10/20 0452  AST 13*  --  11*  ALT 14  --  14  ALKPHOS 98  --  95  BILITOT 2.2* 2.2* 2.1*  PROT 6.1*  --  5.7*  ALBUMIN 2.9*  --  2.5*   No results for input(s): LIPASE, AMYLASE in the last 168 hours. CBC: Recent Labs  Lab 02/03/20 1537 02/09/20 0122 02/10/20 0452  WBC 4.8 5.6 6.9  NEUTROABS  --  4.4 5.5  HGB 9.6* 9.5* 8.1*  HCT 32.3* 31.1* 26.1*  MCV 106.6* 102.6* 101.2*  PLT 136* 89* 74*   Blood Culture    Component Value Date/Time   SDES BLOOD LEFT HAND 02/09/2020 0315   SPECREQUEST  02/09/2020 0315    BOTTLES DRAWN AEROBIC ONLY Blood Culture results may not be optimal due to an inadequate volume of blood received in culture bottles   CULT  02/09/2020 0315    NO GROWTH 1  DAY Performed at [facility] [address] [place], [place] [address]    REPTSTATUS PENDING 02/09/2020 0315    Cardiac Enzymes: No results for input(s): CKTOTAL, CKMB, CKMBINDEX, TROPONINI in the last 168 hours. CBG: Recent Labs  Lab 02/09/20 1720 02/09/20 1849 02/09/20 2126 02/10/20 0627 02/10/20 1123  GLUCAP 138* 132* 145* 118* 151*   Iron Studies:  Recent Labs    02/10/20 0452  IRON 14*  TIBC 169*  FERRITIN 1,383*   @lablastinr3 @ Studies/Results: DG Pelvis 1-2 Views  Result Date: 02/09/2020 CLINICAL DATA:  Cellulitis of right lower extremity EXAM: PELVIS - 1-2 VIEW COMPARISON:  None. FINDINGS: There is diffuse osteopenia. Bilateral hip osteoarthritis is seen with superior joint space loss. No definite fracture seen. There is dense vascular calcifications. IMPRESSION: Diffuse osteopenia, however no definite acute osseous abnormality. Electronically Signed   By: Prudencio Pair M.D.   On: 02/09/2020 02:15   DG Tibia/Fibula Right  Result Date: 02/09/2020 CLINICAL DATA:  Right lower extremity drainage and tenderness EXAM: RIGHT TIBIA AND FIBULA - 2 VIEW COMPARISON:  None. FINDINGS: There is no evidence of fracture or other focal bone lesions. Pretibial soft  tissue swelling seen. Dense vascular calcifications are noted. There is diffuse osteopenia. IMPRESSION: No acute osseous abnormality.  Pretibial soft tissue swelling. Electronically Signed   By: Prudencio Pair M.D.   On: 02/09/2020 02:15   DG Ankle Complete Right  Result Date: 02/09/2020 CLINICAL DATA:  Fall you will itis EXAM: RIGHT ANKLE - COMPLETE 3+ VIEW COMPARISON:  November 20, 2013 FINDINGS: No fracture or dislocation. Cystic erosive type changes seen within the midfoot and partially visualized MTP joint. Hindfoot osteoarthritis is noted at the tibiotalar joint. Calcaneal enthesophytes. Dense vascular calcifications. There is diffuse soft tissue swelling seen in the pretibial region. IMPRESSION: No acute osseous  abnormality pretibial soft tissue swelling. Since 2015 erosive type change within the midfoot which could be due to inflammatory/Charcot arthropathy. Electronically Signed   By: Prudencio Pair M.D.   On: 02/09/2020 02:13   CT Head Wo Contrast  Result Date: 02/09/2020 CLINICAL DATA:  Leg cellulitis ,cephalopathy EXAM: CT HEAD WITHOUT CONTRAST TECHNIQUE: Contiguous axial images were obtained from the base of the skull through the vertex without intravenous contrast. COMPARISON:  December 19, 2016 FINDINGS: Brain: No evidence of acute territorial infarction, hemorrhage, hydrocephalus,extra-axial collection or mass lesion/mass effect. There is dilatation the ventricles and sulci consistent with age-related atrophy. Low-attenuation changes in the deep white matter consistent with small vessel ischemia. Prior left-sided lacunar infarct involving the corona radiata Vascular: No hyperdense vessel or unexpected calcification. Skull: The skull is intact. No fracture or focal lesion identified. Sinuses/Orbits: The visualized paranasal sinuses and mastoid air cells are clear. The orbits and globes intact. Other: None IMPRESSION: No acute intracranial abnormality. Findings consistent with age related atrophy and chronic small vessel ischemia Electronically Signed   By: Prudencio Pair M.D.   On: 02/09/2020 04:59   DG Chest Portable 1 View  Result Date: 02/09/2020 CLINICAL DATA:  Shortness of breath EXAM: PORTABLE CHEST 1 VIEW COMPARISON:  February 03, 2020 FINDINGS: There is cardiomegaly. Diffusely increased interstitial markings are seen throughout both lungs. No acute osseous abnormality. IMPRESSION: Cardiomegaly and interstitial edema. Electronically Signed   By: Prudencio Pair M.D.   On: 02/09/2020 02:11   VAS US DUPLEX DIALYSIS ACCESS (AVF, AVG)  Result Date: 02/10/2020 DIALYSIS ACCESS Reason for Exam: Mechanical complication AVF. Access Type: Brachial-cephalic AVF. Comparison Study: No prior study Performing Technologist:  Maudry Mayhew MHA, RDMS, RVT, RDCS  Examination Guidelines: A complete evaluation includes B-mode imaging, spectral Doppler, color Doppler, and power Doppler as needed of all accessible portions of each vessel. Unilateral testing is considered an integral part of a complete examination. Limited examinations for reoccurring indications may be performed as noted.  Findings: +--------------------+----------+-----------------+--------+ AVF                 PSV (cm/s)Flow Vol (mL/min)Comments +--------------------+----------+-----------------+--------+ Native artery inflow   260           917                +--------------------+----------+-----------------+--------+ AVF Anastomosis        257                              +--------------------+----------+-----------------+--------+  +------------+----------+-------------+----------+-----------------------------+ OUTFLOW VEINPSV (cm/s)Diameter (cm)Depth (cm)          Describe            +------------+----------+-------------+----------+-----------------------------+ Prox UA        255        0.71  0.35         competing branch        +------------+----------+-------------+----------+-----------------------------+ Mid UA         170        0.68        0.70         competing branch        +------------+----------+-------------+----------+-----------------------------+ Dist UA        504        0.47        0.27   partially-occlusive thrombus                                                   and competing branch      +------------+----------+-------------+----------+-----------------------------+   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 *See table(s) above for measurements and observations.   --------------------------------------------------------------------------------   Preliminary    DG FEMUR, MIN 2 VIEWS RIGHT  Result Date: 02/09/2020 CLINICAL DATA:  Drainage and tenderness EXAM:  RIGHT FEMUR 2 VIEWS COMPARISON:  None. FINDINGS: There is no evidence of fracture or other focal bone lesions. There is diffuse osteopenia. Dense vascular calcifications are noted. Fragmented ossicle seen adjacent to the inferior patellar pole. IMPRESSION: No acute osseous abnormality. Electronically Signed   By: Prudencio Pair M.D.   On: 02/09/2020 02:14   VAS [place] LOWER EXTREMITY VENOUS (DVT)  Result Date: 02/09/2020  Lower Venous DVTStudy Indications: Swelling, and Edema.  Limitations: Patient pain tolerance, patient positioning. Comparison Study: no prior Performing Technologist: Abram Sander RVS  Examination Guidelines: A complete evaluation includes B-mode imaging, spectral Doppler, color Doppler, and power Doppler as needed of all accessible portions of each vessel. Bilateral testing is considered an integral part of a complete examination. Limited examinations for reoccurring indications may be performed as noted. The reflux portion of the exam is performed with the patient in reverse Trendelenburg.  +---------+---------------+---------+-----------+----------+-------------------+ RIGHT    CompressibilityPhasicitySpontaneityPropertiesThrombus Aging      +---------+---------------+---------+-----------+----------+-------------------+ CFV      Full           Yes      Yes                                      +---------+---------------+---------+-----------+----------+-------------------+ SFJ      Full                                                             +---------+---------------+---------+-----------+----------+-------------------+ FV Prox  Full                                                             +---------+---------------+---------+-----------+----------+-------------------+ FV Mid                  Yes      Yes  unable to tolerate                                                        compression          +---------+---------------+---------+-----------+----------+-------------------+ FV Distal               Yes      Yes                  unable to tolerate                                                        compression         +---------+---------------+---------+-----------+----------+-------------------+ PFV      Full                                                             +---------+---------------+---------+-----------+----------+-------------------+ POP                     Yes      Yes                  unable to tolerate                                                        compression         +---------+---------------+---------+-----------+----------+-------------------+ PTV      Full                                                             +---------+---------------+---------+-----------+----------+-------------------+ PERO     Full                                                             +---------+---------------+---------+-----------+----------+-------------------+   +----+---------------+---------+-----------+----------+--------------+ LEFTCompressibilityPhasicitySpontaneityPropertiesThrombus Aging +----+---------------+---------+-----------+----------+--------------+ CFV                                              Not visualized +----+---------------+---------+-----------+----------+--------------+     Summary: RIGHT: - There is no evidence of deep vein thrombosis in the lower extremity. However, portions of this examination were limited- see technologist comments above.  - No cystic structure found in the popliteal fossa.   *See table(s) above for measurements and observations. Electronically signed by  Deitra Mayo MD on 02/09/2020 at 1:08:31 PM.    Final    Medications: . sodium chloride 250 mL (02/10/20 1338)   . atorvastatin  80 mg Oral q1800  . buPROPion  300 mg Oral Daily  . carvedilol  25 mg Oral Q12H  .  clopidogrel  75 mg Oral Daily  . [START ON 02/11/2020] colchicine  0.3 mg Oral Daily  . febuxostat  40 mg Oral Daily  . furosemide  80 mg Oral BID  . insulin aspart  0-6 Units Subcutaneous TID WC  . omega-3 acid ethyl esters  1 g Oral BID  . sodium chloride flush  3 mL Intravenous Q12H  . sodium chloride flush  3 mL Intravenous Q12H  . vancomycin variable dose per unstable renal function (pharmacist dosing)   Does not apply See admin instructions    Assessment/Plan **cellulitis:  RLE; antibiotics per primary. Wound consulted - has unna boot now.   **CKD 5:  No uremic symptoms and no current indications for dialysis; I think her confusion is delerium not uremia.  Will follow.  It feels like her AVF isn't mature yet, vasc duplex ordered to assess.  Likely will need VVS evaluation.   **HFpEF:  Has edema on CXR on admission; looks ok on RA today.  Home lasix 80 BID for now.  Low na diet, fluid restrict.  **Anemia: Hb 8.1 from 9.5.  Iron deficient but in setting of severe infection hold on IV iron. Folate low, replete per primary.  Hasn't required ESA to date, monitor, may need.   **Thrombocytopenia:  No bleeding noted, follow.  Not on heparin. Per primary.  **DM: per primary  **h/o CAD, h/o CVA  Jannifer Hick MD 02/10/2020, 2:29 PM  [facility] Pager: [phone]

## 2020-02-11 DIAGNOSIS — R5381 Other malaise: Secondary | ICD-10-CM

## 2020-02-11 DIAGNOSIS — Z89512 Acquired absence of left leg below knee: Secondary | ICD-10-CM

## 2020-02-11 DIAGNOSIS — N185 Chronic kidney disease, stage 5: Secondary | ICD-10-CM

## 2020-02-11 DIAGNOSIS — N179 Acute kidney failure, unspecified: Principal | ICD-10-CM

## 2020-02-11 LAB — COMPREHENSIVE METABOLIC PANEL
ALT: 13 U/L (ref 0–44)
AST: 10 U/L — ABNORMAL LOW (ref 15–41)
Albumin: 2.3 g/dL — ABNORMAL LOW (ref 3.5–5.0)
Alkaline Phosphatase: 108 U/L (ref 38–126)
Anion gap: 13 (ref 5–15)
BUN: 92 mg/dL — ABNORMAL HIGH (ref 8–23)
CO2: 20 mmol/L — ABNORMAL LOW (ref 22–32)
Calcium: 8.4 mg/dL — ABNORMAL LOW (ref 8.9–10.3)
Chloride: 106 mmol/L (ref 98–111)
Creatinine, Ser: 4.57 mg/dL — ABNORMAL HIGH (ref 0.44–1.00)
GFR calc Af Amer: 11 mL/min — ABNORMAL LOW (ref >60)
GFR calc non Af Amer: 9 mL/min — ABNORMAL LOW (ref >60)
Glucose, Bld: 108 mg/dL — ABNORMAL HIGH (ref 70–99)
Potassium: 3.9 mmol/L (ref 3.5–5.1)
Sodium: 139 mmol/L (ref 135–145)
Total Bilirubin: 2.4 mg/dL — ABNORMAL HIGH (ref 0.3–1.2)
Total Protein: 5.6 g/dL — ABNORMAL LOW (ref 6.5–8.1)

## 2020-02-11 LAB — CBC
HCT: 26.6 % — ABNORMAL LOW (ref 36.0–46.0)
Hemoglobin: 8 g/dL — ABNORMAL LOW (ref 12.0–15.0)
MCH: 31.4 pg (ref 26.0–34.0)
MCHC: 30.1 g/dL (ref 30.0–36.0)
MCV: 104.3 fL — ABNORMAL HIGH (ref 80.0–100.0)
Platelets: 83 10*3/uL — ABNORMAL LOW (ref 150–400)
RBC: 2.55 MIL/uL — ABNORMAL LOW (ref 3.87–5.11)
RDW: 15.6 % — ABNORMAL HIGH (ref 11.5–15.5)
WBC: 9.2 10*3/uL (ref 4.0–10.5)
nRBC: 0 % (ref 0.0–0.2)

## 2020-02-11 LAB — VANCOMYCIN, RANDOM: Vancomycin Rm: 17

## 2020-02-11 LAB — GLUCOSE, CAPILLARY
Glucose-Capillary: 111 mg/dL — ABNORMAL HIGH (ref 70–99)
Glucose-Capillary: 146 mg/dL — ABNORMAL HIGH (ref 70–99)
Glucose-Capillary: 126 mg/dL — ABNORMAL HIGH (ref 70–99)

## 2020-02-11 MED ORDER — DOXYCYCLINE HYCLATE 100 MG PO TABS
100.0000 mg | ORAL_TABLET | Freq: Two times a day (BID) | ORAL | Status: DC
  Administered 2020-02-11 – 2020-02-17 (×10): 100 mg via ORAL
  Filled 2020-02-11 (×12): qty 1

## 2020-02-11 MED ORDER — VANCOMYCIN HCL 750 MG/150ML IV SOLN
750.0000 mg | INTRAVENOUS | Status: AC
  Administered 2020-02-11: 11:00:00 750 mg via INTRAVENOUS
  Filled 2020-02-11: qty 150

## 2020-02-11 MED ORDER — FOLIC ACID 1 MG PO TABS
1.0000 mg | ORAL_TABLET | Freq: Every day | ORAL | Status: DC
  Administered 2020-02-11 – 2020-02-26 (×13): 1 mg via ORAL
  Filled 2020-02-11 (×15): qty 1

## 2020-02-11 MED ORDER — ALLOPURINOL 100 MG PO TABS
50.0000 mg | ORAL_TABLET | Freq: Every day | ORAL | Status: DC
  Administered 2020-02-11 – 2020-02-26 (×13): 50 mg via ORAL
  Filled 2020-02-11 (×15): qty 1

## 2020-02-11 MED ORDER — DARBEPOETIN ALFA 60 MCG/0.3ML IJ SOSY
60.0000 ug | PREFILLED_SYRINGE | INTRAMUSCULAR | Status: DC
  Administered 2020-02-11 – 2020-02-25 (×2): 60 ug via SUBCUTANEOUS
  Filled 2020-02-11 (×3): qty 0.3

## 2020-02-11 NOTE — Progress Notes (Addendum)
Paged MD and informed him that patient is not drinking and eating. She barely takes her medications. Also, no output all day. Bladder scanner done by RN and NT twice on day shift shows 160 and 138 ml. Per MD continue to do intermitted bladder scanner, no orders at this point.It looks like patient is progressing toward end stage renal disease per MD.

## 2020-02-11 NOTE — Progress Notes (Signed)
Updated daughter about the patient

## 2020-02-11 NOTE — TOC Initial Note (Signed)
Transition of Care [facility]) - Initial/Assessment Note    Patient Details  Name: [name] MRN: [id number] Date of Birth: 11-11-1951  [facility] CM/SW Contact:    Trula Ore, [place] Phone Number: 02/11/2020, 2:48 PM  Clinical Narrative:                 CSW spoke with patient and patients daughter [name] at bedside. [name] had her other two sisters [name] and Rogelio Seen on the telephone to help decide where they would like their mom/patient to go for SNF placement. Patients daughters decided for CSW to fax out initial referrals in the [place] area. CSW will fax out to SNFs in [place] area. Once beds are offered, CSW will provide [name] patients daughter with a list. Patients daughters will let CSW know which SNF they choose once list is provided.  CSW will fax out initial referrals for SNF placement then provide bed offers to patients daughter. TOC team will continue to follow.  Expected Discharge Plan: Skilled Nursing Facility Barriers to Discharge: No Barriers Identified   Patient Goals and CMS Choice Patient states their goals for this hospitalization and ongoing recovery are:: to go to skilled nursing facility CMS Medicare.gov Compare Post Acute Care list provided to:: Patient Represenative (must comment)(Katherine, Shavonne, and Deshawn) Choice offered to / list presented to : Adult Children  Expected Discharge Plan and Services Expected Discharge Plan: [place] arrangements for the past 2 months: Single Family Home                                      Prior Living Arrangements/Services Living arrangements for the past 2 months: Single Family Home Lives with:: Self Patient language and need for interpreter reviewed:: Yes Do you feel safe going back to the place where you live?: No   wants to go to [place]  Need for Family Participation in Patient Care: Yes (Comment) Care giver support  system in place?: Yes (comment)   Criminal Activity/Legal Involvement Pertinent to Current Situation/Hospitalization: No - Comment as needed  Activities of Daily Living Home Assistive Devices/Equipment: Bedside commode/3-in-1, Prosthesis ADL Screening (condition at time of admission) Patient's cognitive ability adequate to safely complete daily activities?: Yes Is the patient deaf or have difficulty hearing?: No Does the patient have difficulty seeing, even when wearing glasses/contacts?: No Does the patient have difficulty concentrating, remembering, or making decisions?: Yes Patient able to express need for assistance with ADLs?: Yes Does the patient have difficulty dressing or bathing?: Yes Independently performs ADLs?: No Communication: Independent Dressing (OT): Needs assistance Is this a change from baseline?: Pre-admission baseline Grooming: Needs assistance Is this a change from baseline?: Pre-admission baseline Feeding: Needs assistance Is this a change from baseline?: Change from baseline, expected to last <3 days Bathing: Needs assistance Is this a change from baseline?: Pre-admission baseline Toileting: Needs assistance Is this a change from baseline?: Pre-admission baseline Walks in Home: Needs assistance Is this a change from baseline?: Pre-admission baseline Does the patient have difficulty walking or climbing stairs?: Yes Weakness of Legs: Both Weakness of Arms/Hands: Both  Permission Sought/Granted Permission sought to share information with : Case Manager, Family Supports, Chartered certified accountant granted to share information with : Yes, Verbal Permission Granted  Share Information with NAME: Deshawn  Permission granted to share info w AGENCY: SNFs  Permission granted to  share info w Relationship: Daughter  Permission granted to share info w Contact Information: Rogelio Seen [phone]  Emotional Assessment Appearance:: Appears stated  age Attitude/Demeanor/Rapport: Gracious Affect (typically observed): Calm Orientation: : Oriented to Situation, Oriented to Place, Oriented to Self Alcohol / Substance Use: Not Applicable Psych Involvement: No (comment)  Admission diagnosis:  Fall [W19.XXXA] Cellulitis of right leg [V40.086] Cellulitis of leg, right [P61.950] AKI (acute kidney injury) ([place]) [N17.9] Patient Active Problem List   Diagnosis Date Noted  . Cellulitis of right leg 02/09/2020  . Thrombocytopenia ([place]) 02/09/2020  . Hyperbilirubinemia 02/09/2020  . AKI (acute kidney injury) ([place]) 08/10/2019  . Hypoglycemia 08/09/2019  . Hyperkalemia 08/09/2019  . Lactic acidosis 08/09/2019  . Hypothermia 08/09/2019  . CVA (cerebral vascular accident) ([place]) 12/19/2016  . S/P BKA (below knee amputation) unilateral ([place]) 08/06/2014  . Chronic respiratory failure with hypoxia ([place]) 08/06/2014  . Essential hypertension 06/23/2014  . Acute on chronic diastolic CHF (congestive heart failure) ([place]) 01/12/2014  . UTI (lower urinary tract infection) 01/11/2014  . Anemia in chronic kidney disease 09/25/2013  . Hyponatremia 08/06/2013  . CKD (chronic kidney disease), stage IV ([place]) 08/06/2013  . Severe obesity (BMI >= 40) ([facility]) 06/22/2013    Class: Chronic  . Peripheral arterial disease: s/p L BKA; Occluded R Pop A, 2 V runnoff 05/01/2013  . Hyperlipidemia LDL goal <70   . Hypertension associated with diabetes ([place])   . Unspecified constipation 04/16/2013  . Type 2 diabetes mellitus with renal manifestations ([place]) 04/16/2013  . Secondary renovascular hypertension, benign 04/09/2013  . Nausea and vomiting 03/18/2013  . Acute renal failure superimposed on stage 4 chronic kidney disease ([place]) 03/18/2013  . Anemia of chronic disease 03/18/2013  . CVA (cerebral infarction) 03/18/2013  . Non-ketotic hyperosmolar coma ([place]) 03/18/2013  . CAD S/P PCI TO RCA for Inferior STEMI. BMS 03/31/2009  . Status post myocardial infarction of  inferior wall 03/31/2009   PCP:  Katherina Mires, MD Pharmacy:   [facility], [place] [place] [place] [address] Phone: [phone] Fax: [place], [place] [address] Phone: [phone] Fax: [phone]     Social Determinants of Health (SDOH) Interventions    Readmission Risk Interventions No flowsheet data found.

## 2020-02-11 NOTE — TOC Initial Note (Signed)
Transition of Care [facility]) - Initial/Assessment Note    Patient Details  Name: [name] MRN: [id number] Date of Birth: 07-04-1951  Transition of Care [facility]) CM/SW Contact:    Carles Collet, RN Phone Number: 02/11/2020, 1:48 PM  Clinical Narrative:          Damaris Schooner w patient's daughter at bedside who was feeding her lunch. She states that the patient lives with her and family.  Discussed dispo, she is unsure between SNF vs HH.  Currently patient does not receive [place] services. She asked to speak to CSW re visitation hours and specifics about facilities and then would like to speak with her family about options. Aileen Pilot CSW to meet with patient.  TOC will continue to follow.            Barriers to Discharge: Continued Medical Work up   Patient Goals and CMS Choice        Expected Discharge Plan and Services         Living arrangements for the past 2 months: Single Family Home                                      Prior Living Arrangements/Services Living arrangements for the past 2 months: Single Family Home Lives with:: Adult Children                   Activities of Daily Living Home Assistive Devices/Equipment: Bedside commode/3-in-1, Prosthesis ADL Screening (condition at time of admission) Patient's cognitive ability adequate to safely complete daily activities?: Yes Is the patient deaf or have difficulty hearing?: No Does the patient have difficulty seeing, even when wearing glasses/contacts?: No Does the patient have difficulty concentrating, remembering, or making decisions?: Yes Patient able to express need for assistance with ADLs?: Yes Does the patient have difficulty dressing or bathing?: Yes Independently performs ADLs?: No Communication: Independent Dressing (OT): Needs assistance Is this a change from baseline?: Pre-admission baseline Grooming: Needs assistance Is this a change from baseline?: Pre-admission baseline Feeding: Needs  assistance Is this a change from baseline?: Change from baseline, expected to last <3 days Bathing: Needs assistance Is this a change from baseline?: Pre-admission baseline Toileting: Needs assistance Is this a change from baseline?: Pre-admission baseline Walks in Home: Needs assistance Is this a change from baseline?: Pre-admission baseline Does the patient have difficulty walking or climbing stairs?: Yes Weakness of Legs: Both Weakness of Arms/Hands: Both  Permission Sought/Granted                  Emotional Assessment              Admission diagnosis:  Fall [W19.XXXA] Cellulitis of right leg [G81.157] Cellulitis of leg, right [W62.035] AKI (acute kidney injury) ([place]) [N17.9] Patient Active Problem List   Diagnosis Date Noted  . Cellulitis of right leg 02/09/2020  . Thrombocytopenia ([place]) 02/09/2020  . Hyperbilirubinemia 02/09/2020  . AKI (acute kidney injury) ([place]) 08/10/2019  . Hypoglycemia 08/09/2019  . Hyperkalemia 08/09/2019  . Lactic acidosis 08/09/2019  . Hypothermia 08/09/2019  . CVA (cerebral vascular accident) ([place]) 12/19/2016  . S/P BKA (below knee amputation) unilateral ([place]) 08/06/2014  . Chronic respiratory failure with hypoxia ([place]) 08/06/2014  . Essential hypertension 06/23/2014  . Acute on chronic diastolic CHF (congestive heart failure) ([place]) 01/12/2014  . UTI (lower urinary tract infection) 01/11/2014  . Anemia in chronic kidney disease 09/25/2013  .  Hyponatremia 08/06/2013  . CKD (chronic kidney disease), stage IV ([place]) 08/06/2013  . Severe obesity (BMI >= 40) ([facility]) 06/22/2013    Class: Chronic  . Peripheral arterial disease: s/p L BKA; Occluded R Pop A, 2 V runnoff 05/01/2013  . Hyperlipidemia LDL goal <70   . Hypertension associated with diabetes ([place])   . Unspecified constipation 04/16/2013  . Type 2 diabetes mellitus with renal manifestations ([place]) 04/16/2013  . Secondary renovascular hypertension, benign 04/09/2013  . Nausea and  vomiting 03/18/2013  . Acute renal failure superimposed on stage 4 chronic kidney disease ([place]) 03/18/2013  . Anemia of chronic disease 03/18/2013  . CVA (cerebral infarction) 03/18/2013  . Non-ketotic hyperosmolar coma ([place]) 03/18/2013  . CAD S/P PCI TO RCA for Inferior STEMI. BMS 03/31/2009  . Status post myocardial infarction of inferior wall 03/31/2009   PCP:  Katherina Mires, MD Pharmacy:   [facility], [address]. [place]. [place] [address] Phone: [phone] Fax: [place], [place] [address] Phone: [phone] Fax: [phone]     Social Determinants of Health (SDOH) Interventions    Readmission Risk Interventions No flowsheet data found.

## 2020-02-11 NOTE — Progress Notes (Signed)
PROGRESS NOTE  [name] QMV:[id number] DOB: 1950/11/27   PCP: Katherina Mires, MD  Patient is from: Home.  Lives with her daughters.  Uses wheelchair at baseline.  Also bedbound for most part  DOA: 02/09/2020 LOS: 1  Brief Narrative / Interim history: 69 year old female with history of DM-2, CAD s/p BMS to RCA, CVA, thrombocytopenia, diastolic CHF, CKD-5, PAD s/p left BKA presenting with progressive redness and edema of right lower extremity, and admitted for right lower extremity cellulitis, fluid overload and acute on chronic diastolic CHF.  Patient was started on IV vancomycin for cellulitis.  Nephrology consulted for AKI.   Subjective: Seen and examined earlier this morning.  No major events overnight or this morning.  She responds no to pain, shortness of breath, GI or UTI symptoms.  Oriented to self, place, month and year but is slow.  Does not appear to be in distress.  Objective: Vitals:   02/11/20 0352 02/11/20 0746 02/11/20 0835 02/11/20 1157  BP:  (!) 150/47 (!) 142/47 (!) 132/58  Pulse: 60 62 60 (!) 54  Resp:  17  17  Temp:  97.7 F (36.5 C)  97.8 F (36.6 C)  TempSrc:  Oral  Oral  SpO2: 97% 92%  96%  Weight:      Height:        Intake/Output Summary (Last 24 hours) at 02/11/2020 1304 Last data filed at 02/11/2020 1050 Gross per 24 hour  Intake 563 ml  Output 500 ml  Net 63 ml   Filed Weights   02/09/20 2041 02/10/20 0350 02/11/20 0300  Weight: 127.5 kg 127 kg 127 kg    Examination:  GENERAL: No apparent distress.  Nontoxic. HEENT: MMM.  Vision and hearing grossly intact.  NECK: Supple.  No apparent JVD.  RESP: On RA.  No IWOB. Good air movement bilaterally. CVS:  RRR. Heart sounds normal.  ABD/GI/GU: Bowel sounds present. Soft. Non tender.  MSK/EXT: Unna boot over RLE.  LLE BKA. SKIN: no apparent skin lesion or wound NEURO: Awake, alert and oriented self, place, month and year.  No apparent focal neuro deficit. PSYCH: Calm. Normal affect.  Limited  insight into her condition.  Procedures:  None  Assessment & Plan: Acute on chronic diastolic CHF complicated by advanced renal failure: Echo in 2018 with EF of 65 to 70%, G1 DD, and moderate LAE.  Presented with SOB, DOE and fluid overload.  CXR with cardiomegaly and interstitial edema.  Only 500 cc with 2 unmeasured voids charted in the last 24 hours.  Renal function and azotemia worse. -Fluid management per nephrology-on p.o. Lasix 80 mg twice daily.  May be time for HD? -Monitor fluid status, renal function and electrolytes. -Sodium and fluid restrictions.  AKI on CKD-5/azotemia: Baseline Cr 3.50 (on 3/17)> 3.98 (admit)> 4.22> 4.57.  BUN 68> 90.  She is on Lasix at home.  Now on vancomycin for RLE cellulitis.  Renal ultrasound without acute finding to explain.  She has right aVF placed in 07/2019.  Never had HD. -Nephrology following.   RLE cellulitis: RLE in Unna boot this morning.  Has been on vancomycin since admission.  No fever or leukocytosis.  DVT studies and blood cultures negative. -De-escalate antibiotic to doxycycline -Continue Unna boot -Appreciate wound care RN input  Controlled DM-2 with CKD-5 and hyperglycemia: A1c 6.0%. Recent Labs    02/10/20 2129 02/11/20 0620 02/11/20 1151  GLUCAP 124* 111* 146*  -Continue current regimen   Normocytic anemia: Baseline Hgb 8-9>> 8.1 (admit)> 8.0.  Anemia panel features both IDA and ACD.  Folic acid 5.7. -ESA and iron per nephrology  History of PAD s/p left BKA -Continue Lipitor, Plavix, Zetia  History of CVA: Stable.  No apparent focal neuro deficits -Lipitor, Plavix and Zetia as above  History of CAD s/p BMS to RCA in 2010: Stable -Continue home cardiac meds  Morbid obesity:Body mass index is 51.21 kg/m. -Lifestyle change to lose weight  Debility/ambulatory dysfunction inpatient with left BKA in 2015-uses wheelchair at baseline. -PT/OT-recommended SNF  Thrombocytopenia: Baseline 120s to 130s> 89 (admit)> 74>  83 -Continue monitoring.  History of gout-on colchicine and Uloric.  Risk of adverse cardiac outcome with Uloric -Prefer low-dose allopurinol. -Continue renally dosed colchicine  Goal of care: Patient with comorbidities as above.  Limited mobility.  Uses wheelchair at baseline.  She is full code.  Now with renal failure. -Consult palliative care             DVT prophylaxis: None in the setting of thrombocytopenia.  She has Haematologist over RLE.  Left BKA. Code Status: Full code Family Communication: Updated patient's daughter, Rogelio Seen over the phone. No answer or VM from [facility]  Discharge barrier: Fluid overload/advanced renal failure/acute on chronic diastolic CHF Patient is from: Home Final disposition: Likely SNF  Consultants: Nephrology   Microbiology summarized: COVID-19 negative   Sch Meds:  Scheduled Meds: . atorvastatin  80 mg Oral q1800  . buPROPion  300 mg Oral Daily  . carvedilol  25 mg Oral Q12H  . clopidogrel  75 mg Oral Daily  . colchicine  0.3 mg Oral Daily  . darbepoetin (ARANESP) injection - NON-DIALYSIS  60 mcg Subcutaneous Q Thu-1800  . febuxostat  40 mg Oral Daily  . furosemide  80 mg Oral BID  . insulin aspart  0-6 Units Subcutaneous TID WC  . omega-3 acid ethyl esters  1 g Oral BID  . sodium chloride flush  3 mL Intravenous Q12H  . sodium chloride flush  3 mL Intravenous Q12H  . vancomycin variable dose per unstable renal function (pharmacist dosing)   Does not apply See admin instructions   Continuous Infusions: . sodium chloride Stopped (02/10/20 1409)   PRN Meds:.sodium chloride, acetaminophen **OR** acetaminophen, ondansetron **OR** ondansetron (ZOFRAN) IV, sodium chloride flush  Antimicrobials: Anti-infectives (From admission, onward)   Start     Dose/Rate Route Frequency Ordered Stop   02/11/20 1015  vancomycin (VANCOREADY) IVPB 750 mg/150 mL     750 mg 150 mL/hr over 60 Minutes Intravenous NOW 02/11/20 0916 02/11/20 1148    02/09/20 0315  vancomycin (VANCOREADY) IVPB 2000 mg/400 mL     2,000 mg 200 mL/hr over 120 Minutes Intravenous  Once 02/09/20 0300 02/09/20 0638   02/09/20 0302  vancomycin variable dose per unstable renal function (pharmacist dosing)      Does not apply See admin instructions 02/09/20 0302     02/09/20 0245  vancomycin (VANCOCIN) IVPB 1000 mg/200 mL premix  Status:  Discontinued     1,000 mg 200 mL/hr over 60 Minutes Intravenous  Once 02/09/20 0243 02/09/20 0300       I have personally reviewed the following labs and images: CBC: Recent Labs  Lab 02/09/20 0122 02/10/20 0452 02/11/20 0420  WBC 5.6 6.9 9.2  NEUTROABS 4.4 5.5  --   HGB 9.5* 8.1* 8.0*  HCT 31.1* 26.1* 26.6*  MCV 102.6* 101.2* 104.3*  PLT 89* 74* 83*   BMP &GFR Recent Labs  Lab 02/09/20 0122 02/10/20 0452 02/11/20 0420  NA 141 139 139  K 4.1 3.9 3.9  CL 105 105 106  CO2 21* 21* 20*  GLUCOSE 159* 125* 108*  BUN 68* 78* 92*  CREATININE 3.98* 4.22* 4.57*  CALCIUM 9.1 8.7* 8.4*   Estimated Creatinine Clearance: 15 mL/min (A) (by C-G formula based on SCr of 4.57 mg/dL (H)). Liver & Pancreas: Recent Labs  Lab 02/09/20 0122 02/09/20 0316 02/10/20 0452 02/11/20 0420  AST 13*  --  11* 10*  ALT 14  --  14 13  ALKPHOS 98  --  95 108  BILITOT 2.2* 2.2* 2.1* 2.4*  PROT 6.1*  --  5.7* 5.6*  ALBUMIN 2.9*  --  2.5* 2.3*   No results for input(s): LIPASE, AMYLASE in the last 168 hours. No results for input(s): AMMONIA in the last 168 hours. Diabetic: Recent Labs    02/09/20 0122  HGBA1C 6.0*   Recent Labs  Lab 02/10/20 1123 02/10/20 1605 02/10/20 2129 02/11/20 0620 02/11/20 1151  GLUCAP 151* 169* 124* 111* 146*   Cardiac Enzymes: No results for input(s): CKTOTAL, CKMB, CKMBINDEX, TROPONINI in the last 168 hours. No results for input(s): PROBNP in the last 8760 hours. Coagulation Profile: No results for input(s): INR, PROTIME in the last 168 hours. Thyroid Function Tests: No results for  input(s): TSH, T4TOTAL, FREET4, T3FREE, THYROIDAB in the last 72 hours. Lipid Profile: No results for input(s): CHOL, HDL, LDLCALC, TRIG, CHOLHDL, LDLDIRECT in the last 72 hours. Anemia Panel: Recent Labs    02/10/20 0452  VITAMINB12 2,254*  FOLATE 5.7*  FERRITIN 1,383*  TIBC 169*  IRON 14*  RETICCTPCT 1.4   Urine analysis:    Component Value Date/Time   COLORURINE YELLOW 08/09/2019 1518   APPEARANCEUR HAZY (A) 08/09/2019 1518   LABSPEC 1.011 08/09/2019 1518   PHURINE 8.0 08/09/2019 1518   GLUCOSEU NEGATIVE 08/09/2019 1518   HGBUR NEGATIVE 08/09/2019 1518   BILIRUBINUR NEGATIVE 08/09/2019 1518   KETONESUR NEGATIVE 08/09/2019 1518   PROTEINUR 30 (A) 08/09/2019 1518   UROBILINOGEN 0.2 08/24/2015 1002   NITRITE NEGATIVE 08/09/2019 1518   LEUKOCYTESUR LARGE (A) 08/09/2019 1518   Sepsis Labs: Invalid input(s): PROCALCITONIN, [place]  Microbiology: Recent Results (from the past 240 hour(s))  Blood culture (routine x 2)     Status: None (Preliminary result)   Collection Time: 02/09/20  1:25 AM   Specimen: BLOOD LEFT ARM  Result Value Ref Range Status   Specimen Description BLOOD LEFT ARM  Final   Special Requests   Final    BOTTLES DRAWN AEROBIC AND ANAEROBIC Blood Culture adequate volume   Culture   Final    NO GROWTH 2 DAYS Performed at [facility], [address], [place], [place] [address]    Report Status PENDING  Incomplete  Respiratory Panel by RT PCR (Flu A&B, Covid) - Nasopharyngeal Swab     Status: None   Collection Time: 02/09/20  3:08 AM   Specimen: Nasopharyngeal Swab  Result Value Ref Range Status   SARS Coronavirus 2 by RT PCR NEGATIVE NEGATIVE Final    Comment: (NOTE) SARS-CoV-2 target nucleic acids are NOT DETECTED. The SARS-CoV-2 RNA is generally detectable in upper respiratoy specimens during the acute phase of infection. The lowest concentration of SARS-CoV-2 viral copies this assay can detect is 131 copies/mL. A negative result does  not preclude SARS-Cov-2 infection and should not be used as the sole basis for treatment or other patient management decisions. A negative result may occur with  improper specimen collection/handling,  submission of specimen other than nasopharyngeal swab, presence of viral mutation(s) within the areas targeted by this assay, and inadequate number of viral copies (<131 copies/mL). A negative result must be combined with clinical observations, patient history, and epidemiological information. The expected result is Negative. Fact Sheet for Patients:  PinkCheek.be Fact Sheet for Healthcare Providers:  GravelBags.it This test is not yet ap proved or cleared by the [place] FDA and  has been authorized for detection and/or diagnosis of SARS-CoV-2 by FDA under an Emergency Use Authorization (EUA). This EUA will remain  in effect (meaning this test can be used) for the duration of the COVID-19 declaration under Section 564(b)(1) of the Act, 21 U.S.C. section 360bbb-3(b)(1), unless the authorization is terminated or revoked sooner.    Influenza A by PCR NEGATIVE NEGATIVE Final   Influenza B by PCR NEGATIVE NEGATIVE Final    Comment: (NOTE) The Xpert Xpress SARS-CoV-2/FLU/RSV assay is intended as an aid in  the diagnosis of influenza from Nasopharyngeal swab specimens and  should not be used as a sole basis for treatment. Nasal washings and  aspirates are unacceptable for Xpert Xpress SARS-CoV-2/FLU/RSV  testing. Fact Sheet for Patients: PinkCheek.be Fact Sheet for Healthcare Providers: GravelBags.it This test is not yet approved or cleared by the [place] FDA and  has been authorized for detection and/or diagnosis of SARS-CoV-2 by  FDA under an Emergency Use Authorization (EUA). This EUA will remain  in effect (meaning this test can be used) for the duration of the    Covid-19 declaration under Section 564(b)(1) of the Act, 21  U.S.C. section 360bbb-3(b)(1), unless the authorization is  terminated or revoked. Performed at [facility], [place] [address] [place], [place] [address]   Blood culture (routine x 2)     Status: None (Preliminary result)   Collection Time: 02/09/20  3:15 AM   Specimen: BLOOD LEFT HAND  Result Value Ref Range Status   Specimen Description BLOOD LEFT HAND  Final   Special Requests   Final    BOTTLES DRAWN AEROBIC ONLY Blood Culture results may not be optimal due to an inadequate volume of blood received in culture bottles   Culture   Final    NO GROWTH 2 DAYS Performed at [facility], [place] [address], [place], [place] [address]    Report Status PENDING  Incomplete    Radiology Studies: No results found.    Ferguson Gertner T. Zebulon  If 7PM-7AM, please contact night-coverage [email or web address] Password [facility] 02/11/2020, 1:04 PM

## 2020-02-11 NOTE — Progress Notes (Signed)
Pharmacy Antibiotic Note  [name] is a 68 y.o. female admitted on 02/09/2020 with cellulitis.  Pharmacy has been consulted for vancomycin dosing - day #3. Noted, patient is CKD5, SCr trending up to 4.57 today. Nephrology following - no current indication for dialysis. Blood cultures are negative to date.  Patient was loaded with vancomycin 2g IV x 1 on 3/23. Vancomycin random level this morning resulted at 17.  Plan: Vancomycin 750mg  IV x 1 F/u renal function trend/Nephrology plans and may consider vancomycin random level prior to re-dosing in 48 hrs Monitor clinical progress, c/s, renal function F/u de-escalation plan/LOT  Height: 5\' 2"  (157.5 cm) Weight: 280 lb (127 kg) IBW/kg (Calculated) : 50.1  Temp (24hrs), Avg:97.9 F (36.6 C), Min:97.5 F (36.4 C), Max:98.5 F (36.9 C)  Recent Labs  Lab 02/09/20 0122 02/09/20 0123 02/09/20 0316 02/10/20 0452 02/11/20 0420  WBC 5.6  --   --  6.9 9.2  CREATININE 3.98*  --   --  4.22* 4.57*  LATICACIDVEN  --  3.1* 2.7*  --   --   VANCORANDOM  --   --   --   --  17    Estimated Creatinine Clearance: 15 mL/min (A) (by C-G formula based on SCr of 4.57 mg/dL (H)).    No Known Allergies   Arturo Morton, PharmD, BCPS Please check AMION for all [place] contact numbers Clinical Pharmacist 02/11/2020 8:56 AM

## 2020-02-11 NOTE — Progress Notes (Signed)
[facility] Progress Note   Subjective:   Required I/O cath this AM with 267mL UOP.    Pt unable to offer much info.  Family not in room but daughter contacted by phone - not eating/drinking much unless she has help to eat but no other new changes.   Objective Vitals:   02/11/20 0352 02/11/20 0746 02/11/20 0835 02/11/20 1157  BP:  (!) 150/47 (!) 142/47 (!) 132/58  Pulse: 60 62 60 (!) 54  Resp:  17  17  Temp:  97.7 F (36.5 C)  97.8 F (36.6 C)  TempSrc:  Oral  Oral  SpO2: 97% 92%  96%  Weight:      Height:       Physical Exam General: elderly woman who is comfortable Heart: RRR, no rub Lungs: normal WOB, clear anteriorly and laterally Abdomen: soft Extremities: R leg in Unna boot, trace edema Dialysis Access: RUE AVF +t/b near elbow, feel collateral in mid upper arm. No arm swelling  Additional Objective Labs: Basic Metabolic Panel: Recent Labs  Lab 02/09/20 0122 02/10/20 0452 02/11/20 0420  NA 141 139 139  K 4.1 3.9 3.9  CL 105 105 106  CO2 21* 21* 20*  GLUCOSE 159* 125* 108*  BUN 68* 78* 92*  CREATININE 3.98* 4.22* 4.57*  CALCIUM 9.1 8.7* 8.4*   Liver Function Tests: Recent Labs  Lab 02/09/20 0122 02/09/20 0122 02/09/20 0316 02/10/20 0452 02/11/20 0420  AST 13*  --   --  11* 10*  ALT 14  --   --  14 13  ALKPHOS 98  --   --  95 108  BILITOT 2.2*   < > 2.2* 2.1* 2.4*  PROT 6.1*  --   --  5.7* 5.6*  ALBUMIN 2.9*  --   --  2.5* 2.3*   < > = values in this interval not displayed.   No results for input(s): LIPASE, AMYLASE in the last 168 hours. CBC: Recent Labs  Lab 02/09/20 0122 02/10/20 0452 02/11/20 0420  WBC 5.6 6.9 9.2  NEUTROABS 4.4 5.5  --   HGB 9.5* 8.1* 8.0*  HCT 31.1* 26.1* 26.6*  MCV 102.6* 101.2* 104.3*  PLT 89* 74* 83*   Blood Culture    Component Value Date/Time   SDES BLOOD LEFT HAND 02/09/2020 0315   SPECREQUEST  02/09/2020 0315    BOTTLES DRAWN AEROBIC ONLY Blood Culture results may not be optimal due to an  inadequate volume of blood received in culture bottles   CULT  02/09/2020 0315    NO GROWTH 2 DAYS Performed at [facility] [address], [place], [place] [address]    REPTSTATUS PENDING 02/09/2020 0315    Cardiac Enzymes: No results for input(s): CKTOTAL, CKMB, CKMBINDEX, TROPONINI in the last 168 hours. CBG: Recent Labs  Lab 02/10/20 0627 02/10/20 1123 02/10/20 1605 02/10/20 2129 02/11/20 0620  GLUCAP 118* 151* 169* 124* 111*   Iron Studies:  Recent Labs    02/10/20 0452  IRON 14*  TIBC 169*  FERRITIN 1,383*   @lablastinr3 @ Studies/Results: VAS US DUPLEX DIALYSIS ACCESS (AVF, AVG)  Result Date: 02/10/2020 DIALYSIS ACCESS Reason for Exam: Mechanical complication AVF. Access Type: Brachial-cephalic AVF. Comparison Study: No prior study Performing Technologist: Maudry Mayhew MHA, RDMS, RVT, RDCS  Examination Guidelines: A complete evaluation includes B-mode imaging, spectral Doppler, color Doppler, and power Doppler as needed of all accessible portions of each vessel. Unilateral testing is considered an integral part of a complete examination. Limited  examinations for reoccurring indications may be performed as noted.  Findings: +--------------------+----------+-----------------+--------+ AVF                 PSV (cm/s)Flow Vol (mL/min)Comments +--------------------+----------+-----------------+--------+ Native artery inflow   260           917                +--------------------+----------+-----------------+--------+ AVF Anastomosis        257                              +--------------------+----------+-----------------+--------+  +------------+----------+-------------+----------+-----------------------------+ OUTFLOW VEINPSV (cm/s)Diameter (cm)Depth (cm)          Describe            +------------+----------+-------------+----------+-----------------------------+ Prox UA        255        0.71        0.35         competing branch         +------------+----------+-------------+----------+-----------------------------+ Mid UA         170        0.68        0.70         competing branch        +------------+----------+-------------+----------+-----------------------------+ Dist UA        504        0.47        0.27   partially-occlusive thrombus                                                   and competing branch      +------------+----------+-------------+----------+-----------------------------+   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 *See table(s) above for measurements and observations.  Diagnosing physician: Ruta Hinds MD Electronically signed by Ruta Hinds MD on 02/10/2020 at 4:03:31 PM.   --------------------------------------------------------------------------------   Final    Medications: . sodium chloride Stopped (02/10/20 1409)   . atorvastatin  80 mg Oral q1800  . buPROPion  300 mg Oral Daily  . carvedilol  25 mg Oral Q12H  . clopidogrel  75 mg Oral Daily  . colchicine  0.3 mg Oral Daily  . febuxostat  40 mg Oral Daily  . furosemide  80 mg Oral BID  . insulin aspart  0-6 Units Subcutaneous TID WC  . omega-3 acid ethyl esters  1 g Oral BID  . sodium chloride flush  3 mL Intravenous Q12H  . sodium chloride flush  3 mL Intravenous Q12H  . vancomycin variable dose per unstable renal function (pharmacist dosing)   Does not apply See admin instructions    Assessment/Plan **cellulitis:  RLE; antibiotics per primary. Wound consulted - has unna boot now.   **CKD 5:  No uremic symptoms and no current indications for dialysis; I think her confusion is delerium not uremia. Noted is worsening BUN/Cr 92/4.57 which I think is related to acute illness with cellulitis + modest hypovolemia with poor po intake in past day though no dysgeusia reported.  Will hold lasix today and follow.  Her AVF does not feel useable to me; doppler US showing decent size in mid and upper arm but  small in distal arm with competing branch.   Should she need HD this admission would have VVS eval inpatient o/w would refer  outpt.   **HFpEF:  Had edema on CXR on admission; looks ok on RA today.  Not great po intake - with cr up will hold home lasix 80 BID for now.  Low na diet, fluid restrict.  **Anemia: Hb 8.0 from 9.5.  Iron deficient but in setting of severe infection hold on IV iron. Folate low, replete per primary.  Hasn't required ESA to date, will start aranesp 60 weekly.   **Thrombocytopenia:  No bleeding noted, follow.  Not on heparin. Per primary.  **DM: per primary  **h/o CAD, h/o CVA  Discussed with daughter [name] [phone] via phone.  She was appreciative. She does like daily updates if she's not in the room.  Jannifer Hick MD 02/11/2020, 12:16 PM  [facility] Pager: [phone]

## 2020-02-11 NOTE — Progress Notes (Signed)
Another daughter Rogelio Seen called and asked for update. She called and said that she would like update from MD, MD paged. Also she stated that her mom needs oxygen and she is wheezing. Assessed lungs, oxygen good and no weezing. Informed daughter.

## 2020-02-11 NOTE — NC FL2 (Signed)
[place] MEDICAID FL2 LEVEL OF CARE SCREENING TOOL     IDENTIFICATION  Patient Name: [name] Birthdate: 1951-05-11 Sex: female Admission Date (Current Location): 02/09/2020  [facility] and [place] Number:  Herbalist and Address:  [facility]. [facility], [place] [address] [place], [place] [address]      Provider Number: [id number]  Attending Physician Name and Address:  Mercy Riding, MD  Relative Name and Phone Number:  Rogelio Seen [phone]    Current Level of Care: SNF Recommended Level of Care: [place] Prior Approval Number:    Date Approved/Denied: 03/21/13 PASRR Number: [id number]  Discharge Plan: SNF    Current Diagnoses: Patient Active Problem List   Diagnosis Date Noted  . Cellulitis of right leg 02/09/2020  . Thrombocytopenia ([place]) 02/09/2020  . Hyperbilirubinemia 02/09/2020  . AKI (acute kidney injury) ([place]) 08/10/2019  . Hypoglycemia 08/09/2019  . Hyperkalemia 08/09/2019  . Lactic acidosis 08/09/2019  . Hypothermia 08/09/2019  . CVA (cerebral vascular accident) ([place]) 12/19/2016  . S/P BKA (below knee amputation) unilateral ([place]) 08/06/2014  . Chronic respiratory failure with hypoxia ([place]) 08/06/2014  . Essential hypertension 06/23/2014  . Acute on chronic diastolic CHF (congestive heart failure) ([place]) 01/12/2014  . UTI (lower urinary tract infection) 01/11/2014  . Anemia in chronic kidney disease 09/25/2013  . Hyponatremia 08/06/2013  . CKD (chronic kidney disease), stage IV ([place]) 08/06/2013  . Severe obesity (BMI >= 40) ([place]) 06/22/2013  . Peripheral arterial disease: s/p L BKA; Occluded R Pop A, 2 V runnoff 05/01/2013  . Hyperlipidemia LDL goal <70   . Hypertension associated with diabetes ([place])   . Unspecified constipation 04/16/2013  . Type 2 diabetes mellitus with renal manifestations ([place]) 04/16/2013  . Secondary renovascular hypertension, benign 04/09/2013  . Nausea and vomiting 03/18/2013  .  Acute renal failure superimposed on stage 4 chronic kidney disease ([place]) 03/18/2013  . Anemia of chronic disease 03/18/2013  . CVA (cerebral infarction) 03/18/2013  . Non-ketotic hyperosmolar coma ([place]) 03/18/2013  . CAD S/P PCI TO RCA for Inferior STEMI. BMS 03/31/2009  . Status post myocardial infarction of inferior wall 03/31/2009    Orientation RESPIRATION BLADDER Height & Weight     Situation, Place, Self  Normal Incontinent, External catheter Weight: 280 lb (127 kg) Height:  5\' 2"  (157.5 cm)  BEHAVIORAL SYMPTOMS/MOOD NEUROLOGICAL BOWEL NUTRITION STATUS      Incontinent Diet(See Discharge Summary)  AMBULATORY STATUS COMMUNICATION OF NEEDS Skin   Extensive Assist   Skin abrasions, Other (Comment)(Abrasion on right and left arm, Amputation Left Lower Leg, Cellulitis Right and Left Leg, Ecchymosis Chest, Breast, Shoulder Bilateral, Skin Tear Left Lower Groin)                       Personal Care Assistance Level of Assistance  Bathing, Feeding, Dressing Bathing Assistance: Maximum assistance Feeding assistance: Maximum assistance(not able to feed self) Dressing Assistance: Maximum assistance     Functional Limitations Info  Sight, Hearing, Speech Sight Info: Impaired Hearing Info: Adequate Speech Info: Adequate    SPECIAL CARE FACTORS FREQUENCY  PT (By licensed PT), OT (By licensed OT)     PT Frequency: 5x min weekly OT Frequency: 5x min weekly            Contractures Contractures Info: Not present    Additional Factors Info  Code Status Code Status Info: FULL             Current Medications (02/11/2020):  This is the current hospital active medication list Current Facility-Administered Medications  Medication Dose Route Frequency Provider Last Rate Last Admin  . 0.9 %  sodium chloride infusion  250 mL Intravenous PRN Opyd, Ilene Qua, MD   Stopped at 02/10/20 1409  . acetaminophen (TYLENOL) tablet 650 mg  650 mg Oral Q6H PRN Opyd, Ilene Qua, MD   650 mg  at 02/10/20 1728   Or  . acetaminophen (TYLENOL) suppository 650 mg  650 mg Rectal Q6H PRN Opyd, Ilene Qua, MD      . allopurinol (ZYLOPRIM) tablet 50 mg  50 mg Oral Daily Gonfa, Taye T, MD      . atorvastatin (LIPITOR) tablet 80 mg  80 mg Oral q1800 Opyd, Ilene Qua, MD   80 mg at 02/10/20 1729  . buPROPion (WELLBUTRIN XL) 24 hr tablet 300 mg  300 mg Oral Daily Opyd, Ilene Qua, MD   300 mg at 02/11/20 0835  . carvedilol (COREG) tablet 25 mg  25 mg Oral Q12H Opyd, Ilene Qua, MD   25 mg at 02/11/20 0835  . clopidogrel (PLAVIX) tablet 75 mg  75 mg Oral Daily Opyd, Ilene Qua, MD   75 mg at 02/11/20 0836  . colchicine tablet 0.3 mg  0.3 mg Oral Daily Eulogio Bear U, DO   0.3 mg at 02/11/20 0835  . Darbepoetin Alfa (ARANESP) injection 60 mcg  60 mcg Subcutaneous Q Thu-1800 Justin Mend, MD      . doxycycline (VIBRA-TABS) tablet 100 mg  100 mg Oral Q12H Gonfa, Taye T, MD      . folic acid (FOLVITE) tablet 1 mg  1 mg Oral Daily Gonfa, Taye T, MD      . furosemide (LASIX) tablet 80 mg  80 mg Oral BID Vianne Bulls, MD   80 mg at 02/11/20 0835  . insulin aspart (novoLOG) injection 0-6 Units  0-6 Units Subcutaneous TID WC Opyd, Ilene Qua, MD   1 Units at 02/10/20 1719  . omega-3 acid ethyl esters (LOVAZA) capsule 1 g  1 g Oral BID Opyd, Ilene Qua, MD   1 g at 02/11/20 0835  . ondansetron (ZOFRAN) tablet 4 mg  4 mg Oral Q6H PRN Opyd, Ilene Qua, MD       Or  . ondansetron (ZOFRAN) injection 4 mg  4 mg Intravenous Q6H PRN Opyd, Ilene Qua, MD      . sodium chloride flush (NS) 0.9 % injection 3 mL  3 mL Intravenous Q12H Opyd, Ilene Qua, MD   3 mL at 02/11/20 0836  . sodium chloride flush (NS) 0.9 % injection 3 mL  3 mL Intravenous Q12H Opyd, Ilene Qua, MD   3 mL at 02/11/20 0836  . sodium chloride flush (NS) 0.9 % injection 3 mL  3 mL Intravenous PRN Opyd, Ilene Qua, MD         Discharge Medications: Please see discharge summary for a list of discharge medications.  Relevant Imaging  Results:  Relevant Lab Results:   Additional Information SSN: [id number]  Trula Ore, LCSWA

## 2020-02-11 NOTE — Progress Notes (Addendum)
   02/11/20 0211  External Urinary Catheter  Placement Date/Time: 02/09/20 2052   External Urinary Catheter Type: Female  Output (mL) 250 mL  Urine Characteristics  Urinary Incontinence Yes  Urine Color Yellow/straw  Urine Appearance Cloudy  Urinary Interventions Bladder scan  Bladder Scan Volume (mL) 269 mL  Intermittent/Straight Cath (mL) 250 mL  Hygiene Peri care   Correction:pt has no foley as previously pulled out 2days ago. Pt has not urinated since start of the shift. Bladder scanned and did In and out cath as protocol @ 2AM. Drained 250 ml. Will monitor

## 2020-02-12 ENCOUNTER — Inpatient Hospital Stay (HOSPITAL_COMMUNITY): Payer: Medicare Other

## 2020-02-12 DIAGNOSIS — Z7189 Other specified counseling: Secondary | ICD-10-CM

## 2020-02-12 DIAGNOSIS — E7220 Disorder of urea cycle metabolism, unspecified: Secondary | ICD-10-CM

## 2020-02-12 DIAGNOSIS — G9341 Metabolic encephalopathy: Secondary | ICD-10-CM

## 2020-02-12 HISTORY — PX: IR US GUIDE VASC ACCESS RIGHT: IMG2390

## 2020-02-12 HISTORY — PX: IR FLUORO GUIDE CV LINE RIGHT: IMG2283

## 2020-02-12 LAB — AMMONIA: Ammonia: 45 umol/L — ABNORMAL HIGH (ref 9–35)

## 2020-02-12 LAB — CBC
HCT: 26.2 % — ABNORMAL LOW (ref 36.0–46.0)
Hemoglobin: 7.9 g/dL — ABNORMAL LOW (ref 12.0–15.0)
MCH: 31.2 pg (ref 26.0–34.0)
MCHC: 30.2 g/dL (ref 30.0–36.0)
MCV: 103.6 fL — ABNORMAL HIGH (ref 80.0–100.0)
Platelets: 82 10*3/uL — ABNORMAL LOW (ref 150–400)
RBC: 2.53 MIL/uL — ABNORMAL LOW (ref 3.87–5.11)
RDW: 15.6 % — ABNORMAL HIGH (ref 11.5–15.5)
WBC: 8.6 10*3/uL (ref 4.0–10.5)
nRBC: 0 % (ref 0.0–0.2)

## 2020-02-12 LAB — RENAL FUNCTION PANEL
Albumin: 2.2 g/dL — ABNORMAL LOW (ref 3.5–5.0)
Anion gap: 14 (ref 5–15)
BUN: 98 mg/dL — ABNORMAL HIGH (ref 8–23)
CO2: 21 mmol/L — ABNORMAL LOW (ref 22–32)
Calcium: 8.6 mg/dL — ABNORMAL LOW (ref 8.9–10.3)
Chloride: 106 mmol/L (ref 98–111)
Creatinine, Ser: 4.82 mg/dL — ABNORMAL HIGH (ref 0.44–1.00)
GFR calc Af Amer: 10 mL/min — ABNORMAL LOW (ref >60)
GFR calc non Af Amer: 9 mL/min — ABNORMAL LOW (ref >60)
Glucose, Bld: 105 mg/dL — ABNORMAL HIGH (ref 70–99)
Phosphorus: 3.8 mg/dL (ref 2.5–4.6)
Potassium: 4.2 mmol/L (ref 3.5–5.1)
Sodium: 141 mmol/L (ref 135–145)

## 2020-02-12 LAB — GLUCOSE, CAPILLARY
Glucose-Capillary: 96 mg/dL (ref 70–99)
Glucose-Capillary: 99 mg/dL (ref 70–99)
Glucose-Capillary: 109 mg/dL — ABNORMAL HIGH (ref 70–99)
Glucose-Capillary: 148 mg/dL — ABNORMAL HIGH (ref 70–99)

## 2020-02-12 LAB — RPR: RPR Ser Ql: NONREACTIVE

## 2020-02-12 LAB — MAGNESIUM: Magnesium: 1.9 mg/dL (ref 1.7–2.4)

## 2020-02-12 MED ORDER — BUPROPION HCL 100 MG PO TABS
100.0000 mg | ORAL_TABLET | Freq: Three times a day (TID) | ORAL | Status: DC
  Administered 2020-02-13 – 2020-02-26 (×31): 100 mg via ORAL
  Filled 2020-02-12 (×42): qty 1

## 2020-02-12 MED ORDER — LACTULOSE 10 GM/15ML PO SOLN
20.0000 g | Freq: Two times a day (BID) | ORAL | Status: DC
  Administered 2020-02-12 – 2020-02-25 (×15): 20 g via ORAL
  Filled 2020-02-12 (×24): qty 30

## 2020-02-12 MED ORDER — HEPARIN SODIUM (PORCINE) 1000 UNIT/ML IJ SOLN
INTRAMUSCULAR | Status: DC | PRN
  Administered 2020-02-12: 2600 [IU] via INTRAVENOUS
  Administered 2020-02-15: 1600 [IU] via INTRAVENOUS
  Administered 2020-02-21: 1000 [IU] via INTRAVENOUS
  Administered 2020-02-26: 4000 [IU] via INTRAVENOUS

## 2020-02-12 MED ORDER — DEXTROSE-NACL 5-0.9 % IV SOLN: INTRAVENOUS | Status: DC

## 2020-02-12 MED ORDER — CHLORHEXIDINE GLUCONATE CLOTH 2 % EX PADS
6.0000 | MEDICATED_PAD | Freq: Every day | CUTANEOUS | Status: DC
  Administered 2020-02-13 – 2020-02-17 (×4): 6 via TOPICAL

## 2020-02-12 MED ORDER — SODIUM CHLORIDE 0.9 % IV SOLN
510.0000 mg | Freq: Once | INTRAVENOUS | Status: DC
  Filled 2020-02-12: qty 17

## 2020-02-12 MED ORDER — HEPARIN SODIUM (PORCINE) 1000 UNIT/ML IJ SOLN
INTRAMUSCULAR | Status: AC
  Filled 2020-02-12: qty 1

## 2020-02-12 MED ORDER — LIDOCAINE HCL 1 % IJ SOLN
INTRAMUSCULAR | Status: AC
  Filled 2020-02-12: qty 20

## 2020-02-12 NOTE — Progress Notes (Signed)
PROGRESS NOTE  [name] ZOX:[id number] DOB: 1951/08/30   PCP: Katherina Mires, MD  Patient is from: Home.  Lives with her daughters.  Uses wheelchair at baseline.  Also bedbound for most part  DOA: 02/09/2020 LOS: 2  Brief Narrative / Interim history: 69 year old female with history of DM-2, CAD s/p BMS to RCA, CVA, thrombocytopenia, diastolic CHF, CKD-5 with LUE aVF placed in 07/2019, PAD s/p left BKA presenting with progressive redness and edema of right lower extremity, and admitted for right lower extremity cellulitis, fluid overload and acute on chronic diastolic CHF.  Patient was started on IV vancomycin for cellulitis.    Nephrology consulted for AKI.  Continued on p.o. Lasix 80 mg twice daily.  Renal function continued to get worse.  Lasix discontinued.  Started on gentle IV fluid.  Temporary dialysis catheter placed on 3/26.  Plan for hemodialysis if no improvement with IV fluid.  In regards to cellulitis, she remained afebrile.  No leukocytosis.  RLE wrapped in [employer].  Transition to p.o. doxycycline.   Subjective: Seen and examined earlier this morning.  No major events overnight of this morning.  She is somewhat lethargic.  She responds no to pain, shortness of breath, GI or UTI symptoms.  Not a great historian.  She is oriented to self, place and month but slow to respond.  Objective: Vitals:   02/11/20 1648 02/12/20 0518 02/12/20 0523 02/12/20 0916  BP: (!) 131/55  (!) 132/51 (!) 158/57  Pulse: (!) 58  (!) 54 (!) 58  Resp: 15  20   Temp: 97.6 F (36.4 C)  97.8 F (36.6 C)   TempSrc: Oral  Oral   SpO2: 93%  94% 98%  Weight:  126.6 kg    Height:        Intake/Output Summary (Last 24 hours) at 02/12/2020 1156 Last data filed at 02/11/2020 2303 Gross per 24 hour  Intake 120 ml  Output 400 ml  Net -280 ml   Filed Weights   02/10/20 0350 02/11/20 0300 02/12/20 0518  Weight: 127 kg 127 kg 126.6 kg    Examination:  GENERAL: Feels lethargic.  Slow to  respond. HEENT: MMM.  Vision and hearing grossly intact.  PERRL.  EOMI.  No facial asymmetry. NECK: Supple.  No apparent JVD.  RESP: On RA.  Some IWOB.  Fair aeration bilaterally. CVS:  RRR. Heart sounds normal.  ABD/GI/GU: Bowel sounds present. Soft. Non tender.  MSK/EXT: Unna boot over RLE.  LLE BKA.  RUE weakness. SKIN: Unna boot over RLE. NEURO: Awake but not alert.  Oriented to self, place and months.  Follows commands.  PERRL.  EOMI.  No facial droop.  Motor 3/5 in RUE and RLE.  4/5 in LUE.  Light sensation grossly intact. PSYCH: Appears weak and tired.  Procedures:  None  Assessment & Plan: Acute on chronic diastolic CHF complicated by advanced renal failure: Echo in 2018 with EF of 65 to 70%, G1 DD, and moderate LAE.  Presented with SOB, DOE and fluid overload.  CXR with cardiomegaly and interstitial edema.  Only 400 cc UOP/24 hours charted.  Renal function and azotemia worse. -Fluid management per nephrology-D5-NS at 75 cc an hour.  TDC and plan for HD if no improvement -Monitor fluid status, renal function and electrolytes.  AKI on CKD-5/azotemia: Baseline Cr 3.50 (on 3/17)> 3.98 (admit)> 4.22> 4.57>4.82.  BUN 68> 98.  She is on Lasix at home.  Received high-dose Lasix and vancomycin here.  Renal US without acute  finding to explain.  Has right aVF placed in 07/2019 but not able to use.   -Management as above  RLE cellulitis: RLE in Unna boot this morning.  Has been on vancomycin since admission.  No fever or leukocytosis.  DVT studies and blood cultures negative. -De-escalate antibiotic to doxycycline on 3/25. -Continue Unna boot -Appreciate wound care RN input  Acute metabolic encephalopathy-multifactorial including hyperammonia (45), azotemia (98) and possibly delirium.  No new focal neuro deficit.  She had RUE weakness (chronic per her daughter) from her old stroke.  CT head without acute finding.  RPR negative.  B12 high. -Treating azotemia and dehydration as  above -Lactulose 20 g twice daily for elevated ammonia -Check TSH-normal in 07/2019. -Frequent orientation and delirium precautions. -Avoid sedating medications.  Controlled DM-2 with CKD-5 and hyperglycemia: A1c 6.0%. Recent Labs    02/11/20 1651 02/12/20 0530 02/12/20 0816  GLUCAP 126* 96 99  -Continue current regimen  Normocytic anemia: Baseline Hgb 8-9>> 8.1 (admit)> 8.0> 7.9.  Anemia panel features both IDA and ACD.  Folic acid 5.7. -May benefit from transfusion given history of CAD -ESA and iron per nephrology -Folic acid  History of PAD s/p left BKA -Continue Lipitor, Plavix, Zetia  History of CVA with RUE weakness. -Lipitor, Plavix and Zetia as above  History of CAD s/p BMS to RCA in 2010: Stable -Continue home cardiac meds  Morbid obesity:Body mass index is 51.03 kg/m. -Lifestyle change to lose weight  Debility/ambulatory dysfunction inpatient with left BKA in 2015-uses wheelchair at baseline. -PT/OT-recommended SNF  Thrombocytopenia: Baseline 120s to 130s> 89 (admit)> 74> 83> 82 -Continue monitoring.  History of gout-on colchicine and Uloric.  Risk of adverse cardiac outcome with Uloric -Prefer low-dose allopurinol. -Continue renally dosed colchicine  Goal of care: Patient with comorbidities as above.  Limited mobility.  Uses wheelchair at baseline.  She is full code per her daughter.  Now with renal failure. -Palliative care consulted.             DVT prophylaxis: None in the setting of thrombocytopenia.  She has Haematologist over RLE.  Left BKA. Code Status: Full code-confirmed with daughter, [name]. Family Communication: Updated patient's daughter, [name] today.  Discharge barrier: Advanced renal failure/acute on chronic diastolic CHF and acute metabolic encephalopathy Patient is from: Home Final disposition: Likely SNF when medically stable and cleared by nephrology  Consultants: Nephrology   Microbiology summarized: COVID-19  negative   Sch Meds:  Scheduled Meds: . allopurinol  50 mg Oral Daily  . atorvastatin  80 mg Oral q1800  . buPROPion  300 mg Oral Daily  . carvedilol  25 mg Oral Q12H  . clopidogrel  75 mg Oral Daily  . darbepoetin (ARANESP) injection - NON-DIALYSIS  60 mcg Subcutaneous Q Thu-1800  . doxycycline  100 mg Oral Q12H  . folic acid  1 mg Oral Daily  . heparin      . insulin aspart  0-6 Units Subcutaneous TID WC  . lactulose  20 g Oral BID  . lidocaine      . omega-3 acid ethyl esters  1 g Oral BID  . sodium chloride flush  3 mL Intravenous Q12H  . sodium chloride flush  3 mL Intravenous Q12H   Continuous Infusions: . sodium chloride Stopped (02/10/20 1409)  . dextrose 5 % and 0.9% NaCl    . [START ON 02/13/2020] ferumoxytol     PRN Meds:.sodium chloride, acetaminophen **OR** acetaminophen, heparin, ondansetron **OR** ondansetron (ZOFRAN) IV, sodium chloride flush  Antimicrobials: Anti-infectives (From admission, onward)   Start     Dose/Rate Route Frequency Ordered Stop   02/11/20 1415  doxycycline (VIBRA-TABS) tablet 100 mg     100 mg Oral Every 12 hours 02/11/20 1319     02/11/20 1015  vancomycin (VANCOREADY) IVPB 750 mg/150 mL     750 mg 150 mL/hr over 60 Minutes Intravenous NOW 02/11/20 0916 02/11/20 1148   02/09/20 0315  vancomycin (VANCOREADY) IVPB 2000 mg/400 mL     2,000 mg 200 mL/hr over 120 Minutes Intravenous  Once 02/09/20 0300 02/09/20 0638   02/09/20 0302  vancomycin variable dose per unstable renal function (pharmacist dosing)  Status:  Discontinued      Does not apply See admin instructions 02/09/20 0302 02/11/20 1319   02/09/20 0245  vancomycin (VANCOCIN) IVPB 1000 mg/200 mL premix  Status:  Discontinued     1,000 mg 200 mL/hr over 60 Minutes Intravenous  Once 02/09/20 0243 02/09/20 0300       I have personally reviewed the following labs and images: CBC: Recent Labs  Lab 02/09/20 0122 02/10/20 0452 02/11/20 0420 02/12/20 0439  WBC 5.6 6.9 9.2 8.6   NEUTROABS 4.4 5.5  --   --   HGB 9.5* 8.1* 8.0* 7.9*  HCT 31.1* 26.1* 26.6* 26.2*  MCV 102.6* 101.2* 104.3* 103.6*  PLT 89* 74* 83* 82*   BMP &GFR Recent Labs  Lab 02/09/20 0122 02/10/20 0452 02/11/20 0420 02/12/20 0439  NA 141 139 139 141  K 4.1 3.9 3.9 4.2  CL 105 105 106 106  CO2 21* 21* 20* 21*  GLUCOSE 159* 125* 108* 105*  BUN 68* 78* 92* 98*  CREATININE 3.98* 4.22* 4.57* 4.82*  CALCIUM 9.1 8.7* 8.4* 8.6*  MG  --   --   --  1.9  PHOS  --   --   --  3.8   Estimated Creatinine Clearance: 14.2 mL/min (A) (by C-G formula based on SCr of 4.82 mg/dL (H)). Liver & Pancreas: Recent Labs  Lab 02/09/20 0122 02/09/20 0316 02/10/20 0452 02/11/20 0420 02/12/20 0439  AST 13*  --  11* 10*  --   ALT 14  --  14 13  --   ALKPHOS 98  --  95 108  --   BILITOT 2.2* 2.2* 2.1* 2.4*  --   PROT 6.1*  --  5.7* 5.6*  --   ALBUMIN 2.9*  --  2.5* 2.3* 2.2*   No results for input(s): LIPASE, AMYLASE in the last 168 hours. Recent Labs  Lab 02/12/20 0439  AMMONIA 45*   Diabetic: No results for input(s): HGBA1C in the last 72 hours. Recent Labs  Lab 02/11/20 0620 02/11/20 1151 02/11/20 1651 02/12/20 0530 02/12/20 0816  GLUCAP 111* 146* 126* 96 99   Cardiac Enzymes: No results for input(s): CKTOTAL, CKMB, CKMBINDEX, TROPONINI in the last 168 hours. No results for input(s): PROBNP in the last 8760 hours. Coagulation Profile: No results for input(s): INR, PROTIME in the last 168 hours. Thyroid Function Tests: No results for input(s): TSH, T4TOTAL, FREET4, T3FREE, THYROIDAB in the last 72 hours. Lipid Profile: No results for input(s): CHOL, HDL, LDLCALC, TRIG, CHOLHDL, LDLDIRECT in the last 72 hours. Anemia Panel: Recent Labs    02/10/20 0452  VITAMINB12 2,254*  FOLATE 5.7*  FERRITIN 1,383*  TIBC 169*  IRON 14*  RETICCTPCT 1.4   Urine analysis:    Component Value Date/Time   COLORURINE YELLOW 08/09/2019 1518   APPEARANCEUR HAZY (A) 08/09/2019 1518  LABSPEC 1.011  08/09/2019 1518   PHURINE 8.0 08/09/2019 1518   GLUCOSEU NEGATIVE 08/09/2019 1518   HGBUR NEGATIVE 08/09/2019 1518   BILIRUBINUR NEGATIVE 08/09/2019 1518   KETONESUR NEGATIVE 08/09/2019 1518   PROTEINUR 30 (A) 08/09/2019 1518   UROBILINOGEN 0.2 08/24/2015 1002   NITRITE NEGATIVE 08/09/2019 1518   LEUKOCYTESUR LARGE (A) 08/09/2019 1518   Sepsis Labs: Invalid input(s): PROCALCITONIN, [place]  Microbiology: Recent Results (from the past 240 hour(s))  Blood culture (routine x 2)     Status: None (Preliminary result)   Collection Time: 02/09/20  1:25 AM   Specimen: BLOOD LEFT ARM  Result Value Ref Range Status   Specimen Description BLOOD LEFT ARM  Final   Special Requests   Final    BOTTLES DRAWN AEROBIC AND ANAEROBIC Blood Culture adequate volume   Culture   Final    NO GROWTH 3 DAYS Performed at [facility], [address] [place], [place] [address]    Report Status PENDING  Incomplete  Respiratory Panel by RT PCR (Flu A&B, Covid) - Nasopharyngeal Swab     Status: None   Collection Time: 02/09/20  3:08 AM   Specimen: Nasopharyngeal Swab  Result Value Ref Range Status   SARS Coronavirus 2 by RT PCR NEGATIVE NEGATIVE Final    Comment: (NOTE) SARS-CoV-2 target nucleic acids are NOT DETECTED. The SARS-CoV-2 RNA is generally detectable in upper respiratoy specimens during the acute phase of infection. The lowest concentration of SARS-CoV-2 viral copies this assay can detect is 131 copies/mL. A negative result does not preclude SARS-Cov-2 infection and should not be used as the sole basis for treatment or other patient management decisions. A negative result may occur with  improper specimen collection/handling, submission of specimen other than nasopharyngeal swab, presence of viral mutation(s) within the areas targeted by this assay, and inadequate number of viral copies (<131 copies/mL). A negative result must be combined with clinical observations, patient  history, and epidemiological information. The expected result is Negative. Fact Sheet for Patients:  PinkCheek.be Fact Sheet for Healthcare Providers:  GravelBags.it This test is not yet ap proved or cleared by the [place] FDA and  has been authorized for detection and/or diagnosis of SARS-CoV-2 by FDA under an Emergency Use Authorization (EUA). This EUA will remain  in effect (meaning this test can be used) for the duration of the COVID-19 declaration under Section 564(b)(1) of the Act, 21 U.S.C. section 360bbb-3(b)(1), unless the authorization is terminated or revoked sooner.    Influenza A by PCR NEGATIVE NEGATIVE Final   Influenza B by PCR NEGATIVE NEGATIVE Final    Comment: (NOTE) The Xpert Xpress SARS-CoV-2/FLU/RSV assay is intended as an aid in  the diagnosis of influenza from Nasopharyngeal swab specimens and  should not be used as a sole basis for treatment. Nasal washings and  aspirates are unacceptable for Xpert Xpress SARS-CoV-2/FLU/RSV  testing. Fact Sheet for Patients: PinkCheek.be Fact Sheet for Healthcare Providers: GravelBags.it This test is not yet approved or cleared by the [place] FDA and  has been authorized for detection and/or diagnosis of SARS-CoV-2 by  FDA under an Emergency Use Authorization (EUA). This EUA will remain  in effect (meaning this test can be used) for the duration of the  Covid-19 declaration under Section 564(b)(1) of the Act, 21  U.S.C. section 360bbb-3(b)(1), unless the authorization is  terminated or revoked. Performed at [facility], [place] [address], [place], [place] [address]   Blood culture (routine x  2)     Status: None (Preliminary result)   Collection Time: 02/09/20  3:15 AM   Specimen: BLOOD LEFT HAND  Result Value Ref Range Status   Specimen Description BLOOD LEFT HAND  Final   Special Requests    Final    BOTTLES DRAWN AEROBIC ONLY Blood Culture results may not be optimal due to an inadequate volume of blood received in culture bottles   Culture   Final    NO GROWTH 3 DAYS Performed at [facility], [place] [address] [place], [place] [address]    Report Status PENDING  Incomplete    Radiology Studies: No results found.    Anahid Eskelson T. Corning  If 7PM-7AM, please contact night-coverage [email or web address] Password [facility] 02/12/2020, 11:56 AM

## 2020-02-12 NOTE — Plan of Care (Signed)
  Problem: Education: Goal: Ability to demonstrate management of disease process will improve Outcome: Progressing   Problem: Activity: Goal: Capacity to carry out activities will improve Outcome: Progressing   Problem: Cardiac: Goal: Ability to achieve and maintain adequate cardiopulmonary perfusion will improve Outcome: Progressing   Problem: Health Behavior/Discharge Planning: Goal: Ability to manage health-related needs will improve Outcome: Progressing   Problem: Clinical Measurements: Goal: Cardiovascular complication will be avoided Outcome: Progressing

## 2020-02-12 NOTE — Progress Notes (Signed)
Physical Therapy Treatment Patient Details Name: [name] MRN: [id number] DOB: February 27, 1951 Today's Date: 02/12/2020    History of Present Illness Pt is a 69 y/o female admitted 02/09/20 secondary to RLE cellulitis and volume overload. PMH includes CAD s/p stent, DM, CKD, CVA, dCHF, L BKA.   PT Comments    Pt with continued fatigue/lethargy, keeping eyes closed majority of session, although at times with eyes open/alert and answering questions. Pt required totalA to attempt sitting EOB, pt resisting movement with c/o RLE pain and unable to achieve full sitting posture. TotalA for repositioning to encourage chair position in bed in order to attempt eating breakfast/take medicines with RN. Would benefit from SNF-level therapies to maximize functional mobility and independence pending pt's ability to participate.    Follow Up Recommendations  SNF;Supervision/Assistance - 24 hour     Equipment Recommendations  Hospital bed;Other (comment)(hoyer lift, amputee lift pad)    Recommendations for Other Services  Palliative medicine     Precautions / Restrictions Precautions Precautions: Fall;Other (comment) Precaution Comments: RLE unna boot (painful LE), L BKA at baseline Restrictions Weight Bearing Restrictions: No    Mobility  Bed Mobility Overal bed mobility: Needs Assistance Bed Mobility: Supine to Sit     Supine to sit: Total assist     General bed mobility comments: Attempted sitting EOB with totalA for trunk elevation and to scoot hips, achieved partial sitting via helicopter technique but pt moaning and actively resisting with c/o RLE pain; pt providing no active assistance. TotalA for return to supine and repositioning to sit upright in bed to attempt eating breakfast. L lateral lean requiring pillow support to encourage midline  Transfers                 General transfer comment: Unable. Will require maximove lift  Ambulation/Gait                  Stairs             Wheelchair Mobility    Modified Rankin (Stroke Patients Only)       Balance                                            Cognition Arousal/Alertness: Lethargic Behavior During Therapy: Flat affect Overall Cognitive Status: No family/caregiver present to determine baseline cognitive functioning                                 General Comments: At times eyes closed and not responding, other times eyes open/alert and answering questions (although minimally). Answering name and some yes/no questions; able to state daughter's name with cues. Minimal participation      Exercises      General Comments General comments (skin integrity, edema, etc.): MD present at end of session, discussed potential for palliative medicine      Pertinent Vitals/Pain Pain Assessment: Faces Faces Pain Scale: Hurts even more Pain Location: RLE with touch/mobility Pain Descriptors / Indicators: Guarding;Grimacing;Moaning Pain Intervention(s): Monitored during session;Limited activity within patient's tolerance;Repositioned    Home Living                      Prior Function            PT Goals (current goals can now be found in the care  plan section) Progress towards PT goals: Not progressing toward goals - comment(fatigue/lethargy limiting participation)    Frequency    Min 2X/week      PT Plan Frequency needs to be updated    Co-evaluation              AM-PAC PT "6 Clicks" Mobility   Outcome Measure  Help needed turning from your back to your side while in a flat bed without using bedrails?: Total Help needed moving from lying on your back to sitting on the side of a flat bed without using bedrails?: Total Help needed moving to and from a bed to a chair (including a wheelchair)?: Total Help needed standing up from a chair using your arms (e.g., wheelchair or bedside chair)?: Total Help needed to walk in  hospital room?: Total Help needed climbing 3-5 steps with a railing? : Total 6 Click Score: 6    End of Session   Activity Tolerance: Patient limited by lethargy Patient left: in bed;with call bell/phone within reach;with nursing/sitter in room Nurse Communication: Mobility status;Need for lift equipment PT Visit Diagnosis: Difficulty in walking, not elsewhere classified (R26.2);Muscle weakness (generalized) (M62.81)     Time: 5747-3403 PT Time Calculation (min) (ACUTE ONLY): 19 min  Charges:  $Therapeutic Activity: 8-22 mins                    Mabeline Caras, PT, DPT Acute Rehabilitation Services  Pager [phone] Office [place] 02/12/2020, 9:44 AM

## 2020-02-12 NOTE — Procedures (Signed)
  Procedure: R IJ 16cm [place] HD catheter placement   EBL:   minimal Complications:  none immediate  See full dictation in [employer].  Dillard Cannon MD Main # [phone] Pager  [phone]

## 2020-02-12 NOTE — Progress Notes (Addendum)
Patient ID: [name], female   DOB: 05/06/1951, 69 y.o.   MRN: [id number] [facility] Progress Note   Assessment/ Plan:   1. Acute kidney Injury on chronic kidney disease stage V: Limited oral intake overnight with consequently poor urine output and worsening renal function noted overnight.  Mental status remains far from her baseline with lethargy and limited participation in PT/interaction.  I will give her D5/normal saline for the next 24 hours and at the same time have a dialysis catheter placed in anticipation of starting dialysis if she does not improve with fluids.  Right upper arm AV fistula appears poorly matured with multiple collaterals and possible thrombus.  Discontinue colchicine. 2.  Right lower extremity cellulitis: Clinically improving on oral doxycycline.  With [employer]. 3.  History of congestive heart failure with diastolic dysfunction: Currently appears to be compensated with furosemide discontinued yesterday due to poor oral intake and possibility for volume contraction.  We will give intravenous fluids today to gently volume expand her and see if we can improve renal function and more importantly mental status/oral intake.  Discontinue fluids if she develops increased work of breathing. 4.  Anemia: Downtrending hemoglobin and hematocrit, with iron deficiency and on ESA. 5.  Secondary hyperparathyroidism: Calcium and phosphorus levels currently at goal, poor oral intake and not on binders.  1018AM: Updated daughter [name] [phone]  (who teleconferenced her sisters) via phone.  Subjective:   Limited oral intake overnight with refusal of medications unless directly supervised.  Consequently with poor urine output.   Objective:   BP (!) 132/51 (BP Location: Left Arm)   Pulse (!) 54   Temp 97.8 F (36.6 C) (Oral)   Resp 20   Ht 5\' 2"  (1.575 m)   Wt 126.6 kg   SpO2 94%   BMI 51.03 kg/m   Intake/Output Summary (Last 24 hours) at 02/12/2020  0858 Last data filed at 02/11/2020 2303 Gross per 24 hour  Intake 360 ml  Output 400 ml  Net -40 ml   Weight change: -0.454 kg  Physical Exam: Gen: Sitting up in bed, lethargic with limited verbal responses. CVS: Pulse regular bradycardia, S1 and S2 normal Resp: Poor inspiratory effort with decreased breath sounds over bases, no rales Abd: Soft, flat, nontender Ext: Right leg in Unna boot, trace edema left ankle.  Short pulsatile right upper arm AVF.  Imaging: VAS US DUPLEX DIALYSIS ACCESS (AVF, AVG)  Result Date: 02/10/2020 DIALYSIS ACCESS Reason for Exam: Mechanical complication AVF. Access Type: Brachial-cephalic AVF. Comparison Study: No prior study Performing Technologist: [name] MHA, RDMS, RVT, RDCS  Examination Guidelines: A complete evaluation includes B-mode imaging, spectral Doppler, color Doppler, and power Doppler as needed of all accessible portions of each vessel. Unilateral testing is considered an integral part of a complete examination. Limited examinations for reoccurring indications may be performed as noted.  Findings: +--------------------+----------+-----------------+--------+ AVF                 PSV (cm/s)Flow Vol (mL/min)Comments +--------------------+----------+-----------------+--------+ Native artery inflow   260           917                +--------------------+----------+-----------------+--------+ AVF Anastomosis        257                              +--------------------+----------+-----------------+--------+  +------------+----------+-------------+----------+-----------------------------+ OUTFLOW VEINPSV (cm/s)Diameter (cm)Depth (cm)  Describe            +------------+----------+-------------+----------+-----------------------------+ Prox UA        255        0.71        0.35         competing branch        +------------+----------+-------------+----------+-----------------------------+ Mid UA         170         0.68        0.70         competing branch        +------------+----------+-------------+----------+-----------------------------+ Dist UA        504        0.47        0.27   partially-occlusive thrombus                                                   and competing branch      +------------+----------+-------------+----------+-----------------------------+   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 *See table(s) above for measurements and observations.  Diagnosing physician: [name] Hinds MD Electronically signed by [name] Hinds MD on 02/10/2020 at 4:03:31 PM.   --------------------------------------------------------------------------------   Final     Labs: BMET Recent Labs  Lab 02/09/20 0122 02/10/20 0452 02/11/20 0420 02/12/20 0439  NA 141 139 139 141  K 4.1 3.9 3.9 4.2  CL 105 105 106 106  CO2 21* 21* 20* 21*  GLUCOSE 159* 125* 108* 105*  BUN 68* 78* 92* 98*  CREATININE 3.98* 4.22* 4.57* 4.82*  CALCIUM 9.1 8.7* 8.4* 8.6*  PHOS  --   --   --  3.8   CBC Recent Labs  Lab 02/09/20 0122 02/10/20 0452 02/11/20 0420 02/12/20 0439  WBC 5.6 6.9 9.2 8.6  NEUTROABS 4.4 5.5  --   --   HGB 9.5* 8.1* 8.0* 7.9*  HCT 31.1* 26.1* 26.6* 26.2*  MCV 102.6* 101.2* 104.3* 103.6*  PLT 89* 74* 83* 82*    Medications:    . allopurinol  50 mg Oral Daily  . atorvastatin  80 mg Oral q1800  . buPROPion  300 mg Oral Daily  . carvedilol  25 mg Oral Q12H  . clopidogrel  75 mg Oral Daily  . colchicine  0.3 mg Oral Daily  . darbepoetin (ARANESP) injection - NON-DIALYSIS  60 mcg Subcutaneous Q Thu-1800  . doxycycline  100 mg Oral Q12H  . folic acid  1 mg Oral Daily  . insulin aspart  0-6 Units Subcutaneous TID WC  . lactulose  20 g Oral BID  . omega-3 acid ethyl esters  1 g Oral BID  . sodium chloride flush  3 mL Intravenous Q12H  . sodium chloride flush  3 mL Intravenous Q12H   [name] Shiley, MD 02/12/2020, 8:58 AM

## 2020-02-12 NOTE — H&P (Signed)
   Patient Status: [facility] - In-pt  Assessment and Plan: Patient in need of venous access for initiation of dialysis.  [name] is a 69 year old female with past medical history of advanced renal disease.  She right upper arm fistula created 08/12/2019 which has not matured well, pulling clots with attempts at HD.  IR consulted for temporary dialysis catheter placement.   PA called and spoke with all 3 daughters via telephone. They are in agreement for temporary catheter placement today.   Risks and benefits discussed with the patient including, but not limited to bleeding, infection, vascular injury, pneumothorax which may require chest tube placement, air embolism or even death  All of the patient's questions were answered, patient is agreeable to proceed. Consent signed and in chart.  ______________________________________________________________________   History of Present Illness: [name] is a 69 y.o. female with past medical history of chronic renal disease admitted with hypervolemia not responding to diuretics. She is confused with poor oral intake.  Her SCr continues to worsen.  IR consulted for temporary HD catheter placement for initiation of dialysis. She has had R IJ temp cath placement x2 in the past (in 2014).  Allergies and medications reviewed.   Review of Systems: A 12 point ROS discussed and pertinent positives are indicated in the HPI above.  All other systems are negative.  Review of Systems  Unable to perform ROS: Mental status change    Vital Signs: BP (!) 158/57 (BP Location: Left Arm)   Pulse (!) 58   Temp 97.8 F (36.6 C) (Oral)   Resp 20   Ht 5\' 2"  (1.575 m)   Wt 279 lb (126.6 kg)   SpO2 98%   BMI 51.03 kg/m   Physical Exam Vitals and nursing note reviewed.  Constitutional:      General: She is not in acute distress.    Appearance: She is ill-appearing.  HENT:     Mouth/Throat:     Mouth: Mucous membranes are moist.     Pharynx:  Oropharynx is clear.  Neck:     Comments: Superficial veins visible on right chest Cardiovascular:     Rate and Rhythm: Normal rate.  Pulmonary:     Effort: Pulmonary effort is normal. No respiratory distress.  Musculoskeletal:     Cervical back: Normal range of motion and neck supple.  Skin:    General: Skin is warm and dry.  Neurological:     Mental Status: She is alert. She is disoriented.      Imaging reviewed.   Labs:  COAGS: Recent Labs    08/09/19 1356  INR 1.1  APTT 20*    BMP: Recent Labs    02/09/20 0122 02/10/20 0452 02/11/20 0420 02/12/20 0439  NA 141 139 139 141  K 4.1 3.9 3.9 4.2  CL 105 105 106 106  CO2 21* 21* 20* 21*  GLUCOSE 159* 125* 108* 105*  BUN 68* 78* 92* 98*  CALCIUM 9.1 8.7* 8.4* 8.6*  CREATININE 3.98* 4.22* 4.57* 4.82*  GFRNONAA 11* 10* 9* 9*  GFRAA 13* 12* 11* 10*       Electronically Signed: Docia Barrier, PA 02/12/2020, 10:17 AM   I spent a total of 15 minutes in face to face in clinical consultation, greater than 50% of which was counseling/coordinating care for venous access.

## 2020-02-13 LAB — RENAL FUNCTION PANEL
Albumin: 2.3 g/dL — ABNORMAL LOW (ref 3.5–5.0)
Anion gap: 14 (ref 5–15)
BUN: 101 mg/dL — ABNORMAL HIGH (ref 8–23)
CO2: 20 mmol/L — ABNORMAL LOW (ref 22–32)
Calcium: 8.7 mg/dL — ABNORMAL LOW (ref 8.9–10.3)
Chloride: 108 mmol/L (ref 98–111)
Creatinine, Ser: 5.06 mg/dL — ABNORMAL HIGH (ref 0.44–1.00)
GFR calc Af Amer: 9 mL/min — ABNORMAL LOW (ref >60)
GFR calc non Af Amer: 8 mL/min — ABNORMAL LOW (ref >60)
Glucose, Bld: 205 mg/dL — ABNORMAL HIGH (ref 70–99)
Phosphorus: 3.8 mg/dL (ref 2.5–4.6)
Potassium: 3.9 mmol/L (ref 3.5–5.1)
Sodium: 142 mmol/L (ref 135–145)

## 2020-02-13 LAB — CBC
HCT: 26.8 % — ABNORMAL LOW (ref 36.0–46.0)
Hemoglobin: 8.1 g/dL — ABNORMAL LOW (ref 12.0–15.0)
MCH: 31.5 pg (ref 26.0–34.0)
MCHC: 30.2 g/dL (ref 30.0–36.0)
MCV: 104.3 fL — ABNORMAL HIGH (ref 80.0–100.0)
Platelets: 86 10*3/uL — ABNORMAL LOW (ref 150–400)
RBC: 2.57 MIL/uL — ABNORMAL LOW (ref 3.87–5.11)
RDW: 15.8 % — ABNORMAL HIGH (ref 11.5–15.5)
WBC: 8.7 10*3/uL (ref 4.0–10.5)
nRBC: 0 % (ref 0.0–0.2)

## 2020-02-13 LAB — MAGNESIUM: Magnesium: 2 mg/dL (ref 1.7–2.4)

## 2020-02-13 LAB — GLUCOSE, CAPILLARY
Glucose-Capillary: 165 mg/dL — ABNORMAL HIGH (ref 70–99)
Glucose-Capillary: 193 mg/dL — ABNORMAL HIGH (ref 70–99)
Glucose-Capillary: 191 mg/dL — ABNORMAL HIGH (ref 70–99)
Glucose-Capillary: 156 mg/dL — ABNORMAL HIGH (ref 70–99)
Glucose-Capillary: 133 mg/dL — ABNORMAL HIGH (ref 70–99)

## 2020-02-13 LAB — TSH: TSH: 4.046 u[IU]/mL (ref 0.350–4.500)

## 2020-02-13 LAB — HEPATITIS B SURFACE ANTIBODY,QUALITATIVE: Hep B S Ab: NONREACTIVE

## 2020-02-13 LAB — AMMONIA: Ammonia: 42 umol/L — ABNORMAL HIGH (ref 9–35)

## 2020-02-13 MED ORDER — SODIUM CHLORIDE 0.9 % IV SOLN: 100.0000 mL | INTRAVENOUS | Status: DC | PRN

## 2020-02-13 MED ORDER — ALTEPLASE 2 MG IJ SOLR: 2.0000 mg | Freq: Once | INTRAMUSCULAR | Status: DC | PRN

## 2020-02-13 MED ORDER — PENTAFLUOROPROP-TETRAFLUOROETH EX AERO: 1.0000 "application " | INHALATION_SPRAY | CUTANEOUS | Status: DC | PRN

## 2020-02-13 MED ORDER — HEPARIN SODIUM (PORCINE) 1000 UNIT/ML DIALYSIS: 1000.0000 [IU] | INTRAMUSCULAR | Status: DC | PRN

## 2020-02-13 MED ORDER — LIDOCAINE-PRILOCAINE 2.5-2.5 % EX CREA: 1.0000 "application " | TOPICAL_CREAM | CUTANEOUS | Status: DC | PRN

## 2020-02-13 MED ORDER — LIDOCAINE HCL (PF) 1 % IJ SOLN: 5.0000 mL | INTRAMUSCULAR | Status: DC | PRN

## 2020-02-13 MED ORDER — HEPARIN SODIUM (PORCINE) 1000 UNIT/ML DIALYSIS: 40.0000 [IU]/kg | INTRAMUSCULAR | Status: DC | PRN

## 2020-02-13 NOTE — Progress Notes (Signed)
Bladder scan done only 60 ml in the bladder patient is okay awaiting to go to dialysis.

## 2020-02-13 NOTE — Progress Notes (Signed)
PROGRESS NOTE  [name] XLK:[id number] DOB: 04/08/1951   PCP: Katherina Mires, MD  Patient is from: Home.  Lives with her daughters.  Uses wheelchair at baseline.  Also bedbound for most part  DOA: 02/09/2020 LOS: 3  Brief Narrative / Interim history: 69 year old female with history of DM-2, CAD s/p BMS to RCA, CVA, thrombocytopenia, diastolic CHF, CKD-5 with LUE aVF placed in 07/2019, PAD s/p left BKA presenting with progressive redness and edema of right lower extremity, and admitted for right lower extremity cellulitis, fluid overload and acute on chronic diastolic CHF.  Patient was started on IV vancomycin for cellulitis.    Nephrology consulted for AKI.  Continued on p.o. Lasix 80 mg twice daily.  Renal function continued to get worse.  Lasix discontinued.  Started on gentle IV fluid.  Temporary dialysis catheter placed on 3/26.  Renal function worse despite IV fluid. Started hemodialysis on 3/27.  In regards to cellulitis, she remained afebrile.  No leukocytosis.  RLE wrapped in [employer].  Transition to p.o. doxycycline.   Subjective: Seen and examined earlier this morning.  No major events overnight or this morning.  She denies pain, dyspnea and GI symptoms.  Her speech is difficult to understand without her dentures.  Follows commands appropriately.  Objective: Vitals:   02/12/20 1206 02/12/20 2039 02/13/20 0255 02/13/20 0310  BP: (!) 134/40 (!) 143/56  134/61  Pulse: (!) 56 (!) 59  (!) 58  Resp: 17 18  (!) 21  Temp: 98 F (36.7 C) 97.7 F (36.5 C)  97.8 F (36.6 C)  TempSrc: Oral Oral  Oral  SpO2: 93% 93%  93%  Weight:   125.6 kg   Height:        Intake/Output Summary (Last 24 hours) at 02/13/2020 1316 Last data filed at 02/13/2020 0400 Gross per 24 hour  Intake 1145.34 ml  Output 539 ml  Net 606.34 ml   Filed Weights   02/11/20 0300 02/12/20 0518 02/13/20 0255  Weight: 127 kg 126.6 kg 125.6 kg    Examination:  GENERAL: No apparent distress.  But looks  tired and weak. HEENT: MMM.  PERRL.  EOMI.  No facial asymmetry. NECK: Supple.  No apparent JVD.  RESP: On RA.  No IWOB.  Fair air movement but limited exam. CVS:  RRR. Heart sounds normal.  ABD/GI/GU: Bowel sounds present. Soft. Non tender.  MSK/EXT: Unna boot over RLE.  No erythema proximally.  LLE BKA.  RUE weakness. SKIN: Unna boot over RLE.  No skin lesion elsewhere. NEURO: Awake but not quite alert.  Oriented to self and place.  Follows command.  No facial asymmetry.  Motor 3/5 in RUE and RLE.  4/5 in LUE. PSYCH: Appears weak and tired.  Procedures:  None  Assessment & Plan: Acute on chronic diastolic CHF complicated by advanced renal failure: Echo in 2018 with EF of 65 to 70%, G1 DD, and moderate LAE.  Presented with SOB, DOE and fluid overload.  CXR with cardiomegaly and interstitial edema.  Had about 540 cc UOP overnight.  Renal function and azotemia worse despite IV fluid. -Discontinue IV fluid -Plan to start HD by nephrology.  AKI on CKD-5/azotemia>> ESRD: Cr 3.50 (on 3/17)> 3.98 (admit)> 4.22>>> 5.06.  BUN 68> 98> 101.  No improvement with Lasix or IV fluid challenge.  Renal US without acute finding to explain.  Has right aVF placed in 07/2019 but not able to use.   -Management as above  RLE cellulitis: RLE in [employer]  this morning.  Has been on vancomycin since admission.  No fever or leukocytosis.  DVT studies and blood cultures negative. -De-escalated antibiotic to doxycycline on 3/25. -Continue Unna boot -Appreciate wound care RN input  Acute metabolic encephalopathy-multifactorial including hyperammonia (45), azotemia (98) and possibly delirium.  No new focal neuro deficit.  She had RUE weakness (chronic per her daughter) from her old stroke.  CT head without acute finding.  RPR negative.  B12 high.  TSH normal. -Treat renal failure and azotemia as above -Lactulose 20 g twice daily for elevated ammonia -Frequent orientation and delirium precautions. -Avoid sedating  medications.  Controlled DM-2 with CKD-5 and hyperglycemia: A1c 6.0%. Recent Labs    02/12/20 2058 02/13/20 0635 02/13/20 1125  GLUCAP 165* 193* 191*  -Continue current regimen  Normocytic anemia:  Anemia panel features both IDA and ACD. B/l Hgb 8-9>> 8.1 (admit)> 8.0> 7.9> 8.1.  Folic acid 5.7. -May benefit from transfusion given history of CAD -ESA and iron per nephrology -Folic acid  History of PAD s/p left BKA -Continue Lipitor, Plavix, Zetia  History of CVA with RUE weakness. -Lipitor, Plavix and Zetia as above  History of CAD s/p BMS to RCA in 2010: Stable -Continue home cardiac meds  Morbid obesity:Body mass index is 50.66 kg/m. -Lifestyle change to lose weight  Debility/ambulatory dysfunction inpatient with left BKA in 2015-uses wheelchair at baseline. -PT/OT-recommended SNF  Thrombocytopenia: Baseline 120s to 130s> 89 (admit)> 74> 83> 86 -Continue monitoring.  History of gout-on colchicine and Uloric.  Risk of adverse cardiac outcome with Uloric -Prefer low-dose allopurinol. -Continue renally dosed colchicine  Goal of care: Patient with comorbidities as above.  Limited mobility.  Uses wheelchair at baseline.  She is full code per her daughter.  Now with ESRD starting dialysis. -Palliative care consulted.             DVT prophylaxis: None in the setting of thrombocytopenia.  She has Haematologist over RLE.  Left BKA. Code Status: Full code-confirmed with daughter, [name]. Family Communication: Updated patient's daughter, [name] on 3/26.  Discharge barrier: Progressive renal failure now requiring dialysis, acute metabolic encephalopathy Patient is from: Home Final disposition: Likely SNF when medically stable and cleared by nephrology  Consultants: Nephrology   Microbiology summarized: COVID-19 negative   Sch Meds:  Scheduled Meds: . allopurinol  50 mg Oral Daily  . atorvastatin  80 mg Oral q1800  . buPROPion  100 mg Oral TID  .  carvedilol  25 mg Oral Q12H  . Chlorhexidine Gluconate Cloth  6 each Topical Daily  . clopidogrel  75 mg Oral Daily  . darbepoetin (ARANESP) injection - NON-DIALYSIS  60 mcg Subcutaneous Q Thu-1800  . doxycycline  100 mg Oral Q12H  . folic acid  1 mg Oral Daily  . insulin aspart  0-6 Units Subcutaneous TID WC  . lactulose  20 g Oral BID  . omega-3 acid ethyl esters  1 g Oral BID  . sodium chloride flush  3 mL Intravenous Q12H  . sodium chloride flush  3 mL Intravenous Q12H   Continuous Infusions: . sodium chloride Stopped (02/10/20 1409)  . ferumoxytol     PRN Meds:.sodium chloride, acetaminophen **OR** acetaminophen, heparin, ondansetron **OR** ondansetron (ZOFRAN) IV, sodium chloride flush  Antimicrobials: Anti-infectives (From admission, onward)   Start     Dose/Rate Route Frequency Ordered Stop   02/11/20 1415  doxycycline (VIBRA-TABS) tablet 100 mg     100 mg Oral Every 12 hours 02/11/20 1319  02/11/20 1015  vancomycin (VANCOREADY) IVPB 750 mg/150 mL     750 mg 150 mL/hr over 60 Minutes Intravenous NOW 02/11/20 0916 02/11/20 1148   02/09/20 0315  vancomycin (VANCOREADY) IVPB 2000 mg/400 mL     2,000 mg 200 mL/hr over 120 Minutes Intravenous  Once 02/09/20 0300 02/09/20 0638   02/09/20 0302  vancomycin variable dose per unstable renal function (pharmacist dosing)  Status:  Discontinued      Does not apply See admin instructions 02/09/20 0302 02/11/20 1319   02/09/20 0245  vancomycin (VANCOCIN) IVPB 1000 mg/200 mL premix  Status:  Discontinued     1,000 mg 200 mL/hr over 60 Minutes Intravenous  Once 02/09/20 0243 02/09/20 0300       I have personally reviewed the following labs and images: CBC: Recent Labs  Lab 02/09/20 0122 02/10/20 0452 02/11/20 0420 02/12/20 0439 02/13/20 0521  WBC 5.6 6.9 9.2 8.6 8.7  NEUTROABS 4.4 5.5  --   --   --   HGB 9.5* 8.1* 8.0* 7.9* 8.1*  HCT 31.1* 26.1* 26.6* 26.2* 26.8*  MCV 102.6* 101.2* 104.3* 103.6* 104.3*  PLT 89* 74* 83*  82* 86*   BMP &GFR Recent Labs  Lab 02/09/20 0122 02/10/20 0452 02/11/20 0420 02/12/20 0439 02/13/20 0521  NA 141 139 139 141 142  K 4.1 3.9 3.9 4.2 3.9  CL 105 105 106 106 108  CO2 21* 21* 20* 21* 20*  GLUCOSE 159* 125* 108* 105* 205*  BUN 68* 78* 92* 98* 101*  CREATININE 3.98* 4.22* 4.57* 4.82* 5.06*  CALCIUM 9.1 8.7* 8.4* 8.6* 8.7*  MG  --   --   --  1.9 2.0  PHOS  --   --   --  3.8 3.8   Estimated Creatinine Clearance: 13.5 mL/min (A) (by C-G formula based on SCr of 5.06 mg/dL (H)). Liver & Pancreas: Recent Labs  Lab 02/09/20 0122 02/09/20 0316 02/10/20 0452 02/11/20 0420 02/12/20 0439 02/13/20 0521  AST 13*  --  11* 10*  --   --   ALT 14  --  14 13  --   --   ALKPHOS 98  --  95 108  --   --   BILITOT 2.2* 2.2* 2.1* 2.4*  --   --   PROT 6.1*  --  5.7* 5.6*  --   --   ALBUMIN 2.9*  --  2.5* 2.3* 2.2* 2.3*   No results for input(s): LIPASE, AMYLASE in the last 168 hours. Recent Labs  Lab 02/12/20 0439 02/13/20 0521  AMMONIA 45* 42*   Diabetic: No results for input(s): HGBA1C in the last 72 hours. Recent Labs  Lab 02/12/20 1209 02/12/20 1604 02/12/20 2058 02/13/20 0635 02/13/20 1125  GLUCAP 109* 148* 165* 193* 191*   Cardiac Enzymes: No results for input(s): CKTOTAL, CKMB, CKMBINDEX, TROPONINI in the last 168 hours. No results for input(s): PROBNP in the last 8760 hours. Coagulation Profile: No results for input(s): INR, PROTIME in the last 168 hours. Thyroid Function Tests: Recent Labs    02/13/20 0521  TSH 4.046   Lipid Profile: No results for input(s): CHOL, HDL, LDLCALC, TRIG, CHOLHDL, LDLDIRECT in the last 72 hours. Anemia Panel: No results for input(s): VITAMINB12, FOLATE, FERRITIN, TIBC, IRON, RETICCTPCT in the last 72 hours. Urine analysis:    Component Value Date/Time   COLORURINE YELLOW 08/09/2019 1518   APPEARANCEUR HAZY (A) 08/09/2019 1518   LABSPEC 1.011 08/09/2019 1518   PHURINE 8.0 08/09/2019 1518   GLUCOSEU  NEGATIVE  08/09/2019 1518   HGBUR NEGATIVE 08/09/2019 1518   BILIRUBINUR NEGATIVE 08/09/2019 1518   KETONESUR NEGATIVE 08/09/2019 1518   PROTEINUR 30 (A) 08/09/2019 1518   UROBILINOGEN 0.2 08/24/2015 1002   NITRITE NEGATIVE 08/09/2019 1518   LEUKOCYTESUR LARGE (A) 08/09/2019 1518   Sepsis Labs: Invalid input(s): PROCALCITONIN, [place]  Microbiology: Recent Results (from the past 240 hour(s))  Blood culture (routine x 2)     Status: None (Preliminary result)   Collection Time: 02/09/20  1:25 AM   Specimen: BLOOD LEFT ARM  Result Value Ref Range Status   Specimen Description BLOOD LEFT ARM  Final   Special Requests   Final    BOTTLES DRAWN AEROBIC AND ANAEROBIC Blood Culture adequate volume   Culture   Final    NO GROWTH 3 DAYS Performed at [facility], [address], [place], [place] [address]    Report Status PENDING  Incomplete  Respiratory Panel by RT PCR (Flu A&B, Covid) - Nasopharyngeal Swab     Status: None   Collection Time: 02/09/20  3:08 AM   Specimen: Nasopharyngeal Swab  Result Value Ref Range Status   SARS Coronavirus 2 by RT PCR NEGATIVE NEGATIVE Final    Comment: (NOTE) SARS-CoV-2 target nucleic acids are NOT DETECTED. The SARS-CoV-2 RNA is generally detectable in upper respiratoy specimens during the acute phase of infection. The lowest concentration of SARS-CoV-2 viral copies this assay can detect is 131 copies/mL. A negative result does not preclude SARS-Cov-2 infection and should not be used as the sole basis for treatment or other patient management decisions. A negative result may occur with  improper specimen collection/handling, submission of specimen other than nasopharyngeal swab, presence of viral mutation(s) within the areas targeted by this assay, and inadequate number of viral copies (<131 copies/mL). A negative result must be combined with clinical observations, patient history, and epidemiological information. The expected result is  Negative. Fact Sheet for Patients:  PinkCheek.be Fact Sheet for Healthcare Providers:  GravelBags.it This test is not yet ap proved or cleared by the [place] FDA and  has been authorized for detection and/or diagnosis of SARS-CoV-2 by FDA under an Emergency Use Authorization (EUA). This EUA will remain  in effect (meaning this test can be used) for the duration of the COVID-19 declaration under Section 564(b)(1) of the Act, 21 U.S.C. section 360bbb-3(b)(1), unless the authorization is terminated or revoked sooner.    Influenza A by PCR NEGATIVE NEGATIVE Final   Influenza B by PCR NEGATIVE NEGATIVE Final    Comment: (NOTE) The Xpert Xpress SARS-CoV-2/FLU/RSV assay is intended as an aid in  the diagnosis of influenza from Nasopharyngeal swab specimens and  should not be used as a sole basis for treatment. Nasal washings and  aspirates are unacceptable for Xpert Xpress SARS-CoV-2/FLU/RSV  testing. Fact Sheet for Patients: PinkCheek.be Fact Sheet for Healthcare Providers: GravelBags.it This test is not yet approved or cleared by the [place] FDA and  has been authorized for detection and/or diagnosis of SARS-CoV-2 by  FDA under an Emergency Use Authorization (EUA). This EUA will remain  in effect (meaning this test can be used) for the duration of the  Covid-19 declaration under Section 564(b)(1) of the Act, 21  U.S.C. section 360bbb-3(b)(1), unless the authorization is  terminated or revoked. Performed at [facility], [place] [address] [place], [place] [address]   Blood culture (routine x 2)     Status: None (Preliminary result)   Collection Time:  02/09/20  3:15 AM   Specimen: BLOOD LEFT HAND  Result Value Ref Range Status   Specimen Description BLOOD LEFT HAND  Final   Special Requests   Final    BOTTLES DRAWN AEROBIC ONLY Blood Culture results may  not be optimal due to an inadequate volume of blood received in culture bottles   Culture   Final    NO GROWTH 3 DAYS Performed at [facility], [place] [address], [place], [place] [address]    Report Status PENDING  Incomplete    Radiology Studies: No results found.    Amie Cowens T. Onyx  If 7PM-7AM, please contact night-coverage [email or web address] Password [email or web address] 02/13/2020, 1:16 PM

## 2020-02-13 NOTE — Progress Notes (Signed)
Patient ID: [name], female   DOB: 1950-12-15, 69 y.o.   MRN: [id number] [facility] Progress Note   Assessment/ Plan:   1. Acute kidney Injury on chronic kidney disease stage V: She remains lethargic and nonoliguric overnight but with continued worsening of BUN/creatinine.  At this point, we will begin her on dialysis suspecting now that she has progressed on to end-stage renal disease.  I appreciate the assistance from radiology with placement of right IJ temporary dialysis catheter as her right upper arm AV fistula does not appear to be usable.  She will need conversion to [facility] and alternative access next week. 2.  Right lower extremity cellulitis: Clinically improving on oral doxycycline.  With [employer]. 3.  History of congestive heart failure with diastolic dysfunction: Currently appears to be compensated with furosemide discontinued yesterday due to poor oral intake and possibility for volume contraction.  Intravenous fluids discontinued due to lack of improvement and will start on dialysis with ultrafiltration today. 4.  Anemia: Downtrending hemoglobin and hematocrit, with iron deficiency and on ESA. 5.  Secondary hyperparathyroidism: Calcium and phosphorus levels currently at goal, poor oral intake and not on binders.  [id number]: Updated daughter [name] [phone]  (who teleconferenced her sisters) via phone.  Subjective:   Remains lethargic overnight with limited oral intake.   Objective:   BP 134/61 (BP Location: Left Arm)   Pulse (!) 58   Temp 97.8 F (36.6 C) (Oral)   Resp (!) 21   Ht 5\' 2"  (1.575 m)   Wt 125.6 kg   SpO2 93%   BMI 50.66 kg/m   Intake/Output Summary (Last 24 hours) at 02/13/2020 0902 Last data filed at 02/13/2020 0400 Gross per 24 hour  Intake 1145.34 ml  Output 539 ml  Net 606.34 ml   Weight change: -0.907 kg  Physical Exam: Gen: Sitting up in bed, lethargic with limited verbal responses. CVS: Pulse regular bradycardia, S1  and S2 normal.  Right IJ hemodialysis catheter Resp: Poor inspiratory effort with decreased breath sounds over bases, no rales Abd: Soft, flat, nontender Ext: Right leg in Unna boot, trace edema left ankle.  Short pulsatile right upper arm AVF.  Imaging: IR Fluoro Guide CV Line Right  Result Date: 02/12/2020 CLINICAL DATA:  Renal failure, needs venous access for hemodialysis EXAM: EXAM RIGHT IJ CATHETER PLACEMENT UNDER ULTRASOUND AND FLUOROSCOPIC GUIDANCE TECHNIQUE: The procedure, risks (including but not limited to bleeding, infection, organ damage, pneumothorax), benefits, and alternatives were explained to the patient. Questions regarding the procedure were encouraged and answered. The patient understands and consents to the procedure. Patency of the right IJ vein was confirmed with ultrasound with image documentation. An appropriate skin site was determined. Skin site was marked. Region was prepped using maximum barrier technique including cap and mask, sterile gown, sterile gloves, large sterile sheet, and Chlorhexidine as cutaneous antisepsis. The region was infiltrated locally with 1% lidocaine. Under real-time ultrasound guidance, the right IJ vein was accessed with a 19 gauge needle; the needle tip within the vein was confirmed with ultrasound image documentation. The needle exchanged over a guidewire for vascular dilator which allowed advancement of a 16 cm Trialysis catheter. This was positioned with the tip at the cavoatrial junction. Spot chest radiograph shows good positioning and no pneumothorax. Catheter was flushed and sutured externally with 0-Prolene sutures. Patient tolerated the procedure well. FLUOROSCOPY TIME:  Less than 0.1 minute; 6.81 uGym2 DAP COMPLICATIONS: COMPLICATIONS none IMPRESSION: 1. Technically successful right IJ Trialysis catheter placement. Electronically  Signed   By: Lucrezia Europe M.D.   On: 02/12/2020 16:11   IR US Guide Vasc Access Right  Result Date:  02/12/2020 CLINICAL DATA:  Renal failure, needs venous access for hemodialysis EXAM: EXAM RIGHT IJ CATHETER PLACEMENT UNDER ULTRASOUND AND FLUOROSCOPIC GUIDANCE TECHNIQUE: The procedure, risks (including but not limited to bleeding, infection, organ damage, pneumothorax), benefits, and alternatives were explained to the patient. Questions regarding the procedure were encouraged and answered. The patient understands and consents to the procedure. Patency of the right IJ vein was confirmed with ultrasound with image documentation. An appropriate skin site was determined. Skin site was marked. Region was prepped using maximum barrier technique including cap and mask, sterile gown, sterile gloves, large sterile sheet, and Chlorhexidine as cutaneous antisepsis. The region was infiltrated locally with 1% lidocaine. Under real-time ultrasound guidance, the right IJ vein was accessed with a 19 gauge needle; the needle tip within the vein was confirmed with ultrasound image documentation. The needle exchanged over a guidewire for vascular dilator which allowed advancement of a 16 cm Trialysis catheter. This was positioned with the tip at the cavoatrial junction. Spot chest radiograph shows good positioning and no pneumothorax. Catheter was flushed and sutured externally with 0-Prolene sutures. Patient tolerated the procedure well. FLUOROSCOPY TIME:  Less than 0.1 minute; 9.76 uGym2 DAP COMPLICATIONS: COMPLICATIONS none IMPRESSION: 1. Technically successful right IJ Trialysis catheter placement. Electronically Signed   By: Lucrezia Europe M.D.   On: 02/12/2020 16:11    Labs: BMET Recent Labs  Lab 02/09/20 0122 02/10/20 0452 02/11/20 0420 02/12/20 0439 02/13/20 0521  NA 141 139 139 141 142  K 4.1 3.9 3.9 4.2 3.9  CL 105 105 106 106 108  CO2 21* 21* 20* 21* 20*  GLUCOSE 159* 125* 108* 105* 205*  BUN 68* 78* 92* 98* 101*  CREATININE 3.98* 4.22* 4.57* 4.82* 5.06*  CALCIUM 9.1 8.7* 8.4* 8.6* 8.7*  PHOS  --   --   --   3.8 3.8   CBC Recent Labs  Lab 02/09/20 0122 02/09/20 0122 02/10/20 0452 02/11/20 0420 02/12/20 0439 02/13/20 0521  WBC 5.6   < > 6.9 9.2 8.6 8.7  NEUTROABS 4.4  --  5.5  --   --   --   HGB 9.5*   < > 8.1* 8.0* 7.9* 8.1*  HCT 31.1*   < > 26.1* 26.6* 26.2* 26.8*  MCV 102.6*   < > 101.2* 104.3* 103.6* 104.3*  PLT 89*   < > 74* 83* 82* 86*   < > = values in this interval not displayed.    Medications:    . allopurinol  50 mg Oral Daily  . atorvastatin  80 mg Oral q1800  . buPROPion  100 mg Oral TID  . carvedilol  25 mg Oral Q12H  . Chlorhexidine Gluconate Cloth  6 each Topical Daily  . clopidogrel  75 mg Oral Daily  . darbepoetin (ARANESP) injection - NON-DIALYSIS  60 mcg Subcutaneous Q Thu-1800  . doxycycline  100 mg Oral Q12H  . folic acid  1 mg Oral Daily  . insulin aspart  0-6 Units Subcutaneous TID WC  . lactulose  20 g Oral BID  . omega-3 acid ethyl esters  1 g Oral BID  . sodium chloride flush  3 mL Intravenous Q12H  . sodium chloride flush  3 mL Intravenous Q12H   Elmarie Shiley, MD 02/13/2020, 9:02 AM

## 2020-02-14 DIAGNOSIS — Z515 Encounter for palliative care: Secondary | ICD-10-CM

## 2020-02-14 DIAGNOSIS — Z789 Other specified health status: Secondary | ICD-10-CM

## 2020-02-14 DIAGNOSIS — Z7189 Other specified counseling: Secondary | ICD-10-CM

## 2020-02-14 DIAGNOSIS — L853 Xerosis cutis: Secondary | ICD-10-CM

## 2020-02-14 DIAGNOSIS — K117 Disturbances of salivary secretion: Secondary | ICD-10-CM

## 2020-02-14 DIAGNOSIS — R5381 Other malaise: Secondary | ICD-10-CM

## 2020-02-14 LAB — CBC
HCT: 26.7 % — ABNORMAL LOW (ref 36.0–46.0)
Hemoglobin: 8.4 g/dL — ABNORMAL LOW (ref 12.0–15.0)
MCH: 31.6 pg (ref 26.0–34.0)
MCHC: 31.5 g/dL (ref 30.0–36.0)
MCV: 100.4 fL — ABNORMAL HIGH (ref 80.0–100.0)
Platelets: 84 10*3/uL — ABNORMAL LOW (ref 150–400)
RBC: 2.66 MIL/uL — ABNORMAL LOW (ref 3.87–5.11)
RDW: 15.7 % — ABNORMAL HIGH (ref 11.5–15.5)
WBC: 9.6 10*3/uL (ref 4.0–10.5)
nRBC: 0.2 % (ref 0.0–0.2)

## 2020-02-14 LAB — CULTURE, BLOOD (ROUTINE X 2)
Culture: NO GROWTH
Culture: NO GROWTH
Special Requests: ADEQUATE

## 2020-02-14 LAB — RENAL FUNCTION PANEL
Albumin: 2.3 g/dL — ABNORMAL LOW (ref 3.5–5.0)
Anion gap: 13 (ref 5–15)
BUN: 57 mg/dL — ABNORMAL HIGH (ref 8–23)
CO2: 22 mmol/L (ref 22–32)
Calcium: 8.8 mg/dL — ABNORMAL LOW (ref 8.9–10.3)
Chloride: 108 mmol/L (ref 98–111)
Creatinine, Ser: 3.4 mg/dL — ABNORMAL HIGH (ref 0.44–1.00)
GFR calc Af Amer: 15 mL/min — ABNORMAL LOW (ref >60)
GFR calc non Af Amer: 13 mL/min — ABNORMAL LOW (ref >60)
Glucose, Bld: 109 mg/dL — ABNORMAL HIGH (ref 70–99)
Phosphorus: 2.5 mg/dL (ref 2.5–4.6)
Potassium: 3.6 mmol/L (ref 3.5–5.1)
Sodium: 143 mmol/L (ref 135–145)

## 2020-02-14 LAB — GLUCOSE, CAPILLARY
Glucose-Capillary: 107 mg/dL — ABNORMAL HIGH (ref 70–99)
Glucose-Capillary: 125 mg/dL — ABNORMAL HIGH (ref 70–99)
Glucose-Capillary: 123 mg/dL — ABNORMAL HIGH (ref 70–99)
Glucose-Capillary: 128 mg/dL — ABNORMAL HIGH (ref 70–99)

## 2020-02-14 LAB — MAGNESIUM: Magnesium: 1.7 mg/dL (ref 1.7–2.4)

## 2020-02-14 LAB — HEPATITIS B SURFACE ANTIGEN: Hepatitis B Surface Ag: NONREACTIVE

## 2020-02-14 LAB — HEPATITIS B CORE ANTIBODY, TOTAL: Hep B Core Total Ab: NONREACTIVE

## 2020-02-14 MED ORDER — HEPARIN SODIUM (PORCINE) 1000 UNIT/ML IJ SOLN
INTRAMUSCULAR | Status: AC
  Filled 2020-02-14: qty 3

## 2020-02-14 MED ORDER — BIOTENE DRY MOUTH MT LIQD
15.0000 mL | Freq: Two times a day (BID) | OROMUCOSAL | Status: DC
  Administered 2020-02-14: 15 mL via OROMUCOSAL
  Filled 2020-02-14 (×2): qty 15

## 2020-02-14 MED ORDER — RESOURCE THICKENUP CLEAR PO POWD
ORAL | Status: DC | PRN
  Filled 2020-02-14: qty 125

## 2020-02-14 NOTE — Progress Notes (Signed)
PROGRESS NOTE  [name] GUR:[id number] DOB: 1951/04/16   PCP: Katherina Mires, MD  Patient is from: Home.  Lives with her daughters.  Uses wheelchair at baseline.  Also bedbound for most part  DOA: 02/09/2020 LOS: 4  Brief Narrative / Interim history: 70 year old female with history of DM-2, CAD s/p BMS to RCA, CVA, thrombocytopenia, diastolic CHF, CKD-5 with LUE aVF placed in 07/2019, PAD s/p left BKA presenting with progressive redness and edema of right lower extremity, and admitted for right lower extremity cellulitis, fluid overload and acute on chronic diastolic CHF.  Patient was started on IV vancomycin for cellulitis.    Nephrology consulted for AKI.  Continued on p.o. Lasix 80 mg twice daily.  Renal function continued to get worse.  Lasix discontinued.  Started on gentle IV fluid.  Temporary dialysis catheter placed on 3/26.  Renal function worse despite IV fluid. Started hemodialysis on 3/27.  In regards to cellulitis, she remained afebrile.  No leukocytosis.  RLE wrapped in [employer].  Transition to p.o. doxycycline.   02/14/2020: Patient seen alongside patient's 2 daughters and patient's nurse.  Nephrology input is appreciated.  Patient has chronic kidney disease stage V that may have progressed to end-stage renal disease.  Repeat hemodialysis is planned for tomorrow.  For now, patient has right IJ temporary access.  As per nephrology team's note, [facility] is planned, and more permanent access planned later.  From what I understand, patient has right upper extremity AV fistula that is not usable.  Patient is known to the vascular surgery team.  Subjective: Patient seen.  Patient remains lethargic.  Patient is unable to give any significant history.  Objective: Vitals:   02/14/20 0409 02/14/20 0500 02/14/20 0808 02/14/20 1204  BP: (!) 157/54  (!) 174/57 (!) 129/53  Pulse: 64  64 67  Resp: 18 (!) 23 (!) 22 20  Temp: (!) 97.5 F (36.4 C)  97.9 F (36.6 C) 98.1 F (36.7 C)    TempSrc: Oral  Oral Oral  SpO2: 91%  93% 96%  Weight: 126.6 kg     Height:        Intake/Output Summary (Last 24 hours) at 02/14/2020 1301 Last data filed at 02/14/2020 0749 Gross per 24 hour  Intake 60 ml  Output 500 ml  Net -440 ml   Filed Weights   02/13/20 2215 02/14/20 0052 02/14/20 0409  Weight: 126 kg 125.5 kg 126.6 kg    Examination:  GENERAL: Lethargic.  Right IJ HEENT: Dry buccal mucosa.  Pallor noted. NECK: Supple.  No apparent JVD.  RESP: Clear to auscultation. CVS: S1-S2. ABD/GI/GU: Obese, soft and nontender.  Organs are not palpable. MSK/EXT: Unna boot over RLE.  Left BKA. NEURO: Lethargic.    Procedures:  None  Assessment & Plan: Acute on chronic diastolic CHF complicated by advanced renal failure: Echo in 2018 with EF of 65 to 70%, G1 DD, and moderate LAE.  Presented with SOB, DOE and fluid overload.  CXR with cardiomegaly and interstitial edema.  Had about 540 cc UOP overnight.  Renal function and azotemia worse despite IV fluid. -Discontinue IV fluid -Plan to start HD by nephrology. 02/14/2020: Suspect pulmonary edema/volume overload secondary to CKD 5 mm of progressed to end-stage renal disease.  Patient is already on hemodialysis.  Further.  Hemodialysis in the morning.  AKI on CKD-5/azotemia>> ESRD: Cr 3.50 (on 3/17)> 3.98 (admit)> 4.22>>> 5.06.  BUN 68> 98> 101.  No improvement with Lasix or IV fluid challenge.  Renal US  without acute finding to explain.  Has right aVF placed in 07/2019 but not able to use.   -Management as above 02/14/2020: AKI on CKD 5 versus progression to end-stage renal disease.  See above documentation.  Nephrology team is managing.  RLE cellulitis: RLE in Unna boot this morning.  Has been on vancomycin since admission.  No fever or leukocytosis.  DVT studies and blood cultures negative. -De-escalated antibiotic to doxycycline on 3/25. -Continue Unna boot -Appreciate wound care RN input 02/14/2020: Right Unna boot to right lower  extremity.  Skin right lower extremity not visualized.  Patient is on doxycycline.  Acute metabolic encephalopathy-multifactorial including hyperammonia (45), azotemia (98) and possibly delirium.  No new focal neuro deficit.  She had RUE weakness (chronic per her daughter) from her old stroke.  CT head without acute finding.  RPR negative.  B12 high.  TSH normal. -Treat renal failure and azotemia as above -Lactulose 20 g twice daily for elevated ammonia -Frequent orientation and delirium precautions. -Avoid sedating medications. 02/14/2020: Patient had hemodialysis yesterday.  Patient is new to me.  Suspect patient is improving as regards to lethargy.  Controlled DM-2 with CKD-5 and hyperglycemia: A1c 6.0%. Recent Labs    02/13/20 2125 02/14/20 0605 02/14/20 1118  GLUCAP 133* 107* 125*  -Continue current regimen 02/14/2020: Continue to optimize.  Normocytic anemia:  Anemia panel features both IDA and ACD. B/l Hgb 8-9>> 8.1 (admit)> 8.0> 7.9> 8.1.  Folic acid 5.7. -May benefit from transfusion given history of CAD -ESA and iron per nephrology -Folic acid 1/66/0630: Not at goal.  Nephrology team is managing.  History of PAD s/p left BKA -Continue Lipitor, Plavix, Zetia  History of CVA with RUE weakness. -Lipitor, Plavix and Zetia as above  History of CAD s/p BMS to RCA in 2010: Stable -Continue home cardiac meds  Morbid obesity:Body mass index is 51.03 kg/m. -Lifestyle change to lose weight  Debility/ambulatory dysfunction inpatient with left BKA in 2015-uses wheelchair at baseline. -PT/OT-recommended SNF  Thrombocytopenia: Baseline 120s to 130s> 89 (admit)> 74> 83> 86 -Continue monitoring.  Goal of care: Patient with comorbidities as above.  Limited mobility.  Uses wheelchair at baseline.  She is full code per her daughter.  Now with ESRD starting dialysis. -Palliative care consulted.   DVT prophylaxis: None in the setting of thrombocytopenia.  She has Haematologist over RLE.   Left BKA. Code Status: Full code-confirmed with daughter, [name]. Family Communication: Updated patient's daughter, [name] on 3/26.  Discharge barrier: Progressive renal failure now requiring dialysis, acute metabolic encephalopathy Patient is from: Home Final disposition: Likely SNF when medically stable and cleared by nephrology  Consultants: Nephrology   Microbiology summarized: COVID-19 negative   Sch Meds:  Scheduled Meds: . allopurinol  50 mg Oral Daily  . atorvastatin  80 mg Oral q1800  . buPROPion  100 mg Oral TID  . carvedilol  25 mg Oral Q12H  . Chlorhexidine Gluconate Cloth  6 each Topical Daily  . clopidogrel  75 mg Oral Daily  . darbepoetin (ARANESP) injection - NON-DIALYSIS  60 mcg Subcutaneous Q Thu-1800  . doxycycline  100 mg Oral Q12H  . folic acid  1 mg Oral Daily  . insulin aspart  0-6 Units Subcutaneous TID WC  . lactulose  20 g Oral BID  . omega-3 acid ethyl esters  1 g Oral BID  . sodium chloride flush  3 mL Intravenous Q12H  . sodium chloride flush  3 mL Intravenous Q12H   Continuous Infusions: . sodium  chloride Stopped (02/10/20 1409)  . ferumoxytol     PRN Meds:.sodium chloride, acetaminophen **OR** acetaminophen, heparin, ondansetron **OR** ondansetron (ZOFRAN) IV, sodium chloride flush  Antimicrobials: Anti-infectives (From admission, onward)   Start     Dose/Rate Route Frequency Ordered Stop   02/11/20 1415  doxycycline (VIBRA-TABS) tablet 100 mg     100 mg Oral Every 12 hours 02/11/20 1319     02/11/20 1015  vancomycin (VANCOREADY) IVPB 750 mg/150 mL     750 mg 150 mL/hr over 60 Minutes Intravenous NOW 02/11/20 0916 02/11/20 1148   02/09/20 0315  vancomycin (VANCOREADY) IVPB 2000 mg/400 mL     2,000 mg 200 mL/hr over 120 Minutes Intravenous  Once 02/09/20 0300 02/09/20 0638   02/09/20 0302  vancomycin variable dose per unstable renal function (pharmacist dosing)  Status:  Discontinued      Does not apply See admin instructions  02/09/20 0302 02/11/20 1319   02/09/20 0245  vancomycin (VANCOCIN) IVPB 1000 mg/200 mL premix  Status:  Discontinued     1,000 mg 200 mL/hr over 60 Minutes Intravenous  Once 02/09/20 0243 02/09/20 0300       I have personally reviewed the following labs and images: CBC: Recent Labs  Lab 02/09/20 0122 02/09/20 0122 02/10/20 0452 02/11/20 0420 02/12/20 0439 02/13/20 0521 02/14/20 0722  WBC 5.6   < > 6.9 9.2 8.6 8.7 9.6  NEUTROABS 4.4  --  5.5  --   --   --   --   HGB 9.5*   < > 8.1* 8.0* 7.9* 8.1* 8.4*  HCT 31.1*   < > 26.1* 26.6* 26.2* 26.8* 26.7*  MCV 102.6*   < > 101.2* 104.3* 103.6* 104.3* 100.4*  PLT 89*   < > 74* 83* 82* 86* 84*   < > = values in this interval not displayed.   BMP &GFR Recent Labs  Lab 02/10/20 0452 02/11/20 0420 02/12/20 0439 02/13/20 0521 02/14/20 0722  NA 139 139 141 142 143  K 3.9 3.9 4.2 3.9 3.6  CL 105 106 106 108 108  CO2 21* 20* 21* 20* 22  GLUCOSE 125* 108* 105* 205* 109*  BUN 78* 92* 98* 101* 57*  CREATININE 4.22* 4.57* 4.82* 5.06* 3.40*  CALCIUM 8.7* 8.4* 8.6* 8.7* 8.8*  MG  --   --  1.9 2.0 1.7  PHOS  --   --  3.8 3.8 2.5   Estimated Creatinine Clearance: 20.2 mL/min (A) (by C-G formula based on SCr of 3.4 mg/dL (H)). Liver & Pancreas: Recent Labs  Lab 02/09/20 0122 02/09/20 0122 02/09/20 0316 02/10/20 1779 02/11/20 0420 02/12/20 0439 02/13/20 0521 02/14/20 0722  AST 13*  --   --  11* 10*  --   --   --   ALT 14  --   --  14 13  --   --   --   ALKPHOS 98  --   --  95 108  --   --   --   BILITOT 2.2*  --  2.2* 2.1* 2.4*  --   --   --   PROT 6.1*  --   --  5.7* 5.6*  --   --   --   ALBUMIN 2.9*   < >  --  2.5* 2.3* 2.2* 2.3* 2.3*   < > = values in this interval not displayed.   No results for input(s): LIPASE, AMYLASE in the last 168 hours. Recent Labs  Lab 02/12/20 [phone] 02/13/20 0092  AMMONIA 45* 42*   Diabetic: No results for input(s): HGBA1C in the last 72 hours. Recent Labs  Lab 02/13/20 1125 02/13/20 1625  02/13/20 2125 02/14/20 0605 02/14/20 1118  GLUCAP 191* 156* 133* 107* 125*   Cardiac Enzymes: No results for input(s): CKTOTAL, CKMB, CKMBINDEX, TROPONINI in the last 168 hours. No results for input(s): PROBNP in the last 8760 hours. Coagulation Profile: No results for input(s): INR, PROTIME in the last 168 hours. Thyroid Function Tests: Recent Labs    02/13/20 0521  TSH 4.046   Lipid Profile: No results for input(s): CHOL, HDL, LDLCALC, TRIG, CHOLHDL, LDLDIRECT in the last 72 hours. Anemia Panel: No results for input(s): VITAMINB12, FOLATE, FERRITIN, TIBC, IRON, RETICCTPCT in the last 72 hours. Urine analysis:    Component Value Date/Time   COLORURINE YELLOW 08/09/2019 1518   APPEARANCEUR HAZY (A) 08/09/2019 1518   LABSPEC 1.011 08/09/2019 1518   PHURINE 8.0 08/09/2019 1518   GLUCOSEU NEGATIVE 08/09/2019 1518   HGBUR NEGATIVE 08/09/2019 1518   BILIRUBINUR NEGATIVE 08/09/2019 1518   KETONESUR NEGATIVE 08/09/2019 1518   PROTEINUR 30 (A) 08/09/2019 1518   UROBILINOGEN 0.2 08/24/2015 1002   NITRITE NEGATIVE 08/09/2019 1518   LEUKOCYTESUR LARGE (A) 08/09/2019 1518   Sepsis Labs: Invalid input(s): PROCALCITONIN, Arabi  Microbiology: Recent Results (from the past 240 hour(s))  Blood culture (routine x 2)     Status: None   Collection Time: 02/09/20  1:25 AM   Specimen: BLOOD LEFT ARM  Result Value Ref Range Status   Specimen Description BLOOD LEFT ARM  Final   Special Requests   Final    BOTTLES DRAWN AEROBIC AND ANAEROBIC Blood Culture adequate volume   Culture   Final    NO GROWTH 5 DAYS Performed at [facility], [address], [place], [place] [address]    Report Status 02/14/2020 FINAL  Final  Respiratory Panel by RT PCR (Flu A&B, Covid) - Nasopharyngeal Swab     Status: None   Collection Time: 02/09/20  3:08 AM   Specimen: Nasopharyngeal Swab  Result Value Ref Range Status   SARS Coronavirus 2 by RT PCR NEGATIVE NEGATIVE Final    Comment:  (NOTE) SARS-CoV-2 target nucleic acids are NOT DETECTED. The SARS-CoV-2 RNA is generally detectable in upper respiratoy specimens during the acute phase of infection. The lowest concentration of SARS-CoV-2 viral copies this assay can detect is 131 copies/mL. A negative result does not preclude SARS-Cov-2 infection and should not be used as the sole basis for treatment or other patient management decisions. A negative result may occur with  improper specimen collection/handling, submission of specimen other than nasopharyngeal swab, presence of viral mutation(s) within the areas targeted by this assay, and inadequate number of viral copies (<131 copies/mL). A negative result must be combined with clinical observations, patient history, and epidemiological information. The expected result is Negative. Fact Sheet for Patients:  PinkCheek.be Fact Sheet for Healthcare Providers:  GravelBags.it This test is not yet ap proved or cleared by the [place] FDA and  has been authorized for detection and/or diagnosis of SARS-CoV-2 by FDA under an Emergency Use Authorization (EUA). This EUA will remain  in effect (meaning this test can be used) for the duration of the COVID-19 declaration under Section 564(b)(1) of the Act, 21 U.S.C. section 360bbb-3(b)(1), unless the authorization is terminated or revoked sooner.    Influenza A by PCR NEGATIVE NEGATIVE Final   Influenza B by PCR NEGATIVE NEGATIVE Final    Comment: (NOTE)  The Xpert Xpress SARS-CoV-2/FLU/RSV assay is intended as an aid in  the diagnosis of influenza from Nasopharyngeal swab specimens and  should not be used as a sole basis for treatment. Nasal washings and  aspirates are unacceptable for Xpert Xpress SARS-CoV-2/FLU/RSV  testing. Fact Sheet for Patients: PinkCheek.be Fact Sheet for Healthcare  Providers: GravelBags.it This test is not yet approved or cleared by the [place] FDA and  has been authorized for detection and/or diagnosis of SARS-CoV-2 by  FDA under an Emergency Use Authorization (EUA). This EUA will remain  in effect (meaning this test can be used) for the duration of the  Covid-19 declaration under Section 564(b)(1) of the Act, 21  U.S.C. section 360bbb-3(b)(1), unless the authorization is  terminated or revoked. Performed at [facility], [place] [address], [place], [place] [address]   Blood culture (routine x 2)     Status: None   Collection Time: 02/09/20  3:15 AM   Specimen: BLOOD LEFT HAND  Result Value Ref Range Status   Specimen Description BLOOD LEFT HAND  Final   Special Requests   Final    BOTTLES DRAWN AEROBIC ONLY Blood Culture results may not be optimal due to an inadequate volume of blood received in culture bottles   Culture   Final    NO GROWTH 5 DAYS Performed at [facility], [place] [address] [place], [place] [address]    Report Status 02/14/2020 FINAL  Final    Radiology Studies: No results found.    Bonnell Public, M.D. [facility]  If 7PM-7AM, please contact night-coverage [email or web address] Password [email or web address] 02/14/2020, 1:01 PM

## 2020-02-14 NOTE — Progress Notes (Addendum)
Patients daughters are at bedside. They have spoken to this nurse, Dr Marthenia Rolling as well as Yvone Neu with palliative since arriving in the 11 oclock hour; which was preceded by a phone call from Dr Posey Pronto.   They are now requesting permission to set up a 2 way audio camera in the room so they may see the patient 24/7 and talk to her at any moment. Daughter, [name], was informed that the camera use goes against hospital policy.

## 2020-02-14 NOTE — Evaluation (Signed)
Clinical/Bedside Swallow Evaluation Patient Details  Name: [name] MRN: [id number] Date of Birth: Oct 05, 1951  Today's Date: 02/14/2020 Time: SLP Start Time (ACUTE ONLY): 1430 SLP Stop Time (ACUTE ONLY): 1526 SLP Time Calculation (min) (ACUTE ONLY): 56 min  Past Medical History:  Past Medical History:  Diagnosis Date  . Anemia   . Arthritis    "right shoulder" (12/19/2016)  . CAD S/P percutaneous coronary angioplasty 5/110    status post PCI to the RCA for inferior STEMI  . Cervical cancer ([place])   . Chronic diastolic heart failure, NYHA class 2 ([place])    Echocardiogram 06/2014:  Technically difficult study. Normal LV size with mild LVH. EF 55-60%. Moderate diastolic dysfunction, normal RV size and function. Likely aortic sclerosis without stenosis  . Chronic lower back pain   . CKD (chronic kidney disease), stage IV ([place])    Recent acute on chronic exacerbation in July 2014  . Depression   . Diabetes mellitus, type II, insulin dependent ([place])    With complications - CAD, CVA, peripheral ulcer ,  . Diabetic foot ulcer ([place])   . Diabetic peripheral neuropathy associated with type 2 diabetes mellitus ([place])   . 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 History of blood transfusion    "w/hysterectomy"   . Hyperlipidemia LDL goal <70   . Hypertension associated with diabetes ([place])   . Obesity, Class III, BMI 40-49.9 (morbid obesity) ([facility])    BMI 43; 5' 2',  235 pounds 6.4 ounces  . On home oxygen therapy    "2.5L prn; sometimes as little as once/month" (12/19/2016)  . PAD (peripheral artery disease) ([place])     -- Most recent Dopplers April 2016 Status post left BKA. Known right PTA occlusion  . Pneumonia    "childhood"  . ST elevation myocardial infarction (STEMI) of inferior wall ([place])  03/2009   Ostial/proximal RCA occlusion treated with BMS stent  . Stroke/cerebrovascular accident ([place]) 03/18/2013; 12/19/2016   a. Right-sided weakness, mostly  with balance issues and only mild weakness;; b. Slurring speach -- L Posterior Putamen CVA   Past Surgical History:  Past Surgical History:  Procedure Laterality Date  . ABDOMINAL HYSTERECTOMY    . AMPUTATION Left 08/07/2013   Procedure: left midfoot amputation;  Surgeon: Marianna Payment, MD;  Location: [facility];  Service: Orthopedics;  Laterality: Left;  . AMPUTATION Left 08/09/2013   Procedure: AMPUTATION BELOW KNEE;  Surgeon: Marianna Payment, MD;  Location: [facility];  Service: Orthopedics;  Laterality: Left;  . AV FISTULA PLACEMENT Left 12/03/2014   Procedure: ARTERIOVENOUS (AV) FISTULA CREATION;  Surgeon: Angelia Mould, MD;  Location: [facility];  Service: Vascular;  Laterality: Left;  . AV FISTULA PLACEMENT Right 08/12/2019   Procedure: ARTERIOVENOUS (AV) FISTULA CREATION;  Surgeon: Marty Heck, MD;  Location: [facility];  Service: Vascular;  Laterality: Right;  . CARDIAC CATHETERIZATION  03/31/2009   Proximal RCA thrombotic occlusion (inferior STEMI) other coronaries but in a codominant system  . EYE SURGERY     "bleeding; vessels leaking"  . [place]   "broke her leg in 2 places when she was young"  . IR FLUORO GUIDE CV LINE RIGHT  02/12/2020  . IR US GUIDE VASC ACCESS RIGHT  02/12/2020  . LEG SURGERY     hole in bone( during Childhood)  . LEG TENDON SURGERY Right 2000s   patient fell @ [facility]  .  PERCUTANEOUS CORONARY STENT INTERVENTION (PCI-S)  03/31/2009   PTCA , bare-metal stent 3.5 mm x 24 mm ostium of RCA ,EF 55%  . TRANSTHORACIC ECHOCARDIOGRAM  06/2014; 12/2016   a. Technically difficult study. Normal LV size with mild LVH. EF 55-60%. Mod - GR 2 DD. Nl RV size & fxn. ~ Aortic Sclerosis w/ stenosis;; b. LVEF 65-70%, GR 1-2 DD. No RWMA, Mod LA Dilation   HPI:  Pt is a 69 y/o female admitted 02/09/20 secondary to RLE cellulitis and volume overload. PMH includes CAD s/p stent, DM, CKD, CVA, dCHF, L BKA. Pt had a previous swallow evaluation in 2014 with functional  appearing swallow until challenged with large, consecutive boluses. Regular solids and thin liquids via small, single sips recommended at that time.   Assessment / Plan / Recommendation Clinical Impression  Pt presents with signs of dysphagia, including oral holding and delayed oral transit requiring cues from SLP and daughters to initiate posterior transit. She has multiple subswallows, facial grimacing, and intermittent expectoration of thin liquids that is more noticeable with larger sips but still present with smaller ones. Pt needs a lot of encouragement to attempt more thin liquid trials. She seems to require less effort in her intake of purees and nectar thick liquids, taking them with less encouragement, swallowing the entirety of her boluses, and only exhibiting up to two swallows per bite/sip. Discussed current presentation with daughters (two present, one via phone). It sounds like she has had an acute decline in her swallowing function and her ability to efficiently and likely safely consume POs. I think adjusting her diet to Dys 1 (puree) solids and nectar thick liquids would be a good starting point, but she will also need careful assistance during feeding. Diet consistencies and precautions were reviewed with nursing and family. If she continues to exhibit signs of a possible pharyngeal dysphagia then she may also benefit from [facility], but at this point she is quite lethargic and taking in minimal amounts of POs. Will f/u for additional dysphagia management. SLP Visit Diagnosis: Dysphagia, oropharyngeal phase (R13.12)    Aspiration Risk  Moderate aspiration risk;Risk for inadequate nutrition/hydration    Diet Recommendation Dysphagia 1 (Puree);Nectar-thick liquid   Liquid Administration via: Straw;Spoon Medication Administration: Crushed with puree Supervision: Staff to assist with self feeding;Full supervision/cueing for compensatory strategies Compensations: Slow rate;Small  sips/bites;Follow solids with liquid Postural Changes: Seated upright at 90 degrees;Remain upright for at least 30 minutes after po intake    Other  Recommendations Oral Care Recommendations: Oral care BID Other Recommendations: Have oral suction available   Follow up Recommendations Skilled Nursing facility      Frequency and Duration min 2x/week  2 weeks       Prognosis Prognosis for Safe Diet Advancement: Fair Barriers to Reach Goals: Cognitive deficits      Swallow Study   General HPI: Pt is a 69 y/o female admitted 02/09/20 secondary to RLE cellulitis and volume overload. PMH includes CAD s/p stent, DM, CKD, CVA, dCHF, L BKA. Pt had a previous swallow evaluation in 2014 with functional appearing swallow until challenged with large, consecutive boluses. Regular solids and thin liquids via small, single sips recommended at that time. Type of Study: Bedside Swallow Evaluation Previous Swallow Assessment: see HPI Diet Prior to this Study: Dysphagia 3 (soft);Thin liquids Temperature Spikes Noted: No Respiratory Status: Room air History of Recent Intubation: No Behavior/Cognition: Lethargic/Drowsy;Cooperative;Requires cueing Oral Cavity Assessment: Within Functional Limits Oral Care Completed by SLP: No Oral Cavity - Dentition:  Edentulous Self-Feeding Abilities: Total assist Patient Positioning: Upright in bed Baseline Vocal Quality: Normal Volitional Swallow: Unable to elicit    Oral/Motor/Sensory Function Overall Oral Motor/Sensory Function: (pt has difficulty completing oral motor exam)   Ice Chips Ice chips: Not tested   Thin Liquid Thin Liquid: Impaired Presentation: Straw Oral Phase Functional Implications: Oral holding Pharyngeal  Phase Impairments: Multiple swallows;Other (comments)(see clinical impressions)    Nectar Thick Nectar Thick Liquid: Impaired Presentation: Straw Oral phase functional implications: Oral holding Pharyngeal Phase Impairments: Multiple  swallows   Honey Thick Honey Thick Liquid: Not tested   Puree Puree: Impaired Presentation: Spoon Oral Phase Functional Implications: Oral holding;Prolonged oral transit Pharyngeal Phase Impairments: Multiple swallows   Solid     Solid: Not tested       Osie Bond., M.A. [place] Pager [phone] Office [phone]  02/14/2020,3:46 PM

## 2020-02-14 NOTE — Progress Notes (Signed)
Patient ID: [name], female   DOB: [birth year], 69 y.o.   MRN: [id number] [facility] Progress Note   Assessment/ Plan:   1. Acute kidney Injury on chronic kidney disease stage V: She remains lethargic and nonoliguric overnight even after hemodialysis yesterday (done for presumption that this was uremic encephalopathy).  Started on dialysis via temporary IJ dialysis catheter placed by IR after RUA AVF deemed unusable; will schedule her next dialysis again tomorrow.  She will need conversion to [facility] and placement of permanent access next week. 2.  Right lower extremity cellulitis: Clinically improving on oral doxycycline.  With [employer]. 3.  History of congestive heart failure with diastolic dysfunction: Currently appears to be compensated with dialysis started yesterday.  Leave off of diuretics and continue to monitor on dialysis. 4.  Anemia: Downtrending hemoglobin and hematocrit, with iron deficiency and on ESA. 5.  Secondary hyperparathyroidism: Calcium and phosphorus levels currently at goal, poor oral intake and not on binders.  [id number]: Updated daughter [name] [phone]  (who teleconferenced her sisters) via phone.  Subjective:   No reported problems to nurse earlier today, continues to have difficulty with oral intake.  Had her first dialysis treatment yesterday that she tolerated without problem.   Objective:   BP (!) 174/57 (BP Location: Left Arm)   Pulse 64   Temp 97.9 F (36.6 C) (Oral)   Resp (!) 22   Ht 5\' 2"  (1.575 m)   Wt 126.6 kg   SpO2 93%   BMI 51.03 kg/m   Intake/Output Summary (Last 24 hours) at 02/14/2020 4665 Last data filed at 02/14/2020 0749 Gross per 24 hour  Intake 60 ml  Output 500 ml  Net -440 ml   Weight change: 0.354 kg  Physical Exam: Gen: Sleeping soundly in bed-does not awaken to calling out her name but withdraws to pain CVS: Pulse regular rhythm, normal rate, S1 and S2 normal.  Right IJ hemodialysis catheter Resp:  Poor inspiratory effort with decreased breath sounds over bases, no rales Abd: Soft, flat, nontender Ext: Right leg in Ace wrap, trace edema left ankle.  Short pulsatile right upper arm AVF.  Imaging: IR Fluoro Guide CV Line Right  Result Date: 02/12/2020 CLINICAL DATA:  Renal failure, needs venous access for hemodialysis EXAM: EXAM RIGHT IJ CATHETER PLACEMENT UNDER ULTRASOUND AND FLUOROSCOPIC GUIDANCE TECHNIQUE: The procedure, risks (including but not limited to bleeding, infection, organ damage, pneumothorax), benefits, and alternatives were explained to the patient. Questions regarding the procedure were encouraged and answered. The patient understands and consents to the procedure. Patency of the right IJ vein was confirmed with ultrasound with image documentation. An appropriate skin site was determined. Skin site was marked. Region was prepped using maximum barrier technique including cap and mask, sterile gown, sterile gloves, large sterile sheet, and Chlorhexidine as cutaneous antisepsis. The region was infiltrated locally with 1% lidocaine. Under real-time ultrasound guidance, the right IJ vein was accessed with a 19 gauge needle; the needle tip within the vein was confirmed with ultrasound image documentation. The needle exchanged over a guidewire for vascular dilator which allowed advancement of a 16 cm Trialysis catheter. This was positioned with the tip at the cavoatrial junction. Spot chest radiograph shows good positioning and no pneumothorax. Catheter was flushed and sutured externally with 0-Prolene sutures. Patient tolerated the procedure well. FLUOROSCOPY TIME:  Less than 0.1 minute; 9.93 uGym2 DAP COMPLICATIONS: COMPLICATIONS none IMPRESSION: 1. Technically successful right IJ Trialysis catheter placement. Electronically Signed   By: Keturah Barre  Vernard Gambles M.D.   On: 02/12/2020 16:11   IR US Guide Vasc Access Right  Result Date: 02/12/2020 CLINICAL DATA:  Renal failure, needs venous access for  hemodialysis EXAM: EXAM RIGHT IJ CATHETER PLACEMENT UNDER ULTRASOUND AND FLUOROSCOPIC GUIDANCE TECHNIQUE: The procedure, risks (including but not limited to bleeding, infection, organ damage, pneumothorax), benefits, and alternatives were explained to the patient. Questions regarding the procedure were encouraged and answered. The patient understands and consents to the procedure. Patency of the right IJ vein was confirmed with ultrasound with image documentation. An appropriate skin site was determined. Skin site was marked. Region was prepped using maximum barrier technique including cap and mask, sterile gown, sterile gloves, large sterile sheet, and Chlorhexidine as cutaneous antisepsis. The region was infiltrated locally with 1% lidocaine. Under real-time ultrasound guidance, the right IJ vein was accessed with a 19 gauge needle; the needle tip within the vein was confirmed with ultrasound image documentation. The needle exchanged over a guidewire for vascular dilator which allowed advancement of a 16 cm Trialysis catheter. This was positioned with the tip at the cavoatrial junction. Spot chest radiograph shows good positioning and no pneumothorax. Catheter was flushed and sutured externally with 0-Prolene sutures. Patient tolerated the procedure well. FLUOROSCOPY TIME:  Less than 0.1 minute; 6.16 uGym2 DAP COMPLICATIONS: COMPLICATIONS none IMPRESSION: 1. Technically successful right IJ Trialysis catheter placement. Electronically Signed   By: Lucrezia Europe M.D.   On: 02/12/2020 16:11    Labs: BMET Recent Labs  Lab 02/09/20 0122 02/10/20 0452 02/11/20 0420 02/12/20 0439 02/13/20 0521 02/14/20 0722  NA 141 139 139 141 142 143  K 4.1 3.9 3.9 4.2 3.9 3.6  CL 105 105 106 106 108 108  CO2 21* 21* 20* 21* 20* 22  GLUCOSE 159* 125* 108* 105* 205* 109*  BUN 68* 78* 92* 98* 101* 57*  CREATININE 3.98* 4.22* 4.57* 4.82* 5.06* 3.40*  CALCIUM 9.1 8.7* 8.4* 8.6* 8.7* 8.8*  PHOS  --   --   --  3.8 3.8 2.5    CBC Recent Labs  Lab 02/09/20 0122 02/09/20 0122 02/10/20 0452 02/10/20 0452 02/11/20 0420 02/12/20 0439 02/13/20 0521 02/14/20 0722  WBC 5.6   < > 6.9   < > 9.2 8.6 8.7 9.6  NEUTROABS 4.4  --  5.5  --   --   --   --   --   HGB 9.5*   < > 8.1*   < > 8.0* 7.9* 8.1* 8.4*  HCT 31.1*   < > 26.1*   < > 26.6* 26.2* 26.8* 26.7*  MCV 102.6*   < > 101.2*   < > 104.3* 103.6* 104.3* 100.4*  PLT 89*   < > 74*   < > 83* 82* 86* 84*   < > = values in this interval not displayed.    Medications:    . allopurinol  50 mg Oral Daily  . atorvastatin  80 mg Oral q1800  . buPROPion  100 mg Oral TID  . carvedilol  25 mg Oral Q12H  . Chlorhexidine Gluconate Cloth  6 each Topical Daily  . clopidogrel  75 mg Oral Daily  . darbepoetin (ARANESP) injection - NON-DIALYSIS  60 mcg Subcutaneous Q Thu-1800  . doxycycline  100 mg Oral Q12H  . folic acid  1 mg Oral Daily  . heparin      . insulin aspart  0-6 Units Subcutaneous TID WC  . lactulose  20 g Oral BID  . omega-3 acid ethyl esters  1 g Oral BID  . sodium chloride flush  3 mL Intravenous Q12H  . sodium chloride flush  3 mL Intravenous Q12H   Elmarie Shiley, MD 02/14/2020, 9:05 AM

## 2020-02-14 NOTE — ACP (Advance Care Planning) (Addendum)
   Palliative Medicine Inpatient Follow Up Note   ADVANCED CARE NOTE  Met with [name] and her daughters, [name],  Ashritha Desrosiers (via phone), and Leonard Schwartz. Patients children all remain hopeful for improvement with dialysis.   In terms of family goals for the patient they would like to give her time to identify if she will stabilize. They would like to presently continue all medical interventions. They would like for her to remain full code/full scope of treatment. I shared with them that her likelihood for decline in the setting of dialysis initiation is significant (20%). Despite this knowledge the patients family would like a trial of all interventions.   Discussed the importance of continued conversation with family and their  medical providers regarding overall plan of care and treatment options, ensuring decisions are within the context of the patients values and GOCs.  Code Status: FULL CODE  Time Spent: 84  ______________________________________________________________________________________ [place] Palliative Medicine Team Team Cell Phone: [phone] Please utilize secure chat with additional questions, if there is no response within 30 minutes please call the above phone number  Palliative Medicine Team providers are available by phone from 7am to 7pm daily and can be reached through the team cell phone.  Should this patient require assistance outside of these hours, please call the patient's attending physician.

## 2020-02-14 NOTE — Consult Note (Signed)
Consultation Note Date: 02/14/2020   Patient Name: [name]  DOB: July 27, 1951  MRN: [id number]  Age / Sex: 69 y.o., female  PCP: Katherina Mires, MD Referring Physician: Bonnell Public, MD  Reason for Consultation: Establishing goals of care  HPI/Patient Profile: [name] is a 69 y.o. female with medical history significant for hypertension, diabetes, coronary artery disease, history of CVA, chronic diastolic CHF, and CKD 4, frequent falls, now presenting to the emergency department with progressive redness, swelling, and pain involving the right lower extremity. New initiation of dialysis.  Palliative Care was asked to get involved to help establish ongoing goals of care.  Clinical Assessment and Goals of Care: I have reviewed medical records including EPIC notes, labs and imaging, received report from bedside RN, assessed the patient.    I called Shavonne Pion to further discuss diagnosis prognosis, GOC, EOL wishes, disposition and options.   I introduced Palliative Medicine as specialized medical care for people living with serious illness. It focuses on providing relief from the symptoms and stress of a serious illness. The goal is to improve quality of life for both the patient and the family.  A brief life history review was had. [name] is from [place] originally. She moved to [place] in 2002. Her daughter [name] could not find a job there, they later moved to [place] as there were more job opportunities there. She is not presently working. She lives with her daughter, [name] who is also physically impaired. [name] is not married. She has four children" [name], [name], [place], and [name].   Preceding hospitalization patient was able to pivot from her bed to a chair though she is now completely dependent on assistance. He daughter shares that she is not able to easily care for  her at the present time and relies heavily on her family for support.  Patients children all remain hopeful for improvement with dialysis. Shavonne vocalized being amenable to outpatient Palliative care follow up.  In terms of family goals for the patient they would like to give her time to identify if she will stabilize. They would like to presently continue all medical interventions. They would like for her to remain full code/full scope of treatment. I shared with them that her likelihood for decline in the setting of dialysis initiation is significant (20%). Despite this knowledge the patients family would like a trial of all interventions.   Discussed the importance of continued conversation with family and their  medical providers regarding overall plan of care and treatment options, ensuring decisions are within the context of the patients values and GOCs.  Decision Maker: All three daughters help in terms of decision making though she lives with her daughter [name] [phone] therefore this would be the first contact  SUMMARY OF RECOMMENDATIONS   Full Code / Full Scope  TOC --> OP Palliative Care  TOC --> SW to help arrange transportation services (SCAT)   Financial Counselor --> Family interested in enrolling in medicaid  TOC --> Order DMEs  Chaplain Consult  Code Status/Advance Care Planning:  Full code  Symptom Management:  Physical Debility -   - Physical Therapy   - Occupational Therapy  Dry Skin:  - Emollient cream daily   Xerostomia:  - BID mouth care  - Biotene  Dysphagia:  - Swallow evaluation  - 1:1 feeding  ESRD:  - Newly initiated on HD  Social:  - Family will need to speak with social work as her primary caregiver has some physical debility as well. Support to see if patient can get medicaid and SCAT for transportation services. Family is interested in [place] application. They are also interested in arranging DME hospital bed, hoyer  life, and 3 in 1 commode.   Acute Medical Conditions: Acute on chronic diastolic CHF complicated by advanced renal failure AKI on CKD-5/azotemia>> ESRD RLE cellulitis Acute metabolic encephalopathy Controlled DM-2 with CKD-5 and hyperglycemia (A1c 6.0%)  Palliative Prophylaxis:   Aspiration, Bowel Regimen, Delirium Protocol, Eye Care, Frequent Pain Assessment, Oral Care, Palliative Wound Care and Turn Reposition  Additional Recommendations (Limitations, Scope, Preferences):  Full Scope Treatment  Psycho-social/Spiritual:   Desire for further Chaplaincy support: YES  Additional Recommendations: Caregiving  Support/Resources  Prognosis:   Unable to determine  Discharge Planning: Home with Home Health     Primary Diagnoses: Present on Admission: . Cellulitis of right leg . Acute renal failure superimposed on stage 4 chronic kidney disease ([place]) . Anemia in chronic kidney disease . Acute on chronic diastolic CHF (congestive heart failure) ([place]) . CVA (cerebral vascular accident) ([place]) . Type 2 diabetes mellitus with renal manifestations ([place]) . Thrombocytopenia ([place]) . Hyperbilirubinemia . AKI (acute kidney injury) ([place])  I have reviewed the medical record, interviewed the patient and family, and examined the patient. The following aspects are pertinent.  Past Medical History:  Diagnosis Date  . Anemia   . Arthritis    "right shoulder" (12/19/2016)  . CAD S/P percutaneous coronary angioplasty 5/110    status post PCI to the RCA for inferior STEMI  . Cervical cancer ([place])   . Chronic diastolic heart failure, NYHA class 2 ([place])    Echocardiogram 06/2014:  Technically difficult study. Normal LV size with mild LVH. EF 55-60%. Moderate diastolic dysfunction, normal RV size and function. Likely aortic sclerosis without stenosis  . Chronic lower back pain   . CKD (chronic kidney disease), stage IV ([place])    Recent acute on chronic exacerbation in July 2014  . Depression     . Diabetes mellitus, type II, insulin dependent ([place])    With complications - CAD, CVA, peripheral ulcer ,  . Diabetic foot ulcer ([place])   . Diabetic peripheral neuropathy associated with type 2 diabetes mellitus ([place])   . 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 History of blood transfusion    "w/hysterectomy"   . Hyperlipidemia LDL goal <70   . Hypertension associated with diabetes ([place])   . Obesity, Class III, BMI 40-49.9 (morbid obesity) ([facility])    BMI 43; 5' 2',  235 pounds 6.4 ounces  . On home oxygen therapy    "2.5L prn; sometimes as little as once/month" (12/19/2016)  . PAD (peripheral artery disease) ([place])     -- Most recent Dopplers April 2016 Status post left BKA. Known right PTA occlusion  . Pneumonia    "childhood"  . ST elevation myocardial infarction (STEMI) of inferior wall ([place])  03/2009   Ostial/proximal  RCA occlusion treated with BMS stent  . Stroke/cerebrovascular accident ([place]) 03/18/2013; 12/19/2016   a. Right-sided weakness, mostly with balance issues and only mild weakness;; b. Slurring speach -- L Posterior Putamen CVA   Social History   Socioeconomic History  . Marital status: Widowed    Spouse name: Not on file  . Number of children: 4  . Years of education: Not on file  . Highest education level: Not on file  Occupational History    Employer: UNEMPLOYED  Tobacco Use  . Smoking status: Former Smoker    Quit date: 04/29/1995    Years since quitting: 24.8  . Smokeless tobacco: Never Used  Substance and Sexual Activity  . Alcohol use: No  . Drug use: No  . Sexual activity: Never  Other Topics Concern  . Not on file  Social History Narrative   Was previously living in an assisted living center while recovering from her stroke. Lesion is now recently moved back home with her family.    She is attended by her daughter, who is super morbidly obese.   Social Determinants of Health   Financial Resource Strain:   .  Difficulty of Paying Living Expenses:   Food Insecurity:   . Worried About Charity fundraiser in the Last Year:   . Arboriculturist in the Last Year:   Transportation Needs:   . Film/video editor (Medical):   Marland Kitchen Lack of Transportation (Non-Medical):   Physical Activity:   . Days of Exercise per Week:   . Minutes of Exercise per Session:   Stress:   . Feeling of Stress :   Social Connections:   . Frequency of Communication with Friends and Family:   . Frequency of Social Gatherings with Friends and Family:   . Attends Religious Services:   . Active Member of Clubs or Organizations:   . Attends Archivist Meetings:   Marland Kitchen Marital Status:    Family History  Problem Relation Age of Onset  . Alcoholism Mother        Died from complications  . Alcoholism Father        Died from complications  . Cancer Father        Throat  . Pneumonia Father   . Diabetes Father   . Hypertension Father   . Heart disease Other        Unknown, unclear. Patient is not a good historian.  Essentially no pertinent history known   Scheduled Meds: . allopurinol  50 mg Oral Daily  . atorvastatin  80 mg Oral q1800  . buPROPion  100 mg Oral TID  . carvedilol  25 mg Oral Q12H  . Chlorhexidine Gluconate Cloth  6 each Topical Daily  . clopidogrel  75 mg Oral Daily  . darbepoetin (ARANESP) injection - NON-DIALYSIS  60 mcg Subcutaneous Q Thu-1800  . doxycycline  100 mg Oral Q12H  . folic acid  1 mg Oral Daily  . heparin      . insulin aspart  0-6 Units Subcutaneous TID WC  . lactulose  20 g Oral BID  . omega-3 acid ethyl esters  1 g Oral BID  . sodium chloride flush  3 mL Intravenous Q12H  . sodium chloride flush  3 mL Intravenous Q12H   Continuous Infusions: . sodium chloride Stopped (02/10/20 1409)  . ferumoxytol     PRN Meds:.sodium chloride, acetaminophen **OR** acetaminophen, heparin, ondansetron **OR** ondansetron (ZOFRAN) IV, sodium chloride flush Medications Prior to  Admission:    Prior to Admission medications   Medication Sig Start Date End Date Taking? Authorizing Provider  atorvastatin (LIPITOR) 80 MG tablet Take 1 tablet (80 mg total) by mouth daily at 6 PM. 12/20/16  Yes Svalina, Estill Dooms, MD  buPROPion (WELLBUTRIN XL) 300 MG 24 hr tablet Take 300 mg by mouth daily.   Yes [provider]  carvedilol (COREG) 25 MG tablet Take 25 mg by mouth every 12 (twelve) hours.  11/23/14  Yes [provider]  clopidogrel (PLAVIX) 75 MG tablet Take 1 tablet (75 mg total) by mouth daily. 12/20/16  Yes Alphonzo Grieve, MD  colchicine 0.6 MG tablet Take 0.6 mg by mouth daily.  03/19/18  Yes [provider]  febuxostat (ULORIC) 40 MG tablet Take 40 mg by mouth daily. 01/06/20  Yes [provider]  furosemide (LASIX) 40 MG tablet Take 80 mg by mouth 2 (two) times daily.  08/10/14  Yes Verlee Monte, MD  insulin lispro (HUMALOG) 100 UNIT/ML KwikPen Inject 0-9 Units into the skin 3 (three) times daily.  08/14/19  Yes [provider]  Omega-3 Fatty Acids (FISH OIL) 1000 MG CAPS Take 1,000 mg by mouth 2 (two) times daily.   Yes [provider]  ezetimibe (ZETIA) 10 MG tablet Take 1 tablet (10 mg total) by mouth daily. Patient not taking: Reported on 02/09/2020 02/21/17 02/08/29  Leonie Man, MD  glucose blood (ACCU-CHEK AVIVA PLUS) test strip 1 each by Other route See admin instructions. Use one strip to check glucose before meals and at bedtime. 06/18/14   [provider]  insulin aspart (NOVOLOG) 100 UNIT/ML injection Inject 0-9 Units into the skin 3 (three) times daily before meals. 0-9 Units, Subcutaneous, 3 times daily with meals CBG < 70: Implement Hypoglycemia measures CBG 70 - 120: 0 units CBG 121 - 150: 1 unit CBG 151 - 200: 2 units CBG 201 - 250: 3 units CBG 251 - 300: 5 units CBG 301 - 350: 7 units CBG 351 - 400: 9 units CBG > 400: call MD Patient not taking: Reported on 02/03/2020 08/16/19   Lavina Hamman, MD  Insulin  Syringe-Needle U-100 (INSULIN SYRINGE .3CC/31GX5/16") 31G X 5/16" 0.3 ML MISC 1 Act by Does not apply route 4 (four) times daily -  before meals and at bedtime. Patient taking differently: 1 each by Other route 4 (four) times daily -  before meals and at bedtime.  08/16/19   Lavina Hamman, MD  metaxalone (SKELAXIN) 800 MG tablet Take 0.5-1 tablets (400-800 mg total) by mouth 3 (three) times daily. Patient not taking: Reported on 02/03/2020 07/02/17   Lysbeth Penner, FNP  OXYGEN Inhale 2.5 L into the lungs as needed (Breathing.).     [provider]   No Known Allergies Review of Systems  Unable to perform ROS: Mental status change   Physical Exam Vitals and nursing note reviewed.  Constitutional:      Comments: Ill appearing obese F, slow to respond to questions  HENT:     Head: Normocephalic.     Nose: Nose normal.     Mouth/Throat:     Mouth: Mucous membranes are dry.  Eyes:     Pupils: Pupils are equal, round, and reactive to light.  Cardiovascular:     Rate and Rhythm: Normal rate and regular rhythm.     Pulses: Normal pulses.  Pulmonary:     Effort: Pulmonary effort is normal.  Abdominal:     Palpations:  Abdomen is soft.  Musculoskeletal:        General: Normal range of motion.     Cervical back: Normal range of motion.  Skin:    General: Skin is dry.     Capillary Refill: Capillary refill takes less than 2 seconds.  Neurological:     Mental Status: She is disoriented.  Psychiatric:     Comments: Withdrawn    Vital Signs: BP (!) 174/57 (BP Location: Left Arm)   Pulse 64   Temp 97.9 F (36.6 C) (Oral)   Resp (!) 22   Ht 5\' 2"  (1.575 m)   Wt 126.6 kg   SpO2 93%   BMI 51.03 kg/m  Pain Scale: 0-10   Pain Score: 0-No pain  SpO2: SpO2: 93 % O2 Device:SpO2: 93 % O2 Flow Rate: .   IO: Intake/output summary:   Intake/Output Summary (Last 24 hours) at 02/14/2020 1009 Last data filed at 02/14/2020 0749 Gross per 24 hour  Intake 60 ml  Output 500 ml   Net -440 ml   LBM: Last BM Date: 02/13/20 Baseline Weight: Weight: 95 kg Most recent weight: Weight: 126.6 kg     Palliative Assessment/Data: 20% Time In:  1220 Time Out:1330 Time Total: 70 Greater than 50%  of this time was spent counseling and coordinating care related to the above assessment and plan.  Signed by: Rosezella Rumpf, NP   Please contact Palliative Medicine Team phone at [phone] for questions and concerns.  For individual provider: See Shea Evans

## 2020-02-14 NOTE — Progress Notes (Signed)
Spoke with Mickel Baas, with [facility]. Requested swallow eval be completed today.

## 2020-02-14 NOTE — Progress Notes (Signed)
Patient denies complaints during shift report.   Secure message sent to MD with the following update. Will page MD later, if the same is noted by day shift.   Per report from off-going nurse, patient is pocketing food/meds in her cheek with delayed swallowing followed by coughing. I didnt see a note from Speech, but believe it would be reasonable for pt to have a swallow eval.

## 2020-02-14 NOTE — Progress Notes (Signed)
Call placed to Daughter, [name]. She and her brother Rogelio Seen are on their way to the hospital at this time. They were notified of their mothers refusal to take medications at this time, and plan to discuss with her upon their arrival.

## 2020-02-14 NOTE — Progress Notes (Signed)
Attempted to return call to daughter [name], the call went straight to voicemail; stating one is not set-up for that number.

## 2020-02-15 LAB — RENAL FUNCTION PANEL
Albumin: 2.2 g/dL — ABNORMAL LOW (ref 3.5–5.0)
Anion gap: 15 (ref 5–15)
BUN: 61 mg/dL — ABNORMAL HIGH (ref 8–23)
CO2: 23 mmol/L (ref 22–32)
Calcium: 8.5 mg/dL — ABNORMAL LOW (ref 8.9–10.3)
Chloride: 103 mmol/L (ref 98–111)
Creatinine, Ser: 3.76 mg/dL — ABNORMAL HIGH (ref 0.44–1.00)
GFR calc Af Amer: 14 mL/min — ABNORMAL LOW (ref >60)
GFR calc non Af Amer: 12 mL/min — ABNORMAL LOW (ref >60)
Glucose, Bld: 143 mg/dL — ABNORMAL HIGH (ref 70–99)
Phosphorus: 2.7 mg/dL (ref 2.5–4.6)
Potassium: 3.8 mmol/L (ref 3.5–5.1)
Sodium: 141 mmol/L (ref 135–145)

## 2020-02-15 LAB — GLUCOSE, CAPILLARY
Glucose-Capillary: 132 mg/dL — ABNORMAL HIGH (ref 70–99)
Glucose-Capillary: 130 mg/dL — ABNORMAL HIGH (ref 70–99)
Glucose-Capillary: 102 mg/dL — ABNORMAL HIGH (ref 70–99)
Glucose-Capillary: 109 mg/dL — ABNORMAL HIGH (ref 70–99)

## 2020-02-15 LAB — MAGNESIUM: Magnesium: 1.9 mg/dL (ref 1.7–2.4)

## 2020-02-15 LAB — CBC
HCT: 27.2 % — ABNORMAL LOW (ref 36.0–46.0)
Hemoglobin: 8.3 g/dL — ABNORMAL LOW (ref 12.0–15.0)
MCH: 31.3 pg (ref 26.0–34.0)
MCHC: 30.5 g/dL (ref 30.0–36.0)
MCV: 102.6 fL — ABNORMAL HIGH (ref 80.0–100.0)
Platelets: 96 10*3/uL — ABNORMAL LOW (ref 150–400)
RBC: 2.65 MIL/uL — ABNORMAL LOW (ref 3.87–5.11)
RDW: 15.5 % (ref 11.5–15.5)
WBC: 9.4 10*3/uL (ref 4.0–10.5)
nRBC: 0 % (ref 0.0–0.2)

## 2020-02-15 MED ORDER — HEPARIN SODIUM (PORCINE) 1000 UNIT/ML IJ SOLN
INTRAMUSCULAR | Status: AC
  Filled 2020-02-15: qty 3

## 2020-02-15 NOTE — Progress Notes (Addendum)
Renal Navigator notes documentation of need for OP HD clinic referral to be completed and confirmed with Dr. Webb/Nephrologist that patient has been diagnosed with ESRD at this point. Navigator attempted to meet with patient at HD bedside, but she was either asleep or non-communicative. Navigator contacted patient's daughter, [name], who said she was at work and could not talk. She asked Navigator to call her sister [name]. Navigator spoke with [name], who appeared to be talking with her other sister, [name], in the background. Assessment completed. Navigator submitted request to [facility] Admissions for OP HD treatment for ESRD to [facility] with a preference stated for second shift if possible.  [name] stated that they will need [facility] [place] to start immediately from the hospital. Patient is wheelchair bound per hospital notes. Per PT notes, patient will require hoyer lift. Renal Navigator spoke with CSW/A. Hill who confirms that patient will be ordered a lift/pad at discharged for use at home. Navigator will educate on the need for patient to have pad under her when she goes to HD, but asked CSW to reiterate this need.  Navigator has arranged to meet with one of patient's daughters at bedside tomorrow to complete [facility] [place] application for OP HD transportation.  Renal Navigator will follow closely.  Alphonzo [name], [place] Renal Navigator [phone]

## 2020-02-15 NOTE — Progress Notes (Addendum)
Pt very poor appetite. Able to swallow meds but took a long time to swallow. Ate 10% of breakfastfast. Pt came back from HD, O2 placed by HD RN, pts Sats dropped to 80'swhile in HD,placed on 2Lnc.

## 2020-02-15 NOTE — Progress Notes (Addendum)
PROGRESS NOTE  [name] HQP:[id number] DOB: [birth year]   PCP: Katherina Mires, MD  Patient is from: Home.  Lives with her daughters.  Uses wheelchair at baseline.  Also bedbound for most part  DOA: 02/09/2020 LOS: 5  Brief Narrative / Interim history: 69 year old female with history of DM-2, CAD s/p BMS to RCA, CVA, thrombocytopenia, diastolic CHF, CKD-5 with LUE aVF placed in 07/2019, PAD s/p left BKA presenting with progressive redness and edema of right lower extremity, and admitted for right lower extremity cellulitis, fluid overload and acute on chronic diastolic CHF.  Patient was started on IV vancomycin for cellulitis.    Nephrology consulted for AKI.  Continued on p.o. Lasix 80 mg twice daily.  Renal function continued to get worse.  Lasix discontinued.  Started on gentle IV fluid.  Temporary dialysis catheter placed on 3/26.  Renal function worse despite IV fluid. Started hemodialysis on 3/27.  In regards to cellulitis, she remained afebrile.  No leukocytosis.  RLE wrapped in [employer].  Transition to p.o. doxycycline.   02/14/2020: Patient seen alongside patient's 2 daughters and patient's nurse.  Nephrology input is appreciated.  Patient has chronic kidney disease stage V that may have progressed to end-stage renal disease.  Repeat hemodialysis is planned for tomorrow.  For now, patient has right IJ temporary access.  As per nephrology team's note, [facility] is planned, and more permanent access planned later.  From what I understand, patient has right upper extremity AV fistula that is not usable.  Patient is known to the vascular surgery team.  02/15/2020: Lethargy is slowly improving.  Patient underwent hemodialysis earlier today.  Input from nephrology team is appreciated.  Possible repeat hemodialysis tomorrow.  Subjective: Minimal history from the patient.  Objective: Vitals:   02/15/20 1500 02/15/20 1530 02/15/20 1600 02/15/20 1623  BP: (!) 125/54 (!) 152/65 138/60 (!) 139/59    Pulse: 68 69 67 68  Resp:    (!) 21  Temp:    99.2 F (37.3 C)  TempSrc:    Oral  SpO2:    100%  Weight:    129.3 kg  Height:        Intake/Output Summary (Last 24 hours) at 02/15/2020 1802 Last data filed at 02/15/2020 1623 Gross per 24 hour  Intake 120 ml  Output 500 ml  Net -380 ml   Filed Weights   02/15/20 0356 02/15/20 1319 02/15/20 1623  Weight: 126.1 kg 129.7 kg 129.3 kg    Examination:  GENERAL: Lethargic, but improving.  Right IJ HEENT: Dry buccal mucosa.  Pallor noted. NECK: Supple.  No apparent JVD.  RESP: Clear to auscultation. CVS: Z9-D3, systolic murmur with increased intensity of the S2 component of the heart sound. ABD/GI/GU: Obese, soft and nontender.  Organs are not palpable. MSK/EXT: Unna boot over RLE.  Left BKA. NEURO: Lethargic, but improving.  Patient moves all extremities..    Procedures:  None  Assessment & Plan: Acute on chronic diastolic CHF complicated by advanced renal failure: Echo in 2018 with EF of 65 to 70%, G1 DD, and moderate LAE.  Presented with SOB, DOE and fluid overload.  CXR with cardiomegaly and interstitial edema.  Had about 540 cc UOP overnight.  Renal function and azotemia worse despite IV fluid. -Discontinue IV fluid -Plan to start HD by nephrology. 02/14/2020: Suspect pulmonary edema/volume overload secondary to CKD 5 mm of progressed to end-stage renal disease.  Patient is already on hemodialysis.  Further.  Hemodialysis in the morning.  AKI on CKD-5/azotemia>> ESRD: Cr 3.50 (on 3/17)> 3.98 (admit)> 4.22>>> 5.06.  BUN 68> 98> 101.  No improvement with Lasix or IV fluid challenge.  Renal US without acute finding to explain.  Has right aVF placed in 07/2019 but not able to use.   -Management as above 02/14/2020: AKI on CKD 5 versus progression to end-stage renal disease.  See above documentation.  Nephrology team is managing. 02/15/2020: Volume status is improved significantly.  Possible repeat hemodialysis in the  morning.  RLE cellulitis: RLE in Unna boot this morning.  Has been on vancomycin since admission.  No fever or leukocytosis.  DVT studies and blood cultures negative. -De-escalated antibiotic to doxycycline on 3/25. -Continue Unna boot -Appreciate wound care RN input 02/14/2020: Right Unna boot to right lower extremity.  Skin right lower extremity not visualized.  Patient is on doxycycline.  02/15/2020: Complete course of antibiotics.  Acute metabolic encephalopathy-multifactorial including hyperammonia (45), azotemia (98) and possibly delirium.  No new focal neuro deficit.  She had RUE weakness (chronic per her daughter) from her old stroke.  CT head without acute finding.  RPR negative.  B12 high.  TSH normal. -Treat renal failure and azotemia as above -Lactulose 20 GM twice daily for elevated ammonia -Frequent orientation and delirium precautions. -Avoid sedating medications. 02/15/2020: Lethargy is improving with hemodialysis..  Controlled DM-2 with CKD-5 and hyperglycemia: A1c 6.0%. Recent Labs    02/14/20 2111 02/15/20 0603 02/15/20 0857  GLUCAP 128* 132* 130*  -Continue current regimen 02/14/2020: Continue to optimize.  Normocytic anemia:  Anemia panel features both IDA and ACD. B/l Hgb 8-9>> 8.1 (admit)> 8.0> 7.9> 8.1.  Folic acid 5.7. -May benefit from transfusion given history of CAD -ESA and iron per nephrology -Folic acid 1/54/0086: Not at goal.  Nephrology team is managing.  History of PAD s/p left BKA -Continue Lipitor, Plavix, Zetia  History of CVA with RUE weakness. -Lipitor, Plavix and Zetia as above  History of CAD s/p BMS to RCA in 2010: Stable -Continue home cardiac meds  Morbid obesity:Body mass index is 52.13 kg/m. -Lifestyle change to lose weight  Debility/ambulatory dysfunction inpatient with left BKA in 2015-uses wheelchair at baseline. -PT/OT-recommended SNF  Thrombocytopenia: Baseline 120s to 130s> 89 (admit)> 74> 83> 86 -Continue  monitoring.  Goal of care: Patient with comorbidities as above.  Limited mobility.  Uses wheelchair at baseline.  She is full code per her daughter.  Now with ESRD starting dialysis. -Palliative care consulted.   DVT prophylaxis: None in the setting of thrombocytopenia.  She has Haematologist over RLE.  Left BKA. Code Status: Full code-confirmed with daughter, [name]. Family Communication: Updated patient's daughter, [name] on 3/26.  Discharge barrier: Progressive renal failure now requiring dialysis, acute metabolic encephalopathy Patient is from: Home Final disposition: Likely SNF when medically stable and cleared by nephrology  Consultants: Nephrology   Microbiology summarized: COVID-19 negative   Sch Meds:  Scheduled Meds:  allopurinol  50 mg Oral Daily   antiseptic oral rinse  15 mL Mouth Rinse BID   atorvastatin  80 mg Oral q1800   buPROPion  100 mg Oral TID   carvedilol  25 mg Oral Q12H   Chlorhexidine Gluconate Cloth  6 each Topical Daily   clopidogrel  75 mg Oral Daily   darbepoetin (ARANESP) injection - NON-DIALYSIS  60 mcg Subcutaneous Q Thu-1800   doxycycline  100 mg Oral P61P   folic acid  1 mg Oral Daily   heparin       insulin  aspart  0-6 Units Subcutaneous TID WC   lactulose  20 g Oral BID   omega-3 acid ethyl esters  1 g Oral BID   sodium chloride flush  3 mL Intravenous Q12H   sodium chloride flush  3 mL Intravenous Q12H   Continuous Infusions:  sodium chloride Stopped (02/10/20 1409)   ferumoxytol     PRN Meds:.sodium chloride, acetaminophen **OR** acetaminophen, heparin, ondansetron **OR** ondansetron (ZOFRAN) IV, Resource ThickenUp Clear, sodium chloride flush  Antimicrobials: Anti-infectives (From admission, onward)   Start     Dose/Rate Route Frequency Ordered Stop   02/11/20 1415  doxycycline (VIBRA-TABS) tablet 100 mg     100 mg Oral Every 12 hours 02/11/20 1319     02/11/20 1015  vancomycin (VANCOREADY) IVPB 750 mg/150 mL      750 mg 150 mL/hr over 60 Minutes Intravenous NOW 02/11/20 0916 02/11/20 1148   02/09/20 0315  vancomycin (VANCOREADY) IVPB 2000 mg/400 mL     2,000 mg 200 mL/hr over 120 Minutes Intravenous  Once 02/09/20 0300 02/09/20 0638   02/09/20 0302  vancomycin variable dose per unstable renal function (pharmacist dosing)  Status:  Discontinued      Does not apply See admin instructions 02/09/20 0302 02/11/20 1319   02/09/20 0245  vancomycin (VANCOCIN) IVPB 1000 mg/200 mL premix  Status:  Discontinued     1,000 mg 200 mL/hr over 60 Minutes Intravenous  Once 02/09/20 0243 02/09/20 0300       I have personally reviewed the following labs and images: CBC: Recent Labs  Lab 02/09/20 0122 02/09/20 0122 02/10/20 0452 02/10/20 0452 02/11/20 0420 02/12/20 0439 02/13/20 0521 02/14/20 0722 02/15/20 0554  WBC 5.6   < > 6.9   < > 9.2 8.6 8.7 9.6 9.4  NEUTROABS 4.4  --  5.5  --   --   --   --   --   --   HGB 9.5*   < > 8.1*   < > 8.0* 7.9* 8.1* 8.4* 8.3*  HCT 31.1*   < > 26.1*   < > 26.6* 26.2* 26.8* 26.7* 27.2*  MCV 102.6*   < > 101.2*   < > 104.3* 103.6* 104.3* 100.4* 102.6*  PLT 89*   < > 74*   < > 83* 82* 86* 84* 96*   < > = values in this interval not displayed.   BMP &GFR Recent Labs  Lab 02/11/20 0420 02/12/20 0439 02/13/20 0521 02/14/20 0722 02/15/20 0554 02/15/20 1357  NA 139 141 142 143  --  141  K 3.9 4.2 3.9 3.6  --  3.8  CL 106 106 108 108  --  103  CO2 20* 21* 20* 22  --  23  GLUCOSE 108* 105* 205* 109*  --  143*  BUN 92* 98* 101* 57*  --  61*  CREATININE 4.57* 4.82* 5.06* 3.40*  --  3.76*  CALCIUM 8.4* 8.6* 8.7* 8.8*  --  8.5*  MG  --  1.9 2.0 1.7 1.9  --   PHOS  --  3.8 3.8 2.5  --  2.7   Estimated Creatinine Clearance: 18.5 mL/min (A) (by C-G formula based on SCr of 3.76 mg/dL (H)). Liver & Pancreas: Recent Labs  Lab 02/09/20 0122 02/09/20 0122 02/09/20 0109 02/10/20 3235 02/10/20 5732 02/11/20 0420 02/12/20 0439 02/13/20 0521 02/14/20 0722  02/15/20 1357  AST 13*  --   --  11*  --  10*  --   --   --   --  ALT 14  --   --  14  --  13  --   --   --   --   ALKPHOS 98  --   --  95  --  108  --   --   --   --   BILITOT 2.2*  --  2.2* 2.1*  --  2.4*  --   --   --   --   PROT 6.1*  --   --  5.7*  --  5.6*  --   --   --   --   ALBUMIN 2.9*   < >  --  2.5*   < > 2.3* 2.2* 2.3* 2.3* 2.2*   < > = values in this interval not displayed.   No results for input(s): LIPASE, AMYLASE in the last 168 hours. Recent Labs  Lab 02/12/20 0439 02/13/20 0521  AMMONIA 45* 42*   Diabetic: No results for input(s): HGBA1C in the last 72 hours. Recent Labs  Lab 02/14/20 1118 02/14/20 1700 02/14/20 2111 02/15/20 0603 02/15/20 0857  GLUCAP 125* 123* 128* 132* 130*   Cardiac Enzymes: No results for input(s): CKTOTAL, CKMB, CKMBINDEX, TROPONINI in the last 168 hours. No results for input(s): PROBNP in the last 8760 hours. Coagulation Profile: No results for input(s): INR, PROTIME in the last 168 hours. Thyroid Function Tests: Recent Labs    02/13/20 0521  TSH 4.046   Lipid Profile: No results for input(s): CHOL, HDL, LDLCALC, TRIG, CHOLHDL, LDLDIRECT in the last 72 hours. Anemia Panel: No results for input(s): VITAMINB12, FOLATE, FERRITIN, TIBC, IRON, RETICCTPCT in the last 72 hours. Urine analysis:    Component Value Date/Time   COLORURINE YELLOW 08/09/2019 1518   APPEARANCEUR HAZY (A) 08/09/2019 1518   LABSPEC 1.011 08/09/2019 1518   PHURINE 8.0 08/09/2019 1518   GLUCOSEU NEGATIVE 08/09/2019 1518   HGBUR NEGATIVE 08/09/2019 1518   BILIRUBINUR NEGATIVE 08/09/2019 1518   KETONESUR NEGATIVE 08/09/2019 1518   PROTEINUR 30 (A) 08/09/2019 1518   UROBILINOGEN 0.2 08/24/2015 1002   NITRITE NEGATIVE 08/09/2019 1518   LEUKOCYTESUR LARGE (A) 08/09/2019 1518   Sepsis Labs: Invalid input(s): PROCALCITONIN, [place]  Microbiology: Recent Results (from the past 240 hour(s))  Blood culture (routine x 2)     Status: None   Collection  Time: 02/09/20  1:25 AM   Specimen: BLOOD LEFT ARM  Result Value Ref Range Status   Specimen Description BLOOD LEFT ARM  Final   Special Requests   Final    BOTTLES DRAWN AEROBIC AND ANAEROBIC Blood Culture adequate volume   Culture   Final    NO GROWTH 5 DAYS Performed at [facility], [address] [place], [place] [address]    Report Status 02/14/2020 FINAL  Final  Respiratory Panel by RT PCR (Flu A&B, Covid) - Nasopharyngeal Swab     Status: None   Collection Time: 02/09/20  3:08 AM   Specimen: Nasopharyngeal Swab  Result Value Ref Range Status   SARS Coronavirus 2 by RT PCR NEGATIVE NEGATIVE Final    Comment: (NOTE) SARS-CoV-2 target nucleic acids are NOT DETECTED. The SARS-CoV-2 RNA is generally detectable in upper respiratoy specimens during the acute phase of infection. The lowest concentration of SARS-CoV-2 viral copies this assay can detect is 131 copies/mL. A negative result does not preclude SARS-Cov-2 infection and should not be used as the sole basis for treatment or other patient management decisions. A negative result may occur with  improper specimen collection/handling,  submission of specimen other than nasopharyngeal swab, presence of viral mutation(s) within the areas targeted by this assay, and inadequate number of viral copies (<131 copies/mL). A negative result must be combined with clinical observations, patient history, and epidemiological information. The expected result is Negative. Fact Sheet for Patients:  PinkCheek.be Fact Sheet for Healthcare Providers:  GravelBags.it This test is not yet ap proved or cleared by the [place] FDA and  has been authorized for detection and/or diagnosis of SARS-CoV-2 by FDA under an Emergency Use Authorization (EUA). This EUA will remain  in effect (meaning this test can be used) for the duration of the COVID-19 declaration under Section 564(b)(1)  of the Act, 21 U.S.C. section 360bbb-3(b)(1), unless the authorization is terminated or revoked sooner.    Influenza A by PCR NEGATIVE NEGATIVE Final   Influenza B by PCR NEGATIVE NEGATIVE Final    Comment: (NOTE) The Xpert Xpress SARS-CoV-2/FLU/RSV assay is intended as an aid in  the diagnosis of influenza from Nasopharyngeal swab specimens and  should not be used as a sole basis for treatment. Nasal washings and  aspirates are unacceptable for Xpert Xpress SARS-CoV-2/FLU/RSV  testing. Fact Sheet for Patients: PinkCheek.be Fact Sheet for Healthcare Providers: GravelBags.it This test is not yet approved or cleared by the [place] FDA and  has been authorized for detection and/or diagnosis of SARS-CoV-2 by  FDA under an Emergency Use Authorization (EUA). This EUA will remain  in effect (meaning this test can be used) for the duration of the  Covid-19 declaration under Section 564(b)(1) of the Act, 21  U.S.C. section 360bbb-3(b)(1), unless the authorization is  terminated or revoked. Performed at [facility], [place] [address], [place], [place] [address]   Blood culture (routine x 2)     Status: None   Collection Time: 02/09/20  3:15 AM   Specimen: BLOOD LEFT HAND  Result Value Ref Range Status   Specimen Description BLOOD LEFT HAND  Final   Special Requests   Final    BOTTLES DRAWN AEROBIC ONLY Blood Culture results may not be optimal due to an inadequate volume of blood received in culture bottles   Culture   Final    NO GROWTH 5 DAYS Performed at [facility], [place] [address], [place], [place] [address]    Report Status 02/14/2020 FINAL  Final    Radiology Studies: No results found.    Bonnell Public, M.D. [facility]  If 7PM-7AM, please contact night-coverage [email or web address] Password [facility] 02/15/2020, 6:02 PM

## 2020-02-15 NOTE — TOC Progression Note (Addendum)
Transition of Care [facility]) - Progression Note    Patient Details  Name: [name] MRN: [id number] Date of Birth: [birth year]  Transition of Care [facility]) CM/SW [place], [place] Phone Number: 02/15/2020, 1:06 PM  Clinical Narrative:     CSW spoke with patient's daughter Rogelio Seen who reports family would like to take patient home with home health, followed by [facility] outpatient palliative services. They request DME hospital bed, 3in1 and hoyer lift. Agreeable for CSW to send out referrals for home health agencies. CSW confirmed address in chart is accurate. Preferable for hospital bed to be delivered to home prior to discharge.   CSW has also followed up with Terri Piedra Renal Navigator to inform, she will identify outpatient HD center and let CSW know in order to assist in transportation needs to and from HD.  CSW spoke with Venia Carbon with [facility], referral made for outpatient palliative services she will follow up.    Expected Discharge Plan: [place] Barriers to Discharge: Continued Medical Work up  Expected Discharge Plan and Services Expected Discharge Plan: [place] arrangements for the past 2 months: [place]                 DME Arranged: Hospital bed, 3-N-1(Hoyer lift) DME Agency: [facility]                   Social Determinants of Health (SDOH) Interventions    Readmission Risk Interventions No flowsheet data found.

## 2020-02-15 NOTE — Progress Notes (Signed)
   02/15/20 1105  Clinical Encounter Type  Visited With Patient  Visit Type Initial  Referral From Palliative care team  Consult/Referral To Chaplain  Stress Factors  Patient Stress Factors None identified  Family Stress Factors None identified    Chaplain responded to consult for palliative care. [name] was very sleepy and gave head nod responses. She nodded yes that her name was [name]. She nodded no that is was not a good time for a visit. She nodded yes to come back tomorrow. Chaplain will refer [name] to the 6E unit Chaplain De Burrs to follow up on Tuesday 02/16/20. Chaplains remain available for support as needs arise.   Chaplain Resident, Evelene Croon, M Div [phone] on-call pager

## 2020-02-15 NOTE — Progress Notes (Signed)
  Speech Language Pathology Treatment: Dysphagia  Patient Details Name: [name] MRN: [id number] DOB: [birth year] Today's Date: 02/15/2020 Time: 1021-1173 SLP Time Calculation (min) (ACUTE ONLY): 10 min  Assessment / Plan / Recommendation Clinical Impression  F/u after yesterday's clinical swallow assessment.  Lethargy remains primary obstacle to safe eating at this time. RN reports minimal intake this am.  Pt able to be roused sufficiently to state her name, location, and year, however she required max tactile/verbal cues to attend to straw/cup edge approaching her mouth; she required hand-over-hand help to hold the cup.  After encouragement and verbal cues she ultimately accepted limited sips of thin and nectar liquids. The thin liquids elicited immediate coughing; coughing was eliminated with nectar liquids.  She declined any further POs.  Recommend continuing conservative diet of dysphagia 1, nectar liquids until MS begins to improve.  SLP will follow along to determine ability to advance diet at bedside vs the value of an MBS.    HPI HPI: Pt is a 69 y/o female admitted 02/09/20 secondary to RLE cellulitis and volume overload. PMH includes CAD s/p stent, DM, CKD, CVA, dCHF, L BKA. Pt had a previous swallow evaluation in 2014 with functional appearing swallow until challenged with large, consecutive boluses. Regular solids and thin liquids via small, single sips recommended at that time.      SLP Plan  Continue with current plan of care       Recommendations  Diet recommendations: Dysphagia 1 (puree);Nectar-thick liquid Liquids provided via: Cup;Straw Medication Administration: Crushed with puree Supervision: Full supervision/cueing for compensatory strategies Compensations: Slow rate;Small sips/bites;Follow solids with liquid Postural Changes and/or Swallow Maneuvers: Seated upright 90 degrees                Oral Care Recommendations: Oral care BID Follow up Recommendations:  Skilled Nursing facility SLP Visit Diagnosis: Dysphagia, oropharyngeal phase (R13.12) Plan: Continue with current plan of care       GO                [name] 02/15/2020, 10:37 AM  [name], [name] Office number [phone] Pager [phone]

## 2020-02-15 NOTE — Progress Notes (Addendum)
Manufacturing engineer [facility]) Palliative Care Hospital Liaison Note:  Received Palliative Care Referral from [facility] TOC/CM [facility] for services after discharge. Reached out to patient daughter Rogelio Seen to explain services and answer questions but daughter requested a call back later this afternoon. Daughter did confirm interest in [place] and was given our Gerty Director's contact information in case we are not able to speak later today.   Our referral center is aware of patient interest and will also follow up with patient upon discharge.   Please reach out with any hospice or palliative related questions,  Thank you,  Gar Ponto, RN [place] (on [place]) [phone]  UPDATE at 1432: Spoke with patient dtr to explain [facility] Palliative care services, answer questions and support family. Patient family is agreeable to Palliative Care services after discharge home.

## 2020-02-15 NOTE — Progress Notes (Signed)
[facility] ROUNDING NOTE   Subjective:   This is 69 year old lady with history of diabetes coronary artery disease CVA thrombocytopenia diastolic heart failure CKD stage V history of left upper arm extremity AV fistula placed 07/2019, PAD status post left BKA fluid overload and right lower extremity cellulitis admitted 02/09/2020.  Thought to have uremic encephalopathy contributing to lethargy.  Was initiated on hemodialysis.  Scheduled for dialysis #2 02/15/2020.  She underwent first treatment dialysis 02/14/2020 with removal of 500 cc  Blood pressure 135/51 pulse 71 temperature 9.3 O2 sats 82%  Sodium 143 potassium 3.6 chloride 108 CO2 22 BUN 57 creatinine 3.4 glucose 109 calcium 8.8 magnesium 1.7 albumin 2.3 phosphorus 2.5 hemoglobin 8.3 platelets 96  Allopurinol 50 mg daily atorvastatin 80 mg daily Wellbutrin 100 mg 3 times daily Coreg 25 mg every 12 hours Plavix 75 mg daily darbepoetin 60 mcg every Thursday doxycycline 100 mg every 12 hours, insulin sliding scale  Objective:  Vital signs in last 24 hours:  Temp:  [98.1 F (36.7 C)-99.3 F (37.4 C)] 99.3 F (37.4 C) (03/29 0356) Pulse Rate:  [67-71] 71 (03/29 0356) Resp:  [16-20] 16 (03/29 0356) BP: (129-155)/(45-53) 155/51 (03/29 0356) SpO2:  [82 %-96 %] 82 % (03/29 0356) Weight:  [126.1 kg] 126.1 kg (03/29 0356)  Weight change: 0.1 kg Filed Weights   02/14/20 0052 02/14/20 0409 02/15/20 0356  Weight: 125.5 kg 126.6 kg 126.1 kg    Intake/Output: I/O last 3 completed shifts: In: 180 [P.O.:180] Out: 500 [Other:500]   Intake/Output this shift:  No intake/output data recorded.  Gen: Awake and comfortable nondistressed nonverbal CVS: Pulse regular rhythm, normal rate, S1 and S2 normal.  Right IJ hemodialysis catheter Resp: Poor inspiratory effort with decreased breath sounds over bases, no rales Abd: Soft, flat, nontender Ext: Right leg in Ace wrap, trace edema left ankle.  Short pulsatile right upper arm  AVF   Basic Metabolic Panel: Recent Labs  Lab 02/10/20 0452 02/10/20 0452 02/11/20 0420 02/11/20 0420 02/12/20 0439 02/13/20 0521 02/14/20 0722 02/15/20 0554  NA 139  --  139  --  141 142 143  --   K 3.9  --  3.9  --  4.2 3.9 3.6  --   CL 105  --  106  --  106 108 108  --   CO2 21*  --  20*  --  21* 20* 22  --   GLUCOSE 125*  --  108*  --  105* 205* 109*  --   BUN 78*  --  92*  --  98* 101* 57*  --   CREATININE 4.22*  --  4.57*  --  4.82* 5.06* 3.40*  --   CALCIUM 8.7*   < > 8.4*   < > 8.6* 8.7* 8.8*  --   MG  --   --   --   --  1.9 2.0 1.7 1.9  PHOS  --   --   --   --  3.8 3.8 2.5  --    < > = values in this interval not displayed.    Liver Function Tests: Recent Labs  Lab 02/09/20 0122 02/09/20 0122 02/09/20 0316 02/10/20 0452 02/11/20 0420 02/12/20 0439 02/13/20 0521 02/14/20 0722  AST 13*  --   --  11* 10*  --   --   --   ALT 14  --   --  14 13  --   --   --   ALKPHOS 98  --   --  95 108  --   --   --   BILITOT 2.2*  --  2.2* 2.1* 2.4*  --   --   --   PROT 6.1*  --   --  5.7* 5.6*  --   --   --   ALBUMIN 2.9*   < >  --  2.5* 2.3* 2.2* 2.3* 2.3*   < > = values in this interval not displayed.   No results for input(s): LIPASE, AMYLASE in the last 168 hours. Recent Labs  Lab 02/12/20 0439 02/13/20 0521  AMMONIA 45* 42*    CBC: Recent Labs  Lab 02/09/20 0122 02/09/20 0122 02/10/20 0452 02/10/20 0452 02/11/20 0420 02/12/20 0439 02/13/20 0521 02/14/20 0722 02/15/20 0554  WBC 5.6   < > 6.9   < > 9.2 8.6 8.7 9.6 9.4  NEUTROABS 4.4  --  5.5  --   --   --   --   --   --   HGB 9.5*   < > 8.1*   < > 8.0* 7.9* 8.1* 8.4* 8.3*  HCT 31.1*   < > 26.1*   < > 26.6* 26.2* 26.8* 26.7* 27.2*  MCV 102.6*   < > 101.2*   < > 104.3* 103.6* 104.3* 100.4* 102.6*  PLT 89*   < > 74*   < > 83* 82* 86* 84* 96*   < > = values in this interval not displayed.    Cardiac Enzymes: No results for input(s): CKTOTAL, CKMB, CKMBINDEX, TROPONINI in the last 168  hours.  BNP: Invalid input(s): POCBNP  CBG: Recent Labs  Lab 02/14/20 0605 02/14/20 1118 02/14/20 1700 02/14/20 2111 02/15/20 0603  GLUCAP 107* 125* 123* 128* 132*    Microbiology: Results for orders placed or performed during the hospital encounter of 02/09/20  Blood culture (routine x 2)     Status: None   Collection Time: 02/09/20  1:25 AM   Specimen: BLOOD LEFT ARM  Result Value Ref Range Status   Specimen Description BLOOD LEFT ARM  Final   Special Requests   Final    BOTTLES DRAWN AEROBIC AND ANAEROBIC Blood Culture adequate volume   Culture   Final    NO GROWTH 5 DAYS Performed at [facility], [place] [address], [place], [place] [address]    Report Status 02/14/2020 FINAL  Final  Respiratory Panel by RT PCR (Flu A&B, Covid) - Nasopharyngeal Swab     Status: None   Collection Time: 02/09/20  3:08 AM   Specimen: Nasopharyngeal Swab  Result Value Ref Range Status   SARS Coronavirus 2 by RT PCR NEGATIVE NEGATIVE Final    Comment: (NOTE) SARS-CoV-2 target nucleic acids are NOT DETECTED. The SARS-CoV-2 RNA is generally detectable in upper respiratoy specimens during the acute phase of infection. The lowest concentration of SARS-CoV-2 viral copies this assay can detect is 131 copies/mL. A negative result does not preclude SARS-Cov-2 infection and should not be used as the sole basis for treatment or other patient management decisions. A negative result may occur with  improper specimen collection/handling, submission of specimen other than nasopharyngeal swab, presence of viral mutation(s) within the areas targeted by this assay, and inadequate number of viral copies (<131 copies/mL). A negative result must be combined with clinical observations, patient history, and epidemiological information. The expected result is Negative. Fact Sheet for Patients:  PinkCheek.be Fact Sheet for Healthcare Providers:   GravelBags.it This test is not yet ap proved or cleared by the [place]  States FDA and  has been authorized for detection and/or diagnosis of SARS-CoV-2 by FDA under an Emergency Use Authorization (EUA). This EUA will remain  in effect (meaning this test can be used) for the duration of the COVID-19 declaration under Section 564(b)(1) of the Act, 21 U.S.C. section 360bbb-3(b)(1), unless the authorization is terminated or revoked sooner.    Influenza A by PCR NEGATIVE NEGATIVE Final   Influenza B by PCR NEGATIVE NEGATIVE Final    Comment: (NOTE) The Xpert Xpress SARS-CoV-2/FLU/RSV assay is intended as an aid in  the diagnosis of influenza from Nasopharyngeal swab specimens and  should not be used as a sole basis for treatment. Nasal washings and  aspirates are unacceptable for Xpert Xpress SARS-CoV-2/FLU/RSV  testing. Fact Sheet for Patients: PinkCheek.be Fact Sheet for Healthcare Providers: GravelBags.it This test is not yet approved or cleared by the [place] FDA and  has been authorized for detection and/or diagnosis of SARS-CoV-2 by  FDA under an Emergency Use Authorization (EUA). This EUA will remain  in effect (meaning this test can be used) for the duration of the  Covid-19 declaration under Section 564(b)(1) of the Act, 21  U.S.C. section 360bbb-3(b)(1), unless the authorization is  terminated or revoked. Performed at [facility], [place] [address] [place], [place] [address]   Blood culture (routine x 2)     Status: None   Collection Time: 02/09/20  3:15 AM   Specimen: BLOOD LEFT HAND  Result Value Ref Range Status   Specimen Description BLOOD LEFT HAND  Final   Special Requests   Final    BOTTLES DRAWN AEROBIC ONLY Blood Culture results may not be optimal due to an inadequate volume of blood received in culture bottles   Culture   Final    NO GROWTH 5 DAYS Performed at [facility], [place] [address] [place], [place] [address]    Report Status 02/14/2020 FINAL  Final    Coagulation Studies: No results for input(s): LABPROT, INR in the last 72 hours.  Urinalysis: No results for input(s): COLORURINE, LABSPEC, PHURINE, GLUCOSEU, HGBUR, BILIRUBINUR, KETONESUR, PROTEINUR, UROBILINOGEN, NITRITE, LEUKOCYTESUR in the last 72 hours.  Invalid input(s): APPERANCEUR    Imaging: No results found.   Medications:   . sodium chloride Stopped (02/10/20 1409)  . ferumoxytol     . allopurinol  50 mg Oral Daily  . antiseptic oral rinse  15 mL Mouth Rinse BID  . atorvastatin  80 mg Oral q1800  . buPROPion  100 mg Oral TID  . carvedilol  25 mg Oral Q12H  . Chlorhexidine Gluconate Cloth  6 each Topical Daily  . clopidogrel  75 mg Oral Daily  . darbepoetin (ARANESP) injection - NON-DIALYSIS  60 mcg Subcutaneous Q Thu-1800  . doxycycline  100 mg Oral Q12H  . folic acid  1 mg Oral Daily  . insulin aspart  0-6 Units Subcutaneous TID WC  . lactulose  20 g Oral BID  . omega-3 acid ethyl esters  1 g Oral BID  . sodium chloride flush  3 mL Intravenous Q12H  . sodium chloride flush  3 mL Intravenous Q12H   sodium chloride, acetaminophen **OR** acetaminophen, heparin, ondansetron **OR** ondansetron (ZOFRAN) IV, Resource ThickenUp Clear, sodium chloride flush  Assessment/ Plan:  1. Acute kidney Injury on chronic kidney disease stage V: She remains lethargic and nonoliguric overnight even after hemodialysis yesterday (done for presumption that this was uremic encephalopathy).  Started on dialysis via temporary IJ dialysis  catheter placed by IR after RUA AVF deemed unusable; will schedule her next dialysis 02/15/2020.  She will need conversion to [facility] and placement of permanent access next week.  We will consider additional dialysis 02/16/2020 if patient remains lethargic. 2.  Right lower extremity cellulitis: Clinically improving on oral doxycycline.  With [employer]. 3.   History of congestive heart failure with diastolic dysfunction: Currently appears to be compensated with dialysis started yesterday.  Leave off of diuretics and continue to monitor on dialysis. 4.  Anemia: Downtrending hemoglobin and hematocrit, with iron deficiency and on ESA. 5.  Secondary hyperparathyroidism: Calcium and phosphorus levels currently at goal, poor oral intake and not on binders   LOS: [place] @TODAY @8 :57 AM

## 2020-02-16 LAB — CBC
HCT: 25.2 % — ABNORMAL LOW (ref 36.0–46.0)
HCT: 25.1 % — ABNORMAL LOW (ref 36.0–46.0)
Hemoglobin: 7.6 g/dL — ABNORMAL LOW (ref 12.0–15.0)
Hemoglobin: 7.5 g/dL — ABNORMAL LOW (ref 12.0–15.0)
MCH: 30.8 pg (ref 26.0–34.0)
MCH: 30.9 pg (ref 26.0–34.0)
MCHC: 30.2 g/dL (ref 30.0–36.0)
MCHC: 29.9 g/dL — ABNORMAL LOW (ref 30.0–36.0)
MCV: 102 fL — ABNORMAL HIGH (ref 80.0–100.0)
MCV: 103.3 fL — ABNORMAL HIGH (ref 80.0–100.0)
Platelets: 92 10*3/uL — ABNORMAL LOW (ref 150–400)
Platelets: 93 10*3/uL — ABNORMAL LOW (ref 150–400)
RBC: 2.47 MIL/uL — ABNORMAL LOW (ref 3.87–5.11)
RBC: 2.43 MIL/uL — ABNORMAL LOW (ref 3.87–5.11)
RDW: 15.1 % (ref 11.5–15.5)
RDW: 15 % (ref 11.5–15.5)
WBC: 11.5 10*3/uL — ABNORMAL HIGH (ref 4.0–10.5)
WBC: 11.3 10*3/uL — ABNORMAL HIGH (ref 4.0–10.5)
nRBC: 0 % (ref 0.0–0.2)
nRBC: 0 % (ref 0.0–0.2)

## 2020-02-16 LAB — GLUCOSE, CAPILLARY
Glucose-Capillary: 107 mg/dL — ABNORMAL HIGH (ref 70–99)
Glucose-Capillary: 108 mg/dL — ABNORMAL HIGH (ref 70–99)
Glucose-Capillary: 129 mg/dL — ABNORMAL HIGH (ref 70–99)
Glucose-Capillary: 132 mg/dL — ABNORMAL HIGH (ref 70–99)
Glucose-Capillary: 141 mg/dL — ABNORMAL HIGH (ref 70–99)
Glucose-Capillary: 197 mg/dL — ABNORMAL HIGH (ref 70–99)

## 2020-02-16 LAB — MAGNESIUM: Magnesium: 1.7 mg/dL (ref 1.7–2.4)

## 2020-02-16 MED ORDER — CHLORHEXIDINE GLUCONATE CLOTH 2 % EX PADS
6.0000 | MEDICATED_PAD | Freq: Every day | CUTANEOUS | Status: DC
  Administered 2020-02-16: 6 via TOPICAL

## 2020-02-16 MED ORDER — SODIUM CHLORIDE 0.9 % IV SOLN: 100.0000 mL | INTRAVENOUS | Status: DC | PRN

## 2020-02-16 MED ORDER — LIDOCAINE-PRILOCAINE 2.5-2.5 % EX CREA: 1.0000 "application " | TOPICAL_CREAM | CUTANEOUS | Status: DC | PRN

## 2020-02-16 MED ORDER — HEPARIN SODIUM (PORCINE) 1000 UNIT/ML DIALYSIS
1000.0000 [IU] | INTRAMUSCULAR | Status: DC | PRN
  Filled 2020-02-16: qty 1

## 2020-02-16 MED ORDER — LIDOCAINE HCL (PF) 1 % IJ SOLN: 5.0000 mL | INTRAMUSCULAR | Status: DC | PRN

## 2020-02-16 MED ORDER — MIRTAZAPINE 15 MG PO TABS
7.5000 mg | ORAL_TABLET | Freq: Every day | ORAL | Status: DC
  Administered 2020-02-16 – 2020-02-25 (×10): 7.5 mg via ORAL
  Filled 2020-02-16 (×11): qty 1

## 2020-02-16 MED ORDER — HEPARIN SODIUM (PORCINE) 1000 UNIT/ML IJ SOLN
INTRAMUSCULAR | Status: AC
  Administered 2020-02-16: 2600 [IU] via INTRAVENOUS_CENTRAL
  Filled 2020-02-16: qty 3

## 2020-02-16 MED ORDER — PENTAFLUOROPROP-TETRAFLUOROETH EX AERO: 1.0000 "application " | INHALATION_SPRAY | CUTANEOUS | Status: DC | PRN

## 2020-02-16 MED ORDER — ALTEPLASE 2 MG IJ SOLR: 2.0000 mg | Freq: Once | INTRAMUSCULAR | Status: DC | PRN

## 2020-02-16 NOTE — Progress Notes (Signed)
PROGRESS NOTE    [name]  IZT:[id number] DOB: 1951-01-24 DOA: 02/09/2020 PCP: Katherina Mires, MD   Brief Narrative:  69 year old female with history of DM-2, CAD s/p BMS to RCA, CVA, thrombocytopenia, diastolic CHF, CKD-5 with LUE aVF placed in 07/2019, PAD s/p left BKA presenting with progressive redness and edema of right lower extremity, and admitted for right lower extremity cellulitis, fluid overload and acute on chronic diastolic CHF.  Patient was started on IV vancomycin for cellulitis.    Developed AKI therefore nephrology team consulted initially started on Lasix and gentle hydration.  Renal function failed to improve therefore started on hemodialysis on 3/27.  Currently has right IJ temporary dialysis catheter in place.   Assessment & Plan:   Principal Problem:   Acute on chronic diastolic CHF (congestive heart failure) (HCC) Active Problems:   Acute renal failure superimposed on stage 4 chronic kidney disease (HCC)   Type 2 diabetes mellitus with renal manifestations (HCC)   CAD S/P PCI TO RCA for Inferior STEMI. BMS   CKD (chronic kidney disease), stage IV (HCC)   Anemia in chronic kidney disease   CVA (cerebral vascular accident) ([place])   AKI (acute kidney injury) ([place])   Cellulitis of right leg   Thrombocytopenia (HCC)   Hyperbilirubinemia   Xerostomia   Advanced care planning/counseling discussion   Full code status   Dry skin   Physical debility   Palliative care by specialist   Goals of care, counseling/discussion  Acute kidney injury on CKD stage V -Patient is now on dialysis getting it via temporary dialysis catheter.  Plans to have permanent access later in time.  Nephrology team is following.  Had AV fistula placed in right upper extremity 9/20 but not ready to be used yet.  Acute on chronic diastolic congestive heart failure, ejection fraction 65% -Appears to be doing little better now with dialysis.  Right lower extremity cellulitis -Improving.  On  doxycycline (3/25> ) and right [place] boot  Acute metabolic encephalopathy -Combination of uremia/hyperammonia.  No focal neuro deficits.  CT of the head is negative.  TSH, B12 are normal -Lactulose twice daily -Continue supportive care, avoid sedative medication.  Diabetes mellitus type 2 controlled -Hemoglobin A1c 6.0.  Continue current regimen.  Normocytic anemia Mild folate deficiency -Continue folic acid, insulin sliding scale and Accu-Cheks  History of peripheral arterial disease status post left-sided BKA -Continue Plavix, Lipitor and Zetia  History of CVA/right upper extremity weakness -Continue home meds  Coronary artery disease status post bare-metal stent to RCA in 2010 -Continue home cardiac meds  Goals of care discussion-seen by palliative care team recommending full scope of medical treatment at this time.  Patient recovery would be challenging especially if her p.o. intake remains very minimal due to lack of desire to eat.  DVT prophylaxis: None secondary to thrombocytopenia Code Status: Full code Family Communication: None Disposition Plan:   Patient From= home  Patient Anticipated D/C place= PT recommending SNF  Barriers= maintain hospital stay until mentation improves.   Subjective: Patient is sitting up in the chair drowsy.  She is alert to her name but does not participate in long conversations.  Very poor oral intake and continues to pull food in her mouth and she has lack of desire to eat.  Review of Systems Otherwise negative except as per HPI, including: General: Denies fever, chills, night sweats or unintended weight loss. Resp: Denies cough, wheezing, shortness of breath. Cardiac: Denies chest pain, palpitations, orthopnea, paroxysmal nocturnal dyspnea.  GI: Denies abdominal pain, nausea, vomiting, diarrhea or constipation GU: Denies dysuria, frequency, hesitancy or incontinence MS: Denies muscle aches, joint pain or swelling Neuro: Denies  headache, neurologic deficits (focal weakness, numbness, tingling), abnormal gait Psych: Denies anxiety, depression, SI/HI/AVH Skin: Denies new rashes or lesions ID: Denies sick contacts, exotic exposures, travel  Examination:  General exam: Drowsy and sleepy. Respiratory system: Clear to auscultation. Respiratory effort normal. Cardiovascular system: S1 & S2 heard, RRR. No JVD, murmurs, rubs, gallops or clicks. No pedal edema. Gastrointestinal system: Abdomen is nondistended, soft and nontender. No organomegaly or masses felt. Normal bowel sounds heard. Central nervous system: Alert to her name only otherwise difficult to assess any focal neuro abnormality Extremities: Symmetric 5 x 5 power.  Left-sided BKA noted Skin: No rashes, lesions or ulcers Psychiatry: Poor judgment and insight.    Objective: Vitals:   02/16/20 1206 02/16/20 1300 02/16/20 1306 02/16/20 1330  BP: (!) 139/50 (!) 151/63 140/62 (!) 150/65  Pulse: 63 61 61 61  Resp:  17 15   Temp: 98.4 F (36.9 C) 98.3 F (36.8 C)    TempSrc:  Oral    SpO2: 97% 99%    Weight:  116.9 kg    Height:        Intake/Output Summary (Last 24 hours) at 02/16/2020 1349 Last data filed at 02/16/2020 0900 Gross per 24 hour  Intake 120 ml  Output 500 ml  Net -380 ml   Filed Weights   02/15/20 1623 02/16/20 0536 02/16/20 1300  Weight: 129.3 kg 116.6 kg 116.9 kg     Data Reviewed:   CBC: Recent Labs  Lab 02/10/20 0452 02/11/20 0420 02/12/20 0439 02/13/20 0521 02/14/20 0722 02/15/20 0554 02/16/20 0506  WBC 6.9   < > 8.6 8.7 9.6 9.4 11.5*  NEUTROABS 5.5  --   --   --   --   --   --   HGB 8.1*   < > 7.9* 8.1* 8.4* 8.3* 7.6*  HCT 26.1*   < > 26.2* 26.8* 26.7* 27.2* 25.2*  MCV 101.2*   < > 103.6* 104.3* 100.4* 102.6* 102.0*  PLT 74*   < > 82* 86* 84* 96* 92*   < > = values in this interval not displayed.   Basic Metabolic Panel: Recent Labs  Lab 02/11/20 0420 02/12/20 0439 02/13/20 0521 02/14/20 0722  02/15/20 0554 02/15/20 1357 02/16/20 0506  NA 139 141 142 143  --  141  --   K 3.9 4.2 3.9 3.6  --  3.8  --   CL 106 106 108 108  --  103  --   CO2 20* 21* 20* 22  --  23  --   GLUCOSE 108* 105* 205* 109*  --  143*  --   BUN 92* 98* 101* 57*  --  61*  --   CREATININE 4.57* 4.82* 5.06* 3.40*  --  3.76*  --   CALCIUM 8.4* 8.6* 8.7* 8.8*  --  8.5*  --   MG  --  1.9 2.0 1.7 1.9  --  1.7  PHOS  --  3.8 3.8 2.5  --  2.7  --    GFR: Estimated Creatinine Clearance: 17.4 mL/min (A) (by C-G formula based on SCr of 3.76 mg/dL (H)). Liver Function Tests: Recent Labs  Lab 02/10/20 0452 02/10/20 0452 02/11/20 0420 02/12/20 0439 02/13/20 0521 02/14/20 0722 02/15/20 1357  AST 11*  --  10*  --   --   --   --  ALT 14  --  13  --   --   --   --   ALKPHOS 95  --  108  --   --   --   --   BILITOT 2.1*  --  2.4*  --   --   --   --   PROT 5.7*  --  5.6*  --   --   --   --   ALBUMIN 2.5*   < > 2.3* 2.2* 2.3* 2.3* 2.2*   < > = values in this interval not displayed.   No results for input(s): LIPASE, AMYLASE in the last 168 hours. Recent Labs  Lab 02/12/20 0439 02/13/20 0521  AMMONIA 45* 42*   Coagulation Profile: No results for input(s): INR, PROTIME in the last 168 hours. Cardiac Enzymes: No results for input(s): CKTOTAL, CKMB, CKMBINDEX, TROPONINI in the last 168 hours. BNP (last 3 results) No results for input(s): PROBNP in the last 8760 hours. HbA1C: No results for input(s): HGBA1C in the last 72 hours. CBG: Recent Labs  Lab 02/15/20 2042 02/16/20 0006 02/16/20 0513 02/16/20 0722 02/16/20 1134  GLUCAP 109* 108* 132* 129* 141*   Lipid Profile: No results for input(s): CHOL, HDL, LDLCALC, TRIG, CHOLHDL, LDLDIRECT in the last 72 hours. Thyroid Function Tests: No results for input(s): TSH, T4TOTAL, FREET4, T3FREE, THYROIDAB in the last 72 hours. Anemia Panel: No results for input(s): VITAMINB12, FOLATE, FERRITIN, TIBC, IRON, RETICCTPCT in the last 72 hours. Sepsis Labs: No  results for input(s): PROCALCITON, LATICACIDVEN in the last 168 hours.  Recent Results (from the past 240 hour(s))  Blood culture (routine x 2)     Status: None   Collection Time: 02/09/20  1:25 AM   Specimen: BLOOD LEFT ARM  Result Value Ref Range Status   Specimen Description BLOOD LEFT ARM  Final   Special Requests   Final    BOTTLES DRAWN AEROBIC AND ANAEROBIC Blood Culture adequate volume   Culture   Final    NO GROWTH 5 DAYS Performed at [facility], [address], [place], [place] [address]    Report Status 02/14/2020 FINAL  Final  Respiratory Panel by RT PCR (Flu A&B, Covid) - Nasopharyngeal Swab     Status: None   Collection Time: 02/09/20  3:08 AM   Specimen: Nasopharyngeal Swab  Result Value Ref Range Status   SARS Coronavirus 2 by RT PCR NEGATIVE NEGATIVE Final    Comment: (NOTE) SARS-CoV-2 target nucleic acids are NOT DETECTED. The SARS-CoV-2 RNA is generally detectable in upper respiratoy specimens during the acute phase of infection. The lowest concentration of SARS-CoV-2 viral copies this assay can detect is 131 copies/mL. A negative result does not preclude SARS-Cov-2 infection and should not be used as the sole basis for treatment or other patient management decisions. A negative result may occur with  improper specimen collection/handling, submission of specimen other than nasopharyngeal swab, presence of viral mutation(s) within the areas targeted by this assay, and inadequate number of viral copies (<131 copies/mL). A negative result must be combined with clinical observations, patient history, and epidemiological information. The expected result is Negative. Fact Sheet for Patients:  PinkCheek.be Fact Sheet for Healthcare Providers:  GravelBags.it This test is not yet ap proved or cleared by the [place] FDA and  has been authorized for detection and/or diagnosis of SARS-CoV-2 by FDA  under an Emergency Use Authorization (EUA). This EUA will remain  in effect (meaning this test can be used) for  the duration of the COVID-19 declaration under Section 564(b)(1) of the Act, 21 U.S.C. section 360bbb-3(b)(1), unless the authorization is terminated or revoked sooner.    Influenza A by PCR NEGATIVE NEGATIVE Final   Influenza B by PCR NEGATIVE NEGATIVE Final    Comment: (NOTE) The Xpert Xpress SARS-CoV-2/FLU/RSV assay is intended as an aid in  the diagnosis of influenza from Nasopharyngeal swab specimens and  should not be used as a sole basis for treatment. Nasal washings and  aspirates are unacceptable for Xpert Xpress SARS-CoV-2/FLU/RSV  testing. Fact Sheet for Patients: PinkCheek.be Fact Sheet for Healthcare Providers: GravelBags.it This test is not yet approved or cleared by the [place] FDA and  has been authorized for detection and/or diagnosis of SARS-CoV-2 by  FDA under an Emergency Use Authorization (EUA). This EUA will remain  in effect (meaning this test can be used) for the duration of the  Covid-19 declaration under Section 564(b)(1) of the Act, 21  U.S.C. section 360bbb-3(b)(1), unless the authorization is  terminated or revoked. Performed at [facility], [place] [address] [place], [place] [address]   Blood culture (routine x 2)     Status: None   Collection Time: 02/09/20  3:15 AM   Specimen: BLOOD LEFT HAND  Result Value Ref Range Status   Specimen Description BLOOD LEFT HAND  Final   Special Requests   Final    BOTTLES DRAWN AEROBIC ONLY Blood Culture results may not be optimal due to an inadequate volume of blood received in culture bottles   Culture   Final    NO GROWTH 5 DAYS Performed at [facility], [place] [address] [place],  [address]    Report Status 02/14/2020 FINAL  Final         Radiology Studies: No results found.      Scheduled Meds: .  allopurinol  50 mg Oral Daily  . antiseptic oral rinse  15 mL Mouth Rinse BID  . atorvastatin  80 mg Oral q1800  . buPROPion  100 mg Oral TID  . carvedilol  25 mg Oral Q12H  . Chlorhexidine Gluconate Cloth  6 each Topical Daily  . Chlorhexidine Gluconate Cloth  6 each Topical Q0600  . clopidogrel  75 mg Oral Daily  . darbepoetin (ARANESP) injection - NON-DIALYSIS  60 mcg Subcutaneous Q Thu-1800  . doxycycline  100 mg Oral Q12H  . folic acid  1 mg Oral Daily  . insulin aspart  0-6 Units Subcutaneous TID WC  . lactulose  20 g Oral BID  . omega-3 acid ethyl esters  1 g Oral BID  . sodium chloride flush  3 mL Intravenous Q12H  . sodium chloride flush  3 mL Intravenous Q12H   Continuous Infusions: . sodium chloride Stopped (02/10/20 1409)  . sodium chloride    . sodium chloride    . ferumoxytol       LOS: 6 days   Time spent= 35 mins    Verdie Barrows Arsenio Loader, MD [facility]  If 7PM-7AM, please contact night-coverage  02/16/2020, 1:49 PM

## 2020-02-16 NOTE — Progress Notes (Signed)
Physical Therapy Treatment Patient Details Name: [name] MRN: [id number] DOB: 1951-11-09 Today's Date: 02/16/2020    History of Present Illness Pt is a 69 y/o female admitted 02/09/20 secondary to RLE cellulitis and volume overload. PMH includes CAD s/p stent, DM, CKD, CVA, dCHF, L BKA.    PT Comments    Patient seen for mobility progression. Pt is more awake/alert than previously noted sessions and agreeable to participate in therapy. Pt follows single step cues inconsistently and with increased time. Pt requires max-total A +2 for bed mobility and OOB transfer. Recommend nursing staff use hoyer lift for transfer back to bed and future other OOB transfers. PT will continue to follow acutely and to progress as tolerated.     Follow Up Recommendations  SNF;Supervision/Assistance - 24 hour     Equipment Recommendations  Hospital bed;Other (comment)(hoyer lift, amputee lift pad)    Recommendations for Other Services       Precautions / Restrictions Precautions Precautions: Fall;Other (comment) Precaution Comments: RLE unna boot (painful LE), L BKA at baseline Restrictions Weight Bearing Restrictions: No    Mobility  Bed Mobility Overal bed mobility: Needs Assistance Bed Mobility: Supine to Sit     Supine to sit: Max assist;HOB elevated     General bed mobility comments: multimodal cues for sequencing; hand over hand assist to reach for rail; assist with bed pad to bring hips and R LE to EOB and then to elevate trunk into sitting   Transfers Overall transfer level: Needs assistance Equipment used: (+2 face to face with bed pad) Transfers: Lateral/Scoot Transfers          Lateral/Scoot Transfers: Max assist;Total assist;+2 physical assistance General transfer comment: cues for maintaining trunk flexion and for sequencing; bed pad used to scoot hips and bilat LE blocked for safety  Ambulation/Gait                 Stairs             Wheelchair  Mobility    Modified Rankin (Stroke Patients Only)       Balance Overall balance assessment: Needs assistance   Sitting balance-Leahy Scale: Poor Sitting balance - Comments: pt requires varying levels of assistance to maintain sitting balance EOB; with cues pt is able to maintain briefly with min guard-min A  Postural control: Left lateral lean;Posterior lean                                  Cognition Arousal/Alertness: Awake/alert Behavior During Therapy: Flat affect Overall Cognitive Status: No family/caregiver present to determine baseline cognitive functioning                                 General Comments: very soft spoken and mostly mouthing words; pt follows single step cues inconsistently and with increased time      Exercises      General Comments General comments (skin integrity, edema, etc.): pt on 1L O2 via [place]      Pertinent Vitals/Pain Pain Assessment: Faces Faces Pain Scale: Hurts little more Pain Location: R LE with mobility Pain Descriptors / Indicators: Grimacing;Sore Pain Intervention(s): Monitored during session;Repositioned    Home Living                      Prior Function  PT Goals (current goals can now be found in the care plan section) Progress towards PT goals: Progressing toward goals    Frequency    Min 2X/week      PT Plan Current plan remains appropriate    Co-evaluation              AM-PAC PT "6 Clicks" Mobility   Outcome Measure  Help needed turning from your back to your side while in a flat bed without using bedrails?: Total Help needed moving from lying on your back to sitting on the side of a flat bed without using bedrails?: Total Help needed moving to and from a bed to a chair (including a wheelchair)?: Total Help needed standing up from a chair using your arms (e.g., wheelchair or bedside chair)?: Total Help needed to walk in hospital room?: Total Help  needed climbing 3-5 steps with a railing? : Total 6 Click Score: 6    End of Session   Activity Tolerance: Patient tolerated treatment well Patient left: with call bell/phone within reach;in chair;with chair alarm set Nurse Communication: Mobility status;Need for lift equipment;Patient requests pain meds PT Visit Diagnosis: Difficulty in walking, not elsewhere classified (R26.2);Muscle weakness (generalized) (M62.81)     Time: 6153-7943 PT Time Calculation (min) (ACUTE ONLY): 40 min  Charges:  $Therapeutic Activity: 23-37 mins                     Earney Navy, PTA Acute Rehabilitation Services Pager: [phone] Office: [phone]     Darliss Cheney 02/16/2020, 10:14 AM

## 2020-02-16 NOTE — Progress Notes (Signed)
Renal Navigator had made arrangements with patient's daughter to meet today between 2-4pm to complete [facility] application for patient, as daughter reports that patient is not capable of signing her own consents, but daughter never called as planned. Navigator called daughter, who states she forgot. Renal Navigator has arranged to meet with daughter tomorrow at 10:00am. Daughter agrees. Application was completed over the phone and signatures will be obtained tomorrow. Per daughter, patient will require a [place] (PCA) to travel with her mother while riding to transportation [place]. Renal Navigator notified [facility] Eligibility Director of this need and anticipates that this could cause delay in initiation of services.  Renal Navigator will follow closely.  Renal Navigator [phone]

## 2020-02-16 NOTE — Progress Notes (Signed)
[facility] ROUNDING NOTE   Subjective:   This is 69 year old lady with history of diabetes coronary artery disease CVA thrombocytopenia diastolic heart failure CKD stage V history of left upper arm extremity AV fistula placed 07/2019, PAD status post left BKA fluid overload and right lower extremity cellulitis admitted 02/09/2020.  Thought to have uremic encephalopathy contributing to lethargy.  Was initiated on hemodialysis.    dialysis #2   removal of 500 cc 02/15/2020  Blood pressure 121/48 pulse 67 temperature 99 O2 sats 100% 2 L nasal cannula  Sodium 141 potassium 3.8 chloride 103 CO2 23 BUN 61 creatinine 3.76 glucose 143 calcium 8.5 albumin 2.2 magnesium 1.7 WBC 11.5 hemoglobin 7.6 platelets 92  Allopurinol 50 mg daily atorvastatin 80 mg daily Wellbutrin 100 mg 3 times daily Coreg 25 mg every 12 hours Plavix 75 mg daily darbepoetin 60 mcg every Thursday doxycycline 100 mg every 12 hours, insulin sliding scale  Objective:  Vital signs in last 24 hours:  Temp:  [98.8 F (37.1 C)-99.2 F (37.3 C)] 99 F (37.2 C) (03/30 0532) Pulse Rate:  [66-90] 67 (03/30 0532) Resp:  [16-30] 18 (03/30 0532) BP: (121-155)/(48-94) 121/48 (03/30 0532) SpO2:  [94 %-100 %] 100 % (03/30 0532) Weight:  [116.6 kg-129.7 kg] 116.6 kg (03/30 0536)  Weight change: 3.629 kg Filed Weights   02/15/20 1319 02/15/20 1623 02/16/20 0536  Weight: 129.7 kg 129.3 kg 116.6 kg    Intake/Output: I/O last 3 completed shifts: In: 0  Out: 500 [Other:500]   Intake/Output this shift:  No intake/output data recorded.  Gen: Awake and comfortable nondistressed nonverbal CVS: Pulse regular rhythm, normal rate, S1 and S2 normal.  Right IJ hemodialysis catheter Resp: Poor inspiratory effort with decreased breath sounds over bases, no rales Abd: Soft, flat, nontender Ext: Right leg in Ace wrap, trace edema left ankle.  Short pulsatile right upper arm AVF   Basic Metabolic Panel: Recent Labs  Lab  02/11/20 0420 02/11/20 0420 02/12/20 0439 02/12/20 0439 02/13/20 0521 02/14/20 0722 02/15/20 0554 02/15/20 1357 02/16/20 0506  NA 139  --  141  --  142 143  --  141  --   K 3.9  --  4.2  --  3.9 3.6  --  3.8  --   CL 106  --  106  --  108 108  --  103  --   CO2 20*  --  21*  --  20* 22  --  23  --   GLUCOSE 108*  --  105*  --  205* 109*  --  143*  --   BUN 92*  --  98*  --  101* 57*  --  61*  --   CREATININE 4.57*  --  4.82*  --  5.06* 3.40*  --  3.76*  --   CALCIUM 8.4*   < > 8.6*   < > 8.7* 8.8*  --  8.5*  --   MG  --   --  1.9  --  2.0 1.7 1.9  --  1.7  PHOS  --   --  3.8  --  3.8 2.5  --  2.7  --    < > = values in this interval not displayed.    Liver Function Tests: Recent Labs  Lab 02/10/20 0452 02/10/20 0452 02/11/20 0420 02/12/20 0439 02/13/20 0521 02/14/20 0722 02/15/20 1357  AST 11*  --  10*  --   --   --   --  ALT 14  --  13  --   --   --   --   ALKPHOS 95  --  108  --   --   --   --   BILITOT 2.1*  --  2.4*  --   --   --   --   PROT 5.7*  --  5.6*  --   --   --   --   ALBUMIN 2.5*   < > 2.3* 2.2* 2.3* 2.3* 2.2*   < > = values in this interval not displayed.   No results for input(s): LIPASE, AMYLASE in the last 168 hours. Recent Labs  Lab 02/12/20 0439 02/13/20 0521  AMMONIA 45* 42*    CBC: Recent Labs  Lab 02/10/20 0452 02/11/20 0420 02/12/20 0439 02/13/20 0521 02/14/20 0722 02/15/20 0554 02/16/20 0506  WBC 6.9   < > 8.6 8.7 9.6 9.4 11.5*  NEUTROABS 5.5  --   --   --   --   --   --   HGB 8.1*   < > 7.9* 8.1* 8.4* 8.3* 7.6*  HCT 26.1*   < > 26.2* 26.8* 26.7* 27.2* 25.2*  MCV 101.2*   < > 103.6* 104.3* 100.4* 102.6* 102.0*  PLT 74*   < > 82* 86* 84* 96* 92*   < > = values in this interval not displayed.    Cardiac Enzymes: No results for input(s): CKTOTAL, CKMB, CKMBINDEX, TROPONINI in the last 168 hours.  BNP: Invalid input(s): POCBNP  CBG: Recent Labs  Lab 02/15/20 1705 02/15/20 2042 02/16/20 0006 02/16/20 0513  02/16/20 0722  GLUCAP 102* 109* 108* 132* 129*    Microbiology: Results for orders placed or performed during the hospital encounter of 02/09/20  Blood culture (routine x 2)     Status: None   Collection Time: 02/09/20  1:25 AM   Specimen: BLOOD LEFT ARM  Result Value Ref Range Status   Specimen Description BLOOD LEFT ARM  Final   Special Requests   Final    BOTTLES DRAWN AEROBIC AND ANAEROBIC Blood Culture adequate volume   Culture   Final    NO GROWTH 5 DAYS Performed at [facility], [place] [address], [place], [place] [address]    Report Status 02/14/2020 FINAL  Final  Respiratory Panel by RT PCR (Flu A&B, Covid) - Nasopharyngeal Swab     Status: None   Collection Time: 02/09/20  3:08 AM   Specimen: Nasopharyngeal Swab  Result Value Ref Range Status   SARS Coronavirus 2 by RT PCR NEGATIVE NEGATIVE Final    Comment: (NOTE) SARS-CoV-2 target nucleic acids are NOT DETECTED. The SARS-CoV-2 RNA is generally detectable in upper respiratoy specimens during the acute phase of infection. The lowest concentration of SARS-CoV-2 viral copies this assay can detect is 131 copies/mL. A negative result does not preclude SARS-Cov-2 infection and should not be used as the sole basis for treatment or other patient management decisions. A negative result may occur with  improper specimen collection/handling, submission of specimen other than nasopharyngeal swab, presence of viral mutation(s) within the areas targeted by this assay, and inadequate number of viral copies (<131 copies/mL). A negative result must be combined with clinical observations, patient history, and epidemiological information. The expected result is Negative. Fact Sheet for Patients:  PinkCheek.be Fact Sheet for Healthcare Providers:  GravelBags.it This test is not yet ap proved or cleared by the [place] FDA and  has been authorized for detection and/or  diagnosis of SARS-CoV-2 by FDA under an Emergency Use Authorization (EUA). This EUA will remain  in effect (meaning this test can be used) for the duration of the COVID-19 declaration under Section 564(b)(1) of the Act, 21 U.S.C. section 360bbb-3(b)(1), unless the authorization is terminated or revoked sooner.    Influenza A by PCR NEGATIVE NEGATIVE Final   Influenza B by PCR NEGATIVE NEGATIVE Final    Comment: (NOTE) The Xpert Xpress SARS-CoV-2/FLU/RSV assay is intended as an aid in  the diagnosis of influenza from Nasopharyngeal swab specimens and  should not be used as a sole basis for treatment. Nasal washings and  aspirates are unacceptable for Xpert Xpress SARS-CoV-2/FLU/RSV  testing. Fact Sheet for Patients: PinkCheek.be Fact Sheet for Healthcare Providers: GravelBags.it This test is not yet approved or cleared by the [place] FDA and  has been authorized for detection and/or diagnosis of SARS-CoV-2 by  FDA under an Emergency Use Authorization (EUA). This EUA will remain  in effect (meaning this test can be used) for the duration of the  Covid-19 declaration under Section 564(b)(1) of the Act, 21  U.S.C. section 360bbb-3(b)(1), unless the authorization is  terminated or revoked. Performed at [facility], [place] [address] [place], [place] [address]   Blood culture (routine x 2)     Status: None   Collection Time: 02/09/20  3:15 AM   Specimen: BLOOD LEFT HAND  Result Value Ref Range Status   Specimen Description BLOOD LEFT HAND  Final   Special Requests   Final    BOTTLES DRAWN AEROBIC ONLY Blood Culture results may not be optimal due to an inadequate volume of blood received in culture bottles   Culture   Final    NO GROWTH 5 DAYS Performed at [facility], [place] [address] [place], [place] [address]    Report Status 02/14/2020 FINAL  Final    Coagulation Studies: No results for input(s):  LABPROT, INR in the last 72 hours.  Urinalysis: No results for input(s): COLORURINE, LABSPEC, PHURINE, GLUCOSEU, HGBUR, BILIRUBINUR, KETONESUR, PROTEINUR, UROBILINOGEN, NITRITE, LEUKOCYTESUR in the last 72 hours.  Invalid input(s): APPERANCEUR    Imaging: No results found.   Medications:   . sodium chloride Stopped (02/10/20 1409)  . ferumoxytol     . allopurinol  50 mg Oral Daily  . antiseptic oral rinse  15 mL Mouth Rinse BID  . atorvastatin  80 mg Oral q1800  . buPROPion  100 mg Oral TID  . carvedilol  25 mg Oral Q12H  . Chlorhexidine Gluconate Cloth  6 each Topical Daily  . clopidogrel  75 mg Oral Daily  . darbepoetin (ARANESP) injection - NON-DIALYSIS  60 mcg Subcutaneous Q Thu-1800  . doxycycline  100 mg Oral Q12H  . folic acid  1 mg Oral Daily  . insulin aspart  0-6 Units Subcutaneous TID WC  . lactulose  20 g Oral BID  . omega-3 acid ethyl esters  1 g Oral BID  . sodium chloride flush  3 mL Intravenous Q12H  . sodium chloride flush  3 mL Intravenous Q12H   sodium chloride, acetaminophen **OR** acetaminophen, heparin, ondansetron **OR** ondansetron (ZOFRAN) IV, Resource ThickenUp Clear, sodium chloride flush  Assessment/ Plan:  1. Acute kidney Injury on chronic kidney disease stage V: She remains lethargic and nonoliguric overnight even after hemodialysis yesterday (done for presumption that this was uremic encephalopathy).  Started on dialysis via temporary IJ dialysis catheter placed by IR after RUA AVF deemed unusable; will  schedule her next dialysis 02/15/2020.  She will need conversion to [facility] and placement of permanent access at some point.  Patient continues to be lethargic we will schedule dialysis for 02/16/2020.  Not showing much signs of improvement 2.  Right lower extremity cellulitis: Clinically improving on oral doxycycline.  With [employer]. 3.  History of congestive heart failure with diastolic dysfunction: Currently appears to be compensated with dialysis  started yesterday.  Leave off of diuretics and continue to monitor on dialysis. 4.  Anemia: Downtrending hemoglobin and hematocrit, with iron deficiency and on ESA. 5.  Secondary hyperparathyroidism: Calcium and phosphorus levels currently at goal, poor oral intake and not on binders   LOS: 6 Sherril Croon @TODAY @8 :31 AM

## 2020-02-16 NOTE — Care Management Important Message (Signed)
Important Message  Patient Details  Name: [name] MRN: [id number] Date of Birth: 10/11/1951   Medicare Important Message Given:  Yes     [name] 02/16/2020, 10:03 AM

## 2020-02-16 NOTE — Progress Notes (Signed)
Back from HD,pt verbalized "I'm hungry, ate 2 cups of pudding, tolerated well.

## 2020-02-16 NOTE — Progress Notes (Signed)
Hemodialysis initiated via R Chest HD catheter without issue. 300/600 with uf goal 1L as ordered. Patient is alert to self, sleepy. 4k bath. Continue to monitor.

## 2020-02-17 LAB — GLUCOSE, CAPILLARY
Glucose-Capillary: 129 mg/dL — ABNORMAL HIGH (ref 70–99)
Glucose-Capillary: 144 mg/dL — ABNORMAL HIGH (ref 70–99)
Glucose-Capillary: 154 mg/dL — ABNORMAL HIGH (ref 70–99)
Glucose-Capillary: 157 mg/dL — ABNORMAL HIGH (ref 70–99)
Glucose-Capillary: 162 mg/dL — ABNORMAL HIGH (ref 70–99)
Glucose-Capillary: 184 mg/dL — ABNORMAL HIGH (ref 70–99)

## 2020-02-17 LAB — CBC
HCT: 24.4 % — ABNORMAL LOW (ref 36.0–46.0)
Hemoglobin: 7.3 g/dL — ABNORMAL LOW (ref 12.0–15.0)
MCH: 30.7 pg (ref 26.0–34.0)
MCHC: 29.9 g/dL — ABNORMAL LOW (ref 30.0–36.0)
MCV: 102.5 fL — ABNORMAL HIGH (ref 80.0–100.0)
Platelets: 84 10*3/uL — ABNORMAL LOW (ref 150–400)
RBC: 2.38 MIL/uL — ABNORMAL LOW (ref 3.87–5.11)
RDW: 14.8 % (ref 11.5–15.5)
WBC: 11.5 10*3/uL — ABNORMAL HIGH (ref 4.0–10.5)
nRBC: 0 % (ref 0.0–0.2)

## 2020-02-17 LAB — COMPREHENSIVE METABOLIC PANEL
ALT: 19 U/L (ref 0–44)
AST: 14 U/L — ABNORMAL LOW (ref 15–41)
Albumin: 2 g/dL — ABNORMAL LOW (ref 3.5–5.0)
Alkaline Phosphatase: 109 U/L (ref 38–126)
Anion gap: 9 (ref 5–15)
BUN: 26 mg/dL — ABNORMAL HIGH (ref 8–23)
CO2: 26 mmol/L (ref 22–32)
Calcium: 8.2 mg/dL — ABNORMAL LOW (ref 8.9–10.3)
Chloride: 100 mmol/L (ref 98–111)
Creatinine, Ser: 2.68 mg/dL — ABNORMAL HIGH (ref 0.44–1.00)
GFR calc Af Amer: 20 mL/min — ABNORMAL LOW (ref >60)
GFR calc non Af Amer: 18 mL/min — ABNORMAL LOW (ref >60)
Glucose, Bld: 170 mg/dL — ABNORMAL HIGH (ref 70–99)
Potassium: 3.6 mmol/L (ref 3.5–5.1)
Sodium: 135 mmol/L (ref 135–145)
Total Bilirubin: 1.6 mg/dL — ABNORMAL HIGH (ref 0.3–1.2)
Total Protein: 5.4 g/dL — ABNORMAL LOW (ref 6.5–8.1)

## 2020-02-17 LAB — VITAMIN B12: Vitamin B-12: 1216 pg/mL — ABNORMAL HIGH (ref 180–914)

## 2020-02-17 LAB — AMMONIA: Ammonia: 19 umol/L (ref 9–35)

## 2020-02-17 MED ORDER — CHLORHEXIDINE GLUCONATE CLOTH 2 % EX PADS
6.0000 | MEDICATED_PAD | Freq: Every day | CUTANEOUS | Status: DC
  Administered 2020-02-17 – 2020-02-19 (×3): 6 via TOPICAL

## 2020-02-17 MED ORDER — DOXYCYCLINE HYCLATE 100 MG PO TABS
100.0000 mg | ORAL_TABLET | Freq: Two times a day (BID) | ORAL | Status: AC
  Administered 2020-02-17: 100 mg via ORAL
  Filled 2020-02-17: qty 1

## 2020-02-17 MED ORDER — INSULIN ASPART 100 UNIT/ML ~~LOC~~ SOLN
0.0000 [IU] | Freq: Three times a day (TID) | SUBCUTANEOUS | Status: DC
  Administered 2020-02-17 – 2020-02-19 (×5): 1 [IU] via SUBCUTANEOUS
  Administered 2020-02-20: 2 [IU] via SUBCUTANEOUS
  Administered 2020-02-21: 3 [IU] via SUBCUTANEOUS
  Administered 2020-02-21: 1 [IU] via SUBCUTANEOUS
  Administered 2020-02-21: 4 [IU] via SUBCUTANEOUS
  Administered 2020-02-22 – 2020-02-23 (×5): 2 [IU] via SUBCUTANEOUS
  Administered 2020-02-24 (×2): 1 [IU] via SUBCUTANEOUS
  Administered 2020-02-24: 2 [IU] via SUBCUTANEOUS
  Administered 2020-02-25 (×3): 1 [IU] via SUBCUTANEOUS

## 2020-02-17 NOTE — Progress Notes (Signed)
PROGRESS NOTE    [name]  XQJ:[id number] DOB: Jun 28, 1951 DOA: 02/09/2020 PCP: Katherina Mires, MD   Brief Narrative:  69 year old female with history of DM-2, CAD s/p BMS to RCA, CVA, thrombocytopenia, diastolic CHF, CKD-5 with LUE aVF placed in 07/2019, PAD s/p left BKA presenting with progressive redness and edema of right lower extremity, and admitted for right lower extremity cellulitis, fluid overload and acute on chronic diastolic CHF.  Patient was started on IV vancomycin for cellulitis.    Developed AKI therefore nephrology team consulted initially started on Lasix and gentle hydration.  Renal function failed to improve therefore started on hemodialysis on 3/27.  Currently has right IJ temporary dialysis catheter in place.   Assessment & Plan:   Principal Problem:   Acute on chronic diastolic CHF (congestive heart failure) (HCC) Active Problems:   Acute renal failure superimposed on stage 4 chronic kidney disease (HCC)   Type 2 diabetes mellitus with renal manifestations (HCC)   CAD S/P PCI TO RCA for Inferior STEMI. BMS   CKD (chronic kidney disease), stage IV (HCC)   Anemia in chronic kidney disease   CVA (cerebral vascular accident) ([place])   AKI (acute kidney injury) ([place])   Cellulitis of right leg   Thrombocytopenia (HCC)   Hyperbilirubinemia   Xerostomia   Advanced care planning/counseling discussion   Full code status   Dry skin   Physical debility   Palliative care by specialist   Goals of care, counseling/discussion  Acute kidney injury on CKD stage V -Patient is now on dialysis getting it via temporary dialysis catheter.  Plans to have permanent access later in time.  Nephrology team is following.  Had AV fistula placed in right upper extremity 9/20 but not ready to be used yet.  Ongoing discussions with nephrology team. -BUN and creatinine improving.  Acute on chronic diastolic congestive heart failure, ejection fraction 65% -Improving with  dialysis.  Right lower extremity cellulitis -Improving.  On doxycycline (3/25> ) and right [place] boot  Acute metabolic encephalopathy -Combination of uremia/hyperammonia.  No focal neuro deficits.  CT of the head is negative.  TSH, B12 are normal -Lactulose twice daily; ammonia levels have down trended down -Continue supportive care, avoid sedative medication.  Diabetes mellitus type 2 controlled -Hemoglobin A1c 6.0.  Continue current regimen.  Normocytic anemia Mild folate deficiency -Continue folic acid, insulin sliding scale and Accu-Cheks  History of peripheral arterial disease status post left-sided BKA -Continue Plavix, Lipitor and Zetia  History of CVA/right upper extremity weakness -Continue home meds  Coronary artery disease status post bare-metal stent to RCA in 2010 -Continue home cardiac meds  Lack of appetite/poor oral intake Mild protein calorie malnutrition -Mirtazapine started.  Encourage p.o. intake.  Goals of care discussion-seen by palliative care team recommending full scope of medical treatment at this time.  Patient recovery would be challenging especially if her p.o. intake remains very minimal due to lack of desire to eat.  DVT prophylaxis: None secondary to thrombocytopenia Code Status: Full code Family Communication: Spoke with the daughter yesterday evening, plan on speaking to her later on again today. Disposition Plan:   Patient From= home  Patient Anticipated D/C place= PT recommending SNF  Barriers= currently awaiting nephrology clearance and her mentation to improve so she can transition to SNF to participate safely with therapy.  Subjective: Laying in the bed this morning, seems slightly more awake to me and answers basic questions.  She denies any complaints, overall tells me she feels okay.  Tolerated her dialysis yesterday.  Review of Systems Otherwise negative except as per HPI, including: General = no fevers, chills, dizziness,   fatigue HEENT/EYES = negative for loss of vision, double vision, blurred vision,  sore throa Cardiovascular= negative for chest pain, palpitation Respiratory/lungs= negative for shortness of breath, cough, wheezing; hemoptysis,  Gastrointestinal= negative for nausea, vomiting, abdominal pain Genitourinary= negative for Dysuria MSK = Negative for arthralgia, myalgias Neurology= Negative for headache, numbness, tingling  Psychiatry= Negative for suicidal and homocidal ideation Skin= Negative for Rash  Examination:  Constitutional: Not in acute distress Respiratory: Clear to auscultation bilaterally Cardiovascular: Normal sinus rhythm, no rubs Abdomen: Nontender nondistended good bowel sounds Musculoskeletal: Left-sided BKA noted Skin: Right lower extremity has dressing in place.  No evidence of erythema or cellulitis beyond dressing Neurologic: CN 2-12 grossly intact.  And nonfocal Psychiatric: Poor judgment and insight.  Awake alert oriented to name and place    Objective: Vitals:   02/16/20 2031 02/17/20 0518 02/17/20 0528 02/17/20 1148  BP: (!) 137/48  (!) 122/48 (!) 144/57  Pulse: 62  72 67  Resp: 18  18   Temp: 97.7 F (36.5 C)  98.7 F (37.1 C) (!) 97.3 F (36.3 C)  TempSrc: Oral   Oral  SpO2: 99%  99% 97%  Weight:  125.6 kg    Height:        Intake/Output Summary (Last 24 hours) at 02/17/2020 1155 Last data filed at 02/17/2020 0900 Gross per 24 hour  Intake 270 ml  Output 1000 ml  Net -730 ml   Filed Weights   02/16/20 1300 02/16/20 1610 02/17/20 0518  Weight: 116.9 kg 125 kg 125.6 kg     Data Reviewed:   CBC: Recent Labs  Lab 02/14/20 0722 02/15/20 0554 02/16/20 0506 02/16/20 1230 02/17/20 0443  WBC 9.6 9.4 11.5* 11.3* 11.5*  HGB 8.4* 8.3* 7.6* 7.5* 7.3*  HCT 26.7* 27.2* 25.2* 25.1* 24.4*  MCV 100.4* 102.6* 102.0* 103.3* 102.5*  PLT 84* 96* 92* 93* 84*   Basic Metabolic Panel: Recent Labs  Lab 02/12/20 0439 02/13/20 0521 02/14/20 0722  02/15/20 0554 02/15/20 1357 02/16/20 0506 02/17/20 0443  NA 141 142 143  --  141  --  135  K 4.2 3.9 3.6  --  3.8  --  3.6  CL 106 108 108  --  103  --  100  CO2 21* 20* 22  --  23  --  26  GLUCOSE 105* 205* 109*  --  143*  --  170*  BUN 98* 101* 57*  --  61*  --  26*  CREATININE 4.82* 5.06* 3.40*  --  3.76*  --  2.68*  CALCIUM 8.6* 8.7* 8.8*  --  8.5*  --  8.2*  MG 1.9 2.0 1.7 1.9  --  1.7  --   PHOS 3.8 3.8 2.5  --  2.7  --   --    GFR: Estimated Creatinine Clearance: 25.5 mL/min (A) (by C-G formula based on SCr of 2.68 mg/dL (H)). Liver Function Tests: Recent Labs  Lab 02/11/20 0420 02/11/20 0420 02/12/20 0439 02/13/20 0521 02/14/20 0722 02/15/20 1357 02/17/20 0443  AST 10*  --   --   --   --   --  14*  ALT 13  --   --   --   --   --  19  ALKPHOS 108  --   --   --   --   --  109  BILITOT 2.4*  --   --   --   --   --  1.6*  PROT 5.6*  --   --   --   --   --  5.4*  ALBUMIN 2.3*   < > 2.2* 2.3* 2.3* 2.2* 2.0*   < > = values in this interval not displayed.   No results for input(s): LIPASE, AMYLASE in the last 168 hours. Recent Labs  Lab 02/12/20 0439 02/13/20 0521 02/17/20 0443  AMMONIA 45* 42* 19   Coagulation Profile: No results for input(s): INR, PROTIME in the last 168 hours. Cardiac Enzymes: No results for input(s): CKTOTAL, CKMB, CKMBINDEX, TROPONINI in the last 168 hours. BNP (last 3 results) No results for input(s): PROBNP in the last 8760 hours. HbA1C: No results for input(s): HGBA1C in the last 72 hours. CBG: Recent Labs  Lab 02/16/20 2027 02/17/20 0000 02/17/20 0437 02/17/20 0755 02/17/20 1151  GLUCAP 197* 184* 162* 154* 144*   Lipid Profile: No results for input(s): CHOL, HDL, LDLCALC, TRIG, CHOLHDL, LDLDIRECT in the last 72 hours. Thyroid Function Tests: No results for input(s): TSH, T4TOTAL, FREET4, T3FREE, THYROIDAB in the last 72 hours. Anemia Panel: No results for input(s): VITAMINB12, FOLATE, FERRITIN, TIBC, IRON, RETICCTPCT in the  last 72 hours. Sepsis Labs: No results for input(s): PROCALCITON, LATICACIDVEN in the last 168 hours.  Recent Results (from the past 240 hour(s))  Blood culture (routine x 2)     Status: None   Collection Time: 02/09/20  1:25 AM   Specimen: BLOOD LEFT ARM  Result Value Ref Range Status   Specimen Description BLOOD LEFT ARM  Final   Special Requests   Final    BOTTLES DRAWN AEROBIC AND ANAEROBIC Blood Culture adequate volume   Culture   Final    NO GROWTH 5 DAYS Performed at [facility], [address] [place], [place] [address]    Report Status 02/14/2020 FINAL  Final  Respiratory Panel by RT PCR (Flu A&B, Covid) - Nasopharyngeal Swab     Status: None   Collection Time: 02/09/20  3:08 AM   Specimen: Nasopharyngeal Swab  Result Value Ref Range Status   SARS Coronavirus 2 by RT PCR NEGATIVE NEGATIVE Final    Comment: (NOTE) SARS-CoV-2 target nucleic acids are NOT DETECTED. The SARS-CoV-2 RNA is generally detectable in upper respiratoy specimens during the acute phase of infection. The lowest concentration of SARS-CoV-2 viral copies this assay can detect is 131 copies/mL. A negative result does not preclude SARS-Cov-2 infection and should not be used as the sole basis for treatment or other patient management decisions. A negative result may occur with  improper specimen collection/handling, submission of specimen other than nasopharyngeal swab, presence of viral mutation(s) within the areas targeted by this assay, and inadequate number of viral copies (<131 copies/mL). A negative result must be combined with clinical observations, patient history, and epidemiological information. The expected result is Negative. Fact Sheet for Patients:  PinkCheek.be Fact Sheet for Healthcare Providers:  GravelBags.it This test is not yet ap proved or cleared by the [place] FDA and  has been authorized for detection and/or  diagnosis of SARS-CoV-2 by FDA under an Emergency Use Authorization (EUA). This EUA will remain  in effect (meaning this test can be used) for the duration of the COVID-19 declaration under Section 564(b)(1) of the Act, 21 U.S.C. section 360bbb-3(b)(1), unless the authorization is terminated or revoked sooner.    Influenza A by PCR NEGATIVE NEGATIVE Final   Influenza B by PCR NEGATIVE NEGATIVE Final    Comment: (NOTE) The Xpert Xpress SARS-CoV-2/FLU/RSV  assay is intended as an aid in  the diagnosis of influenza from Nasopharyngeal swab specimens and  should not be used as a sole basis for treatment. Nasal washings and  aspirates are unacceptable for Xpert Xpress SARS-CoV-2/FLU/RSV  testing. Fact Sheet for Patients: PinkCheek.be Fact Sheet for Healthcare Providers: GravelBags.it This test is not yet approved or cleared by the [place] FDA and  has been authorized for detection and/or diagnosis of SARS-CoV-2 by  FDA under an Emergency Use Authorization (EUA). This EUA will remain  in effect (meaning this test can be used) for the duration of the  Covid-19 declaration under Section 564(b)(1) of the Act, 21  U.S.C. section 360bbb-3(b)(1), unless the authorization is  terminated or revoked. Performed at [facility], [place] [address], [place], [place] [address]   Blood culture (routine x 2)     Status: None   Collection Time: 02/09/20  3:15 AM   Specimen: BLOOD LEFT HAND  Result Value Ref Range Status   Specimen Description BLOOD LEFT HAND  Final   Special Requests   Final    BOTTLES DRAWN AEROBIC ONLY Blood Culture results may not be optimal due to an inadequate volume of blood received in culture bottles   Culture   Final    NO GROWTH 5 DAYS Performed at [facility], [place] [address] [place], [place] [address]    Report Status 02/14/2020 FINAL  Final         Radiology Studies: No results  found.      Scheduled Meds: . allopurinol  50 mg Oral Daily  . antiseptic oral rinse  15 mL Mouth Rinse BID  . atorvastatin  80 mg Oral q1800  . buPROPion  100 mg Oral TID  . carvedilol  25 mg Oral Q12H  . Chlorhexidine Gluconate Cloth  6 each Topical Daily  . Chlorhexidine Gluconate Cloth  6 each Topical Q0600  . clopidogrel  75 mg Oral Daily  . darbepoetin (ARANESP) injection - NON-DIALYSIS  60 mcg Subcutaneous Q Thu-1800  . doxycycline  100 mg Oral Q12H  . folic acid  1 mg Oral Daily  . insulin aspart  0-6 Units Subcutaneous TID WC  . lactulose  20 g Oral BID  . mirtazapine  7.5 mg Oral QHS  . omega-3 acid ethyl esters  1 g Oral BID  . sodium chloride flush  3 mL Intravenous Q12H  . sodium chloride flush  3 mL Intravenous Q12H   Continuous Infusions: . sodium chloride Stopped (02/10/20 1409)  . ferumoxytol       LOS: 7 days   Time spent= 35 mins    Kayte Borchard Arsenio Loader, MD [facility]  If 7PM-7AM, please contact night-coverage  02/17/2020, 11:55 AM

## 2020-02-17 NOTE — Progress Notes (Signed)
Renal Navigator received call that [name] and Dashawn/patient's daughters are here in patient's room. Renal Navigator met with daughters to obtain signatures on [facility] [place] [facility] [employer] application. Application submitted (to include request for Personal Care Attendant to ride with patient). Navigator asked for brief information regarding patient's baseline, as today she is slumped over in bed and not interactive at all. Dashawn attempted to put a spoonful of food in to her mouth and she did not respond. [name] informed me that prior to this hospitalization, her mother was wheelchair bound, and often leaned to one side or the other, but was able to carry on a conversation. They report that she was able to converse some after dialysis yesterday, but is now back to not being able to wake up again. They are understanding that she cannot be discharged in this condition to receive OP HD treatment. We will continue to make plans in the hope that she will improve.  Navigator spoke with Nephrologist/Dr. Justin Mend, Attending/Dr. Reesa Chew and PMT. Renal Navigator has not received acceptance for patient at the OP HD clinic and sent update to [facility] of understanding that acceptance is contingent upon her improvement prior to discharge.  Renal Navigator will continue to follow.  Alphonzo Cruise, [place] Renal Navigator [phone]

## 2020-02-17 NOTE — Progress Notes (Signed)
Renal Navigator and patient's daughter arranged to meet at 10am today to complete and sign [facility] [place] application for transportation to HD, but she has not called. Navigator called patient's daughter, who states she is waiting for her Melburn Popper and is on her way to the hospital and will call when she arrives. Navigator will await call.  Alphonzo Cruise, [place] Renal Navigator [phone]

## 2020-02-17 NOTE — Progress Notes (Signed)
[facility] ROUNDING NOTE   Subjective:   This is 69 year old lady with history of diabetes coronary artery disease CVA thrombocytopenia diastolic heart failure CKD stage V, now end-stage renal disease on dialysis.  History of left upper arm extremity AV fistula placed 07/2019, PAD status post left BKA fluid overload and right lower extremity cellulitis admitted 02/09/2020.  Thought to have uremic encephalopathy contributing to lethargy.  Was initiated on hemodialysis.    dialysis #3 1 L removed 02/16/2020  Blood pressure 122/48 pulse 72 temperature 98.7 O2 sats 99% nasal cannula  Sodium 135 potassium 3.6 chloride 100 CO2 26 BUN 26 creatinine 2.68 glucose 170 calcium 8.2 albumin 2.0 ammonia 19 total bilirubin 1.6 hemoglobin 7.3 WBC 11.5 platelets 84  Allopurinol 50 mg daily atorvastatin 80 mg daily Wellbutrin 100 mg 3 times daily Coreg 25 mg every 12 hours Plavix 75 mg daily darbepoetin 60 mcg every Thursday doxycycline 100 mg every 12 hours, insulin sliding scale  Objective:  Vital signs in last 24 hours:  Temp:  [97.6 F (36.4 C)-98.7 F (37.1 C)] 98.7 F (37.1 C) (03/31 0528) Pulse Rate:  [61-72] 72 (03/31 0528) Resp:  [15-18] 18 (03/31 0528) BP: (122-157)/(37-91) 122/48 (03/31 0528) SpO2:  [97 %-100 %] 99 % (03/31 0528) Weight:  [116.9 kg-125.6 kg] 125.6 kg (03/31 0518)  Weight change: -12.8 kg Filed Weights   02/16/20 1300 02/16/20 1610 02/17/20 0518  Weight: 116.9 kg 125 kg 125.6 kg    Intake/Output: I/O last 3 completed shifts: In: 360 [P.O.:360] Out: 1000 [Other:1000]   Intake/Output this shift:  No intake/output data recorded.  Gen: Awake and comfortable nondistressed nonverbal CVS: Pulse regular rhythm, normal rate, S1 and S2 normal.  Right IJ hemodialysis catheter Resp: Poor inspiratory effort with decreased breath sounds over bases, no rales Abd: Soft, flat, nontender Ext: Right leg in Ace wrap, trace edema left ankle.  Short pulsatile right upper  arm AVF   Basic Metabolic Panel: Recent Labs  Lab 02/12/20 0439 02/12/20 0439 02/13/20 0521 02/13/20 0521 02/14/20 0722 02/15/20 0554 02/15/20 1357 02/16/20 0506 02/17/20 0443  NA 141  --  142  --  143  --  141  --  135  K 4.2  --  3.9  --  3.6  --  3.8  --  3.6  CL 106  --  108  --  108  --  103  --  100  CO2 21*  --  20*  --  22  --  23  --  26  GLUCOSE 105*  --  205*  --  109*  --  143*  --  170*  BUN 98*  --  101*  --  57*  --  61*  --  26*  CREATININE 4.82*  --  5.06*  --  3.40*  --  3.76*  --  2.68*  CALCIUM 8.6*   < > 8.7*   < > 8.8*  --  8.5*  --  8.2*  MG 1.9  --  2.0  --  1.7 1.9  --  1.7  --   PHOS 3.8  --  3.8  --  2.5  --  2.7  --   --    < > = values in this interval not displayed.    Liver Function Tests: Recent Labs  Lab 02/11/20 0420 02/11/20 0420 02/12/20 0439 02/13/20 0521 02/14/20 0722 02/15/20 1357 02/17/20 0443  AST 10*  --   --   --   --   --  14*  ALT 13  --   --   --   --   --  19  ALKPHOS 108  --   --   --   --   --  109  BILITOT 2.4*  --   --   --   --   --  1.6*  PROT 5.6*  --   --   --   --   --  5.4*  ALBUMIN 2.3*   < > 2.2* 2.3* 2.3* 2.2* 2.0*   < > = values in this interval not displayed.   No results for input(s): LIPASE, AMYLASE in the last 168 hours. Recent Labs  Lab 02/12/20 0439 02/13/20 0521 02/17/20 0443  AMMONIA 45* 42* 19    CBC: Recent Labs  Lab 02/14/20 0722 02/15/20 0554 02/16/20 0506 02/16/20 1230 02/17/20 0443  WBC 9.6 9.4 11.5* 11.3* 11.5*  HGB 8.4* 8.3* 7.6* 7.5* 7.3*  HCT 26.7* 27.2* 25.2* 25.1* 24.4*  MCV 100.4* 102.6* 102.0* 103.3* 102.5*  PLT 84* 96* 92* 93* 84*    Cardiac Enzymes: No results for input(s): CKTOTAL, CKMB, CKMBINDEX, TROPONINI in the last 168 hours.  BNP: Invalid input(s): POCBNP  CBG: Recent Labs  Lab 02/16/20 1134 02/16/20 1653 02/16/20 2027 02/17/20 0000 02/17/20 0437  GLUCAP 141* 107* 197* 184* 162*    Microbiology: Results for orders placed or performed  during the hospital encounter of 02/09/20  Blood culture (routine x 2)     Status: None   Collection Time: 02/09/20  1:25 AM   Specimen: BLOOD LEFT ARM  Result Value Ref Range Status   Specimen Description BLOOD LEFT ARM  Final   Special Requests   Final    BOTTLES DRAWN AEROBIC AND ANAEROBIC Blood Culture adequate volume   Culture   Final    NO GROWTH 5 DAYS Performed at [facility], [place] [address] [place], [place] [address]    Report Status 02/14/2020 FINAL  Final  Respiratory Panel by RT PCR (Flu A&B, Covid) - Nasopharyngeal Swab     Status: None   Collection Time: 02/09/20  3:08 AM   Specimen: Nasopharyngeal Swab  Result Value Ref Range Status   SARS Coronavirus 2 by RT PCR NEGATIVE NEGATIVE Final    Comment: (NOTE) SARS-CoV-2 target nucleic acids are NOT DETECTED. The SARS-CoV-2 RNA is generally detectable in upper respiratoy specimens during the acute phase of infection. The lowest concentration of SARS-CoV-2 viral copies this assay can detect is 131 copies/mL. A negative result does not preclude SARS-Cov-2 infection and should not be used as the sole basis for treatment or other patient management decisions. A negative result may occur with  improper specimen collection/handling, submission of specimen other than nasopharyngeal swab, presence of viral mutation(s) within the areas targeted by this assay, and inadequate number of viral copies (<131 copies/mL). A negative result must be combined with clinical observations, patient history, and epidemiological information. The expected result is Negative. Fact Sheet for Patients:  PinkCheek.be Fact Sheet for Healthcare Providers:  GravelBags.it This test is not yet ap proved or cleared by the [place] FDA and  has been authorized for detection and/or diagnosis of SARS-CoV-2 by FDA under an Emergency Use Authorization (EUA). This EUA will remain  in effect  (meaning this test can be used) for the duration of the COVID-19 declaration under Section 564(b)(1) of the Act, 21 U.S.C. section 360bbb-3(b)(1), unless the authorization is terminated or revoked sooner.    Influenza A by PCR  NEGATIVE NEGATIVE Final   Influenza B by PCR NEGATIVE NEGATIVE Final    Comment: (NOTE) The Xpert Xpress SARS-CoV-2/FLU/RSV assay is intended as an aid in  the diagnosis of influenza from Nasopharyngeal swab specimens and  should not be used as a sole basis for treatment. Nasal washings and  aspirates are unacceptable for Xpert Xpress SARS-CoV-2/FLU/RSV  testing. Fact Sheet for Patients: PinkCheek.be Fact Sheet for Healthcare Providers: GravelBags.it This test is not yet approved or cleared by the [place] FDA and  has been authorized for detection and/or diagnosis of SARS-CoV-2 by  FDA under an Emergency Use Authorization (EUA). This EUA will remain  in effect (meaning this test can be used) for the duration of the  Covid-19 declaration under Section 564(b)(1) of the Act, 21  U.S.C. section 360bbb-3(b)(1), unless the authorization is  terminated or revoked. Performed at [facility], [place] [address] [place], [place] [address]   Blood culture (routine x 2)     Status: None   Collection Time: 02/09/20  3:15 AM   Specimen: BLOOD LEFT HAND  Result Value Ref Range Status   Specimen Description BLOOD LEFT HAND  Final   Special Requests   Final    BOTTLES DRAWN AEROBIC ONLY Blood Culture results may not be optimal due to an inadequate volume of blood received in culture bottles   Culture   Final    NO GROWTH 5 DAYS Performed at [facility], [place] [address] [place], [place] [address]    Report Status 02/14/2020 FINAL  Final    Coagulation Studies: No results for input(s): LABPROT, INR in the last 72 hours.  Urinalysis: No results for input(s): COLORURINE, LABSPEC, PHURINE, GLUCOSEU,  HGBUR, BILIRUBINUR, KETONESUR, PROTEINUR, UROBILINOGEN, NITRITE, LEUKOCYTESUR in the last 72 hours.  Invalid input(s): APPERANCEUR    Imaging: No results found.   Medications:   . sodium chloride Stopped (02/10/20 1409)  . ferumoxytol     . allopurinol  50 mg Oral Daily  . antiseptic oral rinse  15 mL Mouth Rinse BID  . atorvastatin  80 mg Oral q1800  . buPROPion  100 mg Oral TID  . carvedilol  25 mg Oral Q12H  . Chlorhexidine Gluconate Cloth  6 each Topical Daily  . clopidogrel  75 mg Oral Daily  . darbepoetin (ARANESP) injection - NON-DIALYSIS  60 mcg Subcutaneous Q Thu-1800  . doxycycline  100 mg Oral Q12H  . folic acid  1 mg Oral Daily  . insulin aspart  0-6 Units Subcutaneous TID WC  . lactulose  20 g Oral BID  . mirtazapine  7.5 mg Oral QHS  . omega-3 acid ethyl esters  1 g Oral BID  . sodium chloride flush  3 mL Intravenous Q12H  . sodium chloride flush  3 mL Intravenous Q12H   sodium chloride, acetaminophen **OR** acetaminophen, heparin, ondansetron **OR** ondansetron (ZOFRAN) IV, Resource ThickenUp Clear, sodium chloride flush  Assessment/ Plan:  1.  End-stage renal disease: Remains lethargic despite sequential dialysis.  She is now end-stage renal disease.  Started on dialysis via temporary IJ dialysis catheter placed by IR after RUA AVF deemed unusable;  .  She will need conversion to [facility] and placement of permanent access at some point.  Patient continues to be lethargic we have provided sequential daily dialysis for 3 days there has been no sign of improvement neurologically.  We will plan next dialysis treatment 02/18/2020.  Recommend palliative medicine involvement. 2.  Right lower extremity cellulitis: Clinically  improving on oral doxycycline.  With [employer]. 3.  History of congestive heart failure with diastolic dysfunction: Currently appears to be compensated with dialysis started yesterday.  Leave off of diuretics and continue to monitor on dialysis. 4.  Anemia:  Downtrending hemoglobin and hematocrit, with iron deficiency and on ESA. 5.  Secondary hyperparathyroidism: Calcium and phosphorus levels currently at goal, poor oral intake and not on binders   LOS: 7 Sherril Croon @TODAY @7 :47 AM

## 2020-02-17 NOTE — Progress Notes (Signed)
Palliative Note:  Patient resting in bed. Will open eyes to verbal stimuli and touch to shoulder. She was able to appropriately state her name but continued to fall asleep during assessment. Denied pain. No family at the bedside. Lunch tray at the bedside uneaten.   I spoke with patient's daughter, [name] via phone. Offered continued palliative support. Daughters aware of patient's current condition and expressed receiving updates from medical team as needed. Goals of care remain set for full code full scope care.   Daughter states she has our number and family will contact us with further questions or if they have a need from our team. Support given.   Plan -Full Code/Full scope as requested by daughters -Recommendations for outpatient palliative (at minimum) for support and continued goals of care discussion at discharge  -PMT will continue to support and follow as needed. Daughter expressed family would contact our team if they had any further questions or needs.    Time Total: 20 min.   Visit consisted of counseling and education dealing with the complex and emotionally intense issues of symptom management and palliative care in the setting of serious and potentially life-threatening illness.Greater than 50%  of this time was spent counseling and coordinating care related to the above assessment and plan.  Alda Lea, AGPCNP-BC  Palliative Medicine Team [phone]

## 2020-02-17 NOTE — Progress Notes (Signed)
Pt lethargic and responsive to voice at the beginning of the shift. At 9:50 pm, pt alert and watching TV. Pt still oriented only x2 (person and place). RN able to entice pt to take 1 of 2 spoonfuls of her bedtime medications (crushed with chocolate pudding). Pt refused Lactulose and any other meds tonight.

## 2020-02-18 ENCOUNTER — Encounter (HOSPITAL_COMMUNITY): Payer: Self-pay | Admitting: *Deleted

## 2020-02-18 ENCOUNTER — Inpatient Hospital Stay (HOSPITAL_COMMUNITY): Payer: Medicare Other

## 2020-02-18 DIAGNOSIS — I639 Cerebral infarction, unspecified: Secondary | ICD-10-CM

## 2020-02-18 LAB — COMPREHENSIVE METABOLIC PANEL
ALT: 18 U/L (ref 0–44)
AST: 12 U/L — ABNORMAL LOW (ref 15–41)
Albumin: 1.9 g/dL — ABNORMAL LOW (ref 3.5–5.0)
Alkaline Phosphatase: 95 U/L (ref 38–126)
Anion gap: 14 (ref 5–15)
BUN: 39 mg/dL — ABNORMAL HIGH (ref 8–23)
CO2: 25 mmol/L (ref 22–32)
Calcium: 8.7 mg/dL — ABNORMAL LOW (ref 8.9–10.3)
Chloride: 103 mmol/L (ref 98–111)
Creatinine, Ser: 4.19 mg/dL — ABNORMAL HIGH (ref 0.44–1.00)
GFR calc Af Amer: 12 mL/min — ABNORMAL LOW (ref >60)
GFR calc non Af Amer: 10 mL/min — ABNORMAL LOW (ref >60)
Glucose, Bld: 156 mg/dL — ABNORMAL HIGH (ref 70–99)
Potassium: 4 mmol/L (ref 3.5–5.1)
Sodium: 142 mmol/L (ref 135–145)
Total Bilirubin: 1.2 mg/dL (ref 0.3–1.2)
Total Protein: 5.4 g/dL — ABNORMAL LOW (ref 6.5–8.1)

## 2020-02-18 LAB — LIPID PANEL
Cholesterol: 54 mg/dL (ref 0–200)
HDL: 20 mg/dL — ABNORMAL LOW (ref >40)
LDL Cholesterol: 12 mg/dL (ref 0–99)
Total CHOL/HDL Ratio: 2.7 RATIO
Triglycerides: 112 mg/dL (ref <150)
VLDL: 22 mg/dL (ref 0–40)

## 2020-02-18 LAB — CBC
HCT: 24.8 % — ABNORMAL LOW (ref 36.0–46.0)
Hemoglobin: 7.3 g/dL — ABNORMAL LOW (ref 12.0–15.0)
MCH: 30.7 pg (ref 26.0–34.0)
MCHC: 29.4 g/dL — ABNORMAL LOW (ref 30.0–36.0)
MCV: 104.2 fL — ABNORMAL HIGH (ref 80.0–100.0)
Platelets: 90 10*3/uL — ABNORMAL LOW (ref 150–400)
RBC: 2.38 MIL/uL — ABNORMAL LOW (ref 3.87–5.11)
RDW: 14.9 % (ref 11.5–15.5)
WBC: 11.2 10*3/uL — ABNORMAL HIGH (ref 4.0–10.5)
nRBC: 0 % (ref 0.0–0.2)

## 2020-02-18 LAB — FOLATE RBC
Folate, Hemolysate: 281 ng/mL
Folate, RBC: 1191 ng/mL (ref >498)
Hematocrit: 23.6 % — ABNORMAL LOW (ref 34.0–46.6)

## 2020-02-18 LAB — GLUCOSE, CAPILLARY
Glucose-Capillary: 154 mg/dL — ABNORMAL HIGH (ref 70–99)
Glucose-Capillary: 169 mg/dL — ABNORMAL HIGH (ref 70–99)
Glucose-Capillary: 133 mg/dL — ABNORMAL HIGH (ref 70–99)

## 2020-02-18 LAB — PREPARE RBC (CROSSMATCH)

## 2020-02-18 LAB — MAGNESIUM: Magnesium: 1.8 mg/dL (ref 1.7–2.4)

## 2020-02-18 MED ORDER — HEPARIN SODIUM (PORCINE) 1000 UNIT/ML DIALYSIS: 1000.0000 [IU] | INTRAMUSCULAR | Status: DC | PRN

## 2020-02-18 MED ORDER — LIDOCAINE HCL (PF) 1 % IJ SOLN: 5.0000 mL | INTRAMUSCULAR | Status: DC | PRN

## 2020-02-18 MED ORDER — SODIUM CHLORIDE 0.9% IV SOLUTION: Freq: Once | INTRAVENOUS | Status: DC

## 2020-02-18 MED ORDER — HEPARIN SODIUM (PORCINE) 1000 UNIT/ML IJ SOLN
INTRAMUSCULAR | Status: AC
  Administered 2020-02-18: 1000 [IU]
  Filled 2020-02-18: qty 4

## 2020-02-18 MED ORDER — SODIUM CHLORIDE 0.9 % IV SOLN: 100.0000 mL | INTRAVENOUS | Status: DC | PRN

## 2020-02-18 MED ORDER — PENTAFLUOROPROP-TETRAFLUOROETH EX AERO: 1.0000 "application " | INHALATION_SPRAY | CUTANEOUS | Status: DC | PRN

## 2020-02-18 MED ORDER — ALTEPLASE 2 MG IJ SOLR: 2.0000 mg | Freq: Once | INTRAMUSCULAR | Status: DC | PRN

## 2020-02-18 MED ORDER — RENA-VITE PO TABS
1.0000 | ORAL_TABLET | Freq: Every day | ORAL | Status: DC
  Administered 2020-02-18 – 2020-02-25 (×8): 1 via ORAL
  Filled 2020-02-18 (×9): qty 1

## 2020-02-18 MED ORDER — LIDOCAINE-PRILOCAINE 2.5-2.5 % EX CREA: 1.0000 "application " | TOPICAL_CREAM | CUTANEOUS | Status: DC | PRN

## 2020-02-18 MED ORDER — NEPRO/CARBSTEADY PO LIQD
237.0000 mL | Freq: Two times a day (BID) | ORAL | Status: DC
  Administered 2020-02-21 – 2020-02-26 (×6): 237 mL via ORAL

## 2020-02-18 MED ORDER — DARBEPOETIN ALFA 60 MCG/0.3ML IJ SOSY
PREFILLED_SYRINGE | INTRAMUSCULAR | Status: AC
  Administered 2020-02-18: 60 ug
  Filled 2020-02-18: qty 0.3

## 2020-02-18 NOTE — Progress Notes (Signed)
Patient has been pre-certified for [facility] [place] transportation services with [place] included. Renal Navigator met with patient at HD bedside to check in. Patient showed awareness that Navigator was there by opening her eyes halfway. Navigator asked how she is doing and she said, "fine." She made no other comments. She was minimally alert and responsive.   Renal Navigator will continue to follow for OP HD appropriateness.   Alphonzo Cruise, [place] Renal Navigator [phone]

## 2020-02-18 NOTE — Progress Notes (Signed)
PROGRESS NOTE    [name]  KLK:[id number] DOB: [birth year] DOA: 02/09/2020 PCP: Katherina Mires, MD   Brief Narrative:  69 year old female with history of DM-2, CAD s/p BMS to RCA, CVA, thrombocytopenia, diastolic CHF, CKD-5 with LUE aVF placed in 07/2019, PAD s/p left BKA presenting with progressive redness and edema of right lower extremity, and admitted for right lower extremity cellulitis, fluid overload and acute on chronic diastolic CHF.  Patient was started on IV vancomycin for cellulitis.    Developed AKI therefore nephrology team consulted initially started on Lasix and gentle hydration.  Renal function failed to improve therefore started on hemodialysis on 3/27.  Currently has right IJ temporary dialysis catheter in place.   Assessment & Plan:   Principal Problem:   Acute on chronic diastolic CHF (congestive heart failure) (HCC) Active Problems:   Acute renal failure superimposed on stage 4 chronic kidney disease (HCC)   Type 2 diabetes mellitus with renal manifestations (HCC)   CAD S/P PCI TO RCA for Inferior STEMI. BMS   CKD (chronic kidney disease), stage IV (HCC)   Anemia in chronic kidney disease   CVA (cerebral vascular accident) ([place])   AKI (acute kidney injury) ([place])   Cellulitis of right leg   Thrombocytopenia (HCC)   Hyperbilirubinemia   Xerostomia   Advanced care planning/counseling discussion   Full code status   Dry skin   Physical debility   Palliative care by specialist   Goals of care, counseling/discussion  Acute kidney injury on CKD stage V Anemia of chronic disease -Getting dialysis via temporary dialysis catheter.  Nephrology social worker spoke with the family regarding outpatient arrangements, until she gets her strength back it will be difficult to get this done. -Plans for HD today -BUN and creatinine improving. -Hemoglobin around baseline of 7.3.  1U PRBC today  Acute on chronic diastolic congestive heart failure, ejection fraction  65% -Improving with dialysis.  Right lower extremity cellulitis -Improved, completed 7 days of doxycycline, ended 9/79  Acute metabolic encephalopathy Acute/subacute CVA, corona radiata -Combination of uremia/hyperammonia.  No focal neuro deficits.  CT of the head is negative.  TSH, B12 are normal -Lactulose twice daily; ammonia levels have down trended down -Continue supportive care, avoid sedative medication. -Continue Plavix and statin -Neurology team consulted -Hemoglobin A1c 6.9, lipid panel pending  Diabetes mellitus type 2 controlled -Hemoglobin A1c 6.0.  Continue current regimen.  Normocytic anemia Mild folate deficiency -Continue folic acid, insulin sliding scale and Accu-Cheks  History of peripheral arterial disease status post left-sided BKA -Continue Plavix, Lipitor and Zetia  Coronary artery disease status post bare-metal stent to RCA in 2010 -Continue home cardiac meds  Lack of appetite/poor oral intake Mild protein calorie malnutrition -Continue mirtazapine.  Encourage p.o. intake.  Dietitian/speech and swallow working with her.  Goals of care discussion-seen by palliative care team recommending full scope of medical treatment at this time.    DVT prophylaxis: None secondary to thrombocytopenia Code Status: Full code Family Communication: Spoke extensively with patient's daughter yesterday afternoon and this morning again over the phone. Disposition Plan:   Patient From= home  Patient Anticipated D/C place= PT recommending SNF  Barriers= maintain hospital stay given her significant amount of weakness requiring dialysis.  Now diagnosed with acute CVA, neurology consulted.  Subjective: Laying in the bed, per nursing staff patient ate about 75% of her dinner and 75% of her breakfast this morning.  Somewhat slow to respond but answers appropriately.  Denies any other new complaints.  Review of Systems Otherwise negative except as per HPI, including: General  = no fevers, chills, dizziness,  fatigue HEENT/EYES = negative for loss of vision, double vision, blurred vision,  sore throa Cardiovascular= negative for chest pain, palpitation Respiratory/lungs= negative for shortness of breath, cough, wheezing; hemoptysis,  Gastrointestinal= negative for nausea, vomiting, abdominal pain Genitourinary= negative for Dysuria MSK = Negative for arthralgia, myalgias Neurology= Negative for headache, numbness, tingling  Psychiatry= Negative for suicidal and homocidal ideation Skin= Negative for Rash   Examination:  Constitutional: Not in acute distress, slow to respond but easily arousable, chronically ill-appearing Respiratory: Clear to auscultation bilaterally Cardiovascular: Normal sinus rhythm, no rubs Abdomen: Nontender nondistended good bowel sounds Musculoskeletal: No edema noted Skin: No rashes seen Neurologic: CN 2-12 grossly intact.  And nonfocal Psychiatric: Poor judgment and insight, alert to name and place Dialysis catheter noted  Objective: Vitals:   02/17/20 2200 02/18/20 0343 02/18/20 0530 02/18/20 0559  BP:   (!) 141/60   Pulse:   64   Resp:   18   Temp:   97.8 F (36.6 C)   TempSrc:      SpO2: 100% 100% 100%   Weight:    124.3 kg  Height:        Intake/Output Summary (Last 24 hours) at 02/18/2020 1158 Last data filed at 02/18/2020 0900 Gross per 24 hour  Intake 180 ml  Output --  Net 180 ml   Filed Weights   02/16/20 1610 02/17/20 0518 02/18/20 0559  Weight: 125 kg 125.6 kg 124.3 kg     Data Reviewed:   CBC: Recent Labs  Lab 02/15/20 0554 02/16/20 0506 02/16/20 1230 02/17/20 0443 02/18/20 0511  WBC 9.4 11.5* 11.3* 11.5* 11.2*  HGB 8.3* 7.6* 7.5* 7.3* 7.3*  HCT 27.2* 25.2* 25.1* 24.4* 24.8*  MCV 102.6* 102.0* 103.3* 102.5* 104.2*  PLT 96* 92* 93* 84* 90*   Basic Metabolic Panel: Recent Labs  Lab 02/12/20 0439 02/12/20 0439 02/13/20 0521 02/14/20 0722 02/15/20 0554 02/15/20 1357 02/16/20 0506  02/17/20 0443 02/18/20 0511  NA 141   < > 142 143  --  141  --  135 142  K 4.2   < > 3.9 3.6  --  3.8  --  3.6 4.0  CL 106   < > 108 108  --  103  --  100 103  CO2 21*   < > 20* 22  --  23  --  26 25  GLUCOSE 105*   < > 205* 109*  --  143*  --  170* 156*  BUN 98*   < > 101* 57*  --  61*  --  26* 39*  CREATININE 4.82*   < > 5.06* 3.40*  --  3.76*  --  2.68* 4.19*  CALCIUM 8.6*   < > 8.7* 8.8*  --  8.5*  --  8.2* 8.7*  MG 1.9   < > 2.0 1.7 1.9  --  1.7  --  1.8  PHOS 3.8  --  3.8 2.5  --  2.7  --   --   --    < > = values in this interval not displayed.   GFR: Estimated Creatinine Clearance: 16.2 mL/min (A) (by C-G formula based on SCr of 4.19 mg/dL (H)). Liver Function Tests: Recent Labs  Lab 02/13/20 0521 02/14/20 0722 02/15/20 1357 02/17/20 0443 02/18/20 0511  AST  --   --   --  14* 12*  ALT  --   --   --  19 18  ALKPHOS  --   --   --  109 95  BILITOT  --   --   --  1.6* 1.2  PROT  --   --   --  5.4* 5.4*  ALBUMIN 2.3* 2.3* 2.2* 2.0* 1.9*   No results for input(s): LIPASE, AMYLASE in the last 168 hours. Recent Labs  Lab 02/12/20 0439 02/13/20 0521 02/17/20 0443  AMMONIA 45* 42* 19   Coagulation Profile: No results for input(s): INR, PROTIME in the last 168 hours. Cardiac Enzymes: No results for input(s): CKTOTAL, CKMB, CKMBINDEX, TROPONINI in the last 168 hours. BNP (last 3 results) No results for input(s): PROBNP in the last 8760 hours. HbA1C: No results for input(s): HGBA1C in the last 72 hours. CBG: Recent Labs  Lab 02/17/20 1151 02/17/20 1614 02/17/20 2012 02/18/20 0613 02/18/20 1124  GLUCAP 144* 129* 157* 154* 169*   Lipid Profile: No results for input(s): CHOL, HDL, LDLCALC, TRIG, CHOLHDL, LDLDIRECT in the last 72 hours. Thyroid Function Tests: No results for input(s): TSH, T4TOTAL, FREET4, T3FREE, THYROIDAB in the last 72 hours. Anemia Panel: Recent Labs    02/17/20 1410  VITAMINB12 1,216*   Sepsis Labs: No results for input(s):  PROCALCITON, LATICACIDVEN in the last 168 hours.  Recent Results (from the past 240 hour(s))  Blood culture (routine x 2)     Status: None   Collection Time: 02/09/20  1:25 AM   Specimen: BLOOD LEFT ARM  Result Value Ref Range Status   Specimen Description BLOOD LEFT ARM  Final   Special Requests   Final    BOTTLES DRAWN AEROBIC AND ANAEROBIC Blood Culture adequate volume   Culture   Final    NO GROWTH 5 DAYS Performed at [facility], [address] [place], [place] [address]    Report Status 02/14/2020 FINAL  Final  Respiratory Panel by RT PCR (Flu A&B, Covid) - Nasopharyngeal Swab     Status: None   Collection Time: 02/09/20  3:08 AM   Specimen: Nasopharyngeal Swab  Result Value Ref Range Status   SARS Coronavirus 2 by RT PCR NEGATIVE NEGATIVE Final    Comment: (NOTE) SARS-CoV-2 target nucleic acids are NOT DETECTED. The SARS-CoV-2 RNA is generally detectable in upper respiratoy specimens during the acute phase of infection. The lowest concentration of SARS-CoV-2 viral copies this assay can detect is 131 copies/mL. A negative result does not preclude SARS-Cov-2 infection and should not be used as the sole basis for treatment or other patient management decisions. A negative result may occur with  improper specimen collection/handling, submission of specimen other than nasopharyngeal swab, presence of viral mutation(s) within the areas targeted by this assay, and inadequate number of viral copies (<131 copies/mL). A negative result must be combined with clinical observations, patient history, and epidemiological information. The expected result is Negative. Fact Sheet for Patients:  PinkCheek.be Fact Sheet for Healthcare Providers:  GravelBags.it This test is not yet ap proved or cleared by the [place] FDA and  has been authorized for detection and/or diagnosis of SARS-CoV-2 by FDA under an Emergency Use  Authorization (EUA). This EUA will remain  in effect (meaning this test can be used) for the duration of the COVID-19 declaration under Section 564(b)(1) of the Act, 21 U.S.C. section 360bbb-3(b)(1), unless the authorization is terminated or revoked sooner.    Influenza A by PCR NEGATIVE NEGATIVE Final   Influenza B by PCR NEGATIVE NEGATIVE Final    Comment: (NOTE) The Xpert  Xpress SARS-CoV-2/FLU/RSV assay is intended as an aid in  the diagnosis of influenza from Nasopharyngeal swab specimens and  should not be used as a sole basis for treatment. Nasal washings and  aspirates are unacceptable for Xpert Xpress SARS-CoV-2/FLU/RSV  testing. Fact Sheet for Patients: PinkCheek.be Fact Sheet for Healthcare Providers: GravelBags.it This test is not yet approved or cleared by the [place] FDA and  has been authorized for detection and/or diagnosis of SARS-CoV-2 by  FDA under an Emergency Use Authorization (EUA). This EUA will remain  in effect (meaning this test can be used) for the duration of the  Covid-19 declaration under Section 564(b)(1) of the Act, 21  U.S.C. section 360bbb-3(b)(1), unless the authorization is  terminated or revoked. Performed at [facility], [place] [address] [place], [place] [address]   Blood culture (routine x 2)     Status: None   Collection Time: 02/09/20  3:15 AM   Specimen: BLOOD LEFT HAND  Result Value Ref Range Status   Specimen Description BLOOD LEFT HAND  Final   Special Requests   Final    BOTTLES DRAWN AEROBIC ONLY Blood Culture results may not be optimal due to an inadequate volume of blood received in culture bottles   Culture   Final    NO GROWTH 5 DAYS Performed at [facility], [place] [address], [place], [place] [id number]    Report Status 02/14/2020 FINAL  Final         Radiology Studies: MR BRAIN WO CONTRAST  Result Date: 02/18/2020 CLINICAL DATA:  Acute metabolic  encephalopathy EXAM: MRI HEAD WITHOUT CONTRAST TECHNIQUE: Multiplanar, multiecho pulse sequences of the brain and surrounding structures were obtained without intravenous contrast. COMPARISON:  MRI 12/19/2016 FINDINGS: Brain: There is a small subcentimeter focus of mildly reduced diffusion in the left corona radiata adjacent to a chronic small vessel infarct. Confluent areas of T2 hyperintensity in the supratentorial white matter are nonspecific but may reflect advanced chronic microvascular ischemic changes. Chronic infarcts are identified including involvement of the right occipital lobe and right cerebellum. Scattered small foci of susceptibility hypointensity again identified within the cerebral white matter, left lentiform nucleus, pons, and cerebellum likely reflecting chronic microhemorrhages. Increase compared to the prior study may be on a technical basis. Prominent T2 hypointensity of the basal ganglia likely reflects mineralization or other deposition. Prominence of the ventricles and sulci reflects moderate generalized parenchymal volume loss. Vascular: Major vessel flow voids at the skull base are preserved. Skull and upper cervical spine: Marrow signal is mildly heterogeneous. No aggressive osseous lesion. Sinuses/Orbits: Paranasal sinuses are aerated. Right lens replacement. Other: Incidental note is made of a partially empty sella facet uremic suture. Mastoid air cells are clear. IMPRESSION: Small acute to subacute infarction of the left corona radiata. Chronic/nonemergent findings detailed above including infarcts, chronic microvascular ischemic changes, and microhemorrhages. Electronically Signed   By: Macy Mis M.D.   On: 02/18/2020 09:50        Scheduled Meds: . sodium chloride   Intravenous Once  . allopurinol  50 mg Oral Daily  . antiseptic oral rinse  15 mL Mouth Rinse BID  . atorvastatin  80 mg Oral q1800  . buPROPion  100 mg Oral TID  . carvedilol  25 mg Oral Q12H  .  Chlorhexidine Gluconate Cloth  6 each Topical Q0600  . clopidogrel  75 mg Oral Daily  . darbepoetin (ARANESP) injection - NON-DIALYSIS  60 mcg Subcutaneous Q Thu-1800  . folic acid  1 mg Oral Daily  . insulin aspart  0-6 Units Subcutaneous TID WC  . lactulose  20 g Oral BID  . mirtazapine  7.5 mg Oral QHS  . omega-3 acid ethyl esters  1 g Oral BID  . sodium chloride flush  3 mL Intravenous Q12H   Continuous Infusions: . sodium chloride Stopped (02/10/20 1409)  . ferumoxytol       LOS: 8 days   Time spent= 35 mins    Chasiti Waddington Arsenio Loader, MD [facility]  If 7PM-7AM, please contact night-coverage  02/18/2020, 11:58 AM

## 2020-02-18 NOTE — Progress Notes (Signed)
Initial Nutrition Assessment  DOCUMENTATION CODES:   Morbid obesity  INTERVENTION:   -Renal MVI daily -Nepro Shake po BID, each supplement provides 425 kcal and 19 grams protein -Magic cup TID with meals, each supplement provides 290 kcal and 9 grams of protein -Feeding assistance with meals  NUTRITION DIAGNOSIS:   Inadequate oral intake related to lethargy/confusion as evidenced by meal completion < 50%.  GOAL:   Patient will meet greater than or equal to 90% of their needs  MONITOR:   PO intake, Supplement acceptance, Diet advancement, Labs, Weight trends, Skin, I & O's  REASON FOR ASSESSMENT:   Consult Assessment of nutrition requirement/status, Diet education  ASSESSMENT:   69 year old female with history of DM-2, CAD s/p BMS to RCA, CVA, thrombocytopenia, diastolic CHF, CKD-5 with LUE aVF placed in 07/2019, PAD s/p left BKA presenting with progressive redness and edema of right lower extremity, and admitted for right lower extremity cellulitis, fluid overload and acute on chronic diastolic CHF.  Patient was started on IV vancomycin for cellulitis.    Developed AKI therefore nephrology team consulted initially started on Lasix and gentle hydration.  Renal function failed to improve therefore started on hemodialysis on 3/27.  Currently has right IJ temporary dialysis catheter in place.  Pt admitted with acute on chronic CHF.   3/26- s/p HD cath placement, HD initiated 3/28- s/p BSE- advanced to dysphagia 1 diet with nectar thick liquids  Reviewed I/O's: +90 ml x 24 hours and -386 ml since admission  Attempted to speak with pt x 2, however, pt either receiving nursing care or off unit for procedure at time of visit.   Observed breakfast meal tray- noted pt consumed 90% of pureed sausage and pureed eggs as well as 50% of nectar thick juice, which is an improvement over the past few days. Noted meal completion 0-75% (about 25-45%of meals). Per SLP notes, lethargy is pt's  biggest barrier to diet advancement and PO intake.   Medications reviewed and include folic acid, remeron, and lactulose.   Albumin has a half-life of 21 days and is strongly affected by stress response and inflammatory process, therefore, do not expect to see an improvement in this lab value during acute hospitalization. When a patient presents with low albumin, it is likely skewed due to the acute inflammatory response.  Unless it is suspected that patient had poor PO intake or malnutrition prior to admission, then RD should not be consulted solely for low albumin. Note that low albumin is no longer used to diagnose malnutrition; [facility] uses the new malnutrition guidelines published by the American Society for Parenteral and Enteral Nutrition (A.S.P.E.N.) and the Academy of Nutrition and Dietetics (AND).    Lab Results  Component Value Date   HGBA1C 6.0 (H) 02/09/2020   PTA DM medications are 0-9 units insulin lispro TID and  0-9 units insulin aspart TID.   Labs reviewed: CBGS: 129-157 (inpatient orders for glycemic control are 0-6 units insulin aspart TID with meals).   Diet Order:   Diet Order            DIET - DYS 1 Room service appropriate? Yes; Fluid consistency: Nectar Thick  Diet effective now              EDUCATION NEEDS:   Not appropriate for education at this time  Skin:  Skin Assessment: Skin Integrity Issues: Skin Integrity Issues:: Other (Comment) Other: MASD to groin and buttocks  Last BM:  02/17/20  Height:   Ht  Readings from Last 1 Encounters:  02/18/20 5\' 2"  (1.575 m)    Weight:   Wt Readings from Last 1 Encounters:  02/18/20 120 kg    Ideal Body Weight:  46.8 kg(adjusted for lt BKA)  BMI:  Body mass index is 48.39 kg/m.  Estimated Nutritional Needs:   Kcal:  1650-1850  Protein:  100-115 grams  Fluid:  1000 ml + UOP    Loistine Chance, RD, LDN, [place] Registered Dietitian II Certified Diabetes Care and Education Specialist Please refer to  [facility] for RD and/or RD on-call/weekend/after hours pager

## 2020-02-18 NOTE — Progress Notes (Signed)
[facility] ROUNDING NOTE   Subjective:   This is 69 year old lady with history of diabetes coronary artery disease CVA thrombocytopenia diastolic heart failure CKD stage V, now end-stage renal disease on dialysis.  History of left upper arm extremity AV fistula placed 07/2019, PAD status post left BKA fluid overload and right lower extremity cellulitis admitted 02/09/2020.  Thought to have uremic encephalopathy contributing to lethargy.  Was initiated on hemodialysis.    dialysis #3 1 L removed 02/16/2020.  Dialysis scheduled for 11/2019.  Appreciate assistance from palliative medicine.  Blood pressure 141/60 pulse 65 temperature 97.8 O2 sats 100% 2.5% flow  Sodium 142 potassium 4 chloride 103 CO2 25 BUN 39 creatinine 4.19 glucose 156 calcium 8.7 albumin 1.9 magnesium 1.8 hemoglobin 7.3 WBC 11.2 platelets 90  Allopurinol 50 mg daily atorvastatin 80 mg daily Wellbutrin 100 mg 3 times daily Coreg 25 mg every 12 hours Plavix 75 mg daily darbepoetin 60 mcg every Thursday doxycycline 100 mg every 12 hours, insulin sliding scale  Objective:  Vital signs in last 24 hours:  Temp:  [97.3 F (36.3 C)-98.7 F (37.1 C)] 97.8 F (36.6 C) (04/01 0530) Pulse Rate:  [64-78] 64 (04/01 0530) Resp:  [18] 18 (04/01 0530) BP: (124-144)/(48-60) 141/60 (04/01 0530) SpO2:  [86 %-100 %] 100 % (04/01 0530) Weight:  [124.3 kg] 124.3 kg (04/01 0559)  Weight change: 7.386 kg Filed Weights   02/16/20 1610 02/17/20 0518 02/18/20 0559  Weight: 125 kg 125.6 kg 124.3 kg    Intake/Output: I/O last 3 completed shifts: In: 90 [P.O.:90] Out: -    Intake/Output this shift:  No intake/output data recorded.  Gen: Awake and comfortable nondistressed nonverbal CVS: Pulse regular rhythm, normal rate, S1 and S2 normal.  Right IJ hemodialysis catheter Resp: Poor inspiratory effort with decreased breath sounds over bases, no rales Abd: Soft, flat, nontender Ext: Right leg in Ace wrap, trace edema left ankle.   Short pulsatile right upper arm AVF   Basic Metabolic Panel: Recent Labs  Lab 02/12/20 0439 02/12/20 0439 02/13/20 0521 02/13/20 0521 02/14/20 0722 02/14/20 0722 02/15/20 0554 02/15/20 1357 02/16/20 0506 02/17/20 0443 02/18/20 0511  NA 141   < > 142  --  143  --   --  141  --  135 142  K 4.2   < > 3.9  --  3.6  --   --  3.8  --  3.6 4.0  CL 106   < > 108  --  108  --   --  103  --  100 103  CO2 21*   < > 20*  --  22  --   --  23  --  26 25  GLUCOSE 105*   < > 205*  --  109*  --   --  143*  --  170* 156*  BUN 98*   < > 101*  --  57*  --   --  61*  --  26* 39*  CREATININE 4.82*   < > 5.06*  --  3.40*  --   --  3.76*  --  2.68* 4.19*  CALCIUM 8.6*   < > 8.7*   < > 8.8*   < >  --  8.5*  --  8.2* 8.7*  MG 1.9   < > 2.0  --  1.7  --  1.9  --  1.7  --  1.8  PHOS 3.8  --  3.8  --  2.5  --   --  2.7  --   --   --    < > = values in this interval not displayed.    Liver Function Tests: Recent Labs  Lab 02/13/20 0521 02/14/20 0722 02/15/20 1357 02/17/20 0443 02/18/20 0511  AST  --   --   --  14* 12*  ALT  --   --   --  19 18  ALKPHOS  --   --   --  109 95  BILITOT  --   --   --  1.6* 1.2  PROT  --   --   --  5.4* 5.4*  ALBUMIN 2.3* 2.3* 2.2* 2.0* 1.9*   No results for input(s): LIPASE, AMYLASE in the last 168 hours. Recent Labs  Lab 02/12/20 0439 02/13/20 0521 02/17/20 0443  AMMONIA 45* 42* 19    CBC: Recent Labs  Lab 02/15/20 0554 02/16/20 0506 02/16/20 1230 02/17/20 0443 02/18/20 0511  WBC 9.4 11.5* 11.3* 11.5* 11.2*  HGB 8.3* 7.6* 7.5* 7.3* 7.3*  HCT 27.2* 25.2* 25.1* 24.4* 24.8*  MCV 102.6* 102.0* 103.3* 102.5* 104.2*  PLT 96* 92* 93* 84* 90*    Cardiac Enzymes: No results for input(s): CKTOTAL, CKMB, CKMBINDEX, TROPONINI in the last 168 hours.  BNP: Invalid input(s): POCBNP  CBG: Recent Labs  Lab 02/17/20 0755 02/17/20 1151 02/17/20 1614 02/17/20 2012 02/18/20 0613  GLUCAP 154* 144* 129* 157* 154*    Microbiology: Results for orders  placed or performed during the hospital encounter of 02/09/20  Blood culture (routine x 2)     Status: None   Collection Time: 02/09/20  1:25 AM   Specimen: BLOOD LEFT ARM  Result Value Ref Range Status   Specimen Description BLOOD LEFT ARM  Final   Special Requests   Final    BOTTLES DRAWN AEROBIC AND ANAEROBIC Blood Culture adequate volume   Culture   Final    NO GROWTH 5 DAYS Performed at [facility], [place] [address] [place], [place] [address]    Report Status 02/14/2020 FINAL  Final  Respiratory Panel by RT PCR (Flu A&B, Covid) - Nasopharyngeal Swab     Status: None   Collection Time: 02/09/20  3:08 AM   Specimen: Nasopharyngeal Swab  Result Value Ref Range Status   SARS Coronavirus 2 by RT PCR NEGATIVE NEGATIVE Final    Comment: (NOTE) SARS-CoV-2 target nucleic acids are NOT DETECTED. The SARS-CoV-2 RNA is generally detectable in upper respiratoy specimens during the acute phase of infection. The lowest concentration of SARS-CoV-2 viral copies this assay can detect is 131 copies/mL. A negative result does not preclude SARS-Cov-2 infection and should not be used as the sole basis for treatment or other patient management decisions. A negative result may occur with  improper specimen collection/handling, submission of specimen other than nasopharyngeal swab, presence of viral mutation(s) within the areas targeted by this assay, and inadequate number of viral copies (<131 copies/mL). A negative result must be combined with clinical observations, patient history, and epidemiological information. The expected result is Negative. Fact Sheet for Patients:  PinkCheek.be Fact Sheet for Healthcare Providers:  GravelBags.it This test is not yet ap proved or cleared by the [place] FDA and  has been authorized for detection and/or diagnosis of SARS-CoV-2 by FDA under an Emergency Use Authorization (EUA). This EUA will  remain  in effect (meaning this test can be used) for the duration of the COVID-19 declaration under Section 564(b)(1) of the Act, 21 U.S.C. section 360bbb-3(b)(1), unless the  authorization is terminated or revoked sooner.    Influenza A by PCR NEGATIVE NEGATIVE Final   Influenza B by PCR NEGATIVE NEGATIVE Final    Comment: (NOTE) The Xpert Xpress SARS-CoV-2/FLU/RSV assay is intended as an aid in  the diagnosis of influenza from Nasopharyngeal swab specimens and  should not be used as a sole basis for treatment. Nasal washings and  aspirates are unacceptable for Xpert Xpress SARS-CoV-2/FLU/RSV  testing. Fact Sheet for Patients: PinkCheek.be Fact Sheet for Healthcare Providers: GravelBags.it This test is not yet approved or cleared by the [place] FDA and  has been authorized for detection and/or diagnosis of SARS-CoV-2 by  FDA under an Emergency Use Authorization (EUA). This EUA will remain  in effect (meaning this test can be used) for the duration of the  Covid-19 declaration under Section 564(b)(1) of the Act, 21  U.S.C. section 360bbb-3(b)(1), unless the authorization is  terminated or revoked. Performed at [facility], [place] [address], [place], [place] [address]   Blood culture (routine x 2)     Status: None   Collection Time: 02/09/20  3:15 AM   Specimen: BLOOD LEFT HAND  Result Value Ref Range Status   Specimen Description BLOOD LEFT HAND  Final   Special Requests   Final    BOTTLES DRAWN AEROBIC ONLY Blood Culture results may not be optimal due to an inadequate volume of blood received in culture bottles   Culture   Final    NO GROWTH 5 DAYS Performed at [facility], [place] [address] [place], [place] [address]    Report Status 02/14/2020 FINAL  Final    Coagulation Studies: No results for input(s): LABPROT, INR in the last 72 hours.  Urinalysis: No results for input(s): COLORURINE, LABSPEC,  PHURINE, GLUCOSEU, HGBUR, BILIRUBINUR, KETONESUR, PROTEINUR, UROBILINOGEN, NITRITE, LEUKOCYTESUR in the last 72 hours.  Invalid input(s): APPERANCEUR    Imaging: No results found.   Medications:   . sodium chloride Stopped (02/10/20 1409)  . ferumoxytol     . sodium chloride   Intravenous Once  . allopurinol  50 mg Oral Daily  . antiseptic oral rinse  15 mL Mouth Rinse BID  . atorvastatin  80 mg Oral q1800  . buPROPion  100 mg Oral TID  . carvedilol  25 mg Oral Q12H  . Chlorhexidine Gluconate Cloth  6 each Topical Q0600  . clopidogrel  75 mg Oral Daily  . darbepoetin (ARANESP) injection - NON-DIALYSIS  60 mcg Subcutaneous Q Thu-1800  . folic acid  1 mg Oral Daily  . insulin aspart  0-6 Units Subcutaneous TID WC  . lactulose  20 g Oral BID  . mirtazapine  7.5 mg Oral QHS  . omega-3 acid ethyl esters  1 g Oral BID  . sodium chloride flush  3 mL Intravenous Q12H   sodium chloride, acetaminophen **OR** acetaminophen, heparin, ondansetron **OR** ondansetron (ZOFRAN) IV, Resource ThickenUp Clear, sodium chloride flush  Assessment/ Plan:  1.  End-stage renal disease: Remains lethargic despite sequential dialysis.  She is now end-stage renal disease.  Started on dialysis via temporary IJ dialysis catheter placed by IR after RUA AVF deemed unusable;  .  She will need conversion to [facility] and placement of permanent access at some point.  Patient continues to be lethargic we have provided sequential daily dialysis for 3 days there has been no sign of improvement neurologically.  We will plan next dialysis treatment 02/18/2020.  Recommend palliative medicine involvement. 2.  Right lower  extremity cellulitis: Clinically improving on oral doxycycline.  With [employer]. 3.  History of congestive heart failure with diastolic dysfunction: Currently appears to be compensated with dialysis started yesterday.  Leave off of diuretics and continue to monitor on dialysis. 4.  Anemia: Downtrending hemoglobin  and hematocrit, with iron deficiency and on ESA. 5.  Secondary hyperparathyroidism: Calcium and phosphorus levels currently at goal, poor oral intake and not on binders   LOS: 8 Sherril Croon @TODAY @8 :07 AM

## 2020-02-18 NOTE — Progress Notes (Signed)
  Speech Language Pathology Treatment: Dysphagia  Patient Details Name: [name] MRN: [id number] DOB: 1951/03/20 Today's Date: 02/18/2020 Time: 4098-1191 SLP Time Calculation (min) (ACUTE ONLY): 15 min  Assessment / Plan / Recommendation Clinical Impression  Pt seen for dysphagia f/u.  She remains sleepy; however, she appeared to eat more from her breakfast tray this am, and was able to stay awake for a few minutes in order to participate.  MRI results from today noted, indicating small acute to subacute infarction of the left corona radiata. This lesion location could certainly be impacting swallow function.    Trials of thin liquids elicited immediate, explosive coughing initially; coughing subsided as trials proceeded. Pt required hand-over-hand assist to hold cup and bring to lips. She attempted to eat a graham cracker - however unable to fully attend nor masticate effectively, becoming more sleepy, so trials ceased and food was manually removed from her mouth.  Pt may benefit from an MBS to give [place] more information about the nature of her swallowing deficits; however, lethargy is really her primary obstacle.  Recommend keeping her on dysphagia 1, nectars for now - if she can remain alert enough to participate in MBS, we can proceed in the next 24-48 hours.   HPI HPI: Pt is a 69 y/o female admitted 02/09/20 secondary to RLE cellulitis and volume overload. PMH includes CAD s/p stent, DM, CKD, CVA, dCHF, L BKA. Pt had a previous swallow evaluation in 2014 with functional appearing swallow until challenged with large, consecutive boluses. Regular solids and thin liquids via small, single sips recommended at that time.  Pt has been very lethargic.  MRI completed on 02/18/20, revealing small acute to subacute infarction of the left corona radiata.      SLP Plan  Continue with current plan of care       Recommendations  Diet recommendations: Dysphagia 1 (puree);Nectar-thick liquid Liquids  provided via: Cup;Straw Medication Administration: Crushed with puree Supervision: Full supervision/cueing for compensatory strategies Compensations: Small sips/bites Postural Changes and/or Swallow Maneuvers: Seated upright 90 degrees                Oral Care Recommendations: Oral care BID SLP Visit Diagnosis: Dysphagia, oropharyngeal phase (R13.12) Plan: Continue with current plan of care       [name] L. Tivis Ringer, [place] Office number [phone] Pager [phone]                 Juan Quam [name] 02/18/2020, 10:52 AM

## 2020-02-19 ENCOUNTER — Inpatient Hospital Stay (HOSPITAL_COMMUNITY): Payer: Medicare Other

## 2020-02-19 DIAGNOSIS — T82868A Thrombosis of vascular prosthetic devices, implants and grafts, initial encounter: Secondary | ICD-10-CM

## 2020-02-19 DIAGNOSIS — N186 End stage renal disease: Secondary | ICD-10-CM

## 2020-02-19 DIAGNOSIS — Z992 Dependence on renal dialysis: Secondary | ICD-10-CM

## 2020-02-19 DIAGNOSIS — I739 Peripheral vascular disease, unspecified: Secondary | ICD-10-CM

## 2020-02-19 LAB — TYPE AND SCREEN
ABO/RH(D): O POS
Antibody Screen: NEGATIVE
Unit division: 0

## 2020-02-19 LAB — CBC
HCT: 26.3 % — ABNORMAL LOW (ref 36.0–46.0)
Hemoglobin: 8 g/dL — ABNORMAL LOW (ref 12.0–15.0)
MCH: 30.5 pg (ref 26.0–34.0)
MCHC: 30.4 g/dL (ref 30.0–36.0)
MCV: 100.4 fL — ABNORMAL HIGH (ref 80.0–100.0)
Platelets: 79 10*3/uL — ABNORMAL LOW (ref 150–400)
RBC: 2.62 MIL/uL — ABNORMAL LOW (ref 3.87–5.11)
RDW: 16.3 % — ABNORMAL HIGH (ref 11.5–15.5)
WBC: 10.5 10*3/uL (ref 4.0–10.5)
nRBC: 0.2 % (ref 0.0–0.2)

## 2020-02-19 LAB — COMPREHENSIVE METABOLIC PANEL
ALT: 14 U/L (ref 0–44)
AST: 11 U/L — ABNORMAL LOW (ref 15–41)
Albumin: 2 g/dL — ABNORMAL LOW (ref 3.5–5.0)
Alkaline Phosphatase: 99 U/L (ref 38–126)
Anion gap: 10 (ref 5–15)
BUN: 16 mg/dL (ref 8–23)
CO2: 28 mmol/L (ref 22–32)
Calcium: 8.4 mg/dL — ABNORMAL LOW (ref 8.9–10.3)
Chloride: 100 mmol/L (ref 98–111)
Creatinine, Ser: 2.46 mg/dL — ABNORMAL HIGH (ref 0.44–1.00)
GFR calc Af Amer: 23 mL/min — ABNORMAL LOW (ref >60)
GFR calc non Af Amer: 19 mL/min — ABNORMAL LOW (ref >60)
Glucose, Bld: 151 mg/dL — ABNORMAL HIGH (ref 70–99)
Potassium: 3.4 mmol/L — ABNORMAL LOW (ref 3.5–5.1)
Sodium: 138 mmol/L (ref 135–145)
Total Bilirubin: 1.3 mg/dL — ABNORMAL HIGH (ref 0.3–1.2)
Total Protein: 5.7 g/dL — ABNORMAL LOW (ref 6.5–8.1)

## 2020-02-19 LAB — GLUCOSE, CAPILLARY
Glucose-Capillary: 142 mg/dL — ABNORMAL HIGH (ref 70–99)
Glucose-Capillary: 188 mg/dL — ABNORMAL HIGH (ref 70–99)
Glucose-Capillary: 170 mg/dL — ABNORMAL HIGH (ref 70–99)
Glucose-Capillary: 245 mg/dL — ABNORMAL HIGH (ref 70–99)

## 2020-02-19 LAB — HEMOGLOBIN AND HEMATOCRIT, BLOOD
HCT: 25.8 % — ABNORMAL LOW (ref 36.0–46.0)
Hemoglobin: 8 g/dL — ABNORMAL LOW (ref 12.0–15.0)

## 2020-02-19 LAB — BPAM RBC
Blood Product Expiration Date: 202105042359
ISSUE DATE / TIME: 202104011612
Unit Type and Rh: 5100

## 2020-02-19 LAB — MAGNESIUM: Magnesium: 1.6 mg/dL — ABNORMAL LOW (ref 1.7–2.4)

## 2020-02-19 MED ORDER — BIOTENE DRY MOUTH MT LIQD
15.0000 mL | Freq: Two times a day (BID) | OROMUCOSAL | Status: DC
  Administered 2020-02-21 – 2020-02-25 (×2): 15 mL via OROMUCOSAL

## 2020-02-19 MED ORDER — CHLORHEXIDINE GLUCONATE CLOTH 2 % EX PADS
6.0000 | MEDICATED_PAD | Freq: Every day | CUTANEOUS | Status: DC
  Administered 2020-02-19 – 2020-02-20 (×2): 6 via TOPICAL

## 2020-02-19 NOTE — Progress Notes (Signed)
PROGRESS NOTE    [name]  VWU:[id number] DOB: 12/14/1950 DOA: 02/09/2020 PCP: Katherina Mires, MD   Brief Narrative:  69 year old female with history of DM-2, CAD s/p BMS to RCA, CVA, thrombocytopenia, diastolic CHF, CKD-5 with LUE aVF placed in 07/2019, PAD s/p left BKA presenting with progressive redness and edema of right lower extremity, and admitted for right lower extremity cellulitis, fluid overload and acute on chronic diastolic CHF.  Patient was started on IV vancomycin for cellulitis.    Developed AKI therefore nephrology team consulted initially started on Lasix and gentle hydration.  Renal function failed to improve therefore started on hemodialysis on 3/27.  Currently has right IJ temporary dialysis catheter in place.   Assessment & Plan:   Principal Problem:   Acute on chronic diastolic CHF (congestive heart failure) (HCC) Active Problems:   Acute renal failure superimposed on stage 4 chronic kidney disease (HCC)   Type 2 diabetes mellitus with renal manifestations (HCC)   CAD S/P PCI TO RCA for Inferior STEMI. BMS   CKD (chronic kidney disease), stage IV (HCC)   Anemia in chronic kidney disease   CVA (cerebral vascular accident) ([place])   AKI (acute kidney injury) ([place])   Cellulitis of right leg   Thrombocytopenia (HCC)   Hyperbilirubinemia   Xerostomia   Advanced care planning/counseling discussion   Full code status   Dry skin   Physical debility   Palliative care by specialist   Goals of care, counseling/discussion   Acute CVA (cerebrovascular accident) ([place])  Acute kidney injury on CKD stage V Anemia of chronic disease -Getting dialysis via temporary dialysis catheter.  Nephrology social worker spoke with the family regarding outpatient arrangements, until she gets her strength back it will be difficult to get this done. -HD plans per nephrology team -BUN and creatinine improving. -Status post 1 unit PRBC 4/1.  Acute on chronic diastolic congestive heart  failure, ejection fraction 65% -Stable with dialysis  Right lower extremity cellulitis -Improved, completed 7 days of doxycycline, ended 2/95  Acute metabolic encephalopathy Acute/subacute CVA, corona radiata -Combination of uremia/hyperammonia.  No focal neuro deficits.  CT of the head is negative.  TSH, B12 are normal -Lactulose twice daily -Continue supportive care, avoid sedative medication. -Continue Plavix and statin -Spoke with Dr Cheral Marker on 4/1 who reviewed patient's MRI.  No further input at this time.  Continue current management. -Hemoglobin A1c 6.9, LDL 12  Diabetes mellitus type 2 controlled -Hemoglobin A1c 6.0.  Continue current regimen.  Normocytic anemia Mild folate deficiency -Continue folic acid, insulin sliding scale and Accu-Cheks  History of peripheral arterial disease status post left-sided BKA -Continue Plavix, Lipitor and Zetia  Coronary artery disease status post bare-metal stent to RCA in 2010 -Continue home cardiac meds  Lack of appetite/poor oral intake Mild protein calorie malnutrition -Continue mirtazapine.  Encourage p.o. intake.  Dietitian/speech and swallow working with her.MBS planned today  Goals of care discussion-palliative care team following I met with both the daughters at bedside had extensive discussion regarding her condition, comorbidities and prognosis.  Family is very appreciated of discussion, they will continue to have discussion with rest of the siblings.    DVT prophylaxis: None secondary to thrombocytopenia Code Status: Full code Family Communication: Spoke with 2 daughters at bedside extensively with their aunt over the phone Disposition Plan:   Patient From= home  Patient Anticipated D/C place= PT recommending SNF  Barriers= maintain hospital stay given her significant amount of weakness requiring dialysis.  Now diagnosed with  acute CVA, neurology consulted.   Subjective: Saw the patient this morning when she was  sitting up in the chair, had probably about 50% of her breakfast.  She was only able to sit in chair for little over an hour.  Later in the day I met with today with the daughter is at bedside had extensive discussion regarding her goals of care, condition, comorbidities and prognosis.  They are to speak with rest of the sibling and continue to have ongoing discussions.  All the questions have been answered by me.  Review of Systems Otherwise negative except as per HPI, including: General = no fevers, chills, dizziness,  fatigue HEENT/EYES = negative for loss of vision, double vision, blurred vision,  sore throa Cardiovascular= negative for chest pain, palpitation Respiratory/lungs= negative for shortness of breath, cough, wheezing; hemoptysis,  Gastrointestinal= negative for nausea, vomiting, abdominal pain Genitourinary= negative for Dysuria MSK = Negative for arthralgia, myalgias Neurology= Negative for headache, numbness, tingling  Psychiatry= Negative for suicidal and homocidal ideation Skin= Negative for Rash   Examination:  Constitutional: Not in acute distress, chronically ill and frail appearing. Respiratory: Clear to auscultation bilaterally Cardiovascular: Normal sinus rhythm, no rubs Abdomen: Nontender nondistended good bowel sounds Musculoskeletal: No edema noted Skin: No rashes seen Neurologic: CN 2-12 grossly intact.  And nonfocal Psychiatric: Chronically ill and frail, alert awake oriented to name place  HD catheter noted  Objective: Vitals:   02/18/20 1830 02/18/20 2042 02/19/20 0400 02/19/20 1212  BP: (!) 112/53 (!) 135/52 136/61 (!) 122/47  Pulse: 64 64 70 63  Resp: '17 19 20 18  ' Temp: 98.2 F (36.8 C) 98.2 F (36.8 C) 98.9 F (37.2 C) (!) 97.3 F (36.3 C)  TempSrc: Oral Oral Oral Oral  SpO2: 98% 100% 100% 100%  Weight: 118.4 kg  109.8 kg   Height:        Intake/Output Summary (Last 24 hours) at 02/19/2020 1319 Last data filed at 02/18/2020 1836 Gross  per 24 hour  Intake 2201.97 ml  Output 1819 ml  Net 382.97 ml   Filed Weights   02/18/20 1600 02/18/20 1830 02/19/20 0400  Weight: 120 kg 118.4 kg 109.8 kg     Data Reviewed:   CBC: Recent Labs  Lab 02/16/20 0506 02/16/20 0506 02/16/20 1230 02/16/20 1230 02/17/20 0443 02/17/20 1410 02/18/20 0511 02/18/20 2352 02/19/20 0410  WBC 11.5*  --  11.3*  --  11.5*  --  11.2*  --  10.5  HGB 7.6*   < > 7.5*  --  7.3*  --  7.3* 8.0* 8.0*  HCT 25.2*   < > 25.1*   < > 24.4* 23.6* 24.8* 25.8* 26.3*  MCV 102.0*  --  103.3*  --  102.5*  --  104.2*  --  100.4*  PLT 92*  --  93*  --  84*  --  90*  --  79*   < > = values in this interval not displayed.   Basic Metabolic Panel: Recent Labs  Lab 02/13/20 0521 02/13/20 0521 02/14/20 0722 02/15/20 0554 02/15/20 1357 02/16/20 0506 02/17/20 0443 02/18/20 0511 02/19/20 0410  NA 142   < > 143  --  141  --  135 142 138  K 3.9   < > 3.6  --  3.8  --  3.6 4.0 3.4*  CL 108   < > 108  --  103  --  100 103 100  CO2 20*   < > 22  --  23  --  '26 25 28  ' GLUCOSE 205*   < > 109*  --  143*  --  170* 156* 151*  BUN 101*   < > 57*  --  61*  --  26* 39* 16  CREATININE 5.06*   < > 3.40*  --  3.76*  --  2.68* 4.19* 2.46*  CALCIUM 8.7*   < > 8.8*  --  8.5*  --  8.2* 8.7* 8.4*  MG 2.0   < > 1.7 1.9  --  1.7  --  1.8 1.6*  PHOS 3.8  --  2.5  --  2.7  --   --   --   --    < > = values in this interval not displayed.   GFR: Estimated Creatinine Clearance: 25.6 mL/min (A) (by C-G formula based on SCr of 2.46 mg/dL (H)). Liver Function Tests: Recent Labs  Lab 02/14/20 0722 02/15/20 1357 02/17/20 0443 02/18/20 0511 02/19/20 0410  AST  --   --  14* 12* 11*  ALT  --   --  '19 18 14  ' ALKPHOS  --   --  109 95 99  BILITOT  --   --  1.6* 1.2 1.3*  PROT  --   --  5.4* 5.4* 5.7*  ALBUMIN 2.3* 2.2* 2.0* 1.9* 2.0*   No results for input(s): LIPASE, AMYLASE in the last 168 hours. Recent Labs  Lab 02/13/20 0521 02/17/20 0443  AMMONIA 42* 19    Coagulation Profile: No results for input(s): INR, PROTIME in the last 168 hours. Cardiac Enzymes: No results for input(s): CKTOTAL, CKMB, CKMBINDEX, TROPONINI in the last 168 hours. BNP (last 3 results) No results for input(s): PROBNP in the last 8760 hours. HbA1C: No results for input(s): HGBA1C in the last 72 hours. CBG: Recent Labs  Lab 02/18/20 0613 02/18/20 1124 02/18/20 2226 02/19/20 0623 02/19/20 1156  GLUCAP 154* 169* 133* 142* 188*   Lipid Profile: Recent Labs    02/18/20 1425  CHOL 54  HDL 20*  LDLCALC 12  TRIG 112  CHOLHDL 2.7   Thyroid Function Tests: No results for input(s): TSH, T4TOTAL, FREET4, T3FREE, THYROIDAB in the last 72 hours. Anemia Panel: Recent Labs    02/17/20 1410  VITAMINB12 1,216*   Sepsis Labs: No results for input(s): PROCALCITON, LATICACIDVEN in the last 168 hours.  No results found for this or any previous visit (from the past 240 hour(s)).       Radiology Studies: MR BRAIN WO CONTRAST  Result Date: 02/18/2020 CLINICAL DATA:  Acute metabolic encephalopathy EXAM: MRI HEAD WITHOUT CONTRAST TECHNIQUE: Multiplanar, multiecho pulse sequences of the brain and surrounding structures were obtained without intravenous contrast. COMPARISON:  MRI 12/19/2016 FINDINGS: Brain: There is a small subcentimeter focus of mildly reduced diffusion in the left corona radiata adjacent to a chronic small vessel infarct. Confluent areas of T2 hyperintensity in the supratentorial white matter are nonspecific but may reflect advanced chronic microvascular ischemic changes. Chronic infarcts are identified including involvement of the right occipital lobe and right cerebellum. Scattered small foci of susceptibility hypointensity again identified within the cerebral white matter, left lentiform nucleus, pons, and cerebellum likely reflecting chronic microhemorrhages. Increase compared to the prior study may be on a technical basis. Prominent T2 hypointensity of  the basal ganglia likely reflects mineralization or other deposition. Prominence of the ventricles and sulci reflects moderate generalized parenchymal volume loss. Vascular: Major vessel flow voids at the skull base are preserved. Skull and upper cervical spine: Marrow signal is  mildly heterogeneous. No aggressive osseous lesion. Sinuses/Orbits: Paranasal sinuses are aerated. Right lens replacement. Other: Incidental note is made of a partially empty sella facet uremic suture. Mastoid air cells are clear. IMPRESSION: Small acute to subacute infarction of the left corona radiata. Chronic/nonemergent findings detailed above including infarcts, chronic microvascular ischemic changes, and microhemorrhages. Electronically Signed   By: Macy Mis M.D.   On: 02/18/2020 09:50        Scheduled Meds: . sodium chloride   Intravenous Once  . allopurinol  50 mg Oral Daily  . antiseptic oral rinse  15 mL Mouth Rinse BID  . atorvastatin  80 mg Oral q1800  . buPROPion  100 mg Oral TID  . carvedilol  25 mg Oral Q12H  . Chlorhexidine Gluconate Cloth  6 each Topical Q0600  . Chlorhexidine Gluconate Cloth  6 each Topical Q0600  . clopidogrel  75 mg Oral Daily  . darbepoetin (ARANESP) injection - NON-DIALYSIS  60 mcg Subcutaneous Q Thu-1800  . feeding supplement (NEPRO CARB STEADY)  237 mL Oral BID BM  . folic acid  1 mg Oral Daily  . insulin aspart  0-6 Units Subcutaneous TID WC  . lactulose  20 g Oral BID  . mirtazapine  7.5 mg Oral QHS  . multivitamin  1 tablet Oral QHS  . omega-3 acid ethyl esters  1 g Oral BID  . sodium chloride flush  3 mL Intravenous Q12H   Continuous Infusions: . sodium chloride Stopped (02/10/20 1409)  . ferumoxytol       LOS: 9 days   Time spent= 35 mins    Divon Krabill Arsenio Loader, MD [facility]  If 7PM-7AM, please contact night-coverage  02/19/2020, 1:19 PM

## 2020-02-19 NOTE — Progress Notes (Signed)
Nutrition Follow-up  DOCUMENTATION CODES:   Morbid obesity  INTERVENTION:   -Continue renal MVI daily -Continue nepro Shake po BID, each supplement provides 425 kcal and 19 grams protein -Continue Magic cup TID with meals, each supplement provides 290 kcal and 9 grams of protein -Feeding assistance with meals  NUTRITION DIAGNOSIS:   Inadequate oral intake related to lethargy/confusion as evidenced by meal completion < 50%.  Ongoing  GOAL:   Patient will meet greater than or equal to 90% of their needs  Progressing   MONITOR:   PO intake, Supplement acceptance, Diet advancement, Labs, Weight trends, Skin, I & O's  REASON FOR ASSESSMENT:   Consult Assessment of nutrition requirement/status, Diet education  ASSESSMENT:   69 year old female with history of DM-2, CAD s/p BMS to RCA, CVA, thrombocytopenia, diastolic CHF, CKD-5 with LUE aVF placed in 07/2019, PAD s/p left BKA presenting with progressive redness and edema of right lower extremity, and admitted for right lower extremity cellulitis, fluid overload and acute on chronic diastolic CHF.  Patient was started on IV vancomycin for cellulitis.    Developed AKI therefore nephrology team consulted initially started on Lasix and gentle hydration.  Renal function failed to improve therefore started on hemodialysis on 3/27.  Currently has right IJ temporary dialysis catheter in place.  3/26- s/p HD cath placement, HD initiated 3/28- s/p BSE- advanced to dysphagia 1 diet with nectar thick liquids 4/2- s/p MBSS- advanced to dysphagia 2 with thin liquids  Reviewed I/O's: +518 ml x 24 hours and +192 ml since admission   Pt sitting up recliner chair at time of visit. Pt alert, but speech was difficult to understand. She was mumbling throughout the visit, however, clearly stated "thank you" when housekeeper wiped off her tray table.   Noted meal completion 45-70%. Observed breakfast tray- pt consumed 50% of eggs and 100% of thickened  juices. Suspect intake will increase with wider variety of food selections.   Labs reviewed: K: 3.4, Mg: 1.6, CBGS: 142-188.   NUTRITION - FOCUSED PHYSICAL EXAM:    Most Recent Value  Orbital Region  Mild depletion  Upper Arm Region  Mild depletion  Thoracic and Lumbar Region  No depletion  Buccal Region  Mild depletion  Temple Region  Mild depletion  Clavicle Bone Region  No depletion  Clavicle and Acromion Bone Region  No depletion  Scapular Bone Region  No depletion  Dorsal Hand  Mild depletion  Patellar Region  Mild depletion  Anterior Thigh Region  Mild depletion  Posterior Calf Region  Mild depletion  Edema (RD Assessment)  Moderate  Hair  Reviewed  Eyes  Reviewed  Mouth  Reviewed  Skin  Reviewed  Nails  Reviewed       Diet Order:   Diet Order            Diet NPO time specified Except for: Sips with Meds  Diet effective midnight        DIET DYS 2 Room service appropriate? No; Fluid consistency: Thin  Diet effective now              EDUCATION NEEDS:   Not appropriate for education at this time  Skin:  Skin Assessment: Skin Integrity Issues: Skin Integrity Issues:: Other (Comment) Other: MASD to groin and buttocks  Last BM:  02/17/20  Height:   Ht Readings from Last 1 Encounters:  02/18/20 5\' 2"  (1.575 m)    Weight:   Wt Readings from Last 1 Encounters:  02/19/20 109.8 kg  Ideal Body Weight:  46.8 kg(adjusted for lt BKA)  BMI:  Body mass index is 44.26 kg/m.  Estimated Nutritional Needs:   Kcal:  1650-1850  Protein:  100-115 grams  Fluid:  1000 ml + UOP    Loistine Chance, RD, LDN, [place] Registered Dietitian II Certified Diabetes Care and Education Specialist Please refer to [facility] for RD and/or RD on-call/weekend/after hours pager

## 2020-02-19 NOTE — H&P (View-Only) (Signed)
Hospital Consult    Reason for Consult:  Permanent Dialysis Access Requesting Physician: Dr. Edrick Oh MRN #:  [id number]  History of Present Illness: This is a 70 y.o. 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 She presented to the ED due to right lower extremity cellulitis and fluid overload with CHF exacerbation. She is currently being dialyzed via a right IJ temporary catheter. She has a right brachiocephalic arteriovenous fistula that was placed by Dr. Carlis Abbott on 08/12/2019, this is functioning however deep in the arm and needs superficialization. She has history of a left brachial cephalic AV fistula by Dr. Scot Dock on 12/05/2014 at which time it was recommended she have an elevation because of small caliber of veins but patient did not wish to proceed at the time and subsequently was found to be occluded. We have been asked to see her for permanent Dialysis access.   Past Medical History:  Diagnosis Date  . Anemia   . Arthritis    "right shoulder" (12/19/2016)  . CAD S/P percutaneous coronary angioplasty 5/110    status post PCI to the RCA for inferior STEMI  . Cervical cancer ([place])   . Chronic diastolic heart failure, NYHA class 2 ([place])    Echocardiogram 06/2014:  Technically difficult study. Normal LV size with mild LVH. EF 55-60%. Moderate diastolic dysfunction, normal RV size and function. Likely aortic sclerosis without stenosis  . Chronic lower back pain   . CKD (chronic kidney disease), stage IV ([place])    Recent acute on chronic exacerbation in July 2014  . Depression   . Diabetes mellitus, type II, insulin dependent ([place])    With complications - CAD, CVA, peripheral ulcer ,  . Diabetic foot ulcer ([place])   . Diabetic peripheral neuropathy associated with type 2 diabetes mellitus ([place])   . Dry gangrene ([facility])    Left second toe; now  on Sole of L foot --  s/p L BKA  . History of blood transfusion    "w/hysterectomy"   . Hyperlipidemia LDL goal <70   . Hypertension associated with diabetes ([place])   . Obesity, Class III, BMI 40-49.9 (morbid obesity) ([facility])    BMI 43; 5' 2',  235 pounds 6.4 ounces  . On home oxygen therapy    "2.5L prn; sometimes as little as once/month" (12/19/2016)  . PAD (peripheral artery disease) ([place])     -- Most recent Dopplers April 2016 Status post left BKA. Known right PTA occlusion  . Pneumonia    "childhood"  . ST elevation myocardial infarction (STEMI) of inferior wall ([place])  03/2009   Ostial/proximal RCA occlusion treated with BMS stent  . Stroke/cerebrovascular accident ([place]) 03/18/2013; 12/19/2016   a. Right-sided weakness, mostly with balance issues and only mild weakness;; b. Slurring speach -- L Posterior Putamen CVA    Past Surgical History:  Procedure Laterality Date  . ABDOMINAL HYSTERECTOMY    . AMPUTATION Left 08/07/2013   Procedure: left midfoot amputation;  Surgeon: Marianna Payment, MD;  Location: [facility];  Service: Orthopedics;  Laterality: Left;  . AMPUTATION Left 08/09/2013   Procedure: AMPUTATION BELOW KNEE;  Surgeon: Marianna Payment, MD;  Location: [facility];  Service: Orthopedics;  Laterality: Left;  . AV FISTULA PLACEMENT Left 12/03/2014   Procedure: ARTERIOVENOUS (AV) FISTULA CREATION;  Surgeon: Angelia Mould, MD;  Location: [place];  Service: Vascular;  Laterality: Left;  .  AV FISTULA PLACEMENT Right 08/12/2019   Procedure: ARTERIOVENOUS (AV) FISTULA CREATION;  Surgeon: Marty Heck, MD;  Location: [place];  Service: Vascular;  Laterality: Right;  . CARDIAC CATHETERIZATION  03/31/2009   Proximal RCA thrombotic occlusion (inferior STEMI) other coronaries but in a codominant system  . EYE SURGERY     "bleeding; vessels leaking"  . [place]   "broke her leg in 2 places when she was young"  . IR FLUORO GUIDE CV LINE RIGHT  02/12/2020  . IR US GUIDE  VASC ACCESS RIGHT  02/12/2020  . LEG SURGERY     hole in bone( during Childhood)  . LEG TENDON SURGERY Right 2000s   patient fell @ [facility]  . PERCUTANEOUS CORONARY STENT INTERVENTION (PCI-S)  03/31/2009   PTCA , bare-metal stent 3.5 mm x 24 mm ostium of RCA ,EF 55%  . TRANSTHORACIC ECHOCARDIOGRAM  06/2014; 12/2016   a. Technically difficult study. Normal LV size with mild LVH. EF 55-60%. Mod - GR 2 DD. Nl RV size & fxn. ~ Aortic Sclerosis w/ stenosis;; b. LVEF 65-70%, GR 1-2 DD. No RWMA, Mod LA Dilation    No Known Allergies  Prior to Admission medications   Medication Sig Start Date End Date Taking? Authorizing Provider  atorvastatin (LIPITOR) 80 MG tablet Take 1 tablet (80 mg total) by mouth daily at 6 PM. 12/20/16  Yes Svalina, Estill Dooms, MD  buPROPion (WELLBUTRIN XL) 300 MG 24 hr tablet Take 300 mg by mouth daily.   Yes [provider]  carvedilol (COREG) 25 MG tablet Take 25 mg by mouth every 12 (twelve) hours.  11/23/14  Yes [provider]  clopidogrel (PLAVIX) 75 MG tablet Take 1 tablet (75 mg total) by mouth daily. 12/20/16  Yes Alphonzo Grieve, MD  colchicine 0.6 MG tablet Take 0.6 mg by mouth daily.  03/19/18  Yes [provider]  febuxostat (ULORIC) 40 MG tablet Take 40 mg by mouth daily. 01/06/20  Yes [provider]  furosemide (LASIX) 40 MG tablet Take 80 mg by mouth 2 (two) times daily.  08/10/14  Yes Verlee Monte, MD  insulin lispro (HUMALOG) 100 UNIT/ML KwikPen Inject 0-9 Units into the skin 3 (three) times daily.  08/14/19  Yes [provider]  Omega-3 Fatty Acids (FISH OIL) 1000 MG CAPS Take 1,000 mg by mouth 2 (two) times daily.   Yes [provider]  ezetimibe (ZETIA) 10 MG tablet Take 1 tablet (10 mg total) by mouth daily. Patient not taking: Reported on 02/09/2020 02/21/17 02/08/29  Leonie Man, MD  glucose blood (ACCU-CHEK AVIVA PLUS) test strip 1 each by Other route See admin instructions. Use one strip to check glucose before  meals and at bedtime. 06/18/14   [provider]  insulin aspart (NOVOLOG) 100 UNIT/ML injection Inject 0-9 Units into the skin 3 (three) times daily before meals. 0-9 Units, Subcutaneous, 3 times daily with meals CBG < 70: Implement Hypoglycemia measures CBG 70 - 120: 0 units CBG 121 - 150: 1 unit CBG 151 - 200: 2 units CBG 201 - 250: 3 units CBG 251 - 300: 5 units CBG 301 - 350: 7 units CBG 351 - 400: 9 units CBG > 400: call MD Patient not taking: Reported on 02/03/2020 08/16/19   Lavina Hamman, MD  Insulin Syringe-Needle U-100 (INSULIN SYRINGE .3CC/31GX5/16") 31G X 5/16" 0.3 ML MISC 1 Act by Does not apply route 4 (four) times daily -  before meals and at bedtime. Patient  taking differently: 1 each by Other route 4 (four) times daily -  before meals and at bedtime.  08/16/19   Lavina Hamman, MD  metaxalone (SKELAXIN) 800 MG tablet Take 0.5-1 tablets (400-800 mg total) by mouth 3 (three) times daily. Patient not taking: Reported on 02/03/2020 07/02/17   Lysbeth Penner, FNP  OXYGEN Inhale 2.5 L into the lungs as needed (Breathing.).     [provider]    Social History   Socioeconomic History  . Marital status: Widowed    Spouse name: Not on file  . Number of children: 4  . Years of education: Not on file  . Highest education level: Not on file  Occupational History    Employer: UNEMPLOYED  Tobacco Use  . Smoking status: Former Smoker    Quit date: 04/29/1995    Years since quitting: 24.8  . Smokeless tobacco: Never Used  Substance and Sexual Activity  . Alcohol use: No  . Drug use: No  . Sexual activity: Never  Other Topics Concern  . Not on file  Social History Narrative   Was previously living in an assisted living center while recovering from her stroke. Les[place] is now recently moved back home with her family.    She is attended by her daughter, who is super morbidly obese.   Social Determinants of Health   Financial Resource Strain:   . Difficulty of  Paying Living Expenses:   Food Insecurity:   . Worried About Charity fundraiser in the Last Year:   . Arboriculturist in the Last Year:   Transportation Needs:   . Film/video editor (Medical):   Marland Kitchen Lack of Transportation (Non-Medical):   Physical Activity:   . Days of Exercise per Week:   . Minutes of Exercise per Session:   Stress:   . Feeling of Stress :   Social Connections:   . Frequency of Communication with Friends and Family:   . Frequency of Social Gatherings with Friends and Family:   . Attends Religious Services:   . Active Member of Clubs or Organizations:   . Attends Archivist Meetings:   Marland Kitchen Marital Status:   Intimate Partner Violence:   . Fear of Current or Ex-Partner:   . Emotionally Abused:   Marland Kitchen Physically Abused:   . Sexually Abused:      Family History  Problem Relation Age of Onset  . Alcoholism Mother        Died from complications  . Alcoholism Father        Died from complications  . Cancer Father        Throat  . Pneumonia Father   . Diabetes Father   . Hypertension Father   . Heart disease Other        Unknown, unclear. Patient is not a good historian.  Essentially no pertinent history known    ROS: Otherwise negative unless mentioned in HPI  Physical Examination  Vitals:   02/19/20 0400 02/19/20 1212  BP: 136/61 (!) 122/47  Pulse: 70 63  Resp: 20 18  Temp: 98.9 F (37.2 C) (!) 97.3 F (36.3 C)  SpO2: 100% 100%   Body mass index is 44.26 kg/m.  General: chronically ill appearing, soft spoken but coherent, not in any distress Gait: Not observed HENT: WNL, normocephalic Pulmonary: normal non-labored breathing, without Rales, rhonchi,  wheezing Cardiac: regular, without  Murmurs, rubs or gallops; without carotid bruits Abdomen: obese, soft, NT/ND, no masses  Skin: without rashes Vascular Exam/Pulses: 1+ right radial pulse, palpable thrill at right AC in AV fistula, incision healed. Right hand warm. Left 2+ radial  pulse, previous Left AV fistula, healed incision. Left hand warm.  Extremities: without ischemic changes, without Gangrene , with cellulitis of the right lower extremity- dressings inplace; without open wounds; left below knee amputation stump intact and healed Musculoskeletal: no muscle wasting or atrophy  Neurologic: A&O X 3;  No focal weakness or paresthesias are detected; speech is fluent/normal Psychiatric:  The pt has Normal affect.   CBC    Component Value Date/Time   WBC 10.5 02/19/2020 0410   RBC 2.62 (L) 02/19/2020 0410   HGB 8.0 (L) 02/19/2020 0410   HCT 26.3 (L) 02/19/2020 0410   HCT 23.6 (L) 02/17/2020 1410   PLT 79 (L) 02/19/2020 0410   MCV 100.4 (H) 02/19/2020 0410   MCH 30.5 02/19/2020 0410   MCHC 30.4 02/19/2020 0410   RDW 16.3 (H) 02/19/2020 0410   LYMPHSABS 0.4 (L) 02/10/2020 0452   MONOABS 1.0 02/10/2020 0452   EOSABS 0.0 02/10/2020 0452   BASOSABS 0.0 02/10/2020 0452    BMET    Component Value Date/Time   NA 138 02/19/2020 0410   K 3.4 (L) 02/19/2020 0410   CL 100 02/19/2020 0410   CO2 28 02/19/2020 0410   GLUCOSE 151 (H) 02/19/2020 0410   BUN 16 02/19/2020 0410   CREATININE 2.46 (H) 02/19/2020 0410   CREATININE 3.51 (H) 03/14/2015 1425   CALCIUM 8.4 (L) 02/19/2020 0410   CALCIUM 9.6 06/06/2017 1218   GFRNONAA 19 (L) 02/19/2020 0410   GFRAA 23 (L) 02/19/2020 0410    COAGS: Lab Results  Component Value Date   INR 1.1 08/09/2019   INR 1.03 12/19/2016   INR 1.09 03/06/2015   Non-invasive Vascular studies:  02/10/20   Findings:  +--------------------+----------+-----------------+--------+  AVF         PSV (cm/s)Flow Vol (mL/min)Comments  +--------------------+----------+-----------------+--------+  Native artery inflow  260      917          +--------------------+----------+-----------------+--------+  AVF Anastomosis     257                  +--------------------+----------+-----------------+--------+     +------------+----------+-------------+----------+-------------------------  ----+  OUTFLOW VEINPSV (cm/s)Diameter (cm)Depth (cm)     Describe        +------------+----------+-------------+----------+-------------------------  ----+  Prox UA     255    0.71     0.35     competing branch      +------------+----------+-------------+----------+-------------------------  ----+  Mid UA     170    0.68     0.70     competing branch      +------------+----------+-------------+----------+-------------------------  ----+  Dist UA     504    0.47     0.27  partially-occlusive  thrombus                            and competing branch     +------------+----------+-------------+----------+-------------------------  ----+     Summary:  Arteriovenous fistula-Thrombus and multiple branches noted.   Statin:  Yes.   Beta Blocker:  Yes Aspirin:  No. ACEI:  No. ARB:  No. CCB use:  Yes Other antiplatelets/anticoagulants:  Yes.   plavix   ASSESSMENT/PLAN: This is a 69 y.o. female with acute on chronic kidney disease now ESRD needing permanent dialysis access. She has a functioning  RUE AV fistula that has some thrombus present and also would require an elevation. She is currently being dialyzed by a temporary Catheter. I have discussed the risks/benefits with the patient as well as with her daughters over the phone and in person.I had a long conversation with her daughters and they are agreeable to proceed. They have signed the consent on the patients behalf due to her clinical condition. She is scheduled to have a  Tunneled dialysis catheter tomorrow by Dr. Donnetta Hutching. She will need to be NPO after midnight. She will have her dialysis after her procedure tomorrow morning. I have discussed with family that long term options  for further surgeries on her fistula vs graft can be discussed as an outpatient   [facility] [phone] 02/19/2020  12:44 PM

## 2020-02-19 NOTE — Significant Event (Signed)
Patient is experience weak dry cough after taking crushed med's in pudding. Refused lactulose and Glucerna.

## 2020-02-19 NOTE — Progress Notes (Signed)
Pt alert and oriented x3 (person, place, time) upon return from HD. Pt hungry and asking for chicken and spanish rice. Pt recalls receiving a blood transfusion earlier today. Pt given a pureed snack with nectar thick iced tea and took her meds in 2 spoonfuls of chocolate pudding. Pt refused Lactulose again. Family member Chief Operating Officer) called this RN and was updated about pt's orientation status.

## 2020-02-19 NOTE — Progress Notes (Signed)
[facility] ROUNDING NOTE   Subjective:   This is 69 year old lady with history of diabetes coronary artery disease CVA thrombocytopenia diastolic heart failure CKD stage V, now end-stage renal disease on dialysis.  History of left upper arm extremity AV fistula placed 07/2019, PAD status post left BKA fluid overload and right lower extremity cellulitis admitted 02/09/2020.  Thought to have uremic encephalopathy contributing to lethargy.  Was initiated on hemodialysis.   Dialysis #4. 02/18/2020 removal 1.8 L status post transfusion 1 unit packed red blood cells. Appreciate assistance from palliative medicine. Next dialysis will be 02/20/2020  Blood pressure 136/61 pulse 71 temperature 98.9 O2 sats 100% 2.5 L  Sodium 138 potassium 3.4 chloride 100 CO2 28 BUN 16 creatinine 2.46 glucose 151 calcium 8.4 magnesium 1.6 albumin 2.0 AST 11 ALT 14. Hemoglobin 8 platelets 79  Allopurinol 50 mg daily atorvastatin 80 mg daily Wellbutrin 100 mg 3 times daily Coreg 25 mg every 12 hours Plavix 75 mg daily darbepoetin 60 mcg every Thursday doxycycline 100 mg every 12 hours, insulin sliding scale  Objective:  Vital signs in last 24 hours:  Temp:  [97.8 F (36.6 C)-98.9 F (37.2 C)] 98.9 F (37.2 C) (04/02 0400) Pulse Rate:  [60-70] 70 (04/02 0400) Resp:  [15-20] 20 (04/02 0400) BP: (100-136)/(48-61) 136/61 (04/02 0400) SpO2:  [98 %-100 %] 100 % (04/02 0400) Weight:  [109.8 kg-120.2 kg] 109.8 kg (04/02 0400)  Weight change: -4.082 kg Filed Weights   02/18/20 1600 02/18/20 1830 02/19/20 0400  Weight: 120 kg 118.4 kg 109.8 kg    Intake/Output: I/O last 3 completed shifts: In: 2397 [P.O.:195; Blood:2202] Out: 5176 [Other:1819]   Intake/Output this shift:  No intake/output data recorded.  Gen: Awake and comfortable nondistressed nonverbal CVS: Pulse regular rhythm, normal rate, S1 and S2 normal.  Right IJ hemodialysis catheter Resp: Poor inspiratory effort with decreased breath sounds  over bases, no rales Abd: Soft, flat, nontender Ext: Right leg in Ace wrap, trace edema left ankle.  Short pulsatile right upper arm AVF   Basic Metabolic Panel: Recent Labs  Lab 02/13/20 0521 02/13/20 0521 02/14/20 0722 02/14/20 0722 02/15/20 0554 02/15/20 1357 02/15/20 1357 02/16/20 0506 02/17/20 0443 02/18/20 0511 02/19/20 0410  NA 142   < > 143  --   --  141  --   --  135 142 138  K 3.9   < > 3.6  --   --  3.8  --   --  3.6 4.0 3.4*  CL 108   < > 108  --   --  103  --   --  100 103 100  CO2 20*   < > 22  --   --  23  --   --  26 25 28   GLUCOSE 205*   < > 109*  --   --  143*  --   --  170* 156* 151*  BUN 101*   < > 57*  --   --  61*  --   --  26* 39* 16  CREATININE 5.06*   < > 3.40*  --   --  3.76*  --   --  2.68* 4.19* 2.46*  CALCIUM 8.7*   < > 8.8*   < >  --  8.5*   < >  --  8.2* 8.7* 8.4*  MG 2.0   < > 1.7  --  1.9  --   --  1.7  --  1.8 1.6*  PHOS 3.8  --  2.5  --   --  2.7  --   --   --   --   --    < > = values in this interval not displayed.    Liver Function Tests: Recent Labs  Lab 02/14/20 0722 02/15/20 1357 02/17/20 0443 02/18/20 0511 02/19/20 0410  AST  --   --  14* 12* 11*  ALT  --   --  19 18 14   ALKPHOS  --   --  109 95 99  BILITOT  --   --  1.6* 1.2 1.3*  PROT  --   --  5.4* 5.4* 5.7*  ALBUMIN 2.3* 2.2* 2.0* 1.9* 2.0*   No results for input(s): LIPASE, AMYLASE in the last 168 hours. Recent Labs  Lab 02/13/20 0521 02/17/20 0443  AMMONIA 42* 19    CBC: Recent Labs  Lab 02/16/20 0506 02/16/20 0506 02/16/20 1230 02/16/20 1230 02/17/20 0443 02/17/20 1410 02/18/20 0511 02/18/20 2352 02/19/20 0410  WBC 11.5*  --  11.3*  --  11.5*  --  11.2*  --  10.5  HGB 7.6*   < > 7.5*  --  7.3*  --  7.3* 8.0* 8.0*  HCT 25.2*   < > 25.1*   < > 24.4* 23.6* 24.8* 25.8* 26.3*  MCV 102.0*  --  103.3*  --  102.5*  --  104.2*  --  100.4*  PLT 92*  --  93*  --  84*  --  90*  --  79*   < > = values in this interval not displayed.    Cardiac Enzymes: No  results for input(s): CKTOTAL, CKMB, CKMBINDEX, TROPONINI in the last 168 hours.  BNP: Invalid input(s): POCBNP  CBG: Recent Labs  Lab 02/17/20 2012 02/18/20 0613 02/18/20 1124 02/18/20 2226 02/19/20 0623  GLUCAP 157* 154* 169* 133* 142*    Microbiology: Results for orders placed or performed during the hospital encounter of 02/09/20  Blood culture (routine x 2)     Status: None   Collection Time: 02/09/20  1:25 AM   Specimen: BLOOD LEFT ARM  Result Value Ref Range Status   Specimen Description BLOOD LEFT ARM  Final   Special Requests   Final    BOTTLES DRAWN AEROBIC AND ANAEROBIC Blood Culture adequate volume   Culture   Final    NO GROWTH 5 DAYS Performed at [facility], [place] [address], [place], [place] [address]    Report Status 02/14/2020 FINAL  Final  Respiratory Panel by RT PCR (Flu A&B, Covid) - Nasopharyngeal Swab     Status: None   Collection Time: 02/09/20  3:08 AM   Specimen: Nasopharyngeal Swab  Result Value Ref Range Status   SARS Coronavirus 2 by RT PCR NEGATIVE NEGATIVE Final    Comment: (NOTE) SARS-CoV-2 target nucleic acids are NOT DETECTED. The SARS-CoV-2 RNA is generally detectable in upper respiratoy specimens during the acute phase of infection. The lowest concentration of SARS-CoV-2 viral copies this assay can detect is 131 copies/mL. A negative result does not preclude SARS-Cov-2 infection and should not be used as the sole basis for treatment or other patient management decisions. A negative result may occur with  improper specimen collection/handling, submission of specimen other than nasopharyngeal swab, presence of viral mutation(s) within the areas targeted by this assay, and inadequate number of viral copies (<131 copies/mL). A negative result must be combined with clinical observations, patient history, and epidemiological information. The expected result is Negative. Fact Sheet for  Patients:   PinkCheek.be Fact Sheet for Healthcare Providers:  GravelBags.it This test is not yet ap proved or cleared by the [place] and  has been authorized for detection and/or diagnosis of SARS-CoV-2 by FDA under an Emergency Use Authorization (EUA). This EUA will remain  in effect (meaning this test can be used) for the duration of the COVID-19 declaration under Section 564(b)(1) of the Act, 21 U.S.C. section 360bbb-3(b)(1), unless the authorization is terminated or revoked sooner.    Influenza A by PCR NEGATIVE NEGATIVE Final   Influenza B by PCR NEGATIVE NEGATIVE Final    Comment: (NOTE) The Xpert Xpress SARS-CoV-2/FLU/RSV assay is intended as an aid in  the diagnosis of influenza from Nasopharyngeal swab specimens and  should not be used as a sole basis for treatment. Nasal washings and  aspirates are unacceptable for Xpert Xpress SARS-CoV-2/FLU/RSV  testing. Fact Sheet for Patients: PinkCheek.be Fact Sheet for Healthcare Providers: GravelBags.it This test is not yet approved or cleared by the [place] FDA and  has been authorized for detection and/or diagnosis of SARS-CoV-2 by  FDA under an Emergency Use Authorization (EUA). This EUA will remain  in effect (meaning this test can be used) for the duration of the  Covid-19 declaration under Section 564(b)(1) of the Act, 21  U.S.C. section 360bbb-3(b)(1), unless the authorization is  terminated or revoked. Performed at [facility], [place] [address] [place], [place] [address]   Blood culture (routine x 2)     Status: None   Collection Time: 02/09/20  3:15 AM   Specimen: BLOOD LEFT HAND  Result Value Ref Range Status   Specimen Description BLOOD LEFT HAND  Final   Special Requests   Final    BOTTLES DRAWN AEROBIC ONLY Blood Culture results may not be optimal due to an inadequate volume of blood  received in culture bottles   Culture   Final    NO GROWTH 5 DAYS Performed at [facility], [place] [address] [place], [place] [address]    Report Status 02/14/2020 FINAL  Final    Coagulation Studies: No results for input(s): LABPROT, INR in the last 72 hours.  Urinalysis: No results for input(s): COLORURINE, LABSPEC, PHURINE, GLUCOSEU, HGBUR, BILIRUBINUR, KETONESUR, PROTEINUR, UROBILINOGEN, NITRITE, LEUKOCYTESUR in the last 72 hours.  Invalid input(s): APPERANCEUR    Imaging: MR BRAIN WO CONTRAST  Result Date: 02/18/2020 CLINICAL DATA:  Acute metabolic encephalopathy EXAM: MRI HEAD WITHOUT CONTRAST TECHNIQUE: Multiplanar, multiecho pulse sequences of the brain and surrounding structures were obtained without intravenous contrast. COMPARISON:  MRI 12/19/2016 FINDINGS: Brain: There is a small subcentimeter focus of mildly reduced diffusion in the left corona radiata adjacent to a chronic small vessel infarct. Confluent areas of T2 hyperintensity in the supratentorial white matter are nonspecific but may reflect advanced chronic microvascular ischemic changes. Chronic infarcts are identified including involvement of the right occipital lobe and right cerebellum. Scattered small foci of susceptibility hypointensity again identified within the cerebral white matter, left lentiform nucleus, pons, and cerebellum likely reflecting chronic microhemorrhages. Increase compared to the prior study may be on a technical basis. Prominent T2 hypointensity of the basal ganglia likely reflects mineralization or other deposition. Prominence of the ventricles and sulci reflects moderate generalized parenchymal volume loss. Vascular: Major vessel flow voids at the skull base are preserved. Skull and upper cervical spine: Marrow signal is mildly heterogeneous. No aggressive osseous lesion. Sinuses/Orbits: Paranasal sinuses are aerated. Right lens replacement. Other: Incidental note is made of  a partially empty  sella facet uremic suture. Mastoid air cells are clear. IMPRESSION: Small acute to subacute infarction of the left corona radiata. Chronic/nonemergent findings detailed above including infarcts, chronic microvascular ischemic changes, and microhemorrhages. Electronically Signed   By: Macy Mis M.D.   On: 02/18/2020 09:50     Medications:   . sodium chloride Stopped (02/10/20 1409)  . ferumoxytol     . sodium chloride   Intravenous Once  . allopurinol  50 mg Oral Daily  . antiseptic oral rinse  15 mL Mouth Rinse BID  . atorvastatin  80 mg Oral q1800  . buPROPion  100 mg Oral TID  . carvedilol  25 mg Oral Q12H  . Chlorhexidine Gluconate Cloth  6 each Topical Q0600  . clopidogrel  75 mg Oral Daily  . darbepoetin (ARANESP) injection - NON-DIALYSIS  60 mcg Subcutaneous Q Thu-1800  . feeding supplement (NEPRO CARB STEADY)  237 mL Oral BID BM  . folic acid  1 mg Oral Daily  . insulin aspart  0-6 Units Subcutaneous TID WC  . lactulose  20 g Oral BID  . mirtazapine  7.5 mg Oral QHS  . multivitamin  1 tablet Oral QHS  . omega-3 acid ethyl esters  1 g Oral BID  . sodium chloride flush  3 mL Intravenous Q12H   sodium chloride, acetaminophen **OR** acetaminophen, heparin, ondansetron **OR** ondansetron (ZOFRAN) IV, Resource ThickenUp Clear, sodium chloride flush  Assessment/ Plan:  1.  End-stage renal disease: Remains lethargic despite sequential dialysis.  She is now end-stage renal disease.  Started on dialysis via temporary IJ dialysis catheter placed by IR after RUA AVF deemed unusable;  .  She will need conversion to [facility] and placement of permanent access at some point.  Patient continues to be lethargic may be slightly improved after dialysis she was hungry and appropriate.  We will plan next dialysis treatment 02/20/2020. Recommend palliative medicine involvement. She will need conversion of temporary IJ to [facility] and will ask [facility] to take a look at her. 2.  Right lower extremity cellulitis:  Clinically improving on oral doxycycline.  With [employer]. 3.  History of congestive heart failure with diastolic dysfunction: Currently appears to be compensated with dialysis started yesterday.  Leave off of diuretics and continue to monitor on dialysis. 4.  Anemia: Downtrending hemoglobin and hematocrit, with iron deficiency and on ESA. 5.  Secondary hyperparathyroidism: Calcium and phosphorus levels currently at goal, poor oral intake and not on binders   LOS: [place] @TODAY @9 :29 AM

## 2020-02-19 NOTE — Progress Notes (Signed)
Physical Therapy Treatment Patient Details Name: [name] MRN: [id number] DOB: [birth year] Today's Date: 02/19/2020    History of Present Illness Pt is a 69 y/o female admitted 02/09/20 secondary to RLE cellulitis and volume overload. PMH includes CAD s/p stent, DM, CKD, CVA, dCHF, L BKA.    PT Comments    Pt admitted with above diagnosis. Pt needing max assist to roll. Pt not assisting much at all.  Assisted pt to chair via [place] with pt tolerating lift well.   Pt currently with functional limitations due to balance and endurance deficits. Pt will benefit from skilled PT to increase their independence and safety with mobility to allow discharge to the venue listed below.     Follow Up Recommendations  SNF;Supervision/Assistance - 24 hour     Equipment Recommendations  Hospital bed;Other (comment)(hoyer lift, amputee lift pad)    Recommendations for Other Services       Precautions / Restrictions Precautions Precautions: Fall;Other (comment) Precaution Comments: RLE unna boot (painful LE), L BKA at baseline Restrictions Weight Bearing Restrictions: No    Mobility  Bed Mobility Overal bed mobility: Needs Assistance Bed Mobility: Supine to Sit Rolling: Total assist;Max assist         General bed mobility comments: multimodal cues for sequencing; hand over hand assist to reach for rail  Transfers Overall transfer level: Needs assistance               General transfer comment: Used MAximove to get pt to chair. Pt positioned in chair well.   Ambulation/Gait                 Stairs             Wheelchair Mobility    Modified Rankin (Stroke Patients Only)       Balance                                            Cognition Arousal/Alertness: Awake/alert Behavior During Therapy: Flat affect Overall Cognitive Status: No family/caregiver present to determine baseline cognitive functioning                                  General Comments: very soft spoken and mostly mouthing words; pt follows single step cues inconsistently and with increased time      Exercises General Exercises - Lower Extremity Long Arc Quad: AROM;5 reps;Supine;Both    General Comments General comments (skin integrity, edema, etc.): Pt on 1LNC      Pertinent Vitals/Pain Pain Assessment: No/denies pain    Home Living                      Prior Function            PT Goals (current goals can now be found in the care plan section) Progress towards PT goals: Progressing toward goals    Frequency    Min 2X/week      PT Plan Current plan remains appropriate    Co-evaluation              AM-PAC PT "6 Clicks" Mobility   Outcome Measure  Help needed turning from your back to your side while in a flat bed without using bedrails?: Total Help needed moving from lying on your back to  sitting on the side of a flat bed without using bedrails?: Total Help needed moving to and from a bed to a chair (including a wheelchair)?: Total Help needed standing up from a chair using your arms (e.g., wheelchair or bedside chair)?: Total Help needed to walk in hospital room?: Total Help needed climbing 3-5 steps with a railing? : Total 6 Click Score: 6    End of Session Equipment Utilized During Treatment: Oxygen Activity Tolerance: Patient tolerated treatment well Patient left: with call bell/phone within reach;in chair;with chair alarm set Nurse Communication: Mobility status;Need for lift equipment PT Visit Diagnosis: Difficulty in walking, not elsewhere classified (R26.2);Muscle weakness (generalized) (M62.81)     Time: 0093-8182 PT Time Calculation (min) (ACUTE ONLY): 21 min  Charges:  $Therapeutic Activity: 8-22 mins                     Chrstopher Malenfant W,PT Acute Rehabilitation Services Pager:  [phone]  Office:  [place] 02/19/2020, 1:02 PM

## 2020-02-19 NOTE — Consult Note (Signed)
Hospital Consult    Reason for Consult:  Permanent Dialysis Access Requesting Physician: Dr. Edrick Oh MRN #:  [id number]  History of Present Illness: This is a 69 y.o. 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 She presented to the ED due to right lower extremity cellulitis and fluid overload with CHF exacerbation. She is currently being dialyzed via a right IJ temporary catheter. She has a right brachiocephalic arteriovenous fistula that was placed by Dr. Carlis Abbott on 08/12/2019, this is functioning however deep in the arm and needs superficialization. She has history of a left brachial cephalic AV fistula by Dr. Scot Dock on 12/05/2014 at which time it was recommended she have an elevation because of small caliber of veins but patient did not wish to proceed at the time and subsequently was found to be occluded. We have been asked to see her for permanent Dialysis access.   Past Medical History:  Diagnosis Date  . Anemia   . Arthritis    "right shoulder" (12/19/2016)  . CAD S/P percutaneous coronary angioplasty 5/110    status post PCI to the RCA for inferior STEMI  . Cervical cancer ([place])   . Chronic diastolic heart failure, NYHA class 2 ([place])    Echocardiogram 06/2014:  Technically difficult study. Normal LV size with mild LVH. EF 55-60%. Moderate diastolic dysfunction, normal RV size and function. Likely aortic sclerosis without stenosis  . Chronic lower back pain   . CKD (chronic kidney disease), stage IV ([place])    Recent acute on chronic exacerbation in July 2014  . Depression   . Diabetes mellitus, type II, insulin dependent ([place])    With complications - CAD, CVA, peripheral ulcer ,  . Diabetic foot ulcer ([place])   . Diabetic peripheral neuropathy associated with type 2 diabetes mellitus ([place])   . Dry gangrene ([facility])    Left second toe; now  on Sole of L foot --  s/p L BKA  . History of blood transfusion    "w/hysterectomy"   . Hyperlipidemia LDL goal <70   . Hypertension associated with diabetes ([place])   . Obesity, Class III, BMI 40-49.9 (morbid obesity) ([facility])    BMI 43; 5' 2',  235 pounds 6.4 ounces  . On home oxygen therapy    "2.5L prn; sometimes as little as once/month" (12/19/2016)  . PAD (peripheral artery disease) ([place])     -- Most recent Dopplers April 2016 Status post left BKA. Known right PTA occlusion  . Pneumonia    "childhood"  . ST elevation myocardial infarction (STEMI) of inferior wall ([place])  03/2009   Ostial/proximal RCA occlusion treated with BMS stent  . Stroke/cerebrovascular accident ([place]) 03/18/2013; 12/19/2016   a. Right-sided weakness, mostly with balance issues and only mild weakness;; b. Slurring speach -- L Posterior Putamen CVA    Past Surgical History:  Procedure Laterality Date  . ABDOMINAL HYSTERECTOMY    . AMPUTATION Left 08/07/2013   Procedure: left midfoot amputation;  Surgeon: Marianna Payment, MD;  Location: [facility];  Service: Orthopedics;  Laterality: Left;  . AMPUTATION Left 08/09/2013   Procedure: AMPUTATION BELOW KNEE;  Surgeon: Marianna Payment, MD;  Location: [facility];  Service: Orthopedics;  Laterality: Left;  . AV FISTULA PLACEMENT Left 12/03/2014   Procedure: ARTERIOVENOUS (AV) FISTULA CREATION;  Surgeon: Angelia Mould, MD;  Location: [facility] [facility];  Service: Vascular;  Laterality: Left;  .  AV FISTULA PLACEMENT Right 08/12/2019   Procedure: ARTERIOVENOUS (AV) FISTULA CREATION;  Surgeon: Marty Heck, MD;  Location: [facility];  Service: Vascular;  Laterality: Right;  . CARDIAC CATHETERIZATION  03/31/2009   Proximal RCA thrombotic occlusion (inferior STEMI) other coronaries but in a codominant system  . EYE SURGERY     "bleeding; vessels leaking"  . [place]   "broke her leg in 2 places when she was young"  . IR FLUORO GUIDE CV LINE RIGHT  02/12/2020  . IR US GUIDE  VASC ACCESS RIGHT  02/12/2020  . LEG SURGERY     hole in bone( during Childhood)  . LEG TENDON SURGERY Right 2000s   patient fell @ [facility]  . PERCUTANEOUS CORONARY STENT INTERVENTION (PCI-S)  03/31/2009   PTCA , bare-metal stent 3.5 mm x 24 mm ostium of RCA ,EF 55%  . TRANSTHORACIC ECHOCARDIOGRAM  06/2014; 12/2016   a. Technically difficult study. Normal LV size with mild LVH. EF 55-60%. Mod - GR 2 DD. Nl RV size & fxn. ~ Aortic Sclerosis w/ stenosis;; b. LVEF 65-70%, GR 1-2 DD. No RWMA, Mod LA Dilation    No Known Allergies  Prior to Admission medications   Medication Sig Start Date End Date Taking? Authorizing Provider  atorvastatin (LIPITOR) 80 MG tablet Take 1 tablet (80 mg total) by mouth daily at 6 PM. 12/20/16  Yes Svalina, Estill Dooms, MD  buPROPion (WELLBUTRIN XL) 300 MG 24 hr tablet Take 300 mg by mouth daily.   Yes [provider]  carvedilol (COREG) 25 MG tablet Take 25 mg by mouth every 12 (twelve) hours.  11/23/14  Yes [provider]  clopidogrel (PLAVIX) 75 MG tablet Take 1 tablet (75 mg total) by mouth daily. 12/20/16  Yes Alphonzo Grieve, MD  colchicine 0.6 MG tablet Take 0.6 mg by mouth daily.  03/19/18  Yes [provider]  febuxostat (ULORIC) 40 MG tablet Take 40 mg by mouth daily. 01/06/20  Yes [provider]  furosemide (LASIX) 40 MG tablet Take 80 mg by mouth 2 (two) times daily.  08/10/14  Yes Verlee Monte, MD  insulin lispro (HUMALOG) 100 UNIT/ML KwikPen Inject 0-9 Units into the skin 3 (three) times daily.  08/14/19  Yes [provider]  Omega-3 Fatty Acids (FISH OIL) 1000 MG CAPS Take 1,000 mg by mouth 2 (two) times daily.   Yes [provider]  ezetimibe (ZETIA) 10 MG tablet Take 1 tablet (10 mg total) by mouth daily. Patient not taking: Reported on 02/09/2020 02/21/17 02/08/29  [place]ie Man, MD  glucose blood (ACCU-CHEK AVIVA PLUS) test strip 1 each by Other route See admin instructions. Use one strip to check glucose before  meals and at bedtime. 06/18/14   [provider]  insulin aspart (NOVOLOG) 100 UNIT/ML injection Inject 0-9 Units into the skin 3 (three) times daily before meals. 0-9 Units, Subcutaneous, 3 times daily with meals CBG < 70: Implement Hypoglycemia measures CBG 70 - 120: 0 units CBG 121 - 150: 1 unit CBG 151 - 200: 2 units CBG 201 - 250: 3 units CBG 251 - 300: 5 units CBG 301 - 350: 7 units CBG 351 - 400: 9 units CBG > 400: call MD Patient not taking: Reported on 02/03/2020 08/16/19   Lavina Hamman, MD  Insulin Syringe-Needle U-100 (INSULIN SYRINGE .3CC/31GX5/16") 31G X 5/16" 0.3 ML MISC 1 Act by Does not apply route 4 (four) times daily -  before meals and at bedtime. Patient  taking differently: 1 each by Other route 4 (four) times daily -  before meals and at bedtime.  08/16/19   Lavina Hamman, MD  metaxalone (SKELAXIN) 800 MG tablet Take 0.5-1 tablets (400-800 mg total) by mouth 3 (three) times daily. Patient not taking: Reported on 02/03/2020 07/02/17   Lysbeth Penner, FNP  OXYGEN Inhale 2.5 L into the lungs as needed (Breathing.).     [provider]    Social History   Socioeconomic History  . Marital status: Widowed    Spouse name: Not on file  . Number of children: 4  . Years of education: Not on file  . Highest education level: Not on file  Occupational History    Employer: UNEMPLOYED  Tobacco Use  . Smoking status: Former Smoker    Quit date: 04/29/1995    Years since quitting: 24.8  . Smokeless tobacco: Never Used  Substance and Sexual Activity  . Alcohol use: No  . Drug use: No  . Sexual activity: Never  Other Topics Concern  . Not on file  Social History Narrative   Was previously living in an assisted living center while recovering from her stroke. Les[place] is now recently moved back home with her family.    She is attended by her daughter, who is super morbidly obese.   Social Determinants of Health   Financial Resource Strain:   . Difficulty of  Paying Living Expenses:   Food Insecurity:   . Worried About Charity fundraiser in the Last Year:   . Arboriculturist in the Last Year:   Transportation Needs:   . Film/video editor (Medical):   Marland Kitchen Lack of Transportation (Non-Medical):   Physical Activity:   . Days of Exercise per Week:   . Minutes of Exercise per Session:   Stress:   . Feeling of Stress :   Social Connections:   . Frequency of Communication with Friends and Family:   . Frequency of Social Gatherings with Friends and Family:   . Attends Religious Services:   . Active Member of Clubs or Organizations:   . Attends Archivist Meetings:   Marland Kitchen Marital Status:   Intimate Partner Violence:   . Fear of Current or Ex-Partner:   . Emotionally Abused:   Marland Kitchen Physically Abused:   . Sexually Abused:      Family History  Problem Relation Age of Onset  . Alcoholism Mother        Died from complications  . Alcoholism Father        Died from complications  . Cancer Father        Throat  . Pneumonia Father   . Diabetes Father   . Hypertension Father   . Heart disease Other        Unknown, unclear. Patient is not a good historian.  Essentially no pertinent history known    ROS: Otherwise negative unless mentioned in HPI  Physical Examination  Vitals:   02/19/20 0400 02/19/20 1212  BP: 136/61 (!) 122/47  Pulse: 70 63  Resp: 20 18  Temp: 98.9 F (37.2 C) (!) 97.3 F (36.3 C)  SpO2: 100% 100%   Body mass index is 44.26 kg/m.  General: chronically ill appearing, soft spoken but coherent, not in any distress Gait: Not observed HENT: WNL, normocephalic Pulmonary: normal non-labored breathing, without Rales, rhonchi,  wheezing Cardiac: regular, without  Murmurs, rubs or gallops; without carotid bruits Abdomen: obese, soft, NT/ND, no masses  Skin: without rashes Vascular Exam/Pulses: 1+ right radial pulse, palpable thrill at right AC in AV fistula, incision healed. Right hand warm. Left 2+ radial  pulse, previous Left AV fistula, healed incision. Left hand warm.  Extremities: without ischemic changes, without Gangrene , with cellulitis of the right lower extremity- dressings inplace; without open wounds; left below knee amputation stump intact and healed Musculoskeletal: no muscle wasting or atrophy  Neurologic: A&O X 3;  No focal weakness or paresthesias are detected; speech is fluent/normal Psychiatric:  The pt has Normal affect.   CBC    Component Value Date/Time   WBC 10.5 02/19/2020 0410   RBC 2.62 (L) 02/19/2020 0410   HGB 8.0 (L) 02/19/2020 0410   HCT 26.3 (L) 02/19/2020 0410   HCT 23.6 (L) 02/17/2020 1410   PLT 79 (L) 02/19/2020 0410   MCV 100.4 (H) 02/19/2020 0410   MCH 30.5 02/19/2020 0410   MCHC 30.4 02/19/2020 0410   RDW 16.3 (H) 02/19/2020 0410   LYMPHSABS 0.4 (L) 02/10/2020 0452   MONOABS 1.0 02/10/2020 0452   EOSABS 0.0 02/10/2020 0452   BASOSABS 0.0 02/10/2020 0452    BMET    Component Value Date/Time   NA 138 02/19/2020 0410   K 3.4 (L) 02/19/2020 0410   CL 100 02/19/2020 0410   CO2 28 02/19/2020 0410   GLUCOSE 151 (H) 02/19/2020 0410   BUN 16 02/19/2020 0410   CREATININE 2.46 (H) 02/19/2020 0410   CREATININE 3.51 (H) 03/14/2015 1425   CALCIUM 8.4 (L) 02/19/2020 0410   CALCIUM 9.6 06/06/2017 1218   GFRNONAA 19 (L) 02/19/2020 0410   GFRAA 23 (L) 02/19/2020 0410    COAGS: Lab Results  Component Value Date   INR 1.1 08/09/2019   INR 1.03 12/19/2016   INR 1.09 03/06/2015   Non-invasive Vascular studies:  02/10/20   Findings:  +--------------------+----------+-----------------+--------+  AVF         PSV (cm/s)Flow Vol (mL/min)Comments  +--------------------+----------+-----------------+--------+  Native artery inflow  260      917          +--------------------+----------+-----------------+--------+  AVF Anastomosis     257                   +--------------------+----------+-----------------+--------+     +------------+----------+-------------+----------+-------------------------  ----+  OUTFLOW VEINPSV (cm/s)Diameter (cm)Depth (cm)     Describe        +------------+----------+-------------+----------+-------------------------  ----+  Prox UA     255    0.71     0.35     competing branch      +------------+----------+-------------+----------+-------------------------  ----+  Mid UA     170    0.68     0.70     competing branch      +------------+----------+-------------+----------+-------------------------  ----+  Dist UA     504    0.47     0.27  partially-occlusive  thrombus                            and competing branch     +------------+----------+-------------+----------+-------------------------  ----+     Summary:  Arteriovenous fistula-Thrombus and multiple branches noted.   Statin:  Yes.   Beta Blocker:  Yes Aspirin:  No. ACEI:  No. ARB:  No. CCB use:  Yes Other antiplatelets/anticoagulants:  Yes.   plavix   ASSESSMENT/PLAN: This is a 69 y.o. female with acute on chronic kidney disease now ESRD needing permanent dialysis access. She has a  functioning RUE AV fistula that has some thrombus present and also would require an elevation. She is currently being dialyzed by a temporary Catheter. I have discussed the risks/benefits with the patient as well as with her daughters over the phone and in person.I had a long conversation with her daughters and they are agreeable to proceed. They have signed the consent on the patients behalf due to her clinical condition. She is scheduled to have a  Tunneled dialysis catheter tomorrow by Dr. Donnetta Hutching. She will need to be NPO after midnight. She will have her dialysis after her procedure tomorrow morning. I have discussed with family that long term options  for further surgeries on her fistula vs graft can be discussed as an outpatient   [facility] [phone] 02/19/2020  12:44 PM

## 2020-02-19 NOTE — Progress Notes (Signed)
Modified Barium Swallow Progress Note  Patient Details  Name: [name] MRN: [id number] Date of Birth: 1951-11-13  Today's Date: 02/19/2020  Modified Barium Swallow completed.  Full report located under Chart Review in the Imaging Section.  Brief recommendations include the following:  Clinical Impression  Pt presents with mild oropharyngeal dysphagia characterized by impaired A-P transport, reduced lingual retraction, impaired bolus cohesion, and a pharyngeal delay. She demonstrated premature spillage to the level of the valleculae, vallecular residue, and occasional transient penetration (PAS 2) with thin liquids. A dyspahgia 2 diet with thin liquids is recommended at this time and SLP will follow pt to ensure diet tolerance.    Swallow Evaluation Recommendations       SLP Diet Recommendations: Dysphagia 2 (Fine chop) solids;Thin liquid   Liquid Administration via: Cup;Straw   Medication Administration: Whole meds with puree   Supervision: Full assist for feeding   Compensations: Slow rate;Small sips/bites;Follow solids with liquid   Postural Changes: Seated upright at 90 degrees;Remain semi-upright after after feeds/meals (Comment)   Oral Care Recommendations: Oral care BID   Other Recommendations: Have oral suction available   Idonna Heeren I. Hardin Negus, [place], [place] Office number [phone] Pager [phone]  Horton Marshall 02/19/2020,2:15 PM

## 2020-02-19 NOTE — TOC Progression Note (Signed)
Transition of Care [facility]) - Progression Note    Patient Details  Name: [name] MRN: [id number] Date of Birth: 1951-07-11  Transition of Care [facility]) CM/SW Contact  Maryclare Labrador, RN Phone Number: 02/19/2020, 5:01 PM  Clinical Narrative:   [facility] for [facility]    Expected Discharge Plan: [place] Barriers to Discharge: Continued Medical Work up  Expected Discharge Plan and Services Expected Discharge Plan: [place] arrangements for the past 2 months: [place]                 DME Arranged: Hospital bed, 3-N-1(Hoyer lift) DME Agency: [employer]                   Social Determinants of Health (SDOH) Interventions    Readmission Risk Interventions No flowsheet data found.

## 2020-02-20 ENCOUNTER — Inpatient Hospital Stay (HOSPITAL_COMMUNITY): Payer: Medicare Other | Admitting: Anesthesiology

## 2020-02-20 ENCOUNTER — Encounter (HOSPITAL_COMMUNITY)
Admission: EM | Disposition: A | Discharge status: Hospice | Payer: Self-pay | Source: Home / Self Care | Attending: Internal Medicine

## 2020-02-20 ENCOUNTER — Inpatient Hospital Stay (HOSPITAL_COMMUNITY): Payer: Medicare Other

## 2020-02-20 ENCOUNTER — Encounter (HOSPITAL_COMMUNITY): Payer: Self-pay | Admitting: Internal Medicine

## 2020-02-20 HISTORY — PX: INSERTION OF DIALYSIS CATHETER: SHX1324

## 2020-02-20 LAB — GLUCOSE, CAPILLARY
Glucose-Capillary: 201 mg/dL — ABNORMAL HIGH (ref 70–99)
Glucose-Capillary: 191 mg/dL — ABNORMAL HIGH (ref 70–99)
Glucose-Capillary: 177 mg/dL — ABNORMAL HIGH (ref 70–99)
Glucose-Capillary: 215 mg/dL — ABNORMAL HIGH (ref 70–99)
Glucose-Capillary: 280 mg/dL — ABNORMAL HIGH (ref 70–99)

## 2020-02-20 LAB — BASIC METABOLIC PANEL
Anion gap: 13 (ref 5–15)
BUN: 33 mg/dL — ABNORMAL HIGH (ref 8–23)
CO2: 27 mmol/L (ref 22–32)
Calcium: 8.4 mg/dL — ABNORMAL LOW (ref 8.9–10.3)
Chloride: 99 mmol/L (ref 98–111)
Creatinine, Ser: 4.03 mg/dL — ABNORMAL HIGH (ref 0.44–1.00)
GFR calc Af Amer: 12 mL/min — ABNORMAL LOW (ref >60)
GFR calc non Af Amer: 11 mL/min — ABNORMAL LOW (ref >60)
Glucose, Bld: 215 mg/dL — ABNORMAL HIGH (ref 70–99)
Potassium: 3.6 mmol/L (ref 3.5–5.1)
Sodium: 139 mmol/L (ref 135–145)

## 2020-02-20 LAB — CBC
HCT: 25.9 % — ABNORMAL LOW (ref 36.0–46.0)
Hemoglobin: 7.7 g/dL — ABNORMAL LOW (ref 12.0–15.0)
MCH: 29.8 pg (ref 26.0–34.0)
MCHC: 29.7 g/dL — ABNORMAL LOW (ref 30.0–36.0)
MCV: 100.4 fL — ABNORMAL HIGH (ref 80.0–100.0)
Platelets: 81 10*3/uL — ABNORMAL LOW (ref 150–400)
RBC: 2.58 MIL/uL — ABNORMAL LOW (ref 3.87–5.11)
RDW: 16.3 % — ABNORMAL HIGH (ref 11.5–15.5)
WBC: 9.3 10*3/uL (ref 4.0–10.5)
nRBC: 0 % (ref 0.0–0.2)

## 2020-02-20 LAB — MAGNESIUM: Magnesium: 1.7 mg/dL (ref 1.7–2.4)

## 2020-02-20 SURGERY — INSERTION OF DIALYSIS CATHETER
Anesthesia: Monitor Anesthesia Care

## 2020-02-20 MED ORDER — PHENYLEPHRINE HCL (PRESSORS) 10 MG/ML IV SOLN
INTRAVENOUS | Status: DC | PRN
  Administered 2020-02-20: 80 ug via INTRAVENOUS

## 2020-02-20 MED ORDER — KETAMINE HCL 50 MG/5ML IJ SOSY
PREFILLED_SYRINGE | INTRAMUSCULAR | Status: AC
  Filled 2020-02-20: qty 5

## 2020-02-20 MED ORDER — LIDOCAINE-EPINEPHRINE 0.5 %-1:200000 IJ SOLN
INTRAMUSCULAR | Status: AC
  Filled 2020-02-20: qty 1

## 2020-02-20 MED ORDER — LIDOCAINE-EPINEPHRINE 0.5 %-1:200000 IJ SOLN
INTRAMUSCULAR | Status: DC | PRN
  Administered 2020-02-20: 50 mL

## 2020-02-20 MED ORDER — PROPOFOL 10 MG/ML IV BOLUS
INTRAVENOUS | Status: DC | PRN
  Administered 2020-02-20 (×3): 20 mg via INTRAVENOUS

## 2020-02-20 MED ORDER — DEXMEDETOMIDINE HCL IN NACL 200 MCG/50ML IV SOLN
INTRAVENOUS | Status: DC | PRN
  Administered 2020-02-20: .5 ug/kg/h via INTRAVENOUS

## 2020-02-20 MED ORDER — CHLORHEXIDINE GLUCONATE CLOTH 2 % EX PADS
6.0000 | MEDICATED_PAD | Freq: Every day | CUTANEOUS | Status: DC
  Administered 2020-02-21 – 2020-02-23 (×3): 6 via TOPICAL

## 2020-02-20 MED ORDER — PROPOFOL 10 MG/ML IV BOLUS
INTRAVENOUS | Status: AC
  Filled 2020-02-20: qty 20

## 2020-02-20 MED ORDER — ONDANSETRON HCL 4 MG/2ML IJ SOLN
INTRAMUSCULAR | Status: DC | PRN
  Administered 2020-02-20: 4 mg via INTRAVENOUS

## 2020-02-20 MED ORDER — SODIUM CHLORIDE 0.9 % IV SOLN
INTRAVENOUS | Status: AC
  Filled 2020-02-20: qty 1.2

## 2020-02-20 MED ORDER — FENTANYL CITRATE (PF) 250 MCG/5ML IJ SOLN
INTRAMUSCULAR | Status: AC
  Filled 2020-02-20: qty 5

## 2020-02-20 MED ORDER — LIDOCAINE 2% (20 MG/ML) 5 ML SYRINGE
INTRAMUSCULAR | Status: AC
  Filled 2020-02-20: qty 5

## 2020-02-20 MED ORDER — 0.9 % SODIUM CHLORIDE (POUR BTL) OPTIME
TOPICAL | Status: DC | PRN
  Administered 2020-02-20: 07:00:00 1000 mL

## 2020-02-20 MED ORDER — HEPARIN SODIUM (PORCINE) 1000 UNIT/ML IJ SOLN
INTRAMUSCULAR | Status: AC
  Filled 2020-02-20: qty 1

## 2020-02-20 MED ORDER — ONDANSETRON HCL 4 MG/2ML IJ SOLN: 4.0000 mg | Freq: Once | INTRAMUSCULAR | Status: DC | PRN

## 2020-02-20 MED ORDER — HEPARIN SODIUM (PORCINE) 1000 UNIT/ML IJ SOLN
INTRAMUSCULAR | Status: DC | PRN
  Administered 2020-02-20: 3400 [IU]

## 2020-02-20 MED ORDER — KETAMINE HCL 10 MG/ML IJ SOLN
INTRAMUSCULAR | Status: DC | PRN
  Administered 2020-02-20: 10 mg via INTRAVENOUS

## 2020-02-20 MED ORDER — FENTANYL CITRATE (PF) 100 MCG/2ML IJ SOLN: 25.0000 ug | INTRAMUSCULAR | Status: DC | PRN

## 2020-02-20 MED ORDER — SODIUM CHLORIDE 0.9 % IV SOLN: INTRAVENOUS | Status: DC | PRN

## 2020-02-20 MED ORDER — MIDAZOLAM HCL 2 MG/2ML IJ SOLN
INTRAMUSCULAR | Status: AC
  Filled 2020-02-20: qty 2

## 2020-02-20 MED ORDER — SODIUM CHLORIDE 0.9 % IV SOLN
INTRAVENOUS | Status: DC | PRN
  Administered 2020-02-20: 500 mL

## 2020-02-20 MED ORDER — CEFAZOLIN SODIUM-DEXTROSE 2-3 GM-%(50ML) IV SOLR
INTRAVENOUS | Status: DC | PRN
  Administered 2020-02-20: 2 g via INTRAVENOUS

## 2020-02-20 MED ORDER — SUCCINYLCHOLINE CHLORIDE 200 MG/10ML IV SOSY
PREFILLED_SYRINGE | INTRAVENOUS | Status: AC
  Filled 2020-02-20: qty 10

## 2020-02-20 SURGICAL SUPPLY — 40 items
ADH SKN CLS APL DERMABOND .7 (GAUZE/BANDAGES/DRESSINGS) ×1
BAG DECANTER FOR FLEXI CONT (MISCELLANEOUS) ×2 IMPLANT
BIOPATCH RED 1 DISK 7.0 (GAUZE/BANDAGES/DRESSINGS) ×2 IMPLANT
CATH PALINDROME RT-P 15FX19CM (CATHETERS) IMPLANT
CATH PALINDROME RT-P 15FX23CM (CATHETERS) ×1 IMPLANT
CATH PALINDROME RT-P 15FX28CM (CATHETERS) IMPLANT
CATH PALINDROME RT-P 15FX55CM (CATHETERS) IMPLANT
COVER PROBE W GEL 5X96 (DRAPES) ×2 IMPLANT
COVER SURGICAL LIGHT HANDLE (MISCELLANEOUS) ×2 IMPLANT
COVER WAND RF STERILE (DRAPES) ×2 IMPLANT
DECANTER SPIKE VIAL GLASS SM (MISCELLANEOUS) ×2 IMPLANT
DERMABOND ADVANCED (GAUZE/BANDAGES/DRESSINGS) ×1
DERMABOND ADVANCED .7 DNX12 (GAUZE/BANDAGES/DRESSINGS) ×1 IMPLANT
DRAPE C-ARM 42X72 X-RAY (DRAPES) ×2 IMPLANT
DRAPE CHEST BREAST 15X10 FENES (DRAPES) ×2 IMPLANT
GAUZE 4X4 16PLY RFD (DISPOSABLE) ×2 IMPLANT
GLOVE SS BIOGEL STRL SZ 7.5 (GLOVE) ×1 IMPLANT
GLOVE SUPERSENSE BIOGEL SZ 7.5 (GLOVE) ×1
GOWN STRL REUS W/ TWL LRG LVL3 (GOWN DISPOSABLE) ×2 IMPLANT
GOWN STRL REUS W/TWL LRG LVL3 (GOWN DISPOSABLE) ×4
KIT BASIN OR (CUSTOM PROCEDURE TRAY) ×2 IMPLANT
KIT TURNOVER KIT B (KITS) ×2 IMPLANT
NDL 18GX1X1/2 (RX/OR ONLY) (NEEDLE) ×1 IMPLANT
NDL HYPO 25GX1X1/2 BEV (NEEDLE) ×1 IMPLANT
NEEDLE 18GX1X1/2 (RX/OR ONLY) (NEEDLE) ×2 IMPLANT
NEEDLE 22X1 1/2 (OR ONLY) (NEEDLE) IMPLANT
NEEDLE HYPO 25GX1X1/2 BEV (NEEDLE) ×2 IMPLANT
NS IRRIG 1000ML POUR BTL (IV SOLUTION) ×2 IMPLANT
PACK SURGICAL SETUP 50X90 (CUSTOM PROCEDURE TRAY) ×2 IMPLANT
PAD ARMBOARD 7.5X6 YLW CONV (MISCELLANEOUS) ×4 IMPLANT
SOAP 2 % CHG 4 OZ (WOUND CARE) ×2 IMPLANT
SUT ETHILON 3 0 PS 1 (SUTURE) ×2 IMPLANT
SUT VICRYL 4-0 PS2 18IN ABS (SUTURE) ×2 IMPLANT
SYR 10ML LL (SYRINGE) ×2 IMPLANT
SYR 20ML LL LF (SYRINGE) ×2 IMPLANT
SYR 5ML LL (SYRINGE) ×4 IMPLANT
SYR CONTROL 10ML LL (SYRINGE) ×2 IMPLANT
TOWEL GREEN STERILE (TOWEL DISPOSABLE) ×4 IMPLANT
TOWEL GREEN STERILE FF (TOWEL DISPOSABLE) ×2 IMPLANT
WATER STERILE IRR 1000ML POUR (IV SOLUTION) ×2 IMPLANT

## 2020-02-20 NOTE — Progress Notes (Signed)
Family member Counsellor) called for update on patient condition. Answered questions as possible. Family member expresses appreciation and will call again in the morning.

## 2020-02-20 NOTE — Op Note (Signed)
    OPERATIVE REPORT  DATE OF SURGERY: 02/20/2020  PATIENT: [name], 69 y.o. female MRN: [id number]  DOB: 1951-06-20  PRE-OPERATIVE DIAGNOSIS: End-stage renal disease  POST-OPERATIVE DIAGNOSIS:  Same  PROCEDURE: Right IJ tunneled hemodialysis catheter  SURGEON:  Curt Jews, M.D.  PHYSICIAN ASSISTANT: Nurse  ANESTHESIA: Local with sedation  EBL: per anesthesia record  Total I/O In: 300 [I.V.:300] Out: -   BLOOD ADMINISTERED: none  DRAINS: none  SPECIMEN: none  COUNTS CORRECT:  YES  PATIENT DISPOSITION:  PACU - hemodynamically stable  PROCEDURE DETAILS: Patient was taken the operating placed supine position where the area of the right and left neck and chest prepped draped in usual sterile fashion.  The patient had an indwelling right temporary catheter.  This was prepped into the field.  A guidewire was passed through the temporary catheter and the temporary catheter was removed.  A dilator and peel-away sheath was passed over the guidewire and the guidewire and dilator were removed.  A 23 cm catheter was passed through the peel-away sheath which was then removed.  The catheter tips were positioned at the level of the distal right atrium.  The catheter was brought through a subcutaneous tunnel through a separate stab incision.  The 2 lm ports were attached and both lumens flushed and aspirated easily and were locked with 1000 unit/cc heparin.  The catheter was secured to the skin with a 3-0 nylon stitch and the entry site was closed with a 4-0 subcuticular Vicryl stitch.  Sterile dressing was applied and the patient was transferred to the recovery room in stable condition   [name], M.D., [facility] 02/20/2020 8:27 AM

## 2020-02-20 NOTE — Anesthesia Postprocedure Evaluation (Signed)
Anesthesia Post Note  Patient: [name]  Procedure(s) Performed: INSERTION OF TUNNELLED DIALYSIS CATHETER (N/A )     Patient location during evaluation: PACU Anesthesia Type: MAC Level of consciousness: awake and alert Pain management: pain level controlled Vital Signs Assessment: post-procedure vital signs reviewed and stable Respiratory status: spontaneous breathing, nonlabored ventilation, respiratory function stable and patient connected to nasal cannula oxygen Cardiovascular status: stable and blood pressure returned to baseline Postop Assessment: no apparent nausea or vomiting Anesthetic complications: no    Last Vitals:  Vitals:   02/20/20 2032 02/20/20 2100  BP: (!) 128/48   Pulse: 83   Resp: 16 18  Temp: 37.3 C   SpO2: 95%     Last Pain:  Vitals:   02/20/20 2032  TempSrc: Oral  PainSc:                  Kupono Marling COKER

## 2020-02-20 NOTE — Transfer of Care (Signed)
Immediate Anesthesia Transfer of Care Note  Patient: [name]  Procedure(s) Performed: INSERTION OF TUNNELLED DIALYSIS CATHETER (N/A )  Patient Location: PACU  Anesthesia Type:MAC  Level of Consciousness: drowsy  Airway & Oxygen Therapy: Patient Spontanous Breathing and Patient connected to nasal cannula oxygen  Post-op Assessment: Report given to RN and Post -op Vital signs reviewed and unstable, Anesthesiologist notified  Post vital signs: Reviewed and stable  Last Vitals:  Vitals Value Taken Time  BP 87/37 02/20/20 0820  Temp 36.6 C 02/20/20 0820  Pulse 65 02/20/20 0820  Resp 21 02/20/20 0820  SpO2      Last Pain:  Vitals:   02/20/20 0351  TempSrc: Oral  PainSc:          Complications: No apparent anesthesia complications

## 2020-02-20 NOTE — Interval H&P Note (Signed)
History and Physical Interval Note:  02/20/2020 7:22 AM  [name]  has presented today for surgery, with the diagnosis of ESRD.  The various methods of treatment have been discussed with the patient and family. After consideration of risks, benefits and other options for treatment, the patient has consented to  Procedure(s): INSERTION OF TUNNELLED DIALYSIS CATHETER (N/A) as a surgical intervention.  The patient's history has been reviewed, patient examined, no change in status, stable for surgery.  I have reviewed the patient's chart and labs.  Questions were answered to the patient's satisfaction.     Curt Jews

## 2020-02-20 NOTE — Progress Notes (Signed)
[facility] ROUNDING NOTE   Subjective:   This is 69 year old lady with history of diabetes coronary artery disease CVA thrombocytopenia diastolic heart failure CKD stage V, now end-stage renal disease on dialysis.  History of left upper arm extremity AV fistula placed 07/2019, PAD status post left BKA fluid overload and right lower extremity cellulitis admitted 02/09/2020.  Thought to have uremic encephalopathy contributing to lethargy.  Was initiated on hemodialysis.   Dialysis #4. 02/18/2020 removal 1.8 L status post transfusion 1 unit packed red blood cells. Appreciate assistance from palliative medicine. Next dialysis will be 02/20/2020.  Dialysis 4#.  Appreciate assistance from Dr.Early with placement of tunneled dialysis catheter 02/19/2020  Blood pressure 135/54 pulse 64 temperature 97.9 O2 sats 100% 3 L nasal cannula  Sodium 138 potassium 3.4 chloride 100 CO2 28 BUN 16 creatinine 2.46 glucose 151 calcium 8.4 magnesium 1.6 albumin 2.0 AST 11 ALT 14. Hemoglobin 7.7 platelet 81  Allopurinol 50 mg daily atorvastatin 80 mg daily Wellbutrin 100 mg 3 times daily Coreg 25 mg every 12 hours Plavix 75 mg daily darbepoetin 60 mcg every Thursday doxycycline 100 mg every 12 hours, insulin sliding scale, Remeron 7.5 mg nightly  Objective:  Vital signs in last 24 hours:  Temp:  [97.3 F (36.3 C)-99.5 F (37.5 C)] 97.9 F (36.6 C) (04/03 0351) Pulse Rate:  [63-72] 65 (04/03 0351) Resp:  [16-19] 16 (04/03 0351) BP: (122-151)/(47-61) 135/54 (04/03 0351) SpO2:  [100 %] 100 % (04/03 0351) Weight:  [115.2 kg] 115.2 kg (04/03 0148)  Weight change: -4.99 kg Filed Weights   02/18/20 1830 02/19/20 0400 02/20/20 0148  Weight: 118.4 kg 109.8 kg 115.2 kg    Intake/Output: I/O last 3 completed shifts: In: 480 [P.O.:480] Out: -    Intake/Output this shift:  No intake/output data recorded.  Gen: Awake and comfortable nondistressed nonverbal CVS: Pulse regular rhythm, normal rate, S1 and S2  normal.  Right IJ hemodialysis catheter Resp: Poor inspiratory effort with decreased breath sounds over bases, no rales Abd: Soft, flat, nontender Ext: Right leg in Ace wrap, trace edema left ankle.  Short pulsatile right upper arm AVF   Basic Metabolic Panel: Recent Labs  Lab 02/14/20 0722 02/14/20 0722 02/15/20 0554 02/15/20 1357 02/15/20 1357 02/16/20 0506 02/17/20 0443 02/18/20 0511 02/19/20 0410 02/20/20 0500  NA 143  --   --  141  --   --  135 142 138  --   K 3.6  --   --  3.8  --   --  3.6 4.0 3.4*  --   CL 108  --   --  103  --   --  100 103 100  --   CO2 22  --   --  23  --   --  26 25 28   --   GLUCOSE 109*  --   --  143*  --   --  170* 156* 151*  --   BUN 57*  --   --  61*  --   --  26* 39* 16  --   CREATININE 3.40*  --   --  3.76*  --   --  2.68* 4.19* 2.46*  --   CALCIUM 8.8*   < >  --  8.5*   < >  --  8.2* 8.7* 8.4*  --   MG 1.7   < > 1.9  --   --  1.7  --  1.8 1.6* 1.7  PHOS 2.5  --   --  2.7  --   --   --   --   --   --    < > = values in this interval not displayed.    Liver Function Tests: Recent Labs  Lab 02/14/20 0722 02/15/20 1357 02/17/20 0443 02/18/20 0511 02/19/20 0410  AST  --   --  14* 12* 11*  ALT  --   --  19 18 14   ALKPHOS  --   --  109 95 99  BILITOT  --   --  1.6* 1.2 1.3*  PROT  --   --  5.4* 5.4* 5.7*  ALBUMIN 2.3* 2.2* 2.0* 1.9* 2.0*   No results for input(s): LIPASE, AMYLASE in the last 168 hours. Recent Labs  Lab 02/17/20 0443  AMMONIA 19    CBC: Recent Labs  Lab 02/16/20 1230 02/16/20 1230 02/17/20 0443 02/17/20 1410 02/18/20 0511 02/18/20 2352 02/19/20 0410 02/20/20 0500  WBC 11.3*  --  11.5*  --  11.2*  --  10.5 9.3  HGB 7.5*   < > 7.3*  --  7.3* 8.0* 8.0* 7.7*  HCT 25.1*   < > 24.4* 23.6* 24.8* 25.8* 26.3* 25.9*  MCV 103.3*  --  102.5*  --  104.2*  --  100.4* 100.4*  PLT 93*  --  84*  --  90*  --  79* 81*   < > = values in this interval not displayed.    Cardiac Enzymes: No results for input(s): CKTOTAL,  CKMB, CKMBINDEX, TROPONINI in the last 168 hours.  BNP: Invalid input(s): POCBNP  CBG: Recent Labs  Lab 02/19/20 0623 02/19/20 1156 02/19/20 1809 02/19/20 2122 02/20/20 0608  GLUCAP 142* 188* 170* 245* 201*    Microbiology: Results for orders placed or performed during the hospital encounter of 02/09/20  Blood culture (routine x 2)     Status: None   Collection Time: 02/09/20  1:25 AM   Specimen: BLOOD LEFT ARM  Result Value Ref Range Status   Specimen Description BLOOD LEFT ARM  Final   Special Requests   Final    BOTTLES DRAWN AEROBIC AND ANAEROBIC Blood Culture adequate volume   Culture   Final    NO GROWTH 5 DAYS Performed at [facility], [place] [address], [place], [place] [address]    Report Status 02/14/2020 FINAL  Final  Respiratory Panel by RT PCR (Flu A&B, Covid) - Nasopharyngeal Swab     Status: None   Collection Time: 02/09/20  3:08 AM   Specimen: Nasopharyngeal Swab  Result Value Ref Range Status   SARS Coronavirus 2 by RT PCR NEGATIVE NEGATIVE Final    Comment: (NOTE) SARS-CoV-2 target nucleic acids are NOT DETECTED. The SARS-CoV-2 RNA is generally detectable in upper respiratoy specimens during the acute phase of infection. The lowest concentration of SARS-CoV-2 viral copies this assay can detect is 131 copies/mL. A negative result does not preclude SARS-Cov-2 infection and should not be used as the sole basis for treatment or other patient management decisions. A negative result may occur with  improper specimen collection/handling, submission of specimen other than nasopharyngeal swab, presence of viral mutation(s) within the areas targeted by this assay, and inadequate number of viral copies (<131 copies/mL). A negative result must be combined with clinical observations, patient history, and epidemiological information. The expected result is Negative. Fact Sheet for Patients:  PinkCheek.be Fact Sheet for Healthcare  Providers:  GravelBags.it This test is not yet ap proved or cleared by the [place] FDA  and  has been authorized for detection and/or diagnosis of SARS-CoV-2 by FDA under an Emergency Use Authorization (EUA). This EUA will remain  in effect (meaning this test can be used) for the duration of the COVID-19 declaration under Section 564(b)(1) of the Act, 21 U.S.C. section 360bbb-3(b)(1), unless the authorization is terminated or revoked sooner.    Influenza A by PCR NEGATIVE NEGATIVE Final   Influenza B by PCR NEGATIVE NEGATIVE Final    Comment: (NOTE) The Xpert Xpress SARS-CoV-2/FLU/RSV assay is intended as an aid in  the diagnosis of influenza from Nasopharyngeal swab specimens and  should not be used as a sole basis for treatment. Nasal washings and  aspirates are unacceptable for Xpert Xpress SARS-CoV-2/FLU/RSV  testing. Fact Sheet for Patients: PinkCheek.be Fact Sheet for Healthcare Providers: GravelBags.it This test is not yet approved or cleared by the [place] FDA and  has been authorized for detection and/or diagnosis of SARS-CoV-2 by  FDA under an Emergency Use Authorization (EUA). This EUA will remain  in effect (meaning this test can be used) for the duration of the  Covid-19 declaration under Section 564(b)(1) of the Act, 21  U.S.C. section 360bbb-3(b)(1), unless the authorization is  terminated or revoked. Performed at [facility], [place] [address] [place], [place] [address]   Blood culture (routine x 2)     Status: None   Collection Time: 02/09/20  3:15 AM   Specimen: BLOOD LEFT HAND  Result Value Ref Range Status   Specimen Description BLOOD LEFT HAND  Final   Special Requests   Final    BOTTLES DRAWN AEROBIC ONLY Blood Culture results may not be optimal due to an inadequate volume of blood received in culture bottles   Culture   Final    NO GROWTH 5  DAYS Performed at [facility], [place] [address], [place], [place] [address]    Report Status 02/14/2020 FINAL  Final    Coagulation Studies: No results for input(s): LABPROT, INR in the last 72 hours.  Urinalysis: No results for input(s): COLORURINE, LABSPEC, PHURINE, GLUCOSEU, HGBUR, BILIRUBINUR, KETONESUR, PROTEINUR, UROBILINOGEN, NITRITE, LEUKOCYTESUR in the last 72 hours.  Invalid input(s): APPERANCEUR    Imaging: MR BRAIN WO CONTRAST  Result Date: 02/18/2020 CLINICAL DATA:  Acute metabolic encephalopathy EXAM: MRI HEAD WITHOUT CONTRAST TECHNIQUE: Multiplanar, multiecho pulse sequences of the brain and surrounding structures were obtained without intravenous contrast. COMPARISON:  MRI 12/19/2016 FINDINGS: Brain: There is a small subcentimeter focus of mildly reduced diffusion in the left corona radiata adjacent to a chronic small vessel infarct. Confluent areas of T2 hyperintensity in the supratentorial white matter are nonspecific but may reflect advanced chronic microvascular ischemic changes. Chronic infarcts are identified including involvement of the right occipital lobe and right cerebellum. Scattered small foci of susceptibility hypointensity again identified within the cerebral white matter, left lentiform nucleus, pons, and cerebellum likely reflecting chronic microhemorrhages. Increase compared to the prior study may be on a technical basis. Prominent T2 hypointensity of the basal ganglia likely reflects mineralization or other deposition. Prominence of the ventricles and sulci reflects moderate generalized parenchymal volume loss. Vascular: Major vessel flow voids at the skull base are preserved. Skull and upper cervical spine: Marrow signal is mildly heterogeneous. No aggressive osseous lesion. Sinuses/Orbits: Paranasal sinuses are aerated. Right lens replacement. Other: Incidental note is made of a partially empty sella facet uremic suture. Mastoid air cells are clear.  IMPRESSION: Small acute to subacute infarction of the left corona radiata. Chronic/nonemergent  findings detailed above including infarcts, chronic microvascular ischemic changes, and microhemorrhages. Electronically Signed   By: Macy Mis M.D.   On: 02/18/2020 09:50   DG Swallowing Func-Speech Pathology  Result Date: 02/19/2020 Objective Swallowing Evaluation: Type of Study: MBS-Modified Barium Swallow Study  Patient Details Name: [name] MRN: [id number] Date of Birth: [birth year] Today's Date: 02/19/2020 Time: SLP Start Time (ACUTE ONLY): 6659 -SLP Stop Time (ACUTE ONLY): 1255 SLP Time Calculation (min) (ACUTE ONLY): 10 min Past Medical History: Past Medical History: Diagnosis Date . Anemia  . Arthritis   "right shoulder" (12/19/2016) . CAD S/P percutaneous coronary angioplasty 5/110   status post PCI to the RCA for inferior STEMI . Cervical cancer ([place])  . Chronic diastolic heart failure, NYHA class 2 ([place])   Echocardiogram 06/2014:  Technically difficult study. Normal LV size with mild LVH. EF 55-60%. Moderate diastolic dysfunction, normal RV size and function. Likely aortic sclerosis without stenosis . Chronic lower back pain  . CKD (chronic kidney disease), stage IV ([place])   Recent acute on chronic exacerbation in July 2014 . Depression  . Diabetes mellitus, type II, insulin dependent ([place])   With complications - CAD, CVA, peripheral ulcer , . Diabetic foot ulcer ([place])  . Diabetic peripheral neuropathy associated with type 2 diabetes mellitus ([place])  . Dry gangrene ([facility])   Left second toe; now on Sole of L foot --  s/p L BKA . History of blood transfusion   "w/hysterectomy"  . Hyperlipidemia LDL goal <70  . Hypertension associated with diabetes ([place])  . Obesity, Class III, BMI 40-49.9 (morbid obesity) ([facility])   BMI 43; 5' 2',  235 pounds 6.4 ounces . On home oxygen therapy   "2.5L prn; sometimes as little as once/month" (12/19/2016) . PAD (peripheral artery disease) ([place])    -- Most recent Dopplers April 2016  Status post left BKA. Known right PTA occlusion . Pneumonia   "childhood" . ST elevation myocardial infarction (STEMI) of inferior wall ([place])  03/2009  Ostial/proximal RCA occlusion treated with BMS stent . Stroke/cerebrovascular accident ([place]) 03/18/2013; 12/19/2016  a. Right-sided weakness, mostly with balance issues and only mild weakness;; b. Slurring speach -- L Posterior Putamen CVA Past Surgical History: Past Surgical History: Procedure Laterality Date . ABDOMINAL HYSTERECTOMY   . AMPUTATION Left 08/07/2013  Procedure: left midfoot amputation;  Surgeon: Marianna Payment, MD;  Location: [facility];  Service: Orthopedics;  Laterality: Left; . AMPUTATION Left 08/09/2013  Procedure: AMPUTATION BELOW KNEE;  Surgeon: Marianna Payment, MD;  Location: [facility];  Service: Orthopedics;  Laterality: Left; . AV FISTULA PLACEMENT Left 12/03/2014  Procedure: ARTERIOVENOUS (AV) FISTULA CREATION;  Surgeon: Angelia Mould, MD;  Location: [facility];  Service: Vascular;  Laterality: Left; . AV FISTULA PLACEMENT Right 08/12/2019  Procedure: ARTERIOVENOUS (AV) FISTULA CREATION;  Surgeon: Marty Heck, MD;  Location: [facility];  Service: Vascular;  Laterality: Right; . CARDIAC CATHETERIZATION  03/31/2009  Proximal RCA thrombotic occlusion (inferior STEMI) other coronaries but in a codominant system . EYE SURGERY    "bleeding; vessels leaking" . [place]  "broke her leg in 2 places when she was young" . IR FLUORO GUIDE CV LINE RIGHT  02/12/2020 . IR US GUIDE VASC ACCESS RIGHT  02/12/2020 . LEG SURGERY    hole in bone( during Childhood) . LEG TENDON SURGERY Right 2000s  patient fell @ [place] . PERCUTANEOUS CORONARY STENT INTERVENTION (PCI-S)  03/31/2009  PTCA , bare-metal stent 3.5 mm x 24 mm ostium of RCA ,  EF 55% . TRANSTHORACIC ECHOCARDIOGRAM  06/2014; 12/2016  a. Technically difficult study. Normal LV size with mild LVH. EF 55-60%. Mod - GR 2 DD. Nl RV size & fxn. ~ Aortic Sclerosis w/ stenosis;; b. LVEF 65-70%, GR 1-2 DD.  No RWMA, Mod LA Dilation HPI: Pt is a 69 y/o female admitted 02/09/20 secondary to RLE cellulitis and volume overload. PMH includes CAD s/p stent, DM, CKD, CVA, dCHF, L BKA. Pt had a previous swallow evaluation in 2014 with functional appearing swallow until challenged with large, consecutive boluses. Regular solids and thin liquids via small, single sips recommended at that time. MRI 4/1: Small acute to subacute infarction of the left corona radiata.  Subjective: pt drowsy, initially declines all POs offered but does take a small amount when given encouragement Assessment / Plan / Recommendation CHL IP CLINICAL IMPRESSIONS 02/19/2020 Clinical Impression Pt presents with mild oropharyngeal dysphagia characterized by impaired A-P transport, reduced lingual retraction, impaired bolus cohesion, and a pharyngeal delay. She demonstrated premature spillage to the level of the valleculae, vallecular residue, and occasional transient penetration (PAS 2) with thin liquids. A dyspahgia 2 diet with thin liquids is recommended at this time and SLP will follow pt to ensure diet tolerance.  SLP Visit Diagnosis Dysphagia, oropharyngeal phase (R13.12) Attention and concentration deficit following -- Frontal lobe and executive function deficit following -- Impact on safety and function Moderate aspiration risk;Risk for inadequate nutrition/hydration   CHL IP TREATMENT RECOMMENDATION 02/19/2020 Treatment Recommendations Therapy as outlined in treatment plan below   Prognosis 02/19/2020 Prognosis for Safe Diet Advancement Fair Barriers to Reach Goals Cognitive deficits Barriers/Prognosis Comment -- CHL IP DIET RECOMMENDATION 02/19/2020 SLP Diet Recommendations Dysphagia 2 (Fine chop) solids;Thin liquid Liquid Administration via Cup;Straw Medication Administration Whole meds with puree Compensations Slow rate;Small sips/bites;Follow solids with liquid Postural Changes Seated upright at 90 degrees;Remain semi-upright after after feeds/meals  (Comment)   CHL IP OTHER RECOMMENDATIONS 02/19/2020 Recommended Consults -- Oral Care Recommendations Oral care BID Other Recommendations Have oral suction available   CHL IP FOLLOW UP RECOMMENDATIONS 02/19/2020 Follow up Recommendations Skilled Nursing facility   [facility] IP FREQUENCY AND DURATION 02/19/2020 Speech Therapy Frequency (ACUTE ONLY) min 2x/week Treatment Duration 2 weeks      CHL IP ORAL PHASE 02/19/2020 Oral Phase Impaired Oral - Pudding Teaspoon -- Oral - Pudding Cup -- Oral - Honey Teaspoon -- Oral - Honey Cup -- Oral - Nectar Teaspoon -- Oral - Nectar Cup -- Oral - Nectar Straw -- Oral - Thin Teaspoon -- Oral - Thin Cup -- Oral - Thin Straw Premature spillage Oral - Puree Reduced posterior propulsion Oral - Mech Soft Impaired mastication Oral - Regular -- Oral - Multi-Consistency -- Oral - Pill Reduced posterior propulsion Oral Phase - Comment --  CHL IP PHARYNGEAL PHASE 02/19/2020 Pharyngeal Phase Impaired Pharyngeal- Pudding Teaspoon -- Pharyngeal -- Pharyngeal- Pudding Cup -- Pharyngeal -- Pharyngeal- Honey Teaspoon -- Pharyngeal -- Pharyngeal- Honey Cup -- Pharyngeal -- Pharyngeal- Nectar Teaspoon -- Pharyngeal -- Pharyngeal- Nectar Cup -- Pharyngeal -- Pharyngeal- Nectar Straw -- Pharyngeal -- Pharyngeal- Thin Teaspoon -- Pharyngeal -- Pharyngeal- Thin Cup -- Pharyngeal -- Pharyngeal- Thin Straw Penetration/Aspiration during swallow;Reduced tongue base retraction Pharyngeal Material enters airway, remains ABOVE vocal cords then ejected out Pharyngeal- Puree Reduced tongue base retraction;Pharyngeal residue - valleculae Pharyngeal -- Pharyngeal- Mechanical Soft Reduced tongue base retraction;Pharyngeal residue - valleculae Pharyngeal -- Pharyngeal- Regular -- Pharyngeal -- Pharyngeal- Multi-consistency -- Pharyngeal -- Pharyngeal- Pill -- Pharyngeal -- Pharyngeal Comment --  CHL IP  CERVICAL ESOPHAGEAL PHASE 02/19/2020 Cervical Esophageal Phase WFL Pudding Teaspoon -- Pudding Cup -- Honey Teaspoon -- Honey Cup  -- Nectar Teaspoon -- Nectar Cup -- Nectar Straw -- Thin Teaspoon -- Thin Cup -- Thin Straw -- Puree -- Mechanical Soft -- Regular -- Multi-consistency -- Pill -- Cervical Esophageal Comment -- Shanika I. Hardin Negus, [place], [place] Office number [phone] Pager [place] 02/19/2020, 2:17 PM                Medications:   . [MAR Hold] sodium chloride Stopped (02/10/20 1409)  . [MAR Hold] ferumoxytol     . [MAR Hold] sodium chloride   Intravenous Once  . [MAR Hold] allopurinol  50 mg Oral Daily  . [MAR Hold] antiseptic oral rinse  15 mL Mouth Rinse BID  . [MAR Hold] atorvastatin  80 mg Oral q1800  . [MAR Hold] buPROPion  100 mg Oral TID  . [MAR Hold] carvedilol  25 mg Oral Q12H  . [MAR Hold] Chlorhexidine Gluconate Cloth  6 each Topical Q0600  . [MAR Hold] Chlorhexidine Gluconate Cloth  6 each Topical Q0600  . [MAR Hold] clopidogrel  75 mg Oral Daily  . [MAR Hold] darbepoetin (ARANESP) injection - NON-DIALYSIS  60 mcg Subcutaneous Q Thu-1800  . [MAR Hold] feeding supplement (NEPRO CARB STEADY)  237 mL Oral BID BM  . [MAR Hold] folic acid  1 mg Oral Daily  . [MAR Hold] insulin aspart  0-6 Units Subcutaneous TID WC  . [MAR Hold] lactulose  20 g Oral BID  . [MAR Hold] mirtazapine  7.5 mg Oral QHS  . [MAR Hold] multivitamin  1 tablet Oral QHS  . [MAR Hold] omega-3 acid ethyl esters  1 g Oral BID  . [MAR Hold] sodium chloride flush  3 mL Intravenous Q12H   [MAR Hold] sodium chloride, 0.9 % irrigation (POUR BTL), [MAR Hold] acetaminophen **OR** [MAR Hold] acetaminophen, heparin irrigation 6000 unit, heparin, lidocaine-EPINEPHrine, [MAR Hold] ondansetron **OR** [MAR Hold] ondansetron (ZOFRAN) IV, [MAR Hold] Resource ThickenUp Clear, [MAR Hold] sodium chloride flush  Assessment/ Plan:  1.  End-stage renal disease: Remains lethargic despite sequential dialysis.  She is now end-stage renal disease.  Started on dialysis via temporary IJ dialysis  catheter placed by IR after RUA AVF deemed unusable;  .  She will need conversion to [facility] and placement of permanent access at some point.  Patient continues to be lethargic may be slightly improved after dialysis she was hungry and appropriate.  We will plan next dialysis treatment 02/20/2020. Recommend palliative medicine involvement.  Underwent TDC placement by vascular surgery appreciate assistance from Dr. Donnetta Hutching 02/19/2020. 2.  Right lower extremity cellulitis: Clinically improving on oral doxycycline.  With [employer]. 3.  History of congestive heart failure with diastolic dysfunction: Currently appears to be compensated with dialysis started yesterday.  Leave off of diuretics and continue to monitor on dialysis. 4.  Anemia: Downtrending hemoglobin and hematocrit, with iron deficiency and on ESA. 5.  Secondary hyperparathyroidism: Calcium and phosphorus levels currently at goal, poor oral intake and not on binders   LOS: [place] @TODAY @8 :06 AM

## 2020-02-20 NOTE — Progress Notes (Signed)
Patient has been transported to procedure.

## 2020-02-20 NOTE — Anesthesia Preprocedure Evaluation (Signed)
Anesthesia Evaluation  Patient identified by MRN, date of birth, ID band Patient confused    Reviewed: Allergy & Precautions, Patient's Chart, lab work & pertinent test results, Unable to perform ROS - Chart review only  Airway Mallampati: II     Mouth opening: Limited Mouth Opening  Dental  (+) Edentulous Upper, Edentulous Lower   Pulmonary former smoker,    breath sounds clear to auscultation       Cardiovascular hypertension,  Rhythm:Regular Rate:Normal     Neuro/Psych    GI/Hepatic   Endo/Other  diabetes  Renal/GU      Musculoskeletal   Abdominal   Peds  Hematology   Anesthesia Other Findings   Reproductive/Obstetrics                             Anesthesia Physical Anesthesia Plan  ASA: III  Anesthesia Plan: MAC   Post-op Pain Management:    Induction: Intravenous  PONV Risk Score and Plan: Ondansetron  Airway Management Planned: Natural Airway and Simple Face Mask  Additional Equipment:   Intra-op Plan:   Post-operative Plan:   Informed Consent: I have reviewed the patients History and Physical, chart, labs and discussed the procedure including the risks, benefits and alternatives for the proposed anesthesia with the patient or authorized representative who has indicated his/her understanding and acceptance.       Plan Discussed with: CRNA and Anesthesiologist  Anesthesia Plan Comments:         Anesthesia Quick Evaluation

## 2020-02-20 NOTE — Progress Notes (Signed)
PROGRESS NOTE    [name]  VHQ:[id number] DOB: February 16, 1951 DOA: 02/09/2020 PCP: Katherina Mires, MD   Brief Narrative:  69 year old female with history of DM-2, CAD s/p BMS to RCA, CVA, thrombocytopenia, diastolic CHF, CKD-5 with LUE aVF placed in 07/2019, PAD s/p left BKA presenting with progressive redness and edema of right lower extremity, and admitted for right lower extremity cellulitis, fluid overload and acute on chronic diastolic CHF.  Patient was started on IV vancomycin for cellulitis.    Developed AKI therefore nephrology team consulted initially started on Lasix and gentle hydration.  Renal function failed to improve therefore started on hemodialysis on 3/27.  Right IJ tunnel catheter placed 02/20/2020.   Assessment & Plan:   Principal Problem:   Acute on chronic diastolic CHF (congestive heart failure) (HCC) Active Problems:   Acute renal failure superimposed on stage 4 chronic kidney disease (HCC)   Type 2 diabetes mellitus with renal manifestations (HCC)   CAD S/P PCI TO RCA for Inferior STEMI. BMS   CKD (chronic kidney disease), stage IV (HCC)   Anemia in chronic kidney disease   CVA (cerebral vascular accident) ([place])   AKI (acute kidney injury) ([place])   Cellulitis of right leg   Thrombocytopenia (HCC)   Hyperbilirubinemia   Xerostomia   Advanced care planning/counseling discussion   Full code status   Dry skin   Physical debility   Palliative care by specialist   Goals of care, counseling/discussion   Acute CVA (cerebrovascular accident) ([place])  Acute kidney injury on CKD stage V Anemia of chronic disease -Getting dialysis via temporary dialysis catheter.  Nephrology social worker spoke with the family regarding outpatient arrangements, until she gets her strength back it will be difficult to get this done. -Right IJ tunneled catheter placed 02/20/2020 -Hemodialysis per nephrology team -BUN and creatinine improving. -Status post 1 unit PRBC 4/1.  Acute on  chronic diastolic congestive heart failure, ejection fraction 65% -Volume status is stable for now.  Stable with dialysis  Right lower extremity cellulitis, resolved -Improved, completed 7 days of doxycycline, ended 4/13  Acute metabolic encephalopathy Acute/subacute CVA, corona radiata -Combination of uremia/hyperammonia.  No focal neuro deficits.  CT of the head is negative.  TSH, B12 are normal -Lactulose twice daily -Continue supportive care, avoid sedative medication. -Continue Plavix and statin -Spoke with Dr Cheral Marker on 4/1 who reviewed patient's MRI.  No further input at this time.  Continue current management. -Hemoglobin A1c 6.9, LDL 12  Diabetes mellitus type 2 controlled -Hemoglobin A1c 6.0.  Continue current regimen.  Normocytic anemia Mild folate deficiency -Continue folic acid, insulin sliding scale and Accu-Cheks  History of peripheral arterial disease status post left-sided BKA -Continue Plavix, Lipitor and Zetia  Coronary artery disease status post bare-metal stent to RCA in 2010 -Continue home cardiac meds  Lack of appetite/poor oral intake Mild protein calorie malnutrition -Continue mirtazapine.  Encourage p.o. intake.  Dietitian/speech and swallow working with her.MBS planned today  Goals of care discussion-palliative care team following    DVT prophylaxis: None secondary to thrombocytopenia Code Status: Full code Family Communication: None today Disposition Plan:   Patient From= home  Patient Anticipated D/C place= once cleared by nephrology, her main issue is being strong enough to get seat for outpatient hemodialysis.  Complicated disposition, family aware.  Subjective: No complaints this morning.  She is resting but easily arousable.  Review of Systems Otherwise negative except as per HPI, including: General = no fevers, chills, dizziness,  fatigue HEENT/EYES =  negative for loss of vision, double vision, blurred vision,  sore  throa Cardiovascular= negative for chest pain, palpitation Respiratory/lungs= negative for shortness of breath, cough, wheezing; hemoptysis,  Gastrointestinal= negative for nausea, vomiting, abdominal pain Genitourinary= negative for Dysuria MSK = Negative for arthralgia, myalgias Neurology= Negative for headache, numbness, tingling  Psychiatry= Negative for suicidal and homocidal ideation Skin= Negative for Rash   Examination:  Constitutional: Generally weak and chronically ill-appearing.  Not in acute distress at this time. Respiratory: Clear to auscultation bilaterally Cardiovascular: Normal sinus rhythm, no rubs Abdomen: Nontender nondistended good bowel sounds Musculoskeletal: No edema noted Skin: No rashes seen Neurologic: CN 2-12 grossly intact.  And nonfocal Psychiatric: Normal judgment and insight. Alert and oriented x 3. Normal mood.    Right IJ tunnel catheter noted  Objective: Vitals:   02/20/20 0351 02/20/20 0820 02/20/20 0835 02/20/20 0909  BP: (!) 135/54 (!) 87/37 (!) 98/40   Pulse: 65 65 60   Resp: 16 (!) 21 17 15   Temp: 97.9 F (36.6 C) 97.9 F (36.6 C)    TempSrc: Oral     SpO2: 100% 94% 100%   Weight:      Height:        Intake/Output Summary (Last 24 hours) at 02/20/2020 1110 Last data filed at 02/20/2020 0909 Gross per 24 hour  Intake 780 ml  Output 0 ml  Net 780 ml   Filed Weights   02/18/20 1830 02/19/20 0400 02/20/20 0148  Weight: 118.4 kg 109.8 kg 115.2 kg     Data Reviewed:   CBC: Recent Labs  Lab 02/16/20 1230 02/16/20 1230 02/17/20 0443 02/17/20 1410 02/18/20 0511 02/18/20 2352 02/19/20 0410 02/20/20 0500  WBC 11.3*  --  11.5*  --  11.2*  --  10.5 9.3  HGB 7.5*   < > 7.3*  --  7.3* 8.0* 8.0* 7.7*  HCT 25.1*   < > 24.4* 23.6* 24.8* 25.8* 26.3* 25.9*  MCV 103.3*  --  102.5*  --  104.2*  --  100.4* 100.4*  PLT 93*  --  84*  --  90*  --  79* 81*   < > = values in this interval not displayed.   Basic Metabolic Panel: Recent  Labs  Lab 02/14/20 0722 02/14/20 0722 02/15/20 0554 02/15/20 1357 02/16/20 0506 02/17/20 0443 02/18/20 0511 02/19/20 0410 02/20/20 0500  NA 143   < >  --  141  --  135 142 138 139  K 3.6   < >  --  3.8  --  3.6 4.0 3.4* 3.6  CL 108   < >  --  103  --  100 103 100 99  CO2 22   < >  --  23  --  26 25 28 27   GLUCOSE 109*   < >  --  143*  --  170* 156* 151* 215*  BUN 57*   < >  --  61*  --  26* 39* 16 33*  CREATININE 3.40*   < >  --  3.76*  --  2.68* 4.19* 2.46* 4.03*  CALCIUM 8.8*   < >  --  8.5*  --  8.2* 8.7* 8.4* 8.4*  MG 1.7   < > 1.9  --  1.7  --  1.8 1.6* 1.7  PHOS 2.5  --   --  2.7  --   --   --   --   --    < > = values in this interval not  displayed.   GFR: Estimated Creatinine Clearance: 16.1 mL/min (A) (by C-G formula based on SCr of 4.03 mg/dL (H)). Liver Function Tests: Recent Labs  Lab 02/14/20 0722 02/15/20 1357 02/17/20 0443 02/18/20 0511 02/19/20 0410  AST  --   --  14* 12* 11*  ALT  --   --  19 18 14   ALKPHOS  --   --  109 95 99  BILITOT  --   --  1.6* 1.2 1.3*  PROT  --   --  5.4* 5.4* 5.7*  ALBUMIN 2.3* 2.2* 2.0* 1.9* 2.0*   No results for input(s): LIPASE, AMYLASE in the last 168 hours. Recent Labs  Lab 02/17/20 0443  AMMONIA 19   Coagulation Profile: No results for input(s): INR, PROTIME in the last 168 hours. Cardiac Enzymes: No results for input(s): CKTOTAL, CKMB, CKMBINDEX, TROPONINI in the last 168 hours. BNP (last 3 results) No results for input(s): PROBNP in the last 8760 hours. HbA1C: No results for input(s): HGBA1C in the last 72 hours. CBG: Recent Labs  Lab 02/19/20 1809 02/19/20 2122 02/20/20 0608 02/20/20 0825 02/20/20 1059  GLUCAP 170* 245* 201* 191* 177*   Lipid Profile: Recent Labs    02/18/20 1425  CHOL 54  HDL 20*  LDLCALC 12  TRIG 112  CHOLHDL 2.7   Thyroid Function Tests: No results for input(s): TSH, T4TOTAL, FREET4, T3FREE, THYROIDAB in the last 72 hours. Anemia Panel: Recent Labs    02/17/20 1410   VITAMINB12 1,216*   Sepsis Labs: No results for input(s): PROCALCITON, LATICACIDVEN in the last 168 hours.  No results found for this or any previous visit (from the past 240 hour(s)).       Radiology Studies: DG CHEST PORT 1 VIEW  Result Date: 02/20/2020 CLINICAL DATA:  Dialysis catheter placement EXAM: PORTABLE CHEST 1 VIEW COMPARISON:  February 09, 2020 FINDINGS: Central catheter tip is in the right atrium. No pneumothorax. There is cardiomegaly with pulmonary venous hypertension. There is interstitial edema with small right pleural effusion. No consolidation. No adenopathy evident. There is aortic atherosclerosis. No bone lesions. IMPRESSION: Central catheter tip in right atrium. No pneumothorax. There is cardiomegaly with pulmonary vascular congestion. Small right pleural effusion with interstitial edema suggests underlying degree of congestive heart failure. Aortic Atherosclerosis (ICD10-I70.0). Electronically Signed   By: Lowella Grip III M.D.   On: 02/20/2020 11:01   DG Swallowing Func-Speech Pathology  Result Date: 02/19/2020 Objective Swallowing Evaluation: Type of Study: MBS-Modified Barium Swallow Study  Patient Details Name: [name] MRN: [id number] Date of Birth: 1951-03-12 Today's Date: 02/19/2020 Time: SLP Start Time (ACUTE ONLY): 6270 -SLP Stop Time (ACUTE ONLY): 1255 SLP Time Calculation (min) (ACUTE ONLY): 10 min Past Medical History: Past Medical History: Diagnosis Date . Anemia  . Arthritis   "right shoulder" (12/19/2016) . CAD S/P percutaneous coronary angioplasty 5/110   status post PCI to the RCA for inferior STEMI . Cervical cancer ([place])  . Chronic diastolic heart failure, NYHA class 2 ([place])   Echocardiogram 06/2014:  Technically difficult study. Normal LV size with mild LVH. EF 55-60%. Moderate diastolic dysfunction, normal RV size and function. Likely aortic sclerosis without stenosis . Chronic lower back pain  . CKD (chronic kidney disease), stage IV ([place])   Recent  acute on chronic exacerbation in July 2014 . Depression  . Diabetes mellitus, type II, insulin dependent ([place])   With complications - CAD, CVA, peripheral ulcer , . Diabetic foot ulcer ([place])  . Diabetic peripheral neuropathy associated with type  2 diabetes mellitus ([place])  . Dry gangrene (HCC)   Left second toe; now on Sole of L foot --  s/p L BKA . History of blood transfusion   "w/hysterectomy"  . Hyperlipidemia LDL goal <70  . Hypertension associated with diabetes ([place])  . Obesity, Class III, BMI 40-49.9 (morbid obesity) (HCC)   BMI 43; 5' 2',  235 pounds 6.4 ounces . On home oxygen therapy   "2.5L prn; sometimes as little as once/month" (12/19/2016) . PAD (peripheral artery disease) ([place])    -- Most recent Dopplers April 2016 Status post left BKA. Known right PTA occlusion . Pneumonia   "childhood" . ST elevation myocardial infarction (STEMI) of inferior wall ([place])  03/2009  Ostial/proximal RCA occlusion treated with BMS stent . Stroke/cerebrovascular accident ([place]) 03/18/2013; 12/19/2016  a. Right-sided weakness, mostly with balance issues and only mild weakness;; b. Slurring speach -- L Posterior Putamen CVA Past Surgical History: Past Surgical History: Procedure Laterality Date . ABDOMINAL HYSTERECTOMY   . AMPUTATION Left 08/07/2013  Procedure: left midfoot amputation;  Surgeon: Marianna Payment, MD;  Location: [facility];  Service: Orthopedics;  Laterality: Left; . AMPUTATION Left 08/09/2013  Procedure: AMPUTATION BELOW KNEE;  Surgeon: Marianna Payment, MD;  Location: [facility];  Service: Orthopedics;  Laterality: Left; . AV FISTULA PLACEMENT Left 12/03/2014  Procedure: ARTERIOVENOUS (AV) FISTULA CREATION;  Surgeon: Angelia Mould, MD;  Location: [facility];  Service: Vascular;  Laterality: Left; . AV FISTULA PLACEMENT Right 08/12/2019  Procedure: ARTERIOVENOUS (AV) FISTULA CREATION;  Surgeon: Marty Heck, MD;  Location: [facility];  Service: Vascular;  Laterality: Right; . CARDIAC CATHETERIZATION  03/31/2009   Proximal RCA thrombotic occlusion (inferior STEMI) other coronaries but in a codominant system . EYE SURGERY    "bleeding; vessels leaking" . [facility]  "broke her leg in 2 places when she was young" . IR FLUORO GUIDE CV LINE RIGHT  02/12/2020 . IR US GUIDE VASC ACCESS RIGHT  02/12/2020 . LEG SURGERY    hole in bone( during Childhood) . LEG TENDON SURGERY Right 2000s  patient fell @ [facility] . PERCUTANEOUS CORONARY STENT INTERVENTION (PCI-S)  03/31/2009  PTCA , bare-metal stent 3.5 mm x 24 mm ostium of RCA ,EF 55% . TRANSTHORACIC ECHOCARDIOGRAM  06/2014; 12/2016  a. Technically difficult study. Normal LV size with mild LVH. EF 55-60%. Mod - GR 2 DD. Nl RV size & fxn. ~ Aortic Sclerosis w/ stenosis;; b. LVEF 65-70%, GR 1-2 DD. No RWMA, Mod LA Dilation HPI: Pt is a 69 y/o female admitted 02/09/20 secondary to RLE cellulitis and volume overload. PMH includes CAD s/p stent, DM, CKD, CVA, dCHF, L BKA. Pt had a previous swallow evaluation in 2014 with functional appearing swallow until challenged with large, consecutive boluses. Regular solids and thin liquids via small, single sips recommended at that time. MRI 4/1: Small acute to subacute infarction of the left corona radiata.  Subjective: pt drowsy, initially declines all POs offered but does take a small amount when given encouragement Assessment / Plan / Recommendation CHL IP CLINICAL IMPRESSIONS 02/19/2020 Clinical Impression Pt presents with mild oropharyngeal dysphagia characterized by impaired A-P transport, reduced lingual retraction, impaired bolus cohesion, and a pharyngeal delay. She demonstrated premature spillage to the level of the valleculae, vallecular residue, and occasional transient penetration (PAS 2) with thin liquids. A dyspahgia 2 diet with thin liquids is recommended at this time and SLP will follow pt to ensure diet tolerance.  SLP Visit Diagnosis Dysphagia, oropharyngeal phase (R13.12)  Attention and concentration deficit following -- Frontal  lobe and executive function deficit following -- Impact on safety and function Moderate aspiration risk;Risk for inadequate nutrition/hydration   CHL IP TREATMENT RECOMMENDATION 02/19/2020 Treatment Recommendations Therapy as outlined in treatment plan below   Prognosis 02/19/2020 Prognosis for Safe Diet Advancement Fair Barriers to Reach Goals Cognitive deficits Barriers/Prognosis Comment -- CHL IP DIET RECOMMENDATION 02/19/2020 SLP Diet Recommendations Dysphagia 2 (Fine chop) solids;Thin liquid Liquid Administration via Cup;Straw Medication Administration Whole meds with puree Compensations Slow rate;Small sips/bites;Follow solids with liquid Postural Changes Seated upright at 90 degrees;Remain semi-upright after after feeds/meals (Comment)   CHL IP OTHER RECOMMENDATIONS 02/19/2020 Recommended Consults -- Oral Care Recommendations Oral care BID Other Recommendations Have oral suction available   CHL IP FOLLOW UP RECOMMENDATIONS 02/19/2020 Follow up Recommendations Skilled Nursing facility   [facility] IP FREQUENCY AND DURATION 02/19/2020 Speech Therapy Frequency (ACUTE ONLY) min 2x/week Treatment Duration 2 weeks      CHL IP ORAL PHASE 02/19/2020 Oral Phase Impaired Oral - Pudding Teaspoon -- Oral - Pudding Cup -- Oral - Honey Teaspoon -- Oral - Honey Cup -- Oral - Nectar Teaspoon -- Oral - Nectar Cup -- Oral - Nectar Straw -- Oral - Thin Teaspoon -- Oral - Thin Cup -- Oral - Thin Straw Premature spillage Oral - Puree Reduced posterior propulsion Oral - Mech Soft Impaired mastication Oral - Regular -- Oral - Multi-Consistency -- Oral - Pill Reduced posterior propulsion Oral Phase - Comment --  CHL IP PHARYNGEAL PHASE 02/19/2020 Pharyngeal Phase Impaired Pharyngeal- Pudding Teaspoon -- Pharyngeal -- Pharyngeal- Pudding Cup -- Pharyngeal -- Pharyngeal- Honey Teaspoon -- Pharyngeal -- Pharyngeal- Honey Cup -- Pharyngeal -- Pharyngeal- Nectar Teaspoon -- Pharyngeal -- Pharyngeal- Nectar Cup -- Pharyngeal -- Pharyngeal- Nectar Straw --  Pharyngeal -- Pharyngeal- Thin Teaspoon -- Pharyngeal -- Pharyngeal- Thin Cup -- Pharyngeal -- Pharyngeal- Thin Straw Penetration/Aspiration during swallow;Reduced tongue base retraction Pharyngeal Material enters airway, remains ABOVE vocal cords then ejected out Pharyngeal- Puree Reduced tongue base retraction;Pharyngeal residue - valleculae Pharyngeal -- Pharyngeal- Mechanical Soft Reduced tongue base retraction;Pharyngeal residue - valleculae Pharyngeal -- Pharyngeal- Regular -- Pharyngeal -- Pharyngeal- Multi-consistency -- Pharyngeal -- Pharyngeal- Pill -- Pharyngeal -- Pharyngeal Comment --  CHL IP CERVICAL ESOPHAGEAL PHASE 02/19/2020 Cervical Esophageal Phase WFL Pudding Teaspoon -- Pudding Cup -- Honey Teaspoon -- Honey Cup -- Nectar Teaspoon -- Nectar Cup -- Nectar Straw -- Thin Teaspoon -- Thin Cup -- Thin Straw -- Puree -- Mechanical Soft -- Regular -- Multi-consistency -- Pill -- Cervical Esophageal Comment -- Shanika I. Hardin Negus, [place], [place] Office number [phone] Pager [place] 02/19/2020, 2:17 PM              DG Fluoro Guide CV Line-No Report  Result Date: 02/20/2020 Fluoroscopy was utilized by the requesting physician.  No radiographic interpretation.        Scheduled Meds: . sodium chloride   Intravenous Once  . allopurinol  50 mg Oral Daily  . antiseptic oral rinse  15 mL Mouth Rinse BID  . atorvastatin  80 mg Oral q1800  . buPROPion  100 mg Oral TID  . carvedilol  25 mg Oral Q12H  . Chlorhexidine Gluconate Cloth  6 each Topical Daily  . clopidogrel  75 mg Oral Daily  . darbepoetin (ARANESP) injection - NON-DIALYSIS  60 mcg Subcutaneous Q Thu-1800  . feeding supplement (NEPRO CARB STEADY)  237 mL Oral BID BM  . folic acid  1 mg Oral Daily  .  insulin aspart  0-6 Units Subcutaneous TID WC  . lactulose  20 g Oral BID  . mirtazapine  7.5 mg Oral QHS  . multivitamin  1 tablet Oral QHS  . omega-3 acid ethyl esters  1 g Oral  BID  . sodium chloride flush  3 mL Intravenous Q12H   Continuous Infusions: . sodium chloride Stopped (02/10/20 1409)  . ferumoxytol       LOS: 10 days   Time spent= 35 mins    Derek Laughter Arsenio Loader, MD [facility]  If 7PM-7AM, please contact night-coverage  02/20/2020, 11:10 AM

## 2020-02-21 LAB — GLUCOSE, CAPILLARY
Glucose-Capillary: 198 mg/dL — ABNORMAL HIGH (ref 70–99)
Glucose-Capillary: 257 mg/dL — ABNORMAL HIGH (ref 70–99)
Glucose-Capillary: 320 mg/dL — ABNORMAL HIGH (ref 70–99)
Glucose-Capillary: 282 mg/dL — ABNORMAL HIGH (ref 70–99)

## 2020-02-21 MED ORDER — HEPARIN SODIUM (PORCINE) 1000 UNIT/ML IJ SOLN
INTRAMUSCULAR | Status: AC
  Filled 2020-02-21: qty 4

## 2020-02-21 MED ORDER — INSULIN ASPART 100 UNIT/ML ~~LOC~~ SOLN
4.0000 [IU] | Freq: Once | SUBCUTANEOUS | Status: AC
  Administered 2020-02-21: 4 [IU] via SUBCUTANEOUS

## 2020-02-21 NOTE — Progress Notes (Signed)
Called to have unna boot replaced and am told the hospital is out of supplies. Will make the MD aware.

## 2020-02-21 NOTE — Progress Notes (Signed)
PROGRESS NOTE    [name]  ZSW:[id number] DOB: 1951-01-07 DOA: 02/09/2020 PCP: Katherina Mires, MD   Brief Narrative:  69 year old female with history of DM-2, CAD s/p BMS to RCA, CVA, thrombocytopenia, diastolic CHF, CKD-5 with LUE aVF placed in 07/2019, PAD s/p left BKA presenting with progressive redness and edema of right lower extremity, and admitted for right lower extremity cellulitis, fluid overload and acute on chronic diastolic CHF.  Patient was started on IV vancomycin for cellulitis.    Developed AKI therefore nephrology team consulted initially started on Lasix and gentle hydration.  Renal function failed to improve therefore started on hemodialysis on 3/27.  Right IJ tunnel catheter placed 02/20/2020.   Assessment & Plan:   Principal Problem:   Acute on chronic diastolic CHF (congestive heart failure) (HCC) Active Problems:   Acute renal failure superimposed on stage 4 chronic kidney disease (HCC)   Type 2 diabetes mellitus with renal manifestations (HCC)   CAD S/P PCI TO RCA for Inferior STEMI. BMS   CKD (chronic kidney disease), stage IV (HCC)   Anemia in chronic kidney disease   CVA (cerebral vascular accident) ([place])   AKI (acute kidney injury) ([place])   Cellulitis of right leg   Thrombocytopenia (HCC)   Hyperbilirubinemia   Xerostomia   Advanced care planning/counseling discussion   Full code status   Dry skin   Physical debility   Palliative care by specialist   Goals of care, counseling/discussion   Acute CVA (cerebrovascular accident) ([place])  Acute kidney injury on CKD stage V Anemia of chronic disease -Getting dialysis via temporary dialysis catheter.  Nephrology social worker spoke with the family regarding outpatient arrangements, until she gets her strength back it will be difficult to get this done. -Right IJ tunneled catheter placed 02/20/2020 -Hemodialysis per nephrology team -BUN and creatinine improving. -Received 1 unit PRBC 4/1  Acute on chronic  diastolic congestive heart failure, ejection fraction 65% -Volume status is stable with dialysis  Right lower extremity cellulitis, resolved -Improved, completed 7 days of doxycycline, ended 3/22  Acute metabolic encephalopathy Acute/subacute CVA, corona radiata -Combination of uremia/hyperammonia.  No focal neuro deficits.  CT of the head is negative.  TSH, B12 are normal -Lactulose twice daily. -Continue supportive care, avoid sedative medication. -Continue Plavix and statin -Spoke with Dr Cheral Marker on 4/1 who reviewed patient's MRI.  No further input at this time.  Continue current management. -Hemoglobin A1c 6.9, LDL 12  Diabetes mellitus type 2 controlled -Hemoglobin A1c 6.0.  Continue current regimen.  Normocytic anemia Mild folate deficiency -Continue folic acid, insulin sliding scale and Accu-Cheks  History of peripheral arterial disease status post left-sided BKA -Continue Plavix, Lipitor and Zetia  Coronary artery disease status post bare-metal stent to RCA in 2010 -Continue home cardiac meds  Lack of appetite/poor oral intake Mild protein calorie malnutrition -Continue mirtazapine.  Encourage p.o. intake.  Dietitian/speech and swallow working with her.MBS planned today  Goals of care discussion-palliative care team following  DVT prophylaxis: None secondary to thrombocytopenia Code Status: Full code Family Communication: None Disposition Plan:   Patient From= home  Patient Anticipated D/C place= once cleared by nephrology, her main issue is being strong enough to get seat for outpatient hemodialysis.  Complicated disposition, family aware.  Subjective: No complaints laying in the bed.  Continues to tell me she wants to go home but I explained to her she is more strong enough to go home or to tolerate outpatient dialysis  Review of Systems Otherwise negative  except as per HPI, including: Constitutional: Not in acute distress Respiratory: Clear to auscultation  bilaterally Cardiovascular: Normal sinus rhythm, no rubs Abdomen: Nontender nondistended good bowel sounds Musculoskeletal: No edema noted Skin: No rashes seen Neurologic: CN 2-12 grossly intact.  And nonfocal Psychiatric: Normal judgment and insight. Alert and oriented x 3. Normal mood.     Examination: Constitutional: Chronically very ill and frail appearing.  Poor dentition. Respiratory: Clear to auscultation bilaterally Cardiovascular: Normal sinus rhythm, no rubs Abdomen: Nontender nondistended good bowel sounds Musculoskeletal: No edema noted Skin: No rashes seen Neurologic: CN 2-12 grossly intact.  And nonfocal Psychiatric: Poor judgment and insight alert to name and place  Right IJ tunnel catheter noted  Objective: Vitals:   02/21/20 0202 02/21/20 0236 02/21/20 0300 02/21/20 0901  BP: (!) 120/55 (!) 131/50  (!) 137/52  Pulse:  74  88  Resp:  17 18 16   Temp: 98.1 F (36.7 C) (!) 97.5 F (36.4 C)  99.5 F (37.5 C)  TempSrc: Oral Oral  Oral  SpO2: 100% 100%  94%  Weight: 113.5 kg     Height:        Intake/Output Summary (Last 24 hours) at 02/21/2020 1150 Last data filed at 02/21/2020 0900 Gross per 24 hour  Intake 940 ml  Output 3000 ml  Net -2060 ml   Filed Weights   02/20/20 0148 02/20/20 2250 02/21/20 0202  Weight: 115.2 kg 115.5 kg 113.5 kg     Data Reviewed:   CBC: Recent Labs  Lab 02/16/20 1230 02/16/20 1230 02/17/20 0443 02/17/20 1410 02/18/20 0511 02/18/20 2352 02/19/20 0410 02/20/20 0500  WBC 11.3*  --  11.5*  --  11.2*  --  10.5 9.3  HGB 7.5*   < > 7.3*  --  7.3* 8.0* 8.0* 7.7*  HCT 25.1*   < > 24.4* 23.6* 24.8* 25.8* 26.3* 25.9*  MCV 103.3*  --  102.5*  --  104.2*  --  100.4* 100.4*  PLT 93*  --  84*  --  90*  --  79* 81*   < > = values in this interval not displayed.   Basic Metabolic Panel: Recent Labs  Lab 02/15/20 0554 02/15/20 1357 02/16/20 0506 02/17/20 0443 02/18/20 0511 02/19/20 0410 02/20/20 0500  NA  --  141  --   135 142 138 139  K  --  3.8  --  3.6 4.0 3.4* 3.6  CL  --  103  --  100 103 100 99  CO2  --  23  --  26 25 28 27   GLUCOSE  --  143*  --  170* 156* 151* 215*  BUN  --  61*  --  26* 39* 16 33*  CREATININE  --  3.76*  --  2.68* 4.19* 2.46* 4.03*  CALCIUM  --  8.5*  --  8.2* 8.7* 8.4* 8.4*  MG 1.9  --  1.7  --  1.8 1.6* 1.7  PHOS  --  2.7  --   --   --   --   --    GFR: Estimated Creatinine Clearance: 15.9 mL/min (A) (by C-G formula based on SCr of 4.03 mg/dL (H)). Liver Function Tests: Recent Labs  Lab 02/15/20 1357 02/17/20 0443 02/18/20 0511 02/19/20 0410  AST  --  14* 12* 11*  ALT  --  19 18 14   ALKPHOS  --  109 95 99  BILITOT  --  1.6* 1.2 1.3*  PROT  --  5.4* 5.4* 5.7*  ALBUMIN 2.2* 2.0* 1.9* 2.0*   No results for input(s): LIPASE, AMYLASE in the last 168 hours. Recent Labs  Lab 02/17/20 0443  AMMONIA 19   Coagulation Profile: No results for input(s): INR, PROTIME in the last 168 hours. Cardiac Enzymes: No results for input(s): CKTOTAL, CKMB, CKMBINDEX, TROPONINI in the last 168 hours. BNP (last 3 results) No results for input(s): PROBNP in the last 8760 hours. HbA1C: No results for input(s): HGBA1C in the last 72 hours. CBG: Recent Labs  Lab 02/20/20 1059 02/20/20 1616 02/20/20 2126 02/21/20 0626 02/21/20 1121  GLUCAP 177* 215* 280* 198* 257*   Lipid Profile: Recent Labs    02/18/20 1425  CHOL 54  HDL 20*  LDLCALC 12  TRIG 112  CHOLHDL 2.7   Thyroid Function Tests: No results for input(s): TSH, T4TOTAL, FREET4, T3FREE, THYROIDAB in the last 72 hours. Anemia Panel: No results for input(s): VITAMINB12, FOLATE, FERRITIN, TIBC, IRON, RETICCTPCT in the last 72 hours. Sepsis Labs: No results for input(s): PROCALCITON, LATICACIDVEN in the last 168 hours.  No results found for this or any previous visit (from the past 240 hour(s)).       Radiology Studies: DG CHEST PORT 1 VIEW  Result Date: 02/20/2020 CLINICAL DATA:  Dialysis catheter placement  EXAM: PORTABLE CHEST 1 VIEW COMPARISON:  February 09, 2020 FINDINGS: Central catheter tip is in the right atrium. No pneumothorax. There is cardiomegaly with pulmonary venous hypertension. There is interstitial edema with small right pleural effusion. No consolidation. No adenopathy evident. There is aortic atherosclerosis. No bone lesions. IMPRESSION: Central catheter tip in right atrium. No pneumothorax. There is cardiomegaly with pulmonary vascular congestion. Small right pleural effusion with interstitial edema suggests underlying degree of congestive heart failure. Aortic Atherosclerosis (ICD10-I70.0). Electronically Signed   By: Lowella Grip III M.D.   On: 02/20/2020 11:01   DG Swallowing Func-Speech Pathology  Result Date: 02/19/2020 Objective Swallowing Evaluation: Type of Study: MBS-Modified Barium Swallow Study  Patient Details Name: Briyana Badman MRN: [id number] Date of Birth: December 02, 1950 Today's Date: 02/19/2020 Time: SLP Start Time (ACUTE ONLY): 2122 -SLP Stop Time (ACUTE ONLY): 1255 SLP Time Calculation (min) (ACUTE ONLY): 10 min Past Medical History: Past Medical History: Diagnosis Date . Anemia  . Arthritis   "right shoulder" (12/19/2016) . CAD S/P percutaneous coronary angioplasty 5/110   status post PCI to the RCA for inferior STEMI . Cervical cancer ([place])  . Chronic diastolic heart failure, NYHA class 2 ([place])   Echocardiogram 06/2014:  Technically difficult study. Normal LV size with mild LVH. EF 55-60%. Moderate diastolic dysfunction, normal RV size and function. Likely aortic sclerosis without stenosis . Chronic lower back pain  . CKD (chronic kidney disease), stage IV ([place])   Recent acute on chronic exacerbation in July 2014 . Depression  . Diabetes mellitus, type II, insulin dependent ([place])   With complications - CAD, CVA, peripheral ulcer , . Diabetic foot ulcer ([place])  . Diabetic peripheral neuropathy associated with type 2 diabetes mellitus ([place])  . Dry gangrene (HCC)   Left second toe; now on  Sole of L foot --  s/p L BKA . History of blood transfusion   "w/hysterectomy"  . Hyperlipidemia LDL goal <70  . Hypertension associated with diabetes ([place])  . Obesity, Class III, BMI 40-49.9 (morbid obesity) (HCC)   BMI 43; 5' 2',  235 pounds 6.4 ounces . On home oxygen therapy   "2.5L prn; sometimes as little as once/month" (12/19/2016) . PAD (peripheral artery disease) ([place])    --  Most recent Dopplers April 2016 Status post left BKA. Known right PTA occlusion . Pneumonia   "childhood" . ST elevation myocardial infarction (STEMI) of inferior wall ([place])  03/2009  Ostial/proximal RCA occlusion treated with BMS stent . Stroke/cerebrovascular accident ([place]) 03/18/2013; 12/19/2016  a. Right-sided weakness, mostly with balance issues and only mild weakness;; b. Slurring speach -- L Posterior Putamen CVA Past Surgical History: Past Surgical History: Procedure Laterality Date . ABDOMINAL HYSTERECTOMY   . AMPUTATION Left 08/07/2013  Procedure: left midfoot amputation;  Surgeon: Marianna Payment, MD;  Location: [facility];  Service: Orthopedics;  Laterality: Left; . AMPUTATION Left 08/09/2013  Procedure: AMPUTATION BELOW KNEE;  Surgeon: Marianna Payment, MD;  Location: [facility];  Service: Orthopedics;  Laterality: Left; . AV FISTULA PLACEMENT Left 12/03/2014  Procedure: ARTERIOVENOUS (AV) FISTULA CREATION;  Surgeon: Angelia Mould, MD;  Location: [facility];  Service: Vascular;  Laterality: Left; . AV FISTULA PLACEMENT Right 08/12/2019  Procedure: ARTERIOVENOUS (AV) FISTULA CREATION;  Surgeon: Marty Heck, MD;  Location: [facility];  Service: Vascular;  Laterality: Right; . CARDIAC CATHETERIZATION  03/31/2009  Proximal RCA thrombotic occlusion (inferior STEMI) other coronaries but in a codominant system . EYE SURGERY    "bleeding; vessels leaking" . [place]  "broke her leg in 2 places when she was young" . IR FLUORO GUIDE CV LINE RIGHT  02/12/2020 . IR US GUIDE VASC ACCESS RIGHT  02/12/2020 . LEG SURGERY    hole in  bone( during Childhood) . LEG TENDON SURGERY Right 2000s  patient fell @ [place] . PERCUTANEOUS CORONARY STENT INTERVENTION (PCI-S)  03/31/2009  PTCA , bare-metal stent 3.5 mm x 24 mm ostium of RCA ,EF 55% . TRANSTHORACIC ECHOCARDIOGRAM  06/2014; 12/2016  a. Technically difficult study. Normal LV size with mild LVH. EF 55-60%. Mod - GR 2 DD. Nl RV size & fxn. ~ Aortic Sclerosis w/ stenosis;; b. LVEF 65-70%, GR 1-2 DD. No RWMA, Mod LA Dilation HPI: Pt is a 69 y/o female admitted 02/09/20 secondary to RLE cellulitis and volume overload. PMH includes CAD s/p stent, DM, CKD, CVA, dCHF, L BKA. Pt had a previous swallow evaluation in 2014 with functional appearing swallow until challenged with large, consecutive boluses. Regular solids and thin liquids via small, single sips recommended at that time. MRI 4/1: Small acute to subacute infarction of the left corona radiata.  Subjective: pt drowsy, initially declines all POs offered but does take a small amount when given encouragement Assessment / Plan / Recommendation CHL IP CLINICAL IMPRESSIONS 02/19/2020 Clinical Impression Pt presents with mild oropharyngeal dysphagia characterized by impaired A-P transport, reduced lingual retraction, impaired bolus cohesion, and a pharyngeal delay. She demonstrated premature spillage to the level of the valleculae, vallecular residue, and occasional transient penetration (PAS 2) with thin liquids. A dyspahgia 2 diet with thin liquids is recommended at this time and SLP will follow pt to ensure diet tolerance.  SLP Visit Diagnosis Dysphagia, oropharyngeal phase (R13.12) Attention and concentration deficit following -- Frontal lobe and executive function deficit following -- Impact on safety and function Moderate aspiration risk;Risk for inadequate nutrition/hydration   CHL IP TREATMENT RECOMMENDATION 02/19/2020 Treatment Recommendations Therapy as outlined in treatment plan below   Prognosis 02/19/2020 Prognosis for Safe Diet Advancement Fair  Barriers to Reach Goals Cognitive deficits Barriers/Prognosis Comment -- CHL IP DIET RECOMMENDATION 02/19/2020 SLP Diet Recommendations Dysphagia 2 (Fine chop) solids;Thin liquid Liquid Administration via Cup;Straw Medication Administration Whole meds with puree Compensations Slow rate;Small sips/bites;Follow solids with liquid  Postural Changes Seated upright at 90 degrees;Remain semi-upright after after feeds/meals (Comment)   CHL IP OTHER RECOMMENDATIONS 02/19/2020 Recommended Consults -- Oral Care Recommendations Oral care BID Other Recommendations Have oral suction available   CHL IP FOLLOW UP RECOMMENDATIONS 02/19/2020 Follow up Recommendations Skilled Nursing facility   [facility] IP FREQUENCY AND DURATION 02/19/2020 Speech Therapy Frequency (ACUTE ONLY) min 2x/week Treatment Duration 2 weeks      CHL IP ORAL PHASE 02/19/2020 Oral Phase Impaired Oral - Pudding Teaspoon -- Oral - Pudding Cup -- Oral - Honey Teaspoon -- Oral - Honey Cup -- Oral - Nectar Teaspoon -- Oral - Nectar Cup -- Oral - Nectar Straw -- Oral - Thin Teaspoon -- Oral - Thin Cup -- Oral - Thin Straw Premature spillage Oral - Puree Reduced posterior propulsion Oral - Mech Soft Impaired mastication Oral - Regular -- Oral - Multi-Consistency -- Oral - Pill Reduced posterior propulsion Oral Phase - Comment --  CHL IP PHARYNGEAL PHASE 02/19/2020 Pharyngeal Phase Impaired Pharyngeal- Pudding Teaspoon -- Pharyngeal -- Pharyngeal- Pudding Cup -- Pharyngeal -- Pharyngeal- Honey Teaspoon -- Pharyngeal -- Pharyngeal- Honey Cup -- Pharyngeal -- Pharyngeal- Nectar Teaspoon -- Pharyngeal -- Pharyngeal- Nectar Cup -- Pharyngeal -- Pharyngeal- Nectar Straw -- Pharyngeal -- Pharyngeal- Thin Teaspoon -- Pharyngeal -- Pharyngeal- Thin Cup -- Pharyngeal -- Pharyngeal- Thin Straw Penetration/Aspiration during swallow;Reduced tongue base retraction Pharyngeal Material enters airway, remains ABOVE vocal cords then ejected out Pharyngeal- Puree Reduced tongue base retraction;Pharyngeal  residue - valleculae Pharyngeal -- Pharyngeal- Mechanical Soft Reduced tongue base retraction;Pharyngeal residue - valleculae Pharyngeal -- Pharyngeal- Regular -- Pharyngeal -- Pharyngeal- Multi-consistency -- Pharyngeal -- Pharyngeal- Pill -- Pharyngeal -- Pharyngeal Comment --  CHL IP CERVICAL ESOPHAGEAL PHASE 02/19/2020 Cervical Esophageal Phase WFL Pudding Teaspoon -- Pudding Cup -- Honey Teaspoon -- Honey Cup -- Nectar Teaspoon -- Nectar Cup -- Nectar Straw -- Thin Teaspoon -- Thin Cup -- Thin Straw -- Puree -- Mechanical Soft -- Regular -- Multi-consistency -- Pill -- Cervical Esophageal Comment -- Shanika I. Hardin Negus, [place], [place] Office number [phone] Pager [place] 02/19/2020, 2:17 PM              DG Fluoro Guide CV Line-No Report  Result Date: 02/20/2020 Fluoroscopy was utilized by the requesting physician.  No radiographic interpretation.        Scheduled Meds: . sodium chloride   Intravenous Once  . allopurinol  50 mg Oral Daily  . antiseptic oral rinse  15 mL Mouth Rinse BID  . atorvastatin  80 mg Oral q1800  . buPROPion  100 mg Oral TID  . carvedilol  25 mg Oral Q12H  . Chlorhexidine Gluconate Cloth  6 each Topical Daily  . clopidogrel  75 mg Oral Daily  . darbepoetin (ARANESP) injection - NON-DIALYSIS  60 mcg Subcutaneous Q Thu-1800  . feeding supplement (NEPRO CARB STEADY)  237 mL Oral BID BM  . folic acid  1 mg Oral Daily  . insulin aspart  0-6 Units Subcutaneous TID WC  . lactulose  20 g Oral BID  . mirtazapine  7.5 mg Oral QHS  . multivitamin  1 tablet Oral QHS  . omega-3 acid ethyl esters  1 g Oral BID  . sodium chloride flush  3 mL Intravenous Q12H   Continuous Infusions: . sodium chloride Stopped (02/10/20 1409)  . ferumoxytol       LOS: 11 days   Time spent= 35 mins    Zymier Rodgers Arsenio Loader, MD [facility]  If 7PM-7AM, please contact night-coverage  02/21/2020, 11:50 AM   PROGRESS  NOTE    [name]  SJG:[id number] DOB: Mar 21, 1951 DOA: 02/09/2020 PCP: Katherina Mires, MD   Brief Narrative:  69 year old female with history of DM-2, CAD s/p BMS to RCA, CVA, thrombocytopenia, diastolic CHF, CKD-5 with LUE aVF placed in 07/2019, PAD s/p left BKA presenting with progressive redness and edema of right lower extremity, and admitted for right lower extremity cellulitis, fluid overload and acute on chronic diastolic CHF.  Patient was started on IV vancomycin for cellulitis.    Developed AKI therefore nephrology team consulted initially started on Lasix and gentle hydration.  Renal function failed to improve therefore started on hemodialysis on 3/27.  Right IJ tunnel catheter placed 02/20/2020.   Assessment & Plan:   Principal Problem:   Acute on chronic diastolic CHF (congestive heart failure) (HCC) Active Problems:   Acute renal failure superimposed on stage 4 chronic kidney disease (HCC)   Type 2 diabetes mellitus with renal manifestations (HCC)   CAD S/P PCI TO RCA for Inferior STEMI. BMS   CKD (chronic kidney disease), stage IV (HCC)   Anemia in chronic kidney disease   CVA (cerebral vascular accident) ([place])   AKI (acute kidney injury) ([place])   Cellulitis of right leg   Thrombocytopenia (HCC)   Hyperbilirubinemia   Xerostomia   Advanced care planning/counseling discussion   Full code status   Dry skin   Physical debility   Palliative care by specialist   Goals of care, counseling/discussion   Acute CVA (cerebrovascular accident) ([place])  Acute kidney injury on CKD stage V Anemia of chronic disease -Getting dialysis via temporary dialysis catheter.  Nephrology social worker spoke with the family regarding outpatient arrangements, until she gets her strength back it will be difficult to get this done. -Right IJ tunneled catheter placed 02/20/2020 -Hemodialysis per nephrology team -BUN and creatinine improving. -Status post 1 unit PRBC 4/1.  Acute on chronic  diastolic congestive heart failure, ejection fraction 65% -Volume status is stable for now.  Stable with dialysis  Right lower extremity cellulitis, resolved -Improved, completed 7 days of doxycycline, ended 6/54  Acute metabolic encephalopathy Acute/subacute CVA, corona radiata -Combination of uremia/hyperammonia.  No focal neuro deficits.  CT of the head is negative.  TSH, B12 are normal -Lactulose twice daily -Continue supportive care, avoid sedative medication. -Continue Plavix and statin -Spoke with Dr Cheral Marker on 4/1 who reviewed patient's MRI.  No further input at this time.  Continue current management. -Hemoglobin A1c 6.9, LDL 12  Diabetes mellitus type 2 controlled -Hemoglobin A1c 6.0.  Continue current regimen.  Normocytic anemia Mild folate deficiency -Continue folic acid, insulin sliding scale and Accu-Cheks  History of peripheral arterial disease status post left-sided BKA -Continue Plavix, Lipitor and Zetia  Coronary artery disease status post bare-metal stent to RCA in 2010 -Continue home cardiac meds  Lack of appetite/poor oral intake Mild protein calorie malnutrition -Continue mirtazapine.  Encourage p.o. intake.  Dietitian/speech and swallow working with her.MBS planned today  Goals of care discussion-palliative care team following    DVT prophylaxis: None secondary to thrombocytopenia Code Status: Full code Family Communication: None today Disposition Plan:   Patient From= home  Patient Anticipated D/C place= once cleared by nephrology, her main issue is being strong enough to get seat for outpatient hemodialysis.  Complicated disposition, family aware.  Subjective: No complaints this morning.  She is resting but easily arousable.  Review of Systems Otherwise negative except as per  HPI, including: General = no fevers, chills, dizziness,  fatigue HEENT/EYES = negative for loss of vision, double vision, blurred vision,  sore throa Cardiovascular=  negative for chest pain, palpitation Respiratory/lungs= negative for shortness of breath, cough, wheezing; hemoptysis,  Gastrointestinal= negative for nausea, vomiting, abdominal pain Genitourinary= negative for Dysuria MSK = Negative for arthralgia, myalgias Neurology= Negative for headache, numbness, tingling  Psychiatry= Negative for suicidal and homocidal ideation Skin= Negative for Rash   Examination:  Constitutional: Generally weak and chronically ill-appearing.  Not in acute distress at this time. Respiratory: Clear to auscultation bilaterally Cardiovascular: Normal sinus rhythm, no rubs Abdomen: Nontender nondistended good bowel sounds Musculoskeletal: No edema noted Skin: No rashes seen Neurologic: CN 2-12 grossly intact.  And nonfocal Psychiatric: Normal judgment and insight. Alert and oriented x 3. Normal mood.    Right IJ tunnel catheter noted  Objective: Vitals:   02/21/20 0202 02/21/20 0236 02/21/20 0300 02/21/20 0901  BP: (!) 120/55 (!) 131/50  (!) 137/52  Pulse:  74  88  Resp:  17 18 16   Temp: 98.1 F (36.7 C) (!) 97.5 F (36.4 C)  99.5 F (37.5 C)  TempSrc: Oral Oral  Oral  SpO2: 100% 100%  94%  Weight: 113.5 kg     Height:        Intake/Output Summary (Last 24 hours) at 02/21/2020 1150 Last data filed at 02/21/2020 0900 Gross per 24 hour  Intake 940 ml  Output 3000 ml  Net -2060 ml   Filed Weights   02/20/20 0148 02/20/20 2250 02/21/20 0202  Weight: 115.2 kg 115.5 kg 113.5 kg     Data Reviewed:   CBC: Recent Labs  Lab 02/16/20 1230 02/16/20 1230 02/17/20 0443 02/17/20 1410 02/18/20 0511 02/18/20 2352 02/19/20 0410 02/20/20 0500  WBC 11.3*  --  11.5*  --  11.2*  --  10.5 9.3  HGB 7.5*   < > 7.3*  --  7.3* 8.0* 8.0* 7.7*  HCT 25.1*   < > 24.4* 23.6* 24.8* 25.8* 26.3* 25.9*  MCV 103.3*  --  102.5*  --  104.2*  --  100.4* 100.4*  PLT 93*  --  84*  --  90*  --  79* 81*   < > = values in this interval not displayed.   Basic Metabolic  Panel: Recent Labs  Lab 02/15/20 0554 02/15/20 1357 02/16/20 0506 02/17/20 0443 02/18/20 0511 02/19/20 0410 02/20/20 0500  NA  --  141  --  135 142 138 139  K  --  3.8  --  3.6 4.0 3.4* 3.6  CL  --  103  --  100 103 100 99  CO2  --  23  --  26 25 28 27   GLUCOSE  --  143*  --  170* 156* 151* 215*  BUN  --  61*  --  26* 39* 16 33*  CREATININE  --  3.76*  --  2.68* 4.19* 2.46* 4.03*  CALCIUM  --  8.5*  --  8.2* 8.7* 8.4* 8.4*  MG 1.9  --  1.7  --  1.8 1.6* 1.7  PHOS  --  2.7  --   --   --   --   --    GFR: Estimated Creatinine Clearance: 15.9 mL/min (A) (by C-G formula based on SCr of 4.03 mg/dL (H)). Liver Function Tests: Recent Labs  Lab 02/15/20 1357 02/17/20 0443 02/18/20 0511 02/19/20 0410  AST  --  14* 12* 11*  ALT  --  19 18 14   ALKPHOS  --  109 95 99  BILITOT  --  1.6* 1.2 1.3*  PROT  --  5.4* 5.4* 5.7*  ALBUMIN 2.2* 2.0* 1.9* 2.0*   No results for input(s): LIPASE, AMYLASE in the last 168 hours. Recent Labs  Lab 02/17/20 0443  AMMONIA 19   Coagulation Profile: No results for input(s): INR, PROTIME in the last 168 hours. Cardiac Enzymes: No results for input(s): CKTOTAL, CKMB, CKMBINDEX, TROPONINI in the last 168 hours. BNP (last 3 results) No results for input(s): PROBNP in the last 8760 hours. HbA1C: No results for input(s): HGBA1C in the last 72 hours. CBG: Recent Labs  Lab 02/20/20 1059 02/20/20 1616 02/20/20 2126 02/21/20 0626 02/21/20 1121  GLUCAP 177* 215* 280* 198* 257*   Lipid Profile: Recent Labs    02/18/20 1425  CHOL 54  HDL 20*  LDLCALC 12  TRIG 112  CHOLHDL 2.7   Thyroid Function Tests: No results for input(s): TSH, T4TOTAL, FREET4, T3FREE, THYROIDAB in the last 72 hours. Anemia Panel: No results for input(s): VITAMINB12, FOLATE, FERRITIN, TIBC, IRON, RETICCTPCT in the last 72 hours. Sepsis Labs: No results for input(s): PROCALCITON, LATICACIDVEN in the last 168 hours.  No results found for this or any previous visit  (from the past 240 hour(s)).       Radiology Studies: DG CHEST PORT 1 VIEW  Result Date: 02/20/2020 CLINICAL DATA:  Dialysis catheter placement EXAM: PORTABLE CHEST 1 VIEW COMPARISON:  February 09, 2020 FINDINGS: Central catheter tip is in the right atrium. No pneumothorax. There is cardiomegaly with pulmonary venous hypertension. There is interstitial edema with small right pleural effusion. No consolidation. No adenopathy evident. There is aortic atherosclerosis. No bone lesions. IMPRESSION: Central catheter tip in right atrium. No pneumothorax. There is cardiomegaly with pulmonary vascular congestion. Small right pleural effusion with interstitial edema suggests underlying degree of congestive heart failure. Aortic Atherosclerosis (ICD10-I70.0). Electronically Signed   By: Lowella Grip III M.D.   On: 02/20/2020 11:01   DG Swallowing Func-Speech Pathology  Result Date: 02/19/2020 Objective Swallowing Evaluation: Type of Study: MBS-Modified Barium Swallow Study  Patient Details Name: Emelia Sandoval MRN: [id number] Date of Birth: 25-Jun-1951 Today's Date: 02/19/2020 Time: SLP Start Time (ACUTE ONLY): 5573 -SLP Stop Time (ACUTE ONLY): 1255 SLP Time Calculation (min) (ACUTE ONLY): 10 min Past Medical History: Past Medical History: Diagnosis Date . Anemia  . Arthritis   "right shoulder" (12/19/2016) . CAD S/P percutaneous coronary angioplasty 5/110   status post PCI to the RCA for inferior STEMI . Cervical cancer ([place])  . Chronic diastolic heart failure, NYHA class 2 ([place])   Echocardiogram 06/2014:  Technically difficult study. Normal LV size with mild LVH. EF 55-60%. Moderate diastolic dysfunction, normal RV size and function. Likely aortic sclerosis without stenosis . Chronic lower back pain  . CKD (chronic kidney disease), stage IV ([place])   Recent acute on chronic exacerbation in July 2014 . Depression  . Diabetes mellitus, type II, insulin dependent ([place])   With complications - CAD, CVA, peripheral ulcer , .  Diabetic foot ulcer ([place])  . Diabetic peripheral neuropathy associated with type 2 diabetes mellitus ([place])  . Dry gangrene (HCC)   Left second toe; now on Sole of L foot --  s/p L BKA . History of blood transfusion   "w/hysterectomy"  . Hyperlipidemia LDL goal <70  . Hypertension associated with diabetes ([place])  . Obesity, Class III, BMI 40-49.9 (morbid obesity) (HCC)   BMI 43; 5' 2',  235 pounds 6.4 ounces . On home oxygen therapy   "2.5L prn; sometimes as little as once/month" (12/19/2016) . PAD (peripheral artery disease) ([place])    -- Most recent Dopplers April 2016 Status post left BKA. Known right PTA occlusion . Pneumonia   "childhood" . ST elevation myocardial infarction (STEMI) of inferior wall ([place])  03/2009  Ostial/proximal RCA occlusion treated with BMS stent . Stroke/cerebrovascular accident ([place]) 03/18/2013; 12/19/2016  a. Right-sided weakness, mostly with balance issues and only mild weakness;; b. Slurring speach -- L Posterior Putamen CVA Past Surgical History: Past Surgical History: Procedure Laterality Date . ABDOMINAL HYSTERECTOMY   . AMPUTATION Left 08/07/2013  Procedure: left midfoot amputation;  Surgeon: Marianna Payment, MD;  Location: [facility];  Service: Orthopedics;  Laterality: Left; . AMPUTATION Left 08/09/2013  Procedure: AMPUTATION BELOW KNEE;  Surgeon: Marianna Payment, MD;  [facility];  Service: Orthopedics;  Laterality: Left; . AV FISTULA PLACEMENT Left 12/03/2014  Procedure: ARTERIOVENOUS (AV) FISTULA CREATION;  Surgeon: Angelia Mould, MD;  Location: [facility];  Service: Vascular;  Laterality: Left; . AV FISTULA PLACEMENT Right 08/12/2019  Procedure: ARTERIOVENOUS (AV) FISTULA CREATION;  Surgeon: Marty Heck, MD;  Location: [facility];  Service: Vascular;  Laterality: Right; . CARDIAC CATHETERIZATION  03/31/2009  Proximal RCA thrombotic occlusion (inferior STEMI) other coronaries but in a codominant system . EYE SURGERY    "bleeding; vessels leaking" . [place]   "broke her leg in 2 places when she was young" . IR FLUORO GUIDE CV LINE RIGHT  02/12/2020 . IR US GUIDE VASC ACCESS RIGHT  02/12/2020 . LEG SURGERY    hole in bone( during Childhood) . LEG TENDON SURGERY Right 2000s  patient fell @ [facility] . PERCUTANEOUS CORONARY STENT INTERVENTION (PCI-S)  03/31/2009  PTCA , bare-metal stent 3.5 mm x 24 mm ostium of RCA ,EF 55% . TRANSTHORACIC ECHOCARDIOGRAM  06/2014; 12/2016  a. Technically difficult study. Normal LV size with mild LVH. EF 55-60%. Mod - GR 2 DD. Nl RV size & fxn. ~ Aortic Sclerosis w/ stenosis;; b. LVEF 65-70%, GR 1-2 DD. No RWMA, Mod LA Dilation HPI: Pt is a 69 y/o female admitted 02/09/20 secondary to RLE cellulitis and volume overload. PMH includes CAD s/p stent, DM, CKD, CVA, dCHF, L BKA. Pt had a previous swallow evaluation in 2014 with functional appearing swallow until challenged with large, consecutive boluses. Regular solids and thin liquids via small, single sips recommended at that time. MRI 4/1: Small acute to subacute infarction of the left corona radiata.  Subjective: pt drowsy, initially declines all POs offered but does take a small amount when given encouragement Assessment / Plan / Recommendation CHL IP CLINICAL IMPRESSIONS 02/19/2020 Clinical Impression Pt presents with mild oropharyngeal dysphagia characterized by impaired A-P transport, reduced lingual retraction, impaired bolus cohesion, and a pharyngeal delay. She demonstrated premature spillage to the level of the valleculae, vallecular residue, and occasional transient penetration (PAS 2) with thin liquids. A dyspahgia 2 diet with thin liquids is recommended at this time and SLP will follow pt to ensure diet tolerance.  SLP Visit Diagnosis Dysphagia, oropharyngeal phase (R13.12) Attention and concentration deficit following -- Frontal lobe and executive function deficit following -- Impact on safety and function Moderate aspiration risk;Risk for inadequate nutrition/hydration   CHL IP TREATMENT  RECOMMENDATION 02/19/2020 Treatment Recommendations Therapy as outlined in treatment plan below   Prognosis 02/19/2020 Prognosis for Safe Diet Advancement Fair Barriers to Reach Goals Cognitive deficits Barriers/Prognosis Comment -- CHL IP  DIET RECOMMENDATION 02/19/2020 SLP Diet Recommendations Dysphagia 2 (Fine chop) solids;Thin liquid Liquid Administration via Cup;Straw Medication Administration Whole meds with puree Compensations Slow rate;Small sips/bites;Follow solids with liquid Postural Changes Seated upright at 90 degrees;Remain semi-upright after after feeds/meals (Comment)   CHL IP OTHER RECOMMENDATIONS 02/19/2020 Recommended Consults -- Oral Care Recommendations Oral care BID Other Recommendations Have oral suction available   CHL IP FOLLOW UP RECOMMENDATIONS 02/19/2020 Follow up Recommendations Skilled Nursing facility   [facility] IP FREQUENCY AND DURATION 02/19/2020 Speech Therapy Frequency (ACUTE ONLY) min 2x/week Treatment Duration 2 weeks      CHL IP ORAL PHASE 02/19/2020 Oral Phase Impaired Oral - Pudding Teaspoon -- Oral - Pudding Cup -- Oral - Honey Teaspoon -- Oral - Honey Cup -- Oral - Nectar Teaspoon -- Oral - Nectar Cup -- Oral - Nectar Straw -- Oral - Thin Teaspoon -- Oral - Thin Cup -- Oral - Thin Straw Premature spillage Oral - Puree Reduced posterior propulsion Oral - Mech Soft Impaired mastication Oral - Regular -- Oral - Multi-Consistency -- Oral - Pill Reduced posterior propulsion Oral Phase - Comment --  CHL IP PHARYNGEAL PHASE 02/19/2020 Pharyngeal Phase Impaired Pharyngeal- Pudding Teaspoon -- Pharyngeal -- Pharyngeal- Pudding Cup -- Pharyngeal -- Pharyngeal- Honey Teaspoon -- Pharyngeal -- Pharyngeal- Honey Cup -- Pharyngeal -- Pharyngeal- Nectar Teaspoon -- Pharyngeal -- Pharyngeal- Nectar Cup -- Pharyngeal -- Pharyngeal- Nectar Straw -- Pharyngeal -- Pharyngeal- Thin Teaspoon -- Pharyngeal -- Pharyngeal- Thin Cup -- Pharyngeal -- Pharyngeal- Thin Straw Penetration/Aspiration during swallow;Reduced  tongue base retraction Pharyngeal Material enters airway, remains ABOVE vocal cords then ejected out Pharyngeal- Puree Reduced tongue base retraction;Pharyngeal residue - valleculae Pharyngeal -- Pharyngeal- Mechanical Soft Reduced tongue base retraction;Pharyngeal residue - valleculae Pharyngeal -- Pharyngeal- Regular -- Pharyngeal -- Pharyngeal- Multi-consistency -- Pharyngeal -- Pharyngeal- Pill -- Pharyngeal -- Pharyngeal Comment --  CHL IP CERVICAL ESOPHAGEAL PHASE 02/19/2020 Cervical Esophageal Phase WFL Pudding Teaspoon -- Pudding Cup -- Honey Teaspoon -- Honey Cup -- Nectar Teaspoon -- Nectar Cup -- Nectar Straw -- Thin Teaspoon -- Thin Cup -- Thin Straw -- Puree -- Mechanical Soft -- Regular -- Multi-consistency -- Pill -- Cervical Esophageal Comment -- Shanika I. Hardin Negus, [place], [place] Office number [phone] Pager [place] 02/19/2020, 2:17 PM              DG Fluoro Guide CV Line-No Report  Result Date: 02/20/2020 Fluoroscopy was utilized by the requesting physician.  No radiographic interpretation.        Scheduled Meds: . sodium chloride   Intravenous Once  . allopurinol  50 mg Oral Daily  . antiseptic oral rinse  15 mL Mouth Rinse BID  . atorvastatin  80 mg Oral q1800  . buPROPion  100 mg Oral TID  . carvedilol  25 mg Oral Q12H  . Chlorhexidine Gluconate Cloth  6 each Topical Daily  . clopidogrel  75 mg Oral Daily  . darbepoetin (ARANESP) injection - NON-DIALYSIS  60 mcg Subcutaneous Q Thu-1800  . feeding supplement (NEPRO CARB STEADY)  237 mL Oral BID BM  . folic acid  1 mg Oral Daily  . insulin aspart  0-6 Units Subcutaneous TID WC  . lactulose  20 g Oral BID  . mirtazapine  7.5 mg Oral QHS  . multivitamin  1 tablet Oral QHS  . omega-3 acid ethyl esters  1 g Oral BID  . sodium chloride flush  3 mL Intravenous Q12H   Continuous Infusions: . sodium chloride Stopped (  02/10/20 1409)  . ferumoxytol       LOS: 11 days    Time spent= 35 mins    Nakeem Murnane Arsenio Loader, MD [facility]  If 7PM-7AM, please contact night-coverage  02/21/2020, 11:50 AM   PROGRESS NOTE    [name]  ZOX:[id number] DOB: [birth year] DOA: 02/09/2020 PCP: Katherina Mires, MD   Brief Narrative:  69 year old female with history of DM-2, CAD s/p BMS to RCA, CVA, thrombocytopenia, diastolic CHF, CKD-5 with LUE aVF placed in 07/2019, PAD s/p left BKA presenting with progressive redness and edema of right lower extremity, and admitted for right lower extremity cellulitis, fluid overload and acute on chronic diastolic CHF.  Patient was started on IV vancomycin for cellulitis.    Developed AKI therefore nephrology team consulted initially started on Lasix and gentle hydration.  Renal function failed to improve therefore started on hemodialysis on 3/27.  Right IJ tunnel catheter placed 02/20/2020.   Assessment & Plan:   Principal Problem:   Acute on chronic diastolic CHF (congestive heart failure) (HCC) Active Problems:   Acute renal failure superimposed on stage 4 chronic kidney disease (HCC)   Type 2 diabetes mellitus with renal manifestations (HCC)   CAD S/P PCI TO RCA for Inferior STEMI. BMS   CKD (chronic kidney disease), stage IV (HCC)   Anemia in chronic kidney disease   CVA (cerebral vascular accident) ([place])   AKI (acute kidney injury) ([place])   Cellulitis of right leg   Thrombocytopenia (HCC)   Hyperbilirubinemia   Xerostomia   Advanced care planning/counseling discussion   Full code status   Dry skin   Physical debility   Palliative care by specialist   Goals of care, counseling/discussion   Acute CVA (cerebrovascular accident) ([place])  Acute kidney injury on CKD stage V Anemia of chronic disease -Getting dialysis via temporary dialysis catheter.  Nephrology social worker spoke with the family regarding outpatient arrangements, until she gets her strength back it will be difficult to get this done. -Right IJ  tunneled catheter placed 02/20/2020 -Hemodialysis per nephrology team -BUN and creatinine improving. -Status post 1 unit PRBC 4/1.  Acute on chronic diastolic congestive heart failure, ejection fraction 65% -Volume status is stable for now.  Stable with dialysis  Right lower extremity cellulitis, resolved -Improved, completed 7 days of doxycycline, ended 8/11  Acute metabolic encephalopathy Acute/subacute CVA, corona radiata -Combination of uremia/hyperammonia.  No focal neuro deficits.  CT of the head is negative.  TSH, B12 are normal -Lactulose twice daily -Continue supportive care, avoid sedative medication. -Continue Plavix and statin -Spoke with Dr Cheral Marker on 4/1 who reviewed patient's MRI.  No further input at this time.  Continue current management. -Hemoglobin A1c 6.9, LDL 12  Diabetes mellitus type 2 controlled -Hemoglobin A1c 6.0.  Continue current regimen.  Normocytic anemia Mild folate deficiency -Continue folic acid, insulin sliding scale and Accu-Cheks  History of peripheral arterial disease status post left-sided BKA -Continue Plavix, Lipitor and Zetia  Coronary artery disease status post bare-metal stent to RCA in 2010 -Continue home cardiac meds  Lack of appetite/poor oral intake Mild protein calorie malnutrition -Continue mirtazapine.  Encourage p.o. intake.  Dietitian/speech and swallow working with her.MBS planned today  Goals of care discussion-palliative care team following    DVT prophylaxis: None secondary to thrombocytopenia Code Status: Full code Family Communication: None today Disposition Plan:   Patient From= home  Patient Anticipated D/C place= once cleared by nephrology, her main issue is being strong enough to get seat  for outpatient hemodialysis.  Complicated disposition, family aware.  Subjective: No complaints this morning.  She is resting but easily arousable.  Review of Systems Otherwise negative except as per HPI,  including: General = no fevers, chills, dizziness,  fatigue HEENT/EYES = negative for loss of vision, double vision, blurred vision,  sore throa Cardiovascular= negative for chest pain, palpitation Respiratory/lungs= negative for shortness of breath, cough, wheezing; hemoptysis,  Gastrointestinal= negative for nausea, vomiting, abdominal pain Genitourinary= negative for Dysuria MSK = Negative for arthralgia, myalgias Neurology= Negative for headache, numbness, tingling  Psychiatry= Negative for suicidal and homocidal ideation Skin= Negative for Rash   Examination:  Constitutional: Generally weak and chronically ill-appearing.  Not in acute distress at this time. Respiratory: Clear to auscultation bilaterally Cardiovascular: Normal sinus rhythm, no rubs Abdomen: Nontender nondistended good bowel sounds Musculoskeletal: No edema noted Skin: No rashes seen Neurologic: CN 2-12 grossly intact.  And nonfocal Psychiatric: Normal judgment and insight. Alert and oriented x 3. Normal mood.    Right IJ tunnel catheter noted  Objective: Vitals:   02/21/20 0202 02/21/20 0236 02/21/20 0300 02/21/20 0901  BP: (!) 120/55 (!) 131/50  (!) 137/52  Pulse:  74  88  Resp:  17 18 16   Temp: 98.1 F (36.7 C) (!) 97.5 F (36.4 C)  99.5 F (37.5 C)  TempSrc: Oral Oral  Oral  SpO2: 100% 100%  94%  Weight: 113.5 kg     Height:        Intake/Output Summary (Last 24 hours) at 02/21/2020 1151 Last data filed at 02/21/2020 0900 Gross per 24 hour  Intake 940 ml  Output 3000 ml  Net -2060 ml   Filed Weights   02/20/20 0148 02/20/20 2250 02/21/20 0202  Weight: 115.2 kg 115.5 kg 113.5 kg     Data Reviewed:   CBC: Recent Labs  Lab 02/16/20 1230 02/16/20 1230 02/17/20 0443 02/17/20 1410 02/18/20 0511 02/18/20 2352 02/19/20 0410 02/20/20 0500  WBC 11.3*  --  11.5*  --  11.2*  --  10.5 9.3  HGB 7.5*   < > 7.3*  --  7.3* 8.0* 8.0* 7.7*  HCT 25.1*   < > 24.4* 23.6* 24.8* 25.8* 26.3* 25.9*   MCV 103.3*  --  102.5*  --  104.2*  --  100.4* 100.4*  PLT 93*  --  84*  --  90*  --  79* 81*   < > = values in this interval not displayed.   Basic Metabolic Panel: Recent Labs  Lab 02/15/20 0554 02/15/20 1357 02/16/20 0506 02/17/20 0443 02/18/20 0511 02/19/20 0410 02/20/20 0500  NA  --  141  --  135 142 138 139  K  --  3.8  --  3.6 4.0 3.4* 3.6  CL  --  103  --  100 103 100 99  CO2  --  23  --  26 25 28 27   GLUCOSE  --  143*  --  170* 156* 151* 215*  BUN  --  61*  --  26* 39* 16 33*  CREATININE  --  3.76*  --  2.68* 4.19* 2.46* 4.03*  CALCIUM  --  8.5*  --  8.2* 8.7* 8.4* 8.4*  MG 1.9  --  1.7  --  1.8 1.6* 1.7  PHOS  --  2.7  --   --   --   --   --    GFR: Estimated Creatinine Clearance: 15.9 mL/min (A) (by C-G formula based on SCr of 4.03  mg/dL (H)). Liver Function Tests: Recent Labs  Lab 02/15/20 1357 02/17/20 0443 02/18/20 0511 02/19/20 0410  AST  --  14* 12* 11*  ALT  --  19 18 14   ALKPHOS  --  109 95 99  BILITOT  --  1.6* 1.2 1.3*  PROT  --  5.4* 5.4* 5.7*  ALBUMIN 2.2* 2.0* 1.9* 2.0*   No results for input(s): LIPASE, AMYLASE in the last 168 hours. Recent Labs  Lab 02/17/20 0443  AMMONIA 19   Coagulation Profile: No results for input(s): INR, PROTIME in the last 168 hours. Cardiac Enzymes: No results for input(s): CKTOTAL, CKMB, CKMBINDEX, TROPONINI in the last 168 hours. BNP (last 3 results) No results for input(s): PROBNP in the last 8760 hours. HbA1C: No results for input(s): HGBA1C in the last 72 hours. CBG: Recent Labs  Lab 02/20/20 1059 02/20/20 1616 02/20/20 2126 02/21/20 0626 02/21/20 1121  GLUCAP 177* 215* 280* 198* 257*   Lipid Profile: Recent Labs    02/18/20 1425  CHOL 54  HDL 20*  LDLCALC 12  TRIG 112  CHOLHDL 2.7   Thyroid Function Tests: No results for input(s): TSH, T4TOTAL, FREET4, T3FREE, THYROIDAB in the last 72 hours. Anemia Panel: No results for input(s): VITAMINB12, FOLATE, FERRITIN, TIBC, IRON, RETICCTPCT  in the last 72 hours. Sepsis Labs: No results for input(s): PROCALCITON, LATICACIDVEN in the last 168 hours.  No results found for this or any previous visit (from the past 240 hour(s)).       Radiology Studies: DG CHEST PORT 1 VIEW  Result Date: 02/20/2020 CLINICAL DATA:  Dialysis catheter placement EXAM: PORTABLE CHEST 1 VIEW COMPARISON:  February 09, 2020 FINDINGS: Central catheter tip is in the right atrium. No pneumothorax. There is cardiomegaly with pulmonary venous hypertension. There is interstitial edema with small right pleural effusion. No consolidation. No adenopathy evident. There is aortic atherosclerosis. No bone lesions. IMPRESSION: Central catheter tip in right atrium. No pneumothorax. There is cardiomegaly with pulmonary vascular congestion. Small right pleural effusion with interstitial edema suggests underlying degree of congestive heart failure. Aortic Atherosclerosis (ICD10-I70.0). Electronically Signed   By: Lowella Grip III M.D.   On: 02/20/2020 11:01   DG Swallowing Func-Speech Pathology  Result Date: 02/19/2020 Objective Swallowing Evaluation: Type of Study: MBS-Modified Barium Swallow Study  Patient Details Name: Charissa Knowles MRN: [id number] Date of Birth: 1951/09/16 Today's Date: 02/19/2020 Time: SLP Start Time (ACUTE ONLY): 0932 -SLP Stop Time (ACUTE ONLY): 1255 SLP Time Calculation (min) (ACUTE ONLY): 10 min Past Medical History: Past Medical History: Diagnosis Date . Anemia  . Arthritis   "right shoulder" (12/19/2016) . CAD S/P percutaneous coronary angioplasty 5/110   status post PCI to the RCA for inferior STEMI . Cervical cancer ([place])  . Chronic diastolic heart failure, NYHA class 2 ([place])   Echocardiogram 06/2014:  Technically difficult study. Normal LV size with mild LVH. EF 55-60%. Moderate diastolic dysfunction, normal RV size and function. Likely aortic sclerosis without stenosis . Chronic lower back pain  . CKD (chronic kidney disease), stage IV ([place])   Recent acute  on chronic exacerbation in July 2014 . Depression  . Diabetes mellitus, type II, insulin dependent ([place])   With complications - CAD, CVA, peripheral ulcer , . Diabetic foot ulcer ([place])  . Diabetic peripheral neuropathy associated with type 2 diabetes mellitus ([place])  . Dry gangrene (HCC)   Left second toe; now on Sole of L foot --  s/p L BKA . History of blood transfusion   "w/hysterectomy"  .  Hyperlipidemia LDL goal <70  . Hypertension associated with diabetes ([place])  . Obesity, Class III, BMI 40-49.9 (morbid obesity) (HCC)   BMI 43; 5' 2',  235 pounds 6.4 ounces . On home oxygen therapy   "2.5L prn; sometimes as little as once/month" (12/19/2016) . PAD (peripheral artery disease) ([place])    -- Most recent Dopplers April 2016 Status post left BKA. Known right PTA occlusion . Pneumonia   "childhood" . ST elevation myocardial infarction (STEMI) of inferior wall ([place])  03/2009  Ostial/proximal RCA occlusion treated with BMS stent . Stroke/cerebrovascular accident ([place]) 03/18/2013; 12/19/2016  a. Right-sided weakness, mostly with balance issues and only mild weakness;; b. Slurring speach -- L Posterior Putamen CVA Past Surgical History: Past Surgical History: Procedure Laterality Date . ABDOMINAL HYSTERECTOMY   . AMPUTATION Left 08/07/2013  Procedure: left midfoot amputation;  Surgeon: Marianna Payment, MD;  [facility];  Service: Orthopedics;  Laterality: Left; . AMPUTATION Left 08/09/2013  Procedure: AMPUTATION BELOW KNEE;  Surgeon: Marianna Payment, MD;  [facility];  Service: Orthopedics;  Laterality: Left; . AV FISTULA PLACEMENT Left 12/03/2014  Procedure: ARTERIOVENOUS (AV) FISTULA CREATION;  Surgeon: Angelia Mould, MD;  Location: [facility];  Service: Vascular;  Laterality: Left; . AV FISTULA PLACEMENT Right 08/12/2019  Procedure: ARTERIOVENOUS (AV) FISTULA CREATION;  Surgeon: Marty Heck, MD;  Location: [facility];  Service: Vascular;  Laterality: Right; . CARDIAC CATHETERIZATION  03/31/2009  Proximal  RCA thrombotic occlusion (inferior STEMI) other coronaries but in a codominant system . EYE SURGERY    "bleeding; vessels leaking" . [place]  "broke her leg in 2 places when she was young" . IR FLUORO GUIDE CV LINE RIGHT  02/12/2020 . IR US GUIDE VASC ACCESS RIGHT  02/12/2020 . LEG SURGERY    hole in bone( during Childhood) . LEG TENDON SURGERY Right 2000s  patient fell @ [facility] . PERCUTANEOUS CORONARY STENT INTERVENTION (PCI-S)  03/31/2009  PTCA , bare-metal stent 3.5 mm x 24 mm ostium of RCA ,EF 55% . TRANSTHORACIC ECHOCARDIOGRAM  06/2014; 12/2016  a. Technically difficult study. Normal LV size with mild LVH. EF 55-60%. Mod - GR 2 DD. Nl RV size & fxn. ~ Aortic Sclerosis w/ stenosis;; b. LVEF 65-70%, GR 1-2 DD. No RWMA, Mod LA Dilation HPI: Pt is a 69 y/o female admitted 02/09/20 secondary to RLE cellulitis and volume overload. PMH includes CAD s/p stent, DM, CKD, CVA, dCHF, L BKA. Pt had a previous swallow evaluation in 2014 with functional appearing swallow until challenged with large, consecutive boluses. Regular solids and thin liquids via small, single sips recommended at that time. MRI 4/1: Small acute to subacute infarction of the left corona radiata.  Subjective: pt drowsy, initially declines all POs offered but does take a small amount when given encouragement Assessment / Plan / Recommendation CHL IP CLINICAL IMPRESSIONS 02/19/2020 Clinical Impression Pt presents with mild oropharyngeal dysphagia characterized by impaired A-P transport, reduced lingual retraction, impaired bolus cohesion, and a pharyngeal delay. She demonstrated premature spillage to the level of the valleculae, vallecular residue, and occasional transient penetration (PAS 2) with thin liquids. A dyspahgia 2 diet with thin liquids is recommended at this time and SLP will follow pt to ensure diet tolerance.  SLP Visit Diagnosis Dysphagia, oropharyngeal phase (R13.12) Attention and concentration deficit following -- Frontal lobe and  executive function deficit following -- Impact on safety and function Moderate aspiration risk;Risk for inadequate nutrition/hydration   CHL IP TREATMENT RECOMMENDATION 02/19/2020 Treatment Recommendations  Therapy as outlined in treatment plan below   Prognosis 02/19/2020 Prognosis for Safe Diet Advancement Fair Barriers to Reach Goals Cognitive deficits Barriers/Prognosis Comment -- CHL IP DIET RECOMMENDATION 02/19/2020 SLP Diet Recommendations Dysphagia 2 (Fine chop) solids;Thin liquid Liquid Administration via Cup;Straw Medication Administration Whole meds with puree Compensations Slow rate;Small sips/bites;Follow solids with liquid Postural Changes Seated upright at 90 degrees;Remain semi-upright after after feeds/meals (Comment)   CHL IP OTHER RECOMMENDATIONS 02/19/2020 Recommended Consults -- Oral Care Recommendations Oral care BID Other Recommendations Have oral suction available   CHL IP FOLLOW UP RECOMMENDATIONS 02/19/2020 Follow up Recommendations Skilled Nursing facility   [facility] IP FREQUENCY AND DURATION 02/19/2020 Speech Therapy Frequency (ACUTE ONLY) min 2x/week Treatment Duration 2 weeks      CHL IP ORAL PHASE 02/19/2020 Oral Phase Impaired Oral - Pudding Teaspoon -- Oral - Pudding Cup -- Oral - Honey Teaspoon -- Oral - Honey Cup -- Oral - Nectar Teaspoon -- Oral - Nectar Cup -- Oral - Nectar Straw -- Oral - Thin Teaspoon -- Oral - Thin Cup -- Oral - Thin Straw Premature spillage Oral - Puree Reduced posterior propulsion Oral - Mech Soft Impaired mastication Oral - Regular -- Oral - Multi-Consistency -- Oral - Pill Reduced posterior propulsion Oral Phase - Comment --  CHL IP PHARYNGEAL PHASE 02/19/2020 Pharyngeal Phase Impaired Pharyngeal- Pudding Teaspoon -- Pharyngeal -- Pharyngeal- Pudding Cup -- Pharyngeal -- Pharyngeal- Honey Teaspoon -- Pharyngeal -- Pharyngeal- Honey Cup -- Pharyngeal -- Pharyngeal- Nectar Teaspoon -- Pharyngeal -- Pharyngeal- Nectar Cup -- Pharyngeal -- Pharyngeal- Nectar Straw -- Pharyngeal --  Pharyngeal- Thin Teaspoon -- Pharyngeal -- Pharyngeal- Thin Cup -- Pharyngeal -- Pharyngeal- Thin Straw Penetration/Aspiration during swallow;Reduced tongue base retraction Pharyngeal Material enters airway, remains ABOVE vocal cords then ejected out Pharyngeal- Puree Reduced tongue base retraction;Pharyngeal residue - valleculae Pharyngeal -- Pharyngeal- Mechanical Soft Reduced tongue base retraction;Pharyngeal residue - valleculae Pharyngeal -- Pharyngeal- Regular -- Pharyngeal -- Pharyngeal- Multi-consistency -- Pharyngeal -- Pharyngeal- Pill -- Pharyngeal -- Pharyngeal Comment --  CHL IP CERVICAL ESOPHAGEAL PHASE 02/19/2020 Cervical Esophageal Phase WFL Pudding Teaspoon -- Pudding Cup -- Honey Teaspoon -- Honey Cup -- Nectar Teaspoon -- Nectar Cup -- Nectar Straw -- Thin Teaspoon -- Thin Cup -- Thin Straw -- Puree -- Mechanical Soft -- Regular -- Multi-consistency -- Pill -- Cervical Esophageal Comment -- Shanika I. Hardin Negus, [place], [place] Office number [phone] Pager [place] 02/19/2020, 2:17 PM              DG Fluoro Guide CV Line-No Report  Result Date: 02/20/2020 Fluoroscopy was utilized by the requesting physician.  No radiographic interpretation.        Scheduled Meds: . sodium chloride   Intravenous Once  . allopurinol  50 mg Oral Daily  . antiseptic oral rinse  15 mL Mouth Rinse BID  . atorvastatin  80 mg Oral q1800  . buPROPion  100 mg Oral TID  . carvedilol  25 mg Oral Q12H  . Chlorhexidine Gluconate Cloth  6 each Topical Daily  . clopidogrel  75 mg Oral Daily  . darbepoetin (ARANESP) injection - NON-DIALYSIS  60 mcg Subcutaneous Q Thu-1800  . feeding supplement (NEPRO CARB STEADY)  237 mL Oral BID BM  . folic acid  1 mg Oral Daily  . insulin aspart  0-6 Units Subcutaneous TID WC  . lactulose  20 g Oral BID  . mirtazapine  7.5 mg Oral QHS  . multivitamin  1 tablet Oral QHS  .  omega-3 acid ethyl esters  1 g Oral BID  . sodium  chloride flush  3 mL Intravenous Q12H   Continuous Infusions: . sodium chloride Stopped (02/10/20 1409)  . ferumoxytol       LOS: 11 days   Time spent= 35 mins    Alonah Lineback Arsenio Loader, MD [facility]  If 7PM-7AM, please contact night-coverage  02/21/2020, 11:51 AM

## 2020-02-21 NOTE — Progress Notes (Signed)
[facility] ROUNDING NOTE   Subjective:   This is 69 year old lady with history of diabetes coronary artery disease CVA thrombocytopenia diastolic heart failure CKD stage V, now end-stage renal disease on dialysis.  History of left upper arm extremity AV fistula placed 07/2019, PAD status post left BKA fluid overload and right lower extremity cellulitis admitted 02/09/2020.  Thought to have uremic encephalopathy contributing to lethargy.  Was initiated on hemodialysis.   Appreciate assistance from palliative medicine.   Appreciate assistance from Dr.Early with placement of tunneled dialysis catheter 02/19/2020.  Underwent dialysis 02/21/2020 with removal of 3 L.  Patient is still sluggish but does follow commands  Blood pressure 131/50 pulse 74 temperature 97.5 O2 sats 100% 2 L nasal cannula  Sodium 139 potassium 3.6 chloride 99 CO2 27 BUN 33 creatinine 4 glucose 215 calcium 8.4 hemoglobin 7.7 platelets 81  Allopurinol 50 mg daily atorvastatin 80 mg daily Wellbutrin 100 mg 3 times daily Coreg 25 mg every 12 hours Plavix 75 mg daily darbepoetin 60 mcg every Thursday doxycycline 100 mg every 12 hours, insulin sliding scale, Remeron 7.5 mg nightly  Objective:  Vital signs in last 24 hours:  Temp:  [97.5 F (36.4 C)-99.1 F (37.3 C)] 97.5 F (36.4 C) (04/04 0236) Pulse Rate:  [58-86] 74 (04/04 0236) Resp:  [15-21] 18 (04/04 0300) BP: (87-140)/(37-60) 131/50 (04/04 0236) SpO2:  [94 %-100 %] 100 % (04/04 0236) Weight:  [113.5 kg-115.5 kg] 113.5 kg (04/04 0202)  Weight change: 0.286 kg Filed Weights   02/20/20 0148 02/20/20 2250 02/21/20 0202  Weight: 115.2 kg 115.5 kg 113.5 kg    Intake/Output: I/O last 3 completed shifts: In: 1480 [P.O.:1180; I.V.:300] Out: 3000 [Other:3000]   Intake/Output this shift:  No intake/output data recorded.  Gen: Awake and comfortable nondistressed nonverbal CVS: Pulse regular rhythm, normal rate, S1 and S2 normal.  Right IJ hemodialysis  catheter Resp: Poor inspiratory effort with decreased breath sounds over bases, no rales Abd: Soft, flat, nontender Ext: Right leg in Ace wrap, trace edema left ankle.  Short pulsatile right upper arm AVF   Basic Metabolic Panel: Recent Labs  Lab 02/15/20 0554 02/15/20 1357 02/15/20 1357 02/16/20 0506 02/17/20 0443 02/17/20 0443 02/18/20 0511 02/19/20 0410 02/20/20 0500  NA  --  141  --   --  135  --  142 138 139  K  --  3.8  --   --  3.6  --  4.0 3.4* 3.6  CL  --  103  --   --  100  --  103 100 99  CO2  --  23  --   --  26  --  25 28 27   GLUCOSE  --  143*  --   --  170*  --  156* 151* 215*  BUN  --  61*  --   --  26*  --  39* 16 33*  CREATININE  --  3.76*  --   --  2.68*  --  4.19* 2.46* 4.03*  CALCIUM  --  8.5*   < >  --  8.2*   < > 8.7* 8.4* 8.4*  MG 1.9  --   --  1.7  --   --  1.8 1.6* 1.7  PHOS  --  2.7  --   --   --   --   --   --   --    < > = values in this interval not displayed.    Liver Function  Tests: Recent Labs  Lab 02/15/20 1357 02/17/20 0443 02/18/20 0511 02/19/20 0410  AST  --  14* 12* 11*  ALT  --  19 18 14   ALKPHOS  --  109 95 99  BILITOT  --  1.6* 1.2 1.3*  PROT  --  5.4* 5.4* 5.7*  ALBUMIN 2.2* 2.0* 1.9* 2.0*   No results for input(s): LIPASE, AMYLASE in the last 168 hours. Recent Labs  Lab 02/17/20 0443  AMMONIA 19    CBC: Recent Labs  Lab 02/16/20 1230 02/16/20 1230 02/17/20 0443 02/17/20 1410 02/18/20 0511 02/18/20 2352 02/19/20 0410 02/20/20 0500  WBC 11.3*  --  11.5*  --  11.2*  --  10.5 9.3  HGB 7.5*   < > 7.3*  --  7.3* 8.0* 8.0* 7.7*  HCT 25.1*   < > 24.4* 23.6* 24.8* 25.8* 26.3* 25.9*  MCV 103.3*  --  102.5*  --  104.2*  --  100.4* 100.4*  PLT 93*  --  84*  --  90*  --  79* 81*   < > = values in this interval not displayed.    Cardiac Enzymes: No results for input(s): CKTOTAL, CKMB, CKMBINDEX, TROPONINI in the last 168 hours.  BNP: Invalid input(s): POCBNP  CBG: Recent Labs  Lab 02/20/20 0825 02/20/20 1059  02/20/20 1616 02/20/20 2126 02/21/20 0626  GLUCAP 191* 177* 215* 280* 198*    Microbiology: Results for orders placed or performed during the hospital encounter of 02/09/20  Blood culture (routine x 2)     Status: None   Collection Time: 02/09/20  1:25 AM   Specimen: BLOOD LEFT ARM  Result Value Ref Range Status   Specimen Description BLOOD LEFT ARM  Final   Special Requests   Final    BOTTLES DRAWN AEROBIC AND ANAEROBIC Blood Culture adequate volume   Culture   Final    NO GROWTH 5 DAYS Performed at [facility], [place] [address], [place], [place] [address]    Report Status 02/14/2020 FINAL  Final  Respiratory Panel by RT PCR (Flu A&B, Covid) - Nasopharyngeal Swab     Status: None   Collection Time: 02/09/20  3:08 AM   Specimen: Nasopharyngeal Swab  Result Value Ref Range Status   SARS Coronavirus 2 by RT PCR NEGATIVE NEGATIVE Final    Comment: (NOTE) SARS-CoV-2 target nucleic acids are NOT DETECTED. The SARS-CoV-2 RNA is generally detectable in upper respiratoy specimens during the acute phase of infection. The lowest concentration of SARS-CoV-2 viral copies this assay can detect is 131 copies/mL. A negative result does not preclude SARS-Cov-2 infection and should not be used as the sole basis for treatment or other patient management decisions. A negative result may occur with  improper specimen collection/handling, submission of specimen other than nasopharyngeal swab, presence of viral mutation(s) within the areas targeted by this assay, and inadequate number of viral copies (<131 copies/mL). A negative result must be combined with clinical observations, patient history, and epidemiological information. The expected result is Negative. Fact Sheet for Patients:  PinkCheek.be Fact Sheet for Healthcare Providers:  GravelBags.it This test is not yet ap proved or cleared by the [place] FDA and  has been  authorized for detection and/or diagnosis of SARS-CoV-2 by FDA under an Emergency Use Authorization (EUA). This EUA will remain  in effect (meaning this test can be used) for the duration of the COVID-19 declaration under Section 564(b)(1) of the Act, 21 U.S.C. section 360bbb-3(b)(1), unless the authorization is  terminated or revoked sooner.    Influenza A by PCR NEGATIVE NEGATIVE Final   Influenza B by PCR NEGATIVE NEGATIVE Final    Comment: (NOTE) The Xpert Xpress SARS-CoV-2/FLU/RSV assay is intended as an aid in  the diagnosis of influenza from Nasopharyngeal swab specimens and  should not be used as a sole basis for treatment. Nasal washings and  aspirates are unacceptable for Xpert Xpress SARS-CoV-2/FLU/RSV  testing. Fact Sheet for Patients: PinkCheek.be Fact Sheet for Healthcare Providers: GravelBags.it This test is not yet approved or cleared by the [place] FDA and  has been authorized for detection and/or diagnosis of SARS-CoV-2 by  FDA under an Emergency Use Authorization (EUA). This EUA will remain  in effect (meaning this test can be used) for the duration of the  Covid-19 declaration under Section 564(b)(1) of the Act, 21  U.S.C. section 360bbb-3(b)(1), unless the authorization is  terminated or revoked. Performed at [facility], [place] [address], [place], [place] [address]   Blood culture (routine x 2)     Status: None   Collection Time: 02/09/20  3:15 AM   Specimen: BLOOD LEFT HAND  Result Value Ref Range Status   Specimen Description BLOOD LEFT HAND  Final   Special Requests   Final    BOTTLES DRAWN AEROBIC ONLY Blood Culture results may not be optimal due to an inadequate volume of blood received in culture bottles   Culture   Final    NO GROWTH 5 DAYS Performed at [facility], [place] [address], [place], [place] [address]    Report Status 02/14/2020 FINAL  Final    Coagulation  Studies: No results for input(s): LABPROT, INR in the last 72 hours.  Urinalysis: No results for input(s): COLORURINE, LABSPEC, PHURINE, GLUCOSEU, HGBUR, BILIRUBINUR, KETONESUR, PROTEINUR, UROBILINOGEN, NITRITE, LEUKOCYTESUR in the last 72 hours.  Invalid input(s): APPERANCEUR    Imaging: DG CHEST PORT 1 VIEW  Result Date: 02/20/2020 CLINICAL DATA:  Dialysis catheter placement EXAM: PORTABLE CHEST 1 VIEW COMPARISON:  February 09, 2020 FINDINGS: Central catheter tip is in the right atrium. No pneumothorax. There is cardiomegaly with pulmonary venous hypertension. There is interstitial edema with small right pleural effusion. No consolidation. No adenopathy evident. There is aortic atherosclerosis. No bone lesions. IMPRESSION: Central catheter tip in right atrium. No pneumothorax. There is cardiomegaly with pulmonary vascular congestion. Small right pleural effusion with interstitial edema suggests underlying degree of congestive heart failure. Aortic Atherosclerosis (ICD10-I70.0). Electronically Signed   By: Lowella Grip III M.D.   On: 02/20/2020 11:01   DG Swallowing Func-Speech Pathology  Result Date: 02/19/2020 Objective Swallowing Evaluation: Type of Study: MBS-Modified Barium Swallow Study  Patient Details Name: Mikeria Valin MRN: [id number] Date of Birth: 30-Jun-1951 Today's Date: 02/19/2020 Time: SLP Start Time (ACUTE ONLY): 9622 -SLP Stop Time (ACUTE ONLY): 1255 SLP Time Calculation (min) (ACUTE ONLY): 10 min Past Medical History: Past Medical History: Diagnosis Date . Anemia  . Arthritis   "right shoulder" (12/19/2016) . CAD S/P percutaneous coronary angioplasty 5/110   status post PCI to the RCA for inferior STEMI . Cervical cancer ([place])  . Chronic diastolic heart failure, NYHA class 2 ([place])   Echocardiogram 06/2014:  Technically difficult study. Normal LV size with mild LVH. EF 55-60%. Moderate diastolic dysfunction, normal RV size and function. Likely aortic sclerosis without stenosis . Chronic  lower back pain  . CKD (chronic kidney disease), stage IV ([place])   Recent acute on chronic exacerbation in July 2014 . Depression  .  Diabetes mellitus, type II, insulin dependent ([place])   With complications - CAD, CVA, peripheral ulcer , . Diabetic foot ulcer ([place])  . Diabetic peripheral neuropathy associated with type 2 diabetes mellitus ([place])  . Dry gangrene ([facility])   Left second toe; now on Sole of L foot --  s/p L BKA . History of blood transfusion   "w/hysterectomy"  . Hyperlipidemia LDL goal <70  . Hypertension associated with diabetes ([place])  . Obesity, Class III, BMI 40-49.9 (morbid obesity) ([facility])   BMI 43; 5' 2',  235 pounds 6.4 ounces . On home oxygen therapy   "2.5L prn; sometimes as little as once/month" (12/19/2016) . PAD (peripheral artery disease) ([place])    -- Most recent Dopplers April 2016 Status post left BKA. Known right PTA occlusion . Pneumonia   "childhood" . ST elevation myocardial infarction (STEMI) of inferior wall ([place])  03/2009  Ostial/proximal RCA occlusion treated with BMS stent . Stroke/cerebrovascular accident ([place]) 03/18/2013; 12/19/2016  a. Right-sided weakness, mostly with balance issues and only mild weakness;; b. Slurring speach -- L Posterior Putamen CVA Past Surgical History: Past Surgical History: Procedure Laterality Date . ABDOMINAL HYSTERECTOMY   . AMPUTATION Left 08/07/2013  Procedure: left midfoot amputation;  Surgeon: Marianna Payment, MD;  Location: [facility];  Service: Orthopedics;  Laterality: Left; . AMPUTATION Left 08/09/2013  Procedure: AMPUTATION BELOW KNEE;  Surgeon: Marianna Payment, MD;  Location: [facility];  Service: Orthopedics;  Laterality: Left; . AV FISTULA PLACEMENT Left 12/03/2014  Procedure: ARTERIOVENOUS (AV) FISTULA CREATION;  Surgeon: Angelia Mould, MD;  Location: [facility];  Service: Vascular;  Laterality: Left; . AV FISTULA PLACEMENT Right 08/12/2019  Procedure: ARTERIOVENOUS (AV) FISTULA CREATION;  Surgeon: Marty Heck, MD;  Location: [facility];  Service:  Vascular;  Laterality: Right; . CARDIAC CATHETERIZATION  03/31/2009  Proximal RCA thrombotic occlusion (inferior STEMI) other coronaries but in a codominant system . EYE SURGERY    "bleeding; vessels leaking" . Wise  "broke her leg in 2 places when she was young" . IR FLUORO GUIDE CV LINE RIGHT  02/12/2020 . IR US GUIDE VASC ACCESS RIGHT  02/12/2020 . LEG SURGERY    hole in bone( during Childhood) . LEG TENDON SURGERY Right 2000s  patient fell @ [facility] . PERCUTANEOUS CORONARY STENT INTERVENTION (PCI-S)  03/31/2009  PTCA , bare-metal stent 3.5 mm x 24 mm ostium of RCA ,EF 55% . TRANSTHORACIC ECHOCARDIOGRAM  06/2014; 12/2016  a. Technically difficult study. Normal LV size with mild LVH. EF 55-60%. Mod - GR 2 DD. Nl RV size & fxn. ~ Aortic Sclerosis w/ stenosis;; b. LVEF 65-70%, GR 1-2 DD. No RWMA, Mod LA Dilation HPI: Pt is a 69 y/o female admitted 02/09/20 secondary to RLE cellulitis and volume overload. PMH includes CAD s/p stent, DM, CKD, CVA, dCHF, L BKA. Pt had a previous swallow evaluation in 2014 with functional appearing swallow until challenged with large, consecutive boluses. Regular solids and thin liquids via small, single sips recommended at that time. MRI 4/1: Small acute to subacute infarction of the left corona radiata.  Subjective: pt drowsy, initially declines all POs offered but does take a small amount when given encouragement Assessment / Plan / Recommendation CHL IP CLINICAL IMPRESSIONS 02/19/2020 Clinical Impression Pt presents with mild oropharyngeal dysphagia characterized by impaired A-P transport, reduced lingual retraction, impaired bolus cohesion, and a pharyngeal delay. She demonstrated premature spillage to the level of the valleculae, vallecular residue, and occasional transient penetration (PAS 2) with thin  liquids. A dyspahgia 2 diet with thin liquids is recommended at this time and SLP will follow pt to ensure diet tolerance.  SLP Visit Diagnosis Dysphagia, oropharyngeal  phase (R13.12) Attention and concentration deficit following -- Frontal lobe and executive function deficit following -- Impact on safety and function Moderate aspiration risk;Risk for inadequate nutrition/hydration   CHL IP TREATMENT RECOMMENDATION 02/19/2020 Treatment Recommendations Therapy as outlined in treatment plan below   Prognosis 02/19/2020 Prognosis for Safe Diet Advancement Fair Barriers to Reach Goals Cognitive deficits Barriers/Prognosis Comment -- CHL IP DIET RECOMMENDATION 02/19/2020 SLP Diet Recommendations Dysphagia 2 (Fine chop) solids;Thin liquid Liquid Administration via Cup;Straw Medication Administration Whole meds with puree Compensations Slow rate;Small sips/bites;Follow solids with liquid Postural Changes Seated upright at 90 degrees;Remain semi-upright after after feeds/meals (Comment)   CHL IP OTHER RECOMMENDATIONS 02/19/2020 Recommended Consults -- Oral Care Recommendations Oral care BID Other Recommendations Have oral suction available   CHL IP FOLLOW UP RECOMMENDATIONS 02/19/2020 Follow up Recommendations Skilled Nursing facility   [facility] IP FREQUENCY AND DURATION 02/19/2020 Speech Therapy Frequency (ACUTE ONLY) min 2x/week Treatment Duration 2 weeks      CHL IP ORAL PHASE 02/19/2020 Oral Phase Impaired Oral - Pudding Teaspoon -- Oral - Pudding Cup -- Oral - Honey Teaspoon -- Oral - Honey Cup -- Oral - Nectar Teaspoon -- Oral - Nectar Cup -- Oral - Nectar Straw -- Oral - Thin Teaspoon -- Oral - Thin Cup -- Oral - Thin Straw Premature spillage Oral - Puree Reduced posterior propulsion Oral - Mech Soft Impaired mastication Oral - Regular -- Oral - Multi-Consistency -- Oral - Pill Reduced posterior propulsion Oral Phase - Comment --  CHL IP PHARYNGEAL PHASE 02/19/2020 Pharyngeal Phase Impaired Pharyngeal- Pudding Teaspoon -- Pharyngeal -- Pharyngeal- Pudding Cup -- Pharyngeal -- Pharyngeal- Honey Teaspoon -- Pharyngeal -- Pharyngeal- Honey Cup -- Pharyngeal -- Pharyngeal- Nectar Teaspoon -- Pharyngeal --  Pharyngeal- Nectar Cup -- Pharyngeal -- Pharyngeal- Nectar Straw -- Pharyngeal -- Pharyngeal- Thin Teaspoon -- Pharyngeal -- Pharyngeal- Thin Cup -- Pharyngeal -- Pharyngeal- Thin Straw Penetration/Aspiration during swallow;Reduced tongue base retraction Pharyngeal Material enters airway, remains ABOVE vocal cords then ejected out Pharyngeal- Puree Reduced tongue base retraction;Pharyngeal residue - valleculae Pharyngeal -- Pharyngeal- Mechanical Soft Reduced tongue base retraction;Pharyngeal residue - valleculae Pharyngeal -- Pharyngeal- Regular -- Pharyngeal -- Pharyngeal- Multi-consistency -- Pharyngeal -- Pharyngeal- Pill -- Pharyngeal -- Pharyngeal Comment --  CHL IP CERVICAL ESOPHAGEAL PHASE 02/19/2020 Cervical Esophageal Phase WFL Pudding Teaspoon -- Pudding Cup -- Honey Teaspoon -- Honey Cup -- Nectar Teaspoon -- Nectar Cup -- Nectar Straw -- Thin Teaspoon -- Thin Cup -- Thin Straw -- Puree -- Mechanical Soft -- Regular -- Multi-consistency -- Pill -- Cervical Esophageal Comment -- Shanika I. Hardin Negus, [place], [place] Office number [phone] Pager [place] 02/19/2020, 2:17 PM              DG Fluoro Guide CV Line-No Report  Result Date: 02/20/2020 Fluoroscopy was utilized by the requesting physician.  No radiographic interpretation.     Medications:   . sodium chloride Stopped (02/10/20 1409)  . ferumoxytol     . sodium chloride   Intravenous Once  . allopurinol  50 mg Oral Daily  . antiseptic oral rinse  15 mL Mouth Rinse BID  . atorvastatin  80 mg Oral q1800  . buPROPion  100 mg Oral TID  . carvedilol  25 mg Oral Q12H  . Chlorhexidine Gluconate Cloth  6 each Topical Daily  .  clopidogrel  75 mg Oral Daily  . darbepoetin (ARANESP) injection - NON-DIALYSIS  60 mcg Subcutaneous Q Thu-1800  . feeding supplement (NEPRO CARB STEADY)  237 mL Oral BID BM  . folic acid  1 mg Oral Daily  . insulin aspart  0-6 Units Subcutaneous TID WC  . lactulose   20 g Oral BID  . mirtazapine  7.5 mg Oral QHS  . multivitamin  1 tablet Oral QHS  . omega-3 acid ethyl esters  1 g Oral BID  . sodium chloride flush  3 mL Intravenous Q12H   sodium chloride, acetaminophen **OR** acetaminophen, heparin, ondansetron **OR** ondansetron (ZOFRAN) IV, Resource ThickenUp Clear, sodium chloride flush  Assessment/ Plan:  1.  End-stage renal disease: Remains lethargic despite sequential dialysis.  She is now end-stage renal disease.  Started on dialysis via temporary IJ dialysis catheter placed by IR after RUA AVF deemed unusable;  .  She will need conversion to [facility] and placement of permanent access at some point.  Patient continues to be lethargic  .  Mar[place] Kitchen Recommend palliative medicine involvement.  Underwent TDC placement by vascular surgery appreciate assistance from Dr. Donnetta Hutching 02/19/2020.  Next dialysis will be planned for 02/23/2020 2.  Right lower extremity cellulitis: Clinically improving on oral doxycycline.  With [employer]. 3.  History of congestive heart failure with diastolic dysfunction: Currently appears to be compensated with dialysis started yesterday.  Leave off of diuretics and continue to monitor on dialysis. 4.  Anemia: Downtrending hemoglobin and hematocrit, with iron deficiency and on ESA.  Status post transfusion 02/18/2020 5.  Secondary hyperparathyroidism: Calcium and phosphorus levels currently at goal, poor oral intake and not on binders   LOS: [place] @TODAY @7 :47 AM

## 2020-02-21 NOTE — Progress Notes (Signed)
Patient daughter [name]) updated on patient condition. Answered daughter's questions.

## 2020-02-21 NOTE — Progress Notes (Signed)
Patient refused AM labs. Provider notified.

## 2020-02-21 NOTE — Plan of Care (Signed)
  Problem: Education: Goal: Ability to demonstrate management of disease process will improve Outcome: Progressing   Problem: Activity: Goal: Capacity to carry out activities will improve Outcome: Progressing   Problem: Cardiac: Goal: Ability to achieve and maintain adequate cardiopulmonary perfusion will improve Outcome: Progressing   Problem: Health Behavior/Discharge Planning: Goal: Ability to manage health-related needs will improve Outcome: Progressing

## 2020-02-22 DIAGNOSIS — L8990 Pressure ulcer of unspecified site, unspecified stage: Secondary | ICD-10-CM | POA: Insufficient documentation

## 2020-02-22 LAB — BASIC METABOLIC PANEL
Anion gap: 12 (ref 5–15)
BUN: 36 mg/dL — ABNORMAL HIGH (ref 8–23)
CO2: 25 mmol/L (ref 22–32)
Calcium: 8.8 mg/dL — ABNORMAL LOW (ref 8.9–10.3)
Chloride: 103 mmol/L (ref 98–111)
Creatinine, Ser: 4.09 mg/dL — ABNORMAL HIGH (ref 0.44–1.00)
GFR calc Af Amer: 12 mL/min — ABNORMAL LOW (ref >60)
GFR calc non Af Amer: 11 mL/min — ABNORMAL LOW (ref >60)
Glucose, Bld: 240 mg/dL — ABNORMAL HIGH (ref 70–99)
Potassium: 4.8 mmol/L (ref 3.5–5.1)
Sodium: 140 mmol/L (ref 135–145)

## 2020-02-22 LAB — CBC
HCT: 27.6 % — ABNORMAL LOW (ref 36.0–46.0)
Hemoglobin: 8.2 g/dL — ABNORMAL LOW (ref 12.0–15.0)
MCH: 30 pg (ref 26.0–34.0)
MCHC: 29.7 g/dL — ABNORMAL LOW (ref 30.0–36.0)
MCV: 101.1 fL — ABNORMAL HIGH (ref 80.0–100.0)
Platelets: 84 10*3/uL — ABNORMAL LOW (ref 150–400)
RBC: 2.73 MIL/uL — ABNORMAL LOW (ref 3.87–5.11)
RDW: 16 % — ABNORMAL HIGH (ref 11.5–15.5)
WBC: 8.9 10*3/uL (ref 4.0–10.5)
nRBC: 0 % (ref 0.0–0.2)

## 2020-02-22 LAB — GLUCOSE, CAPILLARY
Glucose-Capillary: 242 mg/dL — ABNORMAL HIGH (ref 70–99)
Glucose-Capillary: 212 mg/dL — ABNORMAL HIGH (ref 70–99)
Glucose-Capillary: 230 mg/dL — ABNORMAL HIGH (ref 70–99)
Glucose-Capillary: 208 mg/dL — ABNORMAL HIGH (ref 70–99)
Glucose-Capillary: 265 mg/dL — ABNORMAL HIGH (ref 70–99)

## 2020-02-22 LAB — MAGNESIUM: Magnesium: 1.8 mg/dL (ref 1.7–2.4)

## 2020-02-22 MED ORDER — INSULIN DETEMIR 100 UNIT/ML ~~LOC~~ SOLN
10.0000 [IU] | Freq: Every day | SUBCUTANEOUS | Status: DC
  Administered 2020-02-22 – 2020-02-26 (×5): 10 [IU] via SUBCUTANEOUS
  Filled 2020-02-22 (×5): qty 0.1

## 2020-02-22 MED ORDER — CHLORHEXIDINE GLUCONATE CLOTH 2 % EX PADS: 6.0000 | MEDICATED_PAD | Freq: Every day | CUTANEOUS | Status: DC

## 2020-02-22 NOTE — Progress Notes (Signed)
    Durable Medical Equipment  (From admission, onward)         Start     Ordered   02/22/20 1702  For home use only DME lightweight manual wheelchair with seat cushion  Once    Comments: Patient suffers from CHF which impairs their ability to perform daily activities like dressing and bathing in the home.  A walker will not resolve  issue with performing activities of daily living. A wheelchair will allow patient to safely perform daily activities. Patient is not able to propel themselves in the home using a standard weight wheelchair due to weakness. Patient can self propel in the lightweight wheelchair. Length of need lifetime. Accessories: elevating leg rests (ELRs), wheel locks, extensions and anti-tippers.   02/22/20 1702   02/22/20 1645  For home use only DME Hospital bed  Once    Question Answer Comment  Length of Need Lifetime   Patient has (list medical condition): CHF   The above medical condition requires: Patient requires the ability to reposition frequently   Head must be elevated greater than: 30 degrees   Bed type Semi-electric   Hoyer Lift Yes   Support Surface: Gel Overlay      02/22/20 1644   02/22/20 1644  For home use only DME 3 n 1  Once     02/22/20 1643

## 2020-02-22 NOTE — Plan of Care (Signed)
  Problem: Education: Goal: Ability to verbalize understanding of medication therapies will improve Outcome: Progressing   Problem: Activity: Goal: Capacity to carry out activities will improve Outcome: Progressing   Problem: Skin Integrity: Goal: Risk for impaired skin integrity will decrease Outcome: Progressing   Problem: Pain Managment: Goal: General experience of comfort will improve Outcome: Progressing

## 2020-02-22 NOTE — Progress Notes (Signed)
Inpatient Diabetes Program Recommendations  AACE/ADA: New Consensus Statement on Inpatient Glycemic Control (2015)  Target Ranges:  Prepandial:   less than 140 mg/dL      Peak postprandial:   less than 180 mg/dL (1-2 hours)      Critically ill patients:  140 - 180 mg/dL   Results for TALEISHA, KACZYNSKI (MRN [id number]) as of 02/22/2020 07:51  Ref. Range 02/21/2020 06:26 02/21/2020 11:21 02/21/2020 16:41 02/21/2020 21:24  Glucose-Capillary Latest Ref Range: 70 - 99 mg/dL 198 (H)  1 unit NOVOLOG  257 (H)  3 units NOVOLOG  320 (H)  4 units NOVOLOG  282 (H)  4 units NOVOLOG    Results for SALEHA, KALP (MRN [id number]) as of 02/22/2020 07:51  Ref. Range 02/22/2020 06:21  Glucose-Capillary Latest Ref Range: 70 - 99 mg/dL 242 (H)  2 units NOVOLOG    Results for SHERENE, PLANCARTE (MRN [id number]) as of 02/22/2020 07:51  Ref. Range 02/09/2020 01:22  Hemoglobin A1C Latest Ref Range: 4.8 - 5.6 % 6.0 (H)    Admit with: Right leg cellulitis/ Fluid overload and acute on chronic diastolic CHF/ Acute Kidney Injury  History: DM, CKD4, CVA, CHF  Home DM Meds: Humalog 0-9 units TID per SSI  Current Orders: Novolog 0-6 units TID with meals    New to Dialysis this admission.    MD- Per Chart Review, was taking Lantus 50 units QHS prior to admission on 08/09/2019--Admitted with Hypoglycemia (likely due to worsening renal function that admission) and was discharged home on 08/13/2019 with orders to stop the Lantus and take only Novolog SSI.  Note that AM CBGs have been elevated the last several days.  Please consider adding low dose Levemir to current inpatient insulin regimen:  Levemir 10 units Daily (~0.1 units/kg)     --Will follow patient during hospitalization--  Wyn Quaker RN, MSN, CDE Diabetes Coordinator Inpatient Glycemic Control Team Team Pager: [phone] (8a-5p)

## 2020-02-22 NOTE — Progress Notes (Signed)
  Speech Language Pathology Treatment: Dysphagia  Patient Details Name: [name] MRN: [id number] DOB: January 14, 1951 Today's Date: 02/22/2020 Time:  -     Assessment / Plan / Recommendation Clinical Impression  clinically pt presents with indications of prolonged oral transiting, mastication with solids and suspected piecemealing with liquids.  NO indication of airway compromise with po she would accept including water, pudding, medicine with pudding given by RN, Nepro, potatoes, and magic cup.  Her swallow delay increases time requirement to feed pt - she reports she currently is unable to feed herself.  Encourage intake especially liquid nutritional supplements - self feeding if able.  Despite pt being informed of need to consume adequate po, intake of breakfast was only approx 10%.  She did consume approx 3 ounces Nepro, 5 bites potatoes, 1 bite Magic cup and entire container of pudding *given by RN.   Using teach back, pt able to reiterate importance of eating to help to transition to going home. Per RN, daughter had advised pt's intake was not consistently adequate prior to admission.    Will continue to follow to encourage po and determine appropriateness for dietary advancement.  RN reports mentation is much better thus hope adequacy of po is near.     Pt did NOT LIKE ORANGE MAGIC CUP today and only consumed one bite.  HPI HPI: Pt is a 69 y/o female admitted 02/09/20 secondary to RLE cellulitis and volume overload. PMH includes CAD s/p stent, DM, CKD, CVA, dCHF, L BKA. Pt had a previous swallow evaluation in 2014 with functional appearing swallow until challenged with large, consecutive boluses. Regular solids and thin liquids via small, single sips recommended at that time. MRI 4/1: Small acute to subacute infarction of the left corona radiata.      SLP Plan  Continue with current plan of care       Recommendations  Diet recommendations: Dysphagia 2 (fine chop);Thin liquid Liquids  provided via: Cup;Straw Medication Administration: Whole meds with liquid Compensations: Slow rate;Small sips/bites;Follow solids with liquid;Other (Comment)(allow extra time for 2nd swallow per bolus) Postural Changes and/or Swallow Maneuvers: Seated upright 90 degrees(reverse trendelenberg with bed)                Oral Care Recommendations: Oral care BID Follow up Recommendations: Skilled Nursing facility SLP Visit Diagnosis: Dysphagia, oropharyngeal phase (R13.12) Plan: Continue with current plan of care       GO                [name] 02/22/2020, 11:20 AM  [name] Lime, MS [place] Office [phone]

## 2020-02-22 NOTE — Progress Notes (Addendum)
Calorie Count Note  RD working remotely.  48 hour calorie count ordered.  Diet: dysphagia 2 with thin liquids Supplements: Nepro Shake po BID, each supplement provides 425 kcal and 19 grams protein; Magic cup TID with meals, each supplement provides 290 kcal and 9 grams of protein; renal MVI daily  Pt remains with variable mentation and PO intake (meal completion 10-75% over the past 48 hours). Per SLP note, pt does not like orange Magic Cup. RN assisting pt with feeding. MD requesting calorie count.   4/5 Breakfast: 16 kcals, o grams protein Supplements: 1 Nepro supplement (425 kcals, 19 grams protein)  Nutrition Dx: Inadequate oral intake related to lethargy/confusion as evidenced by meal completion < 50%; ongoing  Goal: Patient will meet greater than or equal to 90% of their needs; progressing   Intervention:   -Initiate 48 hour calorie count per MD request -Continue renal MVI daily -Continue Nepro Shake po BID, each supplement provides 425 kcal and 19 grams protein -Continue Magic cup TID with meals, each supplement provides 290 kcal and 9 grams of protein -Feeding assistance with meals  Loistine Chance, RD, LDN, [place] Registered Dietitian II Certified Diabetes Care and Education Specialist Please refer to [facility] for RD and/or RD on-call/weekend/after hours pager

## 2020-02-22 NOTE — Progress Notes (Signed)
69 year old female status post right brachiocephalic AV fistula by myself last year.  We were asked to see her today by Dr. Jonnie Finner to evaluate superficialization of the fistula.  I went to see [name] and she has a great thrill in the right arm fistula.  She was somewhat agitated and could not tell me much about previous attempts at access.  Apparently concern from nephrology about depth.  I did review her fistula duplex and she does have some partially occlusive thrombus near the antecubitum and multiple side branches.  I discussed with the patient's daughter on the phone given palliative care visit today and appears that they are planning to proceed with dialysis.  She has a tunneled dialysis catheter placed by Dr. Donnetta Hutching that can be used for immediate needs.  I would plan fistulogram in the OR in order to make sure that this is salvageable and possible intervention at that time and then subsequent superficialization with sidebranch ligation.  Discussed with the daughter that there will be some chance that this does not work but given a great thrill on exam I would not give up on it at this point.  She has failed access in the other arm.  Will arrange in the next several weeks as an outpatient.  Marty Heck, MD [facility] Office: [phone]

## 2020-02-22 NOTE — TOC Progression Note (Signed)
Transition of Care [facility]) - Progression Note    Patient Details  Name: [name] MRN: [id number] Date of Birth: 1951-10-30  Transition of Care [facility]) CM/SW [place], [place] Phone Number: 02/22/2020, 2:51 PM  Clinical Narrative:    CSW followed up with patient's daughter [name] on transportation and Medicaid application. Daughter informed CSW that transportation application has been completed but information is still needed on Medicaid. Daughter requested CSW leave Medicaid information in patient's room. No further questions expressed at this time. CSW will continue to follow and assist with discharge planning needs.   Expected Discharge Plan: [place] Barriers to Discharge: Continued Medical Work up  Expected Discharge Plan and Services Expected Discharge Plan: [place] arrangements for the past 2 months: [place]                 DME Arranged: Hospital bed, 3-N-1(Hoyer lift) DME Agency: [employer]                   Social Determinants of Health (SDOH) Interventions    Readmission Risk Interventions No flowsheet data found.

## 2020-02-22 NOTE — TOC Progression Note (Signed)
Transition of Care [facility]) - Progression Note    Patient Details  Name: [name] MRN: [id number] Date of Birth: 1951-06-17  [facility] CM/SW Contact  Zenon Mayo, RN Phone Number: 02/22/2020, 5:13 PM  Clinical Narrative:    NCM spoke with Rogelio Seen , patient's daughter, informed her she has been set up by previous [facility] team member for HHRN,HHPT, and [facility] palliative services.  She is in agreement with this.  She states they do need a hospital bed with hoyer, w/chair and a 3 n 1.  NCM made referral to [facility] with [employer], he states this patient has a balance of 190.00 on account that has to be taken care of before they deliver any more DME.  NCM informed Deshawn and she to have [employer] call her and she will take care of it over the phone.     Expected Discharge Plan: [place] Barriers to Discharge: Continued Medical Work up  Expected Discharge Plan and Services Expected Discharge Plan: [place] arrangements for the past 2 months: [place]                 DME Arranged: 3-N-1, Hospital bed, Lightweight manual wheelchair with seat cushion(hoyer) DME Agency: [employer] Date DME Agency Contacted: 02/22/20 Time DME Agency Contacted: [phone] Representative spoke with at DME Agency: Thedore Mins HH Arranged: RN, PT [place] Agency: [place] Date [place]: 02/15/20 Time [place]: [phone] Representative spoke with at [facility]: [place] ([place]) Interventions    Readmission Risk Interventions No flowsheet data found.

## 2020-02-22 NOTE — Progress Notes (Signed)
[facility] Progress Note  Subjective: seen in room, responsive, partially oriented. No c/o's.   Vitals:   02/22/20 0100 02/22/20 0300 02/22/20 0443 02/22/20 0500  BP:   (!) 143/55   Pulse:   69   Resp: 20 (!) 25  19  Temp:   98.8 F (37.1 C)   TempSrc:   Oral   SpO2:   100%   Weight:   114.3 kg   Height:        Exam: Gen: Awake and comfortable nondistressed, answering questions w/o much difficulty , mostly oriented CVS: Pulse regularrhythm, normal rate, S1 and S2 normal. Right IJ hemodialysis catheter Resp: clear bilat w/o rales or wheezing Abd: Soft, flat, nontender Ext: Right leg inAce wrap,trace edema left ankle. Short pulsatile right upper arm AVF    Dialysis: new start   Assessment/ Plan 1.  End-stage renal disease: Lethargy seems to be now improving, possibly delayed response to initiation of dialysis.  She is now end-stage renal disease. RUA AVF will need to be raised up per VVS notes. TDC placed by [facility] on 4/3 and temp cath dc'd. Appreciate assistance. SP 5 HD sessions here. On TTS schedule here. HD tomorrow. Will need CLIP and elevation of the AVF as well prior to dc.  2. Right lower extremity cellulitis: Clinically improving on oral doxycycline. With [employer]. 3. History of congestive heart failure with diastolic dysfunction: Currently appears to be compensated withdialysis started. Wt's are down here. No vol issues on exam today 4. Anemia: Downtrending hemoglobin and hematocrit, with iron deficiency and on ESA.  Status post transfusion 02/18/2020. Hb 8-9 range.  5. Secondary hyperparathyroidism:Calcium and phosphorus levels currently at goal, poor oral intake and not on binders 6.  Debility - agree w/ primary that pt will need to get her strength back before she can be dc'd for OP dialysis.        Rob Barnet Benavides 02/22/2020, 12:24 PM   Recent Labs  Lab 02/15/20 1357 02/16/20 0506 02/20/20 0500 02/22/20 0528  K 3.8   < > 3.6 4.8  BUN  61*   < > 33* 36*  CREATININE 3.76*   < > 4.03* 4.09*  CALCIUM 8.5*   < > 8.4* 8.8*  PHOS 2.7  --   --   --   HGB  --    < > 7.7* 8.2*   < > = values in this interval not displayed.   Inpatient medications: . sodium chloride   Intravenous Once  . allopurinol  50 mg Oral Daily  . antiseptic oral rinse  15 mL Mouth Rinse BID  . atorvastatin  80 mg Oral q1800  . buPROPion  100 mg Oral TID  . carvedilol  25 mg Oral Q12H  . Chlorhexidine Gluconate Cloth  6 each Topical Daily  . clopidogrel  75 mg Oral Daily  . darbepoetin (ARANESP) injection - NON-DIALYSIS  60 mcg Subcutaneous Q Thu-1800  . feeding supplement (NEPRO CARB STEADY)  237 mL Oral BID BM  . folic acid  1 mg Oral Daily  . insulin aspart  0-6 Units Subcutaneous TID WC  . insulin detemir  10 Units Subcutaneous Daily  . lactulose  20 g Oral BID  . mirtazapine  7.5 mg Oral QHS  . multivitamin  1 tablet Oral QHS  . omega-3 acid ethyl esters  1 g Oral BID  . sodium chloride flush  3 mL Intravenous Q12H   . sodium chloride Stopped (02/10/20 1409)  . ferumoxytol  sodium chloride, acetaminophen **OR** acetaminophen, heparin, ondansetron **OR** ondansetron (ZOFRAN) IV, Resource ThickenUp Clear, sodium chloride flush

## 2020-02-22 NOTE — Progress Notes (Cosign Needed)
    Durable Medical Equipment  (From admission, onward)         Start     Ordered   02/22/20 1645  For home use only DME Hospital bed  Once    Question Answer Comment  Length of Need Lifetime   Patient has (list medical condition): CHF   The above medical condition requires: Patient requires the ability to reposition frequently   Head must be elevated greater than: 30 degrees   Bed type Semi-electric   Hoyer Lift Yes   Support Surface: Gel Overlay      02/22/20 1644   02/22/20 1644  For home use only DME 3 n 1  Once     02/22/20 1643

## 2020-02-22 NOTE — Progress Notes (Signed)
PROGRESS NOTE    [name]  PXT:[id number] DOB: February 03, 1951 DOA: 02/09/2020 PCP: Katherina Mires, MD   Brief Narrative:  69 year old female with history of DM-2, CAD s/p BMS to RCA, CVA, thrombocytopenia, diastolic CHF, CKD-5 with LUE aVF placed in 07/2019, PAD s/p left BKA presenting with progressive redness and edema of right lower extremity, and admitted for right lower extremity cellulitis, fluid overload and acute on chronic diastolic CHF.  Patient was started on IV vancomycin for cellulitis.    Developed AKI therefore nephrology team consulted initially started on Lasix and gentle hydration.  Renal function failed to improve therefore started on hemodialysis on 3/27.  Right IJ tunnel catheter placed 02/20/2020.   Assessment & Plan:   Principal Problem:   Acute on chronic diastolic CHF (congestive heart failure) (HCC) Active Problems:   Acute renal failure superimposed on stage 4 chronic kidney disease (HCC)   Type 2 diabetes mellitus with renal manifestations (HCC)   CAD S/P PCI TO RCA for Inferior STEMI. BMS   CKD (chronic kidney disease), stage IV (HCC)   Anemia in chronic kidney disease   CVA (cerebral vascular accident) ([place])   AKI (acute kidney injury) ([place])   Cellulitis of right leg   Thrombocytopenia (HCC)   Hyperbilirubinemia   Xerostomia   Advanced care planning/counseling discussion   Full code status   Dry skin   Physical debility   Palliative care by specialist   Goals of care, counseling/discussion   Acute CVA (cerebrovascular accident) ([place])  Acute kidney injury on CKD stage V Anemia of chronic disease -Getting dialysis via temporary dialysis catheter.  Nephrology social worker spoke with the family regarding outpatient arrangements, until she gets her strength back it will be difficult to get this done. -Right IJ tunneled catheter placed 02/20/2020 -Hemodialysis per nephrology team -BUN and creatinine improving. -Received 1 unit PRBC 4/1  Acute on chronic  diastolic congestive heart failure, ejection fraction 65% -Volume status remained stable with dialysis.  Right lower extremity cellulitis, resolved -Improved, completed 7 days of doxycycline, ended 6/27  Acute metabolic encephalopathy Acute/subacute CVA, corona radiata -Combination of uremia/hyperammonia.  No focal neuro deficits.  CT of the head is negative.  TSH, B12 are normal -Lactulose twice daily. -Continue supportive care, avoid sedative medication. -Continue Plavix and statin -Spoke with Dr Cheral Marker on 4/1 who reviewed patient's MRI.  No further input at this time.  Continue current management. -Hemoglobin A1c 6.9, LDL 12  Diabetes mellitus type 2 controlled -Hemoglobin A1c 6.0.  Continue current regimen. -Levemir 10 units daily.  Normocytic anemia Mild folate deficiency -Continue folic acid, insulin sliding scale and Accu-Cheks  History of peripheral arterial disease status post left-sided BKA -Continue Plavix, Lipitor and Zetia  Coronary artery disease status post bare-metal stent to RCA in 2010 -Continue home cardiac meds  Lack of appetite/poor oral intake Mild protein calorie malnutrition -Continue mirtazapine.  Encourage p.o. intake.  Swallow team following.  Recommending dysphagia 2 diet. -Added calorie count  Goals of care discussion-palliative care team will need to continue to follow.  DVT prophylaxis: None secondary to thrombocytopenia Code Status: Full code Family Communication: None Disposition Plan:   Patient From= home  Patient Anticipated D/C place= once cleared by nephrology, her main issue is being strong enough to get seat for outpatient hemodialysis.  Complicated disposition, family aware.  Subjective: Patient is laying in the bed.  When I asked her what is bothering her she said now is bothering her.  She appears to be annoyed that  she still has to be in the hospital but I explained her she is quite weak to be discharged and due to this weakness  we are unable to safely obtain any outpatient dialysis places.  Review of Systems Otherwise negative except as per HPI, including: General: Denies fever, chills, night sweats or unintended weight loss. Resp: Denies cough, wheezing, shortness of breath. Cardiac: Denies chest pain, palpitations, orthopnea, paroxysmal nocturnal dyspnea. GI: Denies abdominal pain, nausea, vomiting, diarrhea or constipation GU: Denies dysuria, frequency, hesitancy or incontinence MS: Denies muscle aches, joint pain or swelling Neuro: Denies headache, neurologic deficits (focal weakness, numbness, tingling), abnormal gait Psych: Denies anxiety, depression, SI/HI/AVH Skin: Denies new rashes or lesions ID: Denies sick contacts, exotic exposures, travel   Examination: Constitutional: Chronically ill and frail appearing Respiratory: Bibasilar rhonchi Cardiovascular: Normal sinus rhythm, no rubs Abdomen: Nontender nondistended good bowel sounds Musculoskeletal: No edema noted Skin: No rashes seen Neurologic: CN 2-12 grossly intact.  And nonfocal Psychiatric: Poor judgment and insight.  Alert to name and place only  Right IJ tunnel catheter noted  Objective: Vitals:   02/22/20 0100 02/22/20 0300 02/22/20 0443 02/22/20 0500  BP:   (!) 143/55   Pulse:   69   Resp: 20 (!) 25  19  Temp:   98.8 F (37.1 C)   TempSrc:   Oral   SpO2:   100%   Weight:   114.3 kg   Height:        Intake/Output Summary (Last 24 hours) at 02/22/2020 1211 Last data filed at 02/22/2020 0400 Gross per 24 hour  Intake 680 ml  Output 0 ml  Net 680 ml   Filed Weights   02/20/20 2250 02/21/20 0202 02/22/20 0443  Weight: 115.5 kg 113.5 kg 114.3 kg     Data Reviewed:   CBC: Recent Labs  Lab 02/17/20 0443 02/17/20 1410 02/18/20 0511 02/18/20 2352 02/19/20 0410 02/20/20 0500 02/22/20 0528  WBC 11.5*  --  11.2*  --  10.5 9.3 8.9  HGB 7.3*  --  7.3* 8.0* 8.0* 7.7* 8.2*  HCT 24.4*   < > 24.8* 25.8* 26.3* 25.9* 27.6*  MCV  102.5*  --  104.2*  --  100.4* 100.4* 101.1*  PLT 84*  --  90*  --  79* 81* 84*   < > = values in this interval not displayed.   Basic Metabolic Panel: Recent Labs  Lab 02/15/20 1357 02/15/20 1357 02/16/20 0506 02/17/20 0443 02/18/20 0511 02/19/20 0410 02/20/20 0500 02/22/20 0528  NA 141   < >  --  135 142 138 139 140  K 3.8   < >  --  3.6 4.0 3.4* 3.6 4.8  CL 103   < >  --  100 103 100 99 103  CO2 23   < >  --  26 25 28 27 25   GLUCOSE 143*   < >  --  170* 156* 151* 215* 240*  BUN 61*   < >  --  26* 39* 16 33* 36*  CREATININE 3.76*   < >  --  2.68* 4.19* 2.46* 4.03* 4.09*  CALCIUM 8.5*   < >  --  8.2* 8.7* 8.4* 8.4* 8.8*  MG  --   --  1.7  --  1.8 1.6* 1.7 1.8  PHOS 2.7  --   --   --   --   --   --   --    < > = values in this interval not displayed.  GFR: Estimated Creatinine Clearance: 15.8 mL/min (A) (by C-G formula based on SCr of 4.09 mg/dL (H)). Liver Function Tests: Recent Labs  Lab 02/15/20 1357 02/17/20 0443 02/18/20 0511 02/19/20 0410  AST  --  14* 12* 11*  ALT  --  19 18 14   ALKPHOS  --  109 95 99  BILITOT  --  1.6* 1.2 1.3*  PROT  --  5.4* 5.4* 5.7*  ALBUMIN 2.2* 2.0* 1.9* 2.0*   No results for input(s): LIPASE, AMYLASE in the last 168 hours. Recent Labs  Lab 02/17/20 0443  AMMONIA 19   Coagulation Profile: No results for input(s): INR, PROTIME in the last 168 hours. Cardiac Enzymes: No results for input(s): CKTOTAL, CKMB, CKMBINDEX, TROPONINI in the last 168 hours. BNP (last 3 results) No results for input(s): PROBNP in the last 8760 hours. HbA1C: No results for input(s): HGBA1C in the last 72 hours. CBG: Recent Labs  Lab 02/21/20 1641 02/21/20 2124 02/22/20 0621 02/22/20 0805 02/22/20 1138  GLUCAP 320* 282* 242* 212* 230*   Lipid Profile: No results for input(s): CHOL, HDL, LDLCALC, TRIG, CHOLHDL, LDLDIRECT in the last 72 hours. Thyroid Function Tests: No results for input(s): TSH, T4TOTAL, FREET4, T3FREE, THYROIDAB in the last 72  hours. Anemia Panel: No results for input(s): VITAMINB12, FOLATE, FERRITIN, TIBC, IRON, RETICCTPCT in the last 72 hours. Sepsis Labs: No results for input(s): PROCALCITON, LATICACIDVEN in the last 168 hours.  No results found for this or any previous visit (from the past 240 hour(s)).       Radiology Studies: No results found.      Scheduled Meds: . sodium chloride   Intravenous Once  . allopurinol  50 mg Oral Daily  . antiseptic oral rinse  15 mL Mouth Rinse BID  . atorvastatin  80 mg Oral q1800  . buPROPion  100 mg Oral TID  . carvedilol  25 mg Oral Q12H  . Chlorhexidine Gluconate Cloth  6 each Topical Daily  . clopidogrel  75 mg Oral Daily  . darbepoetin (ARANESP) injection - NON-DIALYSIS  60 mcg Subcutaneous Q Thu-1800  . feeding supplement (NEPRO CARB STEADY)  237 mL Oral BID BM  . folic acid  1 mg Oral Daily  . insulin aspart  0-6 Units Subcutaneous TID WC  . insulin detemir  10 Units Subcutaneous Daily  . lactulose  20 g Oral BID  . mirtazapine  7.5 mg Oral QHS  . multivitamin  1 tablet Oral QHS  . omega-3 acid ethyl esters  1 g Oral BID  . sodium chloride flush  3 mL Intravenous Q12H   Continuous Infusions: . sodium chloride Stopped (02/10/20 1409)  . ferumoxytol       LOS: 12 days   Time spent= 35 mins    [name] [name] Loader, MD [facility]  If 7PM-7AM, please contact night-coverage  02/22/2020, 12:11 PM   PROGRESS NOTE    [name]  KDT:[id number] DOB: [birth year] DOA: 02/09/2020 PCP: Katherina Mires, MD   Brief Narrative:  69 year old female with history of DM-2, CAD s/p BMS to RCA, CVA, thrombocytopenia, diastolic CHF, CKD-5 with LUE aVF placed in 07/2019, PAD s/p left BKA presenting with progressive redness and edema of right lower extremity, and admitted for right lower extremity cellulitis, fluid overload and acute on chronic diastolic CHF.  Patient was started on IV vancomycin for cellulitis.    Developed AKI therefore nephrology team  consulted initially started on Lasix and gentle hydration.  Renal function failed to improve therefore  started on hemodialysis on 3/27.  Right IJ tunnel catheter placed 02/20/2020.   Assessment & Plan:   Principal Problem:   Acute on chronic diastolic CHF (congestive heart failure) (HCC) Active Problems:   Acute renal failure superimposed on stage 4 chronic kidney disease (HCC)   Type 2 diabetes mellitus with renal manifestations (HCC)   CAD S/P PCI TO RCA for Inferior STEMI. BMS   CKD (chronic kidney disease), stage IV (HCC)   Anemia in chronic kidney disease   CVA (cerebral vascular accident) ([place])   AKI (acute kidney injury) ([place])   Cellulitis of right leg   Thrombocytopenia (HCC)   Hyperbilirubinemia   Xerostomia   Advanced care planning/counseling discussion   Full code status   Dry skin   Physical debility   Palliative care by specialist   Goals of care, counseling/discussion   Acute CVA (cerebrovascular accident) ([place])  Acute kidney injury on CKD stage V Anemia of chronic disease -Getting dialysis via temporary dialysis catheter.  Nephrology social worker spoke with the family regarding outpatient arrangements, until she gets her strength back it will be difficult to get this done. -Right IJ tunneled catheter placed 02/20/2020 -Hemodialysis per nephrology team -BUN and creatinine improving. -Status post 1 unit PRBC 4/1.  Acute on chronic diastolic congestive heart failure, ejection fraction 65% -Volume status is stable for now.  Stable with dialysis  Right lower extremity cellulitis, resolved -Improved, completed 7 days of doxycycline, ended 7/16  Acute metabolic encephalopathy Acute/subacute CVA, corona radiata -Combination of uremia/hyperammonia.  No focal neuro deficits.  CT of the head is negative.  TSH, B12 are normal -Lactulose twice daily -Continue supportive care, avoid sedative medication. -Continue Plavix and statin -Spoke with Dr Cheral Marker on 4/1 who  reviewed patient's MRI.  No further input at this time.  Continue current management. -Hemoglobin A1c 6.9, LDL 12  Diabetes mellitus type 2 controlled -Hemoglobin A1c 6.0.  Continue current regimen.  Normocytic anemia Mild folate deficiency -Continue folic acid, insulin sliding scale and Accu-Cheks  History of peripheral arterial disease status post left-sided BKA -Continue Plavix, Lipitor and Zetia  Coronary artery disease status post bare-metal stent to RCA in 2010 -Continue home cardiac meds  Lack of appetite/poor oral intake Mild protein calorie malnutrition -Continue mirtazapine.  Encourage p.o. intake.  Dietitian/speech and swallow working with her.MBS planned today  Goals of care discussion-palliative care team following    DVT prophylaxis: None secondary to thrombocytopenia Code Status: Full code Family Communication: None today Disposition Plan:   Patient From= home  Patient Anticipated D/C place= once cleared by nephrology, her main issue is being strong enough to get seat for outpatient hemodialysis.  Complicated disposition, family aware.  Subjective: No complaints this morning.  She is resting but easily arousable.  Review of Systems Otherwise negative except as per HPI, including: General = no fevers, chills, dizziness,  fatigue HEENT/EYES = negative for loss of vision, double vision, blurred vision,  sore throa Cardiovascular= negative for chest pain, palpitation Respiratory/lungs= negative for shortness of breath, cough, wheezing; hemoptysis,  Gastrointestinal= negative for nausea, vomiting, abdominal pain Genitourinary= negative for Dysuria MSK = Negative for arthralgia, myalgias Neurology= Negative for headache, numbness, tingling  Psychiatry= Negative for suicidal and homocidal ideation Skin= Negative for Rash   Examination:  Constitutional: Generally weak and chronically ill-appearing.  Not in acute distress at this time. Respiratory: Clear to  auscultation bilaterally Cardiovascular: Normal sinus rhythm, no rubs Abdomen: Nontender nondistended good bowel sounds Musculoskeletal: No edema noted Skin: No rashes  seen Neurologic: CN 2-12 grossly intact.  And nonfocal Psychiatric: Normal judgment and insight. Alert and oriented x 3. Normal mood.    Right IJ tunnel catheter noted  Objective: Vitals:   02/22/20 0100 02/22/20 0300 02/22/20 0443 02/22/20 0500  BP:   (!) 143/55   Pulse:   69   Resp: 20 (!) 25  19  Temp:   98.8 F (37.1 C)   TempSrc:   Oral   SpO2:   100%   Weight:   114.3 kg   Height:        Intake/Output Summary (Last 24 hours) at 02/22/2020 1211 Last data filed at 02/22/2020 0400 Gross per 24 hour  Intake 680 ml  Output 0 ml  Net 680 ml   Filed Weights   02/20/20 2250 02/21/20 0202 02/22/20 0443  Weight: 115.5 kg 113.5 kg 114.3 kg     Data Reviewed:   CBC: Recent Labs  Lab 02/17/20 0443 02/17/20 1410 02/18/20 0511 02/18/20 2352 02/19/20 0410 02/20/20 0500 02/22/20 0528  WBC 11.5*  --  11.2*  --  10.5 9.3 8.9  HGB 7.3*  --  7.3* 8.0* 8.0* 7.7* 8.2*  HCT 24.4*   < > 24.8* 25.8* 26.3* 25.9* 27.6*  MCV 102.5*  --  104.2*  --  100.4* 100.4* 101.1*  PLT 84*  --  90*  --  79* 81* 84*   < > = values in this interval not displayed.   Basic Metabolic Panel: Recent Labs  Lab 02/15/20 1357 02/15/20 1357 02/16/20 0506 02/17/20 0443 02/18/20 0511 02/19/20 0410 02/20/20 0500 02/22/20 0528  NA 141   < >  --  135 142 138 139 140  K 3.8   < >  --  3.6 4.0 3.4* 3.6 4.8  CL 103   < >  --  100 103 100 99 103  CO2 23   < >  --  26 25 28 27 25   GLUCOSE 143*   < >  --  170* 156* 151* 215* 240*  BUN 61*   < >  --  26* 39* 16 33* 36*  CREATININE 3.76*   < >  --  2.68* 4.19* 2.46* 4.03* 4.09*  CALCIUM 8.5*   < >  --  8.2* 8.7* 8.4* 8.4* 8.8*  MG  --   --  1.7  --  1.8 1.6* 1.7 1.8  PHOS 2.7  --   --   --   --   --   --   --    < > = values in this interval not displayed.   GFR: Estimated Creatinine  Clearance: 15.8 mL/min (A) (by C-G formula based on SCr of 4.09 mg/dL (H)). Liver Function Tests: Recent Labs  Lab 02/15/20 1357 02/17/20 0443 02/18/20 0511 02/19/20 0410  AST  --  14* 12* 11*  ALT  --  19 18 14   ALKPHOS  --  109 95 99  BILITOT  --  1.6* 1.2 1.3*  PROT  --  5.4* 5.4* 5.7*  ALBUMIN 2.2* 2.0* 1.9* 2.0*   No results for input(s): LIPASE, AMYLASE in the last 168 hours. Recent Labs  Lab 02/17/20 0443  AMMONIA 19   Coagulation Profile: No results for input(s): INR, PROTIME in the last 168 hours. Cardiac Enzymes: No results for input(s): CKTOTAL, CKMB, CKMBINDEX, TROPONINI in the last 168 hours. BNP (last 3 results) No results for input(s): PROBNP in the last 8760 hours. HbA1C: No results for input(s): HGBA1C in the  last 72 hours. CBG: Recent Labs  Lab 02/21/20 1641 02/21/20 2124 02/22/20 0621 02/22/20 0805 02/22/20 1138  GLUCAP 320* 282* 242* 212* 230*   Lipid Profile: No results for input(s): CHOL, HDL, LDLCALC, TRIG, CHOLHDL, LDLDIRECT in the last 72 hours. Thyroid Function Tests: No results for input(s): TSH, T4TOTAL, FREET4, T3FREE, THYROIDAB in the last 72 hours. Anemia Panel: No results for input(s): VITAMINB12, FOLATE, FERRITIN, TIBC, IRON, RETICCTPCT in the last 72 hours. Sepsis Labs: No results for input(s): PROCALCITON, LATICACIDVEN in the last 168 hours.  No results found for this or any previous visit (from the past 240 hour(s)).       Radiology Studies: No results found.      Scheduled Meds: . sodium chloride   Intravenous Once  . allopurinol  50 mg Oral Daily  . antiseptic oral rinse  15 mL Mouth Rinse BID  . atorvastatin  80 mg Oral q1800  . buPROPion  100 mg Oral TID  . carvedilol  25 mg Oral Q12H  . Chlorhexidine Gluconate Cloth  6 each Topical Daily  . clopidogrel  75 mg Oral Daily  . darbepoetin (ARANESP) injection - NON-DIALYSIS  60 mcg Subcutaneous Q Thu-1800  . feeding supplement (NEPRO CARB STEADY)  237 mL Oral  BID BM  . folic acid  1 mg Oral Daily  . insulin aspart  0-6 Units Subcutaneous TID WC  . insulin detemir  10 Units Subcutaneous Daily  . lactulose  20 g Oral BID  . mirtazapine  7.5 mg Oral QHS  . multivitamin  1 tablet Oral QHS  . omega-3 acid ethyl esters  1 g Oral BID  . sodium chloride flush  3 mL Intravenous Q12H   Continuous Infusions: . sodium chloride Stopped (02/10/20 1409)  . ferumoxytol       LOS: 12 days   Time spent= 35 mins    [name] [name] Loader, MD [facility]  If 7PM-7AM, please contact night-coverage  02/22/2020, 12:11 PM   PROGRESS NOTE    [name]  JQB:[id number] DOB: [birth year] DOA: 02/09/2020 PCP: Katherina Mires, MD   Brief Narrative:  69 year old female with history of DM-2, CAD s/p BMS to RCA, CVA, thrombocytopenia, diastolic CHF, CKD-5 with LUE aVF placed in 07/2019, PAD s/p left BKA presenting with progressive redness and edema of right lower extremity, and admitted for right lower extremity cellulitis, fluid overload and acute on chronic diastolic CHF.  Patient was started on IV vancomycin for cellulitis.    Developed AKI therefore nephrology team consulted initially started on Lasix and gentle hydration.  Renal function failed to improve therefore started on hemodialysis on 3/27.  Right IJ tunnel catheter placed 02/20/2020.   Assessment & Plan:   Principal Problem:   Acute on chronic diastolic CHF (congestive heart failure) (HCC) Active Problems:   Acute renal failure superimposed on stage 4 chronic kidney disease (HCC)   Type 2 diabetes mellitus with renal manifestations (HCC)   CAD S/P PCI TO RCA for Inferior STEMI. BMS   CKD (chronic kidney disease), stage IV (HCC)   Anemia in chronic kidney disease   CVA (cerebral vascular accident) ([place])   AKI (acute kidney injury) ([place])   Cellulitis of right leg   Thrombocytopenia (HCC)   Hyperbilirubinemia   Xerostomia   Advanced care planning/counseling discussion   Full code status   Dry  skin   Physical debility   Palliative care by specialist   Goals of care, counseling/discussion   Acute CVA (cerebrovascular accident) ([place])  Acute kidney injury on CKD stage V Anemia of chronic disease -Getting dialysis via temporary dialysis catheter.  Nephrology social worker spoke with the family regarding outpatient arrangements, until she gets her strength back it will be difficult to get this done. -Right IJ tunneled catheter placed 02/20/2020 -Hemodialysis per nephrology team -BUN and creatinine improving. -Status post 1 unit PRBC 4/1.  Acute on chronic diastolic congestive heart failure, ejection fraction 65% -Volume status is stable for now.  Stable with dialysis  Right lower extremity cellulitis, resolved -Improved, completed 7 days of doxycycline, ended 0/86  Acute metabolic encephalopathy Acute/subacute CVA, corona radiata -Combination of uremia/hyperammonia.  No focal neuro deficits.  CT of the head is negative.  TSH, B12 are normal -Lactulose twice daily -Continue supportive care, avoid sedative medication. -Continue Plavix and statin -Spoke with Dr Cheral Marker on 4/1 who reviewed patient's MRI.  No further input at this time.  Continue current management. -Hemoglobin A1c 6.9, LDL 12  Diabetes mellitus type 2 controlled -Hemoglobin A1c 6.0.  Continue current regimen.  Normocytic anemia Mild folate deficiency -Continue folic acid, insulin sliding scale and Accu-Cheks  History of peripheral arterial disease status post left-sided BKA -Continue Plavix, Lipitor and Zetia  Coronary artery disease status post bare-metal stent to RCA in 2010 -Continue home cardiac meds  Lack of appetite/poor oral intake Mild protein calorie malnutrition -Continue mirtazapine.  Encourage p.o. intake.  Dietitian/speech and swallow working with her.MBS planned today  Goals of care discussion-palliative care team following    DVT prophylaxis: None secondary to thrombocytopenia Code  Status: Full code Family Communication: None today Disposition Plan:   Patient From= home  Patient Anticipated D/C place= once cleared by nephrology, her main issue is being strong enough to get seat for outpatient hemodialysis.  Complicated disposition, family aware.  Subjective: No complaints this morning.  She is resting but easily arousable.  Review of Systems Otherwise negative except as per HPI, including: General = no fevers, chills, dizziness,  fatigue HEENT/EYES = negative for loss of vision, double vision, blurred vision,  sore throa Cardiovascular= negative for chest pain, palpitation Respiratory/lungs= negative for shortness of breath, cough, wheezing; hemoptysis,  Gastrointestinal= negative for nausea, vomiting, abdominal pain Genitourinary= negative for Dysuria MSK = Negative for arthralgia, myalgias Neurology= Negative for headache, numbness, tingling  Psychiatry= Negative for suicidal and homocidal ideation Skin= Negative for Rash   Examination:  Constitutional: Generally weak and chronically ill-appearing.  Not in acute distress at this time. Respiratory: Clear to auscultation bilaterally Cardiovascular: Normal sinus rhythm, no rubs Abdomen: Nontender nondistended good bowel sounds Musculoskeletal: No edema noted Skin: No rashes seen Neurologic: CN 2-12 grossly intact.  And nonfocal Psychiatric: Normal judgment and insight. Alert and oriented x 3. Normal mood.    Right IJ tunnel catheter noted  Objective: Vitals:   02/22/20 0100 02/22/20 0300 02/22/20 0443 02/22/20 0500  BP:   (!) 143/55   Pulse:   69   Resp: 20 (!) 25  19  Temp:   98.8 F (37.1 C)   TempSrc:   Oral   SpO2:   100%   Weight:   114.3 kg   Height:        Intake/Output Summary (Last 24 hours) at 02/22/2020 1211 Last data filed at 02/22/2020 0400 Gross per 24 hour  Intake 680 ml  Output 0 ml  Net 680 ml   Filed Weights   02/20/20 2250 02/21/20 0202 02/22/20 0443  Weight: 115.5 kg  113.5 kg 114.3 kg  Data Reviewed:   CBC: Recent Labs  Lab 02/17/20 0443 02/17/20 1410 02/18/20 0511 02/18/20 2352 02/19/20 0410 02/20/20 0500 02/22/20 0528  WBC 11.5*  --  11.2*  --  10.5 9.3 8.9  HGB 7.3*  --  7.3* 8.0* 8.0* 7.7* 8.2*  HCT 24.4*   < > 24.8* 25.8* 26.3* 25.9* 27.6*  MCV 102.5*  --  104.2*  --  100.4* 100.4* 101.1*  PLT 84*  --  90*  --  79* 81* 84*   < > = values in this interval not displayed.   Basic Metabolic Panel: Recent Labs  Lab 02/15/20 1357 02/15/20 1357 02/16/20 0506 02/17/20 0443 02/18/20 0511 02/19/20 0410 02/20/20 0500 02/22/20 0528  NA 141   < >  --  135 142 138 139 140  K 3.8   < >  --  3.6 4.0 3.4* 3.6 4.8  CL 103   < >  --  100 103 100 99 103  CO2 23   < >  --  26 25 28 27 25   GLUCOSE 143*   < >  --  170* 156* 151* 215* 240*  BUN 61*   < >  --  26* 39* 16 33* 36*  CREATININE 3.76*   < >  --  2.68* 4.19* 2.46* 4.03* 4.09*  CALCIUM 8.5*   < >  --  8.2* 8.7* 8.4* 8.4* 8.8*  MG  --   --  1.7  --  1.8 1.6* 1.7 1.8  PHOS 2.7  --   --   --   --   --   --   --    < > = values in this interval not displayed.   GFR: Estimated Creatinine Clearance: 15.8 mL/min (A) (by C-G formula based on SCr of 4.09 mg/dL (H)). Liver Function Tests: Recent Labs  Lab 02/15/20 1357 02/17/20 0443 02/18/20 0511 02/19/20 0410  AST  --  14* 12* 11*  ALT  --  19 18 14   ALKPHOS  --  109 95 99  BILITOT  --  1.6* 1.2 1.3*  PROT  --  5.4* 5.4* 5.7*  ALBUMIN 2.2* 2.0* 1.9* 2.0*   No results for input(s): LIPASE, AMYLASE in the last 168 hours. Recent Labs  Lab 02/17/20 0443  AMMONIA 19   Coagulation Profile: No results for input(s): INR, PROTIME in the last 168 hours. Cardiac Enzymes: No results for input(s): CKTOTAL, CKMB, CKMBINDEX, TROPONINI in the last 168 hours. BNP (last 3 results) No results for input(s): PROBNP in the last 8760 hours. HbA1C: No results for input(s): HGBA1C in the last 72 hours. CBG: Recent Labs  Lab 02/21/20 1641  02/21/20 2124 02/22/20 0621 02/22/20 0805 02/22/20 1138  GLUCAP 320* 282* 242* 212* 230*   Lipid Profile: No results for input(s): CHOL, HDL, LDLCALC, TRIG, CHOLHDL, LDLDIRECT in the last 72 hours. Thyroid Function Tests: No results for input(s): TSH, T4TOTAL, FREET4, T3FREE, THYROIDAB in the last 72 hours. Anemia Panel: No results for input(s): VITAMINB12, FOLATE, FERRITIN, TIBC, IRON, RETICCTPCT in the last 72 hours. Sepsis Labs: No results for input(s): PROCALCITON, LATICACIDVEN in the last 168 hours.  No results found for this or any previous visit (from the past 240 hour(s)).       Radiology Studies: No results found.      Scheduled Meds: . sodium chloride   Intravenous Once  . allopurinol  50 mg Oral Daily  . antiseptic oral rinse  15 mL Mouth Rinse BID  . atorvastatin  80 mg Oral q1800  . buPROPion  100 mg Oral TID  . carvedilol  25 mg Oral Q12H  . Chlorhexidine Gluconate Cloth  6 each Topical Daily  . clopidogrel  75 mg Oral Daily  . darbepoetin (ARANESP) injection - NON-DIALYSIS  60 mcg Subcutaneous Q Thu-1800  . feeding supplement (NEPRO CARB STEADY)  237 mL Oral BID BM  . folic acid  1 mg Oral Daily  . insulin aspart  0-6 Units Subcutaneous TID WC  . insulin detemir  10 Units Subcutaneous Daily  . lactulose  20 g Oral BID  . mirtazapine  7.5 mg Oral QHS  . multivitamin  1 tablet Oral QHS  . omega-3 acid ethyl esters  1 g Oral BID  . sodium chloride flush  3 mL Intravenous Q12H   Continuous Infusions: . sodium chloride Stopped (02/10/20 1409)  . ferumoxytol       LOS: 12 days   Time spent= 35 mins    [name] [name] Loader, MD [facility]  If 7PM-7AM, please contact night-coverage  02/22/2020, 12:11 PM

## 2020-02-22 NOTE — Progress Notes (Signed)
 Palliative Medicine Inpatient Follow Up Note   HPI: [name] Villais a 68 y.o.femalewith medical history significant forhypertension, diabetes, coronary artery disease, history of CVA, chronic diastolic CHF, and CKD 4, frequent falls, now presenting to the emergency department with progressive redness, swelling, and pain involving the right lower extremity. New initiation of dialysis.  Palliative Care was asked to get involved to help establish ongoing goals of care.  Today's Discussion (02/22/2020): Chart reviewed. Met with [name] at bedside, she appeared quite similarly to the last time I saw her. She was slow to respond though denied pain, dyspnea, and nausea.   Discussed the importance of continued conversation with family and their  medical providers regarding overall plan of care and treatment options, ensuring decisions are within the context of the patients values and GOCs.  Questions and concerns addressed   Vital Signs Vitals:   02/22/20 0443 02/22/20 0500  BP: (!) 143/55   Pulse: 69   Resp:  19  Temp: 98.8 F (37.1 C)   SpO2: 100%     Intake/Output Summary (Last 24 hours) at 02/22/2020 1307 Last data filed at 02/22/2020 0400 Gross per 24 hour  Intake 460 ml  Output 0 ml  Net 460 ml   Last Weight  Most recent update: 02/22/2020  5:57 AM   Weight  114.3 kg (251 lb 15.8 oz)           Physical Exam Vitals and nursing note reviewed.  Constitutional:      Comments: Ill appearing obese F, slow to respond to questions  HENT:     Head: Normocephalic.     Nose: Nose normal.     Mouth/Throat:     Mouth: Mucous membranes are dry.  Eyes:     Pupils: Pupils are equal, round, and reactive to light.  Cardiovascular:     Rate and Rhythm: Normal rate and regular rhythm.     Pulses: Normal pulses.  Pulmonary:     Effort: Pulmonary effort is normal.  Abdominal:     Palpations: Abdomen is soft.  Musculoskeletal:        General: Normal range of motion.     Cervical  back: Normal range of motion.  Skin:    General: Skin is dry.     Capillary Refill: Capillary refill takes less than 2 seconds.  Neurological:     Mental Status: She is disoriented.  Psychiatric:     Comments: Withdrawn   SUMMARY OF RECOMMENDATIONS   Full Code / Full Scope  OP Palliative Care  SW to help arrange transportation services ([facility])   Financial Counselor --> Family interested in enrolling in medicaid, given information how how to reach case management  TOC --> Order DMEs  Chaplain Consult  Symptom Management:  Physical Debility -                  - Physical Therapy                  - Occupational Therapy  Dry Skin:                 - Emollient cream daily   Xerostomia:                 - BID mouth care                 - Biotene  Dysphagia:                 -   Swallow evaluation rec. Dysphagia 2 diet                 - 1:1 feeding  ESRD:                 - Newly initiated on HD  - OP arrangement  Dispo:                 - Home with HH and OP Palliative Care   Time Spent: 25 Greater than 50% of the time was spent in counseling and coordination of care ______________________________________________________________________________________ Michelle Ferolito Marin Palliative Medicine Team Team Cell Phone: [phone] Please utilize secure chat with additional questions, if there is no response within 30 minutes please call the above phone number  Palliative Medicine Team providers are available by phone from 7am to 7pm daily and can be reached through the team cell phone.  Should this patient require assistance outside of these hours, please call the patient's attending physician.     

## 2020-02-23 LAB — BASIC METABOLIC PANEL
Anion gap: 11 (ref 5–15)
BUN: 52 mg/dL — ABNORMAL HIGH (ref 8–23)
CO2: 26 mmol/L (ref 22–32)
Calcium: 8.8 mg/dL — ABNORMAL LOW (ref 8.9–10.3)
Chloride: 100 mmol/L (ref 98–111)
Creatinine, Ser: 4.86 mg/dL — ABNORMAL HIGH (ref 0.44–1.00)
GFR calc Af Amer: 10 mL/min — ABNORMAL LOW (ref >60)
GFR calc non Af Amer: 9 mL/min — ABNORMAL LOW (ref >60)
Glucose, Bld: 230 mg/dL — ABNORMAL HIGH (ref 70–99)
Potassium: 5.1 mmol/L (ref 3.5–5.1)
Sodium: 137 mmol/L (ref 135–145)

## 2020-02-23 LAB — GLUCOSE, CAPILLARY
Glucose-Capillary: 229 mg/dL — ABNORMAL HIGH (ref 70–99)
Glucose-Capillary: 95 mg/dL (ref 70–99)
Glucose-Capillary: 214 mg/dL — ABNORMAL HIGH (ref 70–99)

## 2020-02-23 LAB — CBC
HCT: 27.3 % — ABNORMAL LOW (ref 36.0–46.0)
Hemoglobin: 8.1 g/dL — ABNORMAL LOW (ref 12.0–15.0)
MCH: 29.7 pg (ref 26.0–34.0)
MCHC: 29.7 g/dL — ABNORMAL LOW (ref 30.0–36.0)
MCV: 100 fL (ref 80.0–100.0)
Platelets: 79 10*3/uL — ABNORMAL LOW (ref 150–400)
RBC: 2.73 MIL/uL — ABNORMAL LOW (ref 3.87–5.11)
RDW: 16 % — ABNORMAL HIGH (ref 11.5–15.5)
WBC: 8 10*3/uL (ref 4.0–10.5)
nRBC: 0 % (ref 0.0–0.2)

## 2020-02-23 LAB — MAGNESIUM: Magnesium: 1.9 mg/dL (ref 1.7–2.4)

## 2020-02-23 MED ORDER — HEPARIN SODIUM (PORCINE) 1000 UNIT/ML DIALYSIS: 3000.0000 [IU] | INTRAMUSCULAR | Status: DC | PRN

## 2020-02-23 MED ORDER — PENTAFLUOROPROP-TETRAFLUOROETH EX AERO: 1.0000 "application " | INHALATION_SPRAY | CUTANEOUS | Status: DC | PRN

## 2020-02-23 MED ORDER — HEPARIN SODIUM (PORCINE) 1000 UNIT/ML DIALYSIS: 1000.0000 [IU] | INTRAMUSCULAR | Status: DC | PRN

## 2020-02-23 MED ORDER — LIDOCAINE HCL (PF) 1 % IJ SOLN: 5.0000 mL | INTRAMUSCULAR | Status: DC | PRN

## 2020-02-23 MED ORDER — LIDOCAINE-PRILOCAINE 2.5-2.5 % EX CREA: 1.0000 "application " | TOPICAL_CREAM | CUTANEOUS | Status: DC | PRN

## 2020-02-23 MED ORDER — SODIUM CHLORIDE 0.9 % IV SOLN: 100.0000 mL | INTRAVENOUS | Status: DC | PRN

## 2020-02-23 MED ORDER — HEPARIN SODIUM (PORCINE) 1000 UNIT/ML DIALYSIS: 40.0000 [IU]/kg | INTRAMUSCULAR | Status: DC | PRN

## 2020-02-23 MED ORDER — PRO-STAT SUGAR FREE PO LIQD
30.0000 mL | Freq: Three times a day (TID) | ORAL | Status: DC
  Administered 2020-02-23 – 2020-02-25 (×7): 30 mL via ORAL
  Filled 2020-02-23 (×6): qty 30

## 2020-02-23 MED ORDER — ALTEPLASE 2 MG IJ SOLR: 2.0000 mg | Freq: Once | INTRAMUSCULAR | Status: DC | PRN

## 2020-02-23 NOTE — Progress Notes (Signed)
PROGRESS NOTE    [name]  NKN:[id number] DOB: 06-08-1951 DOA: 02/09/2020 PCP: Katherina Mires, MD   Brief Narrative:  69 year old female with history of DM-2, CAD s/p BMS to RCA, CVA, thrombocytopenia, diastolic CHF, CKD-5 with LUE aVF placed in 07/2019, PAD s/p left BKA presenting with progressive redness and edema of right lower extremity, and admitted for right lower extremity cellulitis, fluid overload and acute on chronic diastolic CHF.  Patient was started on IV vancomycin for cellulitis.    Developed AKI therefore nephrology team consulted initially started on Lasix and gentle hydration.  Renal function failed to improve therefore started on hemodialysis on 3/27.  Right IJ tunnel catheter placed 02/20/2020.  Nutrition has been a big issue to have her gain her strength.  Nutrition team following, calorie count ongoing.   Assessment & Plan:   Principal Problem:   Acute on chronic diastolic CHF (congestive heart failure) (HCC) Active Problems:   Acute renal failure superimposed on stage 4 chronic kidney disease (HCC)   Type 2 diabetes mellitus with renal manifestations (HCC)   CAD S/P PCI TO RCA for Inferior STEMI. BMS   CKD (chronic kidney disease), stage IV (HCC)   Anemia in chronic kidney disease   CVA (cerebral vascular accident) ([place])   AKI (acute kidney injury) ([place])   Cellulitis of right leg   Thrombocytopenia (HCC)   Hyperbilirubinemia   Xerostomia   Advanced care planning/counseling discussion   Full code status   Dry skin   Physical debility   Palliative care by specialist   Goals of care, counseling/discussion   Acute CVA (cerebrovascular accident) ([place])   Pressure injury of skin  Acute kidney injury on CKD stage V Anemia of chronic disease -Getting dialysis via temporary dialysis catheter.  Nephrology social worker spoke with the family regarding outpatient arrangements, until she gets her strength back it will be difficult to get this done. -Right IJ tunneled  catheter placed 02/20/2020 -Ongoing dialysis per nephrology team. -BUN and creatinine improving. -Received 1 unit PRBC 4/1  Acute on chronic diastolic congestive heart failure, ejection fraction 65% -Volume status maintained with dialysis  Right lower extremity cellulitis, resolved -Improved, completed 7 days of doxycycline, ended 3/79  Acute metabolic encephalopathy Acute/subacute CVA, corona radiata -Combination of uremia/hyperammonia.  No focal neuro deficits.  CT of the head is negative.  TSH, B12 are normal -Lactulose twice daily. -Continue supportive care, avoid sedative medication. -Continue Plavix and statin -Spoke with Dr Cheral Marker on 4/1 who reviewed patient's MRI.  No further input at this time.  Continue current management. -Hemoglobin A1c 6.9, LDL 12  Diabetes mellitus type 2 controlled -Hemoglobin A1c 6.0.  Continue current regimen. -Levemir 10 units daily.  Normocytic anemia Mild folate deficiency Thrombocytopenia -Continue folic acid, insulin sliding scale and Accu-Cheks  History of peripheral arterial disease status post left-sided BKA -Continue Plavix, Lipitor and Zetia  Coronary artery disease status post bare-metal stent to RCA in 2010 -Continue home cardiac meds  Lack of appetite/poor oral intake Mild protein calorie malnutrition -Continue mirtazapine.  Encourage p.o. intake.  Swallow team following.  Recommending dysphagia 2 diet. -Ongoing calorie count  Goals of care discussion-palliative care team will need to continue to follow.  DVT prophylaxis: None secondary to thrombocytopenia Code Status: Full code Family Communication: Spoke with daughter in the room Disposition Plan:   Patient From= home  Patient Anticipated D/C place= once cleared by nephrology, her main issue is being strong enough to get seat for outpatient hemodialysis.  Complicated disposition,  family aware.  Subjective: No complaints, no acute events overnight.  Continues to have  poor oral intake.  Seems to like breakfast food better.  Review of Systems Otherwise negative except as per HPI, including: General: Denies fever, chills, night sweats or unintended weight loss. Resp: Denies cough, wheezing, shortness of breath. Cardiac: Denies chest pain, palpitations, orthopnea, paroxysmal nocturnal dyspnea. GI: Denies abdominal pain, nausea, vomiting, diarrhea or constipation GU: Denies dysuria, frequency, hesitancy or incontinence MS: Denies muscle aches, joint pain or swelling Neuro: Denies headache, neurologic deficits (focal weakness, numbness, tingling), abnormal gait Psych: Denies anxiety, depression, SI/HI/AVH Skin: Denies new rashes or lesions ID: Denies sick contacts, exotic exposures, travel  Examination: Constitutional: Chronically ill and frail appearing Respiratory: Clear to auscultation bilaterally Cardiovascular: Normal sinus rhythm, no rubs Abdomen: Nontender nondistended good bowel sounds Musculoskeletal: No edema noted Skin: No rashes seen Neurologic: CN 2-12 grossly intact.  And nonfocal Psychiatric: Poor judgment and insight.  Alert to name and place only  Right IJ tunnel catheter noted  Objective: Vitals:   02/23/20 0930 02/23/20 1000 02/23/20 1014 02/23/20 1121  BP: (!) 124/53 (!) 119/53 (!) 152/63 (!) 138/50  Pulse: 61 62 63 64  Resp:   16 16  Temp:   (!) 97.4 F (36.3 C) 97.7 F (36.5 C)  TempSrc:   Oral Oral  SpO2:   100% 100%  Weight:   113 kg   Height:        Intake/Output Summary (Last 24 hours) at 02/23/2020 1303 Last data filed at 02/23/2020 1014 Gross per 24 hour  Intake 358 ml  Output 2519 ml  Net -2161 ml   Filed Weights   02/23/20 0100 02/23/20 0700 02/23/20 1014  Weight: 118 kg 121 kg 113 kg     Data Reviewed:   CBC: Recent Labs  Lab 02/18/20 0511 02/18/20 0511 02/18/20 2352 02/19/20 0410 02/20/20 0500 02/22/20 0528 02/23/20 0410  WBC 11.2*  --   --  10.5 9.3 8.9 8.0  HGB 7.3*   < > 8.0* 8.0* 7.7*  8.2* 8.1*  HCT 24.8*   < > 25.8* 26.3* 25.9* 27.6* 27.3*  MCV 104.2*  --   --  100.4* 100.4* 101.1* 100.0  PLT 90*  --   --  79* 81* 84* 79*   < > = values in this interval not displayed.   Basic Metabolic Panel: Recent Labs  Lab 02/18/20 0511 02/19/20 0410 02/20/20 0500 02/22/20 0528 02/23/20 0410  NA 142 138 139 140 137  K 4.0 3.4* 3.6 4.8 5.1  CL 103 100 99 103 100  CO2 25 28 27 25 26   GLUCOSE 156* 151* 215* 240* 230*  BUN 39* 16 33* 36* 52*  CREATININE 4.19* 2.46* 4.03* 4.09* 4.86*  CALCIUM 8.7* 8.4* 8.4* 8.8* 8.8*  MG 1.8 1.6* 1.7 1.8 1.9   GFR: Estimated Creatinine Clearance: 13.2 mL/min (A) (by C-G formula based on SCr of 4.86 mg/dL (H)). Liver Function Tests: Recent Labs  Lab 02/17/20 0443 02/18/20 0511 02/19/20 0410  AST 14* 12* 11*  ALT 19 18 14   ALKPHOS 109 95 99  BILITOT 1.6* 1.2 1.3*  PROT 5.4* 5.4* 5.7*  ALBUMIN 2.0* 1.9* 2.0*   No results for input(s): LIPASE, AMYLASE in the last 168 hours. Recent Labs  Lab 02/17/20 0443  AMMONIA 19   Coagulation Profile: No results for input(s): INR, PROTIME in the last 168 hours. Cardiac Enzymes: No results for input(s): CKTOTAL, CKMB, CKMBINDEX, TROPONINI in the last 168 hours. BNP (last  3 results) No results for input(s): PROBNP in the last 8760 hours. HbA1C: No results for input(s): HGBA1C in the last 72 hours. CBG: Recent Labs  Lab 02/22/20 1138 02/22/20 1634 02/22/20 2114 02/23/20 0601 02/23/20 1124  GLUCAP 230* 208* 265* 229* 95   Lipid Profile: No results for input(s): CHOL, HDL, LDLCALC, TRIG, CHOLHDL, LDLDIRECT in the last 72 hours. Thyroid Function Tests: No results for input(s): TSH, T4TOTAL, FREET4, T3FREE, THYROIDAB in the last 72 hours. Anemia Panel: No results for input(s): VITAMINB12, FOLATE, FERRITIN, TIBC, IRON, RETICCTPCT in the last 72 hours. Sepsis Labs: No results for input(s): PROCALCITON, LATICACIDVEN in the last 168 hours.  No results found for this or any previous visit  (from the past 240 hour(s)).       Radiology Studies: No results found.      Scheduled Meds: . sodium chloride   Intravenous Once  . allopurinol  50 mg Oral Daily  . antiseptic oral rinse  15 mL Mouth Rinse BID  . atorvastatin  80 mg Oral q1800  . buPROPion  100 mg Oral TID  . carvedilol  25 mg Oral Q12H  . Chlorhexidine Gluconate Cloth  6 each Topical Daily  . Chlorhexidine Gluconate Cloth  6 each Topical Q0600  . clopidogrel  75 mg Oral Daily  . darbepoetin (ARANESP) injection - NON-DIALYSIS  60 mcg Subcutaneous Q Thu-1800  . feeding supplement (NEPRO CARB STEADY)  237 mL Oral BID BM  . folic acid  1 mg Oral Daily  . insulin aspart  0-6 Units Subcutaneous TID WC  . insulin detemir  10 Units Subcutaneous Daily  . lactulose  20 g Oral BID  . mirtazapine  7.5 mg Oral QHS  . multivitamin  1 tablet Oral QHS  . omega-3 acid ethyl esters  1 g Oral BID  . sodium chloride flush  3 mL Intravenous Q12H   Continuous Infusions: . sodium chloride Stopped (02/10/20 1409)  . ferumoxytol       LOS: 13 days   Time spent= 35 mins    Britteny Fiebelkorn Arsenio Loader, MD [facility]  If 7PM-7AM, please contact night-coverage  02/23/2020, 1:03 PM   PROGRESS NOTE    [name]  GXQ:[id number] DOB: 1951/09/09 DOA: 02/09/2020 PCP: Katherina Mires, MD   Brief Narrative:  69 year old female with history of DM-2, CAD s/p BMS to RCA, CVA, thrombocytopenia, diastolic CHF, CKD-5 with LUE aVF placed in 07/2019, PAD s/p left BKA presenting with progressive redness and edema of right lower extremity, and admitted for right lower extremity cellulitis, fluid overload and acute on chronic diastolic CHF.  Patient was started on IV vancomycin for cellulitis.    Developed AKI therefore nephrology team consulted initially started on Lasix and gentle hydration.  Renal function failed to improve therefore started on hemodialysis on 3/27.  Right IJ tunnel catheter placed 02/20/2020.   Assessment & Plan:     Principal Problem:   Acute on chronic diastolic CHF (congestive heart failure) (HCC) Active Problems:   Acute renal failure superimposed on stage 4 chronic kidney disease (HCC)   Type 2 diabetes mellitus with renal manifestations (HCC)   CAD S/P PCI TO RCA for Inferior STEMI. BMS   CKD (chronic kidney disease), stage IV (HCC)   Anemia in chronic kidney disease   CVA (cerebral vascular accident) ([place])   AKI (acute kidney injury) ([place])   Cellulitis of right leg   Thrombocytopenia (HCC)   Hyperbilirubinemia   Xerostomia   Advanced care planning/counseling discussion  Full code status   Dry skin   Physical debility   Palliative care by specialist   Goals of care, counseling/discussion   Acute CVA (cerebrovascular accident) ([place])   Pressure injury of skin  Acute kidney injury on CKD stage V Anemia of chronic disease -Getting dialysis via temporary dialysis catheter.  Nephrology social worker spoke with the family regarding outpatient arrangements, until she gets her strength back it will be difficult to get this done. -Right IJ tunneled catheter placed 02/20/2020 -Hemodialysis per nephrology team -BUN and creatinine improving. -Status post 1 unit PRBC 4/1.  Acute on chronic diastolic congestive heart failure, ejection fraction 65% -Volume status is stable for now.  Stable with dialysis  Right lower extremity cellulitis, resolved -Improved, completed 7 days of doxycycline, ended 2/54  Acute metabolic encephalopathy Acute/subacute CVA, corona radiata -Combination of uremia/hyperammonia.  No focal neuro deficits.  CT of the head is negative.  TSH, B12 are normal -Lactulose twice daily -Continue supportive care, avoid sedative medication. -Continue Plavix and statin -Spoke with Dr Cheral Marker on 4/1 who reviewed patient's MRI.  No further input at this time.  Continue current management. -Hemoglobin A1c 6.9, LDL 12  Diabetes mellitus type 2 controlled -Hemoglobin A1c 6.0.  Continue  current regimen.  Normocytic anemia Mild folate deficiency -Continue folic acid, insulin sliding scale and Accu-Cheks  History of peripheral arterial disease status post left-sided BKA -Continue Plavix, Lipitor and Zetia  Coronary artery disease status post bare-metal stent to RCA in 2010 -Continue home cardiac meds  Lack of appetite/poor oral intake Mild protein calorie malnutrition -Continue mirtazapine.  Encourage p.o. intake.  Dietitian/speech and swallow working with her.MBS planned today  Goals of care discussion-palliative care team following    DVT prophylaxis: None secondary to thrombocytopenia Code Status: Full code Family Communication: None today Disposition Plan:   Patient From= home  Patient Anticipated D/C place= once cleared by nephrology, her main issue is being strong enough to get seat for outpatient hemodialysis.  Complicated disposition, family aware.  Subjective: No complaints this morning.  She is resting but easily arousable.  Review of Systems Otherwise negative except as per HPI, including: General = no fevers, chills, dizziness,  fatigue HEENT/EYES = negative for loss of vision, double vision, blurred vision,  sore throa Cardiovascular= negative for chest pain, palpitation Respiratory/lungs= negative for shortness of breath, cough, wheezing; hemoptysis,  Gastrointestinal= negative for nausea, vomiting, abdominal pain Genitourinary= negative for Dysuria MSK = Negative for arthralgia, myalgias Neurology= Negative for headache, numbness, tingling  Psychiatry= Negative for suicidal and homocidal ideation Skin= Negative for Rash   Examination:  Constitutional: Generally weak and chronically ill-appearing.  Not in acute distress at this time. Respiratory: Clear to auscultation bilaterally Cardiovascular: Normal sinus rhythm, no rubs Abdomen: Nontender nondistended good bowel sounds Musculoskeletal: No edema noted Skin: No rashes  seen Neurologic: CN 2-12 grossly intact.  And nonfocal Psychiatric: Normal judgment and insight. Alert and oriented x 3. Normal mood.    Right IJ tunnel catheter noted  Objective: Vitals:   02/23/20 0930 02/23/20 1000 02/23/20 1014 02/23/20 1121  BP: (!) 124/53 (!) 119/53 (!) 152/63 (!) 138/50  Pulse: 61 62 63 64  Resp:   16 16  Temp:   (!) 97.4 F (36.3 C) 97.7 F (36.5 C)  TempSrc:   Oral Oral  SpO2:   100% 100%  Weight:   113 kg   Height:        Intake/Output Summary (Last 24 hours) at 02/23/2020 1303 Last data filed  at 02/23/2020 1014 Gross per 24 hour  Intake 358 ml  Output 2519 ml  Net -2161 ml   Filed Weights   02/23/20 0100 02/23/20 0700 02/23/20 1014  Weight: 118 kg 121 kg 113 kg     Data Reviewed:   CBC: Recent Labs  Lab 02/18/20 0511 02/18/20 0511 02/18/20 2352 02/19/20 0410 02/20/20 0500 02/22/20 0528 02/23/20 0410  WBC 11.2*  --   --  10.5 9.3 8.9 8.0  HGB 7.3*   < > 8.0* 8.0* 7.7* 8.2* 8.1*  HCT 24.8*   < > 25.8* 26.3* 25.9* 27.6* 27.3*  MCV 104.2*  --   --  100.4* 100.4* 101.1* 100.0  PLT 90*  --   --  79* 81* 84* 79*   < > = values in this interval not displayed.   Basic Metabolic Panel: Recent Labs  Lab 02/18/20 0511 02/19/20 0410 02/20/20 0500 02/22/20 0528 02/23/20 0410  NA 142 138 139 140 137  K 4.0 3.4* 3.6 4.8 5.1  CL 103 100 99 103 100  CO2 25 28 27 25 26   GLUCOSE 156* 151* 215* 240* 230*  BUN 39* 16 33* 36* 52*  CREATININE 4.19* 2.46* 4.03* 4.09* 4.86*  CALCIUM 8.7* 8.4* 8.4* 8.8* 8.8*  MG 1.8 1.6* 1.7 1.8 1.9   GFR: Estimated Creatinine Clearance: 13.2 mL/min (A) (by C-G formula based on SCr of 4.86 mg/dL (H)). Liver Function Tests: Recent Labs  Lab 02/17/20 0443 02/18/20 0511 02/19/20 0410  AST 14* 12* 11*  ALT 19 18 14   ALKPHOS 109 95 99  BILITOT 1.6* 1.2 1.3*  PROT 5.4* 5.4* 5.7*  ALBUMIN 2.0* 1.9* 2.0*   No results for input(s): LIPASE, AMYLASE in the last 168 hours. Recent Labs  Lab 02/17/20 0443   AMMONIA 19   Coagulation Profile: No results for input(s): INR, PROTIME in the last 168 hours. Cardiac Enzymes: No results for input(s): CKTOTAL, CKMB, CKMBINDEX, TROPONINI in the last 168 hours. BNP (last 3 results) No results for input(s): PROBNP in the last 8760 hours. HbA1C: No results for input(s): HGBA1C in the last 72 hours. CBG: Recent Labs  Lab 02/22/20 1138 02/22/20 1634 02/22/20 2114 02/23/20 0601 02/23/20 1124  GLUCAP 230* 208* 265* 229* 95   Lipid Profile: No results for input(s): CHOL, HDL, LDLCALC, TRIG, CHOLHDL, LDLDIRECT in the last 72 hours. Thyroid Function Tests: No results for input(s): TSH, T4TOTAL, FREET4, T3FREE, THYROIDAB in the last 72 hours. Anemia Panel: No results for input(s): VITAMINB12, FOLATE, FERRITIN, TIBC, IRON, RETICCTPCT in the last 72 hours. Sepsis Labs: No results for input(s): PROCALCITON, LATICACIDVEN in the last 168 hours.  No results found for this or any previous visit (from the past 240 hour(s)).       Radiology Studies: No results found.      Scheduled Meds: . sodium chloride   Intravenous Once  . allopurinol  50 mg Oral Daily  . antiseptic oral rinse  15 mL Mouth Rinse BID  . atorvastatin  80 mg Oral q1800  . buPROPion  100 mg Oral TID  . carvedilol  25 mg Oral Q12H  . Chlorhexidine Gluconate Cloth  6 each Topical Daily  . Chlorhexidine Gluconate Cloth  6 each Topical Q0600  . clopidogrel  75 mg Oral Daily  . darbepoetin (ARANESP) injection - NON-DIALYSIS  60 mcg Subcutaneous Q Thu-1800  . feeding supplement (NEPRO CARB STEADY)  237 mL Oral BID BM  . folic acid  1 mg Oral Daily  . insulin aspart  0-6 Units Subcutaneous TID WC  . insulin detemir  10 Units Subcutaneous Daily  . lactulose  20 g Oral BID  . mirtazapine  7.5 mg Oral QHS  . multivitamin  1 tablet Oral QHS  . omega-3 acid ethyl esters  1 g Oral BID  . sodium chloride flush  3 mL Intravenous Q12H   Continuous Infusions: . sodium chloride Stopped  (02/10/20 1409)  . ferumoxytol       LOS: 13 days   Time spent= 35 mins    Myrene Bougher Arsenio Loader, MD [facility]  If 7PM-7AM, please contact night-coverage  02/23/2020, 1:03 PM   PROGRESS NOTE    [name]  [id number]:[id number] DOB: 27-Feb-1951 DOA: 02/09/2020 PCP: Katherina Mires, MD   Brief Narrative:  69 year old female with history of DM-2, CAD s/p BMS to RCA, CVA, thrombocytopenia, diastolic CHF, CKD-5 with LUE aVF placed in 07/2019, PAD s/p left BKA presenting with progressive redness and edema of right lower extremity, and admitted for right lower extremity cellulitis, fluid overload and acute on chronic diastolic CHF.  Patient was started on IV vancomycin for cellulitis.    Developed AKI therefore nephrology team consulted initially started on Lasix and gentle hydration.  Renal function failed to improve therefore started on hemodialysis on 3/27.  Right IJ tunnel catheter placed 02/20/2020.   Assessment & Plan:   Principal Problem:   Acute on chronic diastolic CHF (congestive heart failure) (HCC) Active Problems:   Acute renal failure superimposed on stage 4 chronic kidney disease (HCC)   Type 2 diabetes mellitus with renal manifestations (HCC)   CAD S/P PCI TO RCA for Inferior STEMI. BMS   CKD (chronic kidney disease), stage IV (HCC)   Anemia in chronic kidney disease   CVA (cerebral vascular accident) ([place])   AKI (acute kidney injury) ([place])   Cellulitis of right leg   Thrombocytopenia (HCC)   Hyperbilirubinemia   Xerostomia   Advanced care planning/counseling discussion   Full code status   Dry skin   Physical debility   Palliative care by specialist   Goals of care, counseling/discussion   Acute CVA (cerebrovascular accident) ([place])   Pressure injury of skin  Acute kidney injury on CKD stage V Anemia of chronic disease -Getting dialysis via temporary dialysis catheter.  Nephrology social worker spoke with the family regarding outpatient arrangements, until she  gets her strength back it will be difficult to get this done. -Right IJ tunneled catheter placed 02/20/2020 -Hemodialysis per nephrology team -BUN and creatinine improving. -Status post 1 unit PRBC 4/1.  Acute on chronic diastolic congestive heart failure, ejection fraction 65% -Volume status is stable for now.  Stable with dialysis  Right lower extremity cellulitis, resolved -Improved, completed 7 days of doxycycline, ended 1/54  Acute metabolic encephalopathy Acute/subacute CVA, corona radiata -Combination of uremia/hyperammonia.  No focal neuro deficits.  CT of the head is negative.  TSH, B12 are normal -Lactulose twice daily -Continue supportive care, avoid sedative medication. -Continue Plavix and statin -Spoke with Dr Cheral Marker on 4/1 who reviewed patient's MRI.  No further input at this time.  Continue current management. -Hemoglobin A1c 6.9, LDL 12  Diabetes mellitus type 2 controlled -Hemoglobin A1c 6.0.  Continue current regimen.  Normocytic anemia Mild folate deficiency -Continue folic acid, insulin sliding scale and Accu-Cheks  History of peripheral arterial disease status post left-sided BKA -Continue Plavix, Lipitor and Zetia  Coronary artery disease status post bare-metal stent to RCA in 2010 -Continue home cardiac meds  Lack of  appetite/poor oral intake Mild protein calorie malnutrition -Continue mirtazapine.  Encourage p.o. intake.  Dietitian/speech and swallow working with her.MBS planned today  Goals of care discussion-palliative care team following    DVT prophylaxis: None secondary to thrombocytopenia Code Status: Full code Family Communication: None today Disposition Plan:   Patient From= home  Patient Anticipated D/C place= once cleared by nephrology, her main issue is being strong enough to get seat for outpatient hemodialysis.  Complicated disposition, family aware.  Subjective: No complaints this morning.  She is resting but easily  arousable.  Review of Systems Otherwise negative except as per HPI, including: General = no fevers, chills, dizziness,  fatigue HEENT/EYES = negative for loss of vision, double vision, blurred vision,  sore throa Cardiovascular= negative for chest pain, palpitation Respiratory/lungs= negative for shortness of breath, cough, wheezing; hemoptysis,  Gastrointestinal= negative for nausea, vomiting, abdominal pain Genitourinary= negative for Dysuria MSK = Negative for arthralgia, myalgias Neurology= Negative for headache, numbness, tingling  Psychiatry= Negative for suicidal and homocidal ideation Skin= Negative for Rash   Examination:  Constitutional: Generally weak and chronically ill-appearing.  Not in acute distress at this time. Respiratory: Clear to auscultation bilaterally Cardiovascular: Normal sinus rhythm, no rubs Abdomen: Nontender nondistended good bowel sounds Musculoskeletal: No edema noted Skin: No rashes seen Neurologic: CN 2-12 grossly intact.  And nonfocal Psychiatric: Normal judgment and insight. Alert and oriented x 3. Normal mood.    Right IJ tunnel catheter noted  Objective: Vitals:   02/23/20 0930 02/23/20 1000 02/23/20 1014 02/23/20 1121  BP: (!) 124/53 (!) 119/53 (!) 152/63 (!) 138/50  Pulse: 61 62 63 64  Resp:   16 16  Temp:   (!) 97.4 F (36.3 C) 97.7 F (36.5 C)  TempSrc:   Oral Oral  SpO2:   100% 100%  Weight:   113 kg   Height:        Intake/Output Summary (Last 24 hours) at 02/23/2020 1303 Last data filed at 02/23/2020 1014 Gross per 24 hour  Intake 358 ml  Output 2519 ml  Net -2161 ml   Filed Weights   02/23/20 0100 02/23/20 0700 02/23/20 1014  Weight: 118 kg 121 kg 113 kg     Data Reviewed:   CBC: Recent Labs  Lab 02/18/20 0511 02/18/20 0511 02/18/20 2352 02/19/20 0410 02/20/20 0500 02/22/20 0528 02/23/20 0410  WBC 11.2*  --   --  10.5 9.3 8.9 8.0  HGB 7.3*   < > 8.0* 8.0* 7.7* 8.2* 8.1*  HCT 24.8*   < > 25.8* 26.3* 25.9*  27.6* 27.3*  MCV 104.2*  --   --  100.4* 100.4* 101.1* 100.0  PLT 90*  --   --  79* 81* 84* 79*   < > = values in this interval not displayed.   Basic Metabolic Panel: Recent Labs  Lab 02/18/20 0511 02/19/20 0410 02/20/20 0500 02/22/20 0528 02/23/20 0410  NA 142 138 139 140 137  K 4.0 3.4* 3.6 4.8 5.1  CL 103 100 99 103 100  CO2 25 28 27 25 26   GLUCOSE 156* 151* 215* 240* 230*  BUN 39* 16 33* 36* 52*  CREATININE 4.19* 2.46* 4.03* 4.09* 4.86*  CALCIUM 8.7* 8.4* 8.4* 8.8* 8.8*  MG 1.8 1.6* 1.7 1.8 1.9   GFR: Estimated Creatinine Clearance: 13.2 mL/min (A) (by C-G formula based on SCr of 4.86 mg/dL (H)). Liver Function Tests: Recent Labs  Lab 02/17/20 0443 02/18/20 0511 02/19/20 0410  AST 14* 12* 11*  ALT 19  18 14  ALKPHOS 109 95 99  BILITOT 1.6* 1.2 1.3*  PROT 5.4* 5.4* 5.7*  ALBUMIN 2.0* 1.9* 2.0*   No results for input(s): LIPASE, AMYLASE in the last 168 hours. Recent Labs  Lab 02/17/20 0443  AMMONIA 19   Coagulation Profile: No results for input(s): INR, PROTIME in the last 168 hours. Cardiac Enzymes: No results for input(s): CKTOTAL, CKMB, CKMBINDEX, TROPONINI in the last 168 hours. BNP (last 3 results) No results for input(s): PROBNP in the last 8760 hours. HbA1C: No results for input(s): HGBA1C in the last 72 hours. CBG: Recent Labs  Lab 02/22/20 1138 02/22/20 1634 02/22/20 2114 02/23/20 0601 02/23/20 1124  GLUCAP 230* 208* 265* 229* 95   Lipid Profile: No results for input(s): CHOL, HDL, LDLCALC, TRIG, CHOLHDL, LDLDIRECT in the last 72 hours. Thyroid Function Tests: No results for input(s): TSH, T4TOTAL, FREET4, T3FREE, THYROIDAB in the last 72 hours. Anemia Panel: No results for input(s): VITAMINB12, FOLATE, FERRITIN, TIBC, IRON, RETICCTPCT in the last 72 hours. Sepsis Labs: No results for input(s): PROCALCITON, LATICACIDVEN in the last 168 hours.  No results found for this or any previous visit (from the past 240 hour(s)).        Radiology Studies: No results found.      Scheduled Meds: . sodium chloride   Intravenous Once  . allopurinol  50 mg Oral Daily  . antiseptic oral rinse  15 mL Mouth Rinse BID  . atorvastatin  80 mg Oral q1800  . buPROPion  100 mg Oral TID  . carvedilol  25 mg Oral Q12H  . Chlorhexidine Gluconate Cloth  6 each Topical Daily  . Chlorhexidine Gluconate Cloth  6 each Topical Q0600  . clopidogrel  75 mg Oral Daily  . darbepoetin (ARANESP) injection - NON-DIALYSIS  60 mcg Subcutaneous Q Thu-1800  . feeding supplement (NEPRO CARB STEADY)  237 mL Oral BID BM  . folic acid  1 mg Oral Daily  . insulin aspart  0-6 Units Subcutaneous TID WC  . insulin detemir  10 Units Subcutaneous Daily  . lactulose  20 g Oral BID  . mirtazapine  7.5 mg Oral QHS  . multivitamin  1 tablet Oral QHS  . omega-3 acid ethyl esters  1 g Oral BID  . sodium chloride flush  3 mL Intravenous Q12H   Continuous Infusions: . sodium chloride Stopped (02/10/20 1409)  . ferumoxytol       LOS: 13 days   Time spent= 35 mins    Kemyah Buser Arsenio Loader, MD [facility]  If 7PM-7AM, please contact night-coverage  02/23/2020, 1:03 PM

## 2020-02-23 NOTE — Progress Notes (Signed)
Calorie Count Note  48 hour calorie count ordered.  Diet: dysphagia 2 with thin liquids Supplements: Nepro Shake po BID, each supplement provides 425 kcal and 19 grams protein; Magic cup TID with meals, each supplement provides 290 kcal and 9 grams of protein; renal MVI daily  Pt out of room at time of visit. Meal completion remains minimal. She is not consuming [employer].   4/5 Breakfast: 16 kcals, 0 grams protein Lunch: refused (0 kcals, 0 grams protein) Dinner: 295 kcals, 6 grams protein Supplements: 1 Nepro supplement (425 kcals, 19 grams protein)  Total intake: 736 kcal (45% of minimum estimated needs)  25 grams protein (25% of minimum estimated needs)  Nutrition Dx: Inadequate oral intakerelated to lethargy/confusionas evidenced by meal completion < 50%; ongoing  Goal: Patient will meet greater than or equal to 90% of their needs; progressing   Intervention:   -Continue 48 hour calorie count per MD request -Continue renal MVI daily -Continue Nepro Shake po BID, each supplement provides 425 kcal and 19 grams protein -D/c Magic cup TID with meals, each supplement provides 290 kcal and 9 grams of protein -Continue feeding assistance with meals -30 ml Prostat TID, each supplement provides 100 kcals and 15 grams protein  Loistine Chance, RD, LDN, [place] Registered Dietitian II Certified Diabetes Care and Education Specialist Please refer to [facility] for RD and/or RD on-call/weekend/after hours pager

## 2020-02-23 NOTE — Consult Note (Signed)
[place] Nurse Consult Note: Patient receiving care in [place].  Assisted with turning by primary RN. Reason for Consult: sacral wound Wound type: highly likely that the bilateral buttocks/sacral very small, scattered areas of skin impairment are related to friction, shear, fecal incontinence (which she had at the time of my assessment). Pressure Injury POA: Yes/No/NA Measurement: right buttock areas of hypopigmentation, with a very small 1 cm x 0.3 cm area missing overlying epithelial tissue.  The left buttocks also has areas of hypopigmentation with a tiny, 0.5 cm x 0.5 cm area missing overlying epithelial tissue.  A sacral foam was in place at the time of my visit. Wound bed: 100% pink Drainage (amount, consistency, odor) none Periwound: intact Dressing procedure/placement/frequency: I initiated the Skin Care Order set for skin cleansing products and use of a foam dressing to the area. Monitor the wound area(s) for worsening of condition such as: Signs/symptoms of infection,  Increase in size,  Development of or worsening of odor, Development of pain, or increased pain at the affected locations.  Notify the medical team if any of these develop.  Thank you for the consult.  Discussed plan of care with the bedside nurse.  [place] nurse will not follow at this time.  Please re-consult the [place] team if needed.  Val Riles, RN, MSN, CWOCN, CNS-BC, pager [phone]

## 2020-02-23 NOTE — Progress Notes (Signed)
[facility] Progress Note  Subjective: seen on HD   Vitals:   02/23/20 0800 02/23/20 0830 02/23/20 0900 02/23/20 0930  BP: (!) 138/54 (!) 145/57 (!) 115/52 (!) 124/53  Pulse: 61 61 60 61  Resp:      Temp:      TempSrc:      SpO2:      Weight:      Height:        Exam: Gen: Awake  nondistressed, answering questions w/o much difficulty , mostly oriented CVS: Pulse regularrhythm, normal rate, S1 and S2 normal. Right IJ hemodialysis catheter Resp: clear bilat w/o rales or wheezing Abd: Soft, flat, nontender Ext: Right leg inAce wrap,trace edema left ankle. + right upper arm AVF +TDC Neuro: Ox 2, nonfocal   Dialysis: new start   Assessment/ Plan 1.  End-stage renal disease: Lethargy seems to be now improving, possibly delayed response to initiation of dialysis.  New start to HD. RUA AVF seen by [facility], very strong thrill, will need fistulogram in OP setting to determine if usable and then superficialization if it is usable. [place] placed 4/3. Appreciate assistance. On TTS schedule here. HD today. Will need CLIP but not ready for dc due to debility and not eating.   2. Right lower extremity cellulitis: Clinically improving on oral doxycycline. With [employer]. 3. History of congestive heart failure with diastolic dysfunction: Currently appears to be compensated withdialysis started. Wt's are down here. No vol issues on exam today 4. Anemia: Downtrending hemoglobin and hematocrit, with iron deficiency and on ESA.  Status post transfusion 02/18/2020. Hb 8-9 range.  5. Secondary hyperparathyroidism:Calcium and phosphorus levels currently at goal, poor oral intake and not on binders 6.  Debility/ dispo - agree w/ primary that pt will need to get her strength back before she can be dc'd for OP dialysis.     Rob Laporsha Grealish 02/23/2020, 9:39 AM   Recent Labs  Lab 02/22/20 0528 02/23/20 0410  K 4.8 5.1  BUN 36* 52*  CREATININE 4.09* 4.86*  CALCIUM 8.8* 8.8*  HGB 8.2*  8.1*   Inpatient medications: . sodium chloride   Intravenous Once  . allopurinol  50 mg Oral Daily  . antiseptic oral rinse  15 mL Mouth Rinse BID  . atorvastatin  80 mg Oral q1800  . buPROPion  100 mg Oral TID  . carvedilol  25 mg Oral Q12H  . Chlorhexidine Gluconate Cloth  6 each Topical Daily  . Chlorhexidine Gluconate Cloth  6 each Topical Q0600  . clopidogrel  75 mg Oral Daily  . darbepoetin (ARANESP) injection - NON-DIALYSIS  60 mcg Subcutaneous Q Thu-1800  . feeding supplement (NEPRO CARB STEADY)  237 mL Oral BID BM  . folic acid  1 mg Oral Daily  . insulin aspart  0-6 Units Subcutaneous TID WC  . insulin detemir  10 Units Subcutaneous Daily  . lactulose  20 g Oral BID  . mirtazapine  7.5 mg Oral QHS  . multivitamin  1 tablet Oral QHS  . omega-3 acid ethyl esters  1 g Oral BID  . sodium chloride flush  3 mL Intravenous Q12H   . sodium chloride Stopped (02/10/20 1409)  . sodium chloride    . sodium chloride    . ferumoxytol     sodium chloride, sodium chloride, sodium chloride, acetaminophen **OR** acetaminophen, alteplase, heparin, [START ON 02/24/2020] heparin, heparin, heparin, lidocaine (PF), lidocaine-prilocaine, ondansetron **OR** ondansetron (ZOFRAN) IV, pentafluoroprop-tetrafluoroeth, Resource ThickenUp Clear, sodium chloride flush

## 2020-02-23 NOTE — Progress Notes (Signed)
Orthopedic Tech Progress Note Patient Details:  [name] October 09, 1951 [id number]  Ortho Devices Type of Ortho Device: Haematologist Ortho Device/Splint Location: RLE Ortho Device/Splint Interventions: Application   Post Interventions Patient Tolerated: Well Instructions Provided: Care of [place] 02/23/2020, 6:39 PM

## 2020-02-23 NOTE — Plan of Care (Signed)
  Problem: Education: Goal: Ability to demonstrate management of disease process will improve Outcome: Progressing   Problem: Education: Goal: Ability to verbalize understanding of medication therapies will improve Outcome: Progressing   Problem: Health Behavior/Discharge Planning: Goal: Ability to manage health-related needs will improve Outcome: Progressing   Problem: Nutrition: Goal: Adequate nutrition will be maintained Outcome: Progressing   Problem: Safety: Goal: Ability to remain free from injury will improve Outcome: Progressing   Problem: Skin Integrity: Goal: Risk for impaired skin integrity will decrease Outcome: Progressing

## 2020-02-24 LAB — CBC
HCT: 26.9 % — ABNORMAL LOW (ref 36.0–46.0)
Hemoglobin: 8 g/dL — ABNORMAL LOW (ref 12.0–15.0)
MCH: 30.1 pg (ref 26.0–34.0)
MCHC: 29.7 g/dL — ABNORMAL LOW (ref 30.0–36.0)
MCV: 101.1 fL — ABNORMAL HIGH (ref 80.0–100.0)
Platelets: 74 10*3/uL — ABNORMAL LOW (ref 150–400)
RBC: 2.66 MIL/uL — ABNORMAL LOW (ref 3.87–5.11)
RDW: 16.2 % — ABNORMAL HIGH (ref 11.5–15.5)
WBC: 7.1 10*3/uL (ref 4.0–10.5)
nRBC: 0.3 % — ABNORMAL HIGH (ref 0.0–0.2)

## 2020-02-24 LAB — BASIC METABOLIC PANEL
Anion gap: 12 (ref 5–15)
BUN: 34 mg/dL — ABNORMAL HIGH (ref 8–23)
CO2: 27 mmol/L (ref 22–32)
Calcium: 8.4 mg/dL — ABNORMAL LOW (ref 8.9–10.3)
Chloride: 100 mmol/L (ref 98–111)
Creatinine, Ser: 3.52 mg/dL — ABNORMAL HIGH (ref 0.44–1.00)
GFR calc Af Amer: 15 mL/min — ABNORMAL LOW (ref >60)
GFR calc non Af Amer: 13 mL/min — ABNORMAL LOW (ref >60)
Glucose, Bld: 245 mg/dL — ABNORMAL HIGH (ref 70–99)
Potassium: 4.4 mmol/L (ref 3.5–5.1)
Sodium: 139 mmol/L (ref 135–145)

## 2020-02-24 LAB — MAGNESIUM: Magnesium: 1.8 mg/dL (ref 1.7–2.4)

## 2020-02-24 LAB — GLUCOSE, CAPILLARY
Glucose-Capillary: 263 mg/dL — ABNORMAL HIGH (ref 70–99)
Glucose-Capillary: 216 mg/dL — ABNORMAL HIGH (ref 70–99)
Glucose-Capillary: 169 mg/dL — ABNORMAL HIGH (ref 70–99)
Glucose-Capillary: 193 mg/dL — ABNORMAL HIGH (ref 70–99)
Glucose-Capillary: 168 mg/dL — ABNORMAL HIGH (ref 70–99)

## 2020-02-24 MED ORDER — SODIUM CHLORIDE 0.9 % IV SOLN
510.0000 mg | Freq: Once | INTRAVENOUS | Status: AC
  Administered 2020-02-24: 510 mg via INTRAVENOUS
  Filled 2020-02-24: qty 17

## 2020-02-24 MED ORDER — DARBEPOETIN ALFA 60 MCG/0.3ML IJ SOSY: 60.0000 ug | PREFILLED_SYRINGE | INTRAMUSCULAR | Status: DC

## 2020-02-24 NOTE — Progress Notes (Signed)
Physical Therapy Treatment Patient Details Name: [name] MRN: [id number] DOB: 11-May-1951 Today's Date: 02/24/2020    History of Present Illness Pt is a 69 y/o female admitted 02/09/20 secondary to RLE cellulitis and volume overload. PMH includes CAD s/p stent, DM, CKD, CVA, dCHF, L BKA.    PT Comments    Pt was able to progress OOB to recliner chair via maximove and max/total A +2 for rolling. Very limited assist from pt secondary to pain and swelling in BIL hands and fatigue. Continue to feel SNF placement would be appropriate for pt, however if family insists on taking pt home the current equipment reccomendations remain appropriate.    Follow Up Recommendations  SNF;Supervision/Assistance - 24 hour     Equipment Recommendations  Hospital bed;Other (comment)(hoyer lift, amputee lift pad)    Recommendations for Other Services       Precautions / Restrictions Precautions Precautions: Fall;Other (comment) Precaution Comments: RLE unna boot (painful LE), L BKA at baseline Restrictions Weight Bearing Restrictions: No    Mobility  Bed Mobility Overal bed mobility: Needs Assistance Bed Mobility: Rolling Rolling: Total assist;Max assist         General bed mobility comments: multimodal cues for sequencing; unable to grasp hands or reach for rails secondary to significant BIL hand pain  Transfers Overall transfer level: Needs assistance               General transfer comment: Used MAximove to get pt to chair. Pt positioned in chair well.   Ambulation/Gait                 Stairs             Wheelchair Mobility    Modified Rankin (Stroke Patients Only)       Balance Overall balance assessment: Needs assistance   Sitting balance-Leahy Scale: Zero                                      Cognition Arousal/Alertness: Awake/alert Behavior During Therapy: Flat affect Overall Cognitive Status: No family/caregiver present to  determine baseline cognitive functioning                                 General Comments: very soft spoken and mostly mouthing words; pt follows single step cues inconsistently and with increased time      Exercises      General Comments General comments (skin integrity, edema, etc.): Pt's BIL hands swollen and tender to the touch      Pertinent Vitals/Pain Pain Assessment: Faces Faces Pain Scale: Hurts little more Pain Location: R LE with mobility, BIL hands tender to touch Pain Descriptors / Indicators: Grimacing;Sore;Sharp Pain Intervention(s): Monitored during session;Limited activity within patient's tolerance;Repositioned    Home Living                      Prior Function            PT Goals (current goals can now be found in the care plan section) Acute Rehab PT Goals PT Goal Formulation: Patient unable to participate in goal setting Time For Goal Achievement: 02/24/20 Potential to Achieve Goals: Fair Progress towards PT goals: Not progressing toward goals - comment(reliant on lift and limited participation from pt)    Frequency    Min 2X/week  PT Plan Current plan remains appropriate    Co-evaluation              AM-PAC PT "6 Clicks" Mobility   Outcome Measure  Help needed turning from your back to your side while in a flat bed without using bedrails?: Total Help needed moving from lying on your back to sitting on the side of a flat bed without using bedrails?: Total Help needed moving to and from a bed to a chair (including a wheelchair)?: Total Help needed standing up from a chair using your arms (e.g., wheelchair or bedside chair)?: Total Help needed to walk in hospital room?: Total Help needed climbing 3-5 steps with a railing? : Total 6 Click Score: 6    End of Session Equipment Utilized During Treatment: Oxygen Activity Tolerance: Patient tolerated treatment well Patient left: with call bell/phone within  reach;in chair;with chair alarm set Nurse Communication: Mobility status;Need for lift equipment PT Visit Diagnosis: Difficulty in walking, not elsewhere classified (R26.2);Muscle weakness (generalized) (M62.81)     Time: 0165-5374 PT Time Calculation (min) (ACUTE ONLY): 22 min  Charges:  $Therapeutic Activity: 8-22 mins                    Benjiman Core, [place] Pager [phone] Acute Rehab  Allena Katz 02/24/2020, 12:19 PM

## 2020-02-24 NOTE — Progress Notes (Signed)
Inpatient Diabetes Program Recommendations  AACE/ADA: New Consensus Statement on Inpatient Glycemic Control (2015)  Target Ranges:  Prepandial:   less than 140 mg/dL      Peak postprandial:   less than 180 mg/dL (1-2 hours)      Critically ill patients:  140 - 180 mg/dL   Lab Results  Component Value Date   GLUCAP 169 (H) 02/24/2020   HGBA1C 6.0 (H) 02/09/2020    Review of Glycemic Control  Results for [name], [name] (MRN [id number]) as of 02/24/2020 11:51  Ref. Range 02/22/2020 11:38 02/22/2020 16:34 02/22/2020 21:14 02/23/2020 06:01  Glucose-Capillary Latest Ref Range: 70 - 99 mg/dL 230 (H) 208 (H) 265 (H) 229 (H)    Results for [name], [name] (MRN [id number]) as of 02/24/2020 11:51  Ref. Range 02/23/2020 11:24 02/23/2020 16:32 02/23/2020 21:04 02/24/2020 05:59 02/24/2020 11:24  Glucose-Capillary Latest Ref Range: 70 - 99 mg/dL 95 214 (H) 263 (H) 216 (H) 169 (H)    Diabetes history: DM2 Outpatient Diabetes medications: Humalog 0-9 TID  Current orders for Inpatient glycemic control: Novolog 0-6 TID + Levemir 10 daily  Inpatient Diabetes Program Recommendations:     Noted fasting CBG's and post pranials elevated.  Please consider,  -Levemir 15 units daily (0.15/kg) -may consider small dose of meal coverage 2-3 units TID IF eats at least 50% of meals  Thank you, Reche Dixon, RN, BSN Diabetes Coordinator Inpatient Diabetes Program [phone] (team pager from 8a-5p)

## 2020-02-24 NOTE — Progress Notes (Signed)
PROGRESS NOTE    [name]  HYQ:[id number] DOB: February 20, 1951 DOA: 02/09/2020 PCP: Katherina Mires, MD   Brief Narrative:  69 year old female with history of DM-2, CAD s/p BMS to RCA, CVA, thrombocytopenia, diastolic CHF, CKD-5 with LUE aVF placed in 07/2019, PAD s/p left BKA presenting with progressive redness and edema of right lower extremity, and admitted for right lower extremity cellulitis, fluid overload and acute on chronic diastolic CHF.  Patient was started on IV vancomycin for cellulitis.    Developed AKI therefore nephrology team consulted initially started on Lasix and gentle hydration.  Renal function failed to improve therefore started on hemodialysis on 3/27.  Right IJ tunnel catheter placed 02/20/2020.  Nutrition has been a big issue to have her gain her strength.  Nutrition team following, calorie count ongoing.   Assessment & Plan:   Principal Problem:   Acute on chronic diastolic CHF (congestive heart failure) (HCC) Active Problems:   Acute renal failure superimposed on stage 4 chronic kidney disease (HCC)   Type 2 diabetes mellitus with renal manifestations (HCC)   CAD S/P PCI TO RCA for Inferior STEMI. BMS   CKD (chronic kidney disease), stage IV (HCC)   Anemia in chronic kidney disease   CVA (cerebral vascular accident) ([place])   AKI (acute kidney injury) ([place])   Cellulitis of right leg   Thrombocytopenia (HCC)   Hyperbilirubinemia   Xerostomia   Advanced care planning/counseling discussion   Full code status   Dry skin   Physical debility   Palliative care by specialist   Goals of care, counseling/discussion   Acute CVA (cerebrovascular accident) ([place])   Pressure injury of skin  Acute kidney injury on CKD stage V Anemia of chronic disease -Getting dialysis via temporary dialysis catheter.  Nephrology social worker spoke with the family regarding outpatient arrangements, until she gets her strength back it will be difficult to get this done. -Right IJ tunneled  catheter placed 02/20/2020 -Continue dialysis per nephrology team -Received 1 unit PRBC 4/1  Acute on chronic diastolic congestive heart failure, ejection fraction 65% -Volume status maintained with dialysis  Right lower extremity cellulitis, resolved -Improved, completed 7 days of doxycycline, ended 9/52  Acute metabolic encephalopathy Acute/subacute CVA, corona radiata -Combination of uremia/hyperammonia.  No focal neuro deficits.  CT of the head is negative.  TSH, B12 are normal -Lactulose twice daily. -Continue supportive care, avoid sedative medication. -Continue Plavix and statin -Spoke with Dr Cheral Marker on 4/1 who reviewed patient's MRI.  No further input at this time.  Continue current management. -Hemoglobin A1c 6.9, LDL 12  Diabetes mellitus type 2 controlled -Hemoglobin A1c 6.0.  Continue current regimen. -Levemir 10 units daily.  Normocytic anemia Mild folate deficiency Thrombocytopenia -Continue folic acid, insulin sliding scale and Accu-Cheks  History of peripheral arterial disease status post left-sided BKA -Continue Plavix, Lipitor and Zetia  Coronary artery disease status post bare-metal stent to RCA in 2010 -Continue home cardiac meds  Lack of appetite/poor oral intake Mild protein calorie malnutrition -Continue mirtazapine.  Encourage p.o. intake.  Swallow team following.  Recommending dysphagia 2 diet. -Spoke with dietitian this morning-patient is roughly taking about 48% of her caloric needs.  Goals of care discussion-palliative care involved in care.  I have had extensive discussion with patient's daughter regarding long-term goals of care including possibility of feeding tube but in no way this can guarantee actually completely able to recover and get her strength back.  Family really needs to be aware of long-term care involved with  PEG tube  DVT prophylaxis: None secondary to thrombocytopenia Code Status: Full code Family Communication: None at bedside  today Disposition Plan:   Patient From= home  Patient Anticipated D/C place= ongoing discussions regarding her long-term nutrition.  In the meantime clip process  Subjective: Seen and examined at bedside, does not have any complaints.  When I asked her to grab my hand and set up on the board she was unable to do so.  She still requires significant amount of assistance.  Review of Systems Otherwise negative except as per HPI, including: General: Denies fever, chills, night sweats or unintended weight loss. Resp: Denies cough, wheezing, shortness of breath. Cardiac: Denies chest pain, palpitations, orthopnea, paroxysmal nocturnal dyspnea. GI: Denies abdominal pain, nausea, vomiting, diarrhea or constipation GU: Denies dysuria, frequency, hesitancy or incontinence MS: Denies muscle aches, joint pain or swelling Neuro: Denies headache, neurologic deficits (focal weakness, numbness, tingling), abnormal gait Psych: Denies anxiety, depression, SI/HI/AVH Skin: Denies new rashes or lesions ID: Denies sick contacts, exotic exposures, travel  Examination: Constitutional: Chronically ill and frail appearing Respiratory: Clear to auscultation bilaterally Cardiovascular: Normal sinus rhythm, no rubs Abdomen: Nontender nondistended good bowel sounds Musculoskeletal: No edema noted, left-sided BKA Skin: Right lower extremity bandage in place without evidence of infection Neurologic: CN 2-12 grossly intact.  And nonfocal Psychiatric: Alert to name and place.  Poor judgment and insight  Right IJ tunnel catheter noted  Objective: Vitals:   02/23/20 1121 02/23/20 1945 02/24/20 0317 02/24/20 0338  BP: (!) 138/50 (!) 113/44 (!) 141/58   Pulse: 64 68 75   Resp: 16 18 18    Temp: 97.7 F (36.5 C) 97.8 F (36.6 C) 98.6 F (37 C)   TempSrc: Oral Oral Oral   SpO2: 100% 100% 96%   Weight:    103 kg  Height:        Intake/Output Summary (Last 24 hours) at 02/24/2020 1206 Last data filed at 02/24/2020  0834 Gross per 24 hour  Intake 957 ml  Output 5 ml  Net 952 ml   Filed Weights   02/23/20 0700 02/23/20 1014 02/24/20 0338  Weight: 121 kg 113 kg 103 kg     Data Reviewed:   CBC: Recent Labs  Lab 02/19/20 0410 02/20/20 0500 02/22/20 0528 02/23/20 0410 02/24/20 0407  WBC 10.5 9.3 8.9 8.0 7.1  HGB 8.0* 7.7* 8.2* 8.1* 8.0*  HCT 26.3* 25.9* 27.6* 27.3* 26.9*  MCV 100.4* 100.4* 101.1* 100.0 101.1*  PLT 79* 81* 84* 79* 74*   Basic Metabolic Panel: Recent Labs  Lab 02/19/20 0410 02/20/20 0500 02/22/20 0528 02/23/20 0410 02/24/20 0407  NA 138 139 140 137 139  K 3.4* 3.6 4.8 5.1 4.4  CL 100 99 103 100 100  CO2 28 27 25 26 27   GLUCOSE 151* 215* 240* 230* 245*  BUN 16 33* 36* 52* 34*  CREATININE 2.46* 4.03* 4.09* 4.86* 3.52*  CALCIUM 8.4* 8.4* 8.8* 8.8* 8.4*  MG 1.6* 1.7 1.8 1.9 1.8   GFR: Estimated Creatinine Clearance: 17.2 mL/min (A) (by C-G formula based on SCr of 3.52 mg/dL (H)). Liver Function Tests: Recent Labs  Lab 02/18/20 0511 02/19/20 0410  AST 12* 11*  ALT 18 14  ALKPHOS 95 99  BILITOT 1.2 1.3*  PROT 5.4* 5.7*  ALBUMIN 1.9* 2.0*   No results for input(s): LIPASE, AMYLASE in the last 168 hours. No results for input(s): AMMONIA in the last 168 hours. Coagulation Profile: No results for input(s): INR, PROTIME in the last 168 hours.  Cardiac Enzymes: No results for input(s): CKTOTAL, CKMB, CKMBINDEX, TROPONINI in the last 168 hours. BNP (last 3 results) No results for input(s): PROBNP in the last 8760 hours. HbA1C: No results for input(s): HGBA1C in the last 72 hours. CBG: Recent Labs  Lab 02/23/20 1124 02/23/20 1632 02/23/20 2104 02/24/20 0559 02/24/20 1124  GLUCAP 95 214* 263* 216* 169*   Lipid Profile: No results for input(s): CHOL, HDL, LDLCALC, TRIG, CHOLHDL, LDLDIRECT in the last 72 hours. Thyroid Function Tests: No results for input(s): TSH, T4TOTAL, FREET4, T3FREE, THYROIDAB in the last 72 hours. Anemia Panel: No results for  input(s): VITAMINB12, FOLATE, FERRITIN, TIBC, IRON, RETICCTPCT in the last 72 hours. Sepsis Labs: No results for input(s): PROCALCITON, LATICACIDVEN in the last 168 hours.  No results found for this or any previous visit (from the past 240 hour(s)).       Radiology Studies: No results found.      Scheduled Meds: . sodium chloride   Intravenous Once  . allopurinol  50 mg Oral Daily  . antiseptic oral rinse  15 mL Mouth Rinse BID  . atorvastatin  80 mg Oral q1800  . buPROPion  100 mg Oral TID  . carvedilol  25 mg Oral Q12H  . Chlorhexidine Gluconate Cloth  6 each Topical Daily  . Chlorhexidine Gluconate Cloth  6 each Topical Q0600  . clopidogrel  75 mg Oral Daily  . darbepoetin (ARANESP) injection - NON-DIALYSIS  60 mcg Subcutaneous Q Thu-1800  . darbepoetin (ARANESP) injection - NON-DIALYSIS  60 mcg Subcutaneous Q Wed-1800  . feeding supplement (NEPRO CARB STEADY)  237 mL Oral BID BM  . feeding supplement (PRO-STAT SUGAR FREE 64)  30 mL Oral TID with meals  . folic acid  1 mg Oral Daily  . insulin aspart  0-6 Units Subcutaneous TID WC  . insulin detemir  10 Units Subcutaneous Daily  . lactulose  20 g Oral BID  . mirtazapine  7.5 mg Oral QHS  . multivitamin  1 tablet Oral QHS  . omega-3 acid ethyl esters  1 g Oral BID  . sodium chloride flush  3 mL Intravenous Q12H   Continuous Infusions: . sodium chloride Stopped (02/10/20 1409)  . ferumoxytol       LOS: 14 days   Time spent= 35 mins    Alphonzo Devera Arsenio Loader, MD [facility]  If 7PM-7AM, please contact night-coverage  02/24/2020, 12:06 PM   PROGRESS NOTE    [name]  YSA:[id number] DOB: December 24, 1950 DOA: 02/09/2020 PCP: Katherina Mires, MD   Brief Narrative:  69 year old female with history of DM-2, CAD s/p BMS to RCA, CVA, thrombocytopenia, diastolic CHF, CKD-5 with LUE aVF placed in 07/2019, PAD s/p left BKA presenting with progressive redness and edema of right lower extremity, and admitted for right  lower extremity cellulitis, fluid overload and acute on chronic diastolic CHF.  Patient was started on IV vancomycin for cellulitis.    Developed AKI therefore nephrology team consulted initially started on Lasix and gentle hydration.  Renal function failed to improve therefore started on hemodialysis on 3/27.  Right IJ tunnel catheter placed 02/20/2020.   Assessment & Plan:   Principal Problem:   Acute on chronic diastolic CHF (congestive heart failure) (HCC) Active Problems:   Acute renal failure superimposed on stage 4 chronic kidney disease (HCC)   Type 2 diabetes mellitus with renal manifestations (HCC)   CAD S/P PCI TO RCA for Inferior STEMI. BMS   CKD (chronic kidney disease), stage IV ([place])  Anemia in chronic kidney disease   CVA (cerebral vascular accident) ([place])   AKI (acute kidney injury) ([place])   Cellulitis of right leg   Thrombocytopenia (HCC)   Hyperbilirubinemia   Xerostomia   Advanced care planning/counseling discussion   Full code status   Dry skin   Physical debility   Palliative care by specialist   Goals of care, counseling/discussion   Acute CVA (cerebrovascular accident) ([place])   Pressure injury of skin  Acute kidney injury on CKD stage V Anemia of chronic disease -Getting dialysis via temporary dialysis catheter.  Nephrology social worker spoke with the family regarding outpatient arrangements, until she gets her strength back it will be difficult to get this done. -Right IJ tunneled catheter placed 02/20/2020 -Hemodialysis per nephrology team -BUN and creatinine improving. -Status post 1 unit PRBC 4/1.  Acute on chronic diastolic congestive heart failure, ejection fraction 65% -Volume status is stable for now.  Stable with dialysis  Right lower extremity cellulitis, resolved -Improved, completed 7 days of doxycycline, ended 6/83  Acute metabolic encephalopathy Acute/subacute CVA, corona radiata -Combination of uremia/hyperammonia.  No focal neuro  deficits.  CT of the head is negative.  TSH, B12 are normal -Lactulose twice daily -Continue supportive care, avoid sedative medication. -Continue Plavix and statin -Spoke with Dr Cheral Marker on 4/1 who reviewed patient's MRI.  No further input at this time.  Continue current management. -Hemoglobin A1c 6.9, LDL 12  Diabetes mellitus type 2 controlled -Hemoglobin A1c 6.0.  Continue current regimen.  Normocytic anemia Mild folate deficiency -Continue folic acid, insulin sliding scale and Accu-Cheks  History of peripheral arterial disease status post left-sided BKA -Continue Plavix, Lipitor and Zetia  Coronary artery disease status post bare-metal stent to RCA in 2010 -Continue home cardiac meds  Lack of appetite/poor oral intake Mild protein calorie malnutrition -Continue mirtazapine.  Encourage p.o. intake.  Dietitian/speech and swallow working with her.MBS planned today  Goals of care discussion-palliative care team following    DVT prophylaxis: None secondary to thrombocytopenia Code Status: Full code Family Communication: None today Disposition Plan:   Patient From= home  Patient Anticipated D/C place= once cleared by nephrology, her main issue is being strong enough to get seat for outpatient hemodialysis.  Complicated disposition, family aware.  Subjective: No complaints this morning.  She is resting but easily arousable.  Review of Systems Otherwise negative except as per HPI, including: General = no fevers, chills, dizziness,  fatigue HEENT/EYES = negative for loss of vision, double vision, blurred vision,  sore throa Cardiovascular= negative for chest pain, palpitation Respiratory/lungs= negative for shortness of breath, cough, wheezing; hemoptysis,  Gastrointestinal= negative for nausea, vomiting, abdominal pain Genitourinary= negative for Dysuria MSK = Negative for arthralgia, myalgias Neurology= Negative for headache, numbness, tingling  Psychiatry= Negative for  suicidal and homocidal ideation Skin= Negative for Rash   Examination:  Constitutional: Generally weak and chronically ill-appearing.  Not in acute distress at this time. Respiratory: Clear to auscultation bilaterally Cardiovascular: Normal sinus rhythm, no rubs Abdomen: Nontender nondistended good bowel sounds Musculoskeletal: No edema noted Skin: No rashes seen Neurologic: CN 2-12 grossly intact.  And nonfocal Psychiatric: Normal judgment and insight. Alert and oriented x 3. Normal mood.    Right IJ tunnel catheter noted  Objective: Vitals:   02/23/20 1121 02/23/20 1945 02/24/20 0317 02/24/20 0338  BP: (!) 138/50 (!) 113/44 (!) 141/58   Pulse: 64 68 75   Resp: 16 18 18    Temp: 97.7 F (36.5 C) 97.8 F (36.6 C)  98.6 F (37 C)   TempSrc: Oral Oral Oral   SpO2: 100% 100% 96%   Weight:    103 kg  Height:        Intake/Output Summary (Last 24 hours) at 02/24/2020 1206 Last data filed at 02/24/2020 0834 Gross per 24 hour  Intake 957 ml  Output 5 ml  Net 952 ml   Filed Weights   02/23/20 0700 02/23/20 1014 02/24/20 0338  Weight: 121 kg 113 kg 103 kg     Data Reviewed:   CBC: Recent Labs  Lab 02/19/20 0410 02/20/20 0500 02/22/20 0528 02/23/20 0410 02/24/20 0407  WBC 10.5 9.3 8.9 8.0 7.1  HGB 8.0* 7.7* 8.2* 8.1* 8.0*  HCT 26.3* 25.9* 27.6* 27.3* 26.9*  MCV 100.4* 100.4* 101.1* 100.0 101.1*  PLT 79* 81* 84* 79* 74*   Basic Metabolic Panel: Recent Labs  Lab 02/19/20 0410 02/20/20 0500 02/22/20 0528 02/23/20 0410 02/24/20 0407  NA 138 139 140 137 139  K 3.4* 3.6 4.8 5.1 4.4  CL 100 99 103 100 100  CO2 28 27 25 26 27   GLUCOSE 151* 215* 240* 230* 245*  BUN 16 33* 36* 52* 34*  CREATININE 2.46* 4.03* 4.09* 4.86* 3.52*  CALCIUM 8.4* 8.4* 8.8* 8.8* 8.4*  MG 1.6* 1.7 1.8 1.9 1.8   GFR: Estimated Creatinine Clearance: 17.2 mL/min (A) (by C-G formula based on SCr of 3.52 mg/dL (H)). Liver Function Tests: Recent Labs  Lab 02/18/20 0511 02/19/20 0410    AST 12* 11*  ALT 18 14  ALKPHOS 95 99  BILITOT 1.2 1.3*  PROT 5.4* 5.7*  ALBUMIN 1.9* 2.0*   No results for input(s): LIPASE, AMYLASE in the last 168 hours. No results for input(s): AMMONIA in the last 168 hours. Coagulation Profile: No results for input(s): INR, PROTIME in the last 168 hours. Cardiac Enzymes: No results for input(s): CKTOTAL, CKMB, CKMBINDEX, TROPONINI in the last 168 hours. BNP (last 3 results) No results for input(s): PROBNP in the last 8760 hours. HbA1C: No results for input(s): HGBA1C in the last 72 hours. CBG: Recent Labs  Lab 02/23/20 1124 02/23/20 1632 02/23/20 2104 02/24/20 0559 02/24/20 1124  GLUCAP 95 214* 263* 216* 169*   Lipid Profile: No results for input(s): CHOL, HDL, LDLCALC, TRIG, CHOLHDL, LDLDIRECT in the last 72 hours. Thyroid Function Tests: No results for input(s): TSH, T4TOTAL, FREET4, T3FREE, THYROIDAB in the last 72 hours. Anemia Panel: No results for input(s): VITAMINB12, FOLATE, FERRITIN, TIBC, IRON, RETICCTPCT in the last 72 hours. Sepsis Labs: No results for input(s): PROCALCITON, LATICACIDVEN in the last 168 hours.  No results found for this or any previous visit (from the past 240 hour(s)).       Radiology Studies: No results found.      Scheduled Meds: . sodium chloride   Intravenous Once  . allopurinol  50 mg Oral Daily  . antiseptic oral rinse  15 mL Mouth Rinse BID  . atorvastatin  80 mg Oral q1800  . buPROPion  100 mg Oral TID  . carvedilol  25 mg Oral Q12H  . Chlorhexidine Gluconate Cloth  6 each Topical Daily  . Chlorhexidine Gluconate Cloth  6 each Topical Q0600  . clopidogrel  75 mg Oral Daily  . darbepoetin (ARANESP) injection - NON-DIALYSIS  60 mcg Subcutaneous Q Thu-1800  . darbepoetin (ARANESP) injection - NON-DIALYSIS  60 mcg Subcutaneous Q Wed-1800  . feeding supplement (NEPRO CARB STEADY)  237 mL Oral BID BM  . feeding supplement (PRO-STAT SUGAR FREE  64)  30 mL Oral TID with meals  . folic  acid  1 mg Oral Daily  . insulin aspart  0-6 Units Subcutaneous TID WC  . insulin detemir  10 Units Subcutaneous Daily  . lactulose  20 g Oral BID  . mirtazapine  7.5 mg Oral QHS  . multivitamin  1 tablet Oral QHS  . omega-3 acid ethyl esters  1 g Oral BID  . sodium chloride flush  3 mL Intravenous Q12H   Continuous Infusions: . sodium chloride Stopped (02/10/20 1409)  . ferumoxytol       LOS: 14 days   Time spent= 35 mins    Jodeen Mclin Arsenio Loader, MD [facility]  If 7PM-7AM, please contact night-coverage  02/24/2020, 12:06 PM   PROGRESS NOTE    [name]  TIW:[id number] DOB: 1951-05-25 DOA: 02/09/2020 PCP: Katherina Mires, MD   Brief Narrative:  69 year old female with history of DM-2, CAD s/p BMS to RCA, CVA, thrombocytopenia, diastolic CHF, CKD-5 with LUE aVF placed in 07/2019, PAD s/p left BKA presenting with progressive redness and edema of right lower extremity, and admitted for right lower extremity cellulitis, fluid overload and acute on chronic diastolic CHF.  Patient was started on IV vancomycin for cellulitis.    Developed AKI therefore nephrology team consulted initially started on Lasix and gentle hydration.  Renal function failed to improve therefore started on hemodialysis on 3/27.  Right IJ tunnel catheter placed 02/20/2020.   Assessment & Plan:   Principal Problem:   Acute on chronic diastolic CHF (congestive heart failure) (HCC) Active Problems:   Acute renal failure superimposed on stage 4 chronic kidney disease (HCC)   Type 2 diabetes mellitus with renal manifestations (HCC)   CAD S/P PCI TO RCA for Inferior STEMI. BMS   CKD (chronic kidney disease), stage IV (HCC)   Anemia in chronic kidney disease   CVA (cerebral vascular accident) ([place])   AKI (acute kidney injury) ([place])   Cellulitis of right leg   Thrombocytopenia (HCC)   Hyperbilirubinemia   Xerostomia   Advanced care planning/counseling discussion   Full code status   Dry skin   Physical  debility   Palliative care by specialist   Goals of care, counseling/discussion   Acute CVA (cerebrovascular accident) ([place])   Pressure injury of skin  Acute kidney injury on CKD stage V Anemia of chronic disease -Getting dialysis via temporary dialysis catheter.  Nephrology social worker spoke with the family regarding outpatient arrangements, until she gets her strength back it will be difficult to get this done. -Right IJ tunneled catheter placed 02/20/2020 -Hemodialysis per nephrology team -BUN and creatinine improving. -Status post 1 unit PRBC 4/1.  Acute on chronic diastolic congestive heart failure, ejection fraction 65% -Volume status is stable for now.  Stable with dialysis  Right lower extremity cellulitis, resolved -Improved, completed 7 days of doxycycline, ended 2/50  Acute metabolic encephalopathy Acute/subacute CVA, corona radiata -Combination of uremia/hyperammonia.  No focal neuro deficits.  CT of the head is negative.  TSH, B12 are normal -Lactulose twice daily -Continue supportive care, avoid sedative medication. -Continue Plavix and statin -Spoke with Dr Cheral Marker on 4/1 who reviewed patient's MRI.  No further input at this time.  Continue current management. -Hemoglobin A1c 6.9, LDL 12  Diabetes mellitus type 2 controlled -Hemoglobin A1c 6.0.  Continue current regimen.  Normocytic anemia Mild folate deficiency -Continue folic acid, insulin sliding scale and Accu-Cheks  History of peripheral arterial disease status post left-sided BKA -Continue Plavix,  Lipitor and Zetia  Coronary artery disease status post bare-metal stent to RCA in 2010 -Continue home cardiac meds  Lack of appetite/poor oral intake Mild protein calorie malnutrition -Continue mirtazapine.  Encourage p.o. intake.  Dietitian/speech and swallow working with her.MBS planned today  Goals of care discussion-palliative care team following    DVT prophylaxis: None secondary to  thrombocytopenia Code Status: Full code Family Communication: None today Disposition Plan:   Patient From= home  Patient Anticipated D/C place= once cleared by nephrology, her main issue is being strong enough to get seat for outpatient hemodialysis.  Complicated disposition, family aware.  Subjective: No complaints this morning.  She is resting but easily arousable.  Review of Systems Otherwise negative except as per HPI, including: General = no fevers, chills, dizziness,  fatigue HEENT/EYES = negative for loss of vision, double vision, blurred vision,  sore throa Cardiovascular= negative for chest pain, palpitation Respiratory/lungs= negative for shortness of breath, cough, wheezing; hemoptysis,  Gastrointestinal= negative for nausea, vomiting, abdominal pain Genitourinary= negative for Dysuria MSK = Negative for arthralgia, myalgias Neurology= Negative for headache, numbness, tingling  Psychiatry= Negative for suicidal and homocidal ideation Skin= Negative for Rash   Examination:  Constitutional: Generally weak and chronically ill-appearing.  Not in acute distress at this time. Respiratory: Clear to auscultation bilaterally Cardiovascular: Normal sinus rhythm, no rubs Abdomen: Nontender nondistended good bowel sounds Musculoskeletal: No edema noted Skin: No rashes seen Neurologic: CN 2-12 grossly intact.  And nonfocal Psychiatric: Normal judgment and insight. Alert and oriented x 3. Normal mood.    Right IJ tunnel catheter noted  Objective: Vitals:   02/23/20 1121 02/23/20 1945 02/24/20 0317 02/24/20 0338  BP: (!) 138/50 (!) 113/44 (!) 141/58   Pulse: 64 68 75   Resp: 16 18 18    Temp: 97.7 F (36.5 C) 97.8 F (36.6 C) 98.6 F (37 C)   TempSrc: Oral Oral Oral   SpO2: 100% 100% 96%   Weight:    103 kg  Height:        Intake/Output Summary (Last 24 hours) at 02/24/2020 1206 Last data filed at 02/24/2020 0834 Gross per 24 hour  Intake 957 ml  Output 5 ml  Net 952  ml   Filed Weights   02/23/20 0700 02/23/20 1014 02/24/20 0338  Weight: 121 kg 113 kg 103 kg     Data Reviewed:   CBC: Recent Labs  Lab 02/19/20 0410 02/20/20 0500 02/22/20 0528 02/23/20 0410 02/24/20 0407  WBC 10.5 9.3 8.9 8.0 7.1  HGB 8.0* 7.7* 8.2* 8.1* 8.0*  HCT 26.3* 25.9* 27.6* 27.3* 26.9*  MCV 100.4* 100.4* 101.1* 100.0 101.1*  PLT 79* 81* 84* 79* 74*   Basic Metabolic Panel: Recent Labs  Lab 02/19/20 0410 02/20/20 0500 02/22/20 0528 02/23/20 0410 02/24/20 0407  NA 138 139 140 137 139  K 3.4* 3.6 4.8 5.1 4.4  CL 100 99 103 100 100  CO2 28 27 25 26 27   GLUCOSE 151* 215* 240* 230* 245*  BUN 16 33* 36* 52* 34*  CREATININE 2.46* 4.03* 4.09* 4.86* 3.52*  CALCIUM 8.4* 8.4* 8.8* 8.8* 8.4*  MG 1.6* 1.7 1.8 1.9 1.8   GFR: Estimated Creatinine Clearance: 17.2 mL/min (A) (by C-G formula based on SCr of 3.52 mg/dL (H)). Liver Function Tests: Recent Labs  Lab 02/18/20 0511 02/19/20 0410  AST 12* 11*  ALT 18 14  ALKPHOS 95 99  BILITOT 1.2 1.3*  PROT 5.4* 5.7*  ALBUMIN 1.9* 2.0*   No results for input(s):  LIPASE, AMYLASE in the last 168 hours. No results for input(s): AMMONIA in the last 168 hours. Coagulation Profile: No results for input(s): INR, PROTIME in the last 168 hours. Cardiac Enzymes: No results for input(s): CKTOTAL, CKMB, CKMBINDEX, TROPONINI in the last 168 hours. BNP (last 3 results) No results for input(s): PROBNP in the last 8760 hours. HbA1C: No results for input(s): HGBA1C in the last 72 hours. CBG: Recent Labs  Lab 02/23/20 1124 02/23/20 1632 02/23/20 2104 02/24/20 0559 02/24/20 1124  GLUCAP 95 214* 263* 216* 169*   Lipid Profile: No results for input(s): CHOL, HDL, LDLCALC, TRIG, CHOLHDL, LDLDIRECT in the last 72 hours. Thyroid Function Tests: No results for input(s): TSH, T4TOTAL, FREET4, T3FREE, THYROIDAB in the last 72 hours. Anemia Panel: No results for input(s): VITAMINB12, FOLATE, FERRITIN, TIBC, IRON, RETICCTPCT in  the last 72 hours. Sepsis Labs: No results for input(s): PROCALCITON, LATICACIDVEN in the last 168 hours.  No results found for this or any previous visit (from the past 240 hour(s)).       Radiology Studies: No results found.      Scheduled Meds: . sodium chloride   Intravenous Once  . allopurinol  50 mg Oral Daily  . antiseptic oral rinse  15 mL Mouth Rinse BID  . atorvastatin  80 mg Oral q1800  . buPROPion  100 mg Oral TID  . carvedilol  25 mg Oral Q12H  . Chlorhexidine Gluconate Cloth  6 each Topical Daily  . Chlorhexidine Gluconate Cloth  6 each Topical Q0600  . clopidogrel  75 mg Oral Daily  . darbepoetin (ARANESP) injection - NON-DIALYSIS  60 mcg Subcutaneous Q Thu-1800  . darbepoetin (ARANESP) injection - NON-DIALYSIS  60 mcg Subcutaneous Q Wed-1800  . feeding supplement (NEPRO CARB STEADY)  237 mL Oral BID BM  . feeding supplement (PRO-STAT SUGAR FREE 64)  30 mL Oral TID with meals  . folic acid  1 mg Oral Daily  . insulin aspart  0-6 Units Subcutaneous TID WC  . insulin detemir  10 Units Subcutaneous Daily  . lactulose  20 g Oral BID  . mirtazapine  7.5 mg Oral QHS  . multivitamin  1 tablet Oral QHS  . omega-3 acid ethyl esters  1 g Oral BID  . sodium chloride flush  3 mL Intravenous Q12H   Continuous Infusions: . sodium chloride Stopped (02/10/20 1409)  . ferumoxytol       LOS: 14 days   Time spent= 35 mins    Nyree Applegate Arsenio Loader, MD [facility]  If 7PM-7AM, please contact night-coverage  02/24/2020, 12:06 PM

## 2020-02-24 NOTE — Progress Notes (Signed)
[facility] Progress Note  Subjective: seen in room, wants to go home   Vitals:   02/23/20 1121 02/23/20 1945 02/24/20 0317 02/24/20 0338  BP: (!) 138/50 (!) 113/44 (!) 141/58   Pulse: 64 68 75   Resp: 16 18 18    Temp: 97.7 F (36.5 C) 97.8 F (36.6 C) 98.6 F (37 C)   TempSrc: Oral Oral Oral   SpO2: 100% 100% 96%   Weight:    103 kg  Height:        Exam: Gen: Awake  nondistressed, answering questions w/o much difficulty, poor memory CVS: Pulse regularrhythm, normal rate, S1 and S2 normal. Right IJ hemodialysis catheter Resp: clear bilat w/o rales or wheezing Abd: Soft, flat, nontender Ext: Right leg inAce wrap,trace edema left ankle. + right upper arm AVF +TDC Neuro: Ox 2, nonfocal   Dialysis: new start   Assessment/ Plan 1.  ESRD/ new start: Lethargy seems to be now improving, possibly delayed response to initiation of dialysis.  New start to HD. Has RUA AVF. seen by [facility], very strong thrill so they would like not to give on it it, will need OP fistulogram to determine if usable and then superficialization if is usable. Using [facility] for now, placed 4/3. TTS schedule here. Next HD 4/8. Will need CLIP at some point but not ready for dc due to debility and not eating.   2. Right lower extremity cellulitis: Clinically improved Unna boot.  3.  HTN/ volume: on coreg 25 bid, BP's 140/80, no signs of vol excess on exam, wt's down here. UF 1.5- 2.5L on average.  4. Anemia ckd: with iron deficiency tsat 8% here, feraheme ordered but never given will consult pharm. Got darbe 60 ug on 3/25 then missed last week, will restart 60 ug SQ today then weekly.  Status post transfusion 02/18/2020. Hb 8-9 range.  5. Secondary hyperparathyroidism:Calcium and phosphorus levels currently at goal, poor oral intake and not on binders 6.  Debility/ dispo - agree w/ primary that pt will need to get her strength back before she can be dc'd for OP dialysis.     [name] 02/24/2020,  11:51 AM   Recent Labs  Lab 02/23/20 0410 02/24/20 0407  K 5.1 4.4  BUN 52* 34*  CREATININE 4.86* 3.52*  CALCIUM 8.8* 8.4*  HGB 8.1* 8.0*   Inpatient medications: . sodium chloride   Intravenous Once  . allopurinol  50 mg Oral Daily  . antiseptic oral rinse  15 mL Mouth Rinse BID  . atorvastatin  80 mg Oral q1800  . buPROPion  100 mg Oral TID  . carvedilol  25 mg Oral Q12H  . Chlorhexidine Gluconate Cloth  6 each Topical Daily  . Chlorhexidine Gluconate Cloth  6 each Topical Q0600  . clopidogrel  75 mg Oral Daily  . darbepoetin (ARANESP) injection - NON-DIALYSIS  60 mcg Subcutaneous Q Thu-1800  . feeding supplement (NEPRO CARB STEADY)  237 mL Oral BID BM  . feeding supplement (PRO-STAT SUGAR FREE 64)  30 mL Oral TID with meals  . folic acid  1 mg Oral Daily  . insulin aspart  0-6 Units Subcutaneous TID WC  . insulin detemir  10 Units Subcutaneous Daily  . lactulose  20 g Oral BID  . mirtazapine  7.5 mg Oral QHS  . multivitamin  1 tablet Oral QHS  . omega-3 acid ethyl esters  1 g Oral BID  . sodium chloride flush  3 mL Intravenous Q12H   .  sodium chloride Stopped (02/10/20 1409)  . ferumoxytol     sodium chloride, acetaminophen **OR** acetaminophen, heparin, ondansetron **OR** ondansetron (ZOFRAN) IV, Resource ThickenUp Clear, sodium chloride flush

## 2020-02-24 NOTE — Progress Notes (Signed)
Calorie Count Note  48 hour calorie count ordered.  Diet:dysphagia 2 with thin liquids Supplements:Nepro Shake poBID, each supplement provides 425 kcal and 19 grams protein;30 ml Prostat TID, each supplement provides 100 kcal and 15 grams of protein; renal MVI daily  Case discussed with RN and MD. Intake remains inconsistent. Pt is often very selective about her care and what she eats. Noted pt consumed less than 50% of needs yesterday. Pt is also very debilitated, with increased care needs and will require to take HD in a chair at discharge. Pt also requires feeding assistance; pt would require an attentive caregiver who would encourage PO intake and give extra time, as pt eats very slowly.   Per discussion with MD, he has discussed possibility of PEG placement yet, however, family is still discussing this possibility. Supplemental enteral feeding would be pt's best bet to help improve nutritional status.     Most Recent Value  Orbital Region  Moderate depletion  Upper Arm Region  Mild depletion  Thoracic and Lumbar Region  No depletion  Buccal Region  Mild depletion  Temple Region  Mild depletion  Clavicle Bone Region  No depletion  Clavicle and Acromion Bone Region  No depletion  Scapular Bone Region  No depletion  Dorsal Hand  Mild depletion  Patellar Region  Moderate depletion  Anterior Thigh Region  Moderate depletion  Posterior Calf Region  Moderate depletion  Edema (RD Assessment)  Mild  Hair  Reviewed  Eyes  Reviewed  Mouth  Reviewed  Skin  Reviewed  Nails  Reviewed      4/5 Breakfast:16 kcals, 0 grams protein Lunch: refused (0 kcals, 0 grams protein) Dinner: 295 kcals, 6 grams protein Supplements:1 Nepro supplement (425 kcals, 19 grams protein)  Total intake: 736 kcal (41% of minimum estimated needs)  25 grams protein (23% of minimum estimated needs)  4/6 Breakfast: 107 kcals, 0 grams protein Lunch: 170 kcals, 3 grams protein Dinner: 181 kcals, 6 grams  protein Supplements: 1 Prostat, 1 Nepro (525 kcals, 34 grams protein)  Total intake: 983 kcal (55% of minimum estimated needs)  43 grams protein (39% of minimum estimated needs)  Total intake: 860 kcal (48% of minimum estimated needs)  34 grams protein (30% of minimum estimated needs)  Nutrition Dx:Moderate malnutrition related to chronic illness (CHF, ESRD on HD) as evidenced by energy intake <75% of estimated energy intake x 1 month; mild to moderate fat and muscle depleton; ongoing  Goal:Patient will meet greater than or equal to 90% of their needs; Unmet  Intervention:   -D/c calorie count -Continue renal MVI daily -Continue Nepro Shake poBID, each supplement provides 425 kcal and 19 grams protein -Continue 30 ml Prostat TID with meals, each supplement provides 100 kcals and 15 grams protein -Consider initiation of nutrition support. If family amenable:  Initiate Osmolite 1.5 @ 10 ml/hr via and increase by 10 ml every 8 hours to goal rate of 40 ml/hr.   30 ml Prostat BID.    Tube feeding regimen provides 1640 kcal (91% of needs), 90 grams of protein, and 732 ml of H2O.   Loistine Chance, RD, LDN, [place] Registered Dietitian II Certified Diabetes Care and Education Specialist Please refer to [facility] for RD and/or RD on-call/weekend/after hours pager

## 2020-02-24 NOTE — Progress Notes (Addendum)
PALLIATIVE NOTE:  Chart reviewed. Patient resting in bed. Easily aroused with verbal stimuli. Denied pain or shortness of breath. No family at the bedside. Discussed nutrition. Patient reports she did not eat much breakfast stating "it didn't taste good!" Educated patient on the importance of appetite and oral nutrition improvement in order to gain strength. She verbalized understanding. Patient stating she "just want to go home!" Support given.   Detailed discussion with patient's daughter, Rogelio Seen via phone. Discussed goals of care for patient. Family continues to request for full code/full scope care. Shared with family concerns for patient's limited nutrition with reports of intake of less than 50%. Daughter shares they were previously told intake was about 50-75%. I educated family on inconsistency of nutritional intake with consideration of expressed goals of full scope care. We discussed long-term outlook and worries of patient possibly not meeting nutritional requirements, limited ability to tolerate outpatient HD, and continued weakness. Daughter verbalized understanding.   I educated family at length on best case and worst case scenario including aggressive treatments such as artificial feeding/PEG placement in the setting patient not maintaining nutritional needs and goals remain for full scope care. Education provided on benefits and the risk of artificial feedings (dislodgement, infection, nausea, vomiting, diarrhea, aspiration, and maintenance). Daughter express they remain hopeful patient's appetite would improve. Rogelio Seen shares family is attempting to be at the bedside for meals and offer assistance, in addition to bringing and ordering foods patient would generally eat. Family is planning to further discuss goals amongst themselves with focus of discussion her patient's nutrition. I offered to support family with organized conference call and answer any further questions. Daughter plans to  call if other siblings would like to have conference call. Support given.   Assessment -chronically ill appearing, obese -RRR -normal breathing efforts -left upper arm bruising -awake and alert  Plan -Continue full code/full scope care as requested by family -Detailed discussion with daughter regarding concerns for patient's overall poor nutrition (less than 50% daily) and intolerance to outpatient HD. Education provided regarding artificial feedings/PEG in the setting of full scope care. Family plans to discuss amongst themselves and follow back up with questions and/or decisions.  -Family expressed goals if for patient to return home. They remain hopeful for improvement.    Time Total: 50 min.   Visit consisted of counseling and education dealing with the complex and emotionally intense issues of symptom management and palliative care in the setting of serious and potentially life-threatening illness.Greater than 50%  of this time was spent counseling and coordinating care related to the above assessment and plan.  Alda Lea, AGPCNP-BC  Palliative Medicine Team [phone]

## 2020-02-25 DIAGNOSIS — Z66 Do not resuscitate: Secondary | ICD-10-CM

## 2020-02-25 DIAGNOSIS — I639 Cerebral infarction, unspecified: Secondary | ICD-10-CM

## 2020-02-25 DIAGNOSIS — E440 Moderate protein-calorie malnutrition: Secondary | ICD-10-CM | POA: Insufficient documentation

## 2020-02-25 LAB — GLUCOSE, CAPILLARY
Glucose-Capillary: 185 mg/dL — ABNORMAL HIGH (ref 70–99)
Glucose-Capillary: 198 mg/dL — ABNORMAL HIGH (ref 70–99)
Glucose-Capillary: 191 mg/dL — ABNORMAL HIGH (ref 70–99)
Glucose-Capillary: 153 mg/dL — ABNORMAL HIGH (ref 70–99)

## 2020-02-25 LAB — BASIC METABOLIC PANEL
Anion gap: 13 (ref 5–15)
BUN: 62 mg/dL — ABNORMAL HIGH (ref 8–23)
CO2: 26 mmol/L (ref 22–32)
Calcium: 8.8 mg/dL — ABNORMAL LOW (ref 8.9–10.3)
Chloride: 100 mmol/L (ref 98–111)
Creatinine, Ser: 5.01 mg/dL — ABNORMAL HIGH (ref 0.44–1.00)
GFR calc Af Amer: 10 mL/min — ABNORMAL LOW (ref >60)
GFR calc non Af Amer: 8 mL/min — ABNORMAL LOW (ref >60)
Glucose, Bld: 196 mg/dL — ABNORMAL HIGH (ref 70–99)
Potassium: 5.2 mmol/L — ABNORMAL HIGH (ref 3.5–5.1)
Sodium: 139 mmol/L (ref 135–145)

## 2020-02-25 LAB — CBC
HCT: 26.6 % — ABNORMAL LOW (ref 36.0–46.0)
Hemoglobin: 7.9 g/dL — ABNORMAL LOW (ref 12.0–15.0)
MCH: 29.8 pg (ref 26.0–34.0)
MCHC: 29.7 g/dL — ABNORMAL LOW (ref 30.0–36.0)
MCV: 100.4 fL — ABNORMAL HIGH (ref 80.0–100.0)
Platelets: 76 10*3/uL — ABNORMAL LOW (ref 150–400)
RBC: 2.65 MIL/uL — ABNORMAL LOW (ref 3.87–5.11)
RDW: 16 % — ABNORMAL HIGH (ref 11.5–15.5)
WBC: 7.4 10*3/uL (ref 4.0–10.5)
nRBC: 0 % (ref 0.0–0.2)

## 2020-02-25 LAB — MAGNESIUM: Magnesium: 1.9 mg/dL (ref 1.7–2.4)

## 2020-02-25 MED ORDER — CHLORHEXIDINE GLUCONATE CLOTH 2 % EX PADS
6.0000 | MEDICATED_PAD | Freq: Every day | CUTANEOUS | Status: DC
  Administered 2020-02-26: 6 via TOPICAL

## 2020-02-25 NOTE — Progress Notes (Addendum)
[facility] Progress Note  Subjective: seen in room  Vitals:   02/24/20 2049 02/25/20 0352 02/25/20 0354 02/25/20 1117  BP: (!) 150/56 (!) 152/66  (!) 145/65  Pulse: 72 76  61  Resp: 17 20  18   Temp: 97.7 F (36.5 C) 98.2 F (36.8 C)  97.8 F (36.6 C)  TempSrc: Oral Oral  Oral  SpO2: 99% 97%  100%  Weight:   107 kg   Height:        Exam: Gen: Awake  nondistressed, answering questions w/o much difficulty, poor memory, frail , difficult to communicate with CVS: Pulse regularrhythm, normal rate, S1 and S2 normal. Right IJ TDC Resp: clear bilat w/o rales or wheezing Abd: Soft, flat, nontender Ext: Right leg inAce wrap,trace edema left ankle. + right upper arm AVF +TDC Neuro: Ox 2, nonfocal   Dialysis: new start   Assessment/ Plan 1.  ESRD/ new start: Lethargy seems to be now improving, possibly delayed response to initiation of dialysis.  New start to HD. Has RUA AVF. seen by [facility], very strong thrill so they would like not to give on it it, will need OP fistulogram to determine if usable and then superficialization if is usable. Using [facility] for now, placed 4/3. Will switch to MWF sched here due to staffing/ high pt census. HD tomorrow.  Will need CLIP at some point but not ready due to debility and not eating.   2. Right lower extremity cellulitis: Clinically improved Unna boot.  3.  HTN/ volume: on coreg 25 bid, BP's 140/80, no signs of vol excess on exam, wt's down here. UF 1.5- 2.5L on average.  4. Anemia ckd: with iron deficiency tsat 8% here, feraheme ordered but never given will consult pharm. Got darbe 60 ug on 3/25 then missed last week, will restart 60 ug SQ today then weekly.  Status post transfusion 02/18/2020. Hb 8-9 range.  5. Secondary hyperparathyroidism:Calcium and phosphorus levels currently at goal, poor oral intake and not on binders 6.  Debility/ dispo - agree w/ primary that pt will need to get her strength back before she can be dc'd for OP  dialysis.     Rob Ector [place] 02/25/2020, 4:50 PM   Recent Labs  Lab 02/24/20 0407 02/25/20 0430  K 4.4 5.2*  BUN 34* 62*  CREATININE 3.52* 5.01*  CALCIUM 8.4* 8.8*  HGB 8.0* 7.9*   Inpatient medications: . sodium chloride   Intravenous Once  . allopurinol  50 mg Oral Daily  . antiseptic oral rinse  15 mL Mouth Rinse BID  . atorvastatin  80 mg Oral q1800  . buPROPion  100 mg Oral TID  . carvedilol  25 mg Oral Q12H  . Chlorhexidine Gluconate Cloth  6 each Topical Daily  . Chlorhexidine Gluconate Cloth  6 each Topical Q0600  . Chlorhexidine Gluconate Cloth  6 each Topical Q0600  . clopidogrel  75 mg Oral Daily  . darbepoetin (ARANESP) injection - NON-DIALYSIS  60 mcg Subcutaneous Q Thu-1800  . feeding supplement (NEPRO CARB STEADY)  237 mL Oral BID BM  . feeding supplement (PRO-STAT SUGAR FREE 64)  30 mL Oral TID with meals  . folic acid  1 mg Oral Daily  . insulin aspart  0-6 Units Subcutaneous TID WC  . insulin detemir  10 Units Subcutaneous Daily  . lactulose  20 g Oral BID  . mirtazapine  7.5 mg Oral QHS  . multivitamin  1 tablet Oral QHS  . omega-3 acid ethyl esters  1 g Oral BID  . sodium chloride flush  3 mL Intravenous Q12H   . sodium chloride Stopped (02/10/20 1409)   sodium chloride, acetaminophen **OR** acetaminophen, heparin, ondansetron **OR** ondansetron (ZOFRAN) IV, Resource ThickenUp Clear, sodium chloride flush

## 2020-02-25 NOTE — Plan of Care (Signed)
  Problem: Education: Goal: Ability to demonstrate management of disease process will improve Outcome: Progressing   Problem: Education: Goal: Ability to verbalize understanding of medication therapies will improve Outcome: Progressing   Problem: Activity: Goal: Capacity to carry out activities will improve Outcome: Progressing   Problem: Cardiac: Goal: Ability to achieve and maintain adequate cardiopulmonary perfusion will improve Outcome: Progressing   

## 2020-02-25 NOTE — Progress Notes (Signed)
PROGRESS NOTE    [name]  VOJ:[id number] DOB: 1951/09/25 DOA: 02/09/2020 PCP: Katherina Mires, MD    Subjective: The patient was seen and examined this morning, awake alert, but lethargic, not participating in exam, but following commands. Complaining of her right hand and arm pain.  Per nursing staff patient has severe-history of stroke and weakness on the same side. She continues to be severely debilitated, unable to sit up. P.o. intake is a sporadic per nursing staff, when encouraged with the nursing staff she does take p.o.  Otherwise she does not have any interest   Both daughters were present at bedside, later they were updated by phone, encouraged to establish POA, goals of care and [place] to the family that patient might not be a good candidate for PEG tube placement.    --------------------------------------------------------------------------------------------------------------------------------  Brief Narrative:  69 year old female with history of DM-2, CAD s/p BMS to RCA, CVA, thrombocytopenia, diastolic CHF, CKD-5 with LUE aVF placed in 07/2019, PAD s/p left BKA presenting with progressive redness and edema of right lower extremity, and admitted for right lower extremity cellulitis, fluid overload and acute on chronic diastolic CHF.  Patient was started on IV vancomycin for cellulitis.    Developed AKI therefore nephrology team consulted initially started on Lasix and gentle hydration.  Renal function failed to improve therefore started on hemodialysis on 3/27.  Right IJ tunnel catheter placed 02/20/2020.  Nutrition has been a big issue to have her gain her strength.  Nutrition team following, calorie count ongoing.  --------------------------------------------------------------------------------------------------------------------------------  Assessment & Plan:   Principal Problem:   Acute on chronic diastolic CHF (congestive heart failure) (HCC) Active  Problems:   Acute renal failure superimposed on stage 4 chronic kidney disease (HCC)   Type 2 diabetes mellitus with renal manifestations (HCC)   CAD S/P PCI TO RCA for Inferior STEMI. BMS   CKD (chronic kidney disease), stage IV (HCC)   Anemia in chronic kidney disease   CVA (cerebral vascular accident) ([place])   AKI (acute kidney injury) ([place])   Cellulitis of right leg   Thrombocytopenia (HCC)   Hyperbilirubinemia   Xerostomia   Advanced care planning/counseling discussion   Full code status   Dry skin   Physical debility   Palliative care by specialist   Goals of care, counseling/discussion   Acute CVA (cerebrovascular accident) ([place])   Pressure injury of skin   Malnutrition of moderate degree  Acute kidney injury on CKD stage V/ -Started on dialysis on this admission -Getting dialysis via temporary dialysis catheter. -  Nephrology social worker spoke with the family regarding outpatient arrangements, until she gets her strength back it will be difficult to get this done.  -Right IJ tunneled catheter placed 02/20/2020 -Continue dialysis per nephrology team  Anemia of chronic disease/ Normocytic anemia/iron/folate deficiency Thrombocytopenia -Continue folic acid,  -Received 1 unit PRBC 4/1 -H&H remains low, hemoglobin this morning 7.9/26.6 Monitoring H&H closely  Acute on chronic diastolic congestive heart failure, ejection fraction 65% -Volume status maintained with dialysis -Trend daily weight  Right lower extremity cellulitis, resolved -Improved, completed 7 days of doxycycline, ended 9/93   Acute metabolic encephalopathy Acute/subacute CVA, corona radiata -Combination of uremia/hyperammonia.  No focal neuro deficits.  CT of the head is negative.  TSH, B12 are normal -Lactulose twice daily. -Continue supportive care, avoid sedative medication. -Continue Plavix and statin -Spoke with Dr Cheral Marker on 4/1 who reviewed patient's MRI.  No further input at this time.   Continue current management. -  Hemoglobin A1c 6.9, LDL 12  Diabetes mellitus type 2 controlled -Hemoglobin A1c 6.0.   -CBG QA CHS, with SSI -Levemir 10 units daily.   History of peripheral arterial disease status post left-sided BKA -Continue Plavix, Lipitor and Zetia  Coronary artery disease status post bare-metal stent to RCA in 2010 -Continue home cardiac meds  Lack of appetite/poor oral intake Mild protein calorie malnutrition -Continue mirtazapine.  Encourage p.o. intake.  Swallow team following.   Recommending dysphagia 2 diet. -Spoke with dietitian this morning-patient is roughly taking about 48% of her caloric needs.  Goals of care discussion Palliative care has been consulted, appreciate their assistance determining goals of care and CODE STATUS. -Daughters were updated, goal of care was discussed, including patient continued decline, debility, Severe generalized weakness, unable to sit for operation dialysis. Patient family was made aware of the long-term care involving poor nutrition, encephalopathy, end-stage renal disease, and multiple comorbidities including a poor prognosis.  Daughter instructed family meeting to determine if POA, CODE STATUS and goals of care. Appreciate palliative care team following with him.   DVT prophylaxis: None secondary to thrombocytopenia Code Status: Full code Family Communication: None at bedside today Disposition Plan:   Patient From= home  Patient Anticipated D/C place= ongoing discussions regarding her long-term nutrition.  In the meantime clip process  Objective: Vitals:   02/24/20 2049 02/25/20 0352 02/25/20 0354 02/25/20 1117  BP: (!) 150/56 (!) 152/66  (!) 145/65  Pulse: 72 76  61  Resp: 17 20  18   Temp: 97.7 F (36.5 C) 98.2 F (36.8 C)  97.8 F (36.6 C)  TempSrc: Oral Oral  Oral  SpO2: 99% 97%  100%  Weight:   107 kg   Height:        Intake/Output Summary (Last 24 hours) at 02/25/2020 1259 Last data filed at  02/25/2020 8299 Gross per 24 hour  Intake 560 ml  Output 0 ml  Net 560 ml   Filed Weights   02/23/20 1014 02/24/20 0338 02/25/20 0354  Weight: 113 kg 103 kg 107 kg   Physical Exam  Constitution:  Alert, awake, lethargic, complaining of right arm, and pain Psychiatric: Normal and stable mood and affect, cognition intact,   HEENT: Normocephalic, PERRL, otherwise with in Normal limits  Chest:Chest symmetric Cardio vascular:  S1/S2, RRR, No murmure, No Rubs or Gallops  pulmonary: Clear to auscultation bilaterally, respirations unlabored, negative wheezes / crackles Abdomen: Soft, non-tender, non-distended, bowel sounds,no masses, no organomegaly Muscular skeletal: Limited exam - in bed, able to move all 4 extremities, Normal strength,  Neuro: CNII-XII intact. , normal motor and sensation, reflexes intact  Extremities:  Right lower extremity BKA, no pitting edema lower extremities, +2 pulses  Skin: Dry, warm to touch, negative for any Rashes, No open wounds Wounds: per nursing documentation Pressure Injury 02/22/20 Sacrum Right Stage 2 -  Partial thickness loss of dermis presenting as a shallow open injury with a red, pink wound bed without slough. pink (Active)  02/22/20 1511  Location: Sacrum  Location Orientation: Right  Staging: Stage 2 -  Partial thickness loss of dermis presenting as a shallow open injury with a red, pink wound bed without slough.  Wound Description (Comments): pink  Present on Admission: Yes     Pressure Injury 02/22/20 Heel Right Stage 1 -  Intact skin with non-blanchable redness of a localized area usually over a bony prominence. (Active)  02/22/20 2200  Location: Heel  Location Orientation: Right  Staging: Stage 1 -  Intact skin with non-blanchable redness of a localized area usually over a bony prominence.  Wound Description (Comments):   Present on Admission:      Pressure Injury 02/22/20 Coccyx Mid Stage 2 -  Partial thickness loss of dermis presenting as  a shallow open injury with a red, pink wound bed without slough. pink (Active)  02/22/20 1212  Location: Coccyx  Location Orientation: Mid  Staging: Stage 2 -  Partial thickness loss of dermis presenting as a shallow open injury with a red, pink wound bed without slough.  Wound Description (Comments): pink  Present on Admission:       Right IJ tunnel catheter noted   Data Reviewed:   CBC: Recent Labs  Lab 02/20/20 0500 02/22/20 0528 02/23/20 0410 02/24/20 0407 02/25/20 0430  WBC 9.3 8.9 8.0 7.1 7.4  HGB 7.7* 8.2* 8.1* 8.0* 7.9*  HCT 25.9* 27.6* 27.3* 26.9* 26.6*  MCV 100.4* 101.1* 100.0 101.1* 100.4*  PLT 81* 84* 79* 74* 76*   Basic Metabolic Panel: Recent Labs  Lab 02/20/20 0500 02/22/20 0528 02/23/20 0410 02/24/20 0407 02/25/20 0430  NA 139 140 137 139 139  K 3.6 4.8 5.1 4.4 5.2*  CL 99 103 100 100 100  CO2 27 25 26 27 26   GLUCOSE 215* 240* 230* 245* 196*  BUN 33* 36* 52* 34* 62*  CREATININE 4.03* 4.09* 4.86* 3.52* 5.01*  CALCIUM 8.4* 8.8* 8.8* 8.4* 8.8*  MG 1.7 1.8 1.9 1.8 1.9   GFR: Estimated Creatinine Clearance: 12.4 mL/min (A) (by C-G formula based on SCr of 5.01 mg/dL (H)). Liver Function Tests: Recent Labs  Lab 02/19/20 0410  AST 11*  ALT 14  ALKPHOS 99  BILITOT 1.3*  PROT 5.7*  ALBUMIN 2.0*   No results for input(s): LIPASE, AMYLASE in the last 168 hours. No results for input(s): AMMONIA in the last 168 hours. Coagulation Profile: No results for input(s): INR, PROTIME in the last 168 hours. Cardiac Enzymes: No results for input(s): CKTOTAL, CKMB, CKMBINDEX, TROPONINI in the last 168 hours. BNP (last 3 results) No results for input(s): PROBNP in the last 8760 hours. HbA1C: No results for input(s): HGBA1C in the last 72 hours. CBG: Recent Labs  Lab 02/24/20 1124 02/24/20 1656 02/24/20 2052 02/25/20 0613 02/25/20 1108  GLUCAP 169* 193* 168* 185* 198*   Lipid Profile: No results for input(s): CHOL, HDL, LDLCALC, TRIG, CHOLHDL,  LDLDIRECT in the last 72 hours. Thyroid Function Tests: No results for input(s): TSH, T4TOTAL, FREET4, T3FREE, THYROIDAB in the last 72 hours. Anemia Panel: No results for input(s): VITAMINB12, FOLATE, FERRITIN, TIBC, IRON, RETICCTPCT in the last 72 hours. Sepsis Labs: No results for input(s): PROCALCITON, LATICACIDVEN in the last 168 hours.  No results found for this or any previous visit (from the past 240 hour(s)).       Radiology Studies: No results found.      Scheduled Meds: . sodium chloride   Intravenous Once  . allopurinol  50 mg Oral Daily  . antiseptic oral rinse  15 mL Mouth Rinse BID  . atorvastatin  80 mg Oral q1800  . buPROPion  100 mg Oral TID  . carvedilol  25 mg Oral Q12H  . Chlorhexidine Gluconate Cloth  6 each Topical Daily  . Chlorhexidine Gluconate Cloth  6 each Topical Q0600  . Chlorhexidine Gluconate Cloth  6 each Topical Q0600  . clopidogrel  75 mg Oral Daily  . darbepoetin (ARANESP) injection - NON-DIALYSIS  60 mcg Subcutaneous Q Thu-1800  . feeding  supplement (NEPRO CARB STEADY)  237 mL Oral BID BM  . feeding supplement (PRO-STAT SUGAR FREE 64)  30 mL Oral TID with meals  . folic acid  1 mg Oral Daily  . insulin aspart  0-6 Units Subcutaneous TID WC  . insulin detemir  10 Units Subcutaneous Daily  . lactulose  20 g Oral BID  . mirtazapine  7.5 mg Oral QHS  . multivitamin  1 tablet Oral QHS  . omega-3 acid ethyl esters  1 g Oral BID  . sodium chloride flush  3 mL Intravenous Q12H   Continuous Infusions: . sodium chloride Stopped (02/10/20 1409)     LOS: 15 days   Time spent= 35 mins    Deatra James, MD [facility]  If 7PM-7AM, please contact night-coverage  02/25/2020, 12:59 PM   PROGRESS NOTE    [name]  HAL:[id number] DOB: [birth year] DOA: 02/09/2020 PCP: Katherina Mires, MD   Subjective: The patient was seen and examined this morning, lethargic, but follows command, complaining of right arm hand pain.  Per  nursing staff patient eats what she wants to, with nursing staff assistance and encouragement she eats her meal almost fully Otherwise motivated and eating       Brief Narrative:  69 year old female with history of DM-2, CAD s/p BMS to RCA, CVA, thrombocytopenia, diastolic CHF, CKD-5 with LUE aVF placed in 07/2019, PAD s/p left BKA presenting with progressive redness and edema of right lower extremity, and admitted for right lower extremity cellulitis, fluid overload and acute on chronic diastolic CHF.  Patient was started on IV vancomycin for cellulitis.    Developed AKI therefore nephrology team consulted initially started on Lasix and gentle hydration.  Renal function failed to improve therefore started on hemodialysis on 3/27.  Right IJ tunnel catheter placed 02/20/2020.   Assessment & Plan:   Principal Problem:   Acute on chronic diastolic CHF (congestive heart failure) (HCC) Active Problems:   Acute renal failure superimposed on stage 4 chronic kidney disease (HCC)   Type 2 diabetes mellitus with renal manifestations (HCC)   CAD S/P PCI TO RCA for Inferior STEMI. BMS   CKD (chronic kidney disease), stage IV (HCC)   Anemia in chronic kidney disease   CVA (cerebral vascular accident) ([place])   AKI (acute kidney injury) ([place])   Cellulitis of right leg   Thrombocytopenia (HCC)   Hyperbilirubinemia   Xerostomia   Advanced care planning/counseling discussion   Full code status   Dry skin   Physical debility   Palliative care by specialist   Goals of care, counseling/discussion   Acute CVA (cerebrovascular accident) ([place])   Pressure injury of skin   Malnutrition of moderate degree  Acute kidney injury on CKD stage V Anemia of chronic disease -Getting dialysis via temporary dialysis catheter.  Nephrology social worker spoke with the family regarding outpatient arrangements, until she gets her strength back it will be difficult to get this done. -Right IJ tunneled catheter placed  02/20/2020 -Hemodialysis per nephrology team -BUN and creatinine improving. -Status post 1 unit PRBC 4/1.  Acute on chronic diastolic congestive heart failure, ejection fraction 65% -Volume status is stable for now.  Stable with dialysis  Right lower extremity cellulitis, resolved -Improved, completed 7 days of doxycycline, ended 7/35  Acute metabolic encephalopathy Acute/subacute CVA, corona radiata -Combination of uremia/hyperammonia.  No focal neuro deficits.  CT of the head is negative.  TSH, B12 are normal -Lactulose twice daily -Continue supportive care, avoid sedative medication. -  Continue Plavix and statin -Spoke with Dr Cheral Marker on 4/1 who reviewed patient's MRI.  No further input at this time.  Continue current management. -Hemoglobin A1c 6.9, LDL 12  Diabetes mellitus type 2 controlled -Hemoglobin A1c 6.0.  Continue current regimen.  Normocytic anemia Mild folate deficiency -Continue folic acid, insulin sliding scale and Accu-Cheks  History of peripheral arterial disease status post left-sided BKA -Continue Plavix, Lipitor and Zetia  Coronary artery disease status post bare-metal stent to RCA in 2010 -Continue home cardiac meds  Lack of appetite/poor oral intake Mild protein calorie malnutrition -Continue mirtazapine.  Encourage p.o. intake.  Dietitian/speech and swallow working with her.MBS planned today  Goals of care discussion-palliative care team following    DVT prophylaxis: None secondary to thrombocytopenia Code Status: Full code Family Communication: None today Disposition Plan:   Patient From= home  Patient Anticipated D/C place= once cleared by nephrology, her main issue is being strong enough to get seat for outpatient hemodialysis.  Complicated disposition, family aware.  Subjective: No complaints this morning.  She is resting but easily arousable.  Review of Systems Otherwise negative except as per HPI, including: General = no fevers, chills,  dizziness,  fatigue HEENT/EYES = negative for loss of vision, double vision, blurred vision,  sore throa Cardiovascular= negative for chest pain, palpitation Respiratory/lungs= negative for shortness of breath, cough, wheezing; hemoptysis,  Gastrointestinal= negative for nausea, vomiting, abdominal pain Genitourinary= negative for Dysuria MSK = Negative for arthralgia, myalgias Neurology= Negative for headache, numbness, tingling  Psychiatry= Negative for suicidal and homocidal ideation Skin= Negative for Rash   Examination:  Constitutional: Generally weak and chronically ill-appearing.  Not in acute distress at this time. Respiratory: Clear to auscultation bilaterally Cardiovascular: Normal sinus rhythm, no rubs Abdomen: Nontender nondistended good bowel sounds Musculoskeletal: No edema noted Skin: No rashes seen Neurologic: CN 2-12 grossly intact.  And nonfocal Psychiatric: Normal judgment and insight. Alert and oriented x 3. Normal mood.    Right IJ tunnel catheter noted  Objective: Vitals:   02/24/20 2049 02/25/20 0352 02/25/20 0354 02/25/20 1117  BP: (!) 150/56 (!) 152/66  (!) 145/65  Pulse: 72 76  61  Resp: 17 20  18   Temp: 97.7 F (36.5 C) 98.2 F (36.8 C)  97.8 F (36.6 C)  TempSrc: Oral Oral  Oral  SpO2: 99% 97%  100%  Weight:   107 kg   Height:        Intake/Output Summary (Last 24 hours) at 02/25/2020 1259 Last data filed at 02/25/2020 0826 Gross per 24 hour  Intake 560 ml  Output 0 ml  Net 560 ml   Filed Weights   02/23/20 1014 02/24/20 0338 02/25/20 0354  Weight: 113 kg 103 kg 107 kg     Data Reviewed:   CBC: Recent Labs  Lab 02/20/20 0500 02/22/20 0528 02/23/20 0410 02/24/20 0407 02/25/20 0430  WBC 9.3 8.9 8.0 7.1 7.4  HGB 7.7* 8.2* 8.1* 8.0* 7.9*  HCT 25.9* 27.6* 27.3* 26.9* 26.6*  MCV 100.4* 101.1* 100.0 101.1* 100.4*  PLT 81* 84* 79* 74* 76*   Basic Metabolic Panel: Recent Labs  Lab 02/20/20 0500 02/22/20 0528 02/23/20 0410  02/24/20 0407 02/25/20 0430  NA 139 140 137 139 139  K 3.6 4.8 5.1 4.4 5.2*  CL 99 103 100 100 100  CO2 27 25 26 27 26   GLUCOSE 215* 240* 230* 245* 196*  BUN 33* 36* 52* 34* 62*  CREATININE 4.03* 4.09* 4.86* 3.52* 5.01*  CALCIUM 8.4* 8.8* 8.8*  8.4* 8.8*  MG 1.7 1.8 1.9 1.8 1.9   GFR: Estimated Creatinine Clearance: 12.4 mL/min (A) (by C-G formula based on SCr of 5.01 mg/dL (H)). Liver Function Tests: Recent Labs  Lab 02/19/20 0410  AST 11*  ALT 14  ALKPHOS 99  BILITOT 1.3*  PROT 5.7*  ALBUMIN 2.0*   Recent Labs  Lab 02/24/20 1124 02/24/20 1656 02/24/20 2052 02/25/20 0613 02/25/20 1108  GLUCAP 169* 193* 168* 185* 198*   Radiology Studies: No results found.      Scheduled Meds: . sodium chloride   Intravenous Once  . allopurinol  50 mg Oral Daily  . antiseptic oral rinse  15 mL Mouth Rinse BID  . atorvastatin  80 mg Oral q1800  . buPROPion  100 mg Oral TID  . carvedilol  25 mg Oral Q12H  . Chlorhexidine Gluconate Cloth  6 each Topical Daily  . Chlorhexidine Gluconate Cloth  6 each Topical Q0600  . Chlorhexidine Gluconate Cloth  6 each Topical Q0600  . clopidogrel  75 mg Oral Daily  . darbepoetin (ARANESP) injection - NON-DIALYSIS  60 mcg Subcutaneous Q Thu-1800  . feeding supplement (NEPRO CARB STEADY)  237 mL Oral BID BM  . feeding supplement (PRO-STAT SUGAR FREE 64)  30 mL Oral TID with meals  . folic acid  1 mg Oral Daily  . insulin aspart  0-6 Units Subcutaneous TID WC  . insulin detemir  10 Units Subcutaneous Daily  . lactulose  20 g Oral BID  . mirtazapine  7.5 mg Oral QHS  . multivitamin  1 tablet Oral QHS  . omega-3 acid ethyl esters  1 g Oral BID  . sodium chloride flush  3 mL Intravenous Q12H   Continuous Infusions: . sodium chloride Stopped (02/10/20 1409)     LOS: 15 days   Time spent= 35 mins    Deatra James, MD [facility]  If 7PM-7AM, please contact night-coverage  02/25/2020, 12:59 PM   PROGRESS  NOTE    [name]  WTU:[id number] DOB: 20-Sep-1951 DOA: 02/09/2020 PCP: Katherina Mires, MD      Subjective: No complaints this morning.  She is resting but easily arousable.    Brief Narrative:  69 year old female with history of DM-2, CAD s/p BMS to RCA, CVA, thrombocytopenia, diastolic CHF, CKD-5 with LUE aVF placed in 07/2019, PAD s/p left BKA presenting with progressive redness and edema of right lower extremity, and admitted for right lower extremity cellulitis, fluid overload and acute on chronic diastolic CHF.  Patient was started on IV vancomycin for cellulitis.    Developed AKI therefore nephrology team consulted initially started on Lasix and gentle hydration.  Renal function failed to improve therefore started on hemodialysis on 3/27.  Right IJ tunnel catheter placed 02/20/2020.   Assessment & Plan:   Principal Problem:   Acute on chronic diastolic CHF (congestive heart failure) (HCC) Active Problems:   Acute renal failure superimposed on stage 4 chronic kidney disease (HCC)   Type 2 diabetes mellitus with renal manifestations (HCC)   CAD S/P PCI TO RCA for Inferior STEMI. BMS   CKD (chronic kidney disease), stage IV (HCC)   Anemia in chronic kidney disease   CVA (cerebral vascular accident) ([place])   AKI (acute kidney injury) ([place])   Cellulitis of right leg   Thrombocytopenia (HCC)   Hyperbilirubinemia   Xerostomia   Advanced care planning/counseling discussion   Full code status   Dry skin   Physical debility   Palliative care  by specialist   Goals of care, counseling/discussion   Acute CVA (cerebrovascular accident) ([place])   Pressure injury of skin   Malnutrition of moderate degree  Acute kidney injury on CKD stage V Anemia of chronic disease -Getting dialysis via temporary dialysis catheter.  Nephrology social worker spoke with the family regarding outpatient arrangements, until she gets her strength back it will be difficult to get this done. -Right IJ  tunneled catheter placed 02/20/2020 -Hemodialysis per nephrology team -BUN and creatinine improving. -Status post 1 unit PRBC 4/1.  Acute on chronic diastolic congestive heart failure, ejection fraction 65% -Volume status is stable for now.  Stable with dialysis  Right lower extremity cellulitis, resolved -Improved, completed 7 days of doxycycline, ended 2/77  Acute metabolic encephalopathy Acute/subacute CVA, corona radiata -Combination of uremia/hyperammonia.  No focal neuro deficits.  CT of the head is negative.  TSH, B12 are normal -Lactulose twice daily -Continue supportive care, avoid sedative medication. -Continue Plavix and statin -Spoke with Dr Cheral Marker on 4/1 who reviewed patient's MRI.  No further input at this time.  Continue current management. -Hemoglobin A1c 6.9, LDL 12  Diabetes mellitus type 2 controlled -Hemoglobin A1c 6.0.  Continue current regimen.  Normocytic anemia Mild folate deficiency -Continue folic acid, insulin sliding scale and Accu-Cheks  History of peripheral arterial disease status post left-sided BKA -Continue Plavix, Lipitor and Zetia  Coronary artery disease status post bare-metal stent to RCA in 2010 -Continue home cardiac meds  Lack of appetite/poor oral intake Mild protein calorie malnutrition -Continue mirtazapine.  Encourage p.o. intake.  Dietitian/speech and swallow working with her.MBS planned today  Goals of care discussion-palliative care team following    DVT prophylaxis: None secondary to thrombocytopenia Code Status: Full code Family Communication: Spoke to both of her daughters discharge and [place] to the family that she is continued to decline despite dialysis and encouraging p.o. intake... She has difficulty sitting up, she needs to at least be able to ambulate her set up for outpatient dialysis Status is to be determined, daughters need to assign POA -Will follow up with family and appreciate palliative  care team following to assist to determine goal of care and CODE STATUS.  Disposition Plan:   Patient From= home  Patient Anticipated D/C place= once cleared by nephrology, her main issue is being strong enough to get seat for outpatient hemodialysis.  Complicated disposition, family aware.   Physical Exam  Constitution: Awake but lethargic, follows some command, complaining of her right arm hand pain. HEENT: Normocephalic, PERRL, otherwise with in Normal limits  Chest:Chest symmetric Cardio vascular:  S1/S2, RRR, No murmure, No Rubs or Gallops  pulmonary: Clear to auscultation bilaterally, respirations unlabored, negative wheezes / crackles Abdomen: Soft, non-tender, non-distended, bowel sounds,no masses, no organomegaly Muscular skeletal: Limited exam - in bed, able to move all 4 extremities, Normal strength,  Neuro: CNII-XII intact. , normal motor and sensation, reflexes intact  Extremities:  Left BKA, no pitting edema lower extremities, +2 pulses  Skin: Dry, warm to touch, negative for any Rashes, No open wounds Wounds: per nursing documentation Pressure Injury 02/22/20 Sacrum Right Stage 2 -  Partial thickness loss of dermis presenting as a shallow open injury with a red, pink wound bed without slough. pink (Active)  02/22/20 1511  Location: [place]  Location Orientation: Right  Staging: Stage 2 -  Partial thickness loss of dermis presenting as a shallow open injury with a red, pink wound bed without slough.  Wound Description (Comments): pink  Present on Admission: Yes     Pressure Injury 02/22/20 Heel Right Stage 1 -  Intact skin with non-blanchable redness of a localized area usually over a bony prominence. (Active)  02/22/20 2200  Location: Heel  Location Orientation: Right  Staging: Stage 1 -  Intact skin with non-blanchable redness of a localized area usually over a bony prominence.  Wound Description (Comments):   Present on Admission:      Pressure Injury 02/22/20 Coccyx  Mid Stage 2 -  Partial thickness loss of dermis presenting as a shallow open injury with a red, pink wound bed without slough. pink (Active)  02/22/20 1212  Location: Coccyx  Location Orientation: Mid  Staging: Stage 2 -  Partial thickness loss of dermis presenting as a shallow open injury with a red, pink wound bed without slough.  Wound Description (Comments): pink  Present on Admission:      Right IJ tunnel catheter noted  Objective: Vitals:   02/24/20 2049 02/25/20 0352 02/25/20 0354 02/25/20 1117  BP: (!) 150/56 (!) 152/66  (!) 145/65  Pulse: 72 76  61  Resp: 17 20  18   Temp: 97.7 F (36.5 C) 98.2 F (36.8 C)  97.8 F (36.6 C)  TempSrc: Oral Oral  Oral  SpO2: 99% 97%  100%  Weight:   107 kg   Height:        Intake/Output Summary (Last 24 hours) at 02/25/2020 1259 Last data filed at 02/25/2020 0826 Gross per 24 hour  Intake 560 ml  Output 0 ml  Net 560 ml   Filed Weights   02/23/20 1014 02/24/20 0338 02/25/20 0354  Weight: 113 kg 103 kg 107 kg     Data Reviewed:   CBC: Recent Labs  Lab 02/20/20 0500 02/22/20 0528 02/23/20 0410 02/24/20 0407 02/25/20 0430  WBC 9.3 8.9 8.0 7.1 7.4  HGB 7.7* 8.2* 8.1* 8.0* 7.9*  HCT 25.9* 27.6* 27.3* 26.9* 26.6*  MCV 100.4* 101.1* 100.0 101.1* 100.4*  PLT 81* 84* 79* 74* 76*   Basic Metabolic Panel: Recent Labs  Lab 02/20/20 0500 02/22/20 0528 02/23/20 0410 02/24/20 0407 02/25/20 0430  NA 139 140 137 139 139  K 3.6 4.8 5.1 4.4 5.2*  CL 99 103 100 100 100  CO2 27 25 26 27 26   GLUCOSE 215* 240* 230* 245* 196*  BUN 33* 36* 52* 34* 62*  CREATININE 4.03* 4.09* 4.86* 3.52* 5.01*  CALCIUM 8.4* 8.8* 8.8* 8.4* 8.8*  MG 1.7 1.8 1.9 1.8 1.9   GFR: Estimated Creatinine Clearance: 12.4 mL/min (A) (by C-G formula based on SCr of 5.01 mg/dL (H)). Liver Function Tests: Recent Labs  Lab 02/19/20 0410  AST 11*  ALT 14  ALKPHOS 99  BILITOT 1.3*  PROT 5.7*  ALBUMIN 2.0*    CBG: Recent Labs  Lab 02/24/20 1124  02/24/20 1656 02/24/20 2052 02/25/20 0613 02/25/20 1108  GLUCAP 169* 193* 168* 185* 198*    Radiology Studies: No results found.      Scheduled Meds: . sodium chloride   Intravenous Once  . allopurinol  50 mg Oral Daily  . antiseptic oral rinse  15 mL Mouth Rinse BID  . atorvastatin  80 mg Oral q1800  . buPROPion  100 mg Oral TID  . carvedilol  25 mg Oral Q12H  . Chlorhexidine Gluconate Cloth  6 each Topical Daily  . Chlorhexidine Gluconate Cloth  6 each Topical Q0600  . Chlorhexidine Gluconate Cloth  6 each Topical Q0600  . clopidogrel  75  mg Oral Daily  . darbepoetin (ARANESP) injection - NON-DIALYSIS  60 mcg Subcutaneous Q Thu-1800  . feeding supplement (NEPRO CARB STEADY)  237 mL Oral BID BM  . feeding supplement (PRO-STAT SUGAR FREE 64)  30 mL Oral TID with meals  . folic acid  1 mg Oral Daily  . insulin aspart  0-6 Units Subcutaneous TID WC  . insulin detemir  10 Units Subcutaneous Daily  . lactulose  20 g Oral BID  . mirtazapine  7.5 mg Oral QHS  . multivitamin  1 tablet Oral QHS  . omega-3 acid ethyl esters  1 g Oral BID  . sodium chloride flush  3 mL Intravenous Q12H   Continuous Infusions: . sodium chloride Stopped (02/10/20 1409)     LOS: 15 days   Time spent= 35 mins    Deatra James, MD [facility]  If 7PM-7AM, please contact night-coverage  02/25/2020, 12:59 PM

## 2020-02-25 NOTE — Progress Notes (Signed)
Daily Progress Note   Patient Name: [name]       Date: 02/25/2020 DOB: 05-22-1951  Age: 69 y.o. MRN#: [id number] Attending Physician: Deatra James, MD Primary Care Physician: Katherina Mires, MD Admit Date: 02/09/2020  Reason for Consultation/Follow-up: Establishing goals of care  Subjective: Patient somnolent but will arouse and answer some simple questions and follow simple commands. Complains of hand pain. Falls back asleep during assessment. No family at bedside.   1355: Patient's daughters [name] and [place]) are at the bedside requesting to have family discussion. Myself, Gregary Signs, NP (PMT), Sheffield Slider met for discussion. Griffin Basil, and family friend, Lelan Pons also was involved in discussion via video chat.   Updates provided to family. We discussed at length patient's poor prognosis, overall functional decline, poor appetite, lethargy, and concerns with patient's inability to tolerate outpatient HD. Detailed education provided regarding poor nutrition and artificial feedings, including risk and benefits. All family members verbalized understanding. Family mutually verbalized their understanding of risk of PEG tube and not adding meaningful recovery to patient's quality of life. Support given.   We discussed at length concerns with patient's increased lethargy and inability to tolerate HD outpatient. Family is aware that patient would not be able to remain hospitalized for HD specifically. Family verbalized understanding.   Tearful discussion amongst family regarding patient's previously expressed wishes. Daughter, [name] shares that her mother has expressed wishes to go home and be with family. Therapeutic listening and support provided.   Created space and  opportunity for family to ask questions and again express their wishes regarding patient's care with awareness of prognosis and overall condition. Detailed discussion regarding patient's full code status with consideration to her current illness and co-morbidities. Family shared they recently discussed with patient and she said she would not want to receive heroic measures. Both Shavonne and [name] verbalized patient expressing this to her. Family mutually agreed that they felt patient would not want to receive heroic measures and would like for her to be a DNR.   Outpatient hospice was discussed and offered in the setting of family's expressed wishes to allow patient to be comfortable and honor her wishes of being home with family. Education provided on in home hospice versus residential hospice their goals and philosophy of care. Family asked appropriate questions regarding prognosis and  symptom management. Discussions regarding symptom management and the support family would receive. Family all agreed patient would not want to be in a facility and they would prefer to bring her home with hospice support. Education provided on what hospice would look like in the home and the discontinuation of aggressive treatments such as re-hospitalizations, labwork, vital signs, HD, with care focusing on comfort and enjoying what time Ms. Parenteau had left. We discussed comfort feeds. Family verbalized understanding and appreciation.   All family mutually verbalized wishes to discharge patient home with family and outpatient hospice support. Family discussed plans for home equipment to be delivered with understanding hospice can assist with equipments needed as well. Family aware I will communicate orders for hospice and equipment needs with their services, with hopes of working out logistics prior to equipment delivery tomorrow. Family has requested to continue with current treatment plan while hospitalized. They would  like patient to receive her scheduled HD treatment today.   All questions answered, support given. Family appreciative of time and discussion.   Length of Stay: 15  Current Medications: Scheduled Meds:   sodium chloride   Intravenous Once   allopurinol  50 mg Oral Daily   antiseptic oral rinse  15 mL Mouth Rinse BID   atorvastatin  80 mg Oral q1800   buPROPion  100 mg Oral TID   carvedilol  25 mg Oral Q12H   Chlorhexidine Gluconate Cloth  6 each Topical Daily   Chlorhexidine Gluconate Cloth  6 each Topical Q0600   Chlorhexidine Gluconate Cloth  6 each Topical Q0600   clopidogrel  75 mg Oral Daily   darbepoetin (ARANESP) injection - NON-DIALYSIS  60 mcg Subcutaneous Q Thu-1800   feeding supplement (NEPRO CARB STEADY)  237 mL Oral BID BM   feeding supplement (PRO-STAT SUGAR FREE 64)  30 mL Oral TID with meals   folic acid  1 mg Oral Daily   insulin aspart  0-6 Units Subcutaneous TID WC   insulin detemir  10 Units Subcutaneous Daily   lactulose  20 g Oral BID   mirtazapine  7.5 mg Oral QHS   multivitamin  1 tablet Oral QHS   omega-3 acid ethyl esters  1 g Oral BID   sodium chloride flush  3 mL Intravenous Q12H    Continuous Infusions:  sodium chloride Stopped (02/10/20 1409)    PRN Meds: sodium chloride, acetaminophen **OR** acetaminophen, heparin, ondansetron **OR** ondansetron (ZOFRAN) IV, Resource ThickenUp Clear, sodium chloride flush  Physical Exam         -lethargic, chronically-ill apearing -RRR -diminished bilaterally   Vital Signs: BP (!) 145/65 (BP Location: Left Arm)    Pulse 61    Temp 97.8 F (36.6 C) (Oral)    Resp 18    Ht '5\' 2"'  (1.575 m)    Wt 107 kg    SpO2 100%    BMI 43.15 kg/m  SpO2: SpO2: 100 % O2 Device: O2 Device: Nasal Cannula O2 Flow Rate: O2 Flow Rate (L/min): 2 L/min  Intake/output summary:   Intake/Output Summary (Last 24 hours) at 02/25/2020 1508 Last data filed at 02/25/2020 0826 Gross per 24 hour  Intake 560 ml    Output 0 ml  Net 560 ml   LBM: Last BM Date: 02/24/20 Baseline Weight: Weight: 95 kg Most recent weight: Weight: 107 kg       Palliative Assessment/Data: PPS 20%     Flowsheet Rows     Most Recent Value  Intake Tab  Referral  Department  Hospitalist  Unit at Time of Referral  Cardiac/Telemetry Unit  Palliative Care Primary Diagnosis  Nephrology  Date Notified  02/11/20  Palliative Care Type  New Palliative care  Reason for referral  Clarify Goals of Care  Date of Admission  02/09/20  Date first seen by Palliative Care  02/14/20  # of days Palliative referral response time  3 Day(s)  # of days IP prior to Palliative referral  2  Clinical Assessment  Psychosocial & Spiritual Assessment  Palliative Care Outcomes      Patient Active Problem List   Diagnosis Date Noted   Malnutrition of moderate degree 02/25/2020   Pressure injury of skin 02/22/2020   Acute CVA (cerebrovascular accident) ([place]) 02/18/2020   Xerostomia    Advanced care planning/counseling discussion    Full code status    Dry skin    Physical debility    Palliative care by specialist    Goals of care, counseling/discussion    Cellulitis of right leg 02/09/2020   Thrombocytopenia ([place]) 02/09/2020   Hyperbilirubinemia 02/09/2020   AKI (acute kidney injury) ([place]) 08/10/2019   Hypoglycemia 08/09/2019   Hyperkalemia 08/09/2019   Lactic acidosis 08/09/2019   Hypothermia 08/09/2019   CVA (cerebral vascular accident) ([place]) 12/19/2016   S/P BKA (below knee amputation) unilateral ([place]) 08/06/2014   Chronic respiratory failure with hypoxia ([place]) 08/06/2014   Essential hypertension 06/23/2014   Acute on chronic diastolic CHF (congestive heart failure) ([place]) 01/12/2014   UTI (lower urinary tract infection) 01/11/2014   Anemia in chronic kidney disease 09/25/2013   Hyponatremia 08/06/2013   CKD (chronic kidney disease), stage IV ([place]) 08/06/2013   Severe obesity (BMI >= 40) ([place])  06/22/2013   Peripheral arterial disease: s/p L BKA; Occluded R Pop A, 2 V runnoff 05/01/2013   Hyperlipidemia LDL goal <70    Hypertension associated with diabetes ([place])    Unspecified constipation 04/16/2013   Type 2 diabetes mellitus with renal manifestations ([place]) 04/16/2013   Secondary renovascular hypertension, benign 04/09/2013   Nausea and vomiting 03/18/2013   Acute renal failure superimposed on stage 4 chronic kidney disease ([place]) 03/18/2013   Anemia of chronic disease 03/18/2013   CVA (cerebral infarction) 03/18/2013   Non-ketotic hyperosmolar coma ([place]) 03/18/2013   CAD S/P PCI TO RCA for Inferior STEMI. BMS 03/31/2009   Status post myocardial infarction of inferior wall 03/31/2009    Palliative Care Assessment & Plan   Recommendations/Plan:  DNR/DNI-as requested and confirm by family  Continue current plan of care per attending. Family would like patient to receive scheduled HD treatment today as planned.   Family expressed wishes for patient to discharge home once all equipment delivered and hospice has been set-up and confirmed. (referral placed)  Patient will need equipment delivered in addition to EMS transport home.   PMT will continue to support and follow.   Goals of Care and Additional Recommendations:  Limitations on Scope of Treatment: No Artificial Feeding and Continue to treat the treatable, no escalation of care, DNR  Code Status:    Code Status Orders  (From admission, onward)         Start     Ordered   02/09/20 0619  Full code  Continuous     02/09/20 0618        Code Status History    Date Active Date Inactive Code Status Order ID Comments User Context   08/09/2019 1602 08/13/2019 1836 Full Code [id number]  Mckinley Jewel, MD ED  12/19/2016 1831 12/20/2016 2234 Full Code [id number]  Maryellen Pile, MD Inpatient   08/06/2014 0929 08/10/2014 1722 Full Code [id number]  Verlee Monte, MD Inpatient   06/23/2014 1816 06/27/2014 1755 Full  Code [id number]  Robbie Lis, MD Inpatient   01/11/2014 1700 01/15/2014 1829 Full Code [id number]  Kinnie Feil, MD Inpatient   08/09/2013 1326 08/14/2013 1637 Full Code [id number]  Marianna Payment, MD Inpatient   08/07/2013 1102 08/09/2013 1326 Full Code [id number]  Marianna Payment, MD Inpatient   08/06/2013 1945 08/07/2013 1102 Full Code [id number]  Sheila Oats, MD Inpatient   05/30/2013 0246 06/10/2013 1657 Full Code [id number]  Derrill Kay, MD Inpatient   03/18/2013 1319 03/23/2013 1905 Full Code [id number]  Robbie Lis, MD Inpatient   Advance Care Planning Activity       Prognosis:   < 6 months (with weeks) in the setting of outpatient hospice and discontinuation of aggressive medical interventions (HD).   Discharge Planning:  Home with Hospice  Care plan was discussed with patients children [name], Nelwyn [place] (family friend), Shirlee Limerick, RN, and Dr. Roger Shelter.   Thank you for allowing the Palliative Medicine Team to assist in the care of this patient.  Time Total: 80 min.   Visit consisted of counseling and education dealing with the complex and emotionally intense issues of symptom management and palliative care in the setting of serious and potentially life-threatening illness.Greater than 50%  of this time was spent counseling and coordinating care related to the above assessment and plan.  Alda Lea, AGPCNP-BC  Palliative Medicine Team [phone]   Please contact Palliative Medicine Team phone at [phone] for questions and concerns.

## 2020-02-26 DIAGNOSIS — N186 End stage renal disease: Secondary | ICD-10-CM

## 2020-02-26 LAB — BASIC METABOLIC PANEL
Anion gap: 14 (ref 5–15)
BUN: 82 mg/dL — ABNORMAL HIGH (ref 8–23)
CO2: 25 mmol/L (ref 22–32)
Calcium: 8.6 mg/dL — ABNORMAL LOW (ref 8.9–10.3)
Chloride: 100 mmol/L (ref 98–111)
Creatinine, Ser: 6.05 mg/dL — ABNORMAL HIGH (ref 0.44–1.00)
GFR calc Af Amer: 8 mL/min — ABNORMAL LOW (ref >60)
GFR calc non Af Amer: 7 mL/min — ABNORMAL LOW (ref >60)
Glucose, Bld: 129 mg/dL — ABNORMAL HIGH (ref 70–99)
Potassium: 5.9 mmol/L — ABNORMAL HIGH (ref 3.5–5.1)
Sodium: 139 mmol/L (ref 135–145)

## 2020-02-26 LAB — GLUCOSE, CAPILLARY
Glucose-Capillary: 106 mg/dL — ABNORMAL HIGH (ref 70–99)
Glucose-Capillary: 61 mg/dL — ABNORMAL LOW (ref 70–99)
Glucose-Capillary: 63 mg/dL — ABNORMAL LOW (ref 70–99)
Glucose-Capillary: 71 mg/dL (ref 70–99)
Glucose-Capillary: 108 mg/dL — ABNORMAL HIGH (ref 70–99)

## 2020-02-26 LAB — MAGNESIUM: Magnesium: 2.2 mg/dL (ref 1.7–2.4)

## 2020-02-26 LAB — CBC
HCT: 25.2 % — ABNORMAL LOW (ref 36.0–46.0)
Hemoglobin: 7.5 g/dL — ABNORMAL LOW (ref 12.0–15.0)
MCH: 29.5 pg (ref 26.0–34.0)
MCHC: 29.8 g/dL — ABNORMAL LOW (ref 30.0–36.0)
MCV: 99.2 fL (ref 80.0–100.0)
Platelets: 75 10*3/uL — ABNORMAL LOW (ref 150–400)
RBC: 2.54 MIL/uL — ABNORMAL LOW (ref 3.87–5.11)
RDW: 15.9 % — ABNORMAL HIGH (ref 11.5–15.5)
WBC: 7.8 10*3/uL (ref 4.0–10.5)
nRBC: 0 % (ref 0.0–0.2)

## 2020-02-26 MED ORDER — LACTULOSE 10 GM/15ML PO SOLN: 20.0000 g | Freq: Two times a day (BID) | ORAL | 0 refills | Status: DC

## 2020-02-26 MED ORDER — ACETAMINOPHEN 325 MG PO TABS: 650.0000 mg | ORAL_TABLET | Freq: Four times a day (QID) | ORAL | Status: DC | PRN

## 2020-02-26 MED ORDER — MIRTAZAPINE 7.5 MG PO TABS: 7.5000 mg | ORAL_TABLET | Freq: Every day | ORAL | 1 refills | Status: DC

## 2020-02-26 MED ORDER — NEPRO/CARBSTEADY PO LIQD: 237.0000 mL | Freq: Two times a day (BID) | ORAL | 0 refills | Status: DC

## 2020-02-26 MED ORDER — ONDANSETRON HCL 4 MG PO TABS: 4.0000 mg | ORAL_TABLET | Freq: Four times a day (QID) | ORAL | 0 refills | Status: DC | PRN

## 2020-02-26 MED ORDER — PRO-STAT SUGAR FREE PO LIQD: 30.0000 mL | Freq: Three times a day (TID) | ORAL | 0 refills | Status: DC

## 2020-02-26 NOTE — Progress Notes (Addendum)
Hypoglycemic Event  CBG: 63  Treatment: Juice and a pack of sugar  Symptoms: NOne  Follow-up CBG: Time: 1745 CBG Result:71  Possible Reasons for Event: Patient refused food intake      [name]

## 2020-02-26 NOTE — Progress Notes (Signed)
PT Cancellation Note  Patient Details Name: [name] MRN: [id number] DOB: 02-11-1951   Cancelled Treatment:    Reason Eval/Treat Not Completed: Patient at procedure or test/unavailable Pt at dialysis and then d/cing home with hospice.  Will hold PT today. Maggie Font, PT Acute Rehab Services Pager [phone] [facility] [phone]   Karlton Lemon 02/26/2020, 1:40 PM

## 2020-02-26 NOTE — Progress Notes (Signed)
Nutrition Follow-up  DOCUMENTATION CODES:   Morbid obesity, Non-severe (moderate) malnutrition in context of chronic illness  INTERVENTION:   -Continue renal MVI daily -Continue Nepro Shake poBID, each supplement provides 425 kcal and 19 grams protein -Continue 30 ml Prostat TID with meals, each supplement provides 100 kcals and 15 grams protein  NUTRITION DIAGNOSIS:   Moderate Malnutrition related to chronic illness(ESRD on HD, CHF) as evidenced by mild fat depletion, moderate fat depletion, mild muscle depletion, moderate muscle depletion, energy intake < or equal to 75% for > or equal to 1 month.  Ongoing  GOAL:   Patient will meet greater than or equal to 90% of their needs  Progressing   MONITOR:   PO intake, Supplement acceptance, Diet advancement, Labs, Weight trends, Skin, I & O's  REASON FOR ASSESSMENT:   Consult Calorie Count  ASSESSMENT:   69 year old female with history of DM-2, CAD s/p BMS to RCA, CVA, thrombocytopenia, diastolic CHF, CKD-5 with LUE aVF placed in 07/2019, PAD s/p left BKA presenting with progressive redness and edema of right lower extremity, and admitted for right lower extremity cellulitis, fluid overload and acute on chronic diastolic CHF.  Patient was started on IV vancomycin for cellulitis.    Developed AKI therefore nephrology team consulted initially started on Lasix and gentle hydration.  Renal function failed to improve therefore started on hemodialysis on 3/27.  Currently has right IJ temporary dialysis catheter in place.  3/26- s/p HD cath placement, HD initiated 3/28- s/p BSE- advanced to dysphagia 1 diet with nectar thick liquids 4/2- s/p MBSS- advanced to dysphagia 2 with thin liquids  Reviewed I/O's: +220 ml x 24 hours and -775 ml since 02/12/20  Pt with remains with poor oral intake; noted documented meal completions 0-50%. Calorie count was completed from 02/22/20-02/24/20; pt was consuming <50% of needs.   Plan to discharge home  today with home hospice. Last HD treatment planned for today.   Labs reviewed: K: 5.9, CBGS: 103-198.   Diet Order:   Diet Order            Diet - low sodium heart healthy        DIET DYS 2 Room service appropriate? Yes; Fluid consistency: Thin  Diet effective now              EDUCATION NEEDS:   Not appropriate for education at this time  Skin:  Skin Assessment: Skin Integrity Issues: Skin Integrity Issues:: Stage II Stage II: coccyx Other: MASD to groin and buttocks  Last BM:  02/17/20  Height:   Ht Readings from Last 1 Encounters:  02/18/20 5\' 2"  (1.575 m)    Weight:   Wt Readings from Last 1 Encounters:  02/26/20 89.9 kg    Ideal Body Weight:  46.8 kg(adjusted for lt BKA)  BMI:  Body mass index is 36.25 kg/m.  Estimated Nutritional Needs:   Kcal:  1800-2000  Protein:  110-125 grams  Fluid:  1000 ml + UOP    Loistine Chance, RD, LDN, [place] Registered Dietitian II Certified Diabetes Care and Education Specialist Please refer to [facility] for RD and/or RD on-call/weekend/after hours pager

## 2020-02-26 NOTE — Progress Notes (Signed)
Pt d/c home via [facility]. Vitals stable, CBG 108, no distress. All belongings sent with pt. Daughter notified.

## 2020-02-26 NOTE — TOC Transition Note (Signed)
Transition of Care [facility]) - CM/SW Discharge Note   Patient Details  Name: Rashika Bettes MRN: [id number] Date of Birth: 08-31-1951  Transition of Care [facility]) CM/SW Contact:  Alberteen Sam, LCSW Phone Number: 02/26/2020, 3:41 PM   Clinical Narrative:     Patient ready to discharge home with hospice, [place] reports family ready to accept patient at home and all necessary equipment to be delivered by this evening to home.   [facility] has been called, transport forms have been placed on chart. Daughter [name] called and informed of dc, no further needs at this time.    Final next level of care: Home w Hospice Care Barriers to Discharge: No Barriers Identified   Patient Goals and CMS Choice Patient states their goals for this hospitalization and ongoing recovery are:: to go home CMS Medicare.gov Compare Post Acute Care list provided to:: Patient Represenative (must comment)(daughter [facility]) Choice offered to / list presented to : Adult Children  Discharge Placement                Patient to be transferred to facility by: [place] Name of family member notified: [place] Patient and family notified of of transfer: 02/26/20  Discharge Plan and Services     Post Acute Care Choice: Hospice          DME Arranged: 3-N-1, Hospital bed, Lightweight manual wheelchair with seat cushion(hoyer) DME Agency: [employer] Date DME Agency Contacted: 02/22/20 Time DME Agency Contacted: 3536 Representative spoke with at DME Agency: [place]: RN, PT [place] Agency: [place] Date [place]: 02/15/20 Time [place]: 1332 Representative spoke with at [place]: [place] ([place]) Interventions     Readmission Risk Interventions No flowsheet data found.

## 2020-02-26 NOTE — Progress Notes (Addendum)
PALLIATIVE NOTE:  Received message regarding family's wishes to speak and confusion regarding patient no longer receiving HD at discharge. Daughter [name] also left a message.   I was able to speak with [name], and [facility] via phone.We reviewed goals of care discussions from yesterday regarding comfort, discontinuation of HD, and expressed wishes for home with hospice. Daughters were able to summarize discussion appropriately, however expressed interest in Nephrology's thoughts. Support given. Discussion and education provided again including concerns with patient's inability to tolerate outpatient dialysis and [name]'s quality of life. Daughters verbalized understanding.   Family expressed equipment was diverted due to confusion with receiving from hospice vs. Other agency as well as final decisions. Daughters asked to review differences in palliative versus hospice. Detailed discussion/education provided. Daughters request to continue discussions and follow up will be provided within the next hour for final decisions.   Updates provided to [name] and [name], Renal Navigator who will update [name]..   1258: Follow-up conference call with daughters [name], [name], and [name]). [name] verbalized appreciation of updates and discussion with [name]. Again education provided on outpatient hospice support in the home as requested by family. All daughters mutually verbalized agreement of wishes to proceed with plans of patient discharging home with hospice support once equipment has been delivered and hospice set-up finalized. Support given. Family discussing expectations once patient arrives home. Family aware of 24/7 care needs and changes that could be expected with awareness of communicating concerns and needs to their outpatient team when needed. Daughters verbalized appreciation of support/guidance during their mother's hospital stay.   Time Total: 45 min.    Visit consisted of counseling and education dealing with the complex and emotionally intense issues of symptom management and palliative care in the setting of serious and potentially life-threatening illness.Greater than 50%  of this time was spent counseling and coordinating care related to the above assessment and plan.  [name], AGPCNP-BC  Palliative Medicine Team [phone]

## 2020-02-26 NOTE — Progress Notes (Signed)
Chaplain responded to Spiritual Care consult for delivery of AD.  Checking in with Unit Secretary learned she had delivered an AD to the patient in care of family members.  Unit Secretary will page Spiritual Care if/when it is ready to be notarized.  [place] Chaplain Resident

## 2020-02-26 NOTE — TOC Initial Note (Signed)
Transition of Care [facility]) - Initial/Assessment Note    Patient Details  Name: [name] MRN: [id number] Date of Birth: 1951-05-11  Transition of Care [facility] CM/SW Contact:    Alberteen Sam, LCSW Phone Number: 02/26/2020, 10:30 AM  Clinical Narrative:                  CSW notes plan for discharge is now home with hospice care. CSW reached out to Bradd Canary with [facility] for referral for home hospice and DME needs. Patient will need PTAR home once equipment delivered and ready for dc, will receive last HD treatment today prior to dc.   Expected Discharge Plan: Home w Hospice Care Barriers to Discharge: No Barriers Identified   Patient Goals and CMS Choice Patient states their goals for this hospitalization and ongoing recovery are:: to go home CMS Medicare.gov Compare Post Acute Care list provided to:: Patient Represenative (must comment)([place]av[place] daughter) Choice offered to / list presented to : Patient  Expected Discharge Plan and Services Expected Discharge Plan: [place] Acute Care Choice: Hospice Living arrangements for the past 2 months: Single Family Home Expected Discharge Date: 02/26/20               DME Arranged: 3-N-1, Hospital bed, Lightweight manual wheelchair with seat cushion(hoyer) DME Agency: [employer] Date DME Agency Contacted: 02/22/20 Time DME Agency Contacted: [id number] Representative spoke with at DME Agency: Thedore Mins HH Arranged: RN, PT [place] Agency: [place] Date [place]: 02/15/20 Time [place]: 36 Representative spoke with at [place]: Malachy Mood  Prior Living Arrangements/Services Living arrangements for the past 2 months: [place] with:: Self Patient language and need for interpreter reviewed:: Yes Do you feel safe going back to the place where you live?: Yes   family would like to take patient home now  Need for Family Participation in Patient Care: Yes (Comment) Care giver  support system in place?: Yes (comment)   Criminal Activity/Legal Involvement Pertinent to Current Situation/Hospitalization: No - Comment as needed  Activities of Daily Living Home Assistive Devices/Equipment: Bedside commode/3-in-1, Prosthesis ADL Screening (condition at time of admission) Patient's cognitive ability adequate to safely complete daily activities?: Yes Is the patient deaf or have difficulty hearing?: No Does the patient have difficulty seeing, even when wearing glasses/contacts?: No Does the patient have difficulty concentrating, remembering, or making decisions?: Yes Patient able to express need for assistance with ADLs?: Yes Does the patient have difficulty dressing or bathing?: Yes Independently performs ADLs?: No Communication: Independent Dressing (OT): Needs assistance Is this a change from baseline?: Pre-admission baseline Grooming: Needs assistance Is this a change from baseline?: Pre-admission baseline Feeding: Needs assistance Is this a change from baseline?: Change from baseline, expected to last <3 days Bathing: Needs assistance Is this a change from baseline?: Pre-admission baseline Toileting: Needs assistance Is this a change from baseline?: Pre-admission baseline Walks in Home: Needs assistance Is this a change from baseline?: Pre-admission baseline Does the patient have difficulty walking or climbing stairs?: Yes Weakness of Legs: Both Weakness of Arms/Hands: Both  Permission Sought/Granted Permission sought to share information with : Case Manager, Customer service manager, Family Supports Permission granted to share information with : Yes, Verbal Permission Granted  Share Information with NAME: [place] granted to share info w AGENCY: Hospice  Permission granted to share info w Relationship: daughter  Permission granted to share info w Contact Information: [phone]  Emotional Assessment Appearance::  Appears stated  age Attitude/Demeanor/Rapport: Gracious Affect (typically observed): Calm Orientation: : Oriented to Situation, Oriented to Place, Oriented to Self Alcohol / Substance Use: Not Applicable Psych Involvement: No (comment)  Admission diagnosis:  Fall [W19.XXXA] Cellulitis of right leg [W38.937] Cellulitis of leg, right [D42.876] AKI (acute kidney injury) ([place]) [N17.9] Patient Active Problem List   Diagnosis Date Noted  . Malnutrition of moderate degree 02/25/2020  . Pressure injury of skin 02/22/2020  . Acute CVA (cerebrovascular accident) ([place]) 02/18/2020  . Xerostomia   . Advanced care planning/counseling discussion   . Full code status   . Dry skin   . Physical debility   . Palliative care by specialist   . Goals of care, counseling/discussion   . Cellulitis of right leg 02/09/2020  . Thrombocytopenia ([place]) 02/09/2020  . Hyperbilirubinemia 02/09/2020  . AKI (acute kidney injury) ([name]) 08/10/2019  . Hypoglycemia 08/09/2019  . Hyperkalemia 08/09/2019  . Lactic acidosis 08/09/2019  . Hypothermia 08/09/2019  . CVA (cerebral vascular accident) ([place]) 12/19/2016  . S/P BKA (below knee amputation) unilateral ([place]) 08/06/2014  . Chronic respiratory failure with hypoxia ([place]) 08/06/2014  . Essential hypertension 06/23/2014  . Acute on chronic diastolic CHF (congestive heart failure) ([place]) 01/12/2014  . UTI (lower urinary tract infection) 01/11/2014  . Anemia in chronic kidney disease 09/25/2013  . Hyponatremia 08/06/2013  . CKD (chronic kidney disease), stage IV ([place]) 08/06/2013  . Severe obesity (BMI >= 40) (H[facility]) 06/22/2013    Class: Chronic  . Peripheral arterial disease: s/p L BKA; Occluded R Pop A, 2 V runnoff 05/01/2013  . Hyperlipidemia LDL goal <70   . Hypertension associated with diabetes ([place])   . Unspecified constipation 04/16/2013  . Type 2 diabetes mellitus with renal manifestations ([place]) 04/16/2013  . Secondary renovascular hypertension, benign 04/09/2013  . Nausea  and vomiting 03/18/2013  . Acute renal failure superimposed on stage 4 chronic kidney disease ([place]) 03/18/2013  . Anemia of chronic disease 03/18/2013  . CVA (cerebral infarction) 03/18/2013  . Non-ketotic hyperosmolar coma ([place]) 03/18/2013  . CAD S/P PCI TO RCA for Inferior STEMI. BMS 03/31/2009  . Status post myocardial infarction of inferior wall 03/31/2009   PCP:  Katherina Mires, MD Pharmacy:   [facility], [place]. [address] [place]. [place] [address] Phone: [phone] Fax: [place], [place] [address] Phone: [phone] Fax: [phone]     Social Determinants of Health (SDOH) Interventions    Readmission Risk Interventions No flowsheet data found.

## 2020-02-26 NOTE — Discharge Summary (Signed)
Physician Discharge Summary [facility]    Patient: [name]                   Admit date: 02/09/2020   DOB: 07-26-1951             Discharge date:02/26/2020/10:14 AM QMV:[id number]                          PCP: Katherina Mires, MD  Disposition: Home with hospice  Recommendations for Outpatient Follow-up:    Follow up: With hospice  Discharge Condition: Stable   Code Status:   Code Status: DNR  Diet recommendation: Regular healthy diet   Discharge Diagnoses:    Principal Problem:   Acute on chronic diastolic CHF (congestive heart failure) ([place]) Active Problems:   Acute renal failure superimposed on stage 4 chronic kidney disease (HCC)   Type 2 diabetes mellitus with renal manifestations ([place])   CAD S/P PCI TO RCA for Inferior STEMI. BMS   CKD (chronic kidney disease), stage IV (HCC)   Anemia in chronic kidney disease   CVA (cerebral vascular accident) ([place])   AKI (acute kidney injury) ([place])   Cellulitis of right leg   Thrombocytopenia (HCC)   Hyperbilirubinemia   Xerostomia   Advanced care planning/counseling discussion   Full code status   Dry skin   Physical debility   Palliative care by specialist   Goals of care, counseling/discussion   Acute CVA (cerebrovascular accident) ([place])   Pressure injury of skin   Malnutrition of moderate degree  History of Present Illness/ Hospital Course [name] Argue Summary:    Narrative:  69 year old female with history of DM-2, CAD s/p BMS to RCA, CVA, thrombocytopenia, diastolic CHF, CKD-5 with LUE aVF placed in 07/2019, PAD s/p left BKA presenting with progressive redness and edema of right lower extremity, and admitted for right lower extremity cellulitis, fluid overload and acute on chronic diastolic CHF. Patient was started on IV vancomycin for cellulitis.   Developed AKI therefore nephrology team consulted initially started on Lasix and gentle hydration.  Renal function failed to improve therefore started on  hemodialysis on 3/27.  Right IJ tunnel catheter placed 02/20/2020.  Nutrition has been a big issue to have her gain her strength.  Nutrition team following, calorie count ongoing.  Despite all efforts patient continued to remain very weak, lethargic she will not be able to handle dialysis as an outpatient.  Due to multiple other comorbidities family has met with the palliative care team, has decided for last hemodialysis to be completed on 02/26/2020, then the patient to be discharged home with hospice.  --------------------------------------------------------------------------------------------------------------------------------  Detailed discharge summary/hospital course:   Goals of care discussion After long discussion with healthcare provider including nephrology the daughter says med with palliative care team.  Where today he agreed to DNR/DNI status.  Also hospice at home.  They agreed for the last hemodialysis to be done on 02/26/2020.  Then be discharged home with hospice..      Acute kidney injury on CKD stage V/ -Started on dialysis on this admission -Getting dialysis via temporary dialysis catheter. -  Nephrology social worker spoke with the family regarding outpatient arrangements, until she gets her strength back it will be difficult to get this done.  -Right IJ tunneled catheter placed 02/20/2020 -Continue dialysis per nephrology team -last hemodialysis 02/26/2020  Anemia of chronic disease/ Normocytic anemia/iron/folate deficiency Thrombocytopenia -Continue folic acid,  -Received 1 unit PRBC 02/18/2020 -H&H remains  low, hemoglobin this morning 7.5 Monitoring H&H closely  Acute on chronic diastolic congestive heart failure, ejection fraction 65% -Volume status maintained with dialysis -Trend daily weight  Right lower extremity cellulitis, resolved -Improved, completed 7 days of doxycycline, ended 6/16   Acute metabolic encephalopathy Acute/subacute CVA, corona  radiata -Combination of uremia/hyperammonia.  No focal neuro deficits.  CT of the head is negative.  TSH, B12 are normal -Lactulose twice daily. -Continue supportive care, avoid sedative medication. -Continue Plavix and statin -Spoke with Dr Cheral Marker on 4/1 who reviewed patient's MRI.  No further input at this time.  Continue current management. -Hemoglobin A1c 6.9, LDL 12  Diabetes mellitus type 2 controlled -Hemoglobin A1c 6.0.  -Continue home regimen    History of peripheral arterial disease status post left-sided BKA -Continue Plavix, Lipitor and Zetia  Coronary artery disease status post bare-metal stent to RCA in 2010 -Continue home cardiac meds  Lack of appetite/poor oral intake Mild protein calorie malnutrition -Continue mirtazapine.  Encourage p.o. intake.  Swallow team following.   Recommending dysphagia 2 diet. -Spoke with dietitian this morning-patient is roughly taking about 48% of her caloric needs.  Nutritional status:  Nutrition Problem: Moderate Malnutrition Etiology: chronic illness(ESRD on HD, CHF) Signs/Symptoms: mild fat depletion, moderate fat depletion, mild muscle depletion, moderate muscle depletion, energy intake < or equal to 75% for > or equal to 1 month Interventions: Nepro shake, MVI, Magic cup     Code Status:  DNR/DNI Family Communication: None at bedside today, on 02/25/2020 plan of care was discussed in detail with both daughters. Disposition Plan:              Patient From= home             Patient Anticipated D/C place= patient is unable to sit and tolerate hemodialysis as an outpatient-met with positive care team, who is agreed to be discharged home with hospice, last hemodialysis for 08/08/2020 per family request.        Discharge Instructions:   Discharge Instructions    Activity as tolerated - No restrictions   Complete by: As directed    Diet - low sodium heart healthy   Complete by: As directed    Discharge  instructions   Complete by: As directed    Follow-up with hospice ASAP   Increase activity slowly   Complete by: As directed        Medication List    STOP taking these medications   atorvastatin 80 MG tablet Commonly known as: LIPITOR   ezetimibe 10 MG tablet Commonly known as: ZETIA   metaxalone 800 MG tablet Commonly known as: SKELAXIN     TAKE these medications   Accu-Chek Aviva Plus test strip Generic drug: glucose blood 1 each by Other route See admin instructions. Use one strip to check glucose before meals and at bedtime.   acetaminophen 325 MG tablet Commonly known as: TYLENOL Take 2 tablets (650 mg total) by mouth every 6 (six) hours as needed for mild pain (or Fever >/= 101).   buPROPion 300 MG 24 hr tablet Commonly known as: WELLBUTRIN XL Take 300 mg by mouth daily.   carvedilol 25 MG tablet Commonly known as: COREG Take 25 mg by mouth every 12 (twelve) hours.   clopidogrel 75 MG tablet Commonly known as: Plavix Take 1 tablet (75 mg total) by mouth daily.   colchicine 0.6 MG tablet Take 0.6 mg by mouth daily.   febuxostat 40 MG tablet Commonly known as: Melburn Popper  Take 40 mg by mouth daily.   feeding supplement (NEPRO CARB STEADY) Liqd Take 237 mLs by mouth 2 (two) times daily between meals.   feeding supplement (PRO-STAT SUGAR FREE 64) Liqd Take 30 mLs by mouth with breakfast, with lunch, and with evening meal.   Fish Oil 1000 MG Caps Take 1,000 mg by mouth 2 (two) times daily.   furosemide 40 MG tablet Commonly known as: LASIX Take 80 mg by mouth 2 (two) times daily.   insulin aspart 100 UNIT/ML injection Commonly known as: NovoLOG Inject 0-9 Units into the skin 3 (three) times daily before meals. 0-9 Units, Subcutaneous, 3 times daily with meals CBG < 70: Implement Hypoglycemia measures CBG 70 - 120: 0 units CBG 121 - 150: 1 unit CBG 151 - 200: 2 units CBG 201 - 250: 3 units CBG 251 - 300: 5 units CBG 301 - 350: 7 units CBG 351 - 400: 9 units  CBG > 400: call MD   insulin lispro 100 UNIT/ML KwikPen Commonly known as: HUMALOG Inject 0-9 Units into the skin 3 (three) times daily.   INSULIN SYRINGE .3CC/31GX5/16" 31G X 5/16" 0.3 ML Misc 1 Act by Does not apply route 4 (four) times daily -  before meals and at bedtime. What changed:   how much to take  how to take this   lactulose 10 GM/15ML solution Commonly known as: CHRONULAC Take 30 mLs (20 g total) by mouth 2 (two) times daily.   mirtazapine 7.5 MG tablet Commonly known as: REMERON Take 1 tablet (7.5 mg total) by mouth at bedtime.   ondansetron 4 MG tablet Commonly known as: ZOFRAN Take 1 tablet (4 mg total) by mouth every 6 (six) hours as needed for nausea.   OXYGEN Inhale 2.5 L into the lungs as needed (Breathing.).      Follow-up Information    [facility] Follow up.   Why: HHRN,HHPT Contact information: [place] [address] [phone]        [facility].   Why: hospital bed with hoyer , w/chair, 3 n 1 Contact information: [address] [place] [address] [phone]          No Known Allergies   Procedures /Studies:   DG Chest 2 View  Result Date: 02/03/2020 CLINICAL DATA:  Chest pain short of breath EXAM: CHEST - 2 VIEW COMPARISON:  10/21/2019, 08/09/2019 FINDINGS: Cardiomegaly with vascular congestion and diffuse interstitial and hazy pulmonary opacities suspicious for edema. There may be small pleural effusions. Aortic atherosclerosis. No pneumothorax. IMPRESSION: Cardiomegaly with vascular congestion and diffuse interstitial and hazy lung opacity suspicious for pulmonary edema. Probable trace pleural effusions. Electronically Signed   By: Donavan Foil M.D.   On: 02/03/2020 17:32   DG Pelvis 1-2 Views  Result Date: 02/09/2020 CLINICAL DATA:  Cellulitis of right lower extremity EXAM: PELVIS - 1-2 VIEW COMPARISON:  None. FINDINGS: There is diffuse osteopenia. Bilateral hip  osteoarthritis is seen with superior joint space loss. No definite fracture seen. There is dense vascular calcifications. IMPRESSION: Diffuse osteopenia, however no definite acute osseous abnormality. Electronically Signed   By: Prudencio Pair M.D.   On: 02/09/2020 02:15   DG Tibia/Fibula Right  Result Date: 02/09/2020 CLINICAL DATA:  Right lower extremity drainage and tenderness EXAM: RIGHT TIBIA AND FIBULA - 2 VIEW COMPARISON:  None. FINDINGS: There is no evidence of fracture or other focal bone lesions. Pretibial soft tissue swelling seen. Dense vascular calcifications are  noted. There is diffuse osteopenia. IMPRESSION: No acute osseous abnormality.  Pretibial soft tissue swelling. Electronically Signed   By: Prudencio Pair M.D.   On: 02/09/2020 02:15   DG Ankle Complete Right  Result Date: 02/09/2020 CLINICAL DATA:  Fall you will itis EXAM: RIGHT ANKLE - COMPLETE 3+ VIEW COMPARISON:  November 20, 2013 FINDINGS: No fracture or dislocation. Cystic erosive type changes seen within the midfoot and partially visualized MTP joint. Hindfoot osteoarthritis is noted at the tibiotalar joint. Calcaneal enthesophytes. Dense vascular calcifications. There is diffuse soft tissue swelling seen in the pretibial region. IMPRESSION: No acute osseous abnormality pretibial soft tissue swelling. Since 2015 erosive type change within the midfoot which could be due to inflammatory/Charcot arthropathy. Electronically Signed   By: Prudencio Pair M.D.   On: 02/09/2020 02:13   CT Head Wo Contrast  Result Date: 02/09/2020 CLINICAL DATA:  Leg cellulitis ,cephalopathy EXAM: CT HEAD WITHOUT CONTRAST TECHNIQUE: Contiguous axial images were obtained from the base of the skull through the vertex without intravenous contrast. COMPARISON:  December 19, 2016 FINDINGS: Brain: No evidence of acute territorial infarction, hemorrhage, hydrocephalus,extra-axial collection or mass lesion/mass effect. There is dilatation the ventricles and sulci  consistent with age-related atrophy. Low-attenuation changes in the deep white matter consistent with small vessel ischemia. Prior left-sided lacunar infarct involving the corona radiata Vascular: No hyperdense vessel or unexpected calcification. Skull: The skull is intact. No fracture or focal lesion identified. Sinuses/Orbits: The visualized paranasal sinuses and mastoid air cells are clear. The orbits and globes intact. Other: None IMPRESSION: No acute intracranial abnormality. Findings consistent with age related atrophy and chronic small vessel ischemia Electronically Signed   By: Prudencio Pair M.D.   On: 02/09/2020 04:59   MR BRAIN WO CONTRAST  Result Date: 02/18/2020 CLINICAL DATA:  Acute metabolic encephalopathy EXAM: MRI HEAD WITHOUT CONTRAST TECHNIQUE: Multiplanar, multiecho pulse sequences of the brain and surrounding structures were obtained without intravenous contrast. COMPARISON:  MRI 12/19/2016 FINDINGS: Brain: There is a small subcentimeter focus of mildly reduced diffusion in the left corona radiata adjacent to a chronic small vessel infarct. Confluent areas of T2 hyperintensity in the supratentorial white matter are nonspecific but may reflect advanced chronic microvascular ischemic changes. Chronic infarcts are identified including involvement of the right occipital lobe and right cerebellum. Scattered small foci of susceptibility hypointensity again identified within the cerebral white matter, left lentiform nucleus, pons, and cerebellum likely reflecting chronic microhemorrhages. Increase compared to the prior study may be on a technical basis. Prominent T2 hypointensity of the basal ganglia likely reflects mineralization or other deposition. Prominence of the ventricles and sulci reflects moderate generalized parenchymal volume loss. Vascular: Major vessel flow voids at the skull base are preserved. Skull and upper cervical spine: Marrow signal is mildly heterogeneous. No aggressive osseous  lesion. Sinuses/Orbits: Paranasal sinuses are aerated. Right lens replacement. Other: Incidental note is made of a partially empty sella facet uremic suture. Mastoid air cells are clear. IMPRESSION: Small acute to subacute infarction of the left corona radiata. Chronic/nonemergent findings detailed above including infarcts, chronic microvascular ischemic changes, and microhemorrhages. Electronically Signed   By: Macy Mis M.D.   On: 02/18/2020 09:50   IR Fluoro Guide CV Line Right  Result Date: 02/12/2020 CLINICAL DATA:  Renal failure, needs venous access for hemodialysis EXAM: EXAM RIGHT IJ CATHETER PLACEMENT UNDER ULTRASOUND AND FLUOROSCOPIC GUIDANCE TECHNIQUE: The procedure, risks (including but not limited to bleeding, infection, organ damage, pneumothorax), benefits, and alternatives were explained to the patient. Questions regarding  the procedure were encouraged and answered. The patient understands and consents to the procedure. Patency of the right IJ vein was confirmed with ultrasound with image documentation. An appropriate skin site was determined. Skin site was marked. Region was prepped using maximum barrier technique including cap and mask, sterile gown, sterile gloves, large sterile sheet, and Chlorhexidine as cutaneous antisepsis. The region was infiltrated locally with 1% lidocaine. Under real-time ultrasound guidance, the right IJ vein was accessed with a 19 gauge needle; the needle tip within the vein was confirmed with ultrasound image documentation. The needle exchanged over a guidewire for vascular dilator which allowed advancement of a 16 cm Trialysis catheter. This was positioned with the tip at the cavoatrial junction. Spot chest radiograph shows good positioning and no pneumothorax. Catheter was flushed and sutured externally with 0-Prolene sutures. Patient tolerated the procedure well. FLUOROSCOPY TIME:  Less than 0.1 minute; 0.97 uGym2 DAP COMPLICATIONS: COMPLICATIONS none  IMPRESSION: 1. Technically successful right IJ Trialysis catheter placement. Electronically Signed   By: Lucrezia Europe M.D.   On: 02/12/2020 16:11   IR US Guide Vasc Access Right  Result Date: 02/12/2020 CLINICAL DATA:  Renal failure, needs venous access for hemodialysis EXAM: EXAM RIGHT IJ CATHETER PLACEMENT UNDER ULTRASOUND AND FLUOROSCOPIC GUIDANCE TECHNIQUE: The procedure, risks (including but not limited to bleeding, infection, organ damage, pneumothorax), benefits, and alternatives were explained to the patient. Questions regarding the procedure were encouraged and answered. The patient understands and consents to the procedure. Patency of the right IJ vein was confirmed with ultrasound with image documentation. An appropriate skin site was determined. Skin site was marked. Region was prepped using maximum barrier technique including cap and mask, sterile gown, sterile gloves, large sterile sheet, and Chlorhexidine as cutaneous antisepsis. The region was infiltrated locally with 1% lidocaine. Under real-time ultrasound guidance, the right IJ vein was accessed with a 19 gauge needle; the needle tip within the vein was confirmed with ultrasound image documentation. The needle exchanged over a guidewire for vascular dilator which allowed advancement of a 16 cm Trialysis catheter. This was positioned with the tip at the cavoatrial junction. Spot chest radiograph shows good positioning and no pneumothorax. Catheter was flushed and sutured externally with 0-Prolene sutures. Patient tolerated the procedure well. FLUOROSCOPY TIME:  Less than 0.1 minute; 3.53 uGym2 DAP COMPLICATIONS: COMPLICATIONS none IMPRESSION: 1. Technically successful right IJ Trialysis catheter placement. Electronically Signed   By: Lucrezia Europe M.D.   On: 02/12/2020 16:11   DG CHEST PORT 1 VIEW  Result Date: 02/20/2020 CLINICAL DATA:  Dialysis catheter placement EXAM: PORTABLE CHEST 1 VIEW COMPARISON:  February 09, 2020 FINDINGS: Central catheter  tip is in the right atrium. No pneumothorax. There is cardiomegaly with pulmonary venous hypertension. There is interstitial edema with small right pleural effusion. No consolidation. No adenopathy evident. There is aortic atherosclerosis. No bone lesions. IMPRESSION: Central catheter tip in right atrium. No pneumothorax. There is cardiomegaly with pulmonary vascular congestion. Small right pleural effusion with interstitial edema suggests underlying degree of congestive heart failure. Aortic Atherosclerosis (ICD10-I70.0). Electronically Signed   By: Lowella Grip III M.D.   On: 02/20/2020 11:01   DG Chest Portable 1 View  Result Date: 02/09/2020 CLINICAL DATA:  Shortness of breath EXAM: PORTABLE CHEST 1 VIEW COMPARISON:  February 03, 2020 FINDINGS: There is cardiomegaly. Diffusely increased interstitial markings are seen throughout both lungs. No acute osseous abnormality. IMPRESSION: Cardiomegaly and interstitial edema. Electronically Signed   By: Prudencio Pair M.D.   On: 02/09/2020 02:11  DG Swallowing Func-Speech Pathology  Result Date: 02/19/2020 Objective Swallowing Evaluation: Type of Study: MBS-Modified Barium Swallow Study  Patient Details Name: Anaka Beazer MRN: [id number] Date of Birth: 11/02/1951 Today's Date: 02/19/2020 Time: SLP Start Time (ACUTE ONLY): 0300 -SLP Stop Time (ACUTE ONLY): 9233 SLP Time Calculation (min) (ACUTE ONLY): 10 min Past Medical History: Past Medical History: Diagnosis Date  Anemia   Arthritis   "right shoulder" (12/19/2016)  CAD S/P percutaneous coronary angioplasty 5/110   status post PCI to the RCA for inferior STEMI  Cervical cancer [facility])   Chronic diastolic heart failure, NYHA class 2 ([place])   Echocardiogram 06/2014:  Technically difficult study. Normal LV size with mild LVH. EF 55-60%. Moderate diastolic dysfunction, normal RV size and function. Likely aortic sclerosis without stenosis  Chronic lower back pain   CKD (chronic kidney disease), stage IV (HCC)   Recent  acute on chronic exacerbation in July 2014  Depression   Diabetes mellitus, type II, insulin dependent ([place])   With complications - CAD, CVA, peripheral ulcer ,  Diabetic foot ulcer ([place])   Diabetic peripheral neuropathy associated with type 2 diabetes mellitus (HCC)   Dry gangrene ([place])   Left second toe; now on Sole of L foot --  s/p L BKA  History of blood transfusion   "w/hysterectomy"   Hyperlipidemia LDL goal <70   Hypertension associated with diabetes ([place])   Obesity, Class III, BMI 40-49.9 (morbid obesity) ([place])   BMI 43; 5' 2',  235 pounds 6.4 ounces  On home oxygen therapy   "2.5L prn; sometimes as little as once/month" (12/19/2016)  PAD (peripheral artery disease) ([place])    -- Most recent Dopplers April 2016 Status post left BKA. Known right PTA occlusion  Pneumonia   "childhood"  ST elevation myocardial infarction (STEMI) of inferior wall ([place])  03/2009  Ostial/proximal RCA occlusion treated with BMS stent  Stroke/cerebrovascular accident ([place]) 03/18/2013; 12/19/2016  a. Right-sided weakness, mostly with balance issues and only mild weakness;; b. Slurring speach -- L Posterior Putamen CVA Past Surgical History: Past Surgical History: Procedure Laterality Date  ABDOMINAL HYSTERECTOMY    AMPUTATION Left 08/07/2013  Procedure: left midfoot amputation;  Surgeon: Marianna Payment, MD;  Location: [facility];  Service: Orthopedics;  Laterality: Left;  AMPUTATION Left 08/09/2013  Procedure: AMPUTATION BELOW KNEE;  Surgeon: Marianna Payment, MD;  Location: [facility];  Service: Orthopedics;  Laterality: Left;  AV FISTULA PLACEMENT Left 12/03/2014  Procedure: ARTERIOVENOUS (AV) FISTULA CREATION;  Surgeon: Angelia Mould, MD;  Location: [facility];  Service: Vascular;  Laterality: Left;  AV FISTULA PLACEMENT Right 08/12/2019  Procedure: ARTERIOVENOUS (AV) FISTULA CREATION;  Surgeon: Marty Heck, MD;  Location: [facility];  Service: Vascular;  Laterality: Right;  CARDIAC CATHETERIZATION  03/31/2009   Proximal RCA thrombotic occlusion (inferior STEMI) other coronaries but in a codominant system  EYE SURGERY    "bleeding; vessels leaking"  [place]  "broke her leg in 2 places when she was young"  IR FLUORO GUIDE CV LINE RIGHT  02/12/2020  IR US GUIDE VASC ACCESS RIGHT  02/12/2020  LEG SURGERY    hole in bone( during Childhood)  LEG TENDON SURGERY Right 2000s  patient fell @ [facility] (PCI-S)  03/31/2009  PTCA , bare-metal stent 3.5 mm x 24 mm ostium of RCA ,EF 55%  TRANSTHORACIC ECHOCARDIOGRAM  06/2014; 12/2016  a. Technically difficult study. Normal LV size with mild LVH. EF 55-60%. Mod - GR 2 DD.  Nl RV size & fxn. ~ Aortic Sclerosis w/ stenosis;; b. LVEF 65-70%, GR 1-2 DD. No RWMA, Mod LA Dilation HPI: Pt is a 69 y/o female admitted 02/09/20 secondary to RLE cellulitis and volume overload. PMH includes CAD s/p stent, DM, CKD, CVA, dCHF, L BKA. Pt had a previous swallow evaluation in 2014 with functional appearing swallow until challenged with large, consecutive boluses. Regular solids and thin liquids via small, single sips recommended at that time. MRI 4/1: Small acute to subacute infarction of the left corona radiata.  Subjective: pt drowsy, initially declines all POs offered but does take a small amount when given encouragement Assessment / Plan / Recommendation CHL IP CLINICAL IMPRESSIONS 02/19/2020 Clinical Impression Pt presents with mild oropharyngeal dysphagia characterized by impaired A-P transport, reduced lingual retraction, impaired bolus cohesion, and a pharyngeal delay. She demonstrated premature spillage to the level of the valleculae, vallecular residue, and occasional transient penetration (PAS 2) with thin liquids. A dyspahgia 2 diet with thin liquids is recommended at this time and SLP will follow pt to ensure diet tolerance.  SLP Visit Diagnosis Dysphagia, oropharyngeal phase (R13.12) Attention and concentration deficit following -- Frontal  lobe and executive function deficit following -- Impact on safety and function Moderate aspiration risk;Risk for inadequate nutrition/hydration   CHL IP TREATMENT RECOMMENDATION 02/19/2020 Treatment Recommendations Therapy as outlined in treatment plan below   Prognosis 02/19/2020 Prognosis for Safe Diet Advancement Fair Barriers to Reach Goals Cognitive deficits Barriers/Prognosis Comment -- CHL IP DIET RECOMMENDATION 02/19/2020 SLP Diet Recommendations Dysphagia 2 (Fine chop) solids;Thin liquid Liquid Administration via Cup;Straw Medication Administration Whole meds with puree Compensations Slow rate;Small sips/bites;Follow solids with liquid Postural Changes Seated upright at 90 degrees;Remain semi-upright after after feeds/meals (Comment)   CHL IP OTHER RECOMMENDATIONS 02/19/2020 Recommended Consults -- Oral Care Recommendations Oral care BID Other Recommendations Have oral suction available   [facility] IP FOLLOW UP RECOMMENDATIONS 02/19/2020 Follow up Recommendations Skilled Nursing facility   [facility] IP FREQUENCY AND DURATION 02/19/2020 Speech Therapy Frequency (ACUTE ONLY) min 2x/week Treatment Duration 2 weeks      [facility] IP ORAL PHASE 02/19/2020 Oral Phase Impaired Oral - Pudding Teaspoon -- Oral - Pudding Cup -- Oral - Honey Teaspoon -- Oral - Honey Cup -- Oral - Nectar Teaspoon -- Oral - Nectar Cup -- Oral - Nectar Straw -- Oral - Thin Teaspoon -- Oral - Thin Cup -- Oral - Thin Straw Premature spillage Oral - Puree Reduced posterior propulsion Oral - Mech Soft Impaired mastication Oral - Regular -- Oral - Multi-Consistency -- Oral - Pill Reduced posterior propulsion Oral Phase - Comment --  [facility] IP PHARYNGEAL PHASE 02/19/2020 Pharyngeal Phase Impaired Pharyngeal- Pudding Teaspoon -- Pharyngeal -- Pharyngeal- Pudding Cup -- Pharyngeal -- Pharyngeal- Honey Teaspoon -- Pharyngeal -- Pharyngeal- Honey Cup -- Pharyngeal -- Pharyngeal- Nectar Teaspoon -- Pharyngeal -- Pharyngeal- Nectar Cup -- Pharyngeal -- Pharyngeal- Nectar Straw --  Pharyngeal -- Pharyngeal- Thin Teaspoon -- Pharyngeal -- Pharyngeal- Thin Cup -- Pharyngeal -- Pharyngeal- Thin Straw Penetration/Aspiration during swallow;Reduced tongue base retraction Pharyngeal Material enters airway, remains ABOVE vocal cords then ejected out Pharyngeal- Puree Reduced tongue base retraction;Pharyngeal residue - valleculae Pharyngeal -- Pharyngeal- Mechanical Soft Reduced tongue base retraction;Pharyngeal residue - valleculae Pharyngeal -- Pharyngeal- Regular -- Pharyngeal -- Pharyngeal- Multi-consistency -- Pharyngeal -- Pharyngeal- Pill -- Pharyngeal -- Pharyngeal Comment --  CHL IP CERVICAL ESOPHAGEAL PHASE 02/19/2020 Cervical Esophageal Phase WFL Pudding Teaspoon -- Pudding Cup -- Honey Teaspoon -- Honey Cup -- Nectar Teaspoon -- Nectar Cup --  Nectar Straw -- Thin Teaspoon -- Thin Cup -- Thin Straw -- Puree -- Mechanical Soft -- Regular -- Multi-consistency -- Pill -- Cervical Esophageal Comment -- Shanika I. Hardin Negus, [place], [place] Office number [phone] Pager [place] 02/19/2020, 2:17 PM              DG Fluoro Guide CV Line-No Report  Result Date: 02/20/2020 Fluoroscopy was utilized by the requesting physician.  No radiographic interpretation.   VAS US DUPLEX DIALYSIS ACCESS (AVF, AVG)  Result Date: 02/10/2020 DIALYSIS ACCESS Reason for Exam: Mechanical complication AVF. Access Type: Brachial-cephalic AVF. Comparison Study: No prior study Performing Technologist: Maudry Mayhew MHA, RDMS, RVT, RDCS  Examination Guidelines: A complete evaluation includes B-mode imaging, spectral Doppler, color Doppler, and power Doppler as needed of all accessible portions of each vessel. Unilateral testing is considered an integral part of a complete examination. Limited examinations for reoccurring indications may be performed as noted.  Findings: +--------------------+----------+-----------------+--------+  AVF                  PSV (cm/s) Flow  Vol (mL/min) Comments  +--------------------+----------+-----------------+--------+  Native artery inflow    260            917                  +--------------------+----------+-----------------+--------+  AVF Anastomosis         257                                 +--------------------+----------+-----------------+--------+  +------------+----------+-------------+----------+-----------------------------+  OUTFLOW VEIN PSV (cm/s) Diameter (cm) Depth (cm)           Describe             +------------+----------+-------------+----------+-----------------------------+  Prox UA         255         0.71         0.35          competing branch         +------------+----------+-------------+----------+-----------------------------+  Mid UA          170         0.68         0.70          competing branch         +------------+----------+-------------+----------+-----------------------------+  Dist UA         504         0.47         0.27    partially-occlusive thrombus                                                         and competing branch       +------------+----------+-------------+----------+-----------------------------+   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 *See table(s) above for measurements and observations.  Diagnosing physician: Ruta Hinds MD Electronically signed by Ruta Hinds MD on 02/10/2020 at 4:03:31 PM.   --------------------------------------------------------------------------------   Final    DG FEMUR, MIN 2 VIEWS RIGHT  Result Date: 02/09/2020 CLINICAL DATA:  Drainage and tenderness EXAM: RIGHT FEMUR 2 VIEWS COMPARISON:  None. FINDINGS: There is no evidence of fracture or other focal bone lesions. There is diffuse osteopenia. Dense vascular calcifications  are noted. Fragmented ossicle seen adjacent to the inferior patellar pole. IMPRESSION: No acute osseous abnormality. Electronically Signed   By: Prudencio Pair M.D.   On: 02/09/2020 02:14   VAS [place] LOWER  EXTREMITY VENOUS (DVT)  Result Date: 02/09/2020  Lower Venous DVTStudy Indications: Swelling, and Edema.  Limitations: Patient pain tolerance, patient positioning. Comparison Study: no prior Performing Technologist: Abram Sander RVS  Examination Guidelines: A complete evaluation includes B-mode imaging, spectral Doppler, color Doppler, and power Doppler as needed of all accessible portions of each vessel. Bilateral testing is considered an integral part of a complete examination. Limited examinations for reoccurring indications may be performed as noted. The reflux portion of the exam is performed with the patient in reverse Trendelenburg.  +---------+---------------+---------+-----------+----------+-------------------+  RIGHT     Compressibility Phasicity Spontaneity Properties Thrombus Aging       +---------+---------------+---------+-----------+----------+-------------------+  CFV       Full            Yes       Yes                                         +---------+---------------+---------+-----------+----------+-------------------+  SFJ       Full                                                                  +---------+---------------+---------+-----------+----------+-------------------+  FV Prox   Full                                                                  +---------+---------------+---------+-----------+----------+-------------------+  FV Mid                    Yes       Yes                    unable to tolerate                                                               compression          +---------+---------------+---------+-----------+----------+-------------------+  FV Distal                 Yes       Yes                    unable to tolerate  compression          +---------+---------------+---------+-----------+----------+-------------------+  PFV       Full                                                                   +---------+---------------+---------+-----------+----------+-------------------+  POP                       Yes       Yes                    unable to tolerate                                                               compression          +---------+---------------+---------+-----------+----------+-------------------+  PTV       Full                                                                  +---------+---------------+---------+-----------+----------+-------------------+  PERO      Full                                                                  +---------+---------------+---------+-----------+----------+-------------------+   +----+---------------+---------+-----------+----------+--------------+  LEFT Compressibility Phasicity Spontaneity Properties Thrombus Aging  +----+---------------+---------+-----------+----------+--------------+  CFV                                                   Not visualized  +----+---------------+---------+-----------+----------+--------------+     Summary: RIGHT: - There is no evidence of deep vein thrombosis in the lower extremity. However, portions of this examination were limited- see technologist comments above.  - No cystic structure found in the popliteal fossa.   *See table(s) above for measurements and observations. Electronically signed by Deitra Mayo MD on 02/09/2020 at 1:08:31 PM.    Final      Subjective:   Patient was seen and examined 02/26/2020, 10:14 AM Patient stable today. No acute distress.  No issues overnight Stable for discharge.  Discharge Exam:    Vitals:   02/25/20 1652 02/25/20 1929 02/25/20 2032 02/26/20 0410  BP: (!) 147/60  (!) 135/57 (!) 160/67  Pulse: 66  64 71  Resp: '16  16 18  ' Temp: 98.5 F (36.9 C)  98.6 F (37 C) 97.7 F (36.5 C)  TempSrc: Oral  Oral Oral  SpO2: 100% 100% 97% 100%  Weight:    89 kg  Height:  General: Pt lying comfortably in bed & appears in no obvious distress.  Complaining of  right arm pain. Cardiovascular: S1 & S2 heard, RRR, S1/S2 +. No murmurs, rubs, gallops or clicks. No JVD or pedal edema. Respiratory: Clear to auscultation without wheezing, rhonchi or crackles. No increased work of breathing. Abdominal:  Non-distended, non-tender & soft. No organomegaly or masses appreciated. Normal bowel sounds heard. CNS: Alert and oriented. No focal deficits. Extremities: no edema, no cyanosis    The results of significant diagnostics from this hospitalization (including imaging, microbiology, ancillary and laboratory) are listed below for reference.      Microbiology:   No results found for this or any previous visit (from the past 240 hour(s)).   Labs:   CBC: Recent Labs  Lab 02/22/20 0528 02/23/20 0410 02/24/20 0407 02/25/20 0430 02/26/20 0439  WBC 8.9 8.0 7.1 7.4 7.8  HGB 8.2* 8.1* 8.0* 7.9* 7.5*  HCT 27.6* 27.3* 26.9* 26.6* 25.2*  MCV 101.1* 100.0 101.1* 100.4* 99.2  PLT 84* 79* 74* 76* 75*   Basic Metabolic Panel: Recent Labs  Lab 02/22/20 0528 02/23/20 0410 02/24/20 0407 02/25/20 0430 02/26/20 0439  NA 140 137 139 139 139  K 4.8 5.1 4.4 5.2* 5.9*  CL 103 100 100 100 100  CO2 '25 26 27 26 25  ' GLUCOSE 240* 230* 245* 196* 129*  BUN 36* 52* 34* 62* 82*  CREATININE 4.09* 4.86* 3.52* 5.01* 6.05*  CALCIUM 8.8* 8.8* 8.4* 8.8* 8.6*  MG 1.8 1.9 1.8 1.9 2.2   Liver Function Tests: No results for input(s): AST, ALT, ALKPHOS, BILITOT, PROT, ALBUMIN in the last 168 hours. BNP (last 3 results) Recent Labs    02/03/20 1537 02/09/20 0123  BNP 1,551.5* 1,476.5*   Cardiac Enzymes: No results for input(s): CKTOTAL, CKMB, CKMBINDEX, TROPONINI in the last 168 hours. CBG: Recent Labs  Lab 02/25/20 0613 02/25/20 1108 02/25/20 1652 02/25/20 2120 02/26/20 0652  GLUCAP 185* 198* 191* 153* 106*  Urinalysis    Component Value Date/Time   COLORURINE YELLOW 08/09/2019 1518   APPEARANCEUR HAZY (A) 08/09/2019 1518   LABSPEC 1.011 08/09/2019 1518     PHURINE 8.0 08/09/2019 1518   GLUCOSEU NEGATIVE 08/09/2019 1518   HGBUR NEGATIVE 08/09/2019 1518   BILIRUBINUR NEGATIVE 08/09/2019 1518   KETONESUR NEGATIVE 08/09/2019 1518   PROTEINUR 30 (A) 08/09/2019 1518   UROBILINOGEN 0.2 08/24/2015 1002   NITRITE NEGATIVE 08/09/2019 1518   LEUKOCYTESUR LARGE (A) 08/09/2019 1518   Pressure Injury 02/22/20 Sacrum Right Stage 2 -  Partial thickness loss of dermis presenting as a shallow open injury with a red, pink wound bed without slough. pink (Active)  02/22/20 1511  Location: Sacrum  Location Orientation: Right  Staging: Stage 2 -  Partial thickness loss of dermis presenting as a shallow open injury with a red, pink wound bed without slough.  Wound Description (Comments): pink  Present on Admission: Yes     Pressure Injury 02/22/20 Heel Right Stage 1 -  Intact skin with non-blanchable redness of a localized area usually over a bony prominence. (Active)  02/22/20 2200  Location: Heel  Location Orientation: Right  Staging: Stage 1 -  Intact skin with non-blanchable redness of a localized area usually over a bony prominence.  Wound Description (Comments):   Present on Admission:      Pressure Injury 02/22/20 Coccyx Mid Stage 2 -  Partial thickness loss of dermis presenting as a shallow open injury with a red, pink wound bed without slough. pink (Active)  02/22/20 1212  Location: Coccyx  Location Orientation: Mid  Staging: Stage 2 -  Partial thickness loss of dermis presenting as a shallow open injury with a red, pink wound bed without slough.  Wound Description (Comments): pink  Present on Admission:        Time coordinating discharge: Over 45 minutes  SIGNED: Deatra James, MD, FACP, [facility]. [facility],  Pager [phone]  If 7PM-7AM, please contact night-coverage [email or web address], Password [facility] 02/26/2020, 10:14 AM

## 2020-02-26 NOTE — Progress Notes (Signed)
Renal Navigator received notification from PMT/A. Pickenpack-Cousar that patient and four children have come to the decision to stop HD and have expressed wishes to take their mother home with Hospice care. This was relayed to Dr. Schertz/Nephrologist, along with their request for their mother to have her last HD today before discharge (treatment bumped from yesterday). Renal Navigator notified [facility] to cancel patient's seat. Renal Navigator then received message from A. Hill/CSW that patient's daughters may be confused about the plan and did not want patient to have her last HD today.  Navigator had conversation with PMT again who followed up with patient's children (see documentation) and relayed to Navigator that patient's children would also like to hear from a Nephrologist regarding the information given to them that patient is unable to tolerate HD in an outpatient setting/poor prognosis. Navigator spoke with Dr. Jonnie Finner to request that he call patient's daughter [name] to discuss. Dr. Jonnie Finner agreeable.  Alphonzo Cruise, [place] Renal Navigator [phone]

## 2020-02-26 NOTE — Progress Notes (Signed)
  Procedure Note   PRE-OPERATIVE DIAGNOSIS:   ESRD.   POST-OPERATIVE DIAGNOSIS:  ESRD.  PROCEDURE:  Removal of R IJ perm cath.   PROVIDER:   Dagoberto Ligas PA-C.  ANESTHESIA:  none.   OPERATIVE PROCEDURE:  The following procedure was performed at the bedside.  The right side of patient's neck and chest was prepped and draped in standard fashion.  The catheter was removed in its entirety with only minimal resistance.  Hemostasis was achieved with local compression for 5-10 minutes.    The patient tolerated the procedure well and did not have any complications.  RECOMMENDATIONS:   Dressing can be removed in 24-48 hours.    Dagoberto Ligas, PA-C [facility] [phone] 02/26/2020  4:51 PM

## 2020-02-26 NOTE — Progress Notes (Signed)
  Speech Language Pathology Treatment: Dysphagia  Patient Details Name: [name] MRN: [id number] DOB: 04/11/1951 Today's Date: 02/26/2020 Time: 9672-8979 SLP Time Calculation (min) (ACUTE ONLY): 11 min  Assessment / Plan / Recommendation Clinical Impression  Pt was seen for dysphagia treatment to assess tolerance of the recommended diet. Pt tolerated dysphagia 2 solids and thin liquids when she was adequately upright. She was initially resistant to repositioning despite education and exhibited coughing when she was too reclined. With additional education, pt was amenable to re-positioning and no subsequent signs of aspiration were noted. Mastication time continues to be prolonged and pt demonstrated mild oral residue which was cleared with liquid wash. SLP will follow pt for one more session but discontinuation of SLP services is imminent.    HPI HPI: Pt is a 69 y/o female admitted 02/09/20 secondary to RLE cellulitis and volume overload. PMH includes CAD s/p stent, DM, CKD, CVA, dCHF, L BKA. Pt had a previous swallow evaluation in 2014 with functional appearing swallow until challenged with large, consecutive boluses. Regular solids and thin liquids via small, single sips recommended at that time. MRI 4/1: Small acute to subacute infarction of the left corona radiata.      SLP Plan  Continue with current plan of care       Recommendations  Diet recommendations: Dysphagia 2 (fine chop);Thin liquid Liquids provided via: Cup;Straw Medication Administration: Whole meds with liquid Supervision: Full supervision/cueing for compensatory strategies Compensations: Slow rate;Small sips/bites;Follow solids with liquid;Other (Comment) Postural Changes and/or Swallow Maneuvers: Seated upright 90 degrees                Oral Care Recommendations: Oral care BID Follow up Recommendations: Skilled Nursing facility SLP Visit Diagnosis: Dysphagia, oropharyngeal phase (R13.12) Plan: Continue with  current plan of care       Garmon Dehn I. Hardin Negus, [place], [place] Office number [phone] Pager [place] 02/26/2020, 10:15 AM

## 2020-02-26 NOTE — Care Management Important Message (Signed)
Important Message  Patient Details  Name: [name] MRN: [id number] Date of Birth: 02-13-1951   Medicare Important Message Given:  Yes     [name] 02/26/2020, 10:11 AM

## 2020-02-26 NOTE — Progress Notes (Signed)
Manufacturing engineer Documentation      Liaison received referral for pt to return home s/p discharge with hospice services. Chart reviewed and pt is eligible for hospice services.     Writer contacted daughters to confirm interest. Explained hospice services and philosophy and answered all questions. Daughters agree to hospice services and stated that pt does need a hospital bed, hoyer lift, oxygen, 3:1, wheelchair and bedside table at home. Denies any further DME needs. DME is on the way to home now.    Pt has an admission visit set up with hospice this evening around 7pm.    Please do not hesitate to outreach with any questions and thank you for the referral.     Freddie Breech, RN  [facility] Liaison  [phone]

## 2020-03-19 DEATH — deceased

## 2020-05-24 ENCOUNTER — Telehealth: Payer: Self-pay

## 2020-05-24 NOTE — Telephone Encounter (Signed)
I have been trying to reach patient since April so schedule for a fistulogram vs ligation/superficialization and unable to reach patient. Mailed a letter on 4/12 as well with no response. Tried to reach back out to patient on 7/1 and notified dialysis that if procedure was still needed to please have patient to call us to schedule.   [name]
